# Patient Record
Sex: Male | Born: 1950 | Race: White | Hispanic: No | Marital: Married | Smoking: Former smoker
Health system: Southern US, Community
[De-identification: ages and names within clinical notes are randomized; demographics above are authoritative.]

## PROBLEM LIST (undated history)

## (undated) DIAGNOSIS — E119 Type 2 diabetes mellitus without complications: Secondary | ICD-10-CM

## (undated) DIAGNOSIS — E039 Hypothyroidism, unspecified: Secondary | ICD-10-CM

## (undated) DIAGNOSIS — F419 Anxiety disorder, unspecified: Secondary | ICD-10-CM

## (undated) DIAGNOSIS — M109 Gout, unspecified: Secondary | ICD-10-CM

## (undated) DIAGNOSIS — E7800 Pure hypercholesterolemia, unspecified: Secondary | ICD-10-CM

## (undated) DIAGNOSIS — G629 Polyneuropathy, unspecified: Secondary | ICD-10-CM

## (undated) HISTORY — PX: ADRENALECTOMY: SHX876

---

## 2012-04-28 DIAGNOSIS — I10 Essential (primary) hypertension: Secondary | ICD-10-CM

## 2012-04-28 DIAGNOSIS — N183 Chronic kidney disease, stage 3 unspecified: Secondary | ICD-10-CM

## 2012-04-28 DIAGNOSIS — D62 Acute posthemorrhagic anemia: Secondary | ICD-10-CM

## 2012-04-28 DIAGNOSIS — K279 Peptic ulcer, site unspecified, unspecified as acute or chronic, without hemorrhage or perforation: Secondary | ICD-10-CM

## 2012-04-28 HISTORY — DX: Chronic kidney disease, stage 3 unspecified: N18.30

## 2012-04-28 HISTORY — DX: Peptic ulcer, site unspecified, unspecified as acute or chronic, without hemorrhage or perforation: K27.9

## 2012-04-28 HISTORY — DX: Essential (primary) hypertension: I10

## 2012-04-28 HISTORY — DX: Acute posthemorrhagic anemia: D62

## 2012-09-12 DIAGNOSIS — E8729 Other acidosis: Secondary | ICD-10-CM | POA: Insufficient documentation

## 2012-09-12 DIAGNOSIS — E872 Acidosis: Secondary | ICD-10-CM | POA: Insufficient documentation

## 2012-09-12 DIAGNOSIS — D62 Acute posthemorrhagic anemia: Secondary | ICD-10-CM | POA: Insufficient documentation

## 2012-09-12 DIAGNOSIS — R000 Tachycardia, unspecified: Secondary | ICD-10-CM | POA: Insufficient documentation

## 2012-09-12 DIAGNOSIS — R197 Diarrhea, unspecified: Secondary | ICD-10-CM | POA: Insufficient documentation

## 2012-09-16 DIAGNOSIS — K279 Peptic ulcer, site unspecified, unspecified as acute or chronic, without hemorrhage or perforation: Secondary | ICD-10-CM | POA: Insufficient documentation

## 2012-09-16 DIAGNOSIS — R319 Hematuria, unspecified: Secondary | ICD-10-CM | POA: Insufficient documentation

## 2016-03-12 DIAGNOSIS — E1140 Type 2 diabetes mellitus with diabetic neuropathy, unspecified: Secondary | ICD-10-CM | POA: Diagnosis not present

## 2016-03-12 DIAGNOSIS — E119 Type 2 diabetes mellitus without complications: Secondary | ICD-10-CM | POA: Diagnosis not present

## 2016-06-09 DIAGNOSIS — E119 Type 2 diabetes mellitus without complications: Secondary | ICD-10-CM | POA: Diagnosis not present

## 2016-06-09 DIAGNOSIS — Z0000 Encounter for general adult medical examination without abnormal findings: Secondary | ICD-10-CM | POA: Diagnosis not present

## 2016-07-23 DIAGNOSIS — N183 Chronic kidney disease, stage 3 (moderate): Secondary | ICD-10-CM | POA: Diagnosis not present

## 2016-09-01 DIAGNOSIS — E039 Hypothyroidism, unspecified: Secondary | ICD-10-CM | POA: Diagnosis not present

## 2016-09-01 DIAGNOSIS — E785 Hyperlipidemia, unspecified: Secondary | ICD-10-CM | POA: Diagnosis not present

## 2016-09-01 DIAGNOSIS — I10 Essential (primary) hypertension: Secondary | ICD-10-CM | POA: Diagnosis not present

## 2016-09-01 DIAGNOSIS — D696 Thrombocytopenia, unspecified: Secondary | ICD-10-CM | POA: Diagnosis not present

## 2016-09-01 DIAGNOSIS — E119 Type 2 diabetes mellitus without complications: Secondary | ICD-10-CM | POA: Diagnosis not present

## 2016-09-29 DIAGNOSIS — R32 Unspecified urinary incontinence: Secondary | ICD-10-CM | POA: Diagnosis not present

## 2016-09-29 DIAGNOSIS — J069 Acute upper respiratory infection, unspecified: Secondary | ICD-10-CM | POA: Diagnosis not present

## 2016-10-10 DIAGNOSIS — N3941 Urge incontinence: Secondary | ICD-10-CM | POA: Diagnosis not present

## 2016-10-10 DIAGNOSIS — I10 Essential (primary) hypertension: Secondary | ICD-10-CM | POA: Diagnosis not present

## 2016-10-10 DIAGNOSIS — Z87891 Personal history of nicotine dependence: Secondary | ICD-10-CM | POA: Diagnosis not present

## 2016-10-10 DIAGNOSIS — N183 Chronic kidney disease, stage 3 (moderate): Secondary | ICD-10-CM | POA: Diagnosis not present

## 2016-10-10 DIAGNOSIS — Z7982 Long term (current) use of aspirin: Secondary | ICD-10-CM | POA: Diagnosis not present

## 2016-10-10 DIAGNOSIS — E785 Hyperlipidemia, unspecified: Secondary | ICD-10-CM | POA: Diagnosis not present

## 2016-10-10 DIAGNOSIS — Z6834 Body mass index (BMI) 34.0-34.9, adult: Secondary | ICD-10-CM | POA: Diagnosis not present

## 2016-10-10 DIAGNOSIS — I129 Hypertensive chronic kidney disease with stage 1 through stage 4 chronic kidney disease, or unspecified chronic kidney disease: Secondary | ICD-10-CM | POA: Diagnosis not present

## 2016-10-10 DIAGNOSIS — E669 Obesity, unspecified: Secondary | ICD-10-CM | POA: Diagnosis not present

## 2016-10-10 DIAGNOSIS — E119 Type 2 diabetes mellitus without complications: Secondary | ICD-10-CM | POA: Diagnosis not present

## 2016-10-10 DIAGNOSIS — E039 Hypothyroidism, unspecified: Secondary | ICD-10-CM | POA: Diagnosis not present

## 2016-10-10 DIAGNOSIS — Z79899 Other long term (current) drug therapy: Secondary | ICD-10-CM | POA: Diagnosis not present

## 2016-10-10 DIAGNOSIS — E1142 Type 2 diabetes mellitus with diabetic polyneuropathy: Secondary | ICD-10-CM | POA: Diagnosis not present

## 2016-10-10 DIAGNOSIS — Z794 Long term (current) use of insulin: Secondary | ICD-10-CM | POA: Diagnosis not present

## 2017-03-13 DIAGNOSIS — Z23 Encounter for immunization: Secondary | ICD-10-CM | POA: Diagnosis not present

## 2017-07-07 DIAGNOSIS — Z0000 Encounter for general adult medical examination without abnormal findings: Secondary | ICD-10-CM | POA: Diagnosis not present

## 2017-07-07 DIAGNOSIS — E785 Hyperlipidemia, unspecified: Secondary | ICD-10-CM | POA: Diagnosis not present

## 2017-07-07 DIAGNOSIS — I10 Essential (primary) hypertension: Secondary | ICD-10-CM | POA: Diagnosis not present

## 2017-07-07 DIAGNOSIS — R5383 Other fatigue: Secondary | ICD-10-CM | POA: Diagnosis not present

## 2017-07-07 DIAGNOSIS — Z6834 Body mass index (BMI) 34.0-34.9, adult: Secondary | ICD-10-CM | POA: Diagnosis not present

## 2017-07-07 DIAGNOSIS — H6121 Impacted cerumen, right ear: Secondary | ICD-10-CM | POA: Diagnosis not present

## 2017-07-07 DIAGNOSIS — Z125 Encounter for screening for malignant neoplasm of prostate: Secondary | ICD-10-CM | POA: Diagnosis not present

## 2017-07-07 DIAGNOSIS — E039 Hypothyroidism, unspecified: Secondary | ICD-10-CM | POA: Diagnosis not present

## 2017-07-07 DIAGNOSIS — F321 Major depressive disorder, single episode, moderate: Secondary | ICD-10-CM | POA: Diagnosis not present

## 2017-07-07 DIAGNOSIS — M109 Gout, unspecified: Secondary | ICD-10-CM | POA: Diagnosis not present

## 2017-07-07 DIAGNOSIS — E1142 Type 2 diabetes mellitus with diabetic polyneuropathy: Secondary | ICD-10-CM | POA: Diagnosis not present

## 2017-07-29 DIAGNOSIS — E291 Testicular hypofunction: Secondary | ICD-10-CM | POA: Diagnosis not present

## 2017-08-05 DIAGNOSIS — R05 Cough: Secondary | ICD-10-CM | POA: Diagnosis not present

## 2017-08-13 DIAGNOSIS — Z0000 Encounter for general adult medical examination without abnormal findings: Secondary | ICD-10-CM | POA: Diagnosis not present

## 2017-08-13 DIAGNOSIS — J209 Acute bronchitis, unspecified: Secondary | ICD-10-CM | POA: Diagnosis not present

## 2017-08-28 DIAGNOSIS — R609 Edema, unspecified: Secondary | ICD-10-CM | POA: Diagnosis not present

## 2017-08-28 DIAGNOSIS — M7661 Achilles tendinitis, right leg: Secondary | ICD-10-CM | POA: Diagnosis not present

## 2017-08-28 DIAGNOSIS — M7662 Achilles tendinitis, left leg: Secondary | ICD-10-CM | POA: Diagnosis not present

## 2017-08-28 DIAGNOSIS — E785 Hyperlipidemia, unspecified: Secondary | ICD-10-CM | POA: Diagnosis not present

## 2017-09-17 DIAGNOSIS — M7989 Other specified soft tissue disorders: Secondary | ICD-10-CM | POA: Diagnosis not present

## 2017-09-17 DIAGNOSIS — M7661 Achilles tendinitis, right leg: Secondary | ICD-10-CM | POA: Diagnosis not present

## 2017-09-17 DIAGNOSIS — M7662 Achilles tendinitis, left leg: Secondary | ICD-10-CM | POA: Diagnosis not present

## 2017-09-28 DIAGNOSIS — S86092A Other specified injury of left Achilles tendon, initial encounter: Secondary | ICD-10-CM | POA: Diagnosis not present

## 2017-09-28 DIAGNOSIS — S86091A Other specified injury of right Achilles tendon, initial encounter: Secondary | ICD-10-CM | POA: Diagnosis not present

## 2017-10-01 DIAGNOSIS — S86091A Other specified injury of right Achilles tendon, initial encounter: Secondary | ICD-10-CM | POA: Diagnosis not present

## 2017-10-01 DIAGNOSIS — S86092A Other specified injury of left Achilles tendon, initial encounter: Secondary | ICD-10-CM | POA: Diagnosis not present

## 2017-10-05 DIAGNOSIS — S86091A Other specified injury of right Achilles tendon, initial encounter: Secondary | ICD-10-CM | POA: Diagnosis not present

## 2017-10-05 DIAGNOSIS — S86092A Other specified injury of left Achilles tendon, initial encounter: Secondary | ICD-10-CM | POA: Diagnosis not present

## 2017-10-08 DIAGNOSIS — S86091A Other specified injury of right Achilles tendon, initial encounter: Secondary | ICD-10-CM | POA: Diagnosis not present

## 2017-10-08 DIAGNOSIS — S86092A Other specified injury of left Achilles tendon, initial encounter: Secondary | ICD-10-CM | POA: Diagnosis not present

## 2017-10-12 DIAGNOSIS — S86091A Other specified injury of right Achilles tendon, initial encounter: Secondary | ICD-10-CM | POA: Diagnosis not present

## 2017-10-12 DIAGNOSIS — S86092A Other specified injury of left Achilles tendon, initial encounter: Secondary | ICD-10-CM | POA: Diagnosis not present

## 2017-10-15 DIAGNOSIS — E291 Testicular hypofunction: Secondary | ICD-10-CM | POA: Diagnosis not present

## 2017-10-15 DIAGNOSIS — S86092A Other specified injury of left Achilles tendon, initial encounter: Secondary | ICD-10-CM | POA: Diagnosis not present

## 2017-10-15 DIAGNOSIS — M7661 Achilles tendinitis, right leg: Secondary | ICD-10-CM | POA: Diagnosis not present

## 2017-10-15 DIAGNOSIS — E1165 Type 2 diabetes mellitus with hyperglycemia: Secondary | ICD-10-CM | POA: Diagnosis not present

## 2017-10-15 DIAGNOSIS — S86091A Other specified injury of right Achilles tendon, initial encounter: Secondary | ICD-10-CM | POA: Diagnosis not present

## 2017-10-15 DIAGNOSIS — I10 Essential (primary) hypertension: Secondary | ICD-10-CM | POA: Diagnosis not present

## 2017-10-15 DIAGNOSIS — E039 Hypothyroidism, unspecified: Secondary | ICD-10-CM | POA: Diagnosis not present

## 2017-10-15 DIAGNOSIS — E1142 Type 2 diabetes mellitus with diabetic polyneuropathy: Secondary | ICD-10-CM | POA: Diagnosis not present

## 2017-10-15 DIAGNOSIS — Z7984 Long term (current) use of oral hypoglycemic drugs: Secondary | ICD-10-CM | POA: Diagnosis not present

## 2017-10-15 DIAGNOSIS — E785 Hyperlipidemia, unspecified: Secondary | ICD-10-CM | POA: Diagnosis not present

## 2017-10-19 DIAGNOSIS — S86091A Other specified injury of right Achilles tendon, initial encounter: Secondary | ICD-10-CM | POA: Diagnosis not present

## 2017-10-19 DIAGNOSIS — S86092A Other specified injury of left Achilles tendon, initial encounter: Secondary | ICD-10-CM | POA: Diagnosis not present

## 2017-10-20 DIAGNOSIS — E1142 Type 2 diabetes mellitus with diabetic polyneuropathy: Secondary | ICD-10-CM | POA: Diagnosis not present

## 2017-10-20 DIAGNOSIS — M7661 Achilles tendinitis, right leg: Secondary | ICD-10-CM | POA: Diagnosis not present

## 2017-10-20 DIAGNOSIS — E291 Testicular hypofunction: Secondary | ICD-10-CM | POA: Diagnosis not present

## 2017-10-20 DIAGNOSIS — E349 Endocrine disorder, unspecified: Secondary | ICD-10-CM | POA: Diagnosis not present

## 2017-10-20 DIAGNOSIS — E785 Hyperlipidemia, unspecified: Secondary | ICD-10-CM | POA: Diagnosis not present

## 2017-10-20 DIAGNOSIS — E039 Hypothyroidism, unspecified: Secondary | ICD-10-CM | POA: Diagnosis not present

## 2017-10-20 DIAGNOSIS — I10 Essential (primary) hypertension: Secondary | ICD-10-CM | POA: Diagnosis not present

## 2017-10-20 DIAGNOSIS — Z7984 Long term (current) use of oral hypoglycemic drugs: Secondary | ICD-10-CM | POA: Diagnosis not present

## 2017-10-20 DIAGNOSIS — R609 Edema, unspecified: Secondary | ICD-10-CM | POA: Diagnosis not present

## 2017-10-20 DIAGNOSIS — M7662 Achilles tendinitis, left leg: Secondary | ICD-10-CM | POA: Diagnosis not present

## 2017-10-20 DIAGNOSIS — Z6834 Body mass index (BMI) 34.0-34.9, adult: Secondary | ICD-10-CM | POA: Diagnosis not present

## 2017-10-22 DIAGNOSIS — S86091A Other specified injury of right Achilles tendon, initial encounter: Secondary | ICD-10-CM | POA: Diagnosis not present

## 2017-10-22 DIAGNOSIS — S86092A Other specified injury of left Achilles tendon, initial encounter: Secondary | ICD-10-CM | POA: Diagnosis not present

## 2017-10-27 DIAGNOSIS — S86091A Other specified injury of right Achilles tendon, initial encounter: Secondary | ICD-10-CM | POA: Diagnosis not present

## 2017-10-27 DIAGNOSIS — S86092A Other specified injury of left Achilles tendon, initial encounter: Secondary | ICD-10-CM | POA: Diagnosis not present

## 2017-11-02 DIAGNOSIS — S86091A Other specified injury of right Achilles tendon, initial encounter: Secondary | ICD-10-CM | POA: Diagnosis not present

## 2017-11-02 DIAGNOSIS — S86092A Other specified injury of left Achilles tendon, initial encounter: Secondary | ICD-10-CM | POA: Diagnosis not present

## 2017-11-06 DIAGNOSIS — S86091A Other specified injury of right Achilles tendon, initial encounter: Secondary | ICD-10-CM | POA: Diagnosis not present

## 2017-11-06 DIAGNOSIS — S86092A Other specified injury of left Achilles tendon, initial encounter: Secondary | ICD-10-CM | POA: Diagnosis not present

## 2017-11-10 DIAGNOSIS — S86091A Other specified injury of right Achilles tendon, initial encounter: Secondary | ICD-10-CM | POA: Diagnosis not present

## 2017-11-10 DIAGNOSIS — S86092A Other specified injury of left Achilles tendon, initial encounter: Secondary | ICD-10-CM | POA: Diagnosis not present

## 2017-11-12 DIAGNOSIS — S86091A Other specified injury of right Achilles tendon, initial encounter: Secondary | ICD-10-CM | POA: Diagnosis not present

## 2017-11-12 DIAGNOSIS — S86092A Other specified injury of left Achilles tendon, initial encounter: Secondary | ICD-10-CM | POA: Diagnosis not present

## 2017-11-17 DIAGNOSIS — S86091A Other specified injury of right Achilles tendon, initial encounter: Secondary | ICD-10-CM | POA: Diagnosis not present

## 2017-11-17 DIAGNOSIS — S86092A Other specified injury of left Achilles tendon, initial encounter: Secondary | ICD-10-CM | POA: Diagnosis not present

## 2017-11-19 DIAGNOSIS — S86091A Other specified injury of right Achilles tendon, initial encounter: Secondary | ICD-10-CM | POA: Diagnosis not present

## 2017-11-19 DIAGNOSIS — S86092A Other specified injury of left Achilles tendon, initial encounter: Secondary | ICD-10-CM | POA: Diagnosis not present

## 2017-11-24 DIAGNOSIS — S86091A Other specified injury of right Achilles tendon, initial encounter: Secondary | ICD-10-CM | POA: Diagnosis not present

## 2017-11-24 DIAGNOSIS — S86092A Other specified injury of left Achilles tendon, initial encounter: Secondary | ICD-10-CM | POA: Diagnosis not present

## 2017-11-26 DIAGNOSIS — S86092A Other specified injury of left Achilles tendon, initial encounter: Secondary | ICD-10-CM | POA: Diagnosis not present

## 2017-11-26 DIAGNOSIS — S86091A Other specified injury of right Achilles tendon, initial encounter: Secondary | ICD-10-CM | POA: Diagnosis not present

## 2017-12-01 DIAGNOSIS — S86091A Other specified injury of right Achilles tendon, initial encounter: Secondary | ICD-10-CM | POA: Diagnosis not present

## 2017-12-01 DIAGNOSIS — S86092A Other specified injury of left Achilles tendon, initial encounter: Secondary | ICD-10-CM | POA: Diagnosis not present

## 2017-12-03 DIAGNOSIS — S86091A Other specified injury of right Achilles tendon, initial encounter: Secondary | ICD-10-CM | POA: Diagnosis not present

## 2017-12-03 DIAGNOSIS — S86092A Other specified injury of left Achilles tendon, initial encounter: Secondary | ICD-10-CM | POA: Diagnosis not present

## 2017-12-08 DIAGNOSIS — S86092A Other specified injury of left Achilles tendon, initial encounter: Secondary | ICD-10-CM | POA: Diagnosis not present

## 2017-12-08 DIAGNOSIS — S86091A Other specified injury of right Achilles tendon, initial encounter: Secondary | ICD-10-CM | POA: Diagnosis not present

## 2017-12-10 DIAGNOSIS — S86091A Other specified injury of right Achilles tendon, initial encounter: Secondary | ICD-10-CM | POA: Diagnosis not present

## 2017-12-10 DIAGNOSIS — S86092A Other specified injury of left Achilles tendon, initial encounter: Secondary | ICD-10-CM | POA: Diagnosis not present

## 2018-01-21 DIAGNOSIS — E119 Type 2 diabetes mellitus without complications: Secondary | ICD-10-CM | POA: Diagnosis not present

## 2018-01-21 DIAGNOSIS — H25013 Cortical age-related cataract, bilateral: Secondary | ICD-10-CM | POA: Diagnosis not present

## 2018-01-21 DIAGNOSIS — H25043 Posterior subcapsular polar age-related cataract, bilateral: Secondary | ICD-10-CM | POA: Diagnosis not present

## 2018-01-21 DIAGNOSIS — H35363 Drusen (degenerative) of macula, bilateral: Secondary | ICD-10-CM | POA: Diagnosis not present

## 2018-03-01 DIAGNOSIS — Z23 Encounter for immunization: Secondary | ICD-10-CM | POA: Diagnosis not present

## 2018-03-16 DIAGNOSIS — Z23 Encounter for immunization: Secondary | ICD-10-CM | POA: Diagnosis not present

## 2018-05-03 DIAGNOSIS — K210 Gastro-esophageal reflux disease with esophagitis: Secondary | ICD-10-CM | POA: Diagnosis not present

## 2018-05-03 DIAGNOSIS — I10 Essential (primary) hypertension: Secondary | ICD-10-CM | POA: Diagnosis not present

## 2018-05-03 DIAGNOSIS — E1140 Type 2 diabetes mellitus with diabetic neuropathy, unspecified: Secondary | ICD-10-CM | POA: Diagnosis not present

## 2018-05-03 DIAGNOSIS — E039 Hypothyroidism, unspecified: Secondary | ICD-10-CM | POA: Diagnosis not present

## 2018-05-03 DIAGNOSIS — F321 Major depressive disorder, single episode, moderate: Secondary | ICD-10-CM | POA: Diagnosis not present

## 2018-05-03 DIAGNOSIS — E1165 Type 2 diabetes mellitus with hyperglycemia: Secondary | ICD-10-CM | POA: Diagnosis not present

## 2018-05-03 DIAGNOSIS — E785 Hyperlipidemia, unspecified: Secondary | ICD-10-CM | POA: Diagnosis not present

## 2018-05-03 DIAGNOSIS — N183 Chronic kidney disease, stage 3 (moderate): Secondary | ICD-10-CM | POA: Diagnosis not present

## 2018-05-03 DIAGNOSIS — Z7984 Long term (current) use of oral hypoglycemic drugs: Secondary | ICD-10-CM | POA: Diagnosis not present

## 2018-05-03 DIAGNOSIS — E1142 Type 2 diabetes mellitus with diabetic polyneuropathy: Secondary | ICD-10-CM | POA: Diagnosis not present

## 2018-05-03 DIAGNOSIS — G4700 Insomnia, unspecified: Secondary | ICD-10-CM | POA: Diagnosis not present

## 2018-11-01 DIAGNOSIS — E291 Testicular hypofunction: Secondary | ICD-10-CM | POA: Diagnosis not present

## 2018-11-01 DIAGNOSIS — I10 Essential (primary) hypertension: Secondary | ICD-10-CM | POA: Diagnosis not present

## 2018-11-01 DIAGNOSIS — E1142 Type 2 diabetes mellitus with diabetic polyneuropathy: Secondary | ICD-10-CM | POA: Diagnosis not present

## 2018-11-01 DIAGNOSIS — E039 Hypothyroidism, unspecified: Secondary | ICD-10-CM | POA: Diagnosis not present

## 2018-11-01 DIAGNOSIS — E785 Hyperlipidemia, unspecified: Secondary | ICD-10-CM | POA: Diagnosis not present

## 2018-11-02 DIAGNOSIS — E291 Testicular hypofunction: Secondary | ICD-10-CM | POA: Diagnosis not present

## 2018-11-02 DIAGNOSIS — E1165 Type 2 diabetes mellitus with hyperglycemia: Secondary | ICD-10-CM | POA: Diagnosis not present

## 2018-11-02 DIAGNOSIS — I10 Essential (primary) hypertension: Secondary | ICD-10-CM | POA: Diagnosis not present

## 2018-11-02 DIAGNOSIS — E039 Hypothyroidism, unspecified: Secondary | ICD-10-CM | POA: Diagnosis not present

## 2018-11-02 DIAGNOSIS — E785 Hyperlipidemia, unspecified: Secondary | ICD-10-CM | POA: Diagnosis not present

## 2018-11-02 DIAGNOSIS — E1142 Type 2 diabetes mellitus with diabetic polyneuropathy: Secondary | ICD-10-CM | POA: Diagnosis not present

## 2018-11-11 DIAGNOSIS — E291 Testicular hypofunction: Secondary | ICD-10-CM | POA: Diagnosis not present

## 2018-12-13 DIAGNOSIS — E291 Testicular hypofunction: Secondary | ICD-10-CM | POA: Diagnosis not present

## 2019-01-13 DIAGNOSIS — Z23 Encounter for immunization: Secondary | ICD-10-CM | POA: Diagnosis not present

## 2019-01-13 DIAGNOSIS — E291 Testicular hypofunction: Secondary | ICD-10-CM | POA: Diagnosis not present

## 2019-02-14 DIAGNOSIS — E291 Testicular hypofunction: Secondary | ICD-10-CM | POA: Diagnosis not present

## 2019-02-27 DIAGNOSIS — Z20828 Contact with and (suspected) exposure to other viral communicable diseases: Secondary | ICD-10-CM | POA: Diagnosis not present

## 2019-02-27 DIAGNOSIS — R05 Cough: Secondary | ICD-10-CM | POA: Diagnosis not present

## 2019-02-27 DIAGNOSIS — R0981 Nasal congestion: Secondary | ICD-10-CM | POA: Diagnosis not present

## 2019-03-17 DIAGNOSIS — E291 Testicular hypofunction: Secondary | ICD-10-CM | POA: Diagnosis not present

## 2019-03-31 DIAGNOSIS — E291 Testicular hypofunction: Secondary | ICD-10-CM | POA: Diagnosis not present

## 2019-05-02 DIAGNOSIS — E1140 Type 2 diabetes mellitus with diabetic neuropathy, unspecified: Secondary | ICD-10-CM | POA: Diagnosis not present

## 2019-05-02 DIAGNOSIS — I10 Essential (primary) hypertension: Secondary | ICD-10-CM | POA: Diagnosis not present

## 2019-05-02 DIAGNOSIS — F321 Major depressive disorder, single episode, moderate: Secondary | ICD-10-CM | POA: Diagnosis not present

## 2019-05-02 DIAGNOSIS — E785 Hyperlipidemia, unspecified: Secondary | ICD-10-CM | POA: Diagnosis not present

## 2019-05-02 DIAGNOSIS — Z794 Long term (current) use of insulin: Secondary | ICD-10-CM | POA: Diagnosis not present

## 2019-05-02 DIAGNOSIS — G4700 Insomnia, unspecified: Secondary | ICD-10-CM | POA: Diagnosis not present

## 2019-05-02 DIAGNOSIS — E291 Testicular hypofunction: Secondary | ICD-10-CM | POA: Diagnosis not present

## 2019-05-02 DIAGNOSIS — E1122 Type 2 diabetes mellitus with diabetic chronic kidney disease: Secondary | ICD-10-CM | POA: Diagnosis not present

## 2019-05-20 DIAGNOSIS — E291 Testicular hypofunction: Secondary | ICD-10-CM | POA: Diagnosis not present

## 2019-06-03 DIAGNOSIS — M79604 Pain in right leg: Secondary | ICD-10-CM | POA: Diagnosis not present

## 2019-06-03 DIAGNOSIS — Z1211 Encounter for screening for malignant neoplasm of colon: Secondary | ICD-10-CM | POA: Diagnosis not present

## 2019-06-08 ENCOUNTER — Emergency Department (HOSPITAL_BASED_OUTPATIENT_CLINIC_OR_DEPARTMENT_OTHER): Payer: Medicare Other

## 2019-06-08 ENCOUNTER — Other Ambulatory Visit (HOSPITAL_BASED_OUTPATIENT_CLINIC_OR_DEPARTMENT_OTHER): Payer: Self-pay | Admitting: Family Medicine

## 2019-06-08 ENCOUNTER — Ambulatory Visit (HOSPITAL_BASED_OUTPATIENT_CLINIC_OR_DEPARTMENT_OTHER)
Admission: RE | Admit: 2019-06-08 | Discharge: 2019-06-08 | Disposition: A | Discharge status: Home / Self Care | Payer: Medicare Other | Source: Ambulatory Visit | Attending: Family Medicine | Admitting: Family Medicine

## 2019-06-08 ENCOUNTER — Encounter (HOSPITAL_BASED_OUTPATIENT_CLINIC_OR_DEPARTMENT_OTHER): Payer: Self-pay

## 2019-06-08 ENCOUNTER — Inpatient Hospital Stay (HOSPITAL_BASED_OUTPATIENT_CLINIC_OR_DEPARTMENT_OTHER)
Admission: EM | Admit: 2019-06-08 | Discharge: 2019-06-10 | DRG: 300 | Disposition: A | Discharge status: Home / Self Care | Payer: Medicare Other | Attending: Family Medicine | Admitting: Family Medicine

## 2019-06-08 ENCOUNTER — Other Ambulatory Visit: Payer: Self-pay

## 2019-06-08 DIAGNOSIS — Z885 Allergy status to narcotic agent status: Secondary | ICD-10-CM

## 2019-06-08 DIAGNOSIS — I10 Essential (primary) hypertension: Secondary | ICD-10-CM | POA: Diagnosis present

## 2019-06-08 DIAGNOSIS — Z20822 Contact with and (suspected) exposure to covid-19: Secondary | ICD-10-CM | POA: Diagnosis present

## 2019-06-08 DIAGNOSIS — Z9089 Acquired absence of other organs: Secondary | ICD-10-CM | POA: Diagnosis not present

## 2019-06-08 DIAGNOSIS — M79604 Pain in right leg: Secondary | ICD-10-CM | POA: Diagnosis not present

## 2019-06-08 DIAGNOSIS — E039 Hypothyroidism, unspecified: Secondary | ICD-10-CM | POA: Diagnosis present

## 2019-06-08 DIAGNOSIS — E785 Hyperlipidemia, unspecified: Secondary | ICD-10-CM | POA: Diagnosis present

## 2019-06-08 DIAGNOSIS — E1140 Type 2 diabetes mellitus with diabetic neuropathy, unspecified: Secondary | ICD-10-CM | POA: Diagnosis present

## 2019-06-08 DIAGNOSIS — D582 Other hemoglobinopathies: Secondary | ICD-10-CM | POA: Diagnosis present

## 2019-06-08 DIAGNOSIS — L97519 Non-pressure chronic ulcer of other part of right foot with unspecified severity: Secondary | ICD-10-CM | POA: Diagnosis present

## 2019-06-08 DIAGNOSIS — N1831 Chronic kidney disease, stage 3a: Secondary | ICD-10-CM | POA: Diagnosis present

## 2019-06-08 DIAGNOSIS — N183 Chronic kidney disease, stage 3 unspecified: Secondary | ICD-10-CM | POA: Diagnosis present

## 2019-06-08 DIAGNOSIS — Z832 Family history of diseases of the blood and blood-forming organs and certain disorders involving the immune mechanism: Secondary | ICD-10-CM

## 2019-06-08 DIAGNOSIS — Z7982 Long term (current) use of aspirin: Secondary | ICD-10-CM

## 2019-06-08 DIAGNOSIS — I82411 Acute embolism and thrombosis of right femoral vein: Secondary | ICD-10-CM | POA: Diagnosis not present

## 2019-06-08 DIAGNOSIS — Z6834 Body mass index (BMI) 34.0-34.9, adult: Secondary | ICD-10-CM | POA: Diagnosis not present

## 2019-06-08 DIAGNOSIS — E669 Obesity, unspecified: Secondary | ICD-10-CM | POA: Diagnosis present

## 2019-06-08 DIAGNOSIS — E11621 Type 2 diabetes mellitus with foot ulcer: Secondary | ICD-10-CM | POA: Diagnosis present

## 2019-06-08 DIAGNOSIS — B353 Tinea pedis: Secondary | ICD-10-CM | POA: Diagnosis not present

## 2019-06-08 DIAGNOSIS — Z833 Family history of diabetes mellitus: Secondary | ICD-10-CM

## 2019-06-08 DIAGNOSIS — E1122 Type 2 diabetes mellitus with diabetic chronic kidney disease: Secondary | ICD-10-CM | POA: Diagnosis present

## 2019-06-08 DIAGNOSIS — E11628 Type 2 diabetes mellitus with other skin complications: Secondary | ICD-10-CM | POA: Diagnosis present

## 2019-06-08 DIAGNOSIS — Z03818 Encounter for observation for suspected exposure to other biological agents ruled out: Secondary | ICD-10-CM | POA: Diagnosis not present

## 2019-06-08 DIAGNOSIS — I129 Hypertensive chronic kidney disease with stage 1 through stage 4 chronic kidney disease, or unspecified chronic kidney disease: Secondary | ICD-10-CM | POA: Diagnosis present

## 2019-06-08 DIAGNOSIS — Z87891 Personal history of nicotine dependence: Secondary | ICD-10-CM | POA: Diagnosis not present

## 2019-06-08 DIAGNOSIS — M109 Gout, unspecified: Secondary | ICD-10-CM | POA: Diagnosis present

## 2019-06-08 DIAGNOSIS — E1129 Type 2 diabetes mellitus with other diabetic kidney complication: Secondary | ICD-10-CM | POA: Diagnosis not present

## 2019-06-08 DIAGNOSIS — Z7984 Long term (current) use of oral hypoglycemic drugs: Secondary | ICD-10-CM

## 2019-06-08 DIAGNOSIS — M79671 Pain in right foot: Secondary | ICD-10-CM | POA: Diagnosis not present

## 2019-06-08 DIAGNOSIS — I824Z1 Acute embolism and thrombosis of unspecified deep veins of right distal lower extremity: Secondary | ICD-10-CM | POA: Diagnosis present

## 2019-06-08 DIAGNOSIS — L0390 Cellulitis, unspecified: Secondary | ICD-10-CM | POA: Diagnosis present

## 2019-06-08 DIAGNOSIS — L03115 Cellulitis of right lower limb: Secondary | ICD-10-CM | POA: Diagnosis not present

## 2019-06-08 DIAGNOSIS — D696 Thrombocytopenia, unspecified: Secondary | ICD-10-CM | POA: Diagnosis present

## 2019-06-08 DIAGNOSIS — E876 Hypokalemia: Secondary | ICD-10-CM | POA: Diagnosis not present

## 2019-06-08 DIAGNOSIS — L089 Local infection of the skin and subcutaneous tissue, unspecified: Secondary | ICD-10-CM | POA: Diagnosis not present

## 2019-06-08 DIAGNOSIS — F411 Generalized anxiety disorder: Secondary | ICD-10-CM | POA: Diagnosis present

## 2019-06-08 DIAGNOSIS — I82431 Acute embolism and thrombosis of right popliteal vein: Secondary | ICD-10-CM | POA: Diagnosis not present

## 2019-06-08 DIAGNOSIS — Z79899 Other long term (current) drug therapy: Secondary | ICD-10-CM

## 2019-06-08 DIAGNOSIS — Z79890 Hormone replacement therapy (postmenopausal): Secondary | ICD-10-CM

## 2019-06-08 HISTORY — DX: Type 2 diabetes mellitus without complications: E11.9

## 2019-06-08 HISTORY — DX: Polyneuropathy, unspecified: G62.9

## 2019-06-08 HISTORY — DX: Anxiety disorder, unspecified: F41.9

## 2019-06-08 HISTORY — DX: Gout, unspecified: M10.9

## 2019-06-08 HISTORY — DX: Pure hypercholesterolemia, unspecified: E78.00

## 2019-06-08 LAB — CBC WITH DIFFERENTIAL/PLATELET
Abs Immature Granulocytes: 0.03 10*3/uL (ref 0.00–0.07)
Basophils Absolute: 0 10*3/uL (ref 0.0–0.1)
Basophils Relative: 1 %
Eosinophils Absolute: 0.1 10*3/uL (ref 0.0–0.5)
Eosinophils Relative: 2 %
HCT: 53.7 % — ABNORMAL HIGH (ref 39.0–52.0)
Hemoglobin: 18.2 g/dL — ABNORMAL HIGH (ref 13.0–17.0)
Immature Granulocytes: 0 %
Lymphocytes Relative: 15 %
Lymphs Abs: 1.1 10*3/uL (ref 0.7–4.0)
MCH: 32.7 pg (ref 26.0–34.0)
MCHC: 33.9 g/dL (ref 30.0–36.0)
MCV: 96.6 fL (ref 80.0–100.0)
Monocytes Absolute: 0.6 10*3/uL (ref 0.1–1.0)
Monocytes Relative: 9 %
Neutro Abs: 5.2 10*3/uL (ref 1.7–7.7)
Neutrophils Relative %: 73 %
Platelets: 123 10*3/uL — ABNORMAL LOW (ref 150–400)
RBC: 5.56 MIL/uL (ref 4.22–5.81)
RDW: 13.2 % (ref 11.5–15.5)
WBC: 7.1 10*3/uL (ref 4.0–10.5)
nRBC: 0 % (ref 0.0–0.2)

## 2019-06-08 LAB — COMPREHENSIVE METABOLIC PANEL
ALT: 19 U/L (ref 0–44)
AST: 22 U/L (ref 15–41)
Albumin: 3.7 g/dL (ref 3.5–5.0)
Alkaline Phosphatase: 46 U/L (ref 38–126)
Anion gap: 10 (ref 5–15)
BUN: 20 mg/dL (ref 8–23)
CO2: 27 mmol/L (ref 22–32)
Calcium: 9 mg/dL (ref 8.9–10.3)
Chloride: 100 mmol/L (ref 98–111)
Creatinine, Ser: 1.54 mg/dL — ABNORMAL HIGH (ref 0.61–1.24)
GFR calc Af Amer: 53 mL/min — ABNORMAL LOW (ref >60)
GFR calc non Af Amer: 45 mL/min — ABNORMAL LOW (ref >60)
Glucose, Bld: 125 mg/dL — ABNORMAL HIGH (ref 70–99)
Potassium: 3.8 mmol/L (ref 3.5–5.1)
Sodium: 137 mmol/L (ref 135–145)
Total Bilirubin: 1.7 mg/dL — ABNORMAL HIGH (ref 0.3–1.2)
Total Protein: 7.2 g/dL (ref 6.5–8.1)

## 2019-06-08 LAB — RESPIRATORY PANEL BY RT PCR (FLU A&B, COVID)
Influenza A by PCR: NEGATIVE
Influenza B by PCR: NEGATIVE
SARS Coronavirus 2 by RT PCR: NEGATIVE

## 2019-06-08 LAB — LACTIC ACID, PLASMA: Lactic Acid, Venous: 1.7 mmol/L (ref 0.5–1.9)

## 2019-06-08 MED ORDER — PIPERACILLIN-TAZOBACTAM 3.375 G IVPB 30 MIN
3.3750 g | Freq: Once | INTRAVENOUS | Status: AC
  Administered 2019-06-08: 3.375 g via INTRAVENOUS
  Filled 2019-06-08 (×2): qty 50

## 2019-06-08 MED ORDER — HEPARIN (PORCINE) 25000 UT/250ML-% IV SOLN
1800.0000 [IU]/h | INTRAVENOUS | Status: DC
  Administered 2019-06-08 – 2019-06-09 (×2): 1800 [IU]/h via INTRAVENOUS
  Filled 2019-06-08 (×2): qty 250

## 2019-06-08 MED ORDER — HEPARIN BOLUS VIA INFUSION
7000.0000 [IU] | Freq: Once | INTRAVENOUS | Status: AC
  Administered 2019-06-08: 7000 [IU] via INTRAVENOUS

## 2019-06-08 MED ORDER — VANCOMYCIN HCL 1250 MG/250ML IV SOLN
1250.0000 mg | INTRAVENOUS | Status: DC
  Administered 2019-06-09: 1250 mg via INTRAVENOUS
  Filled 2019-06-08 (×2): qty 250

## 2019-06-08 MED ORDER — VANCOMYCIN HCL IN DEXTROSE 1-5 GM/200ML-% IV SOLN
1000.0000 mg | Freq: Once | INTRAVENOUS | Status: AC
  Administered 2019-06-08: 1000 mg via INTRAVENOUS
  Filled 2019-06-08: qty 200

## 2019-06-08 NOTE — ED Triage Notes (Signed)
Pt brought from xray-DVT right LE-NAD-steady gait

## 2019-06-08 NOTE — ED Provider Notes (Signed)
[facility] EMERGENCY DEPARTMENT Provider Note   CSN: [id number] Arrival date & time: 06/08/19  2011     History Chief Complaint  Patient presents with  . DVT right LE    Jonathon [name] is a 69 y.o. male.  The history is provided by the patient and medical records. No language interpreter was used.  Leg Pain Location:  Foot Injury: no   Foot location:  R foot Pain details:    Quality:  Aching   Radiates to:  Does not radiate   Severity:  Moderate   Onset quality:  Gradual   Timing:  Constant   Progression:  Unchanged Chronicity:  New Tetanus status:  Unknown Prior injury to area:  No Relieved by:  Nothing Worsened by:  Nothing Ineffective treatments:  None tried Associated symptoms: no back pain, no fatigue, no fever and no neck pain   Risk factors: obesity        Past Medical History:  Diagnosis Date  . Anxiety   . Diabetes mellitus without complication ([place])   . Gout   . High cholesterol   . Neuropathy   . Thyroid disease     There are no problems to display for this patient.   Past Surgical History:  Procedure Laterality Date  . ADRENALECTOMY         No family history on file.  Social History   Tobacco Use  . Smoking status: Former Research scientist (life sciences)  . Smokeless tobacco: Never Used  Substance Use Topics  . Alcohol use: Yes    Comment: daily  . Drug use: Never    Home Medications Prior to Admission medications   Not on File    Allergies    Morphine and related  Review of Systems   Review of Systems  Constitutional: Negative for chills, diaphoresis, fatigue and fever.  HENT: Negative for congestion.   Respiratory: Negative for cough, chest tightness, shortness of breath and wheezing.   Cardiovascular: Positive for leg swelling. Negative for chest pain and palpitations.  Gastrointestinal: Negative for abdominal pain, constipation, diarrhea, nausea and vomiting.  Genitourinary: Negative for dysuria and flank pain.    Musculoskeletal: Negative for back pain, neck pain and neck stiffness.  Skin: Positive for rash and wound.  Neurological: Negative for light-headedness and headaches.  Psychiatric/Behavioral: Negative for agitation and confusion.  All other systems reviewed and are negative.   Physical Exam Updated Vital Signs BP (!) 164/108 (BP Location: Left Arm)   Pulse 97   Temp 98.7 F (37.1 C) (Oral)   Resp 18   Ht 6' (1.829 m)   Wt 114.3 kg   SpO2 100%   BMI 34.18 kg/m   Physical Exam Vitals and nursing note reviewed.  Constitutional:      General: He is not in acute distress.    Appearance: He is well-developed. He is not ill-appearing, toxic-appearing or diaphoretic.  HENT:     Head: Normocephalic and atraumatic.     Nose: Nose normal.     Mouth/Throat:     Mouth: Mucous membranes are moist.     Pharynx: No oropharyngeal exudate or posterior oropharyngeal erythema.  Eyes:     Conjunctiva/sclera: Conjunctivae normal.  Cardiovascular:     Rate and Rhythm: Normal rate and regular rhythm.     Heart sounds: No murmur.  Pulmonary:     Effort: Pulmonary effort is normal. No respiratory distress.     Breath sounds: Normal breath sounds. No wheezing, rhonchi or rales.  Chest:  Chest wall: No tenderness.  Abdominal:     General: Abdomen is flat.     Palpations: Abdomen is soft.     Tenderness: There is no abdominal tenderness. There is no right CVA tenderness or left CVA tenderness.  Musculoskeletal:        General: Tenderness present. No signs of injury.     Cervical back: Neck supple.     Right lower leg: Edema present.  Skin:    General: Skin is warm and dry.     Capillary Refill: Capillary refill takes less than 2 seconds.     Findings: Erythema and rash present.  Neurological:     General: No focal deficit present.     Mental Status: He is alert.  Psychiatric:        Mood and Affect: Mood normal.            ED Results / Procedures / Treatments   Labs (all  labs ordered are listed, but only abnormal results are displayed) Labs Reviewed  CBC WITH DIFFERENTIAL/PLATELET - Abnormal; Notable for the following components:      Result Value   Hemoglobin 18.2 (*)    HCT 53.7 (*)    Platelets 123 (*)    All other components within normal limits  COMPREHENSIVE METABOLIC PANEL - Abnormal; Notable for the following components:   Glucose, Bld 125 (*)    Creatinine, Ser 1.54 (*)    Total Bilirubin 1.7 (*)    GFR calc non Af Amer 45 (*)    GFR calc Af Amer 53 (*)    All other components within normal limits  RESPIRATORY PANEL BY RT PCR (FLU A&B, COVID)  CULTURE, BLOOD (ROUTINE X 2)  CULTURE, BLOOD (ROUTINE X 2)  LACTIC ACID, PLASMA  LACTIC ACID, PLASMA  CBC  HEPARIN LEVEL (UNFRACTIONATED)    EKG None    Radiology US Venous Img Lower Unilateral Right (DVT)  Addendum Date: 06/08/2019   ADDENDUM REPORT: 06/08/2019 20:06 ADDENDUM: Multiple attempts were made to contact the referring provider office without success. The patient was instructed to go to the emergency room for further evaluation. Electronically Signed   By: Audie Pinto M.D.   On: 06/08/2019 20:06   Result Date: 06/08/2019 CLINICAL DATA:  Right lower extremity pain for 1 week. EXAM: RIGHT LOWER EXTREMITY VENOUS DOPPLER ULTRASOUND TECHNIQUE: Gray-scale sonography with compression, as well as color and duplex ultrasound, were performed to evaluate the deep venous system(s) from the level of the common femoral vein through the popliteal and proximal calf veins. COMPARISON:  None. FINDINGS: VENOUS There is incomplete compression of the distal femoral vein. There is evidence of nonocclusive thrombus on grayscale images in the distal femoral vein. There is normal compressibility of the common femoral, proximal to mid femoral vein, superficial femoral, and popliteal veins, as well as the visualized calf veins. Visualized portions of profunda femoral vein and great saphenous vein unremarkable.  Doppler waveforms show normal direction of venous flow, normal respiratory phasicity and response to augmentation. Limited views of the contralateral common femoral vein are unremarkable. OTHER None. Limitations: none IMPRESSION: Nonocclusive thrombus in the distal right femoral vein. The remainder of the deep venous system appears is patent without thrombus. Electronically Signed: By: Audie Pinto M.D. On: 06/08/2019 19:52   DG Foot Complete Right  Result Date: 06/08/2019 CLINICAL DATA:  Diabetes, right foot infection EXAM: RIGHT FOOT COMPLETE - 3+ VIEW COMPARISON:  None. FINDINGS: Frontal, oblique, lateral views of the right foot are obtained.  No acute displaced fractures. Mild osteoarthritis of the first metatarsophalangeal joint. Diffuse vascular calcifications are identified. The soft tissues are otherwise unremarkable. No radiographic evidence of osteomyelitis. IMPRESSION: 1. No acute or destructive bony lesions. Electronically Signed   By: Randa Ngo M.D.   On: 06/08/2019 21:53    Procedures Procedures (including critical care time)  CRITICAL CARE Performed by: Gwenyth Allegra Glennice Marcos Total critical care time: 35 minutes Critical care time was exclusive of separately billable procedures and treating other patients. Critical care was necessary to treat or prevent imminent or life-threatening deterioration. Critical care was time spent personally by me on the following activities: development of treatment plan with patient and/or surrogate as well as nursing, discussions with consultants, evaluation of patient's response to treatment, examination of patient, obtaining history from patient or surrogate, ordering and performing treatments and interventions, ordering and review of laboratory studies, ordering and review of radiographic studies, pulse oximetry and re-evaluation of patient's condition.   Medications Ordered in ED Medications  heparin ADULT infusion 100 units/mL (25000  units/269mL sodium chloride 0.45%) (1,800 Units/hr Intravenous New Bag/Given 06/08/19 2241)  vancomycin (VANCOREADY) IVPB 1250 mg/250 mL (has no administration in time range)  piperacillin-tazobactam (ZOSYN) IVPB 3.375 g (0 g Intravenous Stopped 06/08/19 2224)  vancomycin (VANCOCIN) IVPB 1000 mg/200 mL premix (0 mg Intravenous Stopped 06/08/19 2335)  heparin bolus via infusion 7,000 Units (7,000 Units Intravenous Bolus from Bag 06/08/19 2242)    ED Course  I have reviewed the triage vital signs and the nursing notes.  Pertinent labs & imaging results that were available during my care of the patient were reviewed by me and considered in my medical decision making (see chart for details).    MDM Rules/Calculators/A&P                      [name] is a 69 y.o. male with a past medical history significant for obesity, thyroid disease, diabetic neuropathy, gout, and anxiety who presents for further evaluation of right leg pain and some swelling.  According to patient, for the last week he has had some pain in his right lateral calf.  He saw his PCP today who ordered a DVT ultrasound as well as ordered a prescription for antibiotics for developing cellulitis on the top of the foot.  He reports that while awaiting the ultrasound to be completed and for the results, the infection and erythema on the top of his foot has now spread past the ankle and up to his mid shin.  He reports no significant fevers, chills, chest pain, shortness of breath, nausea, vomiting however.  He denies any other systemic signs of infection but he does report that yesterday his foot was not red whatsoever and now the redness is spreading up his shin.  He is concerned about the infection given the diabetes and peripheral neuropathy.  Patient's ultrasound performed as an outpatient does reveal acute DVT.  On exam, patient has some tenderness in the right foot and evidence of diabetic wound between his toes.  There is  erythema warmth spreading up the leg but there is no crepitance.  Patient does have palpable pulses and his sensation is at his diminished baseline.  Lungs were clear and chest was nontender.  Abdomen was nontender.  Patient is afebrile.  Clinically I am concerned not only about the patient's DVT to be started on anticoagulation but I am concerned about the rapidly worsening cellulitis.  Given the redness spreading from the toes  to nearly the knee within several hours, I feel he needs IV antibiotics and admission for further management.  X-ray was obtained to rule out osteomyelitis and was negative for bony involvement.  Labs were performed and did not show sepsis.  He will be given IV antibiotics and will be admitted.  He was started on heparin for the DVT as well.  Patient admitted to hospitalist service for further management.      Final Clinical Impression(s) / ED Diagnoses Final diagnoses:  Cellulitis in diabetic foot (HCC)  DVT, lower extremity, distal, acute, right (HCC)     Clinical Impression: 1. Cellulitis in diabetic foot ([place])   2. DVT, lower extremity, distal, acute, right ([place])     Disposition: Admit  This note was prepared with assistance of Dragon voice recognition software. Occasional wrong-word or sound-a-like substitutions may have occurred due to the inherent limitations of voice recognition software.      Starsky Nanna, Gwenyth Allegra, MD 06/09/19 [phone]

## 2019-06-08 NOTE — Progress Notes (Addendum)
ANTICOAGULATION/ANTIBIOTIC CONSULT NOTE   Pharmacy Consult for Heparin and Vancomycin Indication: DVT/ Cellulitis   Allergies  Allergen Reactions  . Morphine And Related Itching    Patient Measurements: Height: 6' (182.9 cm) Weight: 252 lb (114.3 kg) IBW/kg (Calculated) : 77.6 Heparin Dosing Weight: 102.2kg  Vital Signs: Temp: 98.7 F (37.1 C) (02/10 2021) Temp Source: Oral (02/10 2021) BP: 158/104 (02/10 2154) Pulse Rate: 97 (02/10 2154)  Labs: Recent Labs    06/08/19 2128  HGB 18.2*  HCT 53.7*  PLT 123*  CREATININE 1.54*    Estimated Creatinine Clearance: 59.1 mL/min (A) (by C-G formula based on SCr of 1.54 mg/dL (H)).   Medical History: Past Medical History:  Diagnosis Date  . Anxiety   . Diabetes mellitus without complication ([place])   . Gout   . High cholesterol   . Neuropathy   . Thyroid disease     Medications:  Scheduled:  . heparin  7,000 Units Intravenous Once   Infusions:  . heparin    . piperacillin-tazobactam 3.375 g (06/08/19 2148)  . vancomycin 1,000 mg (06/08/19 2149)  . [START ON 06/09/2019] vancomycin      Assessment: Patient is a 84 yom that presented to the ED for evaluation of a DVT in his LLE. There was also some concern for cellulitis of the same leg. Pharmacy has been asked to dose heparin and vancomycin in this patient.   Goal of Therapy:  Heparin level 0.3-0.7 units/ml Monitor platelets by anticoagulation protocol: Yes AUC 400-550  Plan:  Anticoagulation  - Heparin bolus of 7000 units IV x 1 dose  - Followed by Heparin drip @ 1800 units/hr  - Heparin level in ~ 6 hours  - Monitor patient for s/s of bleeding while on heparin  - Monitor platelets as on admission was 123, however Hgb was 18 (labs may need repeated)   Antibiotic - No loading dose indicated for cellulitis  - Vancomycin 1000mg  IV x 1 dose given in the ED  - Followed by Vancomycin 1250mg  IV q24h  - Est Calc AUC 491 - Patient has a hx of CKD monitor renal  function and urine output   Duanne Limerick PharmD. BCPS  06/08/2019,10:17 PM

## 2019-06-09 ENCOUNTER — Inpatient Hospital Stay (HOSPITAL_COMMUNITY): Payer: Medicare Other

## 2019-06-09 ENCOUNTER — Encounter (HOSPITAL_COMMUNITY): Payer: Self-pay | Admitting: Internal Medicine

## 2019-06-09 DIAGNOSIS — L03115 Cellulitis of right lower limb: Secondary | ICD-10-CM

## 2019-06-09 DIAGNOSIS — E039 Hypothyroidism, unspecified: Secondary | ICD-10-CM | POA: Diagnosis present

## 2019-06-09 DIAGNOSIS — D582 Other hemoglobinopathies: Secondary | ICD-10-CM | POA: Diagnosis present

## 2019-06-09 DIAGNOSIS — N183 Chronic kidney disease, stage 3 unspecified: Secondary | ICD-10-CM | POA: Diagnosis present

## 2019-06-09 DIAGNOSIS — I10 Essential (primary) hypertension: Secondary | ICD-10-CM | POA: Diagnosis present

## 2019-06-09 DIAGNOSIS — D696 Thrombocytopenia, unspecified: Secondary | ICD-10-CM | POA: Diagnosis present

## 2019-06-09 DIAGNOSIS — E1140 Type 2 diabetes mellitus with diabetic neuropathy, unspecified: Secondary | ICD-10-CM | POA: Diagnosis present

## 2019-06-09 DIAGNOSIS — E1129 Type 2 diabetes mellitus with other diabetic kidney complication: Secondary | ICD-10-CM | POA: Diagnosis present

## 2019-06-09 DIAGNOSIS — L0390 Cellulitis, unspecified: Secondary | ICD-10-CM | POA: Diagnosis present

## 2019-06-09 DIAGNOSIS — M109 Gout, unspecified: Secondary | ICD-10-CM | POA: Diagnosis present

## 2019-06-09 DIAGNOSIS — F411 Generalized anxiety disorder: Secondary | ICD-10-CM | POA: Diagnosis present

## 2019-06-09 DIAGNOSIS — E1122 Type 2 diabetes mellitus with diabetic chronic kidney disease: Secondary | ICD-10-CM | POA: Diagnosis present

## 2019-06-09 DIAGNOSIS — E785 Hyperlipidemia, unspecified: Secondary | ICD-10-CM | POA: Diagnosis present

## 2019-06-09 DIAGNOSIS — I824Z1 Acute embolism and thrombosis of unspecified deep veins of right distal lower extremity: Secondary | ICD-10-CM | POA: Diagnosis present

## 2019-06-09 LAB — CBC WITH DIFFERENTIAL/PLATELET
Abs Immature Granulocytes: 0.02 10*3/uL (ref 0.00–0.07)
Basophils Absolute: 0 10*3/uL (ref 0.0–0.1)
Basophils Relative: 0 %
Eosinophils Absolute: 0.1 10*3/uL (ref 0.0–0.5)
Eosinophils Relative: 2 %
HCT: 52 % (ref 39.0–52.0)
Hemoglobin: 18.4 g/dL — ABNORMAL HIGH (ref 13.0–17.0)
Immature Granulocytes: 0 %
Lymphocytes Relative: 17 %
Lymphs Abs: 1 10*3/uL (ref 0.7–4.0)
MCH: 33.5 pg (ref 26.0–34.0)
MCHC: 35.4 g/dL (ref 30.0–36.0)
MCV: 94.7 fL (ref 80.0–100.0)
Monocytes Absolute: 0.5 10*3/uL (ref 0.1–1.0)
Monocytes Relative: 9 %
Neutro Abs: 4.2 10*3/uL (ref 1.7–7.7)
Neutrophils Relative %: 72 %
Platelets: 113 10*3/uL — ABNORMAL LOW (ref 150–400)
RBC: 5.49 MIL/uL (ref 4.22–5.81)
RDW: 13 % (ref 11.5–15.5)
WBC: 5.8 10*3/uL (ref 4.0–10.5)
nRBC: 0 % (ref 0.0–0.2)

## 2019-06-09 LAB — GLUCOSE, CAPILLARY
Glucose-Capillary: 118 mg/dL — ABNORMAL HIGH (ref 70–99)
Glucose-Capillary: 126 mg/dL — ABNORMAL HIGH (ref 70–99)
Glucose-Capillary: 113 mg/dL — ABNORMAL HIGH (ref 70–99)
Glucose-Capillary: 107 mg/dL — ABNORMAL HIGH (ref 70–99)

## 2019-06-09 LAB — COMPREHENSIVE METABOLIC PANEL
ALT: 19 U/L (ref 0–44)
AST: 19 U/L (ref 15–41)
Albumin: 3.3 g/dL — ABNORMAL LOW (ref 3.5–5.0)
Alkaline Phosphatase: 42 U/L (ref 38–126)
Anion gap: 16 — ABNORMAL HIGH (ref 5–15)
BUN: 17 mg/dL (ref 8–23)
CO2: 24 mmol/L (ref 22–32)
Calcium: 9.1 mg/dL (ref 8.9–10.3)
Chloride: 100 mmol/L (ref 98–111)
Creatinine, Ser: 1.45 mg/dL — ABNORMAL HIGH (ref 0.61–1.24)
GFR calc Af Amer: 57 mL/min — ABNORMAL LOW (ref >60)
GFR calc non Af Amer: 49 mL/min — ABNORMAL LOW (ref >60)
Glucose, Bld: 132 mg/dL — ABNORMAL HIGH (ref 70–99)
Potassium: 3 mmol/L — ABNORMAL LOW (ref 3.5–5.1)
Sodium: 140 mmol/L (ref 135–145)
Total Bilirubin: 1.7 mg/dL — ABNORMAL HIGH (ref 0.3–1.2)
Total Protein: 6.6 g/dL (ref 6.5–8.1)

## 2019-06-09 LAB — HIV ANTIBODY (ROUTINE TESTING W REFLEX): HIV Screen 4th Generation wRfx: NONREACTIVE

## 2019-06-09 LAB — HEPARIN LEVEL (UNFRACTIONATED): Heparin Unfractionated: 0.63 IU/mL (ref 0.30–0.70)

## 2019-06-09 MED ORDER — POTASSIUM CHLORIDE CRYS ER 20 MEQ PO TBCR
30.0000 meq | EXTENDED_RELEASE_TABLET | ORAL | Status: AC
  Administered 2019-06-09 (×2): 30 meq via ORAL
  Filled 2019-06-09 (×2): qty 1

## 2019-06-09 MED ORDER — TEMAZEPAM 7.5 MG PO CAPS
15.0000 mg | ORAL_CAPSULE | Freq: Every evening | ORAL | Status: DC | PRN
  Administered 2019-06-09: 15 mg via ORAL
  Filled 2019-06-09: qty 2

## 2019-06-09 MED ORDER — AMLODIPINE BESYLATE 5 MG PO TABS
5.0000 mg | ORAL_TABLET | Freq: Every day | ORAL | Status: DC
  Administered 2019-06-10: 5 mg via ORAL
  Filled 2019-06-09: qty 1

## 2019-06-09 MED ORDER — RIVAROXABAN 15 MG PO TABS
15.0000 mg | ORAL_TABLET | Freq: Two times a day (BID) | ORAL | Status: DC
  Administered 2019-06-09 – 2019-06-10 (×3): 15 mg via ORAL
  Filled 2019-06-09 (×4): qty 1

## 2019-06-09 MED ORDER — POTASSIUM CHLORIDE CRYS ER 20 MEQ PO TBCR
20.0000 meq | EXTENDED_RELEASE_TABLET | Freq: Three times a day (TID) | ORAL | Status: DC
  Administered 2019-06-10: 20 meq via ORAL
  Filled 2019-06-09 (×2): qty 1

## 2019-06-09 MED ORDER — ALPRAZOLAM 0.25 MG PO TABS: 0.2500 mg | ORAL_TABLET | Freq: Three times a day (TID) | ORAL | Status: DC | PRN

## 2019-06-09 MED ORDER — AMLODIPINE BESY-BENAZEPRIL HCL 5-10 MG PO CAPS: 1.0000 | ORAL_CAPSULE | Freq: Every day | ORAL | Status: DC

## 2019-06-09 MED ORDER — GABAPENTIN 300 MG PO CAPS: 300.0000 mg | ORAL_CAPSULE | Freq: Three times a day (TID) | ORAL | Status: DC

## 2019-06-09 MED ORDER — THIAMINE HCL 100 MG/ML IJ SOLN: 100.0000 mg | Freq: Every day | INTRAMUSCULAR | Status: DC

## 2019-06-09 MED ORDER — FOLIC ACID 1 MG PO TABS
1.0000 mg | ORAL_TABLET | Freq: Every day | ORAL | Status: DC
  Administered 2019-06-09 – 2019-06-10 (×2): 1 mg via ORAL
  Filled 2019-06-09 (×2): qty 1

## 2019-06-09 MED ORDER — RIVAROXABAN 20 MG PO TABS: 20.0000 mg | ORAL_TABLET | Freq: Every day | ORAL | Status: DC

## 2019-06-09 MED ORDER — SODIUM CHLORIDE 0.9 % IV SOLN
INTRAVENOUS | Status: DC | PRN
  Administered 2019-06-09: 250 mL via INTRAVENOUS

## 2019-06-09 MED ORDER — DOXAZOSIN MESYLATE 4 MG PO TABS
4.0000 mg | ORAL_TABLET | Freq: Every day | ORAL | Status: DC
  Administered 2019-06-09 – 2019-06-10 (×2): 4 mg via ORAL
  Filled 2019-06-09 (×2): qty 1

## 2019-06-09 MED ORDER — PANTOPRAZOLE SODIUM 40 MG PO TBEC
40.0000 mg | DELAYED_RELEASE_TABLET | Freq: Two times a day (BID) | ORAL | Status: DC
  Administered 2019-06-09 – 2019-06-10 (×3): 40 mg via ORAL
  Filled 2019-06-09 (×3): qty 1

## 2019-06-09 MED ORDER — POTASSIUM CHLORIDE CRYS ER 20 MEQ PO TBCR
30.0000 meq | EXTENDED_RELEASE_TABLET | Freq: Three times a day (TID) | ORAL | Status: DC
  Administered 2019-06-09: 30 meq via ORAL
  Filled 2019-06-09: qty 1

## 2019-06-09 MED ORDER — HYDRALAZINE HCL 20 MG/ML IJ SOLN
10.0000 mg | INTRAMUSCULAR | Status: DC | PRN
  Administered 2019-06-09: 10 mg via INTRAVENOUS
  Filled 2019-06-09: qty 1

## 2019-06-09 MED ORDER — ONDANSETRON HCL 4 MG/2ML IJ SOLN: 4.0000 mg | Freq: Four times a day (QID) | INTRAMUSCULAR | Status: DC | PRN

## 2019-06-09 MED ORDER — ACETAMINOPHEN 650 MG RE SUPP: 650.0000 mg | Freq: Four times a day (QID) | RECTAL | Status: DC | PRN

## 2019-06-09 MED ORDER — BENAZEPRIL HCL 20 MG PO TABS
10.0000 mg | ORAL_TABLET | Freq: Every day | ORAL | Status: DC
  Administered 2019-06-10: 10 mg via ORAL
  Filled 2019-06-09: qty 1

## 2019-06-09 MED ORDER — ACETAMINOPHEN 325 MG PO TABS: 650.0000 mg | ORAL_TABLET | Freq: Four times a day (QID) | ORAL | Status: DC | PRN

## 2019-06-09 MED ORDER — METOPROLOL TARTRATE 50 MG PO TABS
50.0000 mg | ORAL_TABLET | Freq: Two times a day (BID) | ORAL | Status: DC
  Administered 2019-06-09 – 2019-06-10 (×2): 50 mg via ORAL
  Filled 2019-06-09 (×2): qty 1

## 2019-06-09 MED ORDER — LEVOTHYROXINE SODIUM 112 MCG PO TABS
112.0000 ug | ORAL_TABLET | Freq: Every day | ORAL | Status: DC
  Administered 2019-06-10: 112 ug via ORAL
  Filled 2019-06-09: qty 1

## 2019-06-09 MED ORDER — ADULT MULTIVITAMIN W/MINERALS CH
1.0000 | ORAL_TABLET | Freq: Every day | ORAL | Status: DC
  Administered 2019-06-09 – 2019-06-10 (×2): 1 via ORAL
  Filled 2019-06-09 (×2): qty 1

## 2019-06-09 MED ORDER — PIPERACILLIN-TAZOBACTAM 3.375 G IVPB
3.3750 g | Freq: Three times a day (TID) | INTRAVENOUS | Status: DC
  Administered 2019-06-09 – 2019-06-10 (×4): 3.375 g via INTRAVENOUS
  Filled 2019-06-09 (×3): qty 50

## 2019-06-09 MED ORDER — COLCHICINE 0.6 MG PO TABS
0.6000 mg | ORAL_TABLET | Freq: Every day | ORAL | Status: DC
  Administered 2019-06-10: 0.6 mg via ORAL
  Filled 2019-06-09: qty 1

## 2019-06-09 MED ORDER — PREGABALIN 75 MG PO CAPS
75.0000 mg | ORAL_CAPSULE | Freq: Two times a day (BID) | ORAL | Status: DC
  Administered 2019-06-09 – 2019-06-10 (×3): 75 mg via ORAL
  Filled 2019-06-09 (×3): qty 1

## 2019-06-09 MED ORDER — LORAZEPAM 2 MG/ML IJ SOLN: 1.0000 mg | INTRAMUSCULAR | Status: DC | PRN

## 2019-06-09 MED ORDER — LORAZEPAM 1 MG PO TABS: 1.0000 mg | ORAL_TABLET | ORAL | Status: DC | PRN

## 2019-06-09 MED ORDER — INSULIN ASPART 100 UNIT/ML ~~LOC~~ SOLN
0.0000 [IU] | Freq: Three times a day (TID) | SUBCUTANEOUS | Status: DC
  Administered 2019-06-09: 1 [IU] via SUBCUTANEOUS

## 2019-06-09 MED ORDER — ATORVASTATIN CALCIUM 40 MG PO TABS
40.0000 mg | ORAL_TABLET | Freq: Every day | ORAL | Status: DC
  Administered 2019-06-09: 40 mg via ORAL
  Filled 2019-06-09: qty 1

## 2019-06-09 MED ORDER — GEMFIBROZIL 600 MG PO TABS
600.0000 mg | ORAL_TABLET | Freq: Two times a day (BID) | ORAL | Status: DC
  Administered 2019-06-09 – 2019-06-10 (×2): 600 mg via ORAL
  Filled 2019-06-09 (×3): qty 1

## 2019-06-09 MED ORDER — THIAMINE HCL 100 MG PO TABS
100.0000 mg | ORAL_TABLET | Freq: Every day | ORAL | Status: DC
  Administered 2019-06-09 – 2019-06-10 (×2): 100 mg via ORAL
  Filled 2019-06-09 (×2): qty 1

## 2019-06-09 MED ORDER — ONDANSETRON HCL 4 MG PO TABS: 4.0000 mg | ORAL_TABLET | Freq: Four times a day (QID) | ORAL | Status: DC | PRN

## 2019-06-09 MED ORDER — HYDROCHLOROTHIAZIDE 25 MG PO TABS: 25.0000 mg | ORAL_TABLET | Freq: Every day | ORAL | Status: DC

## 2019-06-09 MED ORDER — PAROXETINE HCL 20 MG PO TABS
40.0000 mg | ORAL_TABLET | Freq: Every day | ORAL | Status: DC
  Administered 2019-06-09 – 2019-06-10 (×2): 40 mg via ORAL
  Filled 2019-06-09 (×2): qty 2

## 2019-06-09 NOTE — Progress Notes (Signed)
PROGRESS NOTE    Jonathon [name]  KWI:[id number] DOB: 27-Mar-1951 DOA: 06/08/2019 PCP: Willy Eddy, MD     Brief Narrative:  Patient is a 69 year old male with a past medical history of type 2 diabetes, CKD 3, hypertension, and hypothyroidism who presented to the ED for discharge/ulceration between his toes on the right the day prior to admission.  He has neuropathy so he lacks sensation in his feet.  No recent trauma or change in footwear.  With a complaint of pain in the right lower extremity, Doppler showed a femoral vein DVT.  He was started on a heparin drip.  For possible cellulitis, he was started on vancomycin and Zosyn.   New events last 24 hours / Subjective: Patient reports no pain.  He appears to be tolerating the medicines well. MRI neg for osteo.   Assessment & Plan:   Principal Problem:   Cellulitis of right foot  Continue vancomycin and Zosyn  MRI shows no osteomyelitis  If he does well overnight, will DC home tomorrow  Active Problems:   DVT, lower extremity, distal, acute, right (HCC)  Change HPN to Xarelto, appreciate pharm  Defer workup to hypercoag state to outpatient    Essential hypertension  Metoprolol 50 mg twice daily  Cardura 4 mg daily  Substitute home combo pill to amlodipine-benazepril 5-10 mg daily    CKD (chronic kidney disease) stage 3, GFR 30-59 ml/min  Monitor BMP    DM (diabetes mellitus), type 2 with renal complications (HCC)  Sliding scale insulin  Carb modified diet  Pregabalin 75 mg twice daily  Gabapentin 3 mg 3 times daily    Hyperlipidemia  Lipitor 40 mg daily  Lopid 600 mg twice daily    Thrombocytopenia (HCC)  Monitor CBC    Elevated hemoglobin (HCC)  Monitor CBC    Hypothyroidism  Levothyroxine 112 mcg daily    GAD  Paxil 40 mg daily  Xanax 0.5 mg 3 times daily as needed for anxiety  Restoril 50 mg nightly as needed for sleep    Hypokalemia  Hold home hydrochlorothiazide  Replaced today    Daily  alcohol use  CIWA  Thiamine/MV/folate   DVT prophylaxis: Changed from heparin to Xarelto Code Status: Full Family Communication: Spoke with both patient and his wife Coming From: Home Disposition Plan: Home Barriers to Discharge: Clinical improvement; anticipate discharge tomorrow  Antimicrobials:  Anti-infectives (From admission, onward)   Start     Dose/Rate Route Frequency Ordered Stop   06/09/19 2200  vancomycin (VANCOREADY) IVPB 1250 mg/250 mL     1,250 mg 166.7 mL/hr over 90 Minutes Intravenous Every 24 hours 06/08/19 2217     06/09/19 0600  piperacillin-tazobactam (ZOSYN) IVPB 3.375 g     3.375 g 12.5 mL/hr over 240 Minutes Intravenous Every 8 hours 06/09/19 0549     06/08/19 2130  vancomycin (VANCOCIN) IVPB 1000 mg/200 mL premix     1,000 mg 200 mL/hr over 60 Minutes Intravenous  Once 06/08/19 2126 06/08/19 2335   06/08/19 2115  piperacillin-tazobactam (ZOSYN) IVPB 3.375 g     3.375 g 100 mL/hr over 30 Minutes Intravenous  Once 06/08/19 2114 06/08/19 2224       Objective: Vitals:   06/09/19 0256 06/09/19 0412 06/09/19 0652 06/09/19 0806  BP: (!) 123/97 (!) 153/116 (!) 142/103 (!) 154/100  Pulse: 70 90 87 (!) 101  Resp: 18 14  18   Temp:  98.2 F (36.8 C)  97.8 F (36.6 C)  TempSrc:  Oral  Oral  SpO2: 97% 96%  98%  Weight:      Height:        Intake/Output Summary (Last 24 hours) at 06/09/2019 1040 Last data filed at 06/09/2019 0700 Gross per 24 hour  Intake 370 ml  Output 300 ml  Net 70 ml   Filed Weights   06/08/19 2020  Weight: 114.3 kg    Examination:  General exam: Appears calm and comfortable  Respiratory system: Clear to auscultation. Respiratory effort normal. No respiratory distress. No conversational dyspnea.  Cardiovascular system: S1 & S2 heard, RRR. No pedal edema. Gastrointestinal system: Abdomen is nondistended, soft and nontender. Normal bowel sounds heard. Central nervous system: Alert and oriented. No focal neurological deficits.  Speech clear.  Extremities: Symmetric in appearance  Skin: R foot is bandaged. No foul odor.   Psychiatry: Judgement and insight appear normal. Mood & affect appropriate.   Data Reviewed: I have personally reviewed following labs and imaging studies  CBC: Recent Labs  Lab 06/08/19 2128 06/09/19 0559  WBC 7.1 5.8  NEUTROABS 5.2 4.2  HGB 18.2* 18.4*  HCT 53.7* 52.0  MCV 96.6 94.7  PLT 123* 676*   Basic Metabolic Panel: Recent Labs  Lab 06/08/19 2128 06/09/19 0559  NA 137 140  K 3.8 3.0*  CL 100 100  CO2 27 24  GLUCOSE 125* 132*  BUN 20 17  CREATININE 1.54* 1.45*  CALCIUM 9.0 9.1   GFR: Estimated Creatinine Clearance: 62.8 mL/min (A) (by C-G formula based on SCr of 1.45 mg/dL (H)). Liver Function Tests: Recent Labs  Lab 06/08/19 2128 06/09/19 0559  AST 22 19  ALT 19 19  ALKPHOS 46 42  BILITOT 1.7* 1.7*  PROT 7.2 6.6  ALBUMIN 3.7 3.3*   CBG: Recent Labs  Lab 06/09/19 0620  GLUCAP 118*   Sepsis Labs: Recent Labs  Lab 06/08/19 2128  LATICACIDVEN 1.7    Recent Results (from the past 240 hour(s))  Respiratory Panel by RT PCR (Flu A&B, Covid) - Nasopharyngeal Swab     Status: None   Collection Time: 06/08/19  9:28 PM   Specimen: Nasopharyngeal Swab  Result Value Ref Range Status   SARS Coronavirus 2 by RT PCR NEGATIVE NEGATIVE Final    Comment: (NOTE) SARS-CoV-2 target nucleic acids are NOT DETECTED. The SARS-CoV-2 RNA is generally detectable in upper respiratoy specimens during the acute phase of infection. The lowest concentration of SARS-CoV-2 viral copies this assay can detect is 131 copies/mL. A negative result does not preclude SARS-Cov-2 infection and should not be used as the sole basis for treatment or other patient management decisions. A negative result may occur with  improper specimen collection/handling, submission of specimen other than nasopharyngeal swab, presence of viral mutation(s) within the areas targeted by this assay, and  inadequate number of viral copies (<131 copies/mL). A negative result must be combined with clinical observations, patient history, and epidemiological information. The expected result is Negative. Fact Sheet for Patients:  PinkCheek.be Fact Sheet for Healthcare Providers:  GravelBags.it This test is not yet ap proved or cleared by the [place] FDA and  has been authorized for detection and/or diagnosis of SARS-CoV-2 by FDA under an Emergency Use Authorization (EUA). This EUA will remain  in effect (meaning this test can be used) for the duration of the COVID-19 declaration under Section 564(b)(1) of the Act, 21 U.S.C. section 360bbb-3(b)(1), unless the authorization is terminated or revoked sooner.    Influenza A by PCR NEGATIVE NEGATIVE Final   Influenza B by  PCR NEGATIVE NEGATIVE Final    Comment: (NOTE) The Xpert Xpress SARS-CoV-2/FLU/RSV assay is intended as an aid in  the diagnosis of influenza from Nasopharyngeal swab specimens and  should not be used as a sole basis for treatment. Nasal washings and  aspirates are unacceptable for Xpert Xpress SARS-CoV-2/FLU/RSV  testing. Fact Sheet for Patients: PinkCheek.be Fact Sheet for Healthcare Providers: GravelBags.it This test is not yet approved or cleared by the [place] FDA and  has been authorized for detection and/or diagnosis of SARS-CoV-2 by  FDA under an Emergency Use Authorization (EUA). This EUA will remain  in effect (meaning this test can be used) for the duration of the  Covid-19 declaration under Section 564(b)(1) of the Act, 21  U.S.C. section 360bbb-3(b)(1), unless the authorization is  terminated or revoked. Performed at [facility], [place] [address] [place], [place] [address]       Radiology Studies: MR FOOT RIGHT WO CONTRAST  Result Date: 06/09/2019 CLINICAL DATA:  Pain and  swelling. Diabetic foot ulcers. EXAM: MRI OF THE RIGHT FOREFOOT WITHOUT CONTRAST TECHNIQUE: Multiplanar, multisequence MR imaging of the right foot was performed. No intravenous contrast was administered. COMPARISON:  Radiographs 06/08/2019 FINDINGS: Moderate dorsal subcutaneous soft tissue swelling/edema/fluid suggesting cellulitis. No discrete fluid collection to suggest a soft tissue abscess. Mild to moderate myositis involving the plantar foot musculature without definite findings for pyomyositis. No MR findings suspicious for septic arthritis or osteomyelitis. The major tendons and ligaments appear intact. No findings for septic tenosynovitis. IMPRESSION: 1. Cellulitis and myositis but no discrete soft tissue abscess or pyomyositis. 2. No MR findings for septic arthritis or osteomyelitis. Electronically Signed   By: Marijo Sanes M.D.   On: 06/09/2019 09:40   US Venous Img Lower Unilateral Right (DVT)  Addendum Date: 06/08/2019   ADDENDUM REPORT: 06/08/2019 20:06 ADDENDUM: Multiple attempts were made to contact the referring provider office without success. The patient was instructed to go to the emergency room for further evaluation. Electronically Signed   By: Audie Pinto M.D.   On: 06/08/2019 20:06   Result Date: 06/08/2019 CLINICAL DATA:  Right lower extremity pain for 1 week. EXAM: RIGHT LOWER EXTREMITY VENOUS DOPPLER ULTRASOUND TECHNIQUE: Gray-scale sonography with compression, as well as color and duplex ultrasound, were performed to evaluate the deep venous system(s) from the level of the common femoral vein through the popliteal and proximal calf veins. COMPARISON:  None. FINDINGS: VENOUS There is incomplete compression of the distal femoral vein. There is evidence of nonocclusive thrombus on grayscale images in the distal femoral vein. There is normal compressibility of the common femoral, proximal to mid femoral vein, superficial femoral, and popliteal veins, as well as the visualized  calf veins. Visualized portions of profunda femoral vein and great saphenous vein unremarkable. Doppler waveforms show normal direction of venous flow, normal respiratory phasicity and response to augmentation. Limited views of the contralateral common femoral vein are unremarkable. OTHER None. Limitations: none IMPRESSION: Nonocclusive thrombus in the distal right femoral vein. The remainder of the deep venous system appears is patent without thrombus. Electronically Signed: By: Audie Pinto M.D. On: 06/08/2019 19:52   DG Foot Complete Right  Result Date: 06/08/2019 CLINICAL DATA:  Diabetes, right foot infection EXAM: RIGHT FOOT COMPLETE - 3+ VIEW COMPARISON:  None. FINDINGS: Frontal, oblique, lateral views of the right foot are obtained. No acute displaced fractures. Mild osteoarthritis of the first metatarsophalangeal joint. Diffuse vascular calcifications are identified. The soft tissues are otherwise unremarkable. No radiographic evidence  of osteomyelitis. IMPRESSION: 1. No acute or destructive bony lesions. Electronically Signed   By: Randa Ngo M.D.   On: 06/08/2019 21:53     Scheduled Meds: . folic acid  1 mg Oral Daily  . insulin aspart  0-9 Units Subcutaneous TID WC  . multivitamin with minerals  1 tablet Oral Daily  . potassium chloride  30 mEq Oral TID  . thiamine  100 mg Oral Daily   Or  . thiamine  100 mg Intravenous Daily   Continuous Infusions: . heparin 1,800 Units/hr (06/09/19 0802)  . piperacillin-tazobactam (ZOSYN)  IV 3.375 g (06/09/19 0624)  . vancomycin       LOS: 1 day    Time spent: 35 minutes   Shelda Pal, DO [facility] 06/09/2019, 10:40 AM   Available via Epic secure chat 7am-7pm After these hours, please refer to coverage provider listed on [email or web address]

## 2019-06-09 NOTE — Progress Notes (Signed)
PRN hydralazine given as ordered for htn, to follow.

## 2019-06-09 NOTE — Progress Notes (Signed)
[place] for Heparin  Indication: DVT  Allergies  Allergen Reactions  . Morphine And Related Itching    Patient Measurements: Height: 6' (182.9 cm) Weight: 252 lb (114.3 kg) IBW/kg (Calculated) : 77.6 Heparin Dosing Weight: 102.2kg  Vital Signs: Temp: 98.2 F (36.8 C) (02/11 0412) Temp Source: Oral (02/11 0412) BP: 153/116 (02/11 0412) Pulse Rate: 70 (02/11 0256)  Labs: Recent Labs    06/08/19 2128 06/09/19 0559  HGB 18.2*  --   HCT 53.7*  --   PLT 123*  --   HEPARINUNFRC  --  0.63  CREATININE 1.54*  --     Estimated Creatinine Clearance: 59.1 mL/min (A) (by C-G formula based on SCr of 1.54 mg/dL (H)).   Medical History: Past Medical History:  Diagnosis Date  . Anxiety   . Diabetes mellitus without complication ([place])   . Gout   . High cholesterol   . Neuropathy   . Thyroid disease     Medications:  Scheduled:  . folic acid  1 mg Oral Daily  . insulin aspart  0-9 Units Subcutaneous TID WC  . multivitamin with minerals  1 tablet Oral Daily  . thiamine  100 mg Oral Daily   Or  . thiamine  100 mg Intravenous Daily   Infusions:  . heparin 1,800 Units/hr (06/08/19 2241)  . piperacillin-tazobactam (ZOSYN)  IV 3.375 g (06/09/19 0624)  . vancomycin      Assessment: Patient is a 28 yom that presented to the ED for evaluation of a DVT in his LLE. There was also some concern for cellulitis of the same leg. Pharmacy has been asked to dose heparin and vancomycin in this patient.   Initial heparin level therapeutic   Goal of Therapy:  Heparin level 0.3-0.7 units/ml Monitor platelets by anticoagulation protocol: Yes  Plan:  Cont heparin at 1800 units/hr Confirmatory heparin level at [place], PharmD, [place] Pharmacist Phone: [phone]

## 2019-06-09 NOTE — Progress Notes (Signed)
Pharmacy called for med rec tech

## 2019-06-09 NOTE — H&P (Signed)
History and Physical    Jonathon [name] FHL:[id number] DOB: 1950-09-21 DOA: 06/08/2019  PCP: Willy Eddy, MD  Patient coming from: Home.  Chief Complaint: Right foot erythema and ulcerations.  HPI: Jonathon [name] is a 69 y.o. male with history of diabetes mellitus type 2, chronic kidney disease stage III, hypertension, hypothyroidism noticed that his legs were becoming red and had some discharge with ulceration on in between the toes yesterday morning.  Patient states he noticed these changes only yesterday.  Denies having any recent trauma or any new footwear.  Also had some pain around his lateral aspect of the lower extremities from the thigh to the leg over the last 1 week.  His primary care physician advised him to come to the ER concerning for possible DVT.  ED Course: In the ER on exam patient has multiple ulceration around the space in between all the toes with some discharge.  Foot is erythematous mid area in the distal aspect of the right foot.  Dopplers show femoral vein DVT.  X-rays do not show any bony involvement of the right foot.  Patient was started on vancomycin and heparin for cellulitis and DVT.  Admitted for further management.  On exam patient has good pulses on the right foot.  Labs show creatinine 1.4 hemoglobin 18.2 platelets 123 Covid test was negative.  Review of Systems: As per HPI, rest all negative.   Past Medical History:  Diagnosis Date  . Anxiety   . Diabetes mellitus without complication ([place])   . Gout   . High cholesterol   . Neuropathy   . Thyroid disease     Past Surgical History:  Procedure Laterality Date  . ADRENALECTOMY       reports that he has quit smoking. He has never used smokeless tobacco. He reports current alcohol use. He reports that he does not use drugs.  Allergies  Allergen Reactions  . Morphine And Related Itching    Family History  Problem Relation Age of Onset  . Diabetes Mellitus II Maternal Aunt      Prior to Admission medications   Medication Sig Start Date End Date Taking? Authorizing Provider  aspirin 81 MG EC tablet 81 mg per 1 tablet, ORAL, Daily, 30 tablet, 0 Refill(s), Activate Med (Rx) 03/31/12  Yes [provider]  hydrochlorothiazide (HYDRODIURIL) 25 MG tablet TAKE 1 TABLET BY MOUTH EVERY DAY 08/21/16  Yes [provider]  pantoprazole (PROTONIX) 40 MG tablet Take by mouth. 09/17/12  Yes [provider]  potassium chloride SA (KLOR-CON) 20 MEQ tablet Take by mouth. 09/17/12  Yes [provider]  pregabalin (LYRICA) 75 MG capsule TAKE ONE CAPSULE BY MOUTH TWICE A DAY 09/10/16  Yes [provider]  ALPRAZolam (XANAX) 0.25 MG tablet Take 0.25 mg by mouth 3 (three) times daily as needed. 04/14/19   [provider]  amLODipine-benazepril (LOTREL) 5-10 MG capsule Take by mouth.    [provider]  colchicine 0.6 MG tablet Take by mouth.    [provider]  doxazosin (CARDURA) 4 MG tablet Take by mouth.    [provider]  febuxostat (ULORIC) 40 MG tablet Take 40 mg by mouth daily. 04/15/19   [provider]  gabapentin (NEURONTIN) 300 MG capsule Take by mouth.    [provider]  gemfibrozil (LOPID) 600 MG tablet Take by mouth.    [provider]  indomethacin (INDOCIN) 50 MG capsule Take 50 mg by mouth 2 (two) times daily. 06/06/19  [provider]  JARDIANCE 10 MG TABS tablet Take 10 mg by mouth daily. 05/19/19   [provider]  levothyroxine (SYNTHROID) 112 MCG tablet Take by mouth.    [provider]  magnesium oxide (MAG-OX) 400 MG tablet Take by mouth.    [provider]  metoprolol tartrate (LOPRESSOR) 50 MG tablet Take by mouth.    [provider]  oxybutynin (DITROPAN-XL) 5 MG 24 hr tablet Take by mouth.    [provider]  PARoxetine (PAXIL) 40 MG tablet Take by mouth.    [provider]  temazepam (RESTORIL)  15 MG capsule Take 15 mg by mouth at bedtime as needed. 05/18/19   [provider]  testosterone cypionate (DEPOTESTOTERONE CYPIONATE) 100 MG/ML injection 100 mg every 14 (fourteen) days. 02/22/19   [provider]  zolpidem (AMBIEN) 10 MG tablet Take by mouth.    [provider]    Physical Exam: Constitutional: Moderately built and nourished. Vitals:   06/08/19 2337 06/09/19 0136 06/09/19 0256 06/09/19 0412  BP: (!) 147/101 (!) 151/105 (!) 123/97 (!) 153/116  Pulse: 87 70 70   Resp: 18 18 18 14   Temp:    98.2 F (36.8 C)  TempSrc:    Oral  SpO2: 98% 98% 97% 96%  Weight:      Height:       Eyes: Anicteric no pallor. ENMT: No discharge from the ears eyes nose or mouth. Neck: No mass felt.  No neck rigidity.  No JVD appreciated. Respiratory: No rhonchi or crepitations. Cardiovascular: S1-S2 heard. Abdomen: Soft nontender bowel sounds present. Musculoskeletal: Mildly erythematous right foot in the distal aspect midfoot.  Pulses are full.  Multiple ulcerations in between the toes of the right foot. Skin: Erythema of the right foot distal aspect with multiple ulceration in between the toes of the right foot. Neurologic: Alert awake oriented to time place and person.  Moves all extremities. Psychiatric: Appears normal but normal affect.   Labs on Admission: I have personally reviewed following labs and imaging studies  CBC: Recent Labs  Lab 06/08/19 2128  WBC 7.1  NEUTROABS 5.2  HGB 18.2*  HCT 53.7*  MCV 96.6  PLT 676*   Basic Metabolic Panel: Recent Labs  Lab 06/08/19 2128  NA 137  K 3.8  CL 100  CO2 27  GLUCOSE 125*  BUN 20  CREATININE 1.54*  CALCIUM 9.0   GFR: Estimated Creatinine Clearance: 59.1 mL/min (A) (by C-G formula based on SCr of 1.54 mg/dL (H)). Liver Function Tests: Recent Labs  Lab 06/08/19 2128  AST 22  ALT 19  ALKPHOS 46  BILITOT 1.7*  PROT 7.2  ALBUMIN 3.7   No results for input(s): LIPASE, AMYLASE in the  last 168 hours. No results for input(s): AMMONIA in the last 168 hours. Coagulation Profile: No results for input(s): INR, PROTIME in the last 168 hours. Cardiac Enzymes: No results for input(s): CKTOTAL, CKMB, CKMBINDEX, TROPONINI in the last 168 hours. BNP (last 3 results) No results for input(s): PROBNP in the last 8760 hours. HbA1C: No results for input(s): HGBA1C in the last 72 hours. CBG: No results for input(s): GLUCAP in the last 168 hours. Lipid Profile: No results for input(s): CHOL, HDL, LDLCALC, TRIG, CHOLHDL, LDLDIRECT in the last 72 hours. Thyroid Function Tests: No results for input(s): TSH, T4TOTAL, FREET4, T3FREE, THYROIDAB in the last 72 hours. Anemia Panel: No results for input(s): VITAMINB12, FOLATE, FERRITIN, TIBC, IRON, RETICCTPCT in the last 72 hours. Urine analysis:  No results found for: COLORURINE, APPEARANCEUR, LABSPEC, PHURINE, GLUCOSEU, HGBUR, BILIRUBINUR, KETONESUR, PROTEINUR, UROBILINOGEN, NITRITE, LEUKOCYTESUR Sepsis Labs: @LABRCNTIP (procalcitonin:4,lacticidven:4) ) Recent Results (from the past 240 hour(s))  Respiratory Panel by RT PCR (Flu A&B, Covid) - Nasopharyngeal Swab     Status: None   Collection Time: 06/08/19  9:28 PM   Specimen: Nasopharyngeal Swab  Result Value Ref Range Status   SARS Coronavirus 2 by RT PCR NEGATIVE NEGATIVE Final    Comment: (NOTE) SARS-CoV-2 target nucleic acids are NOT DETECTED. The SARS-CoV-2 RNA is generally detectable in upper respiratoy specimens during the acute phase of infection. The lowest concentration of SARS-CoV-2 viral copies this assay can detect is 131 copies/mL. A negative result does not preclude SARS-Cov-2 infection and should not be used as the sole basis for treatment or other patient management decisions. A negative result may occur with  improper specimen collection/handling, submission of specimen other than nasopharyngeal swab, presence of viral mutation(s) within the areas targeted by this  assay, and inadequate number of viral copies (<131 copies/mL). A negative result must be combined with clinical observations, patient history, and epidemiological information. The expected result is Negative. Fact Sheet for Patients:  PinkCheek.be Fact Sheet for Healthcare Providers:  GravelBags.it This test is not yet ap proved or cleared by the [place] FDA and  has been authorized for detection and/or diagnosis of SARS-CoV-2 by FDA under an Emergency Use Authorization (EUA). This EUA will remain  in effect (meaning this test can be used) for the duration of the COVID-19 declaration under Section 564(b)(1) of the Act, 21 U.S.C. section 360bbb-3(b)(1), unless the authorization is terminated or revoked sooner.    Influenza A by PCR NEGATIVE NEGATIVE Final   Influenza B by PCR NEGATIVE NEGATIVE Final    Comment: (NOTE) The Xpert Xpress SARS-CoV-2/FLU/RSV assay is intended as an aid in  the diagnosis of influenza from Nasopharyngeal swab specimens and  should not be used as a sole basis for treatment. Nasal washings and  aspirates are unacceptable for Xpert Xpress SARS-CoV-2/FLU/RSV  testing. Fact Sheet for Patients: PinkCheek.be Fact Sheet for Healthcare Providers: GravelBags.it This test is not yet approved or cleared by the [place] FDA and  has been authorized for detection and/or diagnosis of SARS-CoV-2 by  FDA under an Emergency Use Authorization (EUA). This EUA will remain  in effect (meaning this test can be used) for the duration of the  Covid-19 declaration under Section 564(b)(1) of the Act, 21  U.S.C. section 360bbb-3(b)(1), unless the authorization is  terminated or revoked. Performed at [facility], [place] [address], [place], [place] [address]      Radiological Exams on Admission: US Venous Img Lower Unilateral Right (DVT)  Addendum  Date: 06/08/2019   ADDENDUM REPORT: 06/08/2019 20:06 ADDENDUM: Multiple attempts were made to contact the referring provider office without success. The patient was instructed to go to the emergency room for further evaluation. Electronically Signed   By: Audie Pinto M.D.   On: 06/08/2019 20:06   Result Date: 06/08/2019 CLINICAL DATA:  Right lower extremity pain for 1 week. EXAM: RIGHT LOWER EXTREMITY VENOUS DOPPLER ULTRASOUND TECHNIQUE: Gray-scale sonography with compression, as well as color and duplex ultrasound, were performed to evaluate the deep venous system(s) from the level of the common femoral vein through the popliteal and proximal calf veins. COMPARISON:  None. FINDINGS: VENOUS There is incomplete compression of the distal femoral vein. There is evidence of nonocclusive thrombus on grayscale images in the distal femoral vein. There  is normal compressibility of the common femoral, proximal to mid femoral vein, superficial femoral, and popliteal veins, as well as the visualized calf veins. Visualized portions of profunda femoral vein and great saphenous vein unremarkable. Doppler waveforms show normal direction of venous flow, normal respiratory phasicity and response to augmentation. Limited views of the contralateral common femoral vein are unremarkable. OTHER None. Limitations: none IMPRESSION: Nonocclusive thrombus in the distal right femoral vein. The remainder of the deep venous system appears is patent without thrombus. Electronically Signed: By: Audie Pinto M.D. On: 06/08/2019 19:52   DG Foot Complete Right  Result Date: 06/08/2019 CLINICAL DATA:  Diabetes, right foot infection EXAM: RIGHT FOOT COMPLETE - 3+ VIEW COMPARISON:  None. FINDINGS: Frontal, oblique, lateral views of the right foot are obtained. No acute displaced fractures. Mild osteoarthritis of the first metatarsophalangeal joint. Diffuse vascular calcifications are identified. The soft tissues are otherwise  unremarkable. No radiographic evidence of osteomyelitis. IMPRESSION: 1. No acute or destructive bony lesions. Electronically Signed   By: Randa Ngo M.D.   On: 06/08/2019 21:53     Assessment/Plan Principal Problem:   Cellulitis of right foot Active Problems:   DVT, lower extremity, distal, acute, right (HCC)   Essential hypertension   CKD (chronic kidney disease) stage 3, GFR 30-59 ml/min   Cellulitis   DM (diabetes mellitus), type 2 with renal complications ([place])    1. Cellulitis of the right foot for which I have placed patient on vancomycin and Zosyn.  We will get MRI of the right foot to make sure there is no deep abscess or osteomyelitis. 2. DVT of the right lower extremity of the femoral vein for which patient is on heparin.  If no surgical plans after MRI results then may change to oral anticoagulants. 3. Diabetes mellitus type 2 we will keep patient on sliding scale coverage since patient's home medications not known at this time. 4. Hypertension we will keep patient on IV antihypertensives as needed for now until he get home medication doses. 5. History of hypothyroidism takes Synthroid dose not known we will need to verify and continue. 6. Chronic kidney disease stage III creatinine appears to be at baseline when compared to labs in [place].  Follow metabolic panel. 7. Thrombocytopenia appears to be chronic.  Follow CBC. 8. Patient states he drinks alcohol every day we will keep patient on CIWA.  Given the acute cellulitis with diabetic foot ulcerations and multiple comorbidities including patient having acute DVT will need close monitoring for any worsening.  Will need inpatient status.   DVT prophylaxis: Heparin. Code Status: Full code. Family Communication: We will need to discuss with patient's wife. Disposition Plan: To be determined. Consults called: None. Admission status: Inpatient.   Rise Patience MD [facility] Pager [phone].  If 7PM-7AM, please contact night-coverage [email or web address] Password [facility]  06/09/2019, 5:58 AM

## 2019-06-09 NOTE — Plan of Care (Signed)

## 2019-06-09 NOTE — Progress Notes (Signed)
BP decrease to 142/103, HR 87 after hydralazine, continue to monitor.

## 2019-06-09 NOTE — Progress Notes (Signed)
Patient arrived via [facility] from [facility].  Right foot wrapped with kerlix.  BP 156/116, HR 100.  Patient denies immediate needs at this time.  Heparin drip at 69ml/HR.  Informed patient to be on bedrest for now.  Awaiting admission orders from [facility].

## 2019-06-09 NOTE — Discharge Instructions (Addendum)
Information on my medicine - XARELTO (rivaroxaban)  WHY WAS XARELTO PRESCRIBED FOR YOU? Xarelto was prescribed to treat blood clots that may have been found in the veins of your legs (deep vein thrombosis) or in your lungs (pulmonary embolism) and to reduce the risk of them occurring again.  What do you need to know about Xarelto? The starting dose is one 15 mg tablet taken TWICE daily with food for the FIRST 21 DAYS then on (enter date)  06/30/2019  the dose is changed to one 20 mg tablet taken ONCE A DAY with your evening meal.  DO NOT stop taking Xarelto without talking to the health care provider who prescribed the medication.  Refill your prescription for 20 mg tablets before you run out.  After discharge, you should have regular check-up appointments with your healthcare provider that is prescribing your Xarelto.  In the future your dose may need to be changed if your kidney function changes by a significant amount.  What do you do if you miss a dose? If you are taking Xarelto TWICE DAILY and you miss a dose, take it as soon as you remember. You may take two 15 mg tablets (total 30 mg) at the same time then resume your regularly scheduled 15 mg twice daily the next day.  If you are taking Xarelto ONCE DAILY and you miss a dose, take it as soon as you remember on the same day then continue your regularly scheduled once daily regimen the next day. Do not take two doses of Xarelto at the same time.   Important Safety Information Xarelto is a blood thinner medicine that can cause bleeding. You should call your healthcare provider right away if you experience any of the following: ? Bleeding from an injury or your nose that does not stop. ? Unusual colored urine (red or dark brown) or unusual colored stools (red or black). ? Unusual bruising for unknown reasons. ? A serious fall or if you hit your head (even if there is no bleeding).  Some medicines may interact with Xarelto and  might increase your risk of bleeding while on Xarelto. To help avoid this, consult your healthcare provider or pharmacist prior to using any new prescription or non-prescription medications, including herbals, vitamins, non-steroidal anti-inflammatory drugs (NSAIDs) and supplements.  This website has more information on Xarelto: [email or web address]-benson.[email or web address]/.  You were cared for by a hospitalist during your hospital stay. If you have any questions about your discharge medications or the care you received while you were in the hospital after you are discharged, you can call the unit and ask to speak with the hospitalist on call if the hospitalist that took care of you is not available. Once you are discharged, your primary care physician will handle any further medical issues. Please note that NO REFILLS for any discharge medications will be authorized once you are discharged, as it is imperative that you return to your primary care physician (or establish a relationship with a primary care physician if you do not have one) for your aftercare needs so that they can reassess your need for medications and monitor your lab values.

## 2019-06-09 NOTE — Progress Notes (Signed)
Patient taken to MRI by transport, okay to be taken off of heparin drip while in MRI as orderd.

## 2019-06-09 NOTE — Progress Notes (Signed)
ANTICOAGULATION CONSULT NOTE - Initial Consult  Pharmacy Consult for Xarelto Indication: DVT  Allergies  Allergen Reactions  . Morphine And Related Itching    Patient Measurements: Height: 6' (182.9 cm) Weight: 252 lb (114.3 kg) IBW/kg (Calculated) : 77.6  Vital Signs: Temp: 97.8 F (36.6 C) (02/11 0806) Temp Source: Oral (02/11 0806) BP: 154/100 (02/11 0806) Pulse Rate: 101 (02/11 0806)  Labs: Recent Labs    06/08/19 2128 06/09/19 0559  HGB 18.2* 18.4*  HCT 53.7* 52.0  PLT 123* 113*  HEPARINUNFRC  --  0.63  CREATININE 1.54* 1.45*    Estimated Creatinine Clearance: 62.8 mL/min (A) (by C-G formula based on SCr of 1.45 mg/dL (H)).   Medical History: Past Medical History:  Diagnosis Date  . Anxiety   . Diabetes mellitus without complication ([place])   . Gout   . High cholesterol   . Neuropathy   . Thyroid disease    Assessment: CC/HPI: Right foot erythema and ulcerations. Dopplers show femoral vein DVT  PMH: diabetes mellitus type 2, chronic kidney disease stage III, hypertension, hypothyroidism, chronic thrombocytopenia  Anticoag: DVT . IV Heparin>>Xarelto. Hgb 18.4. Plts 113 thought to be chronic. Scr 1.45 with CrCl 62  Goal of Therapy:  Therapeutic oral anticoagulation   Plan:  D/c IV heparin Xarelto 15mg  BID x 21d, then 20mg  daily   Tyreonna Czaplicki S. Alford Highland, PharmD, BCPS Clinical Staff Pharmacist [email or web address] Alford Highland, [employer] 06/09/2019,10:51 AM

## 2019-06-09 NOTE — Progress Notes (Signed)
Patient was educated on 2/11 and is not interested in filling their discharge medications at the [facility].

## 2019-06-09 NOTE — Progress Notes (Signed)
Pharmacy Antibiotic Note  Jonathon [name] is a 69 y.o. male admitted on 06/08/2019 with cellulitis.  Pharmacy has been consulted for Zosyn dosing. Already on Vancomycin. WBC WNL. Noted renal dysfunction.   Plan: Zosyn 3.375G IV q8h to be infused over 4 hours Already on Vancomycin  Height: 6' (182.9 cm) Weight: 252 lb (114.3 kg) IBW/kg (Calculated) : 77.6  Temp (24hrs), Avg:98.5 F (36.9 C), Min:98.2 F (36.8 C), Max:98.7 F (37.1 C)  Recent Labs  Lab 06/08/19 2128  WBC 7.1  CREATININE 1.54*  LATICACIDVEN 1.7    Estimated Creatinine Clearance: 59.1 mL/min (A) (by C-G formula based on SCr of 1.54 mg/dL (H)).    Allergies  Allergen Reactions  . Morphine And Related Itching    Narda Bonds, PharmD, BCPS Clinical Pharmacist Phone: [phone]

## 2019-06-10 LAB — CBC
HCT: 51.6 % (ref 39.0–52.0)
Hemoglobin: 17.3 g/dL — ABNORMAL HIGH (ref 13.0–17.0)
MCH: 32.6 pg (ref 26.0–34.0)
MCHC: 33.5 g/dL (ref 30.0–36.0)
MCV: 97.2 fL (ref 80.0–100.0)
Platelets: 116 10*3/uL — ABNORMAL LOW (ref 150–400)
RBC: 5.31 MIL/uL (ref 4.22–5.81)
RDW: 13.2 % (ref 11.5–15.5)
WBC: 6.1 10*3/uL (ref 4.0–10.5)
nRBC: 0 % (ref 0.0–0.2)

## 2019-06-10 LAB — BASIC METABOLIC PANEL
Anion gap: 13 (ref 5–15)
BUN: 16 mg/dL (ref 8–23)
CO2: 23 mmol/L (ref 22–32)
Calcium: 8.7 mg/dL — ABNORMAL LOW (ref 8.9–10.3)
Chloride: 107 mmol/L (ref 98–111)
Creatinine, Ser: 1.38 mg/dL — ABNORMAL HIGH (ref 0.61–1.24)
GFR calc Af Amer: >60 mL/min (ref >60)
GFR calc non Af Amer: 52 mL/min — ABNORMAL LOW (ref >60)
Glucose, Bld: 109 mg/dL — ABNORMAL HIGH (ref 70–99)
Potassium: 3.9 mmol/L (ref 3.5–5.1)
Sodium: 143 mmol/L (ref 135–145)

## 2019-06-10 LAB — GLUCOSE, CAPILLARY: Glucose-Capillary: 100 mg/dL — ABNORMAL HIGH (ref 70–99)

## 2019-06-10 MED ORDER — VANCOMYCIN HCL 1500 MG/300ML IV SOLN
1500.0000 mg | INTRAVENOUS | Status: DC
  Filled 2019-06-10: qty 300

## 2019-06-10 MED ORDER — DOXYCYCLINE HYCLATE 100 MG PO TABS: 100.0000 mg | ORAL_TABLET | Freq: Two times a day (BID) | ORAL | 0 refills | Status: AC

## 2019-06-10 MED ORDER — RIVAROXABAN 15 MG PO TABS: 15.0000 mg | ORAL_TABLET | Freq: Two times a day (BID) | ORAL | 0 refills | Status: DC

## 2019-06-10 MED ORDER — RIVAROXABAN 20 MG PO TABS: 20.0000 mg | ORAL_TABLET | Freq: Every day | ORAL | 2 refills | Status: DC

## 2019-06-10 NOTE — Discharge Summary (Signed)
Physician Discharge Summary  Nikita Humble YNW:[id number] DOB: 1950-10-27 DOA: 06/08/2019  PCP: Willy Eddy, MD  Admit date: 06/08/2019 Discharge date: 06/10/2019  Admitted From: Home Disposition:  Home  Recommendations for Outpatient Follow-up:  1. Follow up with PCP in 1 week 2. Follow up with Wound care team in 1-2 weeks 3. Please obtain BMP/CBC in 1 week 4. Please consider an outpatient hypercoagulable workup given his family history (daughter) through primary care vs hematology.  Should be off of Xarelto.  Home Health: None  Equipment/Devices: None   Discharge Condition: Good CODE STATUS: Full  Diet recommendation: Heart healthy  Brief/Interim Summary: Patient is a 69 year old male with a past medical history of type 2 diabetes, CKD 3, hypertension, and hypothyroidism who presented to the ED for discharge/ulceration between his toes on the right the day prior to admission.  He has neuropathy so he lacks sensation in his feet.  No recent trauma or change in footwear.  With a complaint of pain in the right lower extremity, Doppler showed a femoral vein DVT.  He was started on a heparin drip.  For possible cellulitis, he was started on vancomycin and Zosyn.  MRI confirmed cellulitis while ruling out osteomyelitis.  The wound care team saw the patient and recommended no further changes.  He will see them on an outpatient basis.  His IV antibiotics were transitioned to doxycycline and his heparin was transitioned to Xarelto.  He will be on 15 mg twice daily for 3 total weeks and then transition to 20 mg daily until follow-up with his PCP to determine length of anticoagulation for this first clot.  Subjective on day of discharge: Feels fine and is ready to go home.  Patient's wife did mention her daughter has some condition that predisposes her to blood clots but does not remember the name.  The patient has never received a hypercoagulable work-up.  Discharge Diagnoses:  Principal  Problem:   Cellulitis of right foot Active Problems:   DVT, lower extremity, distal, acute, right (HCC)   Essential hypertension   CKD (chronic kidney disease) stage 3, GFR 30-59 ml/min   DM (diabetes mellitus), type 2 with renal complications (HCC)   Thrombocytopenia (HCC)   Elevated hemoglobin (HCC)   Hypothyroidism   Diabetic neuropathy (HCC)   Gout   Hyperlipidemia   GAD (generalized anxiety disorder)  Discharge Instructions  Discharge Instructions    Call MD for:  difficulty breathing, headache or visual disturbances   Complete by: As directed    Call MD for:  severe uncontrolled pain   Complete by: As directed    Call MD for:  temperature >100.4   Complete by: As directed    Diet - low sodium heart healthy   Complete by: As directed    Increase activity slowly   Complete by: As directed      Allergies as of 06/10/2019      Reactions   Morphine And Related Itching      Medication List    STOP taking these medications   zolpidem 10 MG tablet Commonly known as: AMBIEN     TAKE these medications   ALPRAZolam 0.25 MG tablet Commonly known as: XANAX Take 0.25 mg by mouth 3 (three) times daily as needed.   amLODipine-benazepril 5-10 MG capsule Commonly known as: LOTREL Take by mouth.   aspirin 81 MG EC tablet 81 mg per 1 tablet, ORAL, Daily, 30 tablet, 0 Refill(s), Activate Med (Rx)   atorvastatin 40 MG tablet Commonly known  as: LIPITOR Take 40 mg by mouth daily.   colchicine 0.6 MG tablet Take 0.6 mg by mouth daily.   doxazosin 4 MG tablet Commonly known as: CARDURA Take 4 mg by mouth daily.   doxycycline 100 MG tablet Commonly known as: VIBRA-TABS Take 1 tablet (100 mg total) by mouth 2 (two) times daily for 7 days.   febuxostat 40 MG tablet Commonly known as: ULORIC Take 40 mg by mouth daily.   gabapentin 300 MG capsule Commonly known as: NEURONTIN Take by mouth.   gemfibrozil 600 MG tablet Commonly known as: LOPID Take 600 mg by mouth 2  (two) times daily before a meal.   hydrochlorothiazide 25 MG tablet Commonly known as: HYDRODIURIL TAKE 1 TABLET BY MOUTH EVERY DAY   ibuprofen 200 MG tablet Commonly known as: ADVIL Take 400 mg by mouth every 6 (six) hours as needed for moderate pain.   indomethacin 50 MG capsule Commonly known as: INDOCIN Take 50 mg by mouth 2 (two) times daily.   Jardiance 10 MG Tabs tablet Generic drug: empagliflozin Take 10 mg by mouth daily.   levothyroxine 112 MCG tablet Commonly known as: SYNTHROID Take 112 mcg by mouth daily before breakfast.   Lyrica 75 MG capsule Generic drug: pregabalin Take 75 mg by mouth 2 (two) times daily.   magnesium oxide 400 MG tablet Commonly known as: MAG-OX Take by mouth.   metFORMIN 500 MG 24 hr tablet Commonly known as: GLUCOPHAGE-XR Take 500 mg by mouth 2 (two) times daily with a meal.   metoprolol tartrate 50 MG tablet Commonly known as: LOPRESSOR Take by mouth.   oxybutynin 10 MG 24 hr tablet Commonly known as: DITROPAN-XL Take 10 mg by mouth at bedtime.   pantoprazole 40 MG tablet Commonly known as: PROTONIX Take 40 mg by mouth 2 (two) times daily.   PARoxetine 40 MG tablet Commonly known as: PAXIL Take 40 mg by mouth daily.   potassium chloride SA 20 MEQ tablet Commonly known as: KLOR-CON Take 20 mEq by mouth 3 (three) times daily.   Rivaroxaban 15 MG Tabs tablet Commonly known as: XARELTO Take 1 tablet (15 mg total) by mouth 2 (two) times daily.   rivaroxaban 20 MG Tabs tablet Commonly known as: XARELTO Take 1 tablet (20 mg total) by mouth daily. Start taking on: June 30, 2019   temazepam 15 MG capsule Commonly known as: RESTORIL Take 15 mg by mouth at bedtime as needed.   testosterone cypionate 100 MG/ML injection Commonly known as: DEPOTESTOTERONE CYPIONATE 100 mg every 14 (fourteen) days.       Allergies  Allergen Reactions  . Morphine And Related Itching    Procedures/Studies: MR FOOT RIGHT WO  CONTRAST  Result Date: 06/09/2019 CLINICAL DATA:  Pain and swelling. Diabetic foot ulcers. EXAM: MRI OF THE RIGHT FOREFOOT WITHOUT CONTRAST TECHNIQUE: Multiplanar, multisequence MR imaging of the right foot was performed. No intravenous contrast was administered. COMPARISON:  Radiographs 06/08/2019 FINDINGS: Moderate dorsal subcutaneous soft tissue swelling/edema/fluid suggesting cellulitis. No discrete fluid collection to suggest a soft tissue abscess. Mild to moderate myositis involving the plantar foot musculature without definite findings for pyomyositis. No MR findings suspicious for septic arthritis or osteomyelitis. The major tendons and ligaments appear intact. No findings for septic tenosynovitis. IMPRESSION: 1. Cellulitis and myositis but no discrete soft tissue abscess or pyomyositis. 2. No MR findings for septic arthritis or osteomyelitis. Electronically Signed   By: Marijo Sanes M.D.   On: 06/09/2019 09:40   US Venous Img  Lower Unilateral Right (DVT)  Addendum Date: 06/08/2019   ADDENDUM REPORT: 06/08/2019 20:06 ADDENDUM: Multiple attempts were made to contact the referring provider office without success. The patient was instructed to go to the emergency room for further evaluation. Electronically Signed   By: Audie Pinto M.D.   On: 06/08/2019 20:06   Result Date: 06/08/2019 CLINICAL DATA:  Right lower extremity pain for 1 week. EXAM: RIGHT LOWER EXTREMITY VENOUS DOPPLER ULTRASOUND TECHNIQUE: Gray-scale sonography with compression, as well as color and duplex ultrasound, were performed to evaluate the deep venous system(s) from the level of the common femoral vein through the popliteal and proximal calf veins. COMPARISON:  None. FINDINGS: VENOUS There is incomplete compression of the distal femoral vein. There is evidence of nonocclusive thrombus on grayscale images in the distal femoral vein. There is normal compressibility of the common femoral, proximal to mid femoral vein,  superficial femoral, and popliteal veins, as well as the visualized calf veins. Visualized portions of profunda femoral vein and great saphenous vein unremarkable. Doppler waveforms show normal direction of venous flow, normal respiratory phasicity and response to augmentation. Limited views of the contralateral common femoral vein are unremarkable. OTHER None. Limitations: none IMPRESSION: Nonocclusive thrombus in the distal right femoral vein. The remainder of the deep venous system appears is patent without thrombus. Electronically Signed: By: Audie Pinto M.D. On: 06/08/2019 19:52   DG Foot Complete Right  Result Date: 06/08/2019 CLINICAL DATA:  Diabetes, right foot infection EXAM: RIGHT FOOT COMPLETE - 3+ VIEW COMPARISON:  None. FINDINGS: Frontal, oblique, lateral views of the right foot are obtained. No acute displaced fractures. Mild osteoarthritis of the first metatarsophalangeal joint. Diffuse vascular calcifications are identified. The soft tissues are otherwise unremarkable. No radiographic evidence of osteomyelitis. IMPRESSION: 1. No acute or destructive bony lesions. Electronically Signed   By: Randa Ngo M.D.   On: 06/08/2019 21:53       Discharge Exam: Vitals:   06/09/19 2003 06/10/19 0247  BP: 134/78 (!) 128/99  Pulse: 91 87  Resp: 17 17  Temp: 98.7 F (37.1 C)   SpO2: 95% 94%     General: Pt is alert, awake, not in acute distress Cardiovascular: RRR, S1/S2 +, no edema Respiratory: CTA bilaterally, no wheezing, no rhonchi, no respiratory distress, no conversational dyspnea  Abdominal: Soft, NT, ND, bowel sounds + Skin: Ulcerations between digits of R foot, no foul odor or active drainage; there is dorsal erythema that is improved; there is no mid foot ulceration Psych: Normal mood and affect, stable judgement and insight     The results of significant diagnostics from this hospitalization (including imaging, microbiology, ancillary and laboratory) are listed  below for reference.     Microbiology: Recent Results (from the past 240 hour(s))  Blood culture (routine x 2)     Status: None (Preliminary result)   Collection Time: 06/08/19  9:28 PM   Specimen: BLOOD  Result Value Ref Range Status   Specimen Description   Final    BLOOD RIGHT ANTECUBITAL Performed at [facility], [address] [place], [place] [address]    Special Requests   Final    BOTTLES DRAWN AEROBIC AND ANAEROBIC Blood Culture adequate volume Performed at [facility], [address] [place], [place] [address]    Culture   Final    NO GROWTH 2 DAYS Performed at [facility], [place] [address], [place], [place] [address]    Report Status PENDING  Incomplete  Respiratory Panel by RT PCR (Flu A&B, Covid) - Nasopharyngeal Swab     Status: None   Collection Time: 06/08/19  9:28 PM   Specimen: Nasopharyngeal Swab  Result Value Ref Range Status   SARS Coronavirus 2 by RT PCR NEGATIVE NEGATIVE Final    Comment: (NOTE) SARS-CoV-2 target nucleic acids are NOT DETECTED. The SARS-CoV-2 RNA is generally detectable in upper respiratoy specimens during the acute phase of infection. The lowest concentration of SARS-CoV-2 viral copies this assay can detect is 131 copies/mL. A negative result does not preclude SARS-Cov-2 infection and should not be used as the sole basis for treatment or other patient management decisions. A negative result may occur with  improper specimen collection/handling, submission of specimen other than nasopharyngeal swab, presence of viral mutation(s) within the areas targeted by this assay, and inadequate number of viral copies (<131 copies/mL). A negative result must be combined with clinical observations, patient history, and epidemiological information. The expected result is Negative. Fact Sheet for Patients:  PinkCheek.be Fact Sheet for Healthcare Providers:   GravelBags.it This test is not yet ap proved or cleared by the [place] FDA and  has been authorized for detection and/or diagnosis of SARS-CoV-2 by FDA under an Emergency Use Authorization (EUA). This EUA will remain  in effect (meaning this test can be used) for the duration of the COVID-19 declaration under Section 564(b)(1) of the Act, 21 U.S.C. section 360bbb-3(b)(1), unless the authorization is terminated or revoked sooner.    Influenza A by PCR NEGATIVE NEGATIVE Final   Influenza B by PCR NEGATIVE NEGATIVE Final    Comment: (NOTE) The Xpert Xpress SARS-CoV-2/FLU/RSV assay is intended as an aid in  the diagnosis of influenza from Nasopharyngeal swab specimens and  should not be used as a sole basis for treatment. Nasal washings and  aspirates are unacceptable for Xpert Xpress SARS-CoV-2/FLU/RSV  testing. Fact Sheet for Patients: PinkCheek.be Fact Sheet for Healthcare Providers: GravelBags.it This test is not yet approved or cleared by the [place] FDA and  has been authorized for detection and/or diagnosis of SARS-CoV-2 by  FDA under an Emergency Use Authorization (EUA). This EUA will remain  in effect (meaning this test can be used) for the duration of the  Covid-19 declaration under Section 564(b)(1) of the Act, 21  U.S.C. section 360bbb-3(b)(1), unless the authorization is  terminated or revoked. Performed at [facility], [place] [address] [place], [place] [address]   Blood culture (routine x 2)     Status: None (Preliminary result)   Collection Time: 06/08/19  9:40 PM   Specimen: BLOOD  Result Value Ref Range Status   Specimen Description   Final    BLOOD LEFT ANTECUBITAL Performed at [facility], [address] [place], [place] [address]    Special Requests   Final    BOTTLES DRAWN AEROBIC AND ANAEROBIC Blood Culture adequate volume Performed at [facility], [address] [place], [place] [address]    Culture   Final    NO GROWTH 2 DAYS Performed at [facility], [place] [address], [place], [place] [address]    Report Status PENDING  Incomplete     Labs: BNP (last 3 results) No results for input(s): BNP in the last 8760 hours. Basic Metabolic Panel: Recent Labs  Lab 06/08/19 2128 06/09/19 0559 06/10/19 0740  NA 137 140 143  K 3.8 3.0* 3.9  CL 100 100 107  CO2  27 24 23   GLUCOSE 125* 132* 109*  BUN 20 17 16   CREATININE 1.54* 1.45* 1.38*  CALCIUM 9.0 9.1 8.7*   Liver Function Tests: Recent Labs  Lab 06/08/19 2128 06/09/19 0559  AST 22 19  ALT 19 19  ALKPHOS 46 42  BILITOT 1.7* 1.7*  PROT 7.2 6.6  ALBUMIN 3.7 3.3*   No results for input(s): LIPASE, AMYLASE in the last 168 hours. No results for input(s): AMMONIA in the last 168 hours. CBC: Recent Labs  Lab 06/08/19 2128 06/09/19 0559 06/10/19 0740  WBC 7.1 5.8 6.1  NEUTROABS 5.2 4.2  --   HGB 18.2* 18.4* 17.3*  HCT 53.7* 52.0 51.6  MCV 96.6 94.7 97.2  PLT 123* 113* 116*   Cardiac Enzymes: No results for input(s): CKTOTAL, CKMB, CKMBINDEX, TROPONINI in the last 168 hours. BNP: Invalid input(s): POCBNP CBG: Recent Labs  Lab 06/09/19 0620 06/09/19 1135 06/09/19 1637 06/09/19 2053 06/10/19 0629  GLUCAP 118* 126* 113* 107* 100*   D-Dimer No results for input(s): DDIMER in the last 72 hours. Hgb A1c No results for input(s): HGBA1C in the last 72 hours. Lipid Profile No results for input(s): CHOL, HDL, LDLCALC, TRIG, CHOLHDL, LDLDIRECT in the last 72 hours. Thyroid function studies No results for input(s): TSH, T4TOTAL, T3FREE, THYROIDAB in the last 72 hours.  Invalid input(s): FREET3 Anemia work up No results for input(s): VITAMINB12, FOLATE, FERRITIN, TIBC, IRON, RETICCTPCT in the last 72 hours. Urinalysis No results found for: COLORURINE, APPEARANCEUR, [place], [place], GLUCOSEU, [place], [place], [place], [place],  UROBILINOGEN, NITRITE, LEUKOCYTESUR Sepsis Labs Invalid input(s): PROCALCITONIN,  WBC,  LACTICIDVEN Microbiology Recent Results (from the past 240 hour(s))  Blood culture (routine x 2)     Status: None (Preliminary result)   Collection Time: 06/08/19  9:28 PM   Specimen: BLOOD  Result Value Ref Range Status   Specimen Description   Final    BLOOD RIGHT ANTECUBITAL Performed at [facility], [address] [place], [place] [address]    Special Requests   Final    BOTTLES DRAWN AEROBIC AND ANAEROBIC Blood Culture adequate volume Performed at [facility], [address] [place], [place] [address]    Culture   Final    NO GROWTH 2 DAYS Performed at [facility], [place] [address] [place], [place] [address]    Report Status PENDING  Incomplete  Respiratory Panel by RT PCR (Flu A&B, Covid) - Nasopharyngeal Swab     Status: None   Collection Time: 06/08/19  9:28 PM   Specimen: Nasopharyngeal Swab  Result Value Ref Range Status   SARS Coronavirus 2 by RT PCR NEGATIVE NEGATIVE Final    Comment: (NOTE) SARS-CoV-2 target nucleic acids are NOT DETECTED. The SARS-CoV-2 RNA is generally detectable in upper respiratoy specimens during the acute phase of infection. The lowest concentration of SARS-CoV-2 viral copies this assay can detect is 131 copies/mL. A negative result does not preclude SARS-Cov-2 infection and should not be used as the sole basis for treatment or other patient management decisions. A negative result may occur with  improper specimen collection/handling, submission of specimen other than nasopharyngeal swab, presence of viral mutation(s) within the areas targeted by this assay, and inadequate number of viral copies (<131 copies/mL). A negative result must be combined with clinical observations, patient history, and epidemiological information. The expected result is Negative. Fact Sheet for Patients:   PinkCheek.be Fact Sheet for Healthcare Providers:  GravelBags.it This test is not yet  ap proved or cleared by the [place] and  has been authorized for detection and/or diagnosis of SARS-CoV-2 by FDA under an Emergency Use Authorization (EUA). This EUA will remain  in effect (meaning this test can be used) for the duration of the COVID-19 declaration under Section 564(b)(1) of the Act, 21 U.S.C. section 360bbb-3(b)(1), unless the authorization is terminated or revoked sooner.    Influenza A by PCR NEGATIVE NEGATIVE Final   Influenza B by PCR NEGATIVE NEGATIVE Final    Comment: (NOTE) The Xpert Xpress SARS-CoV-2/FLU/RSV assay is intended as an aid in  the diagnosis of influenza from Nasopharyngeal swab specimens and  should not be used as a sole basis for treatment. Nasal washings and  aspirates are unacceptable for Xpert Xpress SARS-CoV-2/FLU/RSV  testing. Fact Sheet for Patients: PinkCheek.be Fact Sheet for Healthcare Providers: GravelBags.it This test is not yet approved or cleared by the [place] FDA and  has been authorized for detection and/or diagnosis of SARS-CoV-2 by  FDA under an Emergency Use Authorization (EUA). This EUA will remain  in effect (meaning this test can be used) for the duration of the  Covid-19 declaration under Section 564(b)(1) of the Act, 21  U.S.C. section 360bbb-3(b)(1), unless the authorization is  terminated or revoked. Performed at [facility], [place] [address] [place], [place] [address]   Blood culture (routine x 2)     Status: None (Preliminary result)   Collection Time: 06/08/19  9:40 PM   Specimen: BLOOD  Result Value Ref Range Status   Specimen Description   Final    BLOOD LEFT ANTECUBITAL Performed at [facility], [address] [place], [place] [address]    Special Requests   Final     BOTTLES DRAWN AEROBIC AND ANAEROBIC Blood Culture adequate volume Performed at [facility], [address] [place], [place] [address]    Culture   Final    NO GROWTH 2 DAYS Performed at [facility], [place] [address], [place], [place] [address]    Report Status PENDING  Incomplete     Patient was seen and examined on the day of discharge and was found to be in stable condition. Time coordinating discharge: 35 minutes including assessment and coordination of care, as well as examination of the patient.   SIGNED:  Shelda Pal, DO [facility] 06/10/2019, 1:32 PM

## 2019-06-10 NOTE — Progress Notes (Signed)
Pharmacy Antibiotic Note  Jonathon [name] is a 69 y.o. male admitted on 06/08/2019 with cellulitis.  Pharmacy has been consulted for Vanco/Zosyn dosing.   ID: Zosyn + Vanc for cellulitis/ foot wounds. WBC WNL. Afebrile. Scr trending down.  MRI shows no osteomyelitis. Possibly conversion to po abx with discharge today.  2/10: BC x 2>>  Vanco 2/11>> Zosyn 2/11>>  -Vancomycin 1500 mg IV Q 24 hrs. Goal AUC 400-550. Expected AUC: 520.5 SCr used: 1.38, (VD 0.5)  Plan: Increase Vanco 1500mg  IV q24h on 2/12. Zosyn 3.375g IV q8hr. Ok to d/c home on Keflex/Amoxil or Augmentin.    Height: 6' (182.9 cm) Weight: 252 lb (114.3 kg) IBW/kg (Calculated) : 77.6  Temp (24hrs), Avg:98.7 F (37.1 C), Min:98.6 F (37 C), Max:98.7 F (37.1 C)  Recent Labs  Lab 06/08/19 2128 06/09/19 0559 06/10/19 0740  WBC 7.1 5.8 6.1  CREATININE 1.54* 1.45* 1.38*  LATICACIDVEN 1.7  --   --     Estimated Creatinine Clearance: 66 mL/min (A) (by C-G formula based on SCr of 1.38 mg/dL (H)).    Allergies  Allergen Reactions  . Morphine And Related Itching    Kymia Simi S. Alford Highland, PharmD, BCPS Clinical Staff Pharmacist [email or web address]  Wayland Salinas 06/10/2019 9:23 AM

## 2019-06-10 NOTE — Consult Note (Signed)
WOC Nurse Consult Note: Reason for Consult: foot wounds Wound type:right foot:  full thickness and partial thickness skin loss between the 1, 2, 2, 4 toes. Intact discoloration with streaking on the same foot Pressure Injury POA/NA Measurement: 0.3cm x 0.3 areas between the great toe and the first toe; some yellow/dark fibrinous material  0.2, c 0.2cm areas between the 2nd, 3th, and 4th toes; partial thickness openings; 100% clean and pink Intact discolored skin; presents like deep tissue injury? 15cm x 3.5cm x 0cm  Wound bed: see above  Drainage (amount, consistency, odor) minimal, serosanguinous  Periwound: distal pulse weak, foot cooler than left foot, edematous  Dressing procedure/placement/frequency: Silver hydrofiber weave between the affected right toes;  Cover with dry dressing. Wrap with kerlix, change daily.  Discussed POC with patient and bedside nurse.  Re consult if needed, will not follow at this time. Thanks  Alvita Fana [employer], RN,CWOCN, CNS, [place] [phone])

## 2019-06-13 LAB — CULTURE, BLOOD (ROUTINE X 2)
Culture: NO GROWTH
Culture: NO GROWTH
Special Requests: ADEQUATE
Special Requests: ADEQUATE

## 2019-06-14 DIAGNOSIS — N183 Chronic kidney disease, stage 3 unspecified: Secondary | ICD-10-CM | POA: Diagnosis not present

## 2019-06-14 DIAGNOSIS — I10 Essential (primary) hypertension: Secondary | ICD-10-CM | POA: Diagnosis not present

## 2019-06-14 DIAGNOSIS — L03119 Cellulitis of unspecified part of limb: Secondary | ICD-10-CM | POA: Diagnosis not present

## 2019-06-14 DIAGNOSIS — G629 Polyneuropathy, unspecified: Secondary | ICD-10-CM | POA: Diagnosis not present

## 2019-06-14 DIAGNOSIS — E039 Hypothyroidism, unspecified: Secondary | ICD-10-CM | POA: Diagnosis not present

## 2019-06-14 DIAGNOSIS — E785 Hyperlipidemia, unspecified: Secondary | ICD-10-CM | POA: Diagnosis not present

## 2019-06-14 DIAGNOSIS — E1140 Type 2 diabetes mellitus with diabetic neuropathy, unspecified: Secondary | ICD-10-CM | POA: Diagnosis not present

## 2019-06-14 DIAGNOSIS — Z7984 Long term (current) use of oral hypoglycemic drugs: Secondary | ICD-10-CM | POA: Diagnosis not present

## 2019-06-14 DIAGNOSIS — I82409 Acute embolism and thrombosis of unspecified deep veins of unspecified lower extremity: Secondary | ICD-10-CM | POA: Diagnosis not present

## 2019-06-16 ENCOUNTER — Telehealth: Payer: Self-pay | Admitting: Hematology

## 2019-06-16 NOTE — Telephone Encounter (Signed)
Received a new hem referral from Dr. Olen Pel for Jonathon [name]. [name] has been cld and scheduled to see Dr. Irene Limbo on 3/3 at 1pm. Pt aware to arrive 15 mint\utes early.

## 2019-06-17 DIAGNOSIS — E291 Testicular hypofunction: Secondary | ICD-10-CM | POA: Diagnosis not present

## 2019-06-22 ENCOUNTER — Other Ambulatory Visit: Payer: Self-pay

## 2019-06-22 ENCOUNTER — Encounter (HOSPITAL_BASED_OUTPATIENT_CLINIC_OR_DEPARTMENT_OTHER): Payer: Medicare Other | Attending: Physician Assistant | Admitting: Physician Assistant

## 2019-06-22 DIAGNOSIS — I7389 Other specified peripheral vascular diseases: Secondary | ICD-10-CM | POA: Insufficient documentation

## 2019-06-22 DIAGNOSIS — L97519 Non-pressure chronic ulcer of other part of right foot with unspecified severity: Secondary | ICD-10-CM | POA: Diagnosis not present

## 2019-06-22 DIAGNOSIS — E11621 Type 2 diabetes mellitus with foot ulcer: Secondary | ICD-10-CM | POA: Insufficient documentation

## 2019-06-22 DIAGNOSIS — Z86718 Personal history of other venous thrombosis and embolism: Secondary | ICD-10-CM | POA: Insufficient documentation

## 2019-06-22 DIAGNOSIS — Z87891 Personal history of nicotine dependence: Secondary | ICD-10-CM | POA: Insufficient documentation

## 2019-06-22 DIAGNOSIS — Z7901 Long term (current) use of anticoagulants: Secondary | ICD-10-CM | POA: Insufficient documentation

## 2019-06-22 DIAGNOSIS — I96 Gangrene, not elsewhere classified: Secondary | ICD-10-CM | POA: Diagnosis not present

## 2019-06-22 DIAGNOSIS — N186 End stage renal disease: Secondary | ICD-10-CM | POA: Insufficient documentation

## 2019-06-22 DIAGNOSIS — S91104A Unspecified open wound of right lesser toe(s) without damage to nail, initial encounter: Secondary | ICD-10-CM | POA: Diagnosis not present

## 2019-06-22 DIAGNOSIS — I82411 Acute embolism and thrombosis of right femoral vein: Secondary | ICD-10-CM | POA: Insufficient documentation

## 2019-06-22 DIAGNOSIS — E1122 Type 2 diabetes mellitus with diabetic chronic kidney disease: Secondary | ICD-10-CM | POA: Insufficient documentation

## 2019-06-22 DIAGNOSIS — I120 Hypertensive chronic kidney disease with stage 5 chronic kidney disease or end stage renal disease: Secondary | ICD-10-CM | POA: Insufficient documentation

## 2019-06-22 DIAGNOSIS — L97518 Non-pressure chronic ulcer of other part of right foot with other specified severity: Secondary | ICD-10-CM | POA: Insufficient documentation

## 2019-06-23 ENCOUNTER — Ambulatory Visit (HOSPITAL_COMMUNITY): Admission: RE | Admit: 2019-06-23 | Payer: Medicare Other | Source: Ambulatory Visit

## 2019-06-23 ENCOUNTER — Other Ambulatory Visit (HOSPITAL_COMMUNITY): Payer: Self-pay | Admitting: Physician Assistant

## 2019-06-23 ENCOUNTER — Encounter: Payer: Self-pay | Admitting: Vascular Surgery

## 2019-06-23 ENCOUNTER — Ambulatory Visit (INDEPENDENT_AMBULATORY_CARE_PROVIDER_SITE_OTHER)
Admission: RE | Admit: 2019-06-23 | Discharge: 2019-06-23 | Disposition: A | Discharge status: Home / Self Care | Payer: Medicare Other | Source: Ambulatory Visit | Attending: Vascular Surgery | Admitting: Vascular Surgery

## 2019-06-23 ENCOUNTER — Encounter (HOSPITAL_COMMUNITY): Payer: Self-pay

## 2019-06-23 ENCOUNTER — Other Ambulatory Visit: Payer: Self-pay

## 2019-06-23 ENCOUNTER — Ambulatory Visit (INDEPENDENT_AMBULATORY_CARE_PROVIDER_SITE_OTHER): Payer: Medicare Other | Admitting: Vascular Surgery

## 2019-06-23 VITALS — BP 140/79 | HR 77 | Temp 98.0°F | Resp 20 | Ht 72.0 in | Wt 252.0 lb

## 2019-06-23 DIAGNOSIS — I96 Gangrene, not elsewhere classified: Secondary | ICD-10-CM

## 2019-06-23 DIAGNOSIS — R0989 Other specified symptoms and signs involving the circulatory and respiratory systems: Secondary | ICD-10-CM

## 2019-06-23 DIAGNOSIS — I739 Peripheral vascular disease, unspecified: Secondary | ICD-10-CM | POA: Diagnosis not present

## 2019-06-23 NOTE — Progress Notes (Signed)
PAVEL, GADD ([id number]) Visit Report for 06/22/2019 Chief Complaint Document Details Patient Name: Date of Service: Jonathon [name], Jonathon [name] 06/22/2019 2:45 PM Medical Record [id number] Patient Account Number: [id number] Date of Birth/Sex: Treating RN: Apr 02, 1951 (68 y.o. Ernestene Mention Primary Care Provider: [name] Other Clinician: Referring Provider: Treating Provider/Extender:Stone III, Shelby Dubin, KRISTEN Weeks in Treatment: 0 Information Obtained from: Patient Chief Complaint Right great toe ulcer Electronic Signature(s) Signed: 06/22/2019 3:35:44 PM By: Worthy Keeler PA-C Entered By: Worthy Keeler on 06/22/2019 15:35:44 -------------------------------------------------------------------------------- HPI Details Patient Name: Date of Service: Jonathon [name], Jonathon [name] 06/22/2019 2:45 PM Medical Record [id number] Patient Account Number: [id number] Date of Birth/Sex: Treating RN: [birth year] (69 y.o. Ernestene Mention Primary Care Provider: [name] Other Clinician: Referring Provider: Treating Provider/Extender:Stone III, Shelby Dubin, KRISTEN Weeks in Treatment: 0 History of Present Illness HPI Description: 06/22/2019 upon evaluation today patient actually appears to be doing somewhat poorly upon initial inspection here in the clinic. He is here for the wound that he actually has on his right great toe and second toe. On the great toe the wound is laterally on the second toe is medially. Subsequently and fortunately there does not appear to be a whole lot of drainage at this point which is good news. However there is cyanosis, coolness to touch, and eschar noted over the great toe in particular which has me extremely concerned about peripheral vascular disease. We also were not able to obtain ABIs as when the blood pressure cuff was utilized the patient really had no audible pulse noted. Unfortunately I feel like that this is something that is  going to need to be addressed as soon as possible. He was just in the hospital from 06/08/2019 until discharge. 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 With that being said he is on Xarelto at this point 15 mg right now he be on 20 mg starting on Friday. Subsequently the patient also apparently has evidence of peripheral vascular disease based on what I am seeing today. I do believe this is going to be something that is absolutely important for him to be seen by vascular very quickly as to be honest this seems to be progressing quite significantly and rapidly based on what the patient saying. The erythema has improved he has been on doxycycline upon discharge. When he was in the hospital he was given vancomycin and Zosyn. He was discharged with the doxycycline unfortunately an MRI was performed on 06/09/2019 which was negative for any abscess or osteomyelitis. The patient does have a history of diabetes mellitus type 2. He has not had arterial studies otherwise at this point. Electronic Signature(s) Signed: 06/22/2019 3:57:16 PM By: Worthy Keeler PA-C Entered By: Worthy Keeler on 06/22/2019 15:57:15 -------------------------------------------------------------------------------- Physical Exam Details Patient Name: Date of Service: Jonathon [name], Jonathon [name] 06/22/2019 2:45 PM Medical Record [id number] Patient Account Number: [id number] Date of Birth/Sex: Treating RN: [birth year] (69 y.o. Ernestene Mention Primary Care Provider: [name] Other Clinician: Referring Provider: Treating Provider/Extender:Stone III, Shelby Dubin, KRISTEN Weeks in Treatment: 0 Constitutional sitting or standing blood pressure is within target range for patient.. pulse regular and within target range for patient.[name] respirations regular, non-labored and within target range for patient.[name] temperature within target range for patient.. Well-nourished and well-hydrated in no acute  distress. Eyes conjunctiva clear no eyelid edema noted. pupils equal round and reactive to light and accommodation. Ears, Nose, Mouth, and Throat no gross abnormality of ear auricles or external auditory canals. normal hearing noted  during conversation. mucus membranes moist. Respiratory normal breathing without difficulty. Cardiovascular Absent posterior tibial and dorsalis pedis pulses bilateral lower extremities. Gastrointestinal (GI) soft, non-tender, non-distended, +BS. no ventral hernia noted. Musculoskeletal normal gait and posture. no significant deformity or arthritic changes, no loss or range of motion, no clubbing. Psychiatric this patient is able to make decisions and demonstrates good insight into disease process. Alert and Oriented x 3. pleasant and cooperative. Notes Upon inspection today patient's great toe on the right actually shows on the lateral aspect evidence of gangrene at this time. That does have me extremely concerned to be honest. I also am at least pleased that this does not appear to be wet and draining but is for the most part dry I am hoping that we can keep it that way. With that being said he unfortunately though he does not have significant edema of the lower extremities does have cyanosis noted of the first and second toe on the right foot. That has me very concerned as well at this point. His capillary refill was also very poor at this time definitely greater than 3 seconds. His foot was also cool to touch especially in and around this toe. I do believe that he is likely experiencing critical limb ischemia and needs to be evaluated as soon as possible. Electronic Signature(s) Signed: 06/22/2019 3:58:22 PM By: Worthy Keeler PA-C Entered By: Worthy Keeler on 06/22/2019 15:58:22 -------------------------------------------------------------------------------- Physician Orders Details Patient Name: Date of Service: Jonathon [name], Jonathon [name] 06/22/2019 2:45  PM Medical Record [id number] Patient Account Number: [id number] Date of Birth/Sex: Treating RN: 1950/05/01 (69 y.o. Ernestene Mention Primary Care Provider: [name] Other Clinician: Referring Provider: Treating Provider/Extender:Stone III, Shelby Dubin, KRISTEN Weeks in Treatment: 0 Verbal / Phone Orders: No Diagnosis Coding ICD-10 Coding Code Description E11.621 Type 2 diabetes mellitus with foot ulcer L97.518 Non-pressure chronic ulcer of other part of right foot with other specified severity I73.89 Other specified peripheral vascular diseases I82.491 Acute embolism and thrombosis of other specified deep vein of right lower extremity Follow-up Appointments Return Appointment in 1 week. Dressing Change Frequency Wound #1 Right Toe Great Change dressing every day. Wound #2 Right Toe Second Change dressing every day. Wound Cleansing May shower and wash wound with soap and water. 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 Signature(s): Date(s): Prescription 06/22/2019 [name], [place] Provider: Worthy Keeler PA Date of Birth: [birth year] NPI#: [id number] Sex: Jerilynn Mages DEA#: [id number] Phone #: [phone] License #: Patient Address: [address] [place], [place] [address] [place], [place] [address] [phone] Allergies morphine Reaction: itching Provider's Orders Vascular - ICD10: L97.518 - 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 Ernestene Mention Primary Care Provider: [name] Other Clinician: Referring Provider: Treating Provider/Extender:Stone III, Shelby Dubin, KRISTEN Weeks in Treatment: 0 Active  Problems ICD-10 Evaluated Encounter Code Description Active Date Today Diagnosis E11.621 Type 2 diabetes mellitus with foot ulcer 06/22/2019 No Yes L97.518 Non-pressure chronic ulcer of other part of right foot 06/22/2019 No Yes with other specified severity I73.89 Other specified peripheral vascular diseases 06/22/2019 No Yes I82.491 Acute embolism and thrombosis of other specified 06/22/2019 No Yes deep vein of right lower extremity Inactive Problems Resolved Problems Electronic Signature(s) Signed: 06/22/2019 3:35:22 PM By: Worthy Keeler PA-C Previous Signature: 06/22/2019 3:34:15 PM Version By: Worthy Keeler PA-C Entered By: Worthy Keeler on 06/22/2019 15:35:21 -------------------------------------------------------------------------------- Progress Note Details Patient Name: Date of Service: Jonathon [name], Jonathon [name] 06/22/2019 2:45 PM Medical Record [id number] Patient Account Number: [id number] Date of Birth/Sex: Treating RN: 1950-07-20 (68 y.o. Ernestene Mention Primary Care Provider: [name] Other Clinician: Referring Provider: Treating Provider/Extender:Stone III, Shelby Dubin, KRISTEN Weeks in Treatment: 0 Subjective Chief Complaint Information obtained from Patient Right great toe ulcer History of Present Illness (HPI) 06/22/2019 upon evaluation today patient actually appears to be doing somewhat poorly upon initial inspection here in the clinic. He is here for the wound that he actually has on his right great toe and second toe. On the great toe the wound is laterally on the second toe is medially. Subsequently and fortunately there does not appear to be a whole lot of drainage at this point which is good news. However there is cyanosis, coolness to touch, and eschar noted over the great toe in particular which has me extremely concerned about peripheral vascular disease. We also were not able to obtain ABIs as when the blood pressure cuff was utilized the  patient really had no audible pulse noted. Unfortunately I feel like that this is something that is going to need to be addressed as soon as possible. He was just in the hospital from 06/08/2019 until discharge. 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 With that being said he is on Xarelto at this point 15 mg right now he be on 20 mg starting on Friday. Subsequently the patient also apparently has evidence of peripheral vascular disease based on what I am seeing today. I do believe this is going to be something that is absolutely important for him to be seen by vascular very quickly as to be honest this seems to be progressing quite significantly and rapidly based on what the patient saying. The erythema  has improved he has been on doxycycline upon discharge. When he was in the hospital he was given vancomycin and Zosyn. He was discharged with the doxycycline unfortunately an MRI was performed on 06/09/2019 which was negative for any abscess or osteomyelitis. The patient does have a history of diabetes mellitus type 2. He has not had arterial studies otherwise at this point. Patient History Information obtained from Patient. Allergies morphine (Reaction: itching) Family History Cancer - Siblings,Mother, Hypertension - Mother,Father, Lung Disease - Mother, Stroke - Father, No family history of Diabetes, Heart Disease, Hereditary Spherocytosis, Kidney Disease, Seizures, Thyroid Problems, Tuberculosis. Social History Former smoker - quit 37 years ago, Marital Status - Married, Alcohol Use - Daily - 1-2 drinks a night, Drug Use - Current History - marijuana, Caffeine Use - Daily - 1 coffee. Medical History Eyes Denies history of Cataracts, Glaucoma, Optic Neuritis Ear/Nose/Mouth/Throat Denies history of Chronic sinus problems/congestion, Middle ear problems Hematologic/Lymphatic Denies history of Anemia, Hemophilia, Jonathon [name] Immunodeficiency Virus, Lymphedema, Sickle Cell  Disease Respiratory Denies history of Aspiration, Asthma, Chronic Obstructive Pulmonary Disease (COPD), Pneumothorax, Sleep Apnea, Tuberculosis Cardiovascular Patient has history of Deep Vein Thrombosis, Hypertension - per patient no longer on BP medication Denies history of Angina, Arrhythmia, Congestive Heart Failure, Coronary Artery Disease, Hypotension, Myocardial Infarction, Peripheral Arterial Disease, Peripheral Venous Disease, Phlebitis, Vasculitis Gastrointestinal Denies history of Cirrhosis , Colitis, Crohnoos, Hepatitis A, Hepatitis B, Hepatitis C Endocrine Patient has history of Type II Diabetes Genitourinary Patient has history of End Stage Renal Disease - stage III Immunological Denies history of Lupus Erythematosus, Raynaudoos, Scleroderma Integumentary (Skin) Denies history of History of Burn Musculoskeletal Patient has history of Gout Denies history of Rheumatoid Arthritis, Osteoarthritis, Osteomyelitis Neurologic Patient has history of Neuropathy Denies history of Dementia, Quadriplegia, Paraplegia, Seizure Disorder Oncologic Denies history of Received Chemotherapy, Received Radiation Psychiatric Denies history of Anorexia/bulimia, Confinement Anxiety Patient is treated with Controlled Diet, Oral Agents. Blood sugar is not tested. Hospitalization/Surgery History - DVT and cellulitis- [facility] 06/01/2019. - 10 year ago [place] remove a tooth. - renal gland remove-15 years ago. Review of Systems (ROS) Constitutional Symptoms (General Health) Denies complaints or symptoms of Fatigue, Fever, Chills, Marked Weight Change. Eyes Complains or has symptoms of Glasses / Contacts. Denies complaints or symptoms of Dry Eyes, Vision Changes. Ear/Nose/Mouth/Throat Denies complaints or symptoms of Chronic sinus problems or rhinitis. Respiratory Denies complaints or symptoms of Chronic or frequent coughs, Shortness of Breath. Gastrointestinal Denies complaints or  symptoms of Frequent diarrhea, Nausea, Vomiting. Endocrine Denies complaints or symptoms of Heat/cold intolerance. Genitourinary Denies complaints or symptoms of Frequent urination. Integumentary (Skin) Complains or has symptoms of Wounds - right foot. Musculoskeletal Denies complaints or symptoms of Muscle Pain, Muscle Weakness. Neurologic Denies complaints or symptoms of Numbness/parasthesias. Psychiatric Denies complaints or symptoms of Claustrophobia, Suicidal. Objective Constitutional sitting or standing blood pressure is within target range for patient.. pulse regular and within target range for patient.[name] respirations regular, non-labored and within target range for patient.[name] temperature within target range for patient.. Well-nourished and well-hydrated in no acute distress. Vitals Time Taken: 2:42 PM, Height: 72 in, Source: Stated, Weight: 245 lbs, Source: Stated, BMI: 33.2, Temperature: 98 F, Pulse: 79 bpm, Respiratory Rate: 20 breaths/min, Blood Pressure: 121/76 mmHg. Eyes conjunctiva clear no eyelid edema noted. pupils equal round and reactive to light and accommodation. Ears, Nose, Mouth, and Throat no gross abnormality of ear auricles or external auditory canals. normal hearing noted during conversation. mucus membranes moist. Respiratory normal breathing without difficulty. Cardiovascular Absent posterior  tibial and dorsalis pedis pulses bilateral lower extremities. Gastrointestinal (GI) soft, non-tender, non-distended, +BS. no ventral hernia noted. Musculoskeletal normal gait and posture. no significant deformity or arthritic changes, no loss or range of motion, no clubbing. Psychiatric this patient is able to make decisions and demonstrates good insight into disease process. Alert and Oriented x 3. pleasant and cooperative. General Notes: Upon inspection today patient's great toe on the right actually shows on the lateral aspect evidence of gangrene at this time.  That does have me extremely concerned to be honest. I also am at least pleased that this does not appear to be wet and draining but is for the most part dry I am hoping that we can keep it that way. With that being said he unfortunately though he does not have significant edema of the lower extremities does have cyanosis noted of the first and second toe on the right foot. That has me very concerned as well at this point. His capillary refill was also very poor at this time definitely greater than 3 seconds. His foot was also cool to touch especially in and around this toe. I do believe that he is likely experiencing critical limb ischemia and needs to be evaluated as soon as possible. Integumentary (Hair, Skin) Wound #1 status is Open. Original cause of wound was Gradually Appeared. The wound is located on the Right Toe Great. The wound measures 3cm length x 2.7cm width x 0.1cm depth; 6.362cm^2 area and 0.636cm^3 volume. There is no tunneling or undermining noted. There is a none present amount of drainage noted. The wound margin is distinct with the outline attached to the wound base. There is a large (67-100%) amount of necrotic tissue within the wound bed including Eschar. Wound #2 status is Open. Original cause of wound was Gradually Appeared. The wound is located on the Right Toe Second. The wound measures 0.8cm length x 0.7cm width x 0.1cm depth; 0.44cm^2 area and 0.044cm^3 volume. There is Fat Layer (Subcutaneous Tissue) Exposed exposed. There is no tunneling or undermining noted. There is a none present amount of drainage noted. The wound margin is flat and intact. There is no granulation within the wound bed. There is a large (67-100%) amount of necrotic tissue within the wound bed including Eschar. Assessment Active Problems ICD-10 Type 2 diabetes mellitus with foot ulcer Non-pressure chronic ulcer of other part of right foot with other specified severity Other specified peripheral  vascular diseases Acute embolism and thrombosis of other specified deep vein of right lower extremity Plan Follow-up Appointments: Return Appointment in 1 week. Dressing Change Frequency: Wound #1 Right Toe Great: Change dressing every day. Wound #2 Right Toe Second: Change dressing every day. Wound Cleansing: May shower and wash wound with soap and water. 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 I would recommend at this time that we ASAP try to get the patient in for an appointment with vascular for arterial studies and physician evaluation. This I think is of  utmost importance as I do believe the patient is currently in critical limb ischemia and subsequently this can only it worse I do not think it is good to get better. This is a very urgent request. 2. I am also going to suggest at this point that since the patient does not have any evidence of osteomyelitis that we continue with the doxycycline he has that at this point. Were also going to use silver alginate between the toes to try to keep things nice and dry we will use Betadine to try to keep this dry and well protected from infection as well. The patient and his wife are in agreement with this plan. 3. I did explain that if he starts to develop spreading and worsening gangrene and/or cyanosis which I describe what this was and showed his wife what to look for  he needs to go to the ER ASAP and I would recommend going to [facility] as I think he will need to be seen by the vascular surgeon on-call and that was discussed with the patient today as well. We will see patient back for reevaluation in 1 week here in the clinic. If anything worsens or changes patient will contact our office for additional recommendations. Electronic Signature(s) Signed: 06/22/2019 3:59:42 PM By: Worthy Keeler PA-C Entered By: Worthy Keeler on 06/22/2019 15:59:41 -------------------------------------------------------------------------------- HxROS Details Patient Name: Date of Service: Jonathon [name], Jonathon [name] 06/22/2019 2:45 PM Medical Record [id number] Patient Account Number: [id number] Date of Birth/Sex: Treating RN: [birth year] (69 y.o. Hessie Diener Primary Care Provider: [name] Other Clinician: Referring Provider: Treating Provider/Extender:Stone III, Shelby Dubin, KRISTEN Weeks in Treatment: 0 Information Obtained From Patient Constitutional Symptoms (General Health) Complaints and Symptoms: Negative for: Fatigue; Fever; Chills; Marked Weight Change Eyes Complaints and Symptoms: Positive for: Glasses / Contacts Negative for: Dry Eyes; Vision Changes Medical History: Negative for: Cataracts; Glaucoma; Optic Neuritis Ear/Nose/Mouth/Throat Complaints and Symptoms: Negative for: Chronic sinus problems or rhinitis Medical History: Negative for: Chronic sinus problems/congestion; Middle ear problems Respiratory Complaints and Symptoms: Negative for: Chronic or frequent coughs; Shortness of Breath Medical History: Negative for: Aspiration; Asthma; Chronic Obstructive Pulmonary Disease (COPD); Pneumothorax; Sleep Apnea; Tuberculosis Gastrointestinal Complaints and Symptoms: Negative for: Frequent diarrhea; Nausea; Vomiting Medical History: Negative for: Cirrhosis ; Colitis; Crohns; Hepatitis A; Hepatitis B; Hepatitis C Endocrine Complaints and  Symptoms: Negative for: Heat/cold intolerance Medical History: Positive for: Type II Diabetes Time with diabetes: 69 years old Treated with: Oral agents, Diet Blood sugar tested every day: No Genitourinary Complaints and Symptoms: Negative for: Frequent urination Medical History: Positive for: End Stage Renal Disease - stage III Integumentary (Skin) Complaints and Symptoms: Positive for: Wounds - right foot Medical History: Negative for: History of Burn Musculoskeletal Complaints and Symptoms: Negative for: Muscle Pain; Muscle Weakness Medical History: Positive for: Gout Negative for: Rheumatoid Arthritis; Osteoarthritis; Osteomyelitis Neurologic Complaints and Symptoms: Negative for: Numbness/parasthesias Medical History: Positive for: Neuropathy Negative for: Dementia; Quadriplegia; Paraplegia; Seizure Disorder Psychiatric Complaints and Symptoms: Negative for: Claustrophobia; Suicidal Medical History: Negative for: Anorexia/bulimia; Confinement Anxiety Hematologic/Lymphatic Medical History: Negative for: Anemia; Hemophilia; Jonathon [name] Immunodeficiency Virus; Lymphedema; Sickle Cell Disease Cardiovascular Medical History: Positive for: Deep Vein Thrombosis; Hypertension - per patient no longer on BP medication Negative for: Angina; Arrhythmia; Congestive Heart Failure; Coronary Artery Disease; Hypotension; Myocardial Infarction; Peripheral Arterial Disease; Peripheral Venous Disease; Phlebitis; Vasculitis Immunological Medical History: Negative for: Lupus Erythematosus; Raynauds; Scleroderma Oncologic Medical History: Negative for: Received Chemotherapy; Received Radiation Immunizations Pneumococcal Vaccine: Received Pneumococcal Vaccination: Yes Implantable  Devices None Hospitalization / Surgery History Type of Hospitalization/Surgery DVT and cellulitis- [place] 06/01/2019 10 year ago [place] remove a tooth renal gland remove-15 years ago Family and Social  History Cancer: Yes - Siblings,Mother; Diabetes: No; Heart Disease: No; Hereditary Spherocytosis: No; Hypertension: Yes - Mother,Father; Kidney Disease: No; Lung Disease: Yes - Mother; Seizures: No; Stroke: Yes - Father; Thyroid Problems: No; Tuberculosis: No; Former smoker - quit 37 years ago; Marital Status - Married; Alcohol Use: Daily - 1-2 drinks a night; Drug Use: Current History - marijuana; Caffeine Use: Daily - 1 coffee; Financial Concerns: No; Food, Clothing or Shelter Needs: No; Support System Lacking: No; Transportation Concerns: No Electronic Signature(s) Signed: 06/22/2019 5:55:18 PM By: Worthy Keeler PA-C Signed: 06/23/2019 6:07:18 PM By: Deon Pilling Entered By: Deon Pilling on 06/22/2019 15:15:32 -------------------------------------------------------------------------------- SuperBill Details Patient Name: Date of Service: Jonathon [name], Jonathon [name] 06/22/2019 Medical Record [id number] Patient Account Number: [id number] Date of Birth/Sex: Treating RN: 1950/05/31 (69 y.o. 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 I73.89 Other specified peripheral vascular diseases I82.491 Acute embolism and thrombosis of other specified deep vein of right lower extremity Facility Procedures CPT4 Code: 84784128 Description: 99214 - WOUND CARE VISIT-LEV 4 EST PT Modifier: Quantity: 1 Physician Procedures CPT4 Code Description: 2081388 [id number] - WC PHYS LEVEL 4 - NEW PT ICD-10 Diagnosis Description 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

## 2019-06-23 NOTE — Progress Notes (Signed)
Referring Physician: Dr [facility]  [name] MRN: [id number] DOB: [birth year] Sex: male  REASON FOR CONSULT: Nonhealing wound right foot  HPI: Jonathon [name] is a 69 y.o. male, with a 2-week history of a nonhealing wound of his right foot.  He states he does not know how the foot wound began but he looked down in the shower and he had redness coming up his calf and in his foot and was admitted to the hospital with cellulitis.  He was discharged recently and sent to the wound center.  He was then referred here for further evaluation.  He is currently on doxycycline antibiotic coverage.  He does not describe claudication symptoms.  He does not have rest pain.  However, he has bilateral peripheral neuropathy.  Other medical problems include diabetes, elevated cholesterol, neuropathy, thrombocytopenia (platelet count in the 110s), CKD 3  these are all currently stable.He is a former smoker.  He quit over 40 years ago.  He is on Xarelto and aspirin and a statin.  He is on Xarelto for recent right femoral vein DVT.  This was diagnosed June 08, 2019.  He has a remote history of atrial fibrillation.  He does occasionally have some palpitations.  Past Medical History:  Diagnosis Date  . Anxiety   . Diabetes mellitus without complication ([place]   . Gout   . High cholesterol   . Neuropathy   . Thyroid disease    Past Surgical History:  Procedure Laterality Date  . ADRENALECTOMY      Family History  Problem Relation Age of Onset  . Diabetes Mellitus II Maternal Aunt     SOCIAL HISTORY: Social History   Socioeconomic History  . Marital status: Married    Spouse name: Not on file  . Number of children: Not on file  . Years of education: Not on file  . Highest education level: Not on file  Occupational History  . Not on file  Tobacco Use  . Smoking status: Former Research scientist (life sciences)  . Smokeless tobacco: Never Used  Substance and Sexual Activity  . Alcohol  use: Yes    Comment: daily  . Drug use: Never  . Sexual activity: Not on file  Other Topics Concern  . Not on file  Social History Narrative  . Not on file   Social Determinants of Health   Financial Resource Strain:   . Difficulty of Paying Living Expenses: Not on file  Food Insecurity:   . Worried About Charity fundraiser in the Last Year: Not on file  . Ran Out of Food in the Last Year: Not on file  Transportation Needs:   . Lack of Transportation (Medical): Not on file  . Lack of Transportation (Non-Medical): Not on file  Physical Activity:   . Days of Exercise per Week: Not on file  . Minutes of Exercise per Session: Not on file  Stress:   . Feeling of Stress : Not on file  Social Connections:   . Frequency of Communication with Friends and Family: Not on file  . Frequency of Social Gatherings with Friends and Family: Not on file  . Attends Religious Services: Not on file  . Active Member of Clubs or Organizations: Not on file  . Attends Archivist Meetings: Not on file  . Marital Status: Not on file  Intimate Partner Violence:   . Fear of Current or Ex-Partner: Not on file  . Emotionally Abused: Not on  file  . Physically Abused: Not on file  . Sexually Abused: Not on file    Allergies  Allergen Reactions  . Morphine And Related Itching    Current Outpatient Medications  Medication Sig Dispense Refill  . ALPRAZolam (XANAX) 0.25 MG tablet Take 0.25 mg by mouth 3 (three) times daily as needed.    [name] amLODipine-benazepril (LOTREL) 5-10 MG capsule Take by mouth.    [name] aspirin 81 MG EC tablet 81 mg per 1 tablet, ORAL, Daily, 30 tablet, 0 Refill(s), Activate Med (Rx)    . atorvastatin (LIPITOR) 40 MG tablet Take 40 mg by mouth daily.    . colchicine 0.6 MG tablet Take 0.6 mg by mouth daily.     [name] doxazosin (CARDURA) 4 MG tablet Take 4 mg by mouth daily.     [name] doxycycline (VIBRAMYCIN) 100 MG capsule Take 100 mg by mouth 2 (two) times daily.    . febuxostat  (ULORIC) 40 MG tablet Take 40 mg by mouth daily.    [name] gabapentin (NEURONTIN) 300 MG capsule Take by mouth.    [name] gemfibrozil (LOPID) 600 MG tablet Take 600 mg by mouth 2 (two) times daily before a meal.     . hydrochlorothiazide (HYDRODIURIL) 25 MG tablet TAKE 1 TABLET BY MOUTH EVERY DAY    . ibuprofen (ADVIL) 200 MG tablet Take 400 mg by mouth every 6 (six) hours as needed for moderate pain.    . indomethacin (INDOCIN) 50 MG capsule Take 50 mg by mouth 2 (two) times daily.    [name] JARDIANCE 10 MG TABS tablet Take 10 mg by mouth daily.    [name] levothyroxine (SYNTHROID) 112 MCG tablet Take 112 mcg by mouth daily before breakfast.     . magnesium oxide (MAG-OX) 400 MG tablet Take by mouth.    . metFORMIN (GLUCOPHAGE-XR) 500 MG 24 hr tablet Take 500 mg by mouth 2 (two) times daily with a meal.    . metoprolol tartrate (LOPRESSOR) 50 MG tablet Take by mouth.    . oxybutynin (DITROPAN-XL) 10 MG 24 hr tablet Take 10 mg by mouth at bedtime.     . pantoprazole (PROTONIX) 40 MG tablet Take 40 mg by mouth 2 (two) times daily.     [name] PARoxetine (PAXIL) 40 MG tablet Take 40 mg by mouth daily.     . potassium chloride SA (KLOR-CON) 20 MEQ tablet Take 20 mEq by mouth 3 (three) times daily.     . pregabalin (LYRICA) 75 MG capsule Take 75 mg by mouth 2 (two) times daily.     . Rivaroxaban (XARELTO) 15 MG TABS tablet Take 1 tablet (15 mg total) by mouth 2 (two) times daily. 39 tablet 0  . [START ON 06/30/2019] rivaroxaban (XARELTO) 20 MG TABS tablet Take 1 tablet (20 mg total) by mouth daily. 30 tablet 2  . temazepam (RESTORIL) 15 MG capsule Take 15 mg by mouth at bedtime as needed.    . testosterone cypionate (DEPOTESTOTERONE CYPIONATE) 100 MG/ML injection 100 mg every 14 (fourteen) days.    . clotrimazole (LOTRIMIN) 1 % cream APPLY EXTERNALLY TO THE AFFECTED AREA TWICE DAILY     No current facility-administered medications for this visit.    ROS:   General:  No weight loss, Fever, chills  HEENT: No recent  headaches, no nasal bleeding, no visual changes, no sore throat  Neurologic: No dizziness, blackouts, seizures. No recent symptoms of stroke or mini- stroke. No recent episodes of slurred speech, or temporary blindness.  Cardiac: No recent episodes of chest pain/pressure, no shortness of breath at rest.  No shortness of breath with exertion.  + history of atrial fibrillation or irregular heartbeat  Vascular: No history of rest pain in feet.  No history of claudication.  No history of non-healing ulcer, No history of DVT   Pulmonary: No home oxygen, no productive cough, no hemoptysis,  No asthma or wheezing  Musculoskeletal:  [ ]  Arthritis, [ ]  Low back pain,  [ ]  Joint pain  Hematologic:No history of hypercoagulable state.  No history of easy bleeding.  No history of anemia  Gastrointestinal: No hematochezia or melena,  No gastroesophageal reflux, no trouble swallowing  Urinary: [ ]  X  chronic Kidney disease, [ ]  on HD - [ ]  MWF or [ ]  TTHS, [ ]  Burning with urination, [ ]  Frequent urination, [ ]  Difficulty urinating;   Skin: No rashes  Psychological: No history of anxiety,  No history of depression   Physical Examination  Vitals:   06/23/19 1427  BP: 140/79  Pulse: 77  Resp: 20  Temp: 98 F (36.7 C)  SpO2: 96%  Weight: 252 lb (114.3 kg)  Height: 6' (1.829 m)    Body mass index is 34.18 kg/m.  General:  Alert and oriented, no acute distress HEENT: Normal Neck: No JVD Cardiac: Irregularly irregular Abdomen: Soft, non-tender, non-distended, no mass Skin: No rash, early gangrenous changes medial aspect left first toe rubor/dusky appearance right foot     Extremity Pulses:  2+ radial, brachial, femoral, 2+ left dorsalis pedis, absent right popliteal and DP pulse absent posterior tibial pulses bilaterally Musculoskeletal: No deformity or edema  Neurologic: Upper and lower extremity motor 5/5 and symmetric  DATA:  Patient had bilateral ABIs performed today.  Right was  0.3 left 1.29  ASSESSMENT: Right foot ischemia with early gangrenous changes left first toe difficult to know whether or not this is underlying chronic peripheral arterial disease or potentially embolic phenomenon from what I suspect is atrial fibrillation.   PLAN: Patient will stop his Xarelto today.  Intervention for his right foot is going to be complicated by the fact that he has acute DVT.  Plan will be to admit him to the hospital tomorrow for IV heparin over the weekend with a plan to do an aortogram lower extremity runoff possible intervention by Dr. Donzetta Matters on Monday.  He will also need an echocardiogram and a twelve-lead EKG tomorrow as I suspect he is in atrial fibrillation.   [name], MD [facility] Office: [phone] Pager: [phone]

## 2019-06-23 NOTE — Progress Notes (Signed)
RAKIN, LEMELLE ([id number]) Visit Report for 06/22/2019 Abuse/Suicide Risk Screen Details Patient Name: Date of Service: Jonathon [name], Jonathon [name] 06/22/2019 2:45 PM Medical Record [id number] Patient Account Number: [id number] Date of Birth/Sex: Treating RN: [birth year] (69 y.o. Hessie Diener Primary Care Makenna Macaluso: [name] Other Clinician: Referring Treylan Mcclintock: Treating Marlyce Mcdougald/Extender:Stone III, Shelby Dubin, KRISTEN Weeks in Treatment: 0 Abuse/Suicide Risk Screen Items Answer ABUSE RISK SCREEN: Has anyone close to you tried to hurt or harm you recentlyo No Do you feel uncomfortable with anyone in your familyo No Has anyone forced you do things that you didnt want to doo No Electronic Signature(s) Signed: 06/23/2019 6:07:18 PM By: Deon Pilling Entered By: Deon Pilling on 06/22/2019 14:44:59 -------------------------------------------------------------------------------- Activities of Daily Living Details Patient Name: Date of Service: Jonathon [name], Jonathon [name] 06/22/2019 2:45 PM Medical Record [id number] Patient Account Number: [id number] Date of Birth/Sex: Treating RN: May 02, 1950 (69 y.o. Hessie Diener Primary Care Lonzo Saulter: [name] Other Clinician: Referring Kirtan Sada: Treating Rika Daughdrill/Extender:Stone III, Shelby Dubin, KRISTEN Weeks in Treatment: 0 Activities of Daily Living Items Answer Activities of Daily Living (Please select one for each item) Drive Automobile Not Able Take Medications Completely Able Use Telephone Completely Able Care for Appearance Completely Able Use Toilet Completely Able Bath / Shower Completely Able Dress Self Completely Able Feed Self Completely Able Walk Need Assistance Get In / Out Bed Completely Able Housework Completely Able Prepare Meals Completely [place] for Self Completely Able Electronic Signature(s) Signed: 06/23/2019 6:07:18 PM By: Deon Pilling Entered By: Deon Pilling on  06/22/2019 14:46:33 -------------------------------------------------------------------------------- Education Screening Details Patient Name: Date of Service: Jonathon [name], Jonathon [name] 06/22/2019 2:45 PM Medical Record [id number] Patient Account Number: [id number] Date of Birth/Sex: Treating RN: 12-Feb-1951 (68 y.o. Hessie Diener Primary Care Lujuana Kapler: [name] Other Clinician: Referring Bilan Tedesco: Treating Aliviah Spain/Extender:Stone III, Shelby Dubin, KRISTEN Weeks in Treatment: 0 Primary Learner Assessed: Patient Learning Preferences/Education Level/Primary Language Learning Preference: Explanation, Demonstration, Printed Material Highest Education Level: College or Above Preferred Language: English Cognitive Barrier Language Barrier: No Translator Needed: No Memory Deficit: No Emotional Barrier: No Cultural/Religious Beliefs Affecting Medical Care: No Physical Barrier Impaired Vision: Yes Glasses Impaired Hearing: Yes Hearing Aid Decreased Hand dexterity: No Knowledge/Comprehension Knowledge Level: High Comprehension Level: High Ability to understand written High instructions: Ability to understand verbal High instructions: Motivation Anxiety Level: Calm Cooperation: Cooperative Education Importance: Acknowledges Need Interest in Health Problems: Asks Questions Perception: Coherent Willingness to Engage in Self- High Management Activities: Readiness to Engage in Self- High Management Activities: Electronic Signature(s) Signed: 06/23/2019 6:07:18 PM By: Deon Pilling Entered By: Deon Pilling on 06/22/2019 14:46:02 -------------------------------------------------------------------------------- Fall Risk Assessment Details Patient Name: Date of Service: Jonathon [name], Jonathon [name] 06/22/2019 2:45 PM Medical Record [id number] Patient Account Number: [id number] Date of Birth/Sex: Treating RN: 03-21-1951 (69 y.o. Hessie Diener Primary Care Hargis Vandyne:  [name] Other Clinician: Referring Albertia Carvin: Treating Brier Firebaugh/Extender:Stone III, Shelby Dubin, KRISTEN Weeks in Treatment: 0 Fall Risk Assessment Items Have you had 2 or more falls in the last 12 monthso 0 No Have you had any fall that resulted in injury in the last 12 monthso 0 No FALLS RISK SCREEN History of falling - immediate or within 3 months 0 No Secondary diagnosis (Do you have 2 or more medical diagnoseso) 0 No Ambulatory aid None/bed rest/wheelchair/nurse 0 No Crutches/cane/walker 15 Yes Furniture 0 No Intravenous therapy Access/Saline/Heparin Lock 0 No Weak (short steps with or without shuffle, stooped but able to lift head 0 No while walking, may seek support from furniture) Impaired (short steps with shuffle,  may have difficulty arising from chair, 0 No head down, impaired balance) Mental Status Oriented to own ability 0 Yes Overestimates or forgets limitations 0 No Risk Level: Low Risk Score: 15 Electronic Signature(s) Signed: 06/23/2019 6:07:18 PM By: Deon Pilling Entered By: Deon Pilling on 06/22/2019 14:46:50 -------------------------------------------------------------------------------- Foot Assessment Details Patient Name: Date of Service: Jonathon [name], Jonathon [name] 06/22/2019 2:45 PM Medical Record [id number] Patient Account Number: [id number] Date of Birth/Sex: Treating RN: [birth year] (69 y.o. Hessie Diener Primary Care Janae Bonser: [name] Other Clinician: Referring Layson Bertsch: Treating Nasim Garofano/Extender:Stone III, Shelby Dubin, KRISTEN Weeks in Treatment: 0 Foot Assessment Items Site Locations + = Sensation present, - = Sensation absent, C = Callus, U = Ulcer R = Redness, W = Warmth, M = Maceration, PU = Pre-ulcerative lesion F = Fissure, S = Swelling, D = Dryness Assessment Right: Left: Other Deformity: No No Prior Foot Ulcer: No No Prior Amputation: No No Charcot Joint: No No Ambulatory Status: Ambulatory With Help Assistance  Device: Cane Gait: Steady Electronic Signature(s) Signed: 06/23/2019 6:07:18 PM By: Deon Pilling Entered By: Deon Pilling on 06/22/2019 14:59:52 -------------------------------------------------------------------------------- Nutrition Risk Screening Details Patient Name: Date of Service: Jonathon [name], Jonathon [name] 06/22/2019 2:45 PM Medical Record [id number] Patient Account Number: [id number] Date of Birth/Sex: Treating RN: 08/17/1950 (69 y.o. Hessie Diener Primary Care Prabhnoor Ellenberger: [name] Other Clinician: Referring Amalia Edgecombe: Treating Shyenne Maggard/Extender:Stone III, Shelby Dubin, KRISTEN Weeks in Treatment: 0 Height (in): 72 Weight (lbs): 245 Body Mass Index (BMI): 33.2 Nutrition Risk Screening Items Score Screening NUTRITION RISK SCREEN: I have an illness or condition that made me change the kind and/or 2 Yes amount of food I eat I eat fewer than two meals per day 0 No I eat few fruits and vegetables, or milk products 0 No I have three or more drinks of beer, liquor or wine almost every day 0 No I have tooth or mouth problems that make it hard for me to eat 0 No I don't always have enough money to buy the food I need 0 No I eat alone most of the time 0 No I take three or more different prescribed or over-the-counter drugs a day 1 Yes 0 No Without wanting to, I have lost or gained 10 pounds in the last six months I am not always physically able to shop, cook and/or feed myself 2 Yes Nutrition Protocols Good Risk Protocol Provide education on elevated blood sugars and Moderate Risk Protocol 0 impact on wound healing, as applicable High Risk Proctocol Risk Level: Moderate Risk Score: 5 Electronic Signature(s) Signed: 06/23/2019 6:07:18 PM By: Deon Pilling Entered By: Deon Pilling on 06/22/2019 14:47:00

## 2019-06-23 NOTE — H&P (View-Only) (Signed)
Referring Physician: Dr [facility]  [name] MRN: [id number] DOB: [birth year] Sex: male  REASON FOR CONSULT: Nonhealing wound right foot  HPI: Jonathon [name] is a 69 y.o. male, with a 2-week history of a nonhealing wound of his right foot.  He states he does not know how the foot wound began but he looked down in the shower and he had redness coming up his calf and in his foot and was admitted to the hospital with cellulitis.  He was discharged recently and sent to the wound center.  He was then referred here for further evaluation.  He is currently on doxycycline antibiotic coverage.  He does not describe claudication symptoms.  He does not have rest pain.  However, he has bilateral peripheral neuropathy.  Other medical problems include diabetes, elevated cholesterol, neuropathy, thrombocytopenia (platelet count in the 110s), CKD 3  these are all currently stable.He is a former smoker.  He quit over 40 years ago.  He is on Xarelto and aspirin and a statin.  He is on Xarelto for recent right femoral vein DVT.  This was diagnosed June 08, 2019.  He has a remote history of atrial fibrillation.  He does occasionally have some palpitations.  Past Medical History:  Diagnosis Date  . Anxiety   . Diabetes mellitus without complication ([place])   . Gout   . High cholesterol   . Neuropathy   . Thyroid disease    Past Surgical History:  Procedure Laterality Date  . ADRENALECTOMY      Family History  Problem Relation Age of Onset  . Diabetes Mellitus II Maternal Aunt     SOCIAL HISTORY: Social History   Socioeconomic History  . Marital status: Married    Spouse name: Not on file  . Number of children: Not on file  . Years of education: Not on file  . Highest education level: Not on file  Occupational History  . Not on file  Tobacco Use  . Smoking status: Former Research scientist (life sciences)  . Smokeless tobacco: Never Used  Substance and Sexual Activity  . Alcohol  use: Yes    Comment: daily  . Drug use: Never  . Sexual activity: Not on file  Other Topics Concern  . Not on file  Social History Narrative  . Not on file   Social Determinants of Health   Financial Resource Strain:   . Difficulty of Paying Living Expenses: Not on file  Food Insecurity:   . Worried About Charity fundraiser in the Last Year: Not on file  . Ran Out of Food in the Last Year: Not on file  Transportation Needs:   . Lack of Transportation (Medical): Not on file  . Lack of Transportation (Non-Medical): Not on file  Physical Activity:   . Days of Exercise per Week: Not on file  . Minutes of Exercise per Session: Not on file  Stress:   . Feeling of Stress : Not on file  Social Connections:   . Frequency of Communication with Friends and Family: Not on file  . Frequency of Social Gatherings with Friends and Family: Not on file  . Attends Religious Services: Not on file  . Active Member of Clubs or Organizations: Not on file  . Attends Archivist Meetings: Not on file  . Marital Status: Not on file  Intimate Partner Violence:   . Fear of Current or Ex-Partner: Not on file  . Emotionally Abused: Not on  file  . Physically Abused: Not on file  . Sexually Abused: Not on file    Allergies  Allergen Reactions  . Morphine And Related Itching    Current Outpatient Medications  Medication Sig Dispense Refill  . ALPRAZolam (XANAX) 0.25 MG tablet Take 0.25 mg by mouth 3 (three) times daily as needed.    [name] amLODipine-benazepril (LOTREL) 5-10 MG capsule Take by mouth.    [name] aspirin 81 MG EC tablet 81 mg per 1 tablet, ORAL, Daily, 30 tablet, 0 Refill(s), Activate Med (Rx)    . atorvastatin (LIPITOR) 40 MG tablet Take 40 mg by mouth daily.    . colchicine 0.6 MG tablet Take 0.6 mg by mouth daily.     [name] doxazosin (CARDURA) 4 MG tablet Take 4 mg by mouth daily.     [name] doxycycline (VIBRAMYCIN) 100 MG capsule Take 100 mg by mouth 2 (two) times daily.    . febuxostat  (ULORIC) 40 MG tablet Take 40 mg by mouth daily.    [name] gabapentin (NEURONTIN) 300 MG capsule Take by mouth.    [name] gemfibrozil (LOPID) 600 MG tablet Take 600 mg by mouth 2 (two) times daily before a meal.     . hydrochlorothiazide (HYDRODIURIL) 25 MG tablet TAKE 1 TABLET BY MOUTH EVERY DAY    . ibuprofen (ADVIL) 200 MG tablet Take 400 mg by mouth every 6 (six) hours as needed for moderate pain.    . indomethacin (INDOCIN) 50 MG capsule Take 50 mg by mouth 2 (two) times daily.    [name] JARDIANCE 10 MG TABS tablet Take 10 mg by mouth daily.    [name] levothyroxine (SYNTHROID) 112 MCG tablet Take 112 mcg by mouth daily before breakfast.     . magnesium oxide (MAG-OX) 400 MG tablet Take by mouth.    . metFORMIN (GLUCOPHAGE-XR) 500 MG 24 hr tablet Take 500 mg by mouth 2 (two) times daily with a meal.    . metoprolol tartrate (LOPRESSOR) 50 MG tablet Take by mouth.    . oxybutynin (DITROPAN-XL) 10 MG 24 hr tablet Take 10 mg by mouth at bedtime.     . pantoprazole (PROTONIX) 40 MG tablet Take 40 mg by mouth 2 (two) times daily.     [name] PARoxetine (PAXIL) 40 MG tablet Take 40 mg by mouth daily.     . potassium chloride SA (KLOR-CON) 20 MEQ tablet Take 20 mEq by mouth 3 (three) times daily.     . pregabalin (LYRICA) 75 MG capsule Take 75 mg by mouth 2 (two) times daily.     . Rivaroxaban (XARELTO) 15 MG TABS tablet Take 1 tablet (15 mg total) by mouth 2 (two) times daily. 39 tablet 0  . [START ON 06/30/2019] rivaroxaban (XARELTO) 20 MG TABS tablet Take 1 tablet (20 mg total) by mouth daily. 30 tablet 2  . temazepam (RESTORIL) 15 MG capsule Take 15 mg by mouth at bedtime as needed.    . testosterone cypionate (DEPOTESTOTERONE CYPIONATE) 100 MG/ML injection 100 mg every 14 (fourteen) days.    . clotrimazole (LOTRIMIN) 1 % cream APPLY EXTERNALLY TO THE AFFECTED AREA TWICE DAILY     No current facility-administered medications for this visit.    ROS:   General:  No weight loss, Fever, chills  HEENT: No recent  headaches, no nasal bleeding, no visual changes, no sore throat  Neurologic: No dizziness, blackouts, seizures. No recent symptoms of stroke or mini- stroke. No recent episodes of slurred speech, or temporary blindness.  Cardiac: No recent episodes of chest pain/pressure, no shortness of breath at rest.  No shortness of breath with exertion.  + history of atrial fibrillation or irregular heartbeat  Vascular: No history of rest pain in feet.  No history of claudication.  No history of non-healing ulcer, No history of DVT   Pulmonary: No home oxygen, no productive cough, no hemoptysis,  No asthma or wheezing  Musculoskeletal:  [ ]  Arthritis, [ ]  Low back pain,  [ ]  Joint pain  Hematologic:No history of hypercoagulable state.  No history of easy bleeding.  No history of anemia  Gastrointestinal: No hematochezia or melena,  No gastroesophageal reflux, no trouble swallowing  Urinary: [ ]  X  chronic Kidney disease, [ ]  on HD - [ ]  MWF or [ ]  TTHS, [ ]  Burning with urination, [ ]  Frequent urination, [ ]  Difficulty urinating;   Skin: No rashes  Psychological: No history of anxiety,  No history of depression   Physical Examination  Vitals:   06/23/19 1427  BP: 140/79  Pulse: 77  Resp: 20  Temp: 98 F (36.7 C)  SpO2: 96%  Weight: 252 lb (114.3 kg)  Height: 6' (1.829 m)    Body mass index is 34.18 kg/m.  General:  Alert and oriented, no acute distress HEENT: Normal Neck: No JVD Cardiac: Irregularly irregular Abdomen: Soft, non-tender, non-distended, no mass Skin: No rash, early gangrenous changes medial aspect left first toe rubor/dusky appearance right foot     Extremity Pulses:  2+ radial, brachial, femoral, 2+ left dorsalis pedis, absent right popliteal and DP pulse absent posterior tibial pulses bilaterally Musculoskeletal: No deformity or edema  Neurologic: Upper and lower extremity motor 5/5 and symmetric  DATA:  Patient had bilateral ABIs performed today.  Right was  0.3 left 1.29  ASSESSMENT: Right foot ischemia with early gangrenous changes left first toe difficult to know whether or not this is underlying chronic peripheral arterial disease or potentially embolic phenomenon from what I suspect is atrial fibrillation.   PLAN: Patient will stop his Xarelto today.  Intervention for his right foot is going to be complicated by the fact that he has acute DVT.  Plan will be to admit him to the hospital tomorrow for IV heparin over the weekend with a plan to do an aortogram lower extremity runoff possible intervention by Dr. Donzetta Matters on Monday.  He will also need an echocardiogram and a twelve-lead EKG tomorrow as I suspect he is in atrial fibrillation.   [name], MD [facility] Office: [phone] Pager: [phone]

## 2019-06-24 ENCOUNTER — Inpatient Hospital Stay (HOSPITAL_COMMUNITY)
Admission: AD | Admit: 2019-06-24 | Discharge: 2019-07-01 | DRG: 253 | Disposition: A | Discharge status: Home / Self Care | Payer: Medicare Other | Attending: Vascular Surgery | Admitting: Vascular Surgery

## 2019-06-24 DIAGNOSIS — E669 Obesity, unspecified: Secondary | ICD-10-CM | POA: Diagnosis present

## 2019-06-24 DIAGNOSIS — I4891 Unspecified atrial fibrillation: Secondary | ICD-10-CM | POA: Diagnosis not present

## 2019-06-24 DIAGNOSIS — N183 Chronic kidney disease, stage 3 unspecified: Secondary | ICD-10-CM | POA: Diagnosis present

## 2019-06-24 DIAGNOSIS — Z7901 Long term (current) use of anticoagulants: Secondary | ICD-10-CM

## 2019-06-24 DIAGNOSIS — F419 Anxiety disorder, unspecified: Secondary | ICD-10-CM | POA: Diagnosis present

## 2019-06-24 DIAGNOSIS — Z833 Family history of diabetes mellitus: Secondary | ICD-10-CM

## 2019-06-24 DIAGNOSIS — M109 Gout, unspecified: Secondary | ICD-10-CM | POA: Diagnosis present

## 2019-06-24 DIAGNOSIS — I739 Peripheral vascular disease, unspecified: Secondary | ICD-10-CM | POA: Diagnosis present

## 2019-06-24 DIAGNOSIS — Z20822 Contact with and (suspected) exposure to covid-19: Secondary | ICD-10-CM | POA: Diagnosis not present

## 2019-06-24 DIAGNOSIS — Z7984 Long term (current) use of oral hypoglycemic drugs: Secondary | ICD-10-CM | POA: Diagnosis not present

## 2019-06-24 DIAGNOSIS — E1122 Type 2 diabetes mellitus with diabetic chronic kidney disease: Secondary | ICD-10-CM | POA: Diagnosis not present

## 2019-06-24 DIAGNOSIS — D696 Thrombocytopenia, unspecified: Secondary | ICD-10-CM | POA: Diagnosis not present

## 2019-06-24 DIAGNOSIS — E039 Hypothyroidism, unspecified: Secondary | ICD-10-CM | POA: Diagnosis not present

## 2019-06-24 DIAGNOSIS — Z86718 Personal history of other venous thrombosis and embolism: Secondary | ICD-10-CM

## 2019-06-24 DIAGNOSIS — Z79899 Other long term (current) drug therapy: Secondary | ICD-10-CM

## 2019-06-24 DIAGNOSIS — Z6832 Body mass index (BMI) 32.0-32.9, adult: Secondary | ICD-10-CM | POA: Diagnosis not present

## 2019-06-24 DIAGNOSIS — E1152 Type 2 diabetes mellitus with diabetic peripheral angiopathy with gangrene: Secondary | ICD-10-CM | POA: Diagnosis not present

## 2019-06-24 DIAGNOSIS — E785 Hyperlipidemia, unspecified: Secondary | ICD-10-CM | POA: Diagnosis present

## 2019-06-24 DIAGNOSIS — Z792 Long term (current) use of antibiotics: Secondary | ICD-10-CM

## 2019-06-24 DIAGNOSIS — Z7982 Long term (current) use of aspirin: Secondary | ICD-10-CM | POA: Diagnosis not present

## 2019-06-24 DIAGNOSIS — M62262 Nontraumatic ischemic infarction of muscle, left lower leg: Secondary | ICD-10-CM | POA: Diagnosis not present

## 2019-06-24 DIAGNOSIS — E1142 Type 2 diabetes mellitus with diabetic polyneuropathy: Secondary | ICD-10-CM | POA: Diagnosis present

## 2019-06-24 DIAGNOSIS — Z9889 Other specified postprocedural states: Secondary | ICD-10-CM | POA: Diagnosis not present

## 2019-06-24 DIAGNOSIS — Z87891 Personal history of nicotine dependence: Secondary | ICD-10-CM | POA: Diagnosis not present

## 2019-06-24 DIAGNOSIS — I743 Embolism and thrombosis of arteries of the lower extremities: Secondary | ICD-10-CM | POA: Diagnosis not present

## 2019-06-24 DIAGNOSIS — I428 Other cardiomyopathies: Secondary | ICD-10-CM | POA: Diagnosis present

## 2019-06-24 DIAGNOSIS — I4819 Other persistent atrial fibrillation: Secondary | ICD-10-CM | POA: Diagnosis not present

## 2019-06-24 DIAGNOSIS — I82401 Acute embolism and thrombosis of unspecified deep veins of right lower extremity: Secondary | ICD-10-CM | POA: Diagnosis not present

## 2019-06-24 DIAGNOSIS — I998 Other disorder of circulatory system: Secondary | ICD-10-CM | POA: Diagnosis not present

## 2019-06-24 DIAGNOSIS — I70261 Atherosclerosis of native arteries of extremities with gangrene, right leg: Secondary | ICD-10-CM | POA: Diagnosis not present

## 2019-06-24 DIAGNOSIS — I129 Hypertensive chronic kidney disease with stage 1 through stage 4 chronic kidney disease, or unspecified chronic kidney disease: Secondary | ICD-10-CM | POA: Diagnosis present

## 2019-06-24 DIAGNOSIS — Z79890 Hormone replacement therapy (postmenopausal): Secondary | ICD-10-CM

## 2019-06-24 HISTORY — DX: Hypothyroidism, unspecified: E03.9

## 2019-06-24 LAB — COMPREHENSIVE METABOLIC PANEL
ALT: 16 U/L (ref 0–44)
AST: 18 U/L (ref 15–41)
Albumin: 3.1 g/dL — ABNORMAL LOW (ref 3.5–5.0)
Alkaline Phosphatase: 51 U/L (ref 38–126)
Anion gap: 15 (ref 5–15)
BUN: 19 mg/dL (ref 8–23)
CO2: 22 mmol/L (ref 22–32)
Calcium: 9 mg/dL (ref 8.9–10.3)
Chloride: 102 mmol/L (ref 98–111)
Creatinine, Ser: 1.47 mg/dL — ABNORMAL HIGH (ref 0.61–1.24)
GFR calc Af Amer: 56 mL/min — ABNORMAL LOW (ref >60)
GFR calc non Af Amer: 48 mL/min — ABNORMAL LOW (ref >60)
Glucose, Bld: 128 mg/dL — ABNORMAL HIGH (ref 70–99)
Potassium: 2.9 mmol/L — ABNORMAL LOW (ref 3.5–5.1)
Sodium: 139 mmol/L (ref 135–145)
Total Bilirubin: 0.9 mg/dL (ref 0.3–1.2)
Total Protein: 6.6 g/dL (ref 6.5–8.1)

## 2019-06-24 LAB — HEMOGLOBIN A1C
Hgb A1c MFr Bld: 6.4 % — ABNORMAL HIGH (ref 4.8–5.6)
Mean Plasma Glucose: 136.98 mg/dL

## 2019-06-24 LAB — CBC
HCT: 51.3 % (ref 39.0–52.0)
Hemoglobin: 17.7 g/dL — ABNORMAL HIGH (ref 13.0–17.0)
MCH: 32.4 pg (ref 26.0–34.0)
MCHC: 34.5 g/dL (ref 30.0–36.0)
MCV: 94 fL (ref 80.0–100.0)
Platelets: 165 10*3/uL (ref 150–400)
RBC: 5.46 MIL/uL (ref 4.22–5.81)
RDW: 12.6 % (ref 11.5–15.5)
WBC: 6.8 10*3/uL (ref 4.0–10.5)
nRBC: 0 % (ref 0.0–0.2)

## 2019-06-24 LAB — HEPARIN LEVEL (UNFRACTIONATED): Heparin Unfractionated: 1.01 IU/mL — ABNORMAL HIGH (ref 0.30–0.70)

## 2019-06-24 LAB — GLUCOSE, CAPILLARY: Glucose-Capillary: 125 mg/dL — ABNORMAL HIGH (ref 70–99)

## 2019-06-24 MED ORDER — INSULIN ASPART 100 UNIT/ML ~~LOC~~ SOLN
4.0000 [IU] | Freq: Three times a day (TID) | SUBCUTANEOUS | Status: DC
  Administered 2019-06-28: 17:00:00 4 [IU] via SUBCUTANEOUS

## 2019-06-24 MED ORDER — IBUPROFEN 400 MG PO TABS
800.0000 mg | ORAL_TABLET | Freq: Three times a day (TID) | ORAL | Status: DC | PRN
  Administered 2019-06-24 – 2019-06-26 (×2): 800 mg via ORAL
  Filled 2019-06-24 (×2): qty 2

## 2019-06-24 MED ORDER — HYDRALAZINE HCL 20 MG/ML IJ SOLN: 5.0000 mg | INTRAMUSCULAR | Status: DC | PRN

## 2019-06-24 MED ORDER — ALPRAZOLAM 0.25 MG PO TABS
0.2500 mg | ORAL_TABLET | Freq: Every day | ORAL | Status: DC
  Administered 2019-06-24 – 2019-06-30 (×7): 0.25 mg via ORAL
  Filled 2019-06-24 (×7): qty 1

## 2019-06-24 MED ORDER — PANTOPRAZOLE SODIUM 40 MG PO TBEC
40.0000 mg | DELAYED_RELEASE_TABLET | Freq: Two times a day (BID) | ORAL | Status: DC
  Administered 2019-06-24 – 2019-07-01 (×14): 40 mg via ORAL
  Filled 2019-06-24 (×14): qty 1

## 2019-06-24 MED ORDER — TEMAZEPAM 15 MG PO CAPS
15.0000 mg | ORAL_CAPSULE | Freq: Every day | ORAL | Status: DC
  Administered 2019-06-25 – 2019-06-30 (×6): 15 mg via ORAL
  Filled 2019-06-24 (×6): qty 1

## 2019-06-24 MED ORDER — TESTOSTERONE CYPIONATE 100 MG/ML IM SOLN: 200.0000 mg | INTRAMUSCULAR | Status: DC

## 2019-06-24 MED ORDER — METOPROLOL TARTRATE 5 MG/5ML IV SOLN
2.0000 mg | INTRAVENOUS | Status: DC | PRN
  Administered 2019-06-25: 5 mg via INTRAVENOUS
  Filled 2019-06-24: qty 5

## 2019-06-24 MED ORDER — ATORVASTATIN CALCIUM 40 MG PO TABS
40.0000 mg | ORAL_TABLET | Freq: Every day | ORAL | Status: DC
  Administered 2019-06-24 – 2019-06-30 (×7): 40 mg via ORAL
  Filled 2019-06-24 (×7): qty 1

## 2019-06-24 MED ORDER — LEVOTHYROXINE SODIUM 112 MCG PO TABS
112.0000 ug | ORAL_TABLET | Freq: Every day | ORAL | Status: DC
  Administered 2019-06-24 – 2019-06-30 (×7): 112 ug via ORAL
  Filled 2019-06-24 (×7): qty 1

## 2019-06-24 MED ORDER — HYDROMORPHONE HCL 1 MG/ML IJ SOLN: 0.5000 mg | INTRAMUSCULAR | Status: DC | PRN

## 2019-06-24 MED ORDER — PREGABALIN 75 MG PO CAPS
150.0000 mg | ORAL_CAPSULE | Freq: Every day | ORAL | Status: DC
  Administered 2019-06-24 – 2019-06-30 (×7): 150 mg via ORAL
  Filled 2019-06-24 (×7): qty 2

## 2019-06-24 MED ORDER — POTASSIUM CHLORIDE CRYS ER 20 MEQ PO TBCR
20.0000 meq | EXTENDED_RELEASE_TABLET | Freq: Once | ORAL | Status: AC
  Administered 2019-06-25: 40 meq via ORAL
  Filled 2019-06-24: qty 2

## 2019-06-24 MED ORDER — DOXAZOSIN MESYLATE 2 MG PO TABS
4.0000 mg | ORAL_TABLET | Freq: Every day | ORAL | Status: DC
  Administered 2019-06-24 – 2019-06-29 (×6): 4 mg via ORAL
  Filled 2019-06-24 (×7): qty 2

## 2019-06-24 MED ORDER — PAROXETINE HCL 20 MG PO TABS
40.0000 mg | ORAL_TABLET | Freq: Every day | ORAL | Status: DC
  Administered 2019-06-24 – 2019-06-30 (×7): 40 mg via ORAL
  Filled 2019-06-24 (×7): qty 2

## 2019-06-24 MED ORDER — FEBUXOSTAT 40 MG PO TABS
40.0000 mg | ORAL_TABLET | Freq: Every day | ORAL | Status: DC
  Administered 2019-06-24 – 2019-06-30 (×7): 40 mg via ORAL
  Filled 2019-06-24 (×7): qty 1

## 2019-06-24 MED ORDER — LABETALOL HCL 5 MG/ML IV SOLN: 10.0000 mg | INTRAVENOUS | Status: DC | PRN

## 2019-06-24 MED ORDER — GUAIFENESIN-DM 100-10 MG/5ML PO SYRP: 15.0000 mL | ORAL_SOLUTION | ORAL | Status: DC | PRN

## 2019-06-24 MED ORDER — GEMFIBROZIL 600 MG PO TABS
600.0000 mg | ORAL_TABLET | Freq: Two times a day (BID) | ORAL | Status: DC
  Administered 2019-06-25 – 2019-07-01 (×9): 600 mg via ORAL
  Filled 2019-06-24 (×13): qty 1

## 2019-06-24 MED ORDER — SODIUM CHLORIDE 0.9 % IV SOLN: INTRAVENOUS | Status: DC

## 2019-06-24 MED ORDER — PANTOPRAZOLE SODIUM 40 MG PO TBEC: 40.0000 mg | DELAYED_RELEASE_TABLET | Freq: Every day | ORAL | Status: DC

## 2019-06-24 MED ORDER — PHENOL 1.4 % MT LIQD: 1.0000 | OROMUCOSAL | Status: DC | PRN

## 2019-06-24 MED ORDER — INSULIN ASPART 100 UNIT/ML ~~LOC~~ SOLN: 0.0000 [IU] | Freq: Every day | SUBCUTANEOUS | Status: DC

## 2019-06-24 MED ORDER — OXYCODONE HCL 5 MG PO TABS
5.0000 mg | ORAL_TABLET | ORAL | Status: DC | PRN
  Administered 2019-06-29 – 2019-06-30 (×2): 10 mg via ORAL
  Filled 2019-06-24 (×2): qty 2

## 2019-06-24 MED ORDER — INSULIN ASPART 100 UNIT/ML ~~LOC~~ SOLN
0.0000 [IU] | Freq: Three times a day (TID) | SUBCUTANEOUS | Status: DC
  Administered 2019-06-25: 2 [IU] via SUBCUTANEOUS
  Administered 2019-06-28: 3 [IU] via SUBCUTANEOUS
  Administered 2019-06-28: 2 [IU] via SUBCUTANEOUS

## 2019-06-24 MED ORDER — DOXYCYCLINE HYCLATE 100 MG PO TABS
100.0000 mg | ORAL_TABLET | Freq: Two times a day (BID) | ORAL | Status: DC
  Administered 2019-06-25 – 2019-07-01 (×13): 100 mg via ORAL
  Filled 2019-06-24 (×13): qty 1

## 2019-06-24 MED ORDER — COLCHICINE 0.6 MG PO TABS
0.6000 mg | ORAL_TABLET | Freq: Every day | ORAL | Status: DC
  Administered 2019-06-24 – 2019-06-30 (×7): 0.6 mg via ORAL
  Filled 2019-06-24 (×7): qty 1

## 2019-06-24 MED ORDER — ONDANSETRON HCL 4 MG/2ML IJ SOLN: 4.0000 mg | Freq: Four times a day (QID) | INTRAMUSCULAR | Status: DC | PRN

## 2019-06-24 MED ORDER — OXYBUTYNIN CHLORIDE ER 10 MG PO TB24
10.0000 mg | ORAL_TABLET | Freq: Every day | ORAL | Status: DC
  Administered 2019-06-24 – 2019-06-30 (×7): 10 mg via ORAL
  Filled 2019-06-24 (×7): qty 1

## 2019-06-24 MED ORDER — HEPARIN (PORCINE) 25000 UT/250ML-% IV SOLN
1600.0000 [IU]/h | INTRAVENOUS | Status: DC
  Administered 2019-06-24: 1700 [IU]/h via INTRAVENOUS
  Administered 2019-06-25: 1600 [IU]/h via INTRAVENOUS
  Filled 2019-06-24 (×2): qty 250

## 2019-06-24 MED ORDER — ALUM & MAG HYDROXIDE-SIMETH 200-200-20 MG/5ML PO SUSP: 15.0000 mL | ORAL | Status: DC | PRN

## 2019-06-24 NOTE — H&P (Signed)
HPI: Jonathon [name] is a 69 y.o. male, with a 2-week history of a nonhealing wound of his right foot. He states he does not know how the foot wound began but he looked down in the shower and he had redness coming up his calf and in his foot and was admitted to the hospital with cellulitis. He was discharged recently and sent to the wound center. He was then referred here for further evaluation. He is currently on doxycycline antibiotic coverage. He does not describe claudication symptoms. He does not have rest pain. However, he has bilateral peripheral neuropathy. Other medical problems include diabetes, elevated cholesterol, neuropathy, thrombocytopenia (platelet count in the 110s), CKD 3 these are all currently stable.He is a former smoker. He quit over 40 years ago. He is on Xarelto and aspirin and a statin. He is on Xarelto for recent right femoral vein DVT. This was diagnosed June 08, 2019. He has a remote history of atrial fibrillation. He does occasionally have some palpitations.       Past Medical History:  Diagnosis Date  . Anxiety   . Diabetes mellitus without complication ([place])   . Gout   . High cholesterol   . Neuropathy   . Thyroid disease         Past Surgical History:  Procedure Laterality Date  . ADRENALECTOMY          Family History  Problem Relation Age of Onset  . Diabetes Mellitus II Maternal Aunt    SOCIAL HISTORY:  Social History        Socioeconomic History  . Marital status: Married    Spouse name: Not on file  . Number of children: Not on file  . Years of education: Not on file  . Highest education level: Not on file  Occupational History  . Not on file  Tobacco Use  . Smoking status: Former Research scientist (life sciences)  . Smokeless tobacco: Never Used  Substance and Sexual Activity  . Alcohol use: Yes    Comment: daily  . Drug use: Never  . Sexual activity: Not on file  Other Topics Concern  . Not on file  Social History Narrative  . Not on file    Social Determinants of Health      Financial Resource Strain:   . Difficulty of Paying Living Expenses: Not on file  Food Insecurity:   . Worried About Charity fundraiser in the Last Year: Not on file  . Ran Out of Food in the Last Year: Not on file  Transportation Needs:   . Lack of Transportation (Medical): Not on file  . Lack of Transportation (Non-Medical): Not on file  Physical Activity:   . Days of Exercise per Week: Not on file  . Minutes of Exercise per Session: Not on file  Stress:   . Feeling of Stress : Not on file  Social Connections:   . Frequency of Communication with Friends and Family: Not on file  . Frequency of Social Gatherings with Friends and Family: Not on file  . Attends Religious Services: Not on file  . Active Member of Clubs or Organizations: Not on file  . Attends Archivist Meetings: Not on file  . Marital Status: Not on file  Intimate Partner Violence:   . Fear of Current or Ex-Partner: Not on file  . Emotionally Abused: Not on file  . Physically Abused: Not on file  . Sexually Abused: Not on file  Allergies  Allergen Reactions  . Morphine And Related Itching         Current Outpatient Medications  Medication Sig Dispense Refill  . ALPRAZolam (XANAX) 0.25 MG tablet Take 0.25 mg by mouth 3 (three) times daily as needed.    [name] amLODipine-benazepril (LOTREL) 5-10 MG capsule Take by mouth.    [name] aspirin 81 MG EC tablet 81 mg per 1 tablet, ORAL, Daily, 30 tablet, 0 Refill(s), Activate Med (Rx)    . atorvastatin (LIPITOR) 40 MG tablet Take 40 mg by mouth daily.    . colchicine 0.6 MG tablet Take 0.6 mg by mouth daily.     [name] doxazosin (CARDURA) 4 MG tablet Take 4 mg by mouth daily.     [name] doxycycline (VIBRAMYCIN) 100 MG capsule Take 100 mg by mouth 2 (two) times daily.    . febuxostat (ULORIC) 40 MG tablet Take 40 mg by mouth daily.    [name] gabapentin (NEURONTIN) 300 MG capsule Take by mouth.    [name] gemfibrozil (LOPID) 600 MG tablet Take  600 mg by mouth 2 (two) times daily before a meal.     . hydrochlorothiazide (HYDRODIURIL) 25 MG tablet TAKE 1 TABLET BY MOUTH EVERY DAY    . ibuprofen (ADVIL) 200 MG tablet Take 400 mg by mouth every 6 (six) hours as needed for moderate pain.    . indomethacin (INDOCIN) 50 MG capsule Take 50 mg by mouth 2 (two) times daily.    [name] JARDIANCE 10 MG TABS tablet Take 10 mg by mouth daily.    [name] levothyroxine (SYNTHROID) 112 MCG tablet Take 112 mcg by mouth daily before breakfast.     . magnesium oxide (MAG-OX) 400 MG tablet Take by mouth.    . metFORMIN (GLUCOPHAGE-XR) 500 MG 24 hr tablet Take 500 mg by mouth 2 (two) times daily with a meal.    . metoprolol tartrate (LOPRESSOR) 50 MG tablet Take by mouth.    . oxybutynin (DITROPAN-XL) 10 MG 24 hr tablet Take 10 mg by mouth at bedtime.     . pantoprazole (PROTONIX) 40 MG tablet Take 40 mg by mouth 2 (two) times daily.     [name] PARoxetine (PAXIL) 40 MG tablet Take 40 mg by mouth daily.     . potassium chloride SA (KLOR-CON) 20 MEQ tablet Take 20 mEq by mouth 3 (three) times daily.     . pregabalin (LYRICA) 75 MG capsule Take 75 mg by mouth 2 (two) times daily.     . Rivaroxaban (XARELTO) 15 MG TABS tablet Take 1 tablet (15 mg total) by mouth 2 (two) times daily. 39 tablet 0  . [START ON 06/30/2019] rivaroxaban (XARELTO) 20 MG TABS tablet Take 1 tablet (20 mg total) by mouth daily. 30 tablet 2  . temazepam (RESTORIL) 15 MG capsule Take 15 mg by mouth at bedtime as needed.    . testosterone cypionate (DEPOTESTOTERONE CYPIONATE) 100 MG/ML injection 100 mg every 14 (fourteen) days.    . clotrimazole (LOTRIMIN) 1 % cream APPLY EXTERNALLY TO THE AFFECTED AREA TWICE DAILY     No current facility-administered medications for this visit.   ROS:  General: No weight loss, Fever, chills  HEENT: No recent headaches, no nasal bleeding, no visual changes, no sore throat  Neurologic: No dizziness, blackouts, seizures. No recent symptoms of stroke or mini- stroke. No  recent episodes of slurred speech, or temporary blindness.  Cardiac: No recent episodes of chest pain/pressure, no shortness of breath at rest. No shortness of  breath with exertion. + history of atrial fibrillation or irregular heartbeat  Vascular: No history of rest pain in feet. No history of claudication. No history of non-healing ulcer, No history of DVT  Pulmonary: No home oxygen, no productive cough, no hemoptysis, No asthma or wheezing  Musculoskeletal: [ ]  Arthritis, [ ]  Low back pain, [ ]  Joint pain  Hematologic:No history of hypercoagulable state. No history of easy bleeding. No history of anemia  Gastrointestinal: No hematochezia or melena, No gastroesophageal reflux, no trouble swallowing  Urinary: [ ]  X chronic Kidney disease, [ ]  on HD - [ ]  MWF or [ ]  TTHS, [ ]  Burning with urination, [ ]  Frequent urination, [ ]  Difficulty urinating;  Skin: No rashes  Psychological: No history of anxiety, No history of depression   Physical Examination  Vitals:   06/24/19 2137  BP: (!) 139/98  Pulse: 77  Temp: 98.3 F (36.8 C)  SpO2: 97%    Body mass index is 34.18 kg/m.   General: Alert and oriented, no acute distress  HEENT: Normal  Neck: No JVD  Cardiac: Irregularly irregular  Abdomen: Soft, non-tender, non-distended, no mass  Skin: No rash, early gangrenous changes medial aspect left first toe rubor/dusky appearance right foot   Extremity Pulses: 2+ radial, brachial, femoral, 2+ left dorsalis pedis, absent right popliteal and DP pulse absent posterior tibial pulses bilaterally  Musculoskeletal: No deformity or edema  Neurologic: Upper and lower extremity motor 5/5 and symmetric  DATA:  Patient had bilateral ABIs performed today. Right was 0.3 left 1.29    ASSESSMENT: Right foot ischemia with early gangrenous changes left first toe difficult to know whether or not this is underlying chronic peripheral arterial disease or potentially embolic phenomenon from what I suspect is  atrial fibrillation.  PLAN: Patient will stop his Xarelto today. Intervention for his right foot is going to be complicated by the fact that he has acute DVT.  Plan will be to admit him to the hospital tomorrow for IV heparin over the weekend with a plan to do an aortogram lower extremity runoff possible intervention by Dr. Donzetta Matters on Monday.  He will also need an echocardiogram and a twelve-lead EKG tomorrow as I suspect he is in atrial fibrillation.   Anthony Roland C. Donzetta Matters, MD [facility] Office: [phone] Pager: [phone]

## 2019-06-24 NOTE — Progress Notes (Signed)
[place] for heparin Indication: VTE treatment  Heparin Dosing Weight: 100.7 kg  Labs: No results for input(s): HGB, HCT, PLT, APTT, LABPROT, INR, HEPARINUNFRC, HEPRLOWMOCWT, CREATININE, CKTOTAL, CKMB, TROPONINI in the last 72 hours.  Assessment: 72 yom with history of thrombocytopenia and recent R femoral DVT on Xarelto PTA, remote hx of afib presenting with R foot ischemia with early gangrenous changes to L first toe. Xarelto held, and Pharmacy consulted to dose heparin in anticipation of aortogram lower extremity runoff with possible intervention by Dr. Donzetta Matters on Monday. Last dose of Xarelto 2/25 PM per discussion with patient. Ok to start heparin now, as sufficient time has passed since last Xarelto dose.  Goal of Therapy:  Heparin level 0.3-0.7 units/ml aPTT 66-102 seconds Monitor platelets by anticoagulation protocol: Yes   Plan:  Xarelto on hold. Check baseline aPTT/heparin level No bolus with recent Xarelto. Start heparin at 1700 units/hr 6h aPTT Monitor daily heparin level/aPTT/CBC, s/sx bleeding Vascular procedure planned Monday   Elicia Lamp, PharmD, BCPS Clinical Pharmacist 06/24/2019 10:00 PM

## 2019-06-25 ENCOUNTER — Inpatient Hospital Stay (HOSPITAL_COMMUNITY): Payer: Medicare Other

## 2019-06-25 DIAGNOSIS — I4891 Unspecified atrial fibrillation: Secondary | ICD-10-CM

## 2019-06-25 DIAGNOSIS — I739 Peripheral vascular disease, unspecified: Secondary | ICD-10-CM

## 2019-06-25 LAB — SARS CORONAVIRUS 2 (TAT 6-24 HRS): SARS Coronavirus 2: NEGATIVE

## 2019-06-25 LAB — BASIC METABOLIC PANEL
Anion gap: 12 (ref 5–15)
BUN: 17 mg/dL (ref 8–23)
CO2: 23 mmol/L (ref 22–32)
Calcium: 8.6 mg/dL — ABNORMAL LOW (ref 8.9–10.3)
Chloride: 105 mmol/L (ref 98–111)
Creatinine, Ser: 1.26 mg/dL — ABNORMAL HIGH (ref 0.61–1.24)
GFR calc Af Amer: >60 mL/min (ref >60)
GFR calc non Af Amer: 58 mL/min — ABNORMAL LOW (ref >60)
Glucose, Bld: 111 mg/dL — ABNORMAL HIGH (ref 70–99)
Potassium: 3.3 mmol/L — ABNORMAL LOW (ref 3.5–5.1)
Sodium: 140 mmol/L (ref 135–145)

## 2019-06-25 LAB — APTT
aPTT: 36 seconds (ref 24–36)
aPTT: 122 seconds — ABNORMAL HIGH (ref 24–36)
aPTT: 146 seconds — ABNORMAL HIGH (ref 24–36)

## 2019-06-25 LAB — GLUCOSE, CAPILLARY
Glucose-Capillary: 108 mg/dL — ABNORMAL HIGH (ref 70–99)
Glucose-Capillary: 134 mg/dL — ABNORMAL HIGH (ref 70–99)
Glucose-Capillary: 92 mg/dL (ref 70–99)
Glucose-Capillary: 122 mg/dL — ABNORMAL HIGH (ref 70–99)

## 2019-06-25 LAB — CBC
HCT: 48.5 % (ref 39.0–52.0)
Hemoglobin: 16.5 g/dL (ref 13.0–17.0)
MCH: 32.6 pg (ref 26.0–34.0)
MCHC: 34 g/dL (ref 30.0–36.0)
MCV: 95.8 fL (ref 80.0–100.0)
Platelets: 149 10*3/uL — ABNORMAL LOW (ref 150–400)
RBC: 5.06 MIL/uL (ref 4.22–5.81)
RDW: 12.8 % (ref 11.5–15.5)
WBC: 5.5 10*3/uL (ref 4.0–10.5)
nRBC: 0 % (ref 0.0–0.2)

## 2019-06-25 LAB — ECHOCARDIOGRAM COMPLETE
Height: 72 in
Weight: 3856 oz

## 2019-06-25 LAB — HEPARIN LEVEL (UNFRACTIONATED): Heparin Unfractionated: 1.06 IU/mL — ABNORMAL HIGH (ref 0.30–0.70)

## 2019-06-25 MED ORDER — POTASSIUM CHLORIDE CRYS ER 20 MEQ PO TBCR
40.0000 meq | EXTENDED_RELEASE_TABLET | Freq: Once | ORAL | Status: AC
  Administered 2019-06-25: 40 meq via ORAL
  Filled 2019-06-25: qty 2

## 2019-06-25 MED ORDER — METOPROLOL TARTRATE 5 MG/5ML IV SOLN
5.0000 mg | Freq: Four times a day (QID) | INTRAVENOUS | Status: DC
  Administered 2019-06-25 – 2019-06-28 (×11): 5 mg via INTRAVENOUS
  Filled 2019-06-25 (×11): qty 5

## 2019-06-25 MED ORDER — HEPARIN (PORCINE) 25000 UT/250ML-% IV SOLN
1400.0000 [IU]/h | INTRAVENOUS | Status: DC
  Administered 2019-06-25: 1400 [IU]/h via INTRAVENOUS
  Administered 2019-06-26 – 2019-06-27 (×2): 1350 [IU]/h via INTRAVENOUS
  Filled 2019-06-25 (×2): qty 250

## 2019-06-25 MED ORDER — ZOLPIDEM TARTRATE 5 MG PO TABS: 5.0000 mg | ORAL_TABLET | Freq: Every evening | ORAL | Status: DC | PRN

## 2019-06-25 NOTE — Progress Notes (Signed)
ANTICOAGULATION CONSULT NOTE  Pharmacy Consult for heparin Indication: VTE treatment  Heparin Dosing Weight: 100.7 kg  Labs: Recent Labs    06/24/19 2221 06/25/19 0711 06/25/19 0844 06/25/19 1456  HGB 17.7* 16.5  --   --   HCT 51.3 48.5  --   --   PLT 165 149*  --   --   APTT 36 122*  --  146*  HEPARINUNFRC 1.01* 1.06*  --   --   CREATININE 1.47*  --  1.26*  --     Assessment: 24 yom with history of thrombocytopenia and recent R femoral DVT on Xarelto PTA, remote hx of afib presenting with R foot ischemia with early gangrenous changes to L first toe. Xarelto held, and Pharmacy consulted to dose heparin in anticipation of aortogram lower extremity runoff with possible intervention by Dr. Donzetta Matters on Monday. Last dose of Xarelto 2/25 PM per discussion with patient. Ok to start heparin now, as sufficient time has passed since last Xarelto dose.  APTT this afternoon came back supratherapeutic at 146, on 1600 units/hr. Hgb stable this morning at 16.5, plt 149 (down slightly). Heparin is infusing in L peripheral and level drawn from opposite. No s/sx of bleeding.   Goal of Therapy:  Heparin level 0.3-0.7 units/ml aPTT 66-102 seconds Monitor platelets by anticoagulation protocol: Yes   Plan:  Hold heparin infusion for 1 hour and reduce heparin infusion to 1400 units/hr 6h aPTT Monitor daily heparin level/aPTT/CBC, s/sx bleeding Vascular procedure planned Monday  Antonietta Jewel, PharmD, BCCCP Clinical Pharmacist  Phone: [phone]  Please check AMION for all [place] phone numbers After 10:00 PM, call [place] [phone] 06/25/2019 4:40 PM

## 2019-06-25 NOTE — Progress Notes (Signed)
  Progress Note    06/25/2019 1:01 PM * No surgery date entered *  Subjective:  No overnight events  Vitals:   06/25/19 0855 06/25/19 1122  BP: 115/83 135/82  Pulse: 97 90  Resp: 17 19  Temp: 98.4 F (36.9 C) 97.6 F (36.4 C)  SpO2: 94% 95%    Physical Exam: Awake alert and oriented Nonlabored respirations Abdomen is soft Bilateral common femoral pulses are palpable No palpable right lower extremity pulse below the knee Left posterior tibial dorsalis pedis pulses are palpable  CBC    Component Value Date/Time   WBC 5.5 06/25/2019 0711   RBC 5.06 06/25/2019 0711   HGB 16.5 06/25/2019 0711   HCT 48.5 06/25/2019 0711   PLT 149 (L) 06/25/2019 0711   MCV 95.8 06/25/2019 0711   MCH 32.6 06/25/2019 0711   MCHC 34.0 06/25/2019 0711   RDW 12.8 06/25/2019 0711   LYMPHSABS 1.0 06/09/2019 0559   MONOABS 0.5 06/09/2019 0559   EOSABS 0.1 06/09/2019 0559   BASOSABS 0.0 06/09/2019 0559    BMET    Component Value Date/Time   NA 140 06/25/2019 0844   K 3.3 (L) 06/25/2019 0844   CL 105 06/25/2019 0844   CO2 23 06/25/2019 0844   GLUCOSE 111 (H) 06/25/2019 0844   BUN 17 06/25/2019 0844   CREATININE 1.26 (H) 06/25/2019 0844   CALCIUM 8.6 (L) 06/25/2019 0844   GFRNONAA 58 (L) 06/25/2019 0844   GFRAA >60 06/25/2019 0844    INR No results found for: INR   Intake/Output Summary (Last 24 hours) at 06/25/2019 1301 Last data filed at 06/25/2019 1123 Gross per 24 hour  Intake 900.01 ml  Output 730 ml  Net 170.01 ml     Assessment/plan:  69 y.o. male is here with progressive gangrenous changes right first and second toes.  Plan will be for aortogram bilateral lower extremity runoff possible intervention on the right.  Patient has been on Xarelto now on heparin drip.  Will check echo    Ellicia Alix C. Donzetta Matters, MD [facility] Office: [phone] Pager: [phone]  06/25/2019 1:01 PM

## 2019-06-25 NOTE — Progress Notes (Signed)
Patient resting in bed he has had some short runs of H.R. going up to 130-140 At. Fib. and coming back down to 100-120's. PRN Lopressor is for H.R. staying >130.

## 2019-06-25 NOTE — Progress Notes (Signed)
Patient received via w.c. from home. Alert and orin. Skin warm and dry. No complaints voiced. Orin. To room and R.N. into do assess. Dr Donzetta Matters ws notified of adm. See orders. Patient has Hx. Of At. Fib. And is in At. Fib rate control 90's-to low 100's. At rest. R.N aware and cont. To monitor patient and rhythm. Potasium low 2.9 . Gave 40 meg Potassium p.o. per prn orders .

## 2019-06-25 NOTE — Progress Notes (Signed)
  Echocardiogram 2D Echocardiogram has been performed.  Johny Chess 06/25/2019, 11:54 AM

## 2019-06-25 NOTE — Progress Notes (Signed)
ANTICOAGULATION CONSULT NOTE  Pharmacy Consult for heparin Indication: VTE treatment  Heparin Dosing Weight: 100.7 kg  Labs: Recent Labs    06/24/19 2221 06/25/19 0711  HGB 17.7* 16.5  HCT 51.3 48.5  PLT 165 149*  APTT 36 122*  HEPARINUNFRC 1.01* 1.06*  CREATININE 1.47*  --     Assessment: 65 yom with history of thrombocytopenia and recent R femoral DVT on Xarelto PTA, remote hx of afib presenting with R foot ischemia with early gangrenous changes to L first toe. Xarelto held, and Pharmacy consulted to dose heparin in anticipation of aortogram lower extremity runoff with possible intervention by Dr. Donzetta Matters on Monday. Last dose of Xarelto 2/25 PM per discussion with patient. Ok to start heparin now, as sufficient time has passed since last Xarelto dose.  Pt's aPTT is supratherapeutic at 122. Heparin level appears to remain falsely elevated at 1.06 from recent xarelto exposure. Will dose based off of aPTTs until both heparin level and aPTTs are correlating. No signs/symptoms of bleeding or issues with infusion. CBC is stable and wnls. Plts are on low end at 149.  Goal of Therapy:  Heparin level 0.3-0.7 units/ml aPTT 66-102 seconds Monitor platelets by anticoagulation protocol: Yes   Plan:  Reduce heparin infusion to 1600 units/hr 6h aPTT Monitor daily heparin level/aPTT/CBC, s/sx bleeding Vascular procedure planned Monday   Sherren Kerns, PharmD PGY1 Acute Care Pharmacy Resident 06/25/2019 8:28 AM

## 2019-06-26 LAB — APTT
aPTT: 101 seconds — ABNORMAL HIGH (ref 24–36)
aPTT: 80 seconds — ABNORMAL HIGH (ref 24–36)
aPTT: 83 seconds — ABNORMAL HIGH (ref 24–36)

## 2019-06-26 LAB — CBC
HCT: 46.9 % (ref 39.0–52.0)
Hemoglobin: 15.7 g/dL (ref 13.0–17.0)
MCH: 32.3 pg (ref 26.0–34.0)
MCHC: 33.5 g/dL (ref 30.0–36.0)
MCV: 96.5 fL (ref 80.0–100.0)
Platelets: 136 10*3/uL — ABNORMAL LOW (ref 150–400)
RBC: 4.86 MIL/uL (ref 4.22–5.81)
RDW: 12.7 % (ref 11.5–15.5)
WBC: 6.1 10*3/uL (ref 4.0–10.5)
nRBC: 0 % (ref 0.0–0.2)

## 2019-06-26 LAB — BASIC METABOLIC PANEL
Anion gap: 12 (ref 5–15)
BUN: 17 mg/dL (ref 8–23)
CO2: 19 mmol/L — ABNORMAL LOW (ref 22–32)
Calcium: 7.9 mg/dL — ABNORMAL LOW (ref 8.9–10.3)
Chloride: 108 mmol/L (ref 98–111)
Creatinine, Ser: 1.22 mg/dL (ref 0.61–1.24)
GFR calc Af Amer: >60 mL/min (ref >60)
GFR calc non Af Amer: >60 mL/min (ref >60)
Glucose, Bld: 100 mg/dL — ABNORMAL HIGH (ref 70–99)
Potassium: 3.5 mmol/L (ref 3.5–5.1)
Sodium: 139 mmol/L (ref 135–145)

## 2019-06-26 LAB — GLUCOSE, CAPILLARY
Glucose-Capillary: 110 mg/dL — ABNORMAL HIGH (ref 70–99)
Glucose-Capillary: 96 mg/dL (ref 70–99)
Glucose-Capillary: 88 mg/dL (ref 70–99)
Glucose-Capillary: 128 mg/dL — ABNORMAL HIGH (ref 70–99)

## 2019-06-26 MED ORDER — POTASSIUM CHLORIDE CRYS ER 20 MEQ PO TBCR
40.0000 meq | EXTENDED_RELEASE_TABLET | Freq: Once | ORAL | Status: AC
  Administered 2019-06-26: 40 meq via ORAL
  Filled 2019-06-26: qty 2

## 2019-06-26 MED ORDER — ASPIRIN EC 81 MG PO TBEC
81.0000 mg | DELAYED_RELEASE_TABLET | Freq: Every day | ORAL | Status: DC
  Administered 2019-06-26 – 2019-07-01 (×6): 81 mg via ORAL
  Filled 2019-06-26 (×6): qty 1

## 2019-06-26 NOTE — Progress Notes (Signed)
ANTICOAGULATION CONSULT NOTE  Pharmacy Consult for heparin Indication: VTE treatment  Heparin Dosing Weight: 100.7 kg  Labs: Recent Labs    06/24/19 2221 06/24/19 2221 06/25/19 0711 06/25/19 0844 06/25/19 1456 06/26/19 0013 06/26/19 0859 06/26/19 1622  HGB 17.7*   < > 16.5  --   --  15.7  --   --   HCT 51.3  --  48.5  --   --  46.9  --   --   PLT 165  --  149*  --   --  136*  --   --   APTT 36   < > 122*  --    < > 83* 101* 80*  HEPARINUNFRC 1.01*  --  1.06*  --   --   --   --   --   CREATININE 1.47*  --   --  1.26*  --   --  1.22  --    < > = values in this interval not displayed.    Assessment: 18 yom with history of thrombocytopenia and recent R femoral DVT on Xarelto PTA, remote hx of afib presenting with R foot ischemia with early gangrenous changes to L first toe. Xarelto held, and Pharmacy consulted to dose heparin in anticipation of aortogram lower extremity runoff with possible intervention by Dr. Donzetta Matters on Monday. Last dose of Xarelto 2/25 PM per discussion with patient. Ok to start heparin now, as sufficient time has passed since last Xarelto dose.  APTT came back therapeutic at 80, on 1350 units/hr. Hgb down a bit but stable this morning at 15.7, plt 136 (down slightly). No infusion issues or s/sx of bleeding per RN.   Goal of Therapy:  Heparin level 0.3-0.7 units/ml aPTT 66-102 seconds Monitor platelets by anticoagulation protocol: Yes   Plan:  Continue heparin infusion at 1350 units/hr Monitor daily heparin level/aPTT/CBC, s/sx bleeding Vascular procedure planned Monday  Antonietta Jewel, PharmD, BCCCP Clinical Pharmacist  Phone: [phone]  Please check AMION for all [place] phone numbers After 10:00 PM, call [place] [phone] 06/26/2019 5:22 PM

## 2019-06-26 NOTE — Progress Notes (Addendum)
  Progress Note    06/26/2019 10:04 AM * No surgery date entered *  Subjective: No overnight issues  Vitals:   06/26/19 0440 06/26/19 0922  BP: 125/82 118/78  Pulse: 100 93  Resp: 20 10  Temp: 98.5 F (36.9 C) 98 F (36.7 C)  SpO2: 97% 93%    Physical Exam: Awake alert oriented Nonlabored respirations Abdomen is soft Bilateral femoral pulses are palpable Dressing on right great and second toe is clean dry intact  CBC    Component Value Date/Time   WBC 6.1 06/26/2019 0013   RBC 4.86 06/26/2019 0013   HGB 15.7 06/26/2019 0013   HCT 46.9 06/26/2019 0013   PLT 136 (L) 06/26/2019 0013   MCV 96.5 06/26/2019 0013   MCH 32.3 06/26/2019 0013   MCHC 33.5 06/26/2019 0013   RDW 12.7 06/26/2019 0013   LYMPHSABS 1.0 06/09/2019 0559   MONOABS 0.5 06/09/2019 0559   EOSABS 0.1 06/09/2019 0559   BASOSABS 0.0 06/09/2019 0559    BMET    Component Value Date/Time   NA 140 06/25/2019 0844   K 3.3 (L) 06/25/2019 0844   CL 105 06/25/2019 0844   CO2 23 06/25/2019 0844   GLUCOSE 111 (H) 06/25/2019 0844   BUN 17 06/25/2019 0844   CREATININE 1.26 (H) 06/25/2019 0844   CALCIUM 8.6 (L) 06/25/2019 0844   GFRNONAA 58 (L) 06/25/2019 0844   GFRAA >60 06/25/2019 0844    INR No results found for: INR   Intake/Output Summary (Last 24 hours) at 06/26/2019 1004 Last data filed at 06/26/2019 0403 Gross per 24 hour  Intake --  Output 830 ml  Net -830 ml     Assessment:  69 y.o. male is here with subacute right lower extremity critical limb ischemia.  Echo performed yesterday without any source of embolus.  Plan: PV lab tomorrow for aortogram with possible invention on the right.  Have discussed risk benefits alternatives with patient he demonstrates good understanding.  N.p.o. past midnight  He is in atrial fibrillation currently on heparin drip.  On aspirin and statin.   Reese Stockman C. Donzetta Matters, MD [facility] Office: [phone] Pager:  [phone]  06/26/2019 10:04 AM

## 2019-06-26 NOTE — Progress Notes (Signed)
ANTICOAGULATION CONSULT NOTE  Pharmacy Consult for Heparin Indication: VTE treatment  Heparin Dosing Weight: 100.7 kg  Labs: Recent Labs    06/24/19 2221 06/24/19 2221 06/25/19 0711 06/25/19 0844 06/25/19 1456 06/26/19 0013  HGB 17.7*   < > 16.5  --   --  15.7  HCT 51.3  --  48.5  --   --  46.9  PLT 165  --  149*  --   --  136*  APTT 36   < > 122*  --  146* 83*  HEPARINUNFRC 1.01*  --  1.06*  --   --   --   CREATININE 1.47*  --   --  1.26*  --   --    < > = values in this interval not displayed.    Assessment: 5 yom with history of thrombocytopenia and recent R femoral DVT on Xarelto PTA, remote hx of afib presenting with R foot ischemia with early gangrenous changes to L first toe. Xarelto held, and Pharmacy consulted to dose heparin in anticipation of aortogram lower extremity runoff with possible intervention by Dr. Donzetta Matters on Monday. Last dose of Xarelto 2/25 PM per discussion with patient. Ok to start heparin now, as sufficient time has passed since last Xarelto dose.  APTT this afternoon came back supratherapeutic at 146, on 1600 units/hr. Hgb stable this morning at 16.5, plt 149 (down slightly). Heparin is infusing in L peripheral and level drawn from opposite. No s/sx of bleeding.   2/28 AM update:  APTT therapeutic x 1 after rate decrease  Goal of Therapy:  Heparin level 0.3-0.7 units/ml aPTT 66-102 seconds Monitor platelets by anticoagulation protocol: Yes   Plan:  Cont heparin at 1400 units/hr Confirmatory aPTT at 0900 Monitor daily heparin level/aPTT/CBC, s/sx bleeding Vascular procedure planned Monday  Narda Bonds, PharmD, BCPS Clinical Pharmacist Phone: [phone]

## 2019-06-26 NOTE — Progress Notes (Signed)
ANTICOAGULATION CONSULT NOTE  Pharmacy Consult for heparin Indication: VTE treatment  Heparin Dosing Weight: 100.7 kg  Labs: Recent Labs    06/24/19 2221 06/24/19 2221 06/25/19 0711 06/25/19 0711 06/25/19 0844 06/25/19 1456 06/26/19 0013 06/26/19 0859  HGB 17.7*   < > 16.5  --   --   --  15.7  --   HCT 51.3  --  48.5  --   --   --  46.9  --   PLT 165  --  149*  --   --   --  136*  --   APTT 36   < > 122*   < >  --  146* 83* 101*  HEPARINUNFRC 1.01*  --  1.06*  --   --   --   --   --   CREATININE 1.47*  --   --   --  1.26*  --   --   --    < > = values in this interval not displayed.    Assessment: 34 yom with history of thrombocytopenia and recent R femoral DVT on Xarelto PTA, remote hx of afib presenting with R foot ischemia with early gangrenous changes to L first toe. Xarelto held, and Pharmacy consulted to dose heparin in anticipation of aortogram lower extremity runoff with possible intervention by Dr. Donzetta Matters on Monday. Last dose of Xarelto 2/25 PM per discussion with patient. Ok to start heparin now, as sufficient time has passed since last Xarelto dose.  APTT this afternoon came back on higher end of therapeutic range at 101 with heparin infusion at 1,400units/hr. Hgb down a bit but stable this morning at 15.7, plt 136 (down slightly). No issues with infusion or bleeding per RN. Level drawn appropriately.  Goal of Therapy:  Heparin level 0.3-0.7 units/ml aPTT 66-102 seconds Monitor platelets by anticoagulation protocol: Yes   Plan:  Reduce heparin infusion to 1350 units/hr due to labile aPTTs 6h aPTT Monitor daily heparin level/aPTT/CBC, s/sx bleeding Vascular procedure planned Monday  Sherren Kerns, PharmD PGY1 [facility] Resident Please check AMION for all [place] phone numbers After 10:00 PM, call [place] [phone] 06/26/2019 10:08 AM

## 2019-06-27 ENCOUNTER — Inpatient Hospital Stay (HOSPITAL_COMMUNITY): Payer: Medicare Other | Admitting: Certified Registered Nurse Anesthetist

## 2019-06-27 ENCOUNTER — Encounter (HOSPITAL_COMMUNITY)
Admission: AD | Disposition: A | Discharge status: Home / Self Care | Payer: Self-pay | Source: Ambulatory Visit | Attending: Vascular Surgery

## 2019-06-27 ENCOUNTER — Encounter (HOSPITAL_COMMUNITY): Payer: Self-pay | Admitting: Vascular Surgery

## 2019-06-27 DIAGNOSIS — I998 Other disorder of circulatory system: Secondary | ICD-10-CM

## 2019-06-27 HISTORY — PX: ABDOMINAL AORTOGRAM W/LOWER EXTREMITY: CATH118223

## 2019-06-27 HISTORY — PX: EMBOLECTOMY: SHX44

## 2019-06-27 LAB — CBC
HCT: 47.5 % (ref 39.0–52.0)
Hemoglobin: 15.3 g/dL (ref 13.0–17.0)
MCH: 32.4 pg (ref 26.0–34.0)
MCHC: 32.2 g/dL (ref 30.0–36.0)
MCV: 100.6 fL — ABNORMAL HIGH (ref 80.0–100.0)
Platelets: 122 10*3/uL — ABNORMAL LOW (ref 150–400)
RBC: 4.72 MIL/uL (ref 4.22–5.81)
RDW: 12.9 % (ref 11.5–15.5)
WBC: 5.7 10*3/uL (ref 4.0–10.5)
nRBC: 0 % (ref 0.0–0.2)

## 2019-06-27 LAB — GLUCOSE, CAPILLARY
Glucose-Capillary: 97 mg/dL (ref 70–99)
Glucose-Capillary: 93 mg/dL (ref 70–99)
Glucose-Capillary: 99 mg/dL (ref 70–99)
Glucose-Capillary: 112 mg/dL — ABNORMAL HIGH (ref 70–99)

## 2019-06-27 LAB — POCT ACTIVATED CLOTTING TIME: Activated Clotting Time: 147 seconds

## 2019-06-27 LAB — HEPARIN LEVEL (UNFRACTIONATED): Heparin Unfractionated: 0.24 IU/mL — ABNORMAL LOW (ref 0.30–0.70)

## 2019-06-27 LAB — POCT I-STAT, CHEM 8
BUN: 16 mg/dL (ref 8–23)
Calcium, Ion: 0.93 mmol/L — ABNORMAL LOW (ref 1.15–1.40)
Chloride: 108 mmol/L (ref 98–111)
Creatinine, Ser: 1 mg/dL (ref 0.61–1.24)
Glucose, Bld: 87 mg/dL (ref 70–99)
HCT: 50 % (ref 39.0–52.0)
Hemoglobin: 17 g/dL (ref 13.0–17.0)
Potassium: 3.7 mmol/L (ref 3.5–5.1)
Sodium: 138 mmol/L (ref 135–145)
TCO2: 22 mmol/L (ref 22–32)

## 2019-06-27 LAB — APTT: aPTT: 78 seconds — ABNORMAL HIGH (ref 24–36)

## 2019-06-27 SURGERY — EMBOLECTOMY
Anesthesia: General | Laterality: Right

## 2019-06-27 SURGERY — ABDOMINAL AORTOGRAM W/LOWER EXTREMITY
Anesthesia: LOCAL | Laterality: Bilateral

## 2019-06-27 MED ORDER — ROCURONIUM BROMIDE 50 MG/5ML IV SOSY
PREFILLED_SYRINGE | INTRAVENOUS | Status: DC | PRN
  Administered 2019-06-27: 20 mg via INTRAVENOUS
  Administered 2019-06-27: 60 mg via INTRAVENOUS

## 2019-06-27 MED ORDER — DEXAMETHASONE SODIUM PHOSPHATE 10 MG/ML IJ SOLN
INTRAMUSCULAR | Status: AC
  Filled 2019-06-27: qty 1

## 2019-06-27 MED ORDER — ALBUMIN HUMAN 5 % IV SOLN: INTRAVENOUS | Status: DC | PRN

## 2019-06-27 MED ORDER — SODIUM CHLORIDE 0.9 % IV SOLN
INTRAVENOUS | Status: DC | PRN
  Administered 2019-06-27: 500 mL

## 2019-06-27 MED ORDER — SODIUM CHLORIDE 0.9 % IV SOLN: INTRAVENOUS | Status: AC

## 2019-06-27 MED ORDER — LABETALOL HCL 5 MG/ML IV SOLN: 10.0000 mg | INTRAVENOUS | Status: DC | PRN

## 2019-06-27 MED ORDER — HEPARIN SODIUM (PORCINE) 1000 UNIT/ML IJ SOLN
INTRAMUSCULAR | Status: DC | PRN
  Administered 2019-06-27: 10000 [IU] via INTRAVENOUS

## 2019-06-27 MED ORDER — FENTANYL CITRATE (PF) 100 MCG/2ML IJ SOLN
INTRAMUSCULAR | Status: DC | PRN
  Administered 2019-06-27: 100 ug via INTRAVENOUS
  Administered 2019-06-27: 50 ug via INTRAVENOUS

## 2019-06-27 MED ORDER — ONDANSETRON HCL 4 MG/2ML IJ SOLN: 4.0000 mg | Freq: Four times a day (QID) | INTRAMUSCULAR | Status: DC | PRN

## 2019-06-27 MED ORDER — ACETAMINOPHEN 325 MG PO TABS: 650.0000 mg | ORAL_TABLET | ORAL | Status: DC | PRN

## 2019-06-27 MED ORDER — MORPHINE SULFATE (PF) 2 MG/ML IV SOLN: 2.0000 mg | INTRAVENOUS | Status: DC | PRN

## 2019-06-27 MED ORDER — HEPARIN (PORCINE) IN NACL 1000-0.9 UT/500ML-% IV SOLN
INTRAVENOUS | Status: DC | PRN
  Administered 2019-06-27 (×2): 500 mL

## 2019-06-27 MED ORDER — MIDAZOLAM HCL 2 MG/2ML IJ SOLN
INTRAMUSCULAR | Status: AC
  Filled 2019-06-27: qty 2

## 2019-06-27 MED ORDER — SODIUM CHLORIDE 0.9 % IV SOLN: 250.0000 mL | INTRAVENOUS | Status: DC | PRN

## 2019-06-27 MED ORDER — CEFAZOLIN SODIUM 1 G IJ SOLR
INTRAMUSCULAR | Status: AC
  Filled 2019-06-27: qty 20

## 2019-06-27 MED ORDER — IODIXANOL 320 MG/ML IV SOLN
INTRAVENOUS | Status: DC | PRN
  Administered 2019-06-27: 110 mL via INTRA_ARTERIAL

## 2019-06-27 MED ORDER — PROPOFOL 10 MG/ML IV BOLUS
INTRAVENOUS | Status: AC
  Filled 2019-06-27: qty 20

## 2019-06-27 MED ORDER — ONDANSETRON HCL 4 MG/2ML IJ SOLN
INTRAMUSCULAR | Status: AC
  Filled 2019-06-27: qty 2

## 2019-06-27 MED ORDER — PHENYLEPHRINE 40 MCG/ML (10ML) SYRINGE FOR IV PUSH (FOR BLOOD PRESSURE SUPPORT)
PREFILLED_SYRINGE | INTRAVENOUS | Status: DC | PRN
  Administered 2019-06-27: 80 ug via INTRAVENOUS
  Administered 2019-06-27 (×2): 120 ug via INTRAVENOUS
  Administered 2019-06-27: 80 ug via INTRAVENOUS

## 2019-06-27 MED ORDER — HYDRALAZINE HCL 20 MG/ML IJ SOLN
INTRAMUSCULAR | Status: DC | PRN
  Administered 2019-06-27: 10 mg via INTRAVENOUS

## 2019-06-27 MED ORDER — LIDOCAINE 2% (20 MG/ML) 5 ML SYRINGE
INTRAMUSCULAR | Status: DC | PRN
  Administered 2019-06-27: 100 mg via INTRAVENOUS

## 2019-06-27 MED ORDER — LIDOCAINE HCL (PF) 1 % IJ SOLN
INTRAMUSCULAR | Status: AC
  Filled 2019-06-27: qty 30

## 2019-06-27 MED ORDER — PHENYLEPHRINE 40 MCG/ML (10ML) SYRINGE FOR IV PUSH (FOR BLOOD PRESSURE SUPPORT)
PREFILLED_SYRINGE | INTRAVENOUS | Status: AC
  Filled 2019-06-27: qty 10

## 2019-06-27 MED ORDER — LIDOCAINE HCL (PF) 1 % IJ SOLN
INTRAMUSCULAR | Status: DC | PRN
  Administered 2019-06-27: 15 mL via INTRADERMAL

## 2019-06-27 MED ORDER — SODIUM CHLORIDE 0.9% FLUSH: 3.0000 mL | INTRAVENOUS | Status: DC | PRN

## 2019-06-27 MED ORDER — HYDRALAZINE HCL 20 MG/ML IJ SOLN
INTRAMUSCULAR | Status: AC
  Filled 2019-06-27: qty 1

## 2019-06-27 MED ORDER — PROMETHAZINE HCL 25 MG/ML IJ SOLN: 6.2500 mg | INTRAMUSCULAR | Status: DC | PRN

## 2019-06-27 MED ORDER — FENTANYL CITRATE (PF) 100 MCG/2ML IJ SOLN: 25.0000 ug | INTRAMUSCULAR | Status: DC | PRN

## 2019-06-27 MED ORDER — HYDRALAZINE HCL 20 MG/ML IJ SOLN: 5.0000 mg | INTRAMUSCULAR | Status: DC | PRN

## 2019-06-27 MED ORDER — EPHEDRINE SULFATE-NACL 50-0.9 MG/10ML-% IV SOSY
PREFILLED_SYRINGE | INTRAVENOUS | Status: DC | PRN
  Administered 2019-06-27: 10 mg via INTRAVENOUS
  Administered 2019-06-27: 15 mg via INTRAVENOUS

## 2019-06-27 MED ORDER — ROCURONIUM BROMIDE 10 MG/ML (PF) SYRINGE
PREFILLED_SYRINGE | INTRAVENOUS | Status: AC
  Filled 2019-06-27: qty 10

## 2019-06-27 MED ORDER — HEPARIN (PORCINE) 25000 UT/250ML-% IV SOLN
1500.0000 [IU]/h | INTRAVENOUS | Status: DC
  Administered 2019-06-27: 1400 [IU]/h via INTRAVENOUS
  Administered 2019-06-28 – 2019-06-29 (×2): 1500 [IU]/h via INTRAVENOUS
  Filled 2019-06-27 (×3): qty 250

## 2019-06-27 MED ORDER — PHENYLEPHRINE HCL-NACL 10-0.9 MG/250ML-% IV SOLN
INTRAVENOUS | Status: DC | PRN
  Administered 2019-06-27: 50 ug/min via INTRAVENOUS

## 2019-06-27 MED ORDER — MIDAZOLAM HCL 5 MG/5ML IJ SOLN
INTRAMUSCULAR | Status: DC | PRN
  Administered 2019-06-27: 2 mg via INTRAVENOUS

## 2019-06-27 MED ORDER — PROPOFOL 10 MG/ML IV BOLUS
INTRAVENOUS | Status: DC | PRN
  Administered 2019-06-27: 90 mg via INTRAVENOUS
  Administered 2019-06-27: 100 mg via INTRAVENOUS

## 2019-06-27 MED ORDER — SODIUM CHLORIDE 0.9% FLUSH
3.0000 mL | Freq: Two times a day (BID) | INTRAVENOUS | Status: DC
  Administered 2019-06-27 – 2019-06-30 (×5): 3 mL via INTRAVENOUS

## 2019-06-27 MED ORDER — EPHEDRINE 5 MG/ML INJ
INTRAVENOUS | Status: AC
  Filled 2019-06-27: qty 10

## 2019-06-27 MED ORDER — CEFAZOLIN SODIUM-DEXTROSE 2-3 GM-%(50ML) IV SOLR
INTRAVENOUS | Status: DC | PRN
  Administered 2019-06-27: 2 g via INTRAVENOUS

## 2019-06-27 MED ORDER — LIDOCAINE 2% (20 MG/ML) 5 ML SYRINGE
INTRAMUSCULAR | Status: AC
  Filled 2019-06-27: qty 5

## 2019-06-27 MED ORDER — LACTATED RINGERS IV SOLN: INTRAVENOUS | Status: DC

## 2019-06-27 MED ORDER — DEXAMETHASONE SODIUM PHOSPHATE 10 MG/ML IJ SOLN
INTRAMUSCULAR | Status: DC | PRN
  Administered 2019-06-27: 5 mg via INTRAVENOUS

## 2019-06-27 MED ORDER — ONDANSETRON HCL 4 MG/2ML IJ SOLN
INTRAMUSCULAR | Status: DC | PRN
  Administered 2019-06-27: 4 mg via INTRAVENOUS

## 2019-06-27 MED ORDER — 0.9 % SODIUM CHLORIDE (POUR BTL) OPTIME
TOPICAL | Status: DC | PRN
  Administered 2019-06-27 (×2): 1000 mL

## 2019-06-27 MED ORDER — HEPARIN (PORCINE) IN NACL 1000-0.9 UT/500ML-% IV SOLN
INTRAVENOUS | Status: AC
  Filled 2019-06-27: qty 1000

## 2019-06-27 MED ORDER — SODIUM CHLORIDE 0.9 % IV SOLN
INTRAVENOUS | Status: AC
  Filled 2019-06-27: qty 1.2

## 2019-06-27 MED ORDER — FENTANYL CITRATE (PF) 250 MCG/5ML IJ SOLN
INTRAMUSCULAR | Status: AC
  Filled 2019-06-27: qty 5

## 2019-06-27 MED ORDER — SODIUM CHLORIDE 0.9 % IV SOLN: INTRAVENOUS | Status: DC

## 2019-06-27 SURGICAL SUPPLY — 63 items
BANDAGE ESMARK 6X9 LF (GAUZE/BANDAGES/DRESSINGS) IMPLANT
BNDG ESMARK 6X9 LF (GAUZE/BANDAGES/DRESSINGS)
CANISTER SUCT 3000ML PPV (MISCELLANEOUS) ×3 IMPLANT
CANNULA VESSEL 3MM 2 BLNT TIP (CANNULA) ×6 IMPLANT
CATH EMB 3FR 80CM (CATHETERS) IMPLANT
CATH EMB 4FR 80CM (CATHETERS) IMPLANT
CATH EMB 5FR 80CM (CATHETERS) IMPLANT
CLIP FOGARTY SPRING 6M (CLIP) ×6 IMPLANT
CLIP VESOCCLUDE MED 24/CT (CLIP) ×3 IMPLANT
CLIP VESOCCLUDE SM WIDE 24/CT (CLIP) ×3 IMPLANT
CONT SPEC 4OZ CLIKSEAL STRL BL (MISCELLANEOUS) ×3 IMPLANT
COVER WAND RF STERILE (DRAPES) ×3 IMPLANT
CUFF TOURN SGL QUICK 24 (TOURNIQUET CUFF)
CUFF TOURN SGL QUICK 34 (TOURNIQUET CUFF)
CUFF TOURN SGL QUICK 42 (TOURNIQUET CUFF) IMPLANT
CUFF TRNQT CYL 24X4X16.5-23 (TOURNIQUET CUFF) IMPLANT
CUFF TRNQT CYL 34X4.125X (TOURNIQUET CUFF) IMPLANT
DECANTER SPIKE VIAL GLASS SM (MISCELLANEOUS) IMPLANT
DERMABOND ADVANCED (GAUZE/BANDAGES/DRESSINGS) ×4
DERMABOND ADVANCED .7 DNX12 (GAUZE/BANDAGES/DRESSINGS) ×2 IMPLANT
DRAIN HEMOVAC 1/8 X 5 (WOUND CARE) ×3 IMPLANT
DRAIN SNY 10X20 3/4 PERF (WOUND CARE) IMPLANT
DRAPE HALF SHEET 70X43 (DRAPES) ×3 IMPLANT
DRAPE X-RAY CASS 24X20 (DRAPES) IMPLANT
ELECT REM PT RETURN 9FT ADLT (ELECTROSURGICAL) ×3
ELECTRODE REM PT RTRN 9FT ADLT (ELECTROSURGICAL) ×1 IMPLANT
EVACUATOR SILICONE 100CC (DRAIN) ×3 IMPLANT
GAUZE SPONGE 4X4 12PLY STRL (GAUZE/BANDAGES/DRESSINGS) ×3 IMPLANT
GLOVE BIO SURGEON STRL SZ7.5 (GLOVE) ×9 IMPLANT
GLOVE BIOGEL PI IND STRL 6.5 (GLOVE) ×2 IMPLANT
GLOVE BIOGEL PI IND STRL 7.5 (GLOVE) ×2 IMPLANT
GLOVE BIOGEL PI INDICATOR 6.5 (GLOVE) ×4
GLOVE BIOGEL PI INDICATOR 7.5 (GLOVE) ×4
GLOVE ECLIPSE 7.0 STRL STRAW (GLOVE) ×3 IMPLANT
GOWN STRL REUS W/ TWL LRG LVL3 (GOWN DISPOSABLE) ×3 IMPLANT
GOWN STRL REUS W/TWL LRG LVL3 (GOWN DISPOSABLE) ×6
KIT BASIN OR (CUSTOM PROCEDURE TRAY) ×3 IMPLANT
KIT TURNOVER KIT B (KITS) ×3 IMPLANT
LOOP VESSEL MINI RED (MISCELLANEOUS) ×3 IMPLANT
NS IRRIG 1000ML POUR BTL (IV SOLUTION) ×6 IMPLANT
PACK PERIPHERAL VASCULAR (CUSTOM PROCEDURE TRAY) ×3 IMPLANT
PAD ARMBOARD 7.5X6 YLW CONV (MISCELLANEOUS) ×6 IMPLANT
SET COLLECT BLD 21X3/4 12 (NEEDLE) IMPLANT
SPONGE SURGIFOAM ABS GEL 100 (HEMOSTASIS) IMPLANT
STAPLER VISISTAT 35W (STAPLE) IMPLANT
STOPCOCK 4 WAY LG BORE MALE ST (IV SETS) IMPLANT
SUT ETHILON 3 0 FSL (SUTURE) ×3 IMPLANT
SUT PROLENE 5 0 C 1 24 (SUTURE) ×6 IMPLANT
SUT PROLENE 6 0 CC (SUTURE) ×3 IMPLANT
SUT PROLENE 7 0 BV 1 (SUTURE) ×3 IMPLANT
SUT PROLENE 7 0 BV1 MDA (SUTURE) ×3 IMPLANT
SUT SILK 3 0 (SUTURE) ×2
SUT SILK 3-0 18XBRD TIE 12 (SUTURE) ×1 IMPLANT
SUT VIC AB 2-0 CTX 36 (SUTURE) ×3 IMPLANT
SUT VIC AB 3-0 SH 27 (SUTURE) ×2
SUT VIC AB 3-0 SH 27X BRD (SUTURE) ×1 IMPLANT
SUT VIC AB 4-0 PS2 18 (SUTURE) ×3 IMPLANT
SYR 3ML LL SCALE MARK (SYRINGE) ×3 IMPLANT
TOWEL GREEN STERILE (TOWEL DISPOSABLE) ×3 IMPLANT
TRAY FOLEY MTR SLVR 16FR STAT (SET/KITS/TRAYS/PACK) ×3 IMPLANT
TUBING EXTENTION W/L.L. (IV SETS) IMPLANT
UNDERPAD 30X30 (UNDERPADS AND DIAPERS) ×3 IMPLANT
WATER STERILE IRR 1000ML POUR (IV SOLUTION) ×3 IMPLANT

## 2019-06-27 SURGICAL SUPPLY — 12 items
CATH ANGIO 5F PIGTAIL 65CM (CATHETERS) ×1 IMPLANT
CATH CROSS OVER TEMPO 5F (CATHETERS) ×1 IMPLANT
CATH STRAIGHT 5FR 65CM (CATHETERS) ×1 IMPLANT
KIT PV (KITS) ×2 IMPLANT
SHEATH PINNACLE 5F 10CM (SHEATH) ×1 IMPLANT
SHEATH PROBE COVER 6X72 (BAG) ×1 IMPLANT
STOPCOCK MORSE 400PSI 3WAY (MISCELLANEOUS) ×1 IMPLANT
SYR MEDRAD MARK 7 150ML (SYRINGE) ×2 IMPLANT
TRANSDUCER W/STOPCOCK (MISCELLANEOUS) ×2 IMPLANT
TRAY PV CATH (CUSTOM PROCEDURE TRAY) ×2 IMPLANT
TUBING CIL FLEX 10 FLL-RA (TUBING) ×1 IMPLANT
WIRE BENTSON .035X145CM (WIRE) ×1 IMPLANT

## 2019-06-27 NOTE — Interval H&P Note (Signed)
History and Physical Interval Note:  06/27/2019 12:57 PM  Jonathon [name]  has presented today for surgery, with the diagnosis of PAD.  The various methods of treatment have been discussed with the patient and family. After consideration of risks, benefits and other options for treatment, the patient has consented to  Procedure(s): ABDOMINAL AORTOGRAM W/LOWER EXTREMITY (Bilateral) as a surgical intervention.  The patient's history has been reviewed, patient examined, no change in status, stable for surgery.  I have reviewed the patient's chart and labs.  Questions were answered to the patient's satisfaction.     [name]

## 2019-06-27 NOTE — Progress Notes (Addendum)
MOBILITY TEAM - Progress Note   06/27/19 1200  Mobility  Activity Ambulated in hall;Ambulated to bathroom  Level of Assistance Standby assist, set-up cues, supervision of patient - no hands on (Assist for lines)  Assistive Device Front wheel walker  Distance Ambulated (ft) 440 ft  Mobility Response Tolerated well  Bed Position  (seated in recliner)   Pre-activity: HR 90, BP 122/97, SpO2 100% on RA During activity: HR 137 (RN aware) Post-activity: HR 92, BP 133/88, SpO2 100% on RA   Mabeline Caras, PT, DPT Mobility Team Pager [phone]

## 2019-06-27 NOTE — Anesthesia Preprocedure Evaluation (Signed)
Anesthesia Evaluation  Patient identified by MRN, date of birth, ID band Patient awake    Reviewed: Allergy & Precautions, NPO status , Patient's Chart, lab work & pertinent test results  Airway Mallampati: II  TM Distance: >3 FB Neck ROM: Full    Dental  (+) Teeth Intact, Dental Advisory Given   Pulmonary sleep apnea , former smoker,    Pulmonary exam normal breath sounds clear to auscultation       Cardiovascular hypertension, + Peripheral Vascular Disease (embolus, right leg)  Normal cardiovascular exam Rhythm:Regular Rate:Normal     Neuro/Psych PSYCHIATRIC DISORDERS Anxiety negative neurological ROS     GI/Hepatic negative GI ROS, Neg liver ROS,   Endo/Other  diabetes, Type 2, Oral Hypoglycemic AgentsHypothyroidism Obesity   Renal/GU Renal InsufficiencyRenal disease     Musculoskeletal negative musculoskeletal ROS (+)   Abdominal   Peds  Hematology  (+) Blood dyscrasia (Xarelto), ,   Anesthesia Other Findings Day of surgery medications reviewed with the patient.  Reproductive/Obstetrics                             Anesthesia Physical Anesthesia Plan  ASA: III and emergent  Anesthesia Plan: General   Post-op Pain Management:    Induction: Intravenous  PONV Risk Score and Plan: 3 and Dexamethasone and Ondansetron  Airway Management Planned: Oral ETT  Additional Equipment:   Intra-op Plan:   Post-operative Plan: Extubation in OR  Informed Consent: I have reviewed the patients History and Physical, chart, labs and discussed the procedure including the risks, benefits and alternatives for the proposed anesthesia with the patient or authorized representative who has indicated his/her understanding and acceptance.     Dental advisory given  Plan Discussed with: CRNA  Anesthesia Plan Comments:         Anesthesia Quick Evaluation

## 2019-06-27 NOTE — Op Note (Signed)
Procedure: Right popliteal superficial femoral and tibial artery embolectomy  Preoperative diagnosis: Ischemia right foot  Postoperative diagnosis: Same  Anesthesia: General  Assistant: Gerri Lins, PA-C  Operative findings: #1 large amount of embolic material sent to pathology as a specimen  2.  Biphasic posterior tibial and dorsalis pedis Doppler signal at conclusion of case  Operative details: After obtaining form consent, the patient was taken the operating.  The patient was placed in supine position operating table.  After induction of general anesthesia and endotracheal intubation patient's right lower extremities prepped and draped in usual sterile fashion.  Next a longitudinal incision was made on the medial aspect of the right leg just below the knee.  Incision was carried down through subtenons tissues down the level of the saphenous vein.  Side branches on saphenous vein were ligated and divided tween silk ties as this was reflected towards the anterior aspect of the leg.  The incision was then deepened into the popliteal space.  Below-knee popliteal artery was dissected free circumferentially.  It was soft on palpation but thickened.  Dissection was carried down to the origins of the anterior tibial and peroneal trunk.  These were all dissected free circumferentially and Vesseloops placed around these.  Several soleal muscle fibers and vein branches were taken down with cautery.  Patient was given 10,000 units of intravenous heparin.  After 2 minutes circulation time, a transverse arteriotomy was made in the popliteal artery just above the takeoff of the tibial vessels.  #4 Fogarty catheter was used to thrombectomize the proximal aspect of the artery.  Multiple passes were made retrieving a large amount of embolic material and there was brisk arterial inflow.  2 clean passes were obtained.  This was then controlled with a vessel loop.  I then subsequently passed the 3 Fogarty catheter  down the anterior tibial artery multiple times until all embolic material was removed and there was brisk backbleeding from this as well.  Finally the 3 Fogarty catheter and a 4 Fogarty catheter was passed down the tibioperoneal trunk multiple passes until no further embolic material was retrieved.  The arteriotomy was then repaired using interrupted 7-0 Prolene sutures.  At completion there was good hemostasis and flow was restored to the tibial vessels.  There was good popliteal pulse.  There was biphasic posterior tibial and dorsalis pedis Doppler signal.  Hemostasis was obtained with direct pressure.  10 flat Jackson-Pratt drain was placed into the popliteal space and brought out through separate stab incision and sutured to the skin with nylon stitch.  Subcutaneous tissues were reapproximated using running 3-0 Vicryl suture.  Skin was closed with 4-0 Vicryl subcuticular stitch.  Dermabond was applied.  The patient tired procedure well and there were no complications.  The instrument sponge needle counts correct in the case.  The patient was taken the recovery in stable condition.  [name], MD [facility] Office: [phone]

## 2019-06-27 NOTE — Transfer of Care (Signed)
Immediate Anesthesia Transfer of Care Note  Patient: Jonathon [name]  Procedure(s) Performed: RIGHT LEG EMBOLECTOMY (Right )  Patient Location: PACU  Anesthesia Type:General  Level of Consciousness: drowsy and patient cooperative  Airway & Oxygen Therapy: Patient Spontanous Breathing and Patient connected to nasal cannula oxygen  Post-op Assessment: Report given to RN, Post -op Vital signs reviewed and stable and Patient moving all extremities  Post vital signs: Reviewed and stable  Last Vitals:  Vitals Value Taken Time  BP    Temp    Pulse 133 06/27/19 1743  Resp 19 06/27/19 1743  SpO2 94 % 06/27/19 1743  Vitals shown include unvalidated device data.  Last Pain:  Vitals:   06/27/19 1407  TempSrc:   PainSc: 0-No pain         Complications: No apparent anesthesia complications

## 2019-06-27 NOTE — Progress Notes (Signed)
Pt back to room 4E25. In bed. Alert and oriented. Left groin level 0.

## 2019-06-27 NOTE — Progress Notes (Signed)
ANTICOAGULATION CONSULT NOTE  Pharmacy Consult for heparin Indication: VTE treatment  Heparin Dosing Weight: 100.7 kg  Labs: Recent Labs    06/24/19 2221 06/24/19 2221 06/25/19 0711 06/25/19 0844 06/25/19 1456 06/26/19 0013 06/26/19 0013 06/26/19 0859 06/26/19 1622 06/27/19 0204 06/27/19 1429  HGB 17.7*   < > 16.5  --   --  15.7   < >  --   --  15.3 17.0  HCT 51.3   < > 48.5  --   --  46.9  --   --   --  47.5 50.0  PLT 165   < > 149*  --   --  136*  --   --   --  122*  --   APTT 36   < > 122*  --    < > 83*   < > 101* 80* 78*  --   HEPARINUNFRC 1.01*  --  1.06*  --   --   --   --   --   --  0.24*  --   CREATININE 1.47*   < >  --  1.26*  --   --   --  1.22  --   --  1.00   < > = values in this interval not displayed.    Assessment: 89 yom with history of thrombocytopenia and recent R femoral DVT on Xarelto PTA, remote hx of afib presenting with R foot ischemia with early gangrenous changes to L first toe. Xarelto held for aortogram lower extremity on 3/1 ( Last dose of Xarelto 2/25 PM per discussion with patient)  He is now post embolectomy and heparin to restart at 8pm. Last infusion rate was 1400 units/hr      Goal of Therapy:  Heparin level 0.3-0.7 units/ml aPTT 66-102 seconds Monitor platelets by anticoagulation protocol: Yes   Plan:  -restart heparin at 1400 units/hr at 8pm -Heparin level and CBC daily  Hildred Laser, PharmD Clinical Pharmacist **Pharmacist phone directory can now be found on [email or web address] ([email or web address]).  Listed under [place].

## 2019-06-27 NOTE — Progress Notes (Signed)
ANTICOAGULATION CONSULT NOTE  Pharmacy Consult for heparin Indication: VTE treatment  Heparin Dosing Weight: 100.7 kg  Labs: Recent Labs     0000 06/24/19 2221 06/24/19 2221 06/25/19 0711 06/25/19 0844 06/25/19 1456 06/26/19 0013 06/26/19 0013 06/26/19 0859 06/26/19 1622 06/27/19 0204  HGB   < > 17.7*   < > 16.5  --   --  15.7  --   --   --  15.3  HCT  --  51.3   < > 48.5  --   --  46.9  --   --   --  47.5  PLT  --  165   < > 149*  --   --  136*  --   --   --  122*  APTT  --  36   < > 122*  --    < > 83*   < > 101* 80* 78*  HEPARINUNFRC  --  1.01*  --  1.06*  --   --   --   --   --   --  0.24*  CREATININE  --  1.47*  --   --  1.26*  --   --   --  1.22  --   --    < > = values in this interval not displayed.    Assessment: 41 yom with history of thrombocytopenia and recent R femoral DVT on Xarelto PTA, remote hx of afib presenting with R foot ischemia with early gangrenous changes to L first toe. Xarelto held, and Pharmacy consulted to dose heparin in anticipation of aortogram lower extremity runoff with possible intervention by Dr. Donzetta Matters on Monday. Last dose of Xarelto 2/25 PM per discussion with patient. Ok to start heparin now, as sufficient time has passed since last Xarelto dose.  APTT therapeutic at 78 seconds, heparin level now correlated and slightly subtherapeutic at 0.24.  Plts low but stable, H/H wnl.    Goal of Therapy:  Heparin level 0.3-0.7 units/ml aPTT 66-102 seconds Monitor platelets by anticoagulation protocol: Yes   Plan:  Increase heparin gtt to 1400 units/hr F/u vascular procedure  Daily heparin level, CBC, s/s bleeding  Bertis Ruddy, PharmD Clinical Pharmacist Please check AMION for all [place] numbers 06/27/2019 7:58 AM

## 2019-06-27 NOTE — Anesthesia Procedure Notes (Signed)
Procedure Name: Intubation Date/Time: 06/27/2019 2:54 PM Performed by: Moshe Salisbury, CRNA Pre-anesthesia Checklist: Patient identified, Emergency Drugs available, Suction available and Patient being monitored Patient Re-evaluated:Patient Re-evaluated prior to induction Oxygen Delivery Method: Circle System Utilized Preoxygenation: Pre-oxygenation with 100% oxygen Induction Type: IV induction Ventilation: Mask ventilation without difficulty Laryngoscope Size: Mac and 4 Grade View: Grade II Tube type: Oral Tube size: 7.5 mm Number of attempts: 1 Airway Equipment and Method: Stylet Placement Confirmation: ETT inserted through vocal cords under direct vision,  positive ETCO2 and breath sounds checked- equal and bilateral Secured at: 23 cm Tube secured with: Tape Dental Injury: Teeth and Oropharynx as per pre-operative assessment

## 2019-06-27 NOTE — Op Note (Signed)
Procedure: Aortogram with bilateral lower extremity runoff  Preoperative diagnosis: Ischemia right foot  Postoperative diagnosis: Same  Anesthesia: Local  Operative findings: Occlusion right superficial femoral popliteal posterior tibial peroneal arteries with what appears to be embolic material one-vessel AT runoff  Operative details: After team informed consent, patient taken the [place] lab.  The patient placed in supine position angio table.  Both groins were prepped and draped usual sterile fashion.  Local anesthesia was infiltrated of the left common femoral artery.  Ultrasound was used to identify left common femoral artery and femoral bifurcation.  An introducer needle was used to cannulate the left common femoral artery and 035 Bentson wire advanced up the abdominal aorta under fluoroscopic guidance.  A 5 French sheath placed over the guidewire in the left common femoral artery.  This was thoroughly flushed with heparinized saline.  5 French pigtail catheter was advanced over the guidewire and abdominal aortogram was obtained AP projection.  Left and right renal arteries are patent.  Infrarenal abdominal aorta is patent.  Left and right common external and internal iliac arteries are all widely patent.  Next pigtail catheter was pulled down just above the aortic bifurcation pelvic angiogram was obtained to confirm the above findings.  At this point the pigtail catheter was removed over guidewire and exchanged for a 5 [place] crossover catheter.  This was used to selectively catheterize the right common iliac and then the guidewire advanced down into the distal right external iliac artery.  Crossover catheter was swapped out for a 5 [place] straight catheter.  Right lower extremity arteriogram was then obtained.  The right common femoral profunda femoris are all patent.  The right superficial femoral artery occludes about 5 cm after its origin with a meniscus suggestive of embolic material.  The  entire SFA is occluded.  There is a small island of contrast opacification in the popliteal artery.  The remainder of the popliteal artery is occluded and is very irregular in its surface in the below-knee segment.  The contrast does partially opacified the peroneal and posterior tibial arteries but they appear to have embolic material within them as well.  The anterior tibial artery is patent throughout its course.  At this point 5 French straight catheter was removed and left lower extremity arteriogram was obtained through the sheath.  The left common femoral profundofemoral and superficial femoral arteries are all patent.  Left popliteal artery is patent.  Left anterior tibial artery is the dominant runoff vessel to the left foot the peroneal and posterior tibial arteries are patent but are smaller in caliber.  At this point the procedure was concluded.  The patient tolerated procedure well and there were complications.  Patient was taken to the operating room for right leg embolectomy today.  [name], MD [facility] [place] Office: [phone]

## 2019-06-28 ENCOUNTER — Inpatient Hospital Stay (HOSPITAL_COMMUNITY): Payer: Medicare Other

## 2019-06-28 DIAGNOSIS — I4819 Other persistent atrial fibrillation: Secondary | ICD-10-CM

## 2019-06-28 DIAGNOSIS — Z9889 Other specified postprocedural states: Secondary | ICD-10-CM

## 2019-06-28 DIAGNOSIS — I739 Peripheral vascular disease, unspecified: Secondary | ICD-10-CM

## 2019-06-28 DIAGNOSIS — M62262 Nontraumatic ischemic infarction of muscle, left lower leg: Secondary | ICD-10-CM

## 2019-06-28 LAB — CBC
HCT: 43.4 % (ref 39.0–52.0)
Hemoglobin: 14.2 g/dL (ref 13.0–17.0)
MCH: 32 pg (ref 26.0–34.0)
MCHC: 32.7 g/dL (ref 30.0–36.0)
MCV: 97.7 fL (ref 80.0–100.0)
Platelets: 114 10*3/uL — ABNORMAL LOW (ref 150–400)
RBC: 4.44 MIL/uL (ref 4.22–5.81)
RDW: 12.8 % (ref 11.5–15.5)
WBC: 7.5 10*3/uL (ref 4.0–10.5)
nRBC: 0 % (ref 0.0–0.2)

## 2019-06-28 LAB — GLUCOSE, CAPILLARY
Glucose-Capillary: 154 mg/dL — ABNORMAL HIGH (ref 70–99)
Glucose-Capillary: 148 mg/dL — ABNORMAL HIGH (ref 70–99)
Glucose-Capillary: 116 mg/dL — ABNORMAL HIGH (ref 70–99)
Glucose-Capillary: 159 mg/dL — ABNORMAL HIGH (ref 70–99)

## 2019-06-28 LAB — HEPARIN LEVEL (UNFRACTIONATED)
Heparin Unfractionated: 0.29 IU/mL — ABNORMAL LOW (ref 0.30–0.70)
Heparin Unfractionated: 0.53 IU/mL (ref 0.30–0.70)

## 2019-06-28 MED ORDER — LOSARTAN POTASSIUM 25 MG PO TABS
12.5000 mg | ORAL_TABLET | Freq: Every day | ORAL | Status: DC
  Administered 2019-06-28 – 2019-07-01 (×4): 12.5 mg via ORAL
  Filled 2019-06-28 (×4): qty 1

## 2019-06-28 MED ORDER — METOPROLOL SUCCINATE ER 50 MG PO TB24
50.0000 mg | ORAL_TABLET | Freq: Every day | ORAL | Status: DC
  Administered 2019-06-28 – 2019-06-29 (×2): 50 mg via ORAL
  Filled 2019-06-28 (×2): qty 1

## 2019-06-28 NOTE — Progress Notes (Addendum)
[facility] [place]  Subjective  - No new complaints.   Objective (!) 98/56 92 98 F (36.7 C) (Oral) 16 95%  Intake/Output Summary (Last 24 hours) at 06/28/2019 0749 Last data filed at 06/28/2019 0657 Gross per 24 hour  Intake 1502.9 ml  Output 1885 ml  Net -382.1 ml    Right GT and second toe with ischemic skin changes, 2 x 2 placed between toes Min-mod right LE edema, foot warm to touch, doppler signals brisk.PT/DP/Peroneal. Right BK pop incision healing well, JP 235 cc OP Heart A fib  Lungs non labored breathing  Assessment/Planning: POD #Right BKP thrombectomy Patent blood flow with brisk doppler signals Heparin full dose for A fib, maintain drain   I will call Cardiology consult later today  Jonathon [name] 06/28/2019 7:49 AM --  2+ right DP pulse today Incision clean ABI improved from 0.4 to > 1 Continue heparin JP until output less than 30 cc for 24 hr Resume NOAC for AFib and DVT after JP is out Cardiology to see today for additional rate control and long term afib management  [name], MD [facility] [place]: [phone]   Laboratory Lab Results: Recent Labs    06/27/19 0204 06/27/19 0204 06/27/19 1429 06/28/19 0214  WBC 5.7  --   --  7.5  HGB 15.3   < > 17.0 14.2  HCT 47.5   < > 50.0 43.4  PLT 122*  --   --  114*   < > = values in this interval not displayed.   BMET Recent Labs    06/25/19 0844 06/25/19 0844 06/26/19 0859 06/27/19 1429  NA 140   < > 139 138  K 3.3*   < > 3.5 3.7  CL 105   < > 108 108  CO2 23  --  19*  --   GLUCOSE 111*   < > 100* 87  BUN 17   < > 17 16  CREATININE 1.26*   < > 1.22 1.00  CALCIUM 8.6*  --  7.9*  --    < > = values in this interval not displayed.    COAG No results found for: INR, PROTIME No results found for: PTT

## 2019-06-28 NOTE — Progress Notes (Signed)
ANTICOAGULATION CONSULT NOTE  Pharmacy Consult for heparin Indication: VTE treatment  Heparin Dosing Weight: 100.7 kg  Labs: Recent Labs     0000 06/25/19 0711 06/25/19 0844 06/25/19 1456 06/26/19 0013 06/26/19 0013 06/26/19 0859 06/26/19 1622 06/27/19 0204 06/27/19 0204 06/27/19 1429 06/28/19 0214  HGB   < > 16.5  --   --  15.7   < >  --   --  15.3   < > 17.0 14.2  HCT   < > 48.5  --   --  46.9   < >  --   --  47.5  --  50.0 43.4  PLT   < > 149*  --   --  136*  --   --   --  122*  --   --  114*  APTT  --  122*  --    < > 83*   < > 101* 80* 78*  --   --   --   HEPARINUNFRC  --  1.06*  --   --   --   --   --   --  0.24*  --   --  0.29*  CREATININE  --   --  1.26*  --   --   --  1.22  --   --   --  1.00  --    < > = values in this interval not displayed.    Assessment: 55 yom with history of thrombocytopenia and recent R femoral DVT on Xarelto PTA, remote hx of afib presenting with R foot ischemia with early gangrenous changes to L first toe. Xarelto held, and Pharmacy consulted to dose heparin in anticipation of aortogram lower extremity runoff with possible intervention by Dr. Donzetta Matters on Monday. Last dose of Xarelto 2/25 PM per discussion with patient. Ok to start heparin now, as sufficient time has passed since last Xarelto dose.  3/2 AM update:  Heparin level just below goal No issues per RN  Goal of Therapy:  Heparin level 0.3-0.7 units/mL Monitor platelets by anticoagulation protocol: Yes   Plan:  Inc heparin to 1500 units/hr Re-check heparin level at [place], PharmD, [place] Pharmacist Phone: [phone]

## 2019-06-28 NOTE — Progress Notes (Signed)
Foley catheter removed this morning by nightshift. Patient due to void. Pt has been sleeping most of the morning and has not had anything to eat/drink. This RN encouraged pt to drink some water. Pt agreed and stated he did not want another catheter. Bladder scanned pt as it has been 6 hours since catheter was removed. Bladder scan volume 97 mL. Continued to encourage pt to drink water and notify this RN when he has voided. Will continue to monitor.  Arletta Bale, RN

## 2019-06-28 NOTE — Progress Notes (Signed)
CCMD called this RN in regards to pt in SVT at 0749. Pt asymptomatic. This RN was notified again at 1102 for pt in SVT. Rate not sustained. Current HR 98. Pt denies symptoms. Will continue to monitor.   Arletta Bale, RN

## 2019-06-28 NOTE — Progress Notes (Signed)
ANTICOAGULATION CONSULT NOTE  Pharmacy Consult for heparin Indication: VTE treatment  Heparin Dosing Weight: 100.7 kg  Labs: Recent Labs    06/26/19 0013 06/26/19 0013 06/26/19 0859 06/26/19 1622 06/27/19 0204 06/27/19 0204 06/27/19 1429 06/28/19 0214 06/28/19 1200  HGB 15.7   < >  --   --  15.3   < > 17.0 14.2  --   HCT 46.9   < >  --   --  47.5  --  50.0 43.4  --   PLT 136*  --   --   --  122*  --   --  114*  --   APTT 83*   < > 101* 80* 78*  --   --   --   --   HEPARINUNFRC  --   --   --   --  0.24*  --   --  0.29* 0.53  CREATININE  --   --  1.22  --   --   --  1.00  --   --    < > = values in this interval not displayed.    Assessment: 71 yom with history of thrombocytopenia and recent R femoral DVT on Xarelto PTA, remote hx of afib presenting with R foot ischemia with early gangrenous changes to L first toe. Xarelto held, and Pharmacy consulted to dose heparin in anticipation of aortogram lower extremity runoff with possible intervention by Dr. Donzetta Matters on Monday. Last dose of Xarelto 2/25 PM per discussion with patient. Ok to start heparin now, as sufficient time has passed since last Xarelto dose.  Heparin level therapeutic at 0.53 on 1500 units/hr  Goal of Therapy:  Heparin level 0.3-0.7 units/mL Monitor platelets by anticoagulation protocol: Yes   Plan:  Continue heparin gtt at 1500 units/hr F/u heparin level with AM labs  Bertis Ruddy, PharmD Clinical Pharmacist Please check AMION for all [place] numbers 06/28/2019 12:50 PM

## 2019-06-28 NOTE — Progress Notes (Signed)
ABI exam completed.  Preliminary results can be found under CV proc under chart review.  06/28/2019 10:13 AM  Urban Naval, K., RDMS, RVT

## 2019-06-28 NOTE — Progress Notes (Signed)
MOBILITY TEAM - Progress Note   06/28/19 1642  Mobility  Activity Ambulated to bathroom;Ambulated in hall  Level of Assistance Modified independent, requires aide device or extra time  Assistive Device Front wheel walker  Distance Ambulated (ft) 400 ft  Mobility Response Tolerated fair (tachycardia up to 167)  Bed Position  (seated in recliner)   Pre-activity: HR 95 During activity: HR 132-167 Post-activity: HR 90-126  Deferred further ambulation secondary to pt's fluctuations in HR; RN notified. Pt asymptomatic.  Mabeline Caras, PT, DPT Mobility Team Pager [phone]

## 2019-06-28 NOTE — Progress Notes (Signed)
MOBILITY TEAM - Progress Note   06/28/19 1034  Mobility  Activity Ambulated in hall  Level of Assistance Modified independent, requires aide device or extra time (assist for IV pole)  Assistive Device Front wheel walker  Distance Ambulated (ft) 450 ft  Mobility Response Tolerated well  Bed Position  (seated in recliner)   Pre-activity: HR 101, BP 106/71, SpO2 97% RA During activity: HR 124-160  Post-activity: HR 118, BP 113/86  Pt tolerated hallway ambulation well with RW to offload painful R foot. HR up to 160, pt asymptomatic; RN notified.  Mabeline Caras, PT, DPT Mobility Team Pager [phone]

## 2019-06-28 NOTE — Progress Notes (Addendum)
BLAIR, LUNDEEN ([id number]) Visit Report for 06/22/2019 Allergy List Details Patient Name: Date of Service: Jonathon [name] 06/22/2019 2:45 PM Medical Record [id number] Patient Account Number: [id number] Date of Birth/Sex: Treating RN: 04-05-1951 (69 y.o. Male) Deon Pilling Primary Care Fredderick Swanger: Jonathon [name] Other Clinician: Referring Arin Vanosdol: Treating Sumit Branham/Extender:Stone III, Shelby Dubin, KRISTEN Weeks in Treatment: 0 Allergies Active Allergies morphine Reaction: itching Allergy Notes Electronic Signature(s) Signed: 06/23/2019 6:07:18 PM By: Deon Pilling Entered By: Deon Pilling on 06/22/2019 14:43:26 -------------------------------------------------------------------------------- Arrival Information Details Patient Name: Date of Service: Jonathon [name] 06/22/2019 2:45 PM Medical Record [id number] Patient Account Number: [id number] Date of Birth/Sex: Treating RN: 06-Jul-1950 (69 y.o. Male) Deon Pilling Primary Care Sandeep Delagarza: Jonathon [name] Other Clinician: Referring Anberlyn Feimster: Treating Thamar Holik/Extender:Stone III, Shelby Dubin, KRISTEN Weeks in Treatment: 0 Visit Information Patient Arrived: Cane Arrival Time: 14:39 Accompanied By: wife Transfer Assistance: None Patient Identification Verified: Yes Secondary Verification Process Yes Completed: Patient Requires Transmission- No Based Precautions: Patient Has Alerts: Yes Patient Alerts: Patient on Blood Thinner Electronic Signature(s) Signed: 06/23/2019 6:07:18 PM By: Deon Pilling Entered By: Deon Pilling on 06/22/2019 14:42:40 -------------------------------------------------------------------------------- Clinic Level of Care Assessment Details Patient Name: Date of Service: Jonathon [name] 06/22/2019 2:45 PM Medical Record [id number] Patient Account Number: [id number] Date of Birth/Sex: Treating RN: 09-09-1950 (69 y.o. Male) Baruch Gouty Primary Care Bernetta Sutley:  Jonathon [name] Other Clinician: Referring Jesus Poplin: Treating Verena Shawgo/Extender:Stone III, Shelby Dubin, KRISTEN Weeks in Treatment: 0 Clinic Level of Care Assessment Items TOOL 2 Quantity Score []  - Use when only an EandM is performed on the INITIAL visit 0 ASSESSMENTS - Nursing Assessment / Reassessment X - General Physical Exam (combine w/ comprehensive assessment (listed just below) 1 20 when performed on new pt. evals) X - Comprehensive Assessment (HX, ROS, Risk Assessments, Wounds Hx, etc.) 1 25 ASSESSMENTS - Wound and Skin Assessment / Reassessment []  - Simple Wound Assessment / Reassessment - one wound 0 X - Complex Wound Assessment / Reassessment - multiple wounds 2 5 []  - Dermatologic / Skin Assessment (not related to wound area) 0 ASSESSMENTS - Ostomy and/or Continence Assessment and Care []  - Incontinence Assessment and Management 0 []  - Ostomy Care Assessment and Management (repouching, etc.) 0 PROCESS - Coordination of Care X - Simple Patient / Family Education for ongoing care 1 15 []  - Complex (extensive) Patient / Family Education for ongoing care 0 X - Staff obtains Programmer, systems, Records, Test Results / Process Orders 1 10 []  - Staff telephones HHA, Nursing Homes / Clarify orders / etc 0 []  - Routine Transfer to another Facility (non-emergent condition) 0 []  - Routine Hospital Admission (non-emergent condition) 0 X - New Admissions / Biomedical engineer / Ordering NPWT, Apligraf, etc. 1 15 []  - Emergency Hospital Admission (emergent condition) 0 X - Simple Discharge Coordination 1 10 []  - Complex (extensive) Discharge Coordination 0 PROCESS - Special Needs []  - Pediatric / Minor Patient Management 0 []  - Isolation Patient Management 0 []  - Hearing / Language / Visual special needs 0 []  - Assessment of Community assistance (transportation, D/C planning, etc.) 0 []  - Additional assistance / Altered mentation 0 []  - Support Surface(s) Assessment (bed, cushion,  seat, etc.) 0 INTERVENTIONS - Wound Cleansing / Measurement X - Wound Imaging (photographs - any number of wounds) 1 5 []  - Wound Tracing (instead of photographs) 0 X - Simple Wound Measurement - one wound 1 5 []  - Complex Wound Measurement - multiple wounds 0 X - Simple Wound Cleansing - one wound 1 5 []  - Complex Wound Cleansing -  multiple wounds 0 INTERVENTIONS - Wound Dressings X - Small Wound Dressing one or multiple wounds 2 10 []  - Medium Wound Dressing one or multiple wounds 0 []  - Large Wound Dressing one or multiple wounds 0 []  - Application of Medications - injection 0 INTERVENTIONS - Miscellaneous []  - External ear exam 0 []  - Specimen Collection (cultures, biopsies, blood, body fluids, etc.) 0 []  - Specimen(s) / Culture(s) sent or taken to Lab for analysis 0 []  - Patient Transfer (multiple staff / Harrel Lemon Lift / Similar devices) 0 []  - Simple Staple / Suture removal (25 or less) 0 []  - Complex Staple / Suture removal (26 or more) 0 []  - Hypo / Hyperglycemic Management (close monitor of Blood Glucose) 0 X - Ankle / Brachial Index (ABI) - do not check if billed separately 1 15 Has the patient been seen at the hospital within the last three years: Yes Total Score: 155 Level Of Care: New/Established - Level 4 Electronic Signature(s) Signed: 06/22/2019 6:08:21 PM By: Baruch Gouty RN, BSN Entered By: Baruch Gouty on 06/22/2019 15:42:08 -------------------------------------------------------------------------------- Encounter Discharge Information Details Patient Name: Date of Service: Jonathon [name] 06/22/2019 2:45 PM Medical Record [id number] Patient Account Number: [id number] Date of Birth/Sex: Treating RN: Aug 19, 1950 (69 y.o. Male) Carlene Coria Primary Care Vernell Back: Jonathon [name] Other Clinician: Referring Dshaun Reppucci: Treating Zaelyn Noack/Extender:Stone III, Shelby Dubin, KRISTEN Weeks in Treatment: 0 Encounter Discharge Information Items Discharge  Condition: Stable Ambulatory Status: [place] Discharge Destination: Home Transportation: Private Auto Accompanied By: wife Schedule Follow-up Appointment: Yes Clinical Summary of Care: Patient Declined Electronic Signature(s) Signed: 06/28/2019 9:14:02 AM By: Carlene Coria RN Entered By: Carlene Coria on 06/22/2019 16:17:48 -------------------------------------------------------------------------------- Lower Extremity Assessment Details Patient Name: Date of Service: SHAYLON, ADEN 06/22/2019 2:45 PM Medical Record [id number] Patient Account Number: [id number] Date of Birth/Sex: Treating RN: April 09, 1951 (68 y.o. Male) Deon Pilling Primary Care Qasim Diveley: Jonathon [name] Other Clinician: Referring Arrian Manson: Treating Aileena Iglesia/Extender:Stone III, Shelby Dubin, KRISTEN Weeks in Treatment: 0 Edema Assessment Assessed: [Left: No] [Right: Yes] Edema: [Left: Ye] [Right: s] Calf Left: Right: Point of Measurement: 37 cm From Medial Instep cm 35.5 cm Ankle Left: Right: Point of Measurement: 11 cm From Medial Instep cm 24 cm Vascular Assessment Pulses: Dorsalis Pedis Palpable: [Right:Yes] Doppler Audible: [Right:Yes] Posterior Tibial Palpable: [Right:Yes Yes] Notes monophasic pulses noted with doppler. unable to obtain ABIs related to pulses being audible after cuff inflated. Electronic Signature(s) Signed: 06/23/2019 6:07:18 PM By: Deon Pilling Entered By: Deon Pilling on 06/22/2019 15:07:50 -------------------------------------------------------------------------------- Multi-Disciplinary Care Plan Details Patient Name: Date of Service: JLEN, WINTLE 06/22/2019 2:45 PM Medical Record [id number] Patient Account Number: [id number] Date of Birth/Sex: Treating RN: 06-Feb-1951 (68 y.o. Male) Baruch Gouty Primary Care Sadaf Przybysz: Jonathon [name] Other Clinician: Referring Valentine Barney: Treating Suzy Kugel/Extender:Stone III, Shelby Dubin, KRISTEN Weeks in Treatment:  0 Active Inactive Electronic Signature(s) Signed: 08/05/2019 6:12:46 PM By: Baruch Gouty RN, BSN Previous Signature: 06/22/2019 6:08:21 PM Version By: Baruch Gouty RN, BSN Entered By: Baruch Gouty on 07/01/2019 15:25:10 -------------------------------------------------------------------------------- Pain Assessment Details Patient Name: Date of Service: OSCAR, HANK 06/22/2019 2:45 PM Medical Record [id number] Patient Account Number: [id number] Date of Birth/Sex: 05/12/1950 (69 y.o. Male) Treating RN: Deon Pilling Primary Care Cleda Imel: Jonathon [name] Other Clinician: Referring Rainey Kahrs: Treating Laval Cafaro/Extender:Stone III, Shelby Dubin, KRISTEN Weeks in Treatment: 0 Active Problems Location of Pain Severity and Description of Pain Patient Has Paino Yes Site Locations Pain Location: Pain in Ulcers Rate the pain. Current Pain Level: 4 Worst Pain Level: 10 Least Pain Level: 0 Tolerable Pain Level: 8 Character  of Pain Describe the Pain: Heavy, Sharp Pain Management and Medication Current Pain Management: Medication: Yes Cold Application: No Rest: Yes Massage: No Activity: No T.E.N.S.: No Heat Application: No Leg drop or elevation: No Is the Current Pain Management Adequate: Adequate How does your wound impact your activities of daily livingo Sleep: Yes Bathing: No Appetite: No Relationship With Others: No Bladder Continence: No Emotions: No Bowel Continence: No Work: No Toileting: No Drive: No Dressing: No Hobbies: No Electronic Signature(s) Signed: 06/23/2019 6:07:18 PM By: Deon Pilling Entered By: Deon Pilling on 06/22/2019 14:47:22 -------------------------------------------------------------------------------- Patient/Caregiver Education Details Patient Name: [name] 2/24/2021andnbsp2:45 Date of Service: PM Medical Record [id number] Number: Patient Account Number: [id number] Treating RN: 1950-09-30 (68 y.o. Baruch Gouty Date of Birth/Gender: Male) Other Clinician: Primary Care Treating WRIGHT, Eliseo Squires Physician: Physician/Extender: Referring Physician: Devoria Albe in Treatment: 0 Education Assessment Education Provided To: Patient Education Topics Provided Tissue Oxygenation: Handouts: Peripheral Arterial Disease and Related Ulcers Methods: Explain/Verbal, Printed Responses: Reinforcements needed, State content correctly Welcome To [facility]: Handouts: Welcome To [facility] Methods: Explain/Verbal, Printed Responses: Reinforcements needed, State content correctly Wound/Skin Impairment: Handouts: Caring for Your Ulcer, Skin Care Do's and Dont's Methods: Explain/Verbal, Printed Responses: Reinforcements needed, State content correctly Electronic Signature(s) Signed: 06/22/2019 6:08:21 PM By: Baruch Gouty RN, BSN Entered By: Baruch Gouty on 06/22/2019 15:41:17 -------------------------------------------------------------------------------- Wound Assessment Details Patient Name: Date of Service: SHERRON, MUMMERT 06/22/2019 2:45 PM Medical Record [id number] Patient Account Number: [id number] Date of Birth/Sex: Treating RN: 04-12-1951 (68 y.o. 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 Etiology: Date Acquired: 05/30/2019 Wound Open Weeks Of Treatment: 0 Status: Clustered Wound: No Comorbid Deep Vein Thrombosis, Hypertension, Type History: II Diabetes, End Stage Renal Disease, Gout, Neuropathy Photos Wound Measurements Length: (cm) 3 % Reduction Width: (cm) 2.7 % Reduction Depth: (cm) 0.1  Epitheliali Area: (cm) 6.362 Tunneling: Volume: (cm) 0.636 Underminin Wound Description Classification: Unable to visualize wound bed Wound Margin: Distinct, outline attached Exudate Amount: Medium Exudate Type: Serosanguineous Exudate Color: red, brown Wound Bed Necrotic Amount: Large (67-100%) Necrotic Quality: Eschar Foul Odor After Cleansing: No Exposed Structure Fascia Exposed: No Fat Layer (Subcutaneous Tissue) Exposed: No Tendon Exposed: No Muscle Exposed: No Joint Exposed: No Bone Exposed: No in Area: 0% in Volume: 0% zation: None No g: No Electronic Signature(s) Signed: 06/24/2019 5:01:55 PM By: Baruch Gouty RN, BSN Signed: 06/24/2019 5:12:28 PM By: Mikeal Hawthorne EMT/HBOT Previous Signature: 06/22/2019 6:08:21 PM Version By: Baruch Gouty RN, BSN Entered By: Mikeal Hawthorne on 06/24/2019 16:12:05 -------------------------------------------------------------------------------- Wound Assessment Details Patient Name: Date of Service: MASSIMILIANO, ROHLEDER 06/22/2019 2:45 PM Medical Record [id number] Patient Account Number: [id number] Date of Birth/Sex: Treating RN: 09-May-1950 (68 y.o. 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 Etiology: Date Acquired: 06/22/2019 Wound Not Healed Weeks Of Treatment: 0 Status: Clustered Wound: No Comorbid Deep Vein Thrombosis, Hypertension, Type History: II Diabetes, End Stage Renal Disease, Gout, Neuropathy Photos Wound Measurements Length: (cm) 0.8 % Red Width: (cm) 0.7 % Red Depth: (cm) 0.1  Epith Area: (cm) 0.44 Tunn Volume: (cm) 0.044 Unde Wound Description Classification:  Grade 4 Wound Margin: Flat and Intact Exudate Amount: None Present Wound Bed Granulation Amount: None Present (0%) Necrotic Amount: Large (67-100%) Necrotic Quality: Eschar Foul Odor After Cleansing: No Slough/Fibrino No Exposed Structure Fascia Exposed: No Fat Layer (Subcutaneous Tissue) Exposed: Yes Tendon Exposed: No Muscle Exposed: No Joint Exposed: No Bone Exposed: No uction in Area: 0% uction in Volume: 0% elialization: None eling: No rmining: No Treatment Notes Wound #2 (Right Toe Second) 1. Cleanse With Wound Cleanser 3. Primary Dressing Applied Calcium Alginate Ag Other primary dressing (specifiy in notes) 4. Secondary Dressing Roll Gauze Notes primary - paint with betadine , Electronic Signature(s) Signed: 07/04/2019 4:54:41 PM By: Mikeal Hawthorne EMT/HBOT Signed: 08/05/2019 6:12:46 PM By: Baruch Gouty RN, BSN Previous Signature: 06/22/2019 6:08:21 PM Version By: Baruch Gouty RN, BSN Entered By: Mikeal Hawthorne on 07/04/2019 14:54:15 -------------------------------------------------------------------------------- Vitals Details Patient Name: Date of Service: MIRON, MARXEN 06/22/2019 2:45 PM Medical Record [id number] Patient Account Number: [id number] Date of Birth/Sex: Treating RN: 01-25-1951 (68 y.o. Male) Deon Pilling Primary Care Victorya Hillman: Jonathon [name] Other Clinician: Referring Mindy Behnken: Treating Arneta Mahmood/Extender:Stone III, Shelby Dubin, KRISTEN Weeks in Treatment: 0 Vital Signs Time Taken: 14:42 Temperature (F): 98 Height (in): 72 Pulse (bpm): 79 Source: Stated Respiratory Rate (breaths/min): 20 Weight (lbs): 245 Blood Pressure (mmHg): 121/76 Source: Stated Reference Range: 80 - 120 mg / dl Body Mass Index (BMI): 33.2 Electronic Signature(s) Signed: 06/23/2019 6:07:18 PM By: Deon Pilling Entered By: Deon Pilling on 06/22/2019 14:43:13

## 2019-06-28 NOTE — Consult Note (Signed)
Cardiology Consultation:   Patient ID: Jonathon [name]; [id number]; [birth year]   Admit date: 06/24/2019 Date of Consult: 06/28/2019  Primary Care Provider: Aurea Graff, PA-C Primary Cardiologist: Evalina Field, MD New Primary Electrophysiologist:  None   Patient Profile:   Jonathon [name] is a 69 y.o. male with a hx of DM, HTN, HLD, thrombocytopenia, anemia, CKD III, hypothyroid, DVT on Xarelto, Afib, obesity, gout, anxiety, peripheral neuropathy, nl EF 2016, who is being seen today for the evaluation of atrial fib at the request of Dr Oneida Alar.  History of Present Illness:   [name] was diagnosed with RLE cellulitis and R calf DVT on 02/10. He was started on Xarelto, and admitted for IV ABX. He was discharged on 02/12 after ABX IV>>PO. D/c on treatment Xarelto at 15 mg bid for 3 weeks.   He was seen by wound care and concern for wounds not healing>>?dx PAD>>referred to Dr Oneida Alar who saw him 02/25 and recommended admission for IV heparin while Xarelto washed out>>LE angio on 03/01. Echo and ECG ordered, high suspicion for Afib>>it was.   He was admitted 02/26, he was in atrial fib w/ HR 90s-100s, K+ 2.9 and supplemented. His rate was generally controlled, he was on heparin. He was taken to the cath lab 03/01 for LE angio>>OR for RLE embolectomy. He tolerated the surgery well.   03/02, he was seen by [facility] and cards consult called to help w/ rate control and long term Afib management.   [name] has no awareness of the atrial fibrillation.  He does not routinely check his heart rate and blood pressure at home.  He has no idea how long he has been in the atrial fibrillation.  He remembers being told that he had atrial fibrillation about 10 years ago.  He was diagnosed by his PCP, and does not remember anticoagulation being mentioned.  He has no history of presyncope or syncope.  He used to have sleep apnea, but lost a great deal of weight.  His snoring is greatly  decreased, and his wife never hears him quit breathing.  His blood pressure used to be much higher, but after he lost weight, he was able to come off his blood pressure medications.  He has never had chest pain.  Until the cellulitis, he had no problems with lower extremity edema.  No orthopnea or PND.  Because of the cellulitis and Covid, he has not been as active as usual.  However, he is able to walk about 100 feet to the mailbox and back without any difficulty.  He denies dyspnea on exertion.   Past Medical History:  Diagnosis Date  . Anemia due to blood loss, acute 2014   2nd GIB  . Anxiety   . CKD (chronic kidney disease), stage III 2014  . Diabetes mellitus without complication ([place])   . Gout   . High cholesterol   . Hypertension 2014  . Hypothyroid   . Neuropathy   . PUD (peptic ulcer disease) 2014    Past Surgical History:  Procedure Laterality Date  . ABDOMINAL AORTOGRAM W/LOWER EXTREMITY Bilateral 06/27/2019   Procedure: ABDOMINAL AORTOGRAM W/LOWER EXTREMITY;  Surgeon: Elam Dutch, MD;  Location: [place];  Service: Vascular;  Laterality: Bilateral;  . ADRENALECTOMY    . EMBOLECTOMY Right 06/27/2019   Procedure: RIGHT LEG EMBOLECTOMY;  Surgeon: Elam Dutch, MD;  Location: [facility] OR;  Service: Vascular;  Laterality: Right;     Prior to Admission medications  Medication Sig Start Date End Date Taking? Authorizing Provider  ALPRAZolam (XANAX) 0.25 MG tablet Take 0.25 mg by mouth at bedtime.  04/14/19  Yes [provider]  atorvastatin (LIPITOR) 40 MG tablet Take 40 mg by mouth at bedtime.    Yes [provider]  colchicine 0.6 MG tablet Take 0.6 mg by mouth at bedtime.    Yes [provider]  doxazosin (CARDURA) 4 MG tablet Take 4 mg by mouth at bedtime.    Yes [provider]  doxycycline (VIBRAMYCIN) 100 MG capsule Take 100 mg by mouth 2 (two) times daily. 06/08/19  Yes [provider]  febuxostat (ULORIC)  40 MG tablet Take 40 mg by mouth at bedtime.  04/15/19  Yes [provider]  gemfibrozil (LOPID) 600 MG tablet Take 600 mg by mouth 2 (two) times daily before a meal.    Yes [provider]  ibuprofen (ADVIL) 200 MG tablet Take 800 mg by mouth every 8 (eight) hours as needed for moderate pain.    Yes [provider]  JARDIANCE 10 MG TABS tablet Take 10 mg by mouth at bedtime.  05/19/19  Yes [provider]  levothyroxine (SYNTHROID) 112 MCG tablet Take 112 mcg by mouth at bedtime.    Yes [provider]  metFORMIN (GLUCOPHAGE-XR) 500 MG 24 hr tablet Take 1,000 mg by mouth at bedtime.    Yes [provider]  oxybutynin (DITROPAN-XL) 10 MG 24 hr tablet Take 10 mg by mouth at bedtime.    Yes [provider]  pantoprazole (PROTONIX) 40 MG tablet Take 40 mg by mouth 2 (two) times daily.  09/17/12  Yes [provider]  PARoxetine (PAXIL) 40 MG tablet Take 40 mg by mouth at bedtime.    Yes [provider]  potassium chloride SA (KLOR-CON) 20 MEQ tablet Take 60 mEq by mouth at bedtime.  09/17/12  Yes [provider]  pregabalin (LYRICA) 75 MG capsule Take 150 mg by mouth at bedtime.  09/10/16  Yes [provider]  Rivaroxaban (XARELTO) 15 MG TABS tablet Take 1 tablet (15 mg total) by mouth 2 (two) times daily. 06/10/19  Yes Shelda Pal, DO  temazepam (RESTORIL) 15 MG capsule Take 15 mg by mouth at bedtime.  05/18/19  Yes [provider]  testosterone cypionate (DEPOTESTOTERONE CYPIONATE) 100 MG/ML injection Inject 200 mg into the muscle every 14 (fourteen) days.  02/22/19  Yes [provider]  rivaroxaban (XARELTO) 20 MG TABS tablet Take 1 tablet (20 mg total) by mouth daily. 06/30/19   Shelda Pal, DO    Inpatient Medications: Scheduled Meds: . ALPRAZolam  0.25 mg Oral QHS  . aspirin EC  81 mg Oral Daily  . atorvastatin  40 mg Oral QHS  . colchicine  0.6 mg Oral QHS  .  doxazosin  4 mg Oral QHS  . doxycycline  100 mg Oral BID  . febuxostat  40 mg Oral QHS  . gemfibrozil  600 mg Oral BID AC  . insulin aspart  0-15 Units Subcutaneous TID WC  . insulin aspart  0-5 Units Subcutaneous QHS  . insulin aspart  4 Units Subcutaneous TID WC  . levothyroxine  112 mcg Oral QHS  . metoprolol tartrate  5 mg Intravenous Q6H  . oxybutynin  10 mg Oral QHS  . pantoprazole  40 mg Oral BID  . PARoxetine  40 mg Oral QHS  . pregabalin  150 mg Oral QHS  . sodium chloride flush  3 mL Intravenous Q12H  . temazepam  15 mg Oral QHS  . [START ON 07/01/2019] testosterone cypionate  200 mg Intramuscular Q14 Days   Continuous Infusions: . sodium chloride    . heparin 1,500 Units/hr (06/28/19 1044)  . lactated ringers     PRN Meds: sodium chloride, acetaminophen, alum & mag hydroxide-simeth, guaiFENesin-dextromethorphan, hydrALAZINE, HYDROmorphone (DILAUDID) injection, ibuprofen, labetalol, metoprolol tartrate, ondansetron (ZOFRAN) IV, oxyCODONE, phenol, sodium chloride flush, zolpidem  Allergies:    Allergies  Allergen Reactions  . Morphine And Related Itching    Social History:   Social History   Socioeconomic History  . Marital status: Married    Spouse name: Not on file  . Number of children: Not on file  . Years of education: Not on file  . Highest education level: Not on file  Occupational History  . Not on file  Tobacco Use  . Smoking status: Former Research scientist (life sciences)  . Smokeless tobacco: Never Used  Substance and Sexual Activity  . Alcohol use: Yes    Comment: daily  . Drug use: Never  . Sexual activity: Not on file  Other Topics Concern  . Not on file  Social History Narrative  . Not on file   Social Determinants of Health   Financial Resource Strain:   . Difficulty of Paying Living Expenses: Not on file  Food Insecurity:   . Worried About Charity fundraiser in the Last Year: Not on file  . Ran Out of Food in the Last Year: Not on file  Transportation  Needs:   . Lack of Transportation (Medical): Not on file  . Lack of Transportation (Non-Medical): Not on file  Physical Activity:   . Days of Exercise per Week: Not on file  . Minutes of Exercise per Session: Not on file  Stress:   . Feeling of Stress : Not on file  Social Connections:   . Frequency of Communication with Friends and Family: Not on file  . Frequency of Social Gatherings with Friends and Family: Not on file  . Attends Religious Services: Not on file  . Active Member of Clubs or Organizations: Not on file  . Attends Archivist Meetings: Not on file  . Marital Status: Not on file  Intimate Partner Violence:   . Fear of Current or Ex-Partner: Not on file  . Emotionally Abused: Not on file  . Physically Abused: Not on file  . Sexually Abused: Not on file    Family History:   Family History  Problem Relation Age of Onset  . Diabetes Mellitus II Maternal Aunt    Family Status:  Family Status  Relation Name Status  . Mat Aunt  Deceased  . Mother  Deceased  . Father  Deceased    ROS:  Please see the history of present illness.  All other ROS reviewed and negative.     Physical Exam/Data:   Vitals:   06/28/19 0410 06/28/19 0500 06/28/19 0835 06/28/19 1139  BP: (!) 98/56  98/76 107/76  Pulse: 92  87   Resp: 17 16 20 14   Temp: 98 F (36.7 C)  98.2 F (36.8 C) 97.6 F (36.4 C)  TempSrc: Oral  Axillary Oral  SpO2: 97% 95% 98% 98%  Weight:      Height:        Intake/Output Summary (Last 24 hours) at 06/28/2019 1454 Last data filed at 06/28/2019 0657 Gross per 24 hour  Intake 1502.9 ml  Output 1885 ml  Net -  382.1 ml    Last 3 Weights 06/24/2019 06/23/2019 06/08/2019  Weight (lbs) 241 lb 252 lb 252 lb  Weight (kg) 109.317 kg 114.306 kg 114.306 kg     Body mass index is 32.69 kg/m.   General:  Well nourished, well developed, male in no acute distress HEENT: normal Lymph: no adenopathy Neck: JVD -not elevated Endocrine:  No  thryomegaly Vascular: No carotid bruits; upper extremity pulses 2+  Cardiac:  normal S1, S2; irregular rate and rhythm; no murmur Lungs:  clear bilaterally, no wheezing, rhonchi or rales  Abd: soft, nontender, no hepatomegaly  Ext: Trace right edema, none on the left Musculoskeletal:  No deformities, BUE and BLE strength normal and equal Skin: warm and dry; incision on the right lower extremity is healing well Neuro:  CNs 2-12 intact, no focal abnormalities noted Psych:  Normal affect   EKG:  The EKG was personally reviewed and demonstrates: 06/27/2019 ECG is atrial fibrillation, heart rate 95, no acute ischemic changes Telemetry:  Telemetry was personally reviewed and demonstrates: Atrial fibrillation, heart rate elevated at times (especially just after surgery)   CV studies:   ECHO: 06/25/2019 1. Left ventricular ejection fraction, by estimation, is 35 to 40%. The  left ventricle has moderately decreased function. The left ventricle  demonstrates global hypokinesis. Unable to assess LV diastolic filling due  ot underlying atrial fibrillation.  2. Right ventricular systolic function is normal. The right ventricular  size is normal. There is normal pulmonary artery systolic pressure.  3. Left atrial size was mildly dilated.  4. The mitral valve is normal in structure and function. Trivial mitral  valve regurgitation. No evidence of mitral stenosis.  5. The aortic valve is tricuspid. Aortic valve regurgitation is not  visualized. Mild aortic valve sclerosis is present, with no evidence of  aortic valve stenosis.  6. The inferior vena cava is dilated in size with >50% respiratory  variability, suggesting right atrial pressure of 8 mmHg.   ABI's: 06/28/2019 Right: Resting right ankle-brachial index is within normal range. No  evidence of significant right lower extremity arterial disease. Absent  waveform in right great toe.   Left: Resting left ankle-brachial index is within  normal range. No  evidence of significant left lower extremity arterial disease. The left  toe-brachial index is normal.   LE ANGIO: 06/27/2019 Operative findings: Occlusion right superficial femoral popliteal posterior tibial peroneal arteries with what appears to be embolic material one-vessel AT runoff  Operative details: After team informed consent, patient taken the [place] lab.  The patient placed in supine position angio table.  Both groins were prepped and draped usual sterile fashion.  Local anesthesia was infiltrated of the left common femoral artery.  Ultrasound was used to identify left common femoral artery and femoral bifurcation.  An introducer needle was used to cannulate the left common femoral artery and 035 Bentson wire advanced up the abdominal aorta under fluoroscopic guidance.  A 5 French sheath placed over the guidewire in the left common femoral artery.  This was thoroughly flushed with heparinized saline.  5 French pigtail catheter was advanced over the guidewire and abdominal aortogram was obtained AP projection.  Left and right renal arteries are patent.  Infrarenal abdominal aorta is patent.  Left and right common external and internal iliac arteries are all widely patent.  Next pigtail catheter was pulled down just above the aortic bifurcation pelvic angiogram was obtained to confirm the above findings.  At this point the pigtail catheter was removed over  guidewire and exchanged for a 5 [place] crossover catheter.  This was used to selectively catheterize the right common iliac and then the guidewire advanced down into the distal right external iliac artery.  Crossover catheter was swapped out for a 5 [place] straight catheter.  Right lower extremity arteriogram was then obtained.  The right common femoral profunda femoris are all patent.  The right superficial femoral artery occludes about 5 cm after its origin with a meniscus suggestive of embolic material.  The entire SFA is  occluded.  There is a small island of contrast opacification in the popliteal artery.  The remainder of the popliteal artery is occluded and is very irregular in its surface in the below-knee segment.  The contrast does partially opacified the peroneal and posterior tibial arteries but they appear to have embolic material within them as well.  The anterior tibial artery is patent throughout its course.  At this point 5 French straight catheter was removed and left lower extremity arteriogram was obtained through the sheath.  The left common femoral profundofemoral and superficial femoral arteries are all patent.  Left popliteal artery is patent.  Left anterior tibial artery is the dominant runoff vessel to the left foot the peroneal and posterior tibial arteries are patent but are smaller in caliber.  At this point the procedure was concluded.  The patient tolerated procedure well and there were complications.  Patient was taken to the operating room for right leg embolectomy today.  R pop, SFA and tibial embolectomy : 06/28/2019 Preoperative diagnosis: Ischemia right foot Operative findings: #1 large amount of embolic material sent to pathology as a specimen  2.  Biphasic posterior tibial and dorsalis pedis Doppler signal at conclusion of case  Operative details: After obtaining form consent, the patient was taken the operating.  The patient was placed in supine position operating table.  After induction of general anesthesia and endotracheal intubation patient's right lower extremities prepped and draped in usual sterile fashion.  Next a longitudinal incision was made on the medial aspect of the right leg just below the knee.  Incision was carried down through subtenons tissues down the level of the saphenous vein.  Side branches on saphenous vein were ligated and divided tween silk ties as this was reflected towards the anterior aspect of the leg.  The incision was then deepened into the popliteal  space.  Below-knee popliteal artery was dissected free circumferentially.  It was soft on palpation but thickened.  Dissection was carried down to the origins of the anterior tibial and peroneal trunk.  These were all dissected free circumferentially and Vesseloops placed around these.  Several soleal muscle fibers and vein branches were taken down with cautery.  Patient was given 10,000 units of intravenous heparin.  After 2 minutes circulation time, a transverse arteriotomy was made in the popliteal artery just above the takeoff of the tibial vessels.  #4 Fogarty catheter was used to thrombectomize the proximal aspect of the artery.  Multiple passes were made retrieving a large amount of embolic material and there was brisk arterial inflow.  2 clean passes were obtained.  This was then controlled with a vessel loop.  I then subsequently passed the 3 Fogarty catheter down the anterior tibial artery multiple times until all embolic material was removed and there was brisk backbleeding from this as well.  Finally the 3 Fogarty catheter and a 4 Fogarty catheter was passed down the tibioperoneal trunk multiple passes until no further embolic material was retrieved.  The arteriotomy was then repaired using interrupted 7-0 Prolene sutures.  At completion there was good hemostasis and flow was restored to the tibial vessels.  There was good popliteal pulse.  There was biphasic posterior tibial and dorsalis pedis Doppler signal.  Hemostasis was obtained with direct pressure.  10 flat Jackson-Pratt drain was placed into the popliteal space and brought out through separate stab incision and sutured to the skin with nylon stitch.  Subcutaneous tissues were reapproximated using running 3-0 Vicryl suture.  Skin was closed with 4-0 Vicryl subcuticular stitch.  Dermabond was applied.  The patient tired procedure well and there were no complications.  The instrument sponge needle counts correct in the case.  The patient was taken  the recovery in stable condition.   Laboratory Data:   Chemistry Recent Labs  Lab 06/24/19 2221 06/24/19 2221 06/25/19 0844 06/26/19 0859 06/27/19 1429  NA 139   < > 140 139 138  K 2.9*   < > 3.3* 3.5 3.7  CL 102   < > 105 108 108  CO2 22  --  23 19*  --   GLUCOSE 128*   < > 111* 100* 87  BUN 19   < > 17 17 16   CREATININE 1.47*   < > 1.26* 1.22 1.00  CALCIUM 9.0  --  8.6* 7.9*  --   GFRNONAA 48*  --  58* >60  --   GFRAA 56*  --  >60 >60  --   ANIONGAP 15  --  12 12  --    < > = values in this interval not displayed.    Lab Results  Component Value Date   ALT 16 06/24/2019   AST 18 06/24/2019   ALKPHOS 51 06/24/2019   BILITOT 0.9 06/24/2019   Hematology Recent Labs  Lab 06/26/19 0013 06/26/19 0013 06/27/19 0204 06/27/19 1429 06/28/19 0214  WBC 6.1  --  5.7  --  7.5  RBC 4.86  --  4.72  --  4.44  HGB 15.7   < > 15.3 17.0 14.2  HCT 46.9   < > 47.5 50.0 43.4  MCV 96.5  --  100.6*  --  97.7  MCH 32.3  --  32.4  --  32.0  MCHC 33.5  --  32.2  --  32.7  RDW 12.7  --  12.9  --  12.8  PLT 136*  --  122*  --  114*   < > = values in this interval not displayed.   Cardiac Enzymes High Sensitivity Troponin:  No results for input(s): TROPONINIHS in the last 720 hours.    BNPNo results for input(s): BNP, PROBNP in the last 168 hours.  DDimer No results for input(s): DDIMER in the last 168 hours. TSH: No results found for: TSH Lipids:No results found for: CHOL, HDL, LDLCALC, LDLDIRECT, TRIG, CHOLHDL HgbA1c: Lab Results  Component Value Date   HGBA1C 6.4 (H) 06/24/2019   Magnesium: No results found for: MG   Radiology/Studies:  PERIPHERAL VASCULAR CATHETERIZATION  Result Date: 06/27/2019 Procedure: Aortogram with bilateral lower extremity runoff Preoperative diagnosis: Ischemia right foot Postoperative diagnosis: Same Anesthesia: Local Operative findings: Occlusion right superficial femoral popliteal posterior tibial peroneal arteries with what appears to be  embolic material one-vessel AT runoff Operative details: After team informed consent, patient taken the [place] lab.  The patient placed in supine position angio table.  Both groins were prepped and draped usual sterile fashion.  Local anesthesia was infiltrated of the left  common femoral artery.  Ultrasound was used to identify left common femoral artery and femoral bifurcation.  An introducer needle was used to cannulate the left common femoral artery and 035 Bentson wire advanced up the abdominal aorta under fluoroscopic guidance.  A 5 French sheath placed over the guidewire in the left common femoral artery.  This was thoroughly flushed with heparinized saline.  5 French pigtail catheter was advanced over the guidewire and abdominal aortogram was obtained AP projection.  Left and right renal arteries are patent.  Infrarenal abdominal aorta is patent.  Left and right common external and internal iliac arteries are all widely patent. Next pigtail catheter was pulled down just above the aortic bifurcation pelvic angiogram was obtained to confirm the above findings. At this point the pigtail catheter was removed over guidewire and exchanged for a 5 [place] crossover catheter.  This was used to selectively catheterize the right common iliac and then the guidewire advanced down into the distal right external iliac artery.  Crossover catheter was swapped out for a 5 [place] straight catheter.  Right lower extremity arteriogram was then obtained.  The right common femoral profunda femoris are all patent.  The right superficial femoral artery occludes about 5 cm after its origin with a meniscus suggestive of embolic material.  The entire SFA is occluded.  There is a small island of contrast opacification in the popliteal artery.  The remainder of the popliteal artery is occluded and is very irregular in its surface in the below-knee segment.  The contrast does partially opacified the peroneal and posterior tibial arteries but  they appear to have embolic material within them as well.  The anterior tibial artery is patent throughout its course. At this point 5 French straight catheter was removed and left lower extremity arteriogram was obtained through the sheath.  The left common femoral profundofemoral and superficial femoral arteries are all patent.  Left popliteal artery is patent.  Left anterior tibial artery is the dominant runoff vessel to the left foot the peroneal and posterior tibial arteries are patent but are smaller in caliber.  At this point the procedure was concluded.  The patient tolerated procedure well and there were complications.  Patient was taken to the operating room for right leg embolectomy today. [name], MD [facility] Office: [phone]  VAS [place] ABI WITH/WO TBI  Result Date: 06/28/2019 LOWER EXTREMITY DOPPLER STUDY Indications: Post op right leg embolectomy 06-27-19 with drain.  Limitations: Today's exam was limited due to A. Fib. Comparison Study: No prior exam. Performing Technologist: Baldwin Crown RDMS  Examination Guidelines: A complete evaluation includes at minimum, Doppler waveform signals and systolic blood pressure reading at the level of bilateral brachial, anterior tibial, and posterior tibial arteries, when vessel segments are accessible. Bilateral testing is considered an integral part of a complete examination. Photoelectric Plethysmograph (PPG) waveforms and toe systolic pressure readings are included as required and additional duplex testing as needed. Limited examinations for reoccurring indications may be performed as noted.  ABI Findings: +---------+------------------+-----+---------+---------+ Right    Rt Pressure (mmHg)IndexWaveform Comment   +---------+------------------+-----+---------+---------+ Brachial 111                    triphasic          +---------+------------------+-----+---------+---------+ PTA      134                1.21 triphasic          +---------+------------------+-----+---------+---------+ DP  144               1.30 triphasic          +---------+------------------+-----+---------+---------+ Great Toe                       Absent   black toe +---------+------------------+-----+---------+---------+ +---------+------------------+-----+---------+-------+ Left     Lt Pressure (mmHg)IndexWaveform Comment +---------+------------------+-----+---------+-------+ Brachial 111                    triphasic        +---------+------------------+-----+---------+-------+ PTA      143               1.29 triphasic        +---------+------------------+-----+---------+-------+ DP       140               1.26 triphasic        +---------+------------------+-----+---------+-------+ Theodoro Parma               1.02 Normal           +---------+------------------+-----+---------+-------+ +-------+-----------+-----------+------------+------------+ ABI/TBIToday's ABIToday's TBIPrevious ABIPrevious TBI +-------+-----------+-----------+------------+------------+ Right  1.3                                            +-------+-----------+-----------+------------+------------+ Left   1.29       1.02                                +-------+-----------+-----------+------------+------------+ Patient is currently in Atrial Fib.  Summary: Right: Resting right ankle-brachial index is within normal range. No evidence of significant right lower extremity arterial disease. Absent waveform in right great toe. Left: Resting left ankle-brachial index is within normal range. No evidence of significant left lower extremity arterial disease. The left toe-brachial index is normal.  *See table(s) above for measurements and observations.    Preliminary    ECHOCARDIOGRAM COMPLETE  Result Date: 06/25/2019    ECHOCARDIOGRAM REPORT   Patient Name:   PAYDEN DOCTER Date of Exam: 06/25/2019 Medical Rec #:   [id number]         Height:       72.0 in Accession #:    [id number]        Weight:       241.0 lb Date of Birth:  10-19-1950        BSA:          2.306 m Patient Age:    44 years          BP:           115/83 mmHg Patient Gender: M                 HR:           109 bpm. Exam Location:  Inpatient Procedure: 2D Echo Indications:    ischemia of lower extremity  History:        Patient has no prior history of Echocardiogram examinations.                 Chronic kidney disease, Arrythmias:Atrial Fibrillation; Risk                 Factors:Diabetes and Dyslipidemia.  Sonographer:    Johny Chess Referring Phys: [id number] [place]  1.  Left ventricular ejection fraction, by estimation, is 35 to 40%. The left ventricle has moderately decreased function. The left ventricle demonstrates global hypokinesis. Unable to assess LV diastolic filling due ot underlying atrial fibrillation.  2. Right ventricular systolic function is normal. The right ventricular size is normal. There is normal pulmonary artery systolic pressure.  3. Left atrial size was mildly dilated.  4. The mitral valve is normal in structure and function. Trivial mitral valve regurgitation. No evidence of mitral stenosis.  5. The aortic valve is tricuspid. Aortic valve regurgitation is not visualized. Mild aortic valve sclerosis is present, with no evidence of aortic valve stenosis.  6. The inferior vena cava is dilated in size with >50% respiratory variability, suggesting right atrial pressure of 8 mmHg. FINDINGS  Left Ventricle: Left ventricular ejection fraction, by estimation, is 35 to 40%. The left ventricle has moderately decreased function. The left ventricle demonstrates global hypokinesis. The left ventricular internal cavity size was normal in size. There is no left ventricular hypertrophy. Unable to assess LV diastolic filling due ot underlying atrial fibrillation. Right Ventricle: The right ventricular size is normal. No  increase in right ventricular wall thickness. Right ventricular systolic function is normal. There is normal pulmonary artery systolic pressure. The tricuspid regurgitant velocity is 2.02 m/s, and  with an assumed right atrial pressure of 8 mmHg, the estimated right ventricular systolic pressure is 97.0 mmHg. Left Atrium: Left atrial size was mildly dilated. Right Atrium: Right atrial size was normal in size. Pericardium: There is no evidence of pericardial effusion. Mitral Valve: The mitral valve is normal in structure and function. Normal mobility of the mitral valve leaflets. Moderate mitral annular calcification. Trivial mitral valve regurgitation. No evidence of mitral valve stenosis. Tricuspid Valve: The tricuspid valve is normal in structure. Tricuspid valve regurgitation is trivial. No evidence of tricuspid stenosis. Aortic Valve: The aortic valve is tricuspid. Aortic valve regurgitation is not visualized. Mild aortic valve sclerosis is present, with no evidence of aortic valve stenosis. Pulmonic Valve: The pulmonic valve was normal in structure. Pulmonic valve regurgitation is trivial. No evidence of pulmonic stenosis. Aorta: The aortic root is normal in size and structure. Venous: The inferior vena cava is dilated in size with greater than 50% respiratory variability, suggesting right atrial pressure of 8 mmHg. IAS/Shunts: No atrial level shunt detected by color flow Doppler.  LEFT VENTRICLE PLAX 2D LVIDd:         5.20 cm LVIDs:         4.30 cm LV PW:         1.30 cm LV IVS:        1.10 cm LVOT diam:     2.30 cm LVOT Area:     4.15 cm  RIGHT VENTRICLE RV S prime:     9.14 cm/s LEFT ATRIUM             Index       RIGHT ATRIUM           Index LA diam:        4.80 cm 2.08 cm/m  RA Area:     16.10 cm LA Vol (A2C):   70.0 ml 30.35 ml/m RA Volume:   39.40 ml  17.08 ml/m LA Vol (A4C):   83.3 ml 36.12 ml/m LA Biplane Vol: 77.5 ml 33.60 ml/m   AORTA Ao Root diam: 3.30 cm Ao Asc diam:  3.70 cm TRICUSPID VALVE  TR Peak grad:   16.3 mmHg TR Vmax:  202.00 cm/s  SHUNTS Systemic Diam: 2.30 cm Fransico Him MD Electronically signed by Fransico Him MD Signature Date/Time: 06/25/2019/6:00:26 PM    Final     Assessment and Plan:   1. Persistent Atrial fib - rate control currently is Lopressor 5 mg IV q 6 hr - L atrium mildly dilated, R atrium nl size - continue rate control, change to po rx  2. Cardiomyopathy: - EF 35-40% w/ global HK - continue BB, change to po - BP on the low side, can try losartan 12.5 mg qd and see how tolerated - BP too low for Entresto right now - no hx volume issues, make sure he gets instruction on a low Na+DM diet - at some point, may need ischemic eval, but with no WMA, will allow recovery from surgery with med rx for now.  3. RLE emboli - surgery required to remove R-SFA, R pop and R tibial emboli - ABI  0.43>>1.3 post-op - per [facility], need to get info from them on when we can start po anticoag  4. Chronic anticoagulation: - Pt started on Xarelto 15 mg bid on 02/10 - his wife accidentally gave him 15 mg bid and 20 mg bid for about a week (no bleeding problems), unclear if Xarelto was taken w/ meals or not. - however, do not believe he had the arterial emboli at the time he was put on Xarelto, that means he developed it afterwards>>failed Xarelto - if he failed Xarelto, cannot use another Xa, need to go to Pradaxa    Active Problems:   PAD (peripheral artery disease) ([place])     For questions or updates, please contact [place] Please consult [email or web address] for contact info under Cardiology/STEMI.   Jonetta Speak, PA-C  06/28/2019 2:54 PM

## 2019-06-28 NOTE — Anesthesia Postprocedure Evaluation (Signed)
Anesthesia Post Note  Patient: Jonathon [name]  Procedure(s) Performed: RIGHT LEG EMBOLECTOMY (Right )     Patient location during evaluation: PACU Anesthesia Type: General Level of consciousness: awake and alert Pain management: pain level controlled Vital Signs Assessment: post-procedure vital signs reviewed and stable Respiratory status: spontaneous breathing, nonlabored ventilation and respiratory function stable Cardiovascular status: blood pressure returned to baseline and stable Postop Assessment: no apparent nausea or vomiting Anesthetic complications: no    Last Vitals:  Vitals:   06/28/19 0835 06/28/19 1139  BP: 98/76 107/76  Pulse: 87   Resp: 20 14  Temp: 36.8 C 36.4 C  SpO2: 98% 98%    Last Pain:  Vitals:   06/28/19 1139  TempSrc: Oral  PainSc:                  Catalina Gravel

## 2019-06-29 ENCOUNTER — Encounter: Payer: Self-pay | Admitting: Hematology

## 2019-06-29 ENCOUNTER — Inpatient Hospital Stay: Payer: Medicare Other | Admitting: Hematology

## 2019-06-29 ENCOUNTER — Telehealth: Payer: Self-pay | Admitting: Hematology

## 2019-06-29 LAB — CBC
HCT: 41.2 % (ref 39.0–52.0)
Hemoglobin: 13.8 g/dL (ref 13.0–17.0)
MCH: 32 pg (ref 26.0–34.0)
MCHC: 33.5 g/dL (ref 30.0–36.0)
MCV: 95.6 fL (ref 80.0–100.0)
Platelets: 127 10*3/uL — ABNORMAL LOW (ref 150–400)
RBC: 4.31 MIL/uL (ref 4.22–5.81)
RDW: 12.6 % (ref 11.5–15.5)
WBC: 6.6 10*3/uL (ref 4.0–10.5)
nRBC: 0 % (ref 0.0–0.2)

## 2019-06-29 LAB — HEPARIN LEVEL (UNFRACTIONATED): Heparin Unfractionated: 0.51 IU/mL (ref 0.30–0.70)

## 2019-06-29 LAB — GLUCOSE, CAPILLARY
Glucose-Capillary: 103 mg/dL — ABNORMAL HIGH (ref 70–99)
Glucose-Capillary: 94 mg/dL (ref 70–99)
Glucose-Capillary: 91 mg/dL (ref 70–99)
Glucose-Capillary: 127 mg/dL — ABNORMAL HIGH (ref 70–99)

## 2019-06-29 LAB — SURGICAL PATHOLOGY

## 2019-06-29 MED ORDER — RIVAROXABAN 20 MG PO TABS
20.0000 mg | ORAL_TABLET | Freq: Every day | ORAL | Status: DC
  Administered 2019-06-29 – 2019-06-30 (×2): 20 mg via ORAL
  Filled 2019-06-29 (×2): qty 1

## 2019-06-29 MED ORDER — DABIGATRAN ETEXILATE MESYLATE 150 MG PO CAPS
150.0000 mg | ORAL_CAPSULE | Freq: Two times a day (BID) | ORAL | Status: DC
  Filled 2019-06-29: qty 1

## 2019-06-29 NOTE — Progress Notes (Signed)
Cardiology Progress Note  Patient ID: Jonathon [name] MRN: [id number] DOB: 07-02-1950 Date of Encounter: 06/29/2019  Primary Cardiologist: Evalina Field, MD  Subjective  Doing well this morning.  No complaints.  Reports he has no shortness of breath or palpitations.  Denies chest pain.  ROS:  All other ROS reviewed and negative. Pertinent positives noted in the HPI.     Inpatient Medications  Scheduled Meds: . ALPRAZolam  0.25 mg Oral QHS  . aspirin EC  81 mg Oral Daily  . atorvastatin  40 mg Oral QHS  . colchicine  0.6 mg Oral QHS  . doxazosin  4 mg Oral QHS  . doxycycline  100 mg Oral BID  . febuxostat  40 mg Oral QHS  . gemfibrozil  600 mg Oral BID AC  . insulin aspart  0-15 Units Subcutaneous TID WC  . insulin aspart  0-5 Units Subcutaneous QHS  . insulin aspart  4 Units Subcutaneous TID WC  . levothyroxine  112 mcg Oral QHS  . losartan  12.5 mg Oral Daily  . metoprolol succinate  50 mg Oral Daily  . oxybutynin  10 mg Oral QHS  . pantoprazole  40 mg Oral BID  . PARoxetine  40 mg Oral QHS  . pregabalin  150 mg Oral QHS  . sodium chloride flush  3 mL Intravenous Q12H  . temazepam  15 mg Oral QHS  . [START ON 07/01/2019] testosterone cypionate  200 mg Intramuscular Q14 Days   Continuous Infusions: . sodium chloride    . lactated ringers     PRN Meds: sodium chloride, acetaminophen, alum & mag hydroxide-simeth, guaiFENesin-dextromethorphan, hydrALAZINE, HYDROmorphone (DILAUDID) injection, labetalol, metoprolol tartrate, ondansetron (ZOFRAN) IV, oxyCODONE, phenol, sodium chloride flush, zolpidem   Vital Signs   Vitals:   06/28/19 2300 06/29/19 0000 06/29/19 0507 06/29/19 0841  BP:  106/62 102/71 93/63  Pulse:    88  Resp: 17 18 17 16   Temp:  98.4 F (36.9 C) 98.5 F (36.9 C) 97.7 F (36.5 C)  TempSrc:  Oral Oral Oral  SpO2:  98% 98% 97%  Weight:      Height:        Intake/Output Summary (Last 24 hours) at 06/29/2019 0939 Last data filed at 06/29/2019  0659 Gross per 24 hour  Intake 299.03 ml  Output 940 ml  Net -640.97 ml   Last 3 Weights 06/24/2019 06/23/2019 06/08/2019  Weight (lbs) 241 lb 252 lb 252 lb  Weight (kg) 109.317 kg 114.306 kg 114.306 kg      Telemetry  Overnight telemetry shows atrial fibrillation with heart rate in the 90s, which I personally reviewed.   Physical Exam   Vitals:   06/28/19 2300 06/29/19 0000 06/29/19 0507 06/29/19 0841  BP:  106/62 102/71 93/63  Pulse:    88  Resp: 17 18 17 16   Temp:  98.4 F (36.9 C) 98.5 F (36.9 C) 97.7 F (36.5 C)  TempSrc:  Oral Oral Oral  SpO2:  98% 98% 97%  Weight:      Height:         Intake/Output Summary (Last 24 hours) at 06/29/2019 0939 Last data filed at 06/29/2019 0659 Gross per 24 hour  Intake 299.03 ml  Output 940 ml  Net -640.97 ml    Last 3 Weights 06/24/2019 06/23/2019 06/08/2019  Weight (lbs) 241 lb 252 lb 252 lb  Weight (kg) 109.317 kg 114.306 kg 114.306 kg    Body mass index is 32.69 kg/m.  General: Well nourished,  well developed, in no acute distress Head: Atraumatic, normal size  Eyes: PEERLA, EOMI  Neck: Supple, no JVD Endocrine: No thryomegaly Cardiac: Irregular rhythm, no murmurs rubs or gallops Lungs: Clear to auscultation bilaterally, no wheezing, rhonchi or rales  Abd: Soft, nontender, no hepatomegaly  Ext: Right lower extremity is red and discoloration is noted in the right great toe nonpalpable pulses Musculoskeletal: No deformities, BUE and BLE strength normal and equal Skin: Warm and dry, no rashes   Neuro: Alert and oriented to person, place, time, and situation, CNII-XII grossly intact, no focal deficits  Psych: Normal mood and affect   Labs  High Sensitivity Troponin:  No results for input(s): TROPONINIHS in the last 720 hours.   Cardiac EnzymesNo results for input(s): TROPONINI in the last 168 hours. No results for input(s): TROPIPOC in the last 168 hours.  Chemistry Recent Labs  Lab 06/24/19 2221 06/24/19 2221 06/25/19 0844  06/26/19 0859 06/27/19 1429  NA 139   < > 140 139 138  K 2.9*   < > 3.3* 3.5 3.7  CL 102   < > 105 108 108  CO2 22  --  23 19*  --   GLUCOSE 128*   < > 111* 100* 87  BUN 19   < > 17 17 16   CREATININE 1.47*   < > 1.26* 1.22 1.00  CALCIUM 9.0  --  8.6* 7.9*  --   PROT 6.6  --   --   --   --   ALBUMIN 3.1*  --   --   --   --   AST 18  --   --   --   --   ALT 16  --   --   --   --   ALKPHOS 51  --   --   --   --   BILITOT 0.9  --   --   --   --   GFRNONAA 48*  --  58* >60  --   GFRAA 56*  --  >60 >60  --   ANIONGAP 15  --  12 12  --    < > = values in this interval not displayed.    Hematology Recent Labs  Lab 06/27/19 0204 06/27/19 0204 06/27/19 1429 06/28/19 0214 06/29/19 0300  WBC 5.7  --   --  7.5 6.6  RBC 4.72  --   --  4.44 4.31  HGB 15.3   < > 17.0 14.2 13.8  HCT 47.5   < > 50.0 43.4 41.2  MCV 100.6*  --   --  97.7 95.6  MCH 32.4  --   --  32.0 32.0  MCHC 32.2  --   --  32.7 33.5  RDW 12.9  --   --  12.8 12.6  PLT 122*  --   --  114* 127*   < > = values in this interval not displayed.   BNPNo results for input(s): BNP, PROBNP in the last 168 hours.  DDimer No results for input(s): DDIMER in the last 168 hours.   Radiology  PERIPHERAL VASCULAR CATHETERIZATION  Result Date: 06/27/2019 Procedure: Aortogram with bilateral lower extremity runoff Preoperative diagnosis: Ischemia right foot Postoperative diagnosis: Same Anesthesia: Local Operative findings: Occlusion right superficial femoral popliteal posterior tibial peroneal arteries with what appears to be embolic material one-vessel AT runoff Operative details: After team informed consent, patient taken the [facility] lab.  The patient placed in supine position angio table.  Both  groins were prepped and draped usual sterile fashion.  Local anesthesia was infiltrated of the left common femoral artery.  Ultrasound was used to identify left common femoral artery and femoral bifurcation.  An introducer needle was used to cannulate  the left common femoral artery and 035 Bentson wire advanced up the abdominal aorta under fluoroscopic guidance.  A 5 French sheath placed over the guidewire in the left common femoral artery.  This was thoroughly flushed with heparinized saline.  5 French pigtail catheter was advanced over the guidewire and abdominal aortogram was obtained AP projection.  Left and right renal arteries are patent.  Infrarenal abdominal aorta is patent.  Left and right common external and internal iliac arteries are all widely patent. Next pigtail catheter was pulled down just above the aortic bifurcation pelvic angiogram was obtained to confirm the above findings. At this point the pigtail catheter was removed over guidewire and exchanged for a 5 [place] crossover catheter.  This was used to selectively catheterize the right common iliac and then the guidewire advanced down into the distal right external iliac artery.  Crossover catheter was swapped out for a 5 [place] straight catheter.  Right lower extremity arteriogram was then obtained.  The right common femoral profunda femoris are all patent.  The right superficial femoral artery occludes about 5 cm after its origin with a meniscus suggestive of embolic material.  The entire SFA is occluded.  There is a small island of contrast opacification in the popliteal artery.  The remainder of the popliteal artery is occluded and is very irregular in its surface in the below-knee segment.  The contrast does partially opacified the peroneal and posterior tibial arteries but they appear to have embolic material within them as well.  The anterior tibial artery is patent throughout its course. At this point 5 French straight catheter was removed and left lower extremity arteriogram was obtained through the sheath.  The left common femoral profundofemoral and superficial femoral arteries are all patent.  Left popliteal artery is patent.  Left anterior tibial artery is the dominant runoff  vessel to the left foot the peroneal and posterior tibial arteries are patent but are smaller in caliber.  At this point the procedure was concluded.  The patient tolerated procedure well and there were complications.  Patient was taken to the operating room for right leg embolectomy today. [name], MD [facility] Office: [phone]  VAS [place] ABI WITH/WO TBI  Result Date: 06/28/2019 LOWER EXTREMITY DOPPLER STUDY Indications: Post op right leg embolectomy 06-27-19 with drain.  Limitations: Today's exam was limited due to A. Fib. Comparison Study: No prior exam. Performing Technologist: Baldwin Crown RDMS  Examination Guidelines: A complete evaluation includes at minimum, Doppler waveform signals and systolic blood pressure reading at the level of bilateral brachial, anterior tibial, and posterior tibial arteries, when vessel segments are accessible. Bilateral testing is considered an integral part of a complete examination. Photoelectric Plethysmograph (PPG) waveforms and toe systolic pressure readings are included as required and additional duplex testing as needed. Limited examinations for reoccurring indications may be performed as noted.  ABI Findings: +---------+------------------+-----+---------+---------+ Right    Rt Pressure (mmHg)IndexWaveform Comment   +---------+------------------+-----+---------+---------+ Brachial 111                    triphasic          +---------+------------------+-----+---------+---------+ PTA      134  1.21 triphasic          +---------+------------------+-----+---------+---------+ DP       144               1.30 triphasic          +---------+------------------+-----+---------+---------+ Great Toe                       Absent   black toe +---------+------------------+-----+---------+---------+ +---------+------------------+-----+---------+-------+ Left     Lt Pressure (mmHg)IndexWaveform  Comment +---------+------------------+-----+---------+-------+ Brachial 111                    triphasic        +---------+------------------+-----+---------+-------+ PTA      143               1.29 triphasic        +---------+------------------+-----+---------+-------+ DP       140               1.26 triphasic        +---------+------------------+-----+---------+-------+ Theodoro Parma               1.02 Normal           +---------+------------------+-----+---------+-------+ +-------+-----------+-----------+------------+------------+ ABI/TBIToday's ABIToday's TBIPrevious ABIPrevious TBI +-------+-----------+-----------+------------+------------+ Right  1.3                                            +-------+-----------+-----------+------------+------------+ Left   1.29       1.02                                +-------+-----------+-----------+------------+------------+ Patient is currently in Atrial Fib.  Summary: Right: Resting right ankle-brachial index is within normal range. No evidence of significant right lower extremity arterial disease. Absent waveform in right great toe. Left: Resting left ankle-brachial index is within normal range. No evidence of significant left lower extremity arterial disease. The left toe-brachial index is normal.  *See table(s) above for measurements and observations.  Electronically signed by Deitra Mayo MD on 06/28/2019 at 4:44:56 PM.   Final     Cardiac Studies  ECHO: 06/25/2019 1. Left ventricular ejection fraction, by estimation, is 35 to 40%. The  left ventricle has moderately decreased function. The left ventricle  demonstrates global hypokinesis. Unable to assess LV diastolic filling due  ot underlying atrial fibrillation.  2. Right ventricular systolic function is normal. The right ventricular  size is normal. There is normal pulmonary artery systolic pressure.  3. Left atrial size was mildly dilated.  4. The  mitral valve is normal in structure and function. Trivial mitral  valve regurgitation. No evidence of mitral stenosis.  5. The aortic valve is tricuspid. Aortic valve regurgitation is not  visualized. Mild aortic valve sclerosis is present, with no evidence of  aortic valve stenosis.  6. The inferior vena cava is dilated in size with >50% respiratory  variability, suggesting right atrial pressure of 8 mmHg.   ABI's: 06/28/2019 Right: Resting right ankle-brachial index is within normal range. No  evidence of significant right lower extremity arterial disease. Absent  waveform in right great toe.   Left: Resting left ankle-brachial index is within normal range. No  evidence of significant left lower extremity arterial disease. The left  toe-brachial index is normal.    Patient Profile  Mr.  Risdon is a 69 year old male with medical history of diabetes, hypertension, hyperlipidemia, thrombocytopenia, anemia, CKD, atrial fibrillation (unknown duration) who was admitted on 06/24/2019 for lower extremity arterial thromboembolic event.  He is status post thrombectomy with vascular surgery.  Assessment & Plan   1.  Right lower extremity arterial thromboembolic event in the setting of atrial fibrillation -After discussion with vascular surgery, they feel that he likely had this arterial thromboembolic event in early February when he was diagnosed with a lower extremity DVT.  Therefore, we will not consider this Xarelto failure.  We will continue with Xarelto for now.  He will definitely need to be anticoagulated for his atrial fibrillation moving forward.  This will be indefinite.  I will discuss with him the possibility of TEE as an outpatient but at this point given that he will be on indefinite anticoagulation this may be of little utility.  2.  Persistent atrial fibrillation -We will continue metoprolol succinate 50 mg daily for rate control.  Rate seem to be okay on my review of  telemetry. -Xarelto indefinitely as above -I assume he is had atrial fibrillation for years.  Given that he may undergo amputation of the right lower extremity, we will continue with rate control for now.  We would not want to attempt cardioversion that that would require no interruption of anticoagulation.  I also think he will be unlikely to maintain sinus rhythm.  Given that he has a lack of symptoms and a longstanding history of atrial fibrillation, would like we will discontinue rate control.  We can discuss this further in the outpatient setting.  3.  Subclinical LV dysfunction -Echo shows EF around 35-40%.  But on my review it appears to be around 45-50%. -He will continue metoprolol succinate as above -We have added low-dose losartan which he will go home with. -No need for Lasix at this time.  Please notify us when he will be discharged.  We would like to arrange follow-up in our clinic upon discharge.  For questions or updates, please contact [place] Please consult [email or web address] for contact info under        Signed, [place] T. Audie Box, [place]  06/29/2019 9:39 AM

## 2019-06-29 NOTE — Discharge Instructions (Addendum)
Information on my medicine - XARELTO (Rivaroxaban)  Why was Xarelto prescribed for you? Xarelto was prescribed for you to reduce the risk of a blood clot forming that can cause a stroke if you have a medical condition called atrial fibrillation (a type of irregular heartbeat).  Also prescribed to treat blood clots that may have been found in the veins of your legs (deep vein thrombosis) and to reduce the risk of them occurring again.  What do you need to know about xarelto ? Take your Xarelto ONCE DAILY at the same time every day with your evening meal. If you have difficulty swallowing the tablet whole, you may crush it and mix in applesauce just prior to taking your dose.  Take Xarelto exactly as prescribed by your doctor and DO NOT stop taking Xarelto without talking to the doctor who prescribed the medication.  Stopping without other stroke prevention medication to take the place of Xarelto may increase your risk of developing a clot that causes a stroke.  Refill your prescription before you run out.  After discharge, you should have regular check-up appointments with your healthcare provider that is prescribing your Xarelto.  In the future your dose may need to be changed if your kidney function or weight changes by a significant amount.  What do you do if you miss a dose? If you are taking Xarelto ONCE DAILY and you miss a dose, take it as soon as you remember on the same day then continue your regularly scheduled once daily regimen the next day. Do not take two doses of Xarelto at the same time or on the same day.   Important Safety Information A possible side effect of Xarelto is bleeding. You should call your healthcare provider right away if you experience any of the following: ? Bleeding from an injury or your nose that does not stop. ? Unusual colored urine (red or dark brown) or unusual colored stools (red or black). ? Unusual bruising for unknown reasons. ? A serious  fall or if you hit your head (even if there is no bleeding).  Some medicines may interact with Xarelto and might increase your risk of bleeding while on Xarelto. To help avoid this, consult your healthcare provider or pharmacist prior to using any new prescription or non-prescription medications, including herbals, vitamins, non-steroidal anti-inflammatory drugs (NSAIDs) and supplements.  This website has more information on Xarelto: [email or web address]

## 2019-06-29 NOTE — Telephone Encounter (Signed)
Pt has been rescheduled to see Dr. Irene Limbo on 3/31 at 1pm. Pt is currently in the hospital. Letter mailed.

## 2019-06-29 NOTE — Progress Notes (Addendum)
[facility]  Subjective  - He is have pain and pressure in the right GT.   Objective 102/71 87 98.5 F (36.9 C) (Oral) 17 98%  Intake/Output Summary (Last 24 hours) at 06/29/2019 0726 Last data filed at 06/29/2019 0659 Gross per 24 hour  Intake 299.03 ml  Output 940 ml  Net -640.97 ml    Right GT medial ischemic changes, 2 x 2 between toes.    Doppler signals PT/DP/Peroneal right LE, JP OP 80 cc Right BK popliteal incision healing well Lungs non labored breathing  Assessment/Planning: POD # 2 Right BKP thrombectomy  Cardiology recommendations  they think we should consider switching him to Pradaxa if we presume he has failed Xarelto therapy and  continue with rate control for now.   It may be prudent to pursue a transesophageal echocardiogram to exclude any PFO given his recent DVT and now arterial embolic event.  This may of little utility as he will be on lifelong anticoagulation but we can consider this as an outpatient.  Pradaxa started today 150 mg BID.  Will maintain the JP drain OP 80 cc total last 24 hours will d/c when OP < 30 cc in 24 hours.    Jonathon [name] 06/29/2019 7:26 AM --   History reexamined and discussed with pt.  He was not really a xarelto failure.  He had pain in his foot and redness.  He went to the ER and was found to have a DVT and placed on xarelto at that time.  Most likely he had already had an afib embolic event at that time which was the source of his foot pain and a simultaneous DVT most likely from being sedentary from his foot pain.  He was not on xarelto prior to either of these events so I would not consider this to be a failure of xarelto.  Discussed with Cardiology and will keep on xarelto.   As far as his current clinical situation is concerned, his incision is healing.  He has a 2+ DP pulse in the right foot.  He has some incision pain but tells me the toe is not really hurting  Plan:  D/c  JP when  output less than 30 cc over 24 hours Ambulate D/c home when ambulatory and drain out on whichever oral anticoagulant cardiology chooses. Needs rate control prior to d/c defer to Cardiology Will need close follow up with cardiology and he seems to have been lost to follow up after his intitial diagnosis several years ago.  [name], MD [facility] [place] Office: [phone]    Laboratory Lab Results: Recent Labs    06/28/19 0214 06/29/19 0300  WBC 7.5 6.6  HGB 14.2 13.8  HCT 43.4 41.2  PLT 114* 127*   BMET Recent Labs    06/26/19 0859 06/27/19 1429  NA 139 138  K 3.5 3.7  CL 108 108  CO2 19*  --   GLUCOSE 100* 87  BUN 17 16  CREATININE 1.22 1.00  CALCIUM 7.9*  --     COAG No results found for: INR, PROTIME No results found for: PTT

## 2019-06-29 NOTE — Progress Notes (Signed)
Mobility Team Note    06/29/19 1350  Mobility  Activity Ambulated in hall  Level of Assistance Modified independent, requires aide device or extra time  Assistive Device Front wheel walker  Minutes Ambulated 10 minutes  Distance Ambulated (ft) 400 ft  Mobility Response Tolerated well   RHR 100 HR with amb 130 SpO2 99% on RA with amb  Suanne Marker PT Mobility Team Pager [phone] Office [phone]

## 2019-06-29 NOTE — Progress Notes (Signed)
ANTICOAGULATION CONSULT NOTE - Follow Up Consult  Pharmacy Consult for Xarelto Indication: atrial fibrillation and RLE DVT 04/06/20  Allergies  Allergen Reactions  . Morphine And Related Itching    Patient Measurements: Height: 6' (182.9 cm) Weight: 241 lb (109.3 kg) IBW/kg (Calculated) : 77.6  Vital Signs: Temp: 97.8 F (36.6 C) (03/03 1158) Temp Source: Oral (03/03 1158) BP: 103/90 (03/03 1158) Pulse Rate: 117 (03/03 1158)  Labs: Recent Labs    06/26/19 1622 06/27/19 0204 06/27/19 0204 06/27/19 1429 06/28/19 0214 06/28/19 1200 06/29/19 0300  HGB  --  15.3   < > 17.0 14.2  --  13.8  HCT  --  47.5   < > 50.0 43.4  --  41.2  PLT  --  122*  --   --  114*  --  127*  APTT 80* 78*  --   --   --   --   --   HEPARINUNFRC  --  0.24*   < >  --  0.29* 0.53 0.51  CREATININE  --   --   --  1.00  --   --   --    < > = values in this interval not displayed.    Estimated Creatinine Clearance: 89 mL/min (by C-G formula based on SCr of 1 mg/dL).  Assessment:  17 yom with history of thrombocytopenia and recent R femoral DVT (06/08/19) on Xarelto PTA, remote hx of afib presenting with R foot ischemia with early gangrenous changes to L first toe. Xarelto held, and Pharmacy consulted to dose heparin in anticipation of aortogram lower extremity runoff with possible intervention on Monday 06/27/19. Now s/p thrombectomy on 3/1.  Last PTA dose of Xarelto 2/25 PM.    IV heparin initially changed to Pradaxa this am for possible Xarelto failure, but Rx was asked to change resume to Xarelto before any Pradaxa was given. Not thought to be Xarelto failure.     Xarelto 15 mg BID begun 2/11 and continued until just PTA 2/25.    IV heparin given 2/26>>3/3 am. Was to change to 20 mg daily on 3/4, after 21 days of 15 mg BID.  Discussed briefly with Dr. Oneida Alar this am. To begin maintenance Xarelto dose/ afib dosing today.  Goal of Therapy:  appropriate Xarelto dose for indication Monitor platelets by  anticoagulation protocol: Yes   Plan:   Xarelto 20 mg daily with lunch today, then daily with supper.  Intermittent CBC.  Monitor for s/sx bleeding.  Arty Baumgartner, [place] Phone: [phone] 06/29/2019,3:36 PM

## 2019-06-30 LAB — GLUCOSE, CAPILLARY
Glucose-Capillary: 99 mg/dL (ref 70–99)
Glucose-Capillary: 92 mg/dL (ref 70–99)
Glucose-Capillary: 107 mg/dL — ABNORMAL HIGH (ref 70–99)
Glucose-Capillary: 104 mg/dL — ABNORMAL HIGH (ref 70–99)

## 2019-06-30 MED ORDER — METOPROLOL SUCCINATE ER 100 MG PO TB24
100.0000 mg | ORAL_TABLET | Freq: Every day | ORAL | Status: DC
  Administered 2019-06-30 – 2019-07-01 (×2): 100 mg via ORAL
  Filled 2019-06-30 (×2): qty 1

## 2019-06-30 NOTE — Progress Notes (Signed)
Progress Note    06/30/2019 7:42 AM 3 Days Post-Op  Subjective:  RLE incisional pain, o/w without complaints   Vitals:   06/30/19 0001 06/30/19 0400  BP: 103/73   Pulse: 100   Resp: 16 18  Temp: 98.1 F (36.7 C)   SpO2: 95% 94%    Physical Exam: Cardiac:  irreg rhythm, rate controlled Lungs:  Non labored Incisions:  RLE incision will approx.; no signs of infection; 70cc serosang drain output Extremities:  2+ palpable DP pulse, moderate foot edema. Right great toe: ischemic changes/eschar/erythema. Tender to palpation   CBC    Component Value Date/Time   WBC 6.6 06/29/2019 0300   RBC 4.31 06/29/2019 0300   HGB 13.8 06/29/2019 0300   HCT 41.2 06/29/2019 0300   PLT 127 (L) 06/29/2019 0300   MCV 95.6 06/29/2019 0300   MCH 32.0 06/29/2019 0300   MCHC 33.5 06/29/2019 0300   RDW 12.6 06/29/2019 0300   LYMPHSABS 1.0 06/09/2019 0559   MONOABS 0.5 06/09/2019 0559   EOSABS 0.1 06/09/2019 0559   BASOSABS 0.0 06/09/2019 0559    BMET    Component Value Date/Time   NA 138 06/27/2019 1429   K 3.7 06/27/2019 1429   CL 108 06/27/2019 1429   CO2 19 (L) 06/26/2019 0859   GLUCOSE 87 06/27/2019 1429   BUN 16 06/27/2019 1429   CREATININE 1.00 06/27/2019 1429   CALCIUM 7.9 (L) 06/26/2019 0859   GFRNONAA >60 06/26/2019 0859   GFRAA >60 06/26/2019 0859     Intake/Output Summary (Last 24 hours) at 06/30/2019 0742 Last data filed at 06/30/2019 0932 Gross per 24 hour  Intake --  Output 570 ml  Net -570 ml    HOSPITAL MEDICATIONS Scheduled Meds: . ALPRAZolam  0.25 mg Oral QHS  . aspirin EC  81 mg Oral Daily  . atorvastatin  40 mg Oral QHS  . colchicine  0.6 mg Oral QHS  . doxazosin  4 mg Oral QHS  . doxycycline  100 mg Oral BID  . febuxostat  40 mg Oral QHS  . gemfibrozil  600 mg Oral BID AC  . insulin aspart  0-15 Units Subcutaneous TID WC  . insulin aspart  0-5 Units Subcutaneous QHS  . insulin aspart  4 Units Subcutaneous TID WC  . levothyroxine  112 mcg Oral QHS   . losartan  12.5 mg Oral Daily  . metoprolol succinate  100 mg Oral Daily  . oxybutynin  10 mg Oral QHS  . pantoprazole  40 mg Oral BID  . PARoxetine  40 mg Oral QHS  . pregabalin  150 mg Oral QHS  . rivaroxaban  20 mg Oral Q supper  . sodium chloride flush  3 mL Intravenous Q12H  . temazepam  15 mg Oral QHS  . [START ON 07/01/2019] testosterone cypionate  200 mg Intramuscular Q14 Days   Continuous Infusions: . sodium chloride    . lactated ringers     PRN Meds:.sodium chloride, acetaminophen, alum & mag hydroxide-simeth, guaiFENesin-dextromethorphan, hydrALAZINE, HYDROmorphone (DILAUDID) injection, labetalol, metoprolol tartrate, ondansetron (ZOFRAN) IV, oxyCODONE, phenol, sodium chloride flush, zolpidem  Assessment:  69 y.o. male is s/p: Right popliteal superficial femoral and tibial artery embolectomy.    Plan:He says drain was last emptied yesterday evening and there is currently scant output. Continue drain. Home on Xarelto, beta blocker as per cards for hx of a.fib 3 Days Post-Op   - -DVT prophylaxis:  On Xarelto  Risa Grill, PA-C [facility] [phone] 06/30/2019  7:42 AM

## 2019-06-30 NOTE — Progress Notes (Signed)
Cardiology Progress Note  Patient ID: Jonathon [name] MRN: [id number] DOB: Jun 06, 1950 Date of Encounter: 06/30/2019  Primary Cardiologist: Evalina Field, MD  Subjective  No complaints this AM. Increased metoprolol this AM.   ROS:  All other ROS reviewed and negative. Pertinent positives noted in the HPI.     Inpatient Medications  Scheduled Meds: . ALPRAZolam  0.25 mg Oral QHS  . aspirin EC  81 mg Oral Daily  . atorvastatin  40 mg Oral QHS  . colchicine  0.6 mg Oral QHS  . doxazosin  4 mg Oral QHS  . doxycycline  100 mg Oral BID  . febuxostat  40 mg Oral QHS  . gemfibrozil  600 mg Oral BID AC  . insulin aspart  0-15 Units Subcutaneous TID WC  . insulin aspart  0-5 Units Subcutaneous QHS  . insulin aspart  4 Units Subcutaneous TID WC  . levothyroxine  112 mcg Oral QHS  . losartan  12.5 mg Oral Daily  . metoprolol succinate  100 mg Oral Daily  . oxybutynin  10 mg Oral QHS  . pantoprazole  40 mg Oral BID  . PARoxetine  40 mg Oral QHS  . pregabalin  150 mg Oral QHS  . rivaroxaban  20 mg Oral Q supper  . sodium chloride flush  3 mL Intravenous Q12H  . temazepam  15 mg Oral QHS  . [START ON 07/01/2019] testosterone cypionate  200 mg Intramuscular Q14 Days   Continuous Infusions: . sodium chloride    . lactated ringers     PRN Meds: sodium chloride, acetaminophen, alum & mag hydroxide-simeth, guaiFENesin-dextromethorphan, hydrALAZINE, HYDROmorphone (DILAUDID) injection, labetalol, metoprolol tartrate, ondansetron (ZOFRAN) IV, oxyCODONE, phenol, sodium chloride flush, zolpidem   Vital Signs   Vitals:   06/29/19 2008 06/30/19 0001 06/30/19 0400 06/30/19 0829  BP: 105/74 103/73  111/88  Pulse: 98 100  74  Resp: 19 16 18 15   Temp: 98 F (36.7 C) 98.1 F (36.7 C)  98.1 F (36.7 C)  TempSrc: Oral Oral  Oral  SpO2: 94% 95% 94% 98%  Weight:      Height:        Intake/Output Summary (Last 24 hours) at 06/30/2019 1014 Last data filed at 06/30/2019 0639 Gross per 24 hour    Intake --  Output 570 ml  Net -570 ml   Last 3 Weights 06/24/2019 06/23/2019 06/08/2019  Weight (lbs) 241 lb 252 lb 252 lb  Weight (kg) 109.317 kg 114.306 kg 114.306 kg      Telemetry  Overnight telemetry shows atrial fibrillation with heart rate in the 90s, which I personally reviewed.   Physical Exam   Vitals:   06/29/19 2008 06/30/19 0001 06/30/19 0400 06/30/19 0829  BP: 105/74 103/73  111/88  Pulse: 98 100  74  Resp: 19 16 18 15   Temp: 98 F (36.7 C) 98.1 F (36.7 C)  98.1 F (36.7 C)  TempSrc: Oral Oral  Oral  SpO2: 94% 95% 94% 98%  Weight:      Height:         Intake/Output Summary (Last 24 hours) at 06/30/2019 1014 Last data filed at 06/30/2019 0639 Gross per 24 hour  Intake --  Output 570 ml  Net -570 ml    Last 3 Weights 06/24/2019 06/23/2019 06/08/2019  Weight (lbs) 241 lb 252 lb 252 lb  Weight (kg) 109.317 kg 114.306 kg 114.306 kg    Body mass index is 32.69 kg/m.  General: Well nourished, well developed, in no  acute distress Head: Atraumatic, normal size  Eyes: PEERLA, EOMI  Neck: Supple, no JVD Endocrine: No thryomegaly Cardiac: Irregular rhythm, no murmurs rubs or gallops Lungs: Clear to auscultation bilaterally, no wheezing, rhonchi or rales  Abd: Soft, nontender, no hepatomegaly  Ext: Right lower extremity is red and discoloration is noted in the right great toe nonpalpable pulses Musculoskeletal: No deformities, BUE and BLE strength normal and equal Skin: Warm and dry, no rashes   Neuro: Alert and oriented to person, place, time, and situation, CNII-XII grossly intact, no focal deficits  Psych: Normal mood and affect   Labs  High Sensitivity Troponin:  No results for input(s): TROPONINIHS in the last 720 hours.   Cardiac EnzymesNo results for input(s): TROPONINI in the last 168 hours. No results for input(s): TROPIPOC in the last 168 hours.  Chemistry Recent Labs  Lab 06/24/19 2221 06/24/19 2221 06/25/19 0844 06/26/19 0859 06/27/19 1429  NA  139   < > 140 139 138  K 2.9*   < > 3.3* 3.5 3.7  CL 102   < > 105 108 108  CO2 22  --  23 19*  --   GLUCOSE 128*   < > 111* 100* 87  BUN 19   < > 17 17 16   CREATININE 1.47*   < > 1.26* 1.22 1.00  CALCIUM 9.0  --  8.6* 7.9*  --   PROT 6.6  --   --   --   --   ALBUMIN 3.1*  --   --   --   --   AST 18  --   --   --   --   ALT 16  --   --   --   --   ALKPHOS 51  --   --   --   --   BILITOT 0.9  --   --   --   --   GFRNONAA 48*  --  58* >60  --   GFRAA 56*  --  >60 >60  --   ANIONGAP 15  --  12 12  --    < > = values in this interval not displayed.    Hematology Recent Labs  Lab 06/27/19 0204 06/27/19 0204 06/27/19 1429 06/28/19 0214 06/29/19 0300  WBC 5.7  --   --  7.5 6.6  RBC 4.72  --   --  4.44 4.31  HGB 15.3   < > 17.0 14.2 13.8  HCT 47.5   < > 50.0 43.4 41.2  MCV 100.6*  --   --  97.7 95.6  MCH 32.4  --   --  32.0 32.0  MCHC 32.2  --   --  32.7 33.5  RDW 12.9  --   --  12.8 12.6  PLT 122*  --   --  114* 127*   < > = values in this interval not displayed.   BNPNo results for input(s): BNP, PROBNP in the last 168 hours.  DDimer No results for input(s): DDIMER in the last 168 hours.   Radiology  No results found.  Cardiac Studies  ECHO: 06/25/2019 1. Left ventricular ejection fraction, by estimation, is 35 to 40%. The  left ventricle has moderately decreased function. The left ventricle  demonstrates global hypokinesis. Unable to assess LV diastolic filling due  ot underlying atrial fibrillation.  2. Right ventricular systolic function is normal. The right ventricular  size is normal. There is normal pulmonary artery systolic pressure.  3.  Left atrial size was mildly dilated.  4. The mitral valve is normal in structure and function. Trivial mitral  valve regurgitation. No evidence of mitral stenosis.  5. The aortic valve is tricuspid. Aortic valve regurgitation is not  visualized. Mild aortic valve sclerosis is present, with no evidence of  aortic valve  stenosis.  6. The inferior vena cava is dilated in size with >50% respiratory  variability, suggesting right atrial pressure of 8 mmHg.   ABI's: 06/28/2019 Right: Resting right ankle-brachial index is within normal range. No  evidence of significant right lower extremity arterial disease. Absent  waveform in right great toe.   Left: Resting left ankle-brachial index is within normal range. No  evidence of significant left lower extremity arterial disease. The left  toe-brachial index is normal.    Patient Profile  Mr. Ulibarri is a 69 year old male with medical history of diabetes, hypertension, hyperlipidemia, thrombocytopenia, anemia, CKD, atrial fibrillation (unknown duration) who was admitted on 06/24/2019 for lower extremity arterial thromboembolic event.  He is status post thrombectomy with vascular surgery.  Assessment & Plan   1.  Right lower extremity arterial thromboembolic event in the setting of atrial fibrillation -Per vascular surgery this was likely there at the time of the diagnosis of his DVT.  This is not represent failed Xarelto therapy.  We will continue Xarelto for anticoagulation.  He will remain on this indefinitely for his A. fib  2.  Persistent atrial fibrillation -I have increased his metoprolol succinate 100 mg daily -Xarelto indefinitely as above -I suspect he had atrial fibrillation for years.  His left atrium is mildly dilated.  He has no symptoms from this.  We will discuss rate control versus rhythm control strategies after he is healed up from surgery and has no other planned procedures.  Vascular surgery is going to watch him for a while to determine if he will need amputation of the toe.  Any attempt at rhythm control would require uninterrupted anticoagulation.  Overall, this is not the time for that.  We will let him heal up from surgery and then make decisions on that moving forward.  I will arrange for him to see me in clinic in 2 to 4 weeks.  3.   Subclinical LV dysfunction -Echo shows EF around 35-40%.  But on my review it appears to be around 45-50%. -He will continue metoprolol succinate as above -We have added low-dose losartan which he will go home with. -No need for Lasix at this time.   [facility] will sign off.   Medication Recommendations: Metoprolol succinate 100 mg daily, losartan 12.5 mg daily, Xarelto (treatment dose for active thrombus and then he will resume appropriate dose for atrial fibrillation) Other recommendations (labs, testing, etc): None Follow up as an outpatient: I will arrange follow-up in 2 to 4 weeks to see me in clinic  Signed, [place] T. Audie Box, [place]  06/30/2019 10:17 AM

## 2019-06-30 NOTE — Progress Notes (Signed)
MOBILITY TEAM - Progress Note   06/30/19 1311  Mobility  Activity Ambulated to bathroom;Ambulated in hall  Level of Assistance Modified independent, requires aide device or extra time  Assistive Device Front wheel walker  Distance Ambulated (ft) 440 ft  Mobility Response Tolerated well  Bed Position  (seated in recliner)   Pre-activity: HR 87, BP 112/85, SpO2 97% on RA During activity: HR 105-131  Post-activity: HR 95, BP 121/92, SpO2 98% on RA   Mabeline Caras, PT, DPT Mobility Team Pager [phone]

## 2019-06-30 NOTE — Progress Notes (Signed)
[facility] [place]  Subjective  - less pain   Objective 103/73 100 98.1 F (36.7 C) (Oral) 18 94%  Intake/Output Summary (Last 24 hours) at 06/30/2019 0753 Last data filed at 06/30/2019 5825 Gross per 24 hour  Intake --  Output 570 ml  Net -570 ml   Dry gangrene right first toe otherwise foot viable, incision intact no drainage Mild edema, foot pink warm Drain 70 cc bloody  Assessment/Planning: S/p embolectomy right leg Viable foot watchful waiting on toe Afib rate control anticoagulation per cardiology Will d/c drain in AM and most likely d/c home tomorrow  Jonathon [name] 06/30/2019 7:53 AM --  Laboratory Lab Results: Recent Labs    06/28/19 0214 06/29/19 0300  WBC 7.5 6.6  HGB 14.2 13.8  HCT 43.4 41.2  PLT 114* 127*   BMET Recent Labs    06/27/19 1429  NA 138  K 3.7  CL 108  GLUCOSE 87  BUN 16  CREATININE 1.00    COAG No results found for: INR, PROTIME No results found for: PTT

## 2019-06-30 NOTE — Care Management Important Message (Signed)
Important Message  Patient Details  Name: Royalty Domagala MRN: [id number] Date of Birth: 1951/03/02   Medicare Important Message Given:  Yes     Shelda Altes 06/30/2019, 10:11 AM

## 2019-07-01 ENCOUNTER — Other Ambulatory Visit: Payer: Self-pay | Admitting: Physician Assistant

## 2019-07-01 ENCOUNTER — Encounter (HOSPITAL_BASED_OUTPATIENT_CLINIC_OR_DEPARTMENT_OTHER): Payer: Medicare Other | Admitting: Internal Medicine

## 2019-07-01 LAB — GLUCOSE, CAPILLARY: Glucose-Capillary: 84 mg/dL (ref 70–99)

## 2019-07-01 MED ORDER — ASPIRIN 81 MG PO TBEC: 81.0000 mg | DELAYED_RELEASE_TABLET | Freq: Every day | ORAL | Status: DC

## 2019-07-01 MED ORDER — RIVAROXABAN 20 MG PO TABS: 20.0000 mg | ORAL_TABLET | Freq: Every day | ORAL | 6 refills | Status: DC

## 2019-07-01 MED ORDER — METOPROLOL SUCCINATE ER 100 MG PO TB24: 100.0000 mg | ORAL_TABLET | Freq: Every day | ORAL | 0 refills | Status: DC

## 2019-07-01 MED ORDER — LOSARTAN POTASSIUM 25 MG PO TABS: 12.5000 mg | ORAL_TABLET | Freq: Every day | ORAL | 0 refills | Status: DC

## 2019-07-01 MED ORDER — OXYCODONE HCL 5 MG PO TABS: 5.0000 mg | ORAL_TABLET | Freq: Four times a day (QID) | ORAL | 0 refills | Status: DC | PRN

## 2019-07-01 NOTE — Progress Notes (Signed)
Pt JP drain removed. Pt tolerated well. 10cc drainage in drain. Minimal bleeding after removal. Site dressed. Jerald Kief, RN

## 2019-07-01 NOTE — Progress Notes (Addendum)
  Progress Note    07/01/2019 7:40 AM 4 Days Post-Op  Subjective:  No complaints.  Wants to go home   Vitals:   07/01/19 0000 07/01/19 0427  BP:  103/83  Pulse:  71  Resp:  (!) 23  Temp:  98.2 F (36.8 C)  SpO2: 95% 100%   Physical Exam: Lungs:  Non labored Incisions:  R pop incision c/d/i Extremities:  Palpable R DP Abdomen:  soft Neurologic: A&O  CBC    Component Value Date/Time   WBC 6.6 06/29/2019 0300   RBC 4.31 06/29/2019 0300   HGB 13.8 06/29/2019 0300   HCT 41.2 06/29/2019 0300   PLT 127 (L) 06/29/2019 0300   MCV 95.6 06/29/2019 0300   MCH 32.0 06/29/2019 0300   MCHC 33.5 06/29/2019 0300   RDW 12.6 06/29/2019 0300   LYMPHSABS 1.0 06/09/2019 0559   MONOABS 0.5 06/09/2019 0559   EOSABS 0.1 06/09/2019 0559   BASOSABS 0.0 06/09/2019 0559    BMET    Component Value Date/Time   NA 138 06/27/2019 1429   K 3.7 06/27/2019 1429   CL 108 06/27/2019 1429   CO2 19 (L) 06/26/2019 0859   GLUCOSE 87 06/27/2019 1429   BUN 16 06/27/2019 1429   CREATININE 1.00 06/27/2019 1429   CALCIUM 7.9 (L) 06/26/2019 0859   GFRNONAA >60 06/26/2019 0859   GFRAA >60 06/26/2019 0859    INR No results found for: INR   Intake/Output Summary (Last 24 hours) at 07/01/2019 0740 Last data filed at 07/01/2019 0618 Gross per 24 hour  Intake 600 ml  Output 1751 ml  Net -1151 ml     Assessment/Plan:  69 y.o. male is s/p RLE thrombectomy 4 Days Post-Op   R foot well perfused D/c JP drain D/c home today Follow up with Dr. Oneida Alar in a couple weeks to recheck gangrenous toe Follow up with Cardiology as an outpatient; recommendations will be reflected in discharge med rec   Dagoberto Ligas, PA-C [facility] [phone] 07/01/2019 7:40 AM  Drain out Dry gangrene right first toe Mild edema 2+ DP pulse Follow up in a few weeks D/c home on [place], MD [facility] [place]: [phone]

## 2019-07-01 NOTE — Discharge Summary (Signed)
Discharge Summary  Patient ID: Jonathon [name] [id number] 69 y.o. 19-Feb-1951  Admit date: 06/24/2019  Discharge date and time: 07/01/2019 10:46 AM   Admitting Physician: Elam Dutch, MD   Discharge Physician: Same  Admission Diagnoses: PAD (peripheral artery disease) [facility]) [I73.9]  Discharge Diagnoses: Same  Admission Condition: fair  Discharged Condition: fair  Indication for Admission: Ischemic right lower extremity  Hospital Course: Mr. [name] is a 69 year old male who was admitted to the hospital 06/24/2019 for IV heparin and Xarelto washout in preparation for aortogram with lower extremity runoff.  This was performed by Dr. Oneida Alar on 3/1.  Angiography demonstrated what appeared to be embolic material thus he was brought to the operating room on the same day and underwent right popliteal, superficial femoral, and tibial artery embolectomy by Dr. Oneida Alar.  He tolerated procedure well.  Cardiology was consulted for additional rate control for atrial fibrillation.  He was transitioned from IV heparin back to Xarelto postoperatively.  Throughout the the rest of his hospital stay he maintained a palpable DP pulse.  Other recommendations by cardiology included addition of losartan and dose adjustment of beta-blockade.  He will follow-up with cardiology as an outpatient for management of atrial fibrillation.  He will follow-up with Dr. Oneida Alar in a couple weeks for reassessment of gangrenous right great toe.  Patient is currently on interested in toe amputation and this is a reasonable plan given that it is only dry gangrene.  He will be prescribed 1 to 2 days of narcotic pain medication for continued postoperative pain control.  He will be discharged home this morning in stable condition.  Consults: cardiology  Treatments: surgery: Aortogram 06/27/19 by Dr. Oneida Alar Right popliteal, superficial femoral, and tibial artery embolectomy 06/27/2019 by Dr. Oneida Alar  Discharge Exam: See  progress note 07/01/2019 Vitals:   07/01/19 0427 07/01/19 0820  BP: 103/83 127/79  Pulse: 71 85  Resp: (!) 23 20  Temp: 98.2 F (36.8 C) 98.4 F (36.9 C)  SpO2: 100% 96%     Disposition: Discharge disposition: 01-Home or Self Care       Patient Instructions:  Allergies as of 07/01/2019      Reactions   Morphine And Related Itching      Medication List    TAKE these medications   ALPRAZolam 0.25 MG tablet Commonly known as: XANAX Take 0.25 mg by mouth at bedtime.   aspirin 81 MG EC tablet Take 1 tablet (81 mg total) by mouth daily.   atorvastatin 40 MG tablet Commonly known as: LIPITOR Take 40 mg by mouth at bedtime.   colchicine 0.6 MG tablet Take 0.6 mg by mouth at bedtime.   doxazosin 4 MG tablet Commonly known as: CARDURA Take 4 mg by mouth at bedtime.   doxycycline 100 MG capsule Commonly known as: VIBRAMYCIN Take 100 mg by mouth 2 (two) times daily.   febuxostat 40 MG tablet Commonly known as: ULORIC Take 40 mg by mouth at bedtime.   gemfibrozil 600 MG tablet Commonly known as: LOPID Take 600 mg by mouth 2 (two) times daily before a meal.   ibuprofen 200 MG tablet Commonly known as: ADVIL Take 800 mg by mouth every 8 (eight) hours as needed for moderate pain.   Jardiance 10 MG Tabs tablet Generic drug: empagliflozin Take 10 mg by mouth at bedtime.   levothyroxine 112 MCG tablet Commonly known as: SYNTHROID Take 112 mcg by mouth at bedtime.   losartan 25 MG tablet Commonly known as: COZAAR Take 0.5  tablets (12.5 mg total) by mouth daily.   Lyrica 75 MG capsule Generic drug: pregabalin Take 150 mg by mouth at bedtime.   metFORMIN 500 MG 24 hr tablet Commonly known as: GLUCOPHAGE-XR Take 1,000 mg by mouth at bedtime.   metoprolol succinate 100 MG 24 hr tablet Commonly known as: TOPROL-XL Take 1 tablet (100 mg total) by mouth daily. Take with or immediately following a meal.   oxybutynin 10 MG 24 hr tablet Commonly known as:  DITROPAN-XL Take 10 mg by mouth at bedtime.   oxyCODONE 5 MG immediate release tablet Commonly known as: Oxy IR/ROXICODONE Take 1 tablet (5 mg total) by mouth every 6 (six) hours as needed for moderate pain.   pantoprazole 40 MG tablet Commonly known as: PROTONIX Take 40 mg by mouth 2 (two) times daily.   PARoxetine 40 MG tablet Commonly known as: PAXIL Take 40 mg by mouth at bedtime.   potassium chloride SA 20 MEQ tablet Commonly known as: KLOR-CON Take 60 mEq by mouth at bedtime.   rivaroxaban 20 MG Tabs tablet Commonly known as: XARELTO Take 1 tablet (20 mg total) by mouth daily with supper. What changed:   medication strength  how much to take  when to take this  Another medication with the same name was removed. Continue taking this medication, and follow the directions you see here.   temazepam 15 MG capsule Commonly known as: RESTORIL Take 15 mg by mouth at bedtime.   testosterone cypionate 100 MG/ML injection Commonly known as: DEPOTESTOTERONE CYPIONATE Inject 200 mg into the muscle every 14 (fourteen) days.      Activity: activity as tolerated Diet: regular diet Wound Care: keep wound clean and dry  Follow-up with Dr. Oneida Alar in 2 weeks.  Signed: Dagoberto Ligas, PA-C 07/01/2019 4:13 PM [facility] Office: [phone]

## 2019-07-01 NOTE — Progress Notes (Addendum)
MOBILITY TEAM - Progress Note   07/01/19 0846  Mobility  Activity Transferred:  Bed to chair  Level of Assistance Modified independent, requires aide device or extra time  Assistive Device  (handheld assist)  Distance Ambulated (ft) 4 ft  Mobility Response Tolerated well  Bed Position  (seated in recliner)   Pt preparing for d/c home this morning; declined hallway ambulation, agreeable to walk to recliner to sit up while waiting for d/c.   Mabeline Caras, PT, DPT Mobility Team Pager [phone]

## 2019-07-01 NOTE — Plan of Care (Signed)
  Problem: Education: Goal: Knowledge of General Education information will improve Description Including pain rating scale, medication(s)/side effects and non-pharmacologic comfort measures Outcome: Progressing   Problem: Health Behavior/Discharge Planning: Goal: Ability to manage health-related needs will improve Outcome: Progressing   

## 2019-07-01 NOTE — Progress Notes (Signed)
Pt provided discharge instructions and education. PT IV removed and intact. CCMD notified/tele removed Pt denies complaints. Vitals stable. Pt has all belongings. Volunteers called to tx pt to valet to meet ride.  Jerald Kief, RN

## 2019-07-07 ENCOUNTER — Other Ambulatory Visit: Payer: Self-pay | Admitting: *Deleted

## 2019-07-07 NOTE — Patient Outreach (Signed)
[facility]) Care Management  07/07/2019  Jonathon [name] 1950/08/16 [id number]   EMMI-GENERAL DISCHARGE-RESOLVED RED ON EMMI ALERT Day # 1 & 4 Date: 3/7 & 3/10 Red Alert Reason: Questions/ Lot of interest in things  Outreach # 1 RN spoke with pt concerning the above emmi. Pt states no questions and lost of interest issues have resolved with no additional needs. Pt verified he has a pending appointment with the surgeon. Offered any other assistance at this time (declined).    Plan: Will close with no additional needs.  Raina Mina, RN Care Management Coordinator [place] Office [phone]

## 2019-07-13 NOTE — Progress Notes (Signed)
Cardiology Office Note:   Date:  07/15/2019  NAME:  Jonathon [name]    MRN: [id number] DOB:  26-Aug-1950   PCP:  Aurea Graff, PA-C  Cardiologist:  Evalina Field, MD   Referring MD: Aurea Graff, PA-C   Chief Complaint  Patient presents with  . Atrial Fibrillation   History of Present Illness:   Jonathon [name] is a 69 y.o. male with a hx of atrial fibrillation, subclinical LV dysfunction, PAD, DVT, diabetes who presents for follow-up. He was admitted 06/24/2019-07/01/2019 for arterial thromboembolic event to the RLE. He was diagnosed with DVT to the RLE in February and then re-represented with arterial event. Vascular surgery felt like the arterial clot was there at the time of DVT and he did not fail xarelto. Echo showed subclinical LV dysfunction and no heart failure symptoms.   He presents today and is severely hypotensive with blood pressure 76/52.  On my recheck 70/50.  He is tachycardic with a thready pulse.  He reports he just feels fatigued.  He denies any cough, chest pain, shortness of breath, fever.  He reports no pain with urination or dysuria.  He has had recent right lower extremity surgery due to the thromboembolic event.  He reports no open wounds or worsening rash.  He reports he feels that the skin is healing up there.  He does appear noticeably ill on my examination.  I recommended he go to the emergency room for evaluation of his hypotension.   Problem List 1. Persistent atrial fibrillation  2. Subclinical LV dysfunction ~45% 3. PAD -RLE arterial thromboembolic event 4. DVT 5. Diabetes  -A1c 6.4  Past Medical History: Past Medical History:  Diagnosis Date  . Anemia due to blood loss, acute 2014   2nd GIB  . Anxiety   . CKD (chronic kidney disease), stage III 2014  . Diabetes mellitus without complication ([place])   . Gout   . High cholesterol   . Hypertension 2014  . Hypothyroid   . Neuropathy   . PUD (peptic ulcer disease) 2014    Past  Surgical History: Past Surgical History:  Procedure Laterality Date  . ABDOMINAL AORTOGRAM W/LOWER EXTREMITY Bilateral 06/27/2019   Procedure: ABDOMINAL AORTOGRAM W/LOWER EXTREMITY;  Surgeon: Elam Dutch, MD;  Location: [place] [facility];  Service: Vascular;  Laterality: Bilateral;  . ADRENALECTOMY    . EMBOLECTOMY Right 06/27/2019   Procedure: RIGHT LEG EMBOLECTOMY;  Surgeon: Elam Dutch, MD;  Location: [facility] OR;  Service: Vascular;  Laterality: Right;    Current Medications: Current Meds  Medication Sig  . ALPRAZolam (XANAX) 0.25 MG tablet Take 0.25 mg by mouth at bedtime.   [name] aspirin EC 81 MG EC tablet Take 1 tablet (81 mg total) by mouth daily.  [name] atorvastatin (LIPITOR) 40 MG tablet Take 40 mg by mouth at bedtime.   . colchicine 0.6 MG tablet Take 0.6 mg by mouth at bedtime.   [name] doxazosin (CARDURA) 4 MG tablet Take 4 mg by mouth at bedtime.   [name] doxycycline (VIBRAMYCIN) 100 MG capsule Take 100 mg by mouth 2 (two) times daily.  . febuxostat (ULORIC) 40 MG tablet Take 40 mg by mouth at bedtime.   [name] gemfibrozil (LOPID) 600 MG tablet Take 600 mg by mouth 2 (two) times daily before a meal.   . ibuprofen (ADVIL) 200 MG tablet Take 800 mg by mouth every 8 (eight) hours as needed for moderate pain.   [name] JARDIANCE 10 MG TABS tablet Take  10 mg by mouth at bedtime.   [name] levothyroxine (SYNTHROID) 112 MCG tablet Take 112 mcg by mouth at bedtime.   [name] losartan (COZAAR) 25 MG tablet TAKE 1/2 TABLET(12.5 MG) BY MOUTH DAILY  . metFORMIN (GLUCOPHAGE-XR) 500 MG 24 hr tablet Take 1,000 mg by mouth at bedtime.   . metoprolol succinate (TOPROL-XL) 100 MG 24 hr tablet TAKE 1 TABLET(100 MG) BY MOUTH DAILY WITH OR IMMEDIATELY FOLLOWING A MEAL  . oxybutynin (DITROPAN-XL) 10 MG 24 hr tablet Take 10 mg by mouth at bedtime.   [name] oxyCODONE (OXY IR/ROXICODONE) 5 MG immediate release tablet Take 1 tablet (5 mg total) by mouth every 6 (six) hours as needed for moderate pain.  . pantoprazole (PROTONIX) 40 MG tablet  Take 40 mg by mouth 2 (two) times daily.   [name] PARoxetine (PAXIL) 40 MG tablet Take 40 mg by mouth at bedtime.   . potassium chloride SA (KLOR-CON) 20 MEQ tablet Take 60 mEq by mouth at bedtime.   . pregabalin (LYRICA) 75 MG capsule Take 150 mg by mouth at bedtime.   . rivaroxaban (XARELTO) 20 MG TABS tablet Take 1 tablet (20 mg total) by mouth daily with supper.  . temazepam (RESTORIL) 15 MG capsule Take 15 mg by mouth at bedtime.   [name] testosterone cypionate (DEPOTESTOTERONE CYPIONATE) 100 MG/ML injection Inject 200 mg into the muscle every 14 (fourteen) days.      Allergies:    Morphine and related   Social History: Social History   Socioeconomic History  . Marital status: Married    Spouse name: Not on file  . Number of children: Not on file  . Years of education: Not on file  . Highest education level: Not on file  Occupational History  . Not on file  Tobacco Use  . Smoking status: Former Research scientist (life sciences)  . Smokeless tobacco: Never Used  Substance and Sexual Activity  . Alcohol use: Yes    Comment: daily  . Drug use: Never  . Sexual activity: Not on file  Other Topics Concern  . Not on file  Social History Narrative  . Not on file   Social Determinants of Health   Financial Resource Strain:   . Difficulty of Paying Living Expenses:   Food Insecurity:   . Worried About Charity fundraiser in the Last Year:   . Arboriculturist in the Last Year:   Transportation Needs:   . Film/video editor (Medical):   [name] Lack of Transportation (Non-Medical):   Physical Activity:   . Days of Exercise per Week:   . Minutes of Exercise per Session:   Stress:   . Feeling of Stress :   Social Connections:   . Frequency of Communication with Friends and Family:   . Frequency of Social Gatherings with Friends and Family:   . Attends Religious Services:   . Active Member of Clubs or Organizations:   . Attends Archivist Meetings:   [name] Marital Status:      Family History: The  patient's family history includes Diabetes Mellitus II in his maternal aunt.  ROS:   All other ROS reviewed and negative. Pertinent positives noted in the HPI.     EKGs/Labs/Other Studies Reviewed:   The following studies were personally reviewed by me today:  EKG:  EKG is ordered today.  The ekg ordered today demonstrates atrial fibrillation, heart rate 130, no acute ST-T changes, old septal infarct, and was personally reviewed by me.  Recent Labs: 06/24/2019: ALT 16 06/27/2019: BUN 16; Creatinine, Ser 1.00; Potassium 3.7; Sodium 138 06/29/2019: Hemoglobin 13.8; Platelets 127   Recent Lipid Panel No results found for: CHOL, TRIG, HDL, CHOLHDL, VLDL, LDLCALC, LDLDIRECT  Physical Exam:   VS:  BP (!) 76/52   Pulse (!) 130   Temp (!) 96.9 F (36.1 C)   Ht 6' (1.829 m)   Wt 167 lb 3.2 oz (75.8 kg)   SpO2 99%   BMI 22.68 kg/m    Wt Readings from Last 3 Encounters:  07/15/19 167 lb 3.2 oz (75.8 kg)  06/24/19 241 lb (109.3 kg)  06/23/19 252 lb (114.3 kg)    General: Ill-appearing Heart: Atraumatic, normal size  Eyes: PEERLA, EOMI  Neck: Supple, no JVD Endocrine: No thryomegaly Cardiac: Irregular rhythm, tachycardia noted, thready pulses Lungs: Clear to auscultation bilaterally, no wheezing, rhonchi or rales  Abd: Soft, nontender, no hepatomegaly  Ext: No edema, right lower extremity appears warm on examination, no open wounds noted, left lower extremity with absent pulses Musculoskeletal: No deformities, BUE and BLE strength normal and equal Skin: Right lower extremity Neuro: Alert awake, oriented to person and place, moving all extremities Psych: Normal mood and affect   ASSESSMENT:   Damaris Geers is a 69 y.o. male who presents for the following: 1. Persistent atrial fibrillation ([place])   2. Hypotension, unspecified hypotension type   3. Chronic systolic heart failure ([place])   4. PAD (peripheral artery disease) ([place])   5. Deep vein thrombosis (DVT) of proximal vein of  right lower extremity, unspecified chronicity ([place])     PLAN:   1. Atrial fibrillation with RVR 2. Hypotension, unspecified hypotension type -He presents for follow-up from recent hospitalization of right lower extremity embolectomy from thrombus.  His blood pressure is 70/50 on multiple rechecks.  He is fatigued and ill-appearing.  He is in A. fib with RVR.  I suspect he is septic.  I have informed him that we will have to call 911 to have him sent to the emergency room for further evaluation.  He reports no fevers cough, chills, worsening rash.  I think it is best evaluated in the emergency room first.  He does have a history of atrial fibrillation but I suspect this is a secondary process given his hypotension.  3. Chronic systolic heart failure (HCC) -EF around 45%.  Is not volume overload on exam.  Does not appear to be in any evidence of low output state.  His extremities are warm, I highly suspect this is sepsis. -Instructed to hold all blood pressure medications moving forward.  Will have him evaluated the emergency room.  4. PAD (peripheral artery disease) ([place]) -Status post right lower extremity thromboembolic event.  He will remain on Xarelto.  5. Deep vein thrombosis (DVT) of proximal vein of right lower extremity, unspecified chronicity ([place]) -Continue on Xarelto  He was sent emergently to the emergency room for hypotension.  We will plan to see him back in 2 weeks to reevaluate everything.  Disposition: Return in about 2 weeks (around 07/29/2019).  Medication Adjustments/Labs and Tests Ordered: Current medicines are reviewed at length with the patient today.  Concerns regarding medicines are outlined above.  Orders Placed This Encounter  Procedures  . EKG 12-Lead   No orders of the defined types were placed in this encounter.   Patient Instructions  Medication Instructions:  The current medical regimen is effective;  continue present plan and medications.  *If you need  a refill on  your cardiac medications before your next appointment, please call your pharmacy*  Follow-Up: At [facility], you and your health needs are our priority.  As part of our continuing mission to provide you with exceptional heart care, we have created designated Provider Care Teams.  These Care Teams include your primary Cardiologist (physician) and Advanced Practice Providers (APPs -  Physician Assistants and Nurse Practitioners) who all work together to provide you with the care you need, when you need it.  We recommend signing up for the patient portal called "MyChart".  Sign up information is provided on this After Visit Summary.  MyChart is used to connect with patients for Virtual Visits (Telemedicine).  Patients are able to view lab/test results, encounter notes, upcoming appointments, etc.  Non-urgent messages can be sent to your provider as well.   To learn more about what you can do with MyChart, go to [email or web address].    Your next appointment:   2 week(s)  The format for your next appointment:   In Person  Provider:   Eleonore Chiquito, MD   Other Instructions Report to hospital for hypotensive.       Time Spent with Patient: I have spent a total of 35 minutes with patient reviewing hospital notes, telemetry, EKGs, labs and examining the patient as well as establishing an assessment and plan that was discussed with the patient.  > 50% of time was spent in direct patient care.  Signed, Addison Naegeli. Audie Box, [facility]  [address], [place], [place] [address] [phone]  07/15/2019 10:35 AM

## 2019-07-15 ENCOUNTER — Encounter (HOSPITAL_COMMUNITY): Payer: Self-pay

## 2019-07-15 ENCOUNTER — Inpatient Hospital Stay (HOSPITAL_COMMUNITY)
Admission: EM | Admit: 2019-07-15 | Discharge: 2019-07-19 | DRG: 854 | Disposition: A | Discharge status: Home Health | Payer: Medicare Other | Source: Ambulatory Visit | Attending: Family Medicine | Admitting: Family Medicine

## 2019-07-15 ENCOUNTER — Emergency Department (HOSPITAL_COMMUNITY): Payer: Medicare Other

## 2019-07-15 ENCOUNTER — Encounter: Payer: Self-pay | Admitting: Cardiovascular Disease

## 2019-07-15 ENCOUNTER — Ambulatory Visit (INDEPENDENT_AMBULATORY_CARE_PROVIDER_SITE_OTHER): Payer: Medicare Other | Admitting: Cardiovascular Disease

## 2019-07-15 ENCOUNTER — Other Ambulatory Visit: Payer: Self-pay

## 2019-07-15 VITALS — BP 76/52 | HR 130 | Temp 96.9°F | Ht 72.0 in | Wt 167.2 lb

## 2019-07-15 DIAGNOSIS — Z7982 Long term (current) use of aspirin: Secondary | ICD-10-CM

## 2019-07-15 DIAGNOSIS — Z6831 Body mass index (BMI) 31.0-31.9, adult: Secondary | ICD-10-CM

## 2019-07-15 DIAGNOSIS — Z885 Allergy status to narcotic agent status: Secondary | ICD-10-CM

## 2019-07-15 DIAGNOSIS — I824Y1 Acute embolism and thrombosis of unspecified deep veins of right proximal lower extremity: Secondary | ICD-10-CM | POA: Diagnosis not present

## 2019-07-15 DIAGNOSIS — I5022 Chronic systolic (congestive) heart failure: Secondary | ICD-10-CM

## 2019-07-15 DIAGNOSIS — E785 Hyperlipidemia, unspecified: Secondary | ICD-10-CM | POA: Diagnosis present

## 2019-07-15 DIAGNOSIS — E1122 Type 2 diabetes mellitus with diabetic chronic kidney disease: Secondary | ICD-10-CM | POA: Diagnosis not present

## 2019-07-15 DIAGNOSIS — I96 Gangrene, not elsewhere classified: Secondary | ICD-10-CM

## 2019-07-15 DIAGNOSIS — R6510 Systemic inflammatory response syndrome (SIRS) of non-infectious origin without acute organ dysfunction: Secondary | ICD-10-CM | POA: Diagnosis not present

## 2019-07-15 DIAGNOSIS — Z7984 Long term (current) use of oral hypoglycemic drugs: Secondary | ICD-10-CM

## 2019-07-15 DIAGNOSIS — E039 Hypothyroidism, unspecified: Secondary | ICD-10-CM | POA: Diagnosis present

## 2019-07-15 DIAGNOSIS — M109 Gout, unspecified: Secondary | ICD-10-CM | POA: Diagnosis present

## 2019-07-15 DIAGNOSIS — Z9089 Acquired absence of other organs: Secondary | ICD-10-CM

## 2019-07-15 DIAGNOSIS — F411 Generalized anxiety disorder: Secondary | ICD-10-CM | POA: Diagnosis not present

## 2019-07-15 DIAGNOSIS — E1152 Type 2 diabetes mellitus with diabetic peripheral angiopathy with gangrene: Secondary | ICD-10-CM | POA: Diagnosis not present

## 2019-07-15 DIAGNOSIS — I4891 Unspecified atrial fibrillation: Secondary | ICD-10-CM | POA: Diagnosis present

## 2019-07-15 DIAGNOSIS — E1129 Type 2 diabetes mellitus with other diabetic kidney complication: Secondary | ICD-10-CM | POA: Diagnosis present

## 2019-07-15 DIAGNOSIS — I739 Peripheral vascular disease, unspecified: Secondary | ICD-10-CM | POA: Diagnosis not present

## 2019-07-15 DIAGNOSIS — N179 Acute kidney failure, unspecified: Secondary | ICD-10-CM | POA: Diagnosis present

## 2019-07-15 DIAGNOSIS — A419 Sepsis, unspecified organism: Secondary | ICD-10-CM | POA: Diagnosis present

## 2019-07-15 DIAGNOSIS — I10 Essential (primary) hypertension: Secondary | ICD-10-CM | POA: Diagnosis present

## 2019-07-15 DIAGNOSIS — I4819 Other persistent atrial fibrillation: Secondary | ICD-10-CM

## 2019-07-15 DIAGNOSIS — Z20822 Contact with and (suspected) exposure to covid-19: Secondary | ICD-10-CM | POA: Diagnosis not present

## 2019-07-15 DIAGNOSIS — E669 Obesity, unspecified: Secondary | ICD-10-CM | POA: Diagnosis present

## 2019-07-15 DIAGNOSIS — K219 Gastro-esophageal reflux disease without esophagitis: Secondary | ICD-10-CM | POA: Diagnosis present

## 2019-07-15 DIAGNOSIS — E876 Hypokalemia: Secondary | ICD-10-CM | POA: Diagnosis not present

## 2019-07-15 DIAGNOSIS — Z7901 Long term (current) use of anticoagulants: Secondary | ICD-10-CM

## 2019-07-15 DIAGNOSIS — F1210 Cannabis abuse, uncomplicated: Secondary | ICD-10-CM | POA: Diagnosis present

## 2019-07-15 DIAGNOSIS — I824Z1 Acute embolism and thrombosis of unspecified deep veins of right distal lower extremity: Secondary | ICD-10-CM | POA: Diagnosis present

## 2019-07-15 DIAGNOSIS — I482 Chronic atrial fibrillation, unspecified: Secondary | ICD-10-CM | POA: Diagnosis not present

## 2019-07-15 DIAGNOSIS — M19071 Primary osteoarthritis, right ankle and foot: Secondary | ICD-10-CM | POA: Diagnosis present

## 2019-07-15 DIAGNOSIS — Z87891 Personal history of nicotine dependence: Secondary | ICD-10-CM

## 2019-07-15 DIAGNOSIS — Z86718 Personal history of other venous thrombosis and embolism: Secondary | ICD-10-CM | POA: Diagnosis not present

## 2019-07-15 DIAGNOSIS — G629 Polyneuropathy, unspecified: Secondary | ICD-10-CM

## 2019-07-15 DIAGNOSIS — I517 Cardiomegaly: Secondary | ICD-10-CM | POA: Diagnosis not present

## 2019-07-15 DIAGNOSIS — N400 Enlarged prostate without lower urinary tract symptoms: Secondary | ICD-10-CM | POA: Diagnosis present

## 2019-07-15 DIAGNOSIS — I444 Left anterior fascicular block: Secondary | ICD-10-CM | POA: Diagnosis present

## 2019-07-15 DIAGNOSIS — D696 Thrombocytopenia, unspecified: Secondary | ICD-10-CM | POA: Diagnosis not present

## 2019-07-15 DIAGNOSIS — I959 Hypotension, unspecified: Secondary | ICD-10-CM | POA: Diagnosis not present

## 2019-07-15 DIAGNOSIS — Z79899 Other long term (current) drug therapy: Secondary | ICD-10-CM

## 2019-07-15 DIAGNOSIS — E1140 Type 2 diabetes mellitus with diabetic neuropathy, unspecified: Secondary | ICD-10-CM | POA: Diagnosis not present

## 2019-07-15 DIAGNOSIS — Z8711 Personal history of peptic ulcer disease: Secondary | ICD-10-CM

## 2019-07-15 DIAGNOSIS — R000 Tachycardia, unspecified: Secondary | ICD-10-CM | POA: Diagnosis not present

## 2019-07-15 DIAGNOSIS — S91101A Unspecified open wound of right great toe without damage to nail, initial encounter: Secondary | ICD-10-CM | POA: Diagnosis not present

## 2019-07-15 DIAGNOSIS — Z833 Family history of diabetes mellitus: Secondary | ICD-10-CM

## 2019-07-15 DIAGNOSIS — I130 Hypertensive heart and chronic kidney disease with heart failure and stage 1 through stage 4 chronic kidney disease, or unspecified chronic kidney disease: Secondary | ICD-10-CM | POA: Diagnosis not present

## 2019-07-15 DIAGNOSIS — N183 Chronic kidney disease, stage 3 unspecified: Secondary | ICD-10-CM | POA: Diagnosis present

## 2019-07-15 DIAGNOSIS — L0390 Cellulitis, unspecified: Secondary | ICD-10-CM | POA: Diagnosis not present

## 2019-07-15 DIAGNOSIS — R531 Weakness: Secondary | ICD-10-CM | POA: Diagnosis not present

## 2019-07-15 DIAGNOSIS — N189 Chronic kidney disease, unspecified: Secondary | ICD-10-CM | POA: Diagnosis present

## 2019-07-15 DIAGNOSIS — I509 Heart failure, unspecified: Secondary | ICD-10-CM

## 2019-07-15 DIAGNOSIS — Z79890 Hormone replacement therapy (postmenopausal): Secondary | ICD-10-CM

## 2019-07-15 LAB — CBC WITH DIFFERENTIAL/PLATELET
Abs Immature Granulocytes: 0.01 10*3/uL (ref 0.00–0.07)
Basophils Absolute: 0 10*3/uL (ref 0.0–0.1)
Basophils Relative: 1 %
Eosinophils Absolute: 0.1 10*3/uL (ref 0.0–0.5)
Eosinophils Relative: 2 %
HCT: 47.5 % (ref 39.0–52.0)
Hemoglobin: 15.3 g/dL (ref 13.0–17.0)
Immature Granulocytes: 0 %
Lymphocytes Relative: 11 %
Lymphs Abs: 0.6 10*3/uL — ABNORMAL LOW (ref 0.7–4.0)
MCH: 31.7 pg (ref 26.0–34.0)
MCHC: 32.2 g/dL (ref 30.0–36.0)
MCV: 98.3 fL (ref 80.0–100.0)
Monocytes Absolute: 0.5 10*3/uL (ref 0.1–1.0)
Monocytes Relative: 9 %
Neutro Abs: 4 10*3/uL (ref 1.7–7.7)
Neutrophils Relative %: 77 %
Platelets: 147 10*3/uL — ABNORMAL LOW (ref 150–400)
RBC: 4.83 MIL/uL (ref 4.22–5.81)
RDW: 13.2 % (ref 11.5–15.5)
WBC: 5.2 10*3/uL (ref 4.0–10.5)
nRBC: 0 % (ref 0.0–0.2)

## 2019-07-15 LAB — COMPREHENSIVE METABOLIC PANEL
ALT: 34 U/L (ref 0–44)
AST: 54 U/L — ABNORMAL HIGH (ref 15–41)
Albumin: 2.9 g/dL — ABNORMAL LOW (ref 3.5–5.0)
Alkaline Phosphatase: 61 U/L (ref 38–126)
Anion gap: 12 (ref 5–15)
BUN: 24 mg/dL — ABNORMAL HIGH (ref 8–23)
CO2: 22 mmol/L (ref 22–32)
Calcium: 9.1 mg/dL (ref 8.9–10.3)
Chloride: 103 mmol/L (ref 98–111)
Creatinine, Ser: 2.24 mg/dL — ABNORMAL HIGH (ref 0.61–1.24)
GFR calc Af Amer: 33 mL/min — ABNORMAL LOW (ref >60)
GFR calc non Af Amer: 29 mL/min — ABNORMAL LOW (ref >60)
Glucose, Bld: 119 mg/dL — ABNORMAL HIGH (ref 70–99)
Potassium: 5 mmol/L (ref 3.5–5.1)
Sodium: 137 mmol/L (ref 135–145)
Total Bilirubin: 1 mg/dL (ref 0.3–1.2)
Total Protein: 6.5 g/dL (ref 6.5–8.1)

## 2019-07-15 LAB — MAGNESIUM: Magnesium: 1.1 mg/dL — ABNORMAL LOW (ref 1.7–2.4)

## 2019-07-15 LAB — LACTIC ACID, PLASMA
Lactic Acid, Venous: 1.9 mmol/L (ref 0.5–1.9)
Lactic Acid, Venous: 1.6 mmol/L (ref 0.5–1.9)

## 2019-07-15 LAB — TSH: TSH: 0.969 u[IU]/mL (ref 0.350–4.500)

## 2019-07-15 LAB — GLUCOSE, CAPILLARY
Glucose-Capillary: 96 mg/dL (ref 70–99)
Glucose-Capillary: 96 mg/dL (ref 70–99)

## 2019-07-15 LAB — PHOSPHORUS: Phosphorus: 3 mg/dL (ref 2.5–4.6)

## 2019-07-15 LAB — SARS CORONAVIRUS 2 (TAT 6-24 HRS): SARS Coronavirus 2: NEGATIVE

## 2019-07-15 MED ORDER — SODIUM CHLORIDE 0.9 % IV SOLN: INTRAVENOUS | Status: DC

## 2019-07-15 MED ORDER — PAROXETINE HCL 20 MG PO TABS
40.0000 mg | ORAL_TABLET | Freq: Every day | ORAL | Status: DC
  Administered 2019-07-15 – 2019-07-18 (×5): 40 mg via ORAL
  Filled 2019-07-15 (×5): qty 2

## 2019-07-15 MED ORDER — OXYBUTYNIN CHLORIDE ER 10 MG PO TB24
10.0000 mg | ORAL_TABLET | Freq: Every day | ORAL | Status: DC
  Administered 2019-07-15 – 2019-07-18 (×4): 10 mg via ORAL
  Filled 2019-07-15 (×5): qty 1

## 2019-07-15 MED ORDER — LEVOTHYROXINE SODIUM 112 MCG PO TABS
112.0000 ug | ORAL_TABLET | Freq: Every day | ORAL | Status: DC
  Administered 2019-07-15 – 2019-07-18 (×4): 112 ug via ORAL
  Filled 2019-07-15 (×4): qty 1

## 2019-07-15 MED ORDER — INSULIN ASPART 100 UNIT/ML ~~LOC~~ SOLN: 0.0000 [IU] | Freq: Every day | SUBCUTANEOUS | Status: DC

## 2019-07-15 MED ORDER — SODIUM CHLORIDE 0.9 % IV SOLN
2.0000 g | Freq: Once | INTRAVENOUS | Status: AC
  Administered 2019-07-15: 2 g via INTRAVENOUS
  Filled 2019-07-15: qty 20

## 2019-07-15 MED ORDER — SODIUM CHLORIDE 0.9 % IV BOLUS (SEPSIS)
1000.0000 mL | Freq: Once | INTRAVENOUS | Status: AC
  Administered 2019-07-15: 1000 mL via INTRAVENOUS

## 2019-07-15 MED ORDER — PANTOPRAZOLE SODIUM 40 MG PO TBEC
40.0000 mg | DELAYED_RELEASE_TABLET | Freq: Two times a day (BID) | ORAL | Status: DC
  Administered 2019-07-15 – 2019-07-19 (×8): 40 mg via ORAL
  Filled 2019-07-15 (×9): qty 1

## 2019-07-15 MED ORDER — VANCOMYCIN HCL 1750 MG/350ML IV SOLN: 1750.0000 mg | INTRAVENOUS | Status: DC

## 2019-07-15 MED ORDER — VANCOMYCIN HCL IN DEXTROSE 1-5 GM/200ML-% IV SOLN: 1000.0000 mg | Freq: Once | INTRAVENOUS | Status: DC

## 2019-07-15 MED ORDER — OXYCODONE HCL 5 MG PO TABS
5.0000 mg | ORAL_TABLET | Freq: Four times a day (QID) | ORAL | Status: DC | PRN
  Administered 2019-07-18: 5 mg via ORAL
  Filled 2019-07-15: qty 1

## 2019-07-15 MED ORDER — TEMAZEPAM 15 MG PO CAPS
15.0000 mg | ORAL_CAPSULE | Freq: Every day | ORAL | Status: DC
  Administered 2019-07-15 – 2019-07-18 (×4): 15 mg via ORAL
  Filled 2019-07-15 (×3): qty 1

## 2019-07-15 MED ORDER — ACETAMINOPHEN 650 MG RE SUPP: 650.0000 mg | Freq: Four times a day (QID) | RECTAL | Status: DC | PRN

## 2019-07-15 MED ORDER — ALPRAZOLAM 0.25 MG PO TABS
0.2500 mg | ORAL_TABLET | Freq: Every day | ORAL | Status: DC
  Administered 2019-07-15 – 2019-07-18 (×4): 0.25 mg via ORAL
  Filled 2019-07-15 (×4): qty 1

## 2019-07-15 MED ORDER — ONDANSETRON HCL 4 MG PO TABS: 4.0000 mg | ORAL_TABLET | Freq: Four times a day (QID) | ORAL | Status: DC | PRN

## 2019-07-15 MED ORDER — ASPIRIN EC 81 MG PO TBEC
81.0000 mg | DELAYED_RELEASE_TABLET | Freq: Every day | ORAL | Status: DC
  Administered 2019-07-15 – 2019-07-19 (×4): 81 mg via ORAL
  Filled 2019-07-15 (×4): qty 1

## 2019-07-15 MED ORDER — ACETAMINOPHEN 325 MG PO TABS
650.0000 mg | ORAL_TABLET | Freq: Four times a day (QID) | ORAL | Status: DC | PRN
  Administered 2019-07-18: 650 mg via ORAL
  Filled 2019-07-15: qty 2

## 2019-07-15 MED ORDER — GEMFIBROZIL 600 MG PO TABS
600.0000 mg | ORAL_TABLET | Freq: Two times a day (BID) | ORAL | Status: DC
  Administered 2019-07-16: 600 mg via ORAL
  Filled 2019-07-15 (×7): qty 1

## 2019-07-15 MED ORDER — ONDANSETRON HCL 4 MG/2ML IJ SOLN: 4.0000 mg | Freq: Four times a day (QID) | INTRAMUSCULAR | Status: DC | PRN

## 2019-07-15 MED ORDER — RIVAROXABAN 20 MG PO TABS
20.0000 mg | ORAL_TABLET | Freq: Every day | ORAL | Status: DC
  Administered 2019-07-15 – 2019-07-16 (×2): 20 mg via ORAL
  Filled 2019-07-15 (×2): qty 1

## 2019-07-15 MED ORDER — VANCOMYCIN HCL 1750 MG/350ML IV SOLN
1750.0000 mg | Freq: Once | INTRAVENOUS | Status: AC
  Administered 2019-07-15: 1750 mg via INTRAVENOUS
  Filled 2019-07-15: qty 350

## 2019-07-15 MED ORDER — ATORVASTATIN CALCIUM 40 MG PO TABS
40.0000 mg | ORAL_TABLET | Freq: Every day | ORAL | Status: DC
  Administered 2019-07-15 – 2019-07-18 (×4): 40 mg via ORAL
  Filled 2019-07-15 (×4): qty 1

## 2019-07-15 MED ORDER — CEFTRIAXONE SODIUM 1 G IJ SOLR
1.0000 g | INTRAMUSCULAR | Status: DC
Start: 2019-07-16 — End: 2019-07-19
  Administered 2019-07-16 – 2019-07-19 (×4): 1 g via INTRAVENOUS
  Filled 2019-07-15 (×4): qty 10

## 2019-07-15 MED ORDER — PREGABALIN 75 MG PO CAPS
150.0000 mg | ORAL_CAPSULE | Freq: Every day | ORAL | Status: DC
  Administered 2019-07-15 – 2019-07-18 (×4): 150 mg via ORAL
  Filled 2019-07-15 (×4): qty 2

## 2019-07-15 MED ORDER — INSULIN ASPART 100 UNIT/ML ~~LOC~~ SOLN
0.0000 [IU] | Freq: Three times a day (TID) | SUBCUTANEOUS | Status: DC
  Administered 2019-07-19: 2 [IU] via SUBCUTANEOUS

## 2019-07-15 NOTE — Consult Note (Signed)
Pt known to me.  Recent embolectomy.  He has palpable pulses in his feet.  He has dry gangrene right first toe but healing Medial side is a little moist   Will place dry gauze between toes  Will recheck Sunday if still wet may need toe amp on Monday If dries out no intervention for now  [name], MD [facility] Office: [phone]

## 2019-07-15 NOTE — Progress Notes (Signed)
Pharmacy Antibiotic Note  Jonathon [name] is a 69 y.o. male admitted on 07/15/2019 with sepsis and cellulitis.  Pharmacy has been consulted for vancomycin dosing. Ceftriaxone also ordered x 1 dose per EDP. Afebrile, WBC wnl, LA 1.9. SCr 2.4 on admit (baseline ~1.0-1.5 recently).  Plan: Ceftriaxone 2g IV x 1 per EDP - f/u if needs to continue Vancomycin 1750mg  IV x1; then Vancomycin 1750 mg IV Q 48 hrs. Goal AUC 400-550. Expected AUC: 497 SCr used: 2.24 Monitor clinical progress, c/s, renal function F/u de-escalation plan/LOT, vancomycin levels as indicated   Height: 6' (182.9 cm) Weight: 167 lb 8.8 oz (76 kg) IBW/kg (Calculated) : 77.6  Temp (24hrs), Avg:97.8 F (36.6 C), Min:96.9 F (36.1 C), Max:98.6 F (37 C)  No results for input(s): WBC, CREATININE, LATICACIDVEN, VANCOTROUGH, VANCOPEAK, VANCORANDOM, GENTTROUGH, GENTPEAK, GENTRANDOM, TOBRATROUGH, TOBRAPEAK, TOBRARND, AMIKACINPEAK, AMIKACINTROU, AMIKACIN in the last 168 hours.  Estimated Creatinine Clearance: 74.9 mL/min (by C-G formula based on SCr of 1 mg/dL).    Allergies  Allergen Reactions  . Morphine And Related Itching    Antimicrobials this admission: 3/19 vancomycin >>  3/19 ceftriaxone x 1   Dose adjustments this admission:   Microbiology results:   [name], PharmD, BCPS Please check AMION for all [facility] contact numbers Clinical Pharmacist 07/15/2019 12:18 PM

## 2019-07-15 NOTE — ED Triage Notes (Signed)
Pt BIB [facility] for eval of hypotension this AM while at cardiologist appt. Pt reports he had regularly scheduled appt this AM, was found to be hypotensive to the 70s in the office. Pt also has known R foot/great toe infection w/ hx of R DVT thrombectomy approx 1 month ago. Pt denies feeling ill, states he does feel weak. Hx of neuropathy to blt LE, denies pain.

## 2019-07-15 NOTE — ED Provider Notes (Addendum)
[facility] Provider Note   CSN: [id number] Arrival date & time: 07/15/19  1147     History Chief Complaint  Patient presents with  . Foot Infection    Jonathon [name] is a 69 y.o. male.  HPI   Presents with concern of weakness, wound on his foot.  History is obtained by the patient himself and EMS providers. Patient notes that he has diabetes, nephropathy, had peripheral embolectomy earlier this month. He notes that over the last days he has had persistent discoloration from his right toe, and weakness in general.  No focal weakness, no known fever, no syncope, no pain superior to the leg. EMS reports that on their arrival the patient had hypotension, 70 systolic.  This improved with 2 five 0 mL of fluid resuscitation in route, but systolic blood pressure was less than 100 just prior to ED arrival. The patient acknowledges weakness, states that he feels generally poorly, denies of the focal pain, as above.  It seems though he has been taking all medication as directed including Xarelto.  Past Medical History:  Diagnosis Date  . Anemia due to blood loss, acute 2014   2nd GIB  . Anxiety   . CKD (chronic kidney disease), stage III 2014  . Diabetes mellitus without complication ([place])   . Gout   . High cholesterol   . Hypertension 2014  . Hypothyroid   . Neuropathy   . PUD (peptic ulcer disease) 2014    Patient Active Problem List   Diagnosis Date Noted  . PAD (peripheral artery disease) ([place]) 06/24/2019  . DVT, lower extremity, distal, acute, right ([place]) 06/09/2019  . Essential hypertension 06/09/2019  . CKD (chronic kidney disease) stage 3, GFR 30-59 ml/min 06/09/2019  . DM (diabetes mellitus), type 2 with renal complications ([place]) 74/03/8785  . Thrombocytopenia ([place]) 06/09/2019  . Elevated hemoglobin ([place]) 06/09/2019  . Hypothyroidism 06/09/2019  . Diabetic neuropathy ([place]) 06/09/2019  . Gout 06/09/2019  . Hyperlipidemia  06/09/2019  . GAD (generalized anxiety disorder) 06/09/2019  . Cellulitis of right foot 06/08/2019    Past Surgical History:  Procedure Laterality Date  . ABDOMINAL AORTOGRAM W/LOWER EXTREMITY Bilateral 06/27/2019   Procedure: ABDOMINAL AORTOGRAM W/LOWER EXTREMITY;  Surgeon: Elam Dutch, MD;  Location: [facility];  Service: Vascular;  Laterality: Bilateral;  . ADRENALECTOMY    . EMBOLECTOMY Right 06/27/2019   Procedure: RIGHT LEG EMBOLECTOMY;  Surgeon: Elam Dutch, MD;  Location: [facility] OR;  Service: Vascular;  Laterality: Right;       Family History  Problem Relation Age of Onset  . Diabetes Mellitus II Maternal Aunt     Social History   Tobacco Use  . Smoking status: Former Research scientist (life sciences)  . Smokeless tobacco: Never Used  Substance Use Topics  . Alcohol use: Yes    Comment: daily  . Drug use: Never    Home Medications Prior to Admission medications   Medication Sig Start Date End Date Taking? Authorizing Provider  ALPRAZolam (XANAX) 0.25 MG tablet Take 0.25 mg by mouth at bedtime.  04/14/19   [provider]  aspirin EC 81 MG EC tablet Take 1 tablet (81 mg total) by mouth daily. 07/01/19   Dagoberto Ligas, PA-C  atorvastatin (LIPITOR) 40 MG tablet Take 40 mg by mouth at bedtime.     [provider]  colchicine 0.6 MG tablet Take 0.6 mg by mouth at bedtime.     [provider]  doxazosin (CARDURA) 4 MG  tablet Take 4 mg by mouth at bedtime.     [provider]  doxycycline (VIBRAMYCIN) 100 MG capsule Take 100 mg by mouth 2 (two) times daily. 06/08/19   [provider]  febuxostat (ULORIC) 40 MG tablet Take 40 mg by mouth at bedtime.  04/15/19   [provider]  gemfibrozil (LOPID) 600 MG tablet Take 600 mg by mouth 2 (two) times daily before a meal.     [provider]  ibuprofen (ADVIL) 200 MG tablet Take 800 mg by mouth every 8 (eight) hours as needed for moderate pain.     [provider]    JARDIANCE 10 MG TABS tablet Take 10 mg by mouth at bedtime.  05/19/19   [provider]  levothyroxine (SYNTHROID) 112 MCG tablet Take 112 mcg by mouth at bedtime.     [provider]  losartan (COZAAR) 25 MG tablet TAKE 1/2 TABLET(12.5 MG) BY MOUTH DAILY 07/04/19   Waynetta Sandy, MD  metFORMIN (GLUCOPHAGE-XR) 500 MG 24 hr tablet Take 1,000 mg by mouth at bedtime.     [provider]  metoprolol succinate (TOPROL-XL) 100 MG 24 hr tablet TAKE 1 TABLET(100 MG) BY MOUTH DAILY WITH OR IMMEDIATELY FOLLOWING A MEAL 07/04/19   Waynetta Sandy, MD  oxybutynin (DITROPAN-XL) 10 MG 24 hr tablet Take 10 mg by mouth at bedtime.     [provider]  oxyCODONE (OXY IR/ROXICODONE) 5 MG immediate release tablet Take 1 tablet (5 mg total) by mouth every 6 (six) hours as needed for moderate pain. 07/01/19   Dagoberto Ligas, PA-C  pantoprazole (PROTONIX) 40 MG tablet Take 40 mg by mouth 2 (two) times daily.  09/17/12   [provider]  PARoxetine (PAXIL) 40 MG tablet Take 40 mg by mouth at bedtime.     [provider]  potassium chloride SA (KLOR-CON) 20 MEQ tablet Take 60 mEq by mouth at bedtime.  09/17/12   [provider]  pregabalin (LYRICA) 75 MG capsule Take 150 mg by mouth at bedtime.  09/10/16   [provider]  rivaroxaban (XARELTO) 20 MG TABS tablet Take 1 tablet (20 mg total) by mouth daily with supper. 07/01/19   Setzer, Edman Circle, PA-C  temazepam (RESTORIL) 15 MG capsule Take 15 mg by mouth at bedtime.  05/18/19   [provider]  testosterone cypionate (DEPOTESTOTERONE CYPIONATE) 100 MG/ML injection Inject 200 mg into the muscle every 14 (fourteen) days.  02/22/19   [provider]    Allergies    Morphine and related  Review of Systems   Review of Systems  Constitutional:       Per HPI, otherwise negative  HENT:       Per HPI, otherwise negative  Respiratory:       Per HPI, otherwise negative   Cardiovascular:       Per HPI, otherwise negative  Gastrointestinal: Negative for vomiting.  Endocrine:       Negative aside from HPI  Genitourinary:       Neg aside from HPI   Musculoskeletal:       Per HPI, otherwise negative  Skin: Positive for color change and wound.  Neurological: Positive for weakness. Negative for syncope.    Physical Exam Updated Vital Signs BP 101/71 (BP Location: Right Arm)   Pulse 96   Temp 98.6 F (37 C)   Resp 18   Ht 6' (1.829 m)   Wt 76 kg   SpO2 96%  BMI 22.72 kg/m   Physical Exam Vitals and nursing note reviewed.  Constitutional:      General: He is not in acute distress.    Appearance: He is well-developed.  HENT:     Head: Normocephalic and atraumatic.  Eyes:     Conjunctiva/sclera: Conjunctivae normal.  Cardiovascular:     Rate and Rhythm: Normal rate. Rhythm irregular.  Pulmonary:     Effort: Pulmonary effort is normal. No respiratory distress.     Breath sounds: No stridor.  Abdominal:     General: There is no distension.  Musculoskeletal:       Legs:  Skin:    General: Skin is warm and dry.  Neurological:     Mental Status: He is alert and oriented to person, place, and time.     ED Results / Procedures / Treatments   Labs (all labs ordered are listed, but only abnormal results are displayed) Labs Reviewed  COMPREHENSIVE METABOLIC PANEL - Abnormal; Notable for the following components:      Result Value   Glucose, Bld 119 (*)    BUN 24 (*)    Creatinine, Ser 2.24 (*)    Albumin 2.9 (*)    AST 54 (*)    GFR calc non Af Amer 29 (*)    GFR calc Af Amer 33 (*)    All other components within normal limits  CBC WITH DIFFERENTIAL/PLATELET - Abnormal; Notable for the following components:   Platelets 147 (*)    Lymphs Abs 0.6 (*)    All other components within normal limits  CULTURE, BLOOD (ROUTINE X 2)  CULTURE, BLOOD (ROUTINE X 2)  URINE CULTURE  SARS CORONAVIRUS 2 (TAT 6-24 HRS)  LACTIC ACID, PLASMA   LACTIC ACID, PLASMA  URINALYSIS, ROUTINE W REFLEX MICROSCOPIC    EKG EKG Interpretation  Date/Time:  Friday July 15 2019 11:48:33 EDT Ventricular Rate:  103 PR Interval:    QRS Duration: 105 QT Interval:  343 QTC Calculation: 449 R Axis:   -68 Text Interpretation: Atrial fibrillation Left anterior fascicular block Probable anteroseptal infarct, old Abnormal ECG Confirmed by Carmin Muskrat [phone]) on 07/15/2019 2:07:58 PM   Radiology DG Chest Port 1 View  Result Date: 07/15/2019 CLINICAL DATA:  History of open wound. EXAM: PORTABLE CHEST 1 VIEW COMPARISON:  Chest x-ray 08/05/2017. FINDINGS: Mediastinum hilar structures normal. Lungs are clear. No pleural effusion or pneumothorax. Heart size stable. No acute bony abnormality identified. IMPRESSION: 1.  Cardiomegaly.  No pulmonary venous congestion. 2.  No acute pulmonary disease. Electronically Signed   By: Marcello Moores  Register   On: 07/15/2019 12:32   DG Toe Great Right  Result Date: 07/15/2019 CLINICAL DATA:  Open wound EXAM: RIGHT FIRST TOE: 3 V COMPARISON:  Right foot June 08, 2019 FINDINGS: Frontal, oblique, and lateral views were obtained. Soft tissue air is noted along the lateral aspect of the first digit. There is no erosive change or bony destruction appreciable. No fracture or dislocation. There is slight narrowing of the first MTP joint. Other joint spaces appear normal. IMPRESSION: Soft tissue air along the lateral aspect of the first digit. Mild osteoarthritic change first MTP joint. No bony destruction. No fracture or dislocation. Electronically Signed   By: Lowella Grip III M.D.   On: 07/15/2019 12:34    Procedures Procedures (including critical care time)  CRITICAL CARE Performed by: Carmin Muskrat Total critical care time: 35 minutes Critical care time was exclusive of separately billable procedures and treating other patients. Critical  care was necessary to treat or prevent imminent or life-threatening  deterioration. Critical care was time spent personally by me on the following activities: development of treatment plan with patient and/or surrogate as well as nursing, discussions with consultants, evaluation of patient's response to treatment, examination of patient, obtaining history from patient or surrogate, ordering and performing treatments and interventions, ordering and review of laboratory studies, ordering and review of radiographic studies, pulse oximetry and re-evaluation of patient's condition.   Medications Ordered in ED Medications  sodium chloride 0.9 % bolus 1,000 mL (has no administration in time range)  vancomycin (VANCOCIN) IVPB 1000 mg/200 mL premix (has no administration in time range)  cefTRIAXone (ROCEPHIN) 2 g in sodium chloride 0.9 % 100 mL IVPB (has no administration in time range)    ED Course  I have reviewed the triage vital signs and the nursing notes.  Pertinent labs & imaging results that were available during my care of the patient were reviewed by me and considered in my medical decision making (see chart for details).  Immediately after arrival with concern for sepsis versus SIRS the patient had fluid resuscitation, antibiotics, vancomycin and Rocephin provided, labs sent, x-rays ordered.  Update:, Labs notable for acute kidney injury with near tripling of his creatinine.  X-rays reviewed, no obvious pneumonia, nor osteomyelitis.  Blood pressure has begun to improve, and initial lactic acid level is not remarkable.  This elderly male with known peripheral vascular disease presents with cutaneous changes concerning for cellulitis/dry gangrene and findings concerning for SIRS.  After initial resuscitation in the emergency department blood pressure improved.  With these findings, patient did require admission for further monitoring, management.  Final Clinical Impression(s) / ED Diagnoses Final diagnoses:  SIRS (systemic inflammatory response syndrome) ([place])   Gangrene of toe ([place])  AKI (acute kidney injury) ([place])      Carmin Muskrat, MD 07/15/19 1438    Carmin Muskrat, MD 07/15/19 1439    Carmin Muskrat, MD 07/15/19 1538

## 2019-07-15 NOTE — Patient Instructions (Signed)
Medication Instructions:  The current medical regimen is effective;  continue present plan and medications.  *If you need a refill on your cardiac medications before your next appointment, please call your pharmacy*  Follow-Up: At [facility], you and your health needs are our priority.  As part of our continuing mission to provide you with exceptional heart care, we have created designated Provider Care Teams.  These Care Teams include your primary Cardiologist (physician) and Advanced Practice Providers (APPs -  Physician Assistants and Nurse Practitioners) who all work together to provide you with the care you need, when you need it.  We recommend signing up for the patient portal called "MyChart".  Sign up information is provided on this After Visit Summary.  MyChart is used to connect with patients for Virtual Visits (Telemedicine).  Patients are able to view lab/test results, encounter notes, upcoming appointments, etc.  Non-urgent messages can be sent to your provider as well.   To learn more about what you can do with MyChart, go to [email or web address].    Your next appointment:   2 week(s)  The format for your next appointment:   In Person  Provider:   Eleonore Chiquito, MD   Other Instructions Report to hospital for hypotensive.

## 2019-07-15 NOTE — H&P (Signed)
History and Physical    Jonathon [name] QQV:[id number] DOB: 1950-05-18 DOA: 07/15/2019  PCP: Aurea Graff, PA-C  Patient coming from: Cardiology office  I have personally briefly reviewed patient's old medical records in [place]  Chief Complaint: Low blood pressure  HPI: Jonathon [name] is a 69 y.o. male with medical history significant of hypertension, hyperlipidemia, hypothyroidism, diabetes mellitus, diabetic neuropathy, generalized anxiety disorder, chronic kidney disease stage III, gout, A. fib, PAD/DVT on Xarelto presents to emergency department due to low blood pressure.  Patient tells me that he went to see his cardiologist this morning for follow-up where his blood pressure was noted to be on the lower side (76/52) he was tachycardic with thready pulse.  EKG was obtained which showed A. fib with RVR.  He reports generalized weakness and was advised by the cardiologist to go to the emergency department for further evaluation and management.  Patient tells me that he has generalized weakness, fatigue and lethargic since 2 to 3 weeks.  Denies association with headache, blurry vision, dizziness, chest pain, shortness of breath, palpitation, leg swelling, orthopnea, PND, fever, chills, nausea, vomiting, diarrhea, dysuria, hematuria, melena, decreased appetite.  He was admitted from 06/24/2019 to 07/01/2019 for atrial thromboembolic event to right lower extremity.  Vascular surgery performed right lower extremity surgery due to thromboembolic event.  Patient tells me that his right leg is doing fine, no pain, no worsening redness, swelling in the right toe.  He lives with his wife at home.  Denies smoking however drinks 1-2 vodka per day and uses marijuana which helps him with sleep.  His last marijuana intake was last night.  He uses cane sometimes for ambulation at home.  ED Course: Upon arrival to ED: Patient's blood pressure: 95/68, heart rate in 100s, afebrile with no  leukocytosis, CMP shows worsening kidney function.  Lactic acid: WNL.  UA, UC, BC, COVID-19 pending.  Chest x-ray shows cardiomegaly with no pulmonary edema.  X-ray of right foot was obtained which showed soft tissue air along the lateral aspect of the first digit.  Mild osteoarthritic changes first MTP joint.  No bony destruction.  No fracture or dislocation.  He received IV fluid bolus, IV Vanco and Rocephin in ED.  [facility] hospitalist consulted for admission.  Review of Systems: As per HPI otherwise negative.    Past Medical History:  Diagnosis Date  . Anemia due to blood loss, acute 2014   2nd GIB  . Anxiety   . CKD (chronic kidney disease), stage III 2014  . Diabetes mellitus without complication ([place])   . Gout   . High cholesterol   . Hypertension 2014  . Hypothyroid   . Neuropathy   . PUD (peptic ulcer disease) 2014    Past Surgical History:  Procedure Laterality Date  . ABDOMINAL AORTOGRAM W/LOWER EXTREMITY Bilateral 06/27/2019   Procedure: ABDOMINAL AORTOGRAM W/LOWER EXTREMITY;  Surgeon: Elam Dutch, MD;  Location: [facility];  Service: Vascular;  Laterality: Bilateral;  . ADRENALECTOMY    . EMBOLECTOMY Right 06/27/2019   Procedure: RIGHT LEG EMBOLECTOMY;  Surgeon: Elam Dutch, MD;  Location: [facility] OR;  Service: Vascular;  Laterality: Right;     reports that he has quit smoking. He has never used smokeless tobacco. He reports current alcohol use. He reports that he does not use drugs.  Allergies  Allergen Reactions  . Morphine And Related Itching    Family History  Problem Relation Age of Onset  . Diabetes Mellitus II  Maternal Aunt     Prior to Admission medications   Medication Sig Start Date End Date Taking? Authorizing Provider  ALPRAZolam (XANAX) 0.25 MG tablet Take 0.25 mg by mouth at bedtime.  04/14/19  Yes [provider]  aspirin EC 81 MG EC tablet Take 1 tablet (81 mg total) by mouth daily. 07/01/19  Yes Dagoberto Ligas, PA-C    atorvastatin (LIPITOR) 40 MG tablet Take 40 mg by mouth at bedtime.    Yes [provider]  colchicine 0.6 MG tablet Take 0.6 mg by mouth at bedtime.    Yes [provider]  doxazosin (CARDURA) 4 MG tablet Take 4 mg by mouth at bedtime.    Yes [provider]  febuxostat (ULORIC) 40 MG tablet Take 40 mg by mouth at bedtime.  04/15/19  Yes [provider]  gemfibrozil (LOPID) 600 MG tablet Take 600 mg by mouth 2 (two) times daily before a meal.    Yes [provider]  ibuprofen (ADVIL) 200 MG tablet Take 800 mg by mouth every 8 (eight) hours as needed for moderate pain.    Yes [provider]  JARDIANCE 10 MG TABS tablet Take 10 mg by mouth at bedtime.  05/19/19  Yes [provider]  levothyroxine (SYNTHROID) 112 MCG tablet Take 112 mcg by mouth at bedtime.    Yes [provider]  losartan (COZAAR) 25 MG tablet TAKE 1/2 TABLET(12.5 MG) BY MOUTH DAILY Patient taking differently: Take 12.5 mg by mouth daily.  07/04/19  Yes Waynetta Sandy, MD  metFORMIN (GLUCOPHAGE-XR) 500 MG 24 hr tablet Take 1,000 mg by mouth at bedtime.    Yes [provider]  metoprolol succinate (TOPROL-XL) 100 MG 24 hr tablet TAKE 1 TABLET(100 MG) BY MOUTH DAILY WITH OR IMMEDIATELY FOLLOWING A MEAL Patient taking differently: Take 100 mg by mouth daily.  07/04/19  Yes Waynetta Sandy, MD  oxybutynin (DITROPAN-XL) 10 MG 24 hr tablet Take 10 mg by mouth at bedtime.    Yes [provider]  oxyCODONE (OXY IR/ROXICODONE) 5 MG immediate release tablet Take 1 tablet (5 mg total) by mouth every 6 (six) hours as needed for moderate pain. 07/01/19  Yes Dagoberto Ligas, PA-C  pantoprazole (PROTONIX) 40 MG tablet Take 40 mg by mouth 2 (two) times daily.  09/17/12  Yes [provider]  PARoxetine (PAXIL) 40 MG tablet Take 40 mg by mouth at bedtime.    Yes [provider]  potassium chloride SA (KLOR-CON) 20 MEQ tablet  Take 60 mEq by mouth at bedtime.  09/17/12  Yes [provider]  pregabalin (LYRICA) 75 MG capsule Take 150 mg by mouth at bedtime.  09/10/16  Yes [provider]  rivaroxaban (XARELTO) 20 MG TABS tablet Take 1 tablet (20 mg total) by mouth daily with supper. 07/01/19  Yes Setzer, [address], PA-C  temazepam (RESTORIL) 15 MG capsule Take 15 mg by mouth at bedtime.  05/18/19  Yes [provider]  testosterone cypionate (DEPOTESTOTERONE CYPIONATE) 100 MG/ML injection Inject 200 mg into the muscle every 14 (fourteen) days.  02/22/19  Yes [provider]    Physical Exam: Vitals:   07/15/19 1215 07/15/19 1230 07/15/19 1330 07/15/19 1415  BP: 95/68 103/68 106/78 106/87  Pulse: 85 92 (!) 104 84  Resp: 14 (!) 21 (!) 21 17  Temp:      SpO2: 97% 97% 98% 98%  Weight:      Height:  Constitutional: NAD, calm, comfortable, communicating well, obese. Eyes: PERRL, lids and conjunctivae normal ENMT: Mucous membranes are moist. Posterior pharynx clear of any exudate or lesions.Normal dentition.  Neck: normal, supple, no masses, no thyromegaly Respiratory: clear to auscultation bilaterally, no wheezing, no crackles. Normal respiratory effort. No accessory muscle use.  Cardiovascular: Regular rate and rhythm, no murmurs / rubs / gallops. No extremity edema. 2+ pedal pulses. No carotid bruits.  Abdomen: no tenderness, no masses palpated. No hepatosplenomegaly. Bowel sounds positive.  Musculoskeletal:      Neurologic: CN 2-12 grossly intact. Sensation intact, DTR normal. Strength 5/5 in all 4.  Psychiatric: Normal judgment and insight. Alert and oriented x 3. Normal mood.    Labs on Admission: I have personally reviewed following labs and imaging studies  CBC: Recent Labs  Lab 07/15/19 1202  WBC 5.2  NEUTROABS 4.0  HGB 15.3  HCT 47.5  MCV 98.3  PLT 465*   Basic Metabolic Panel: Recent Labs  Lab 07/15/19 1202  NA 137  K 5.0  CL 103  CO2 22  GLUCOSE  119*  BUN 24*  CREATININE 2.24*  CALCIUM 9.1   GFR: Estimated Creatinine Clearance: 33.5 mL/min (A) (by C-G formula based on SCr of 2.24 mg/dL (H)). Liver Function Tests: Recent Labs  Lab 07/15/19 1202  AST 54*  ALT 34  ALKPHOS 61  BILITOT 1.0  PROT 6.5  ALBUMIN 2.9*   No results for input(s): LIPASE, AMYLASE in the last 168 hours. No results for input(s): AMMONIA in the last 168 hours. Coagulation Profile: No results for input(s): INR, PROTIME in the last 168 hours. Cardiac Enzymes: No results for input(s): CKTOTAL, CKMB, CKMBINDEX, TROPONINI in the last 168 hours. BNP (last 3 results) No results for input(s): PROBNP in the last 8760 hours. HbA1C: No results for input(s): HGBA1C in the last 72 hours. CBG: No results for input(s): GLUCAP in the last 168 hours. Lipid Profile: No results for input(s): CHOL, HDL, LDLCALC, TRIG, CHOLHDL, LDLDIRECT in the last 72 hours. Thyroid Function Tests: No results for input(s): TSH, T4TOTAL, FREET4, T3FREE, THYROIDAB in the last 72 hours. Anemia Panel: No results for input(s): VITAMINB12, FOLATE, FERRITIN, TIBC, IRON, RETICCTPCT in the last 72 hours. Urine analysis: No results found for: COLORURINE, APPEARANCEUR, LABSPEC, PHURINE, GLUCOSEU, HGBUR, BILIRUBINUR, KETONESUR, PROTEINUR, UROBILINOGEN, NITRITE, LEUKOCYTESUR  Radiological Exams on Admission: DG Chest Port 1 View  Result Date: 07/15/2019 CLINICAL DATA:  History of open wound. EXAM: PORTABLE CHEST 1 VIEW COMPARISON:  Chest x-ray 08/05/2017. FINDINGS: Mediastinum hilar structures normal. Lungs are clear. No pleural effusion or pneumothorax. Heart size stable. No acute bony abnormality identified. IMPRESSION: 1.  Cardiomegaly.  No pulmonary venous congestion. 2.  No acute pulmonary disease. Electronically Signed   By: Marcello Moores  Register   On: 07/15/2019 12:32   DG Toe Great Right  Result Date: 07/15/2019 CLINICAL DATA:  Open wound EXAM: RIGHT FIRST TOE: 3 V COMPARISON:  Right foot  June 08, 2019 FINDINGS: Frontal, oblique, and lateral views were obtained. Soft tissue air is noted along the lateral aspect of the first digit. There is no erosive change or bony destruction appreciable. No fracture or dislocation. There is slight narrowing of the first MTP joint. Other joint spaces appear normal. IMPRESSION: Soft tissue air along the lateral aspect of the first digit. Mild osteoarthritic change first MTP joint. No bony destruction. No fracture or dislocation. Electronically Signed   By: Lowella Grip III M.D.   On: 07/15/2019 12:34    EKG: Atrial fibrillation,  Left anterior fascicular block, Probable anteroseptal infarct, old Assessment/Plan Principal Problem:   SIRS (systemic inflammatory response syndrome) (HCC) Active Problems:   DVT, lower extremity, distal, acute, right (HCC)   Essential hypertension   DM (diabetes mellitus), type 2 with renal complications (HCC)   Thrombocytopenia (HCC)   Hypothyroidism   Gout   Hyperlipidemia   GAD (generalized anxiety disorder)   PAD (peripheral artery disease) (HCC)   Acute on chronic kidney failure (HCC)   Congestive heart disease (HCC)   Atrial fibrillation, chronic ([place])   Neuropathy   Marijuana abuse   Sepsis of unknown etiology: -Had recent RLE embolectomy.  Reviewed x-ray of right foot -Patient presented with hypotension and tachycardia with worsening kidney function.  He is afebrile with no leukocytosis.  Lactic acid: WNL. -Chest x-ray is negative for pneumonia. -Patient received IV fluid bolus, IV Vanco and Rocephin in ED.  UA, UC, BC, COVID-19: Pending -Admit patient in stepdown unit for close monitoring. -Continue IV fluids.  Hold antihypertensive medication for now.  Continue to monitor vitals closely. -Consulted vascular surgery-await recommendation  AKI on CKD stage III: -BUN: 24, creatinine: 2.24, GFR: 29 (baseline BUN: 17, creatinine: 1.26, GFR: 58) -Continue IV fluids, hold Metformin, Jardiance,  losartan. -Repeat BMP tomorrow a.m.  A. fib with RVR: -Continue Xarelto, hold metoprolol for now -On telemetry.  Check electrolytes.  PAD/right lower leg acute DVT: -Status post Right popliteal superficial femoral and tibial artery embolectomy on 06/27/19 by vascular surgery-Dr. Oneida Alar. -Consulted vascular surgery -Continue aspirin, statin, Xarelto  Systolic congestive heart failure: Stable -Patient is euvolemic.  Chest x-ray shows no pulmonary edema -Reviewed recent echo-ejection fraction: 45 to 50%. -Strict INO's and daily weight.  Check electrolytes.  Hypertension: Currently blood pressure is on lower side -Hold metoprolol, losartan.  Monitor blood pressure closely.  Diabetes mellitus: Check A1c -Hold Metformin and Jardiance due to worsening kidney function.  Started patient on sliding scale insulin and monitor blood sugar closely.  Diabetic neuropathy: Continue Lyrica  Hyperlipidemia: Continue statin and gemfibrozil  Gout: Hold colchicine and Uloric for now.  Thrombocytopenia: Chronic -Continue to monitor.  Generalized anxiety disorder: Continue Xanax, Paxil and temazepam  BPH: Hold doxazosin due to worsening kidney function  Hypothyroidism: Check TSH -Continue levothyroxine  GERD: Stable -Continue PPI  DVT prophylaxis: Xarelto, SCD/teds Code Status: Full code-confirmed with the patient Family Communication: None present at bedside.  Plan of care discussed with patient in length and he verbalized understanding and agreed with it. Disposition Plan: To be determined  consults called: Vascular surgery-Dr. Oneida Alar  admission status: Inpatient   Mckinley Jewel MD [facility] Pager [phone]  If 7PM-7AM, please contact night-coverage [email or web address] Password [facility]  07/15/2019, 3:09 PM

## 2019-07-15 NOTE — Plan of Care (Signed)

## 2019-07-16 DIAGNOSIS — F411 Generalized anxiety disorder: Secondary | ICD-10-CM

## 2019-07-16 DIAGNOSIS — I482 Chronic atrial fibrillation, unspecified: Secondary | ICD-10-CM

## 2019-07-16 DIAGNOSIS — N179 Acute kidney failure, unspecified: Secondary | ICD-10-CM

## 2019-07-16 DIAGNOSIS — I10 Essential (primary) hypertension: Secondary | ICD-10-CM

## 2019-07-16 LAB — BASIC METABOLIC PANEL
Anion gap: 14 (ref 5–15)
BUN: 23 mg/dL (ref 8–23)
CO2: 21 mmol/L — ABNORMAL LOW (ref 22–32)
Calcium: 8.8 mg/dL — ABNORMAL LOW (ref 8.9–10.3)
Chloride: 103 mmol/L (ref 98–111)
Creatinine, Ser: 1.93 mg/dL — ABNORMAL HIGH (ref 0.61–1.24)
GFR calc Af Amer: 40 mL/min — ABNORMAL LOW (ref >60)
GFR calc non Af Amer: 35 mL/min — ABNORMAL LOW (ref >60)
Glucose, Bld: 89 mg/dL (ref 70–99)
Potassium: 3.7 mmol/L (ref 3.5–5.1)
Sodium: 138 mmol/L (ref 135–145)

## 2019-07-16 LAB — GLUCOSE, CAPILLARY
Glucose-Capillary: 88 mg/dL (ref 70–99)
Glucose-Capillary: 87 mg/dL (ref 70–99)
Glucose-Capillary: 92 mg/dL (ref 70–99)
Glucose-Capillary: 112 mg/dL — ABNORMAL HIGH (ref 70–99)

## 2019-07-16 LAB — CBC
HCT: 47 % (ref 39.0–52.0)
Hemoglobin: 15.2 g/dL (ref 13.0–17.0)
MCH: 31.7 pg (ref 26.0–34.0)
MCHC: 32.3 g/dL (ref 30.0–36.0)
MCV: 98.1 fL (ref 80.0–100.0)
Platelets: 134 10*3/uL — ABNORMAL LOW (ref 150–400)
RBC: 4.79 MIL/uL (ref 4.22–5.81)
RDW: 13.2 % (ref 11.5–15.5)
WBC: 5.7 10*3/uL (ref 4.0–10.5)
nRBC: 0 % (ref 0.0–0.2)

## 2019-07-16 MED ORDER — METOPROLOL SUCCINATE ER 50 MG PO TB24: 50.0000 mg | ORAL_TABLET | Freq: Every day | ORAL | Status: DC

## 2019-07-16 MED ORDER — METOPROLOL SUCCINATE ER 50 MG PO TB24
50.0000 mg | ORAL_TABLET | Freq: Every day | ORAL | Status: DC
  Administered 2019-07-17 – 2019-07-19 (×2): 50 mg via ORAL
  Filled 2019-07-16 (×3): qty 1

## 2019-07-16 MED ORDER — VANCOMYCIN HCL IN DEXTROSE 1-5 GM/200ML-% IV SOLN
1000.0000 mg | INTRAVENOUS | Status: DC
  Administered 2019-07-16 – 2019-07-17 (×2): 1000 mg via INTRAVENOUS
  Filled 2019-07-16 (×3): qty 200

## 2019-07-16 NOTE — Progress Notes (Addendum)
[facility]  PROGRESS NOTE  Jonathon [name] MLY:[id number] DOB: [birth year] DOA: 07/15/2019 PCP: Aurea Graff, PA-C   Brief HPI:   69 year old male with history of hypertension, hyperlipidemia, hypothyroidism, diabetes mellitus type 2, diabetic neuropathy, general anxiety disorder, CKD stage III, gout, atrial fibrillation, PAD/DVT on Xarelto came to the ED for low blood pressure.  Patient went to his cardiologist office and where he was found to have low blood pressure and was tachycardic.  He was sent to ED for further evaluation and management.  EKG showed A. fib with RVR.  Patient has been having generalized fatigue and lethargy for past 2 to 3 weeks. He was admitted from 06/24/2019 to 07/01/2019 for atrial thromboembolic event to right lower extremity.  At that time patient underwent right popliteal, superficial femoral and tibial artery embolectomy by Dr. Oneida Alar.  At that time patient had gangrenous right toe, and plan was to follow-up as outpatient.  In the ED x-ray of the foot showed soft tissue air along the lateral aspect of first digit.  Patient started on IV vancomycin and Rocephin.  Vascular surgery was consulted    Subjective   Patient seen and examined, denies any pain.  Blood pressure has improved.   Assessment/Plan:     1. Sepsis-likely from infected right big toe, gangrene.  X-ray of the foot shows soft tissue air along the lateral aspect of first digit.  Patient started on vancomycin, ceftriaxone.  Vascular surgery has seen the patient.  Plan to recheck on Sunday if still has wet gangrene then to amputation on Monday. 2. Acute kidney injury on CKD stage III-patient baseline creatinine is 1.26.  He presented with creatinine of 2.24.  Started on IV normal saline.  Today creatinine has improved to 1.93.  Follow BMP in a.m. 3. Atrial fibrillation-heart rate is controlled, continue Xarelto for anticoagulation.  Metoprolol was held yesterday for hypotension.  Restart  metoprolol succinate at at lower-dose of 50 mg p.o. daily. 4. History of hypertension-hold Cozaar.  Restart metoprolol at lower dose as above. 5. Diabetes mellitus type 2-hold oral hypoglycemic agents, continue sliding scale insulin with NovoLog. 6. Diabetic neuropathy-continue Lyrica 7. Hyperlipidemia-continue statin and gemfibrozil. 8. Peripheral arterial disease/right lower extremity DVT-s/p right popliteal, superficial femoral and tibial artery embolectomy on 06/27/2019.  Continue aspirin, statin, Xarelto. 9. Chronic systolic CHF-recent EF 45 to 50%.  Euvolemic.  Chest x-ray shows no pulmonary edema.     SpO2: 100 %   COVID-19 Labs  No results for input(s): DDIMER, FERRITIN, LDH, CRP in the last 72 hours.  Lab Results  Component Value Date   SARSCOV2NAA NEGATIVE 07/15/2019   [place] NEGATIVE 06/24/2019   [place] NEGATIVE 06/08/2019     CBG: Recent Labs  Lab 07/15/19 1607 07/15/19 2148 07/16/19 0741 07/16/19 1127  GLUCAP 96 96 88 87    CBC: Recent Labs  Lab 07/15/19 1202 07/16/19 0345  WBC 5.2 5.7  NEUTROABS 4.0  --   HGB 15.3 15.2  HCT 47.5 47.0  MCV 98.3 98.1  PLT 147* 134*    Basic Metabolic Panel: Recent Labs  Lab 07/15/19 1202 07/15/19 1607 07/16/19 0345  NA 137  --  138  K 5.0  --  3.7  CL 103  --  103  CO2 22  --  21*  GLUCOSE 119*  --  89  BUN 24*  --  23  CREATININE 2.24*  --  1.93*  CALCIUM 9.1  --  8.8*  MG  --  1.1*  --  PHOS  --  3.0  --      Liver Function Tests: Recent Labs  Lab 07/15/19 1202  AST 54*  ALT 34  ALKPHOS 61  BILITOT 1.0  PROT 6.5  ALBUMIN 2.9*        DVT prophylaxis: Xarelto  Code Status: Full code  Family Communication: No family at bedside  Disposition Plan: Patient admitted with sepsis due to infected gangrenous right big toe.  Barrier to discharge-patient on IV antibiotics, vascular surgery following.  Might need amputation of right big toe.         Scheduled medications:  .  ALPRAZolam  0.25 mg Oral QHS  . aspirin EC  81 mg Oral Daily  . atorvastatin  40 mg Oral QHS  . gemfibrozil  600 mg Oral BID AC  . insulin aspart  0-15 Units Subcutaneous TID WC  . insulin aspart  0-5 Units Subcutaneous QHS  . levothyroxine  112 mcg Oral QHS  . oxybutynin  10 mg Oral QHS  . pantoprazole  40 mg Oral BID  . PARoxetine  40 mg Oral QHS  . pregabalin  150 mg Oral QHS  . rivaroxaban  20 mg Oral Q supper  . temazepam  15 mg Oral QHS    Consultants:  Vascular surgery  Procedures:    Antibiotics:   Anti-infectives (From admission, onward)   Start     Dose/Rate Route Frequency Ordered Stop   07/17/19 1330  vancomycin (VANCOREADY) IVPB 1750 mg/350 mL  Status:  Discontinued     1,750 mg 175 mL/hr over 120 Minutes Intravenous Every 48 hours 07/15/19 1426 07/16/19 1030   07/16/19 1400  vancomycin (VANCOCIN) IVPB 1000 mg/200 mL premix     1,000 mg 200 mL/hr over 60 Minutes Intravenous Every 24 hours 07/16/19 1030     07/16/19 1200  cefTRIAXone (ROCEPHIN) 1 g in sodium chloride 0.9 % 100 mL IVPB     1 g 200 mL/hr over 30 Minutes Intravenous Every 24 hours 07/15/19 1851     07/15/19 1230  vancomycin (VANCOREADY) IVPB 1750 mg/350 mL     1,750 mg 175 mL/hr over 120 Minutes Intravenous  Once 07/15/19 1219 07/15/19 1531   07/15/19 1215  vancomycin (VANCOCIN) IVPB 1000 mg/200 mL premix  Status:  Discontinued     1,000 mg 200 mL/hr over 60 Minutes Intravenous  Once 07/15/19 1203 07/15/19 1219   07/15/19 1215  cefTRIAXone (ROCEPHIN) 2 g in sodium chloride 0.9 % 100 mL IVPB     2 g 200 mL/hr over 30 Minutes Intravenous  Once 07/15/19 1203 07/15/19 1301       Objective   Vitals:   07/16/19 0638 07/16/19 0650 07/16/19 0743 07/16/19 1129  BP: 114/84  (!) 126/100 (!) 120/104  Pulse: (!) 108  89 (!) 102  Resp: 19  (!) 21 19  Temp: 97.6 F (36.4 C)  97.8 F (36.6 C) 97.8 F (36.6 C)  TempSrc: Oral  Oral Oral  SpO2: 100%  100% 100%  Weight:  103.3 kg    Height:         Intake/Output Summary (Last 24 hours) at 07/16/2019 1507 Last data filed at 07/16/2019 0654 Gross per 24 hour  Intake 392.35 ml  Output 900 ml  Net -507.65 ml    03/18 1901 - 03/20 0700 In: 1491.4 [I.V.:44.9] Out: 900 [Urine:900]  Filed Weights   07/15/19 1158 07/15/19 1616 07/16/19 0650  Weight: 76 kg 104.1 kg 103.3 kg    Physical Examination:  General-appears in no acute distress Heart-S1-S2, regular, no murmur auscultated Lungs-clear to auscultation bilaterally, no wheezing or crackles auscultated Abdomen-soft, nontender, no organomegaly Extremities-no edema in the lower extremities Neuro-alert, oriented x3, no focal deficit noted Skin-      Data Reviewed:   Recent Results (from the past 240 hour(s))  Blood Culture (routine x 2)     Status: None (Preliminary result)   Collection Time: 07/15/19 12:28 PM   Specimen: BLOOD RIGHT HAND  Result Value Ref Range Status   Specimen Description BLOOD RIGHT HAND  Final   Special Requests   Final    BOTTLES DRAWN AEROBIC AND ANAEROBIC Blood Culture results may not be optimal due to an inadequate volume of blood received in culture bottles   Culture   Final    NO GROWTH < 24 HOURS Performed at [facility], [address], [place], [place] [address]    Report Status PENDING  Incomplete  SARS CORONAVIRUS 2 (TAT 6-24 HRS) Nasopharyngeal Nasopharyngeal Swab     Status: None   Collection Time: 07/15/19  2:33 PM   Specimen: Nasopharyngeal Swab  Result Value Ref Range Status   SARS Coronavirus 2 NEGATIVE NEGATIVE Final    Comment: (NOTE) SARS-CoV-2 target nucleic acids are NOT DETECTED. The SARS-CoV-2 RNA is generally detectable in upper and lower respiratory specimens during the acute phase of infection. Negative results do not preclude SARS-CoV-2 infection, do not rule out co-infections with other pathogens, and should not be used as the sole basis for treatment or other patient management decisions. Negative  results must be combined with clinical observations, patient history, and epidemiological information. The expected result is Negative. Fact Sheet for Patients: SugarRoll.be Fact Sheet for Healthcare Providers: https://www.woods-mathews.com/ This test is not yet approved or cleared by the [place] FDA and  has been authorized for detection and/or diagnosis of SARS-CoV-2 by FDA under an Emergency Use Authorization (EUA). This EUA will remain  in effect (meaning this test can be used) for the duration of the COVID-19 declaration under Section 56 4(b)(1) of the Act, 21 U.S.C. section 360bbb-3(b)(1), unless the authorization is terminated or revoked sooner. Performed at [facility], [place] [address] [place],  [address]     No results for input(s): LIPASE, AMYLASE in the last 168 hours. No results for input(s): AMMONIA in the last 168 hours.  Cardiac Enzymes: No results for input(s): CKTOTAL, CKMB, CKMBINDEX, TROPONINI in the last 168 hours. BNP (last 3 results) No results for input(s): BNP in the last 8760 hours.  ProBNP (last 3 results) No results for input(s): PROBNP in the last 8760 hours.  Studies:  DG Chest Port 1 View  Result Date: 07/15/2019 CLINICAL DATA:  History of open wound. EXAM: PORTABLE CHEST 1 VIEW COMPARISON:  Chest x-ray 08/05/2017. FINDINGS: Mediastinum hilar structures normal. Lungs are clear. No pleural effusion or pneumothorax. Heart size stable. No acute bony abnormality identified. IMPRESSION: 1.  Cardiomegaly.  No pulmonary venous congestion. 2.  No acute pulmonary disease. Electronically Signed   By: Marcello Moores  Register   On: 07/15/2019 12:32   DG Toe Great Right  Result Date: 07/15/2019 CLINICAL DATA:  Open wound EXAM: RIGHT FIRST TOE: 3 V COMPARISON:  Right foot June 08, 2019 FINDINGS: Frontal, oblique, and lateral views were obtained. Soft tissue air is noted along the lateral aspect of the first  digit. There is no erosive change or bony destruction appreciable. No fracture or dislocation. There is slight narrowing of the first MTP  joint. Other joint spaces appear normal. IMPRESSION: Soft tissue air along the lateral aspect of the first digit. Mild osteoarthritic change first MTP joint. No bony destruction. No fracture or dislocation. Electronically Signed   By: Lowella Grip III M.D.   On: 07/15/2019 12:34     Admission status: The appropriate admission status for this patient is INPATIENT. Inpatient status is judged to be reasonable and necessary in order to provide the required intensity of service to ensure the patient's safety. The patient's presenting symptoms, physical exam findings, and initial radiographic and laboratory data in the context of their chronic comorbidities is felt to place them at high risk for further clinical deterioration. Furthermore, it is not anticipated that the patient will be medically stable for discharge from the hospital within 2 midnights of admission. The following factors support the admission status of inpatient.    The patient's presenting symptoms include hypotension The worrisome physical exam findings include hypertension. The initial radiographic and laboratory data are worrisome because of gangrene of the right  big toe The chronic co-morbidities include     * I certify that at the point of admission it is my clinical judgment that the patient will require inpatient hospital care spanning beyond 2 midnights from the point of admission due to high intensity of service, high risk for further deterioration and high frequency of surveillance required.Oswald Hillock   [facility] If 7PM-7AM, please contact night-coverage at [email or web address], Office  [phone]   07/16/2019, 3:07 PM  LOS: 1 day

## 2019-07-16 NOTE — Progress Notes (Signed)
Pharmacy Antibiotic Note  Jonathon [name] is a 69 y.o. male admitted on 07/15/2019 with sepsis and cellulitis.  Pharmacy has been consulted for vancomycin dosing.   WBC 5.7, LA 1.9>1.6, Scr improved from 2.24 to 1.93 today (CrCl ~44 mL/min), afebrile.   Plan: Ceftriaxone 1g IV every 24 hours Change to vancomycin 1000 mg IV Q 24hrs. Goal AUC 400-550. Expected AUC: 520 SCr used: 1.93 Monitor clinical progress, c/s, renal function F/u de-escalation plan/LOT, vancomycin levels as indicated   Height: 6' (182.9 cm) Weight: 227 lb 11.2 oz (103.3 kg) IBW/kg (Calculated) : 77.6  Temp (24hrs), Avg:98 F (36.7 C), Min:97.6 F (36.4 C), Max:98.6 F (37 C)  Recent Labs  Lab 07/15/19 1202 07/15/19 1607 07/16/19 0345  WBC 5.2  --  5.7  CREATININE 2.24*  --  1.93*  LATICACIDVEN 1.9 1.6  --     Estimated Creatinine Clearance: 44.9 mL/min (A) (by C-G formula based on SCr of 1.93 mg/dL (H)).    Allergies  Allergen Reactions  . Morphine And Related Itching    Antimicrobials this admission: 3/19 vancomycin >>  3/19 ceftriaxone x 1   Dose adjustments this admission: 3/20 Scr improved to 1.93, adjusted to vanc 1g IV q24   Microbiology results: Bcx 3/19: NGTD   Antonietta Jewel, PharmD, BCCCP Clinical Pharmacist  Phone: [phone]  Please check AMION for all [place] phone numbers After 10:00 PM, call [place] [phone] 07/16/2019 10:28 AM

## 2019-07-17 DIAGNOSIS — E1129 Type 2 diabetes mellitus with other diabetic kidney complication: Secondary | ICD-10-CM

## 2019-07-17 DIAGNOSIS — I96 Gangrene, not elsewhere classified: Secondary | ICD-10-CM

## 2019-07-17 LAB — CBC
HCT: 41.5 % (ref 39.0–52.0)
Hemoglobin: 13.8 g/dL (ref 13.0–17.0)
MCH: 31.1 pg (ref 26.0–34.0)
MCHC: 33.3 g/dL (ref 30.0–36.0)
MCV: 93.5 fL (ref 80.0–100.0)
Platelets: UNDETERMINED 10*3/uL (ref 150–400)
RBC: 4.44 MIL/uL (ref 4.22–5.81)
RDW: 13.1 % (ref 11.5–15.5)
WBC: 6.5 10*3/uL (ref 4.0–10.5)
nRBC: 0 % (ref 0.0–0.2)

## 2019-07-17 LAB — BASIC METABOLIC PANEL WITH GFR
Anion gap: 13 (ref 5–15)
BUN: 16 mg/dL (ref 8–23)
CO2: 20 mmol/L — ABNORMAL LOW (ref 22–32)
Calcium: 8.3 mg/dL — ABNORMAL LOW (ref 8.9–10.3)
Chloride: 104 mmol/L (ref 98–111)
Creatinine, Ser: 1.51 mg/dL — ABNORMAL HIGH (ref 0.61–1.24)
GFR calc Af Amer: 54 mL/min — ABNORMAL LOW
GFR calc non Af Amer: 46 mL/min — ABNORMAL LOW
Glucose, Bld: 85 mg/dL (ref 70–99)
Potassium: 3.3 mmol/L — ABNORMAL LOW (ref 3.5–5.1)
Sodium: 137 mmol/L (ref 135–145)

## 2019-07-17 LAB — GLUCOSE, CAPILLARY
Glucose-Capillary: 83 mg/dL (ref 70–99)
Glucose-Capillary: 78 mg/dL (ref 70–99)
Glucose-Capillary: 115 mg/dL — ABNORMAL HIGH (ref 70–99)
Glucose-Capillary: 117 mg/dL — ABNORMAL HIGH (ref 70–99)

## 2019-07-17 LAB — MAGNESIUM: Magnesium: 1.8 mg/dL (ref 1.7–2.4)

## 2019-07-17 MED ORDER — MAGNESIUM SULFATE 4 GM/100ML IV SOLN
4.0000 g | Freq: Once | INTRAVENOUS | Status: AC
  Administered 2019-07-17: 4 g via INTRAVENOUS
  Filled 2019-07-17: qty 100

## 2019-07-17 MED ORDER — RIVAROXABAN 20 MG PO TABS: 20.0000 mg | ORAL_TABLET | Freq: Every day | ORAL | Status: DC

## 2019-07-17 MED ORDER — POTASSIUM CHLORIDE CRYS ER 20 MEQ PO TBCR
30.0000 meq | EXTENDED_RELEASE_TABLET | Freq: Once | ORAL | Status: AC
  Administered 2019-07-17: 30 meq via ORAL
  Filled 2019-07-17: qty 1

## 2019-07-17 NOTE — Progress Notes (Addendum)
Pt seen by Dr. Oneida Alar today and right great toe with worsening gangrene.  For OR tomorrow for right 1st toe amputation.  Pre-op orders placed.  NPO after MN, consent and labs.   Xarelto on hold and ordered to restart Tuesday afternoon at 1700.     Leontine Locket, [facility] 07/17/2019 10:35 AM  Pt right first toe more wet appearance and smell Will proceed with first toe amp tomorrow Will hold Xarelto today and tomorrow   [name], MD [facility] Office: [phone]

## 2019-07-17 NOTE — Progress Notes (Signed)
[facility]  PROGRESS NOTE  Jonathon [name] JSH:[id number] DOB: June 27, 1950 DOA: 07/15/2019 PCP: Aurea Graff, PA-C   Brief HPI:   69 year old male with history of hypertension, hyperlipidemia, hypothyroidism, diabetes mellitus type 2, diabetic neuropathy, general anxiety disorder, CKD stage III, gout, atrial fibrillation, PAD/DVT on Xarelto came to the ED for low blood pressure.  Patient went to his cardiologist office and where he was found to have low blood pressure and was tachycardic.  He was sent to ED for further evaluation and management.  EKG showed A. fib with RVR.  Patient has been having generalized fatigue and lethargy for past 2 to 3 weeks. He was admitted from 06/24/2019 to 07/01/2019 for atrial thromboembolic event to right lower extremity.  At that time patient underwent right popliteal, superficial femoral and tibial artery embolectomy by Dr. Oneida Alar.  At that time patient had gangrenous right toe, and plan was to follow-up as outpatient.  In the ED x-ray of the foot showed soft tissue air along the lateral aspect of first digit.  Patient started on IV vancomycin and Rocephin.  Vascular surgery was consulted    Subjective   Patient seen and examined, denies any pain.   Assessment/Plan:   1. Sepsis-likely from infected right big toe, gangrene.  X-ray of the foot shows soft tissue air along the lateral aspect of first digit. Patient started on vancomycin, ceftriaxone. Vascular surgery has seen the patient.  Vascular surgery is planning to take to the OR tomorrow for right first toe amputation 2. Acute kidney injury on CKD stage III-patient baseline creatinine is 1.26.  He presented with creatinine of 2.24.  Started on IV normal saline.  Today creatinine has improved to 1.93.  Follow BMP in a.m. 3. Atrial fibrillation-heart rate is controlled, continue Xarelto for anticoagulation.  Metoprolol was held yesterday for hypotension.  Restart metoprolol succinate at at  lower-dose of 50 mg p.o. daily. 4. History of hypertension-hold Cozaar.  Restart metoprolol at lower dose as above. 5. Diabetes mellitus type 2-hold oral hypoglycemic agents, continue sliding scale insulin with NovoLog. 6. Diabetic neuropathy-continue Lyrica 7. Hyperlipidemia-continue statin and gemfibrozil. 8. Peripheral arterial disease/right lower extremity DVT-s/p right popliteal, superficial femoral and tibial artery embolectomy on 06/27/2019. Continue aspirin, statin, Xarelto. 9. Chronic systolic CHF-recent EF 45 to 50%.  Euvolemic.  Chest x-ray shows no pulmonary edema.   SpO2: 98 %   COVID-19 Labs  No results for input(s): DDIMER, FERRITIN, LDH, CRP in the last 72 hours.  Lab Results  Component Value Date   SARSCOV2NAA NEGATIVE 07/15/2019   [place] NEGATIVE 06/24/2019   [place] NEGATIVE 06/08/2019     CBG: Recent Labs  Lab 07/16/19 1127 07/16/19 1657 07/16/19 2107 07/17/19 0744 07/17/19 1149  GLUCAP 87 92 112* 83 78    CBC: Recent Labs  Lab 07/15/19 1202 07/16/19 0345 07/17/19 0329  WBC 5.2 5.7 6.5  NEUTROABS 4.0  --   --   HGB 15.3 15.2 13.8  HCT 47.5 47.0 41.5  MCV 98.3 98.1 93.5  PLT 147* 134* PLATELET CLUMPS NOTED ON SMEAR, UNABLE TO ESTIMATE    Basic Metabolic Panel: Recent Labs  Lab 07/15/19 1202 07/15/19 1607 07/16/19 0345 07/17/19 0329  NA 137  --  138 137  K 5.0  --  3.7 3.3*  CL 103  --  103 104  CO2 22  --  21* 20*  GLUCOSE 119*  --  89 85  BUN 24*  --  23 16  CREATININE 2.24*  --  1.93*  1.51*  CALCIUM 9.1  --  8.8* 8.3*  MG  --  1.1*  --   --   PHOS  --  3.0  --   --      Liver Function Tests: Recent Labs  Lab 07/15/19 1202  AST 54*  ALT 34  ALKPHOS 61  BILITOT 1.0  PROT 6.5  ALBUMIN 2.9*     DVT prophylaxis: Xarelto  Code Status: Full code  Family Communication: No family at bedside  Disposition Plan: Patient admitted with sepsis due to infected gangrenous right big toe.  Barrier to discharge-patient on  IV antibiotics, vascular surgery following.  Might need amputation of right big toe.     Scheduled medications:  . ALPRAZolam  0.25 mg Oral QHS  . aspirin EC  81 mg Oral Daily  . atorvastatin  40 mg Oral QHS  . gemfibrozil  600 mg Oral BID AC  . insulin aspart  0-15 Units Subcutaneous TID WC  . insulin aspart  0-5 Units Subcutaneous QHS  . levothyroxine  112 mcg Oral QHS  . metoprolol succinate  50 mg Oral Daily  . oxybutynin  10 mg Oral QHS  . pantoprazole  40 mg Oral BID  . PARoxetine  40 mg Oral QHS  . pregabalin  150 mg Oral QHS  . [START ON 07/19/2019] rivaroxaban  20 mg Oral Q supper  . temazepam  15 mg Oral QHS    Consultants:  Vascular surgery   Antibiotics:   Anti-infectives (From admission, onward)   Start     Dose/Rate Route Frequency Ordered Stop   07/17/19 1330  vancomycin (VANCOREADY) IVPB 1750 mg/350 mL  Status:  Discontinued     1,750 mg 175 mL/hr over 120 Minutes Intravenous Every 48 hours 07/15/19 1426 07/16/19 1030   07/16/19 1400  vancomycin (VANCOCIN) IVPB 1000 mg/200 mL premix     1,000 mg 200 mL/hr over 60 Minutes Intravenous Every 24 hours 07/16/19 1030     07/16/19 1200  cefTRIAXone (ROCEPHIN) 1 g in sodium chloride 0.9 % 100 mL IVPB     1 g 200 mL/hr over 30 Minutes Intravenous Every 24 hours 07/15/19 1851     07/15/19 1230  vancomycin (VANCOREADY) IVPB 1750 mg/350 mL     1,750 mg 175 mL/hr over 120 Minutes Intravenous  Once 07/15/19 1219 07/15/19 1531   07/15/19 1215  vancomycin (VANCOCIN) IVPB 1000 mg/200 mL premix  Status:  Discontinued     1,000 mg 200 mL/hr over 60 Minutes Intravenous  Once 07/15/19 1203 07/15/19 1219   07/15/19 1215  cefTRIAXone (ROCEPHIN) 2 g in sodium chloride 0.9 % 100 mL IVPB     2 g 200 mL/hr over 30 Minutes Intravenous  Once 07/15/19 1203 07/15/19 1301       Objective   Vitals:   07/17/19 0639 07/17/19 0748 07/17/19 0945 07/17/19 1150  BP: (!) 122/91  (!) 118/95   Pulse: 94 (!) 124 (!) 139 (!) 113  Resp:  20 (!) 21  19  Temp: 98.4 F (36.9 C) 98.3 F (36.8 C)  98.5 F (36.9 C)  TempSrc: Oral Oral  Oral  SpO2: 97% 98%    Weight:      Height:        Intake/Output Summary (Last 24 hours) at 07/17/2019 1417 Last data filed at 07/17/2019 0548 Gross per 24 hour  Intake 4096.39 ml  Output 950 ml  Net 3146.39 ml    03/19 1901 - 03/21 0700 In: 4096.4 [P.O.:360; I.V.:3468]  Out: 1850 [Urine:1850]  Filed Weights   07/15/19 1158 07/15/19 1616 07/16/19 0650  Weight: 76 kg 104.1 kg 103.3 kg    Physical Examination:   General-appears in no acute distress Heart-S1-S2, regular, no murmur auscultated Lungs-clear to auscultation bilaterally, no wheezing or crackles auscultated Abdomen-soft, nontender, no organomegaly Extremities-no edema in the lower extremities Neuro-alert, oriented x3, no focal deficit noted  Skin-      Data Reviewed:   Recent Results (from the past 240 hour(s))  Blood Culture (routine x 2)     Status: None (Preliminary result)   Collection Time: 07/15/19 12:28 PM   Specimen: BLOOD RIGHT HAND  Result Value Ref Range Status   Specimen Description BLOOD RIGHT HAND  Final   Special Requests   Final    BOTTLES DRAWN AEROBIC AND ANAEROBIC Blood Culture results may not be optimal due to an inadequate volume of blood received in culture bottles   Culture   Final    NO GROWTH 2 DAYS Performed at [facility], [place] [address] [place], [place] [address]    Report Status PENDING  Incomplete  SARS CORONAVIRUS 2 (TAT 6-24 HRS) Nasopharyngeal Nasopharyngeal Swab     Status: None   Collection Time: 07/15/19  2:33 PM   Specimen: Nasopharyngeal Swab  Result Value Ref Range Status   SARS Coronavirus 2 NEGATIVE NEGATIVE Final    Comment: (NOTE) SARS-CoV-2 target nucleic acids are NOT DETECTED. The SARS-CoV-2 RNA is generally detectable in upper and lower respiratory specimens during the acute phase of infection. Negative results do not preclude SARS-CoV-2 infection,  do not rule out co-infections with other pathogens, and should not be used as the sole basis for treatment or other patient management decisions. Negative results must be combined with clinical observations, patient history, and epidemiological information. The expected result is Negative. Fact Sheet for Patients: SugarRoll.be Fact Sheet for Healthcare Providers: https://www.woods-mathews.com/ This test is not yet approved or cleared by the [place] FDA and  has been authorized for detection and/or diagnosis of SARS-CoV-2 by FDA under an Emergency Use Authorization (EUA). This EUA will remain  in effect (meaning this test can be used) for the duration of the COVID-19 declaration under Section 56 4(b)(1) of the Act, 21 U.S.C. section 360bbb-3(b)(1), unless the authorization is terminated or revoked sooner. Performed at [facility], [place] [address] [place], [place] [address]       Admission status: The appropriate admission status for this patient is INPATIENT. Inpatient status is judged to be reasonable and necessary in order to provide the required intensity of service to ensure the patient's safety. The patient's presenting symptoms, physical exam findings, and initial radiographic and laboratory data in the context of their chronic comorbidities is felt to place them at high risk for further clinical deterioration. Furthermore, it is not anticipated that the patient will be medically stable for discharge from the hospital within 2 midnights of admission. The following factors support the admission status of inpatient.    The patient's presenting symptoms include hypotension The worrisome physical exam findings include hypertension. The initial radiographic and laboratory data are worrisome because of gangrene of the right  big toe The chronic co-morbidities include     * I certify that at the point of admission it is my clinical  judgment that the patient will require inpatient hospital care spanning beyond 2 midnights from the point of admission due to high intensity of service, high risk for further deterioration and high frequency  of surveillance required.Oswald Hillock   [facility] If 7PM-7AM, please contact night-coverage at [email or web address], Office  [phone]   07/17/2019, 2:17 PM  LOS: 2 days

## 2019-07-18 ENCOUNTER — Encounter (HOSPITAL_COMMUNITY): Payer: Self-pay | Admitting: Internal Medicine

## 2019-07-18 ENCOUNTER — Encounter (HOSPITAL_COMMUNITY)
Admission: EM | Disposition: A | Discharge status: Home Health | Payer: Self-pay | Source: Ambulatory Visit | Attending: Family Medicine

## 2019-07-18 ENCOUNTER — Inpatient Hospital Stay (HOSPITAL_COMMUNITY): Payer: Medicare Other

## 2019-07-18 DIAGNOSIS — I96 Gangrene, not elsewhere classified: Secondary | ICD-10-CM

## 2019-07-18 HISTORY — PX: AMPUTATION: SHX166

## 2019-07-18 LAB — URINALYSIS, ROUTINE W REFLEX MICROSCOPIC
Bilirubin Urine: NEGATIVE
Glucose, UA: >=500 mg/dL — AB
Ketones, ur: 5 mg/dL — AB
Nitrite: NEGATIVE
Protein, ur: 100 mg/dL — AB
Specific Gravity, Urine: 1.015 (ref 1.005–1.030)
WBC, UA: >50 WBC/hpf — ABNORMAL HIGH (ref 0–5)
pH: 5 (ref 5.0–8.0)

## 2019-07-18 LAB — GLUCOSE, CAPILLARY
Glucose-Capillary: 95 mg/dL (ref 70–99)
Glucose-Capillary: 86 mg/dL (ref 70–99)
Glucose-Capillary: 91 mg/dL (ref 70–99)
Glucose-Capillary: 86 mg/dL (ref 70–99)
Glucose-Capillary: 166 mg/dL — ABNORMAL HIGH (ref 70–99)

## 2019-07-18 LAB — PROTIME-INR
INR: 1.9 — ABNORMAL HIGH (ref 0.8–1.2)
Prothrombin Time: 21.4 seconds — ABNORMAL HIGH (ref 11.4–15.2)

## 2019-07-18 LAB — BASIC METABOLIC PANEL
Anion gap: 12 (ref 5–15)
BUN: 12 mg/dL (ref 8–23)
CO2: 22 mmol/L (ref 22–32)
Calcium: 7.9 mg/dL — ABNORMAL LOW (ref 8.9–10.3)
Chloride: 105 mmol/L (ref 98–111)
Creatinine, Ser: 1.22 mg/dL (ref 0.61–1.24)
GFR calc Af Amer: >60 mL/min (ref >60)
GFR calc non Af Amer: >60 mL/min (ref >60)
Glucose, Bld: 91 mg/dL (ref 70–99)
Potassium: 3 mmol/L — ABNORMAL LOW (ref 3.5–5.1)
Sodium: 139 mmol/L (ref 135–145)

## 2019-07-18 LAB — RAPID URINE DRUG SCREEN, HOSP PERFORMED
Amphetamines: NOT DETECTED
Barbiturates: NOT DETECTED
Benzodiazepines: POSITIVE — AB
Cocaine: NOT DETECTED
Opiates: NOT DETECTED
Tetrahydrocannabinol: POSITIVE — AB

## 2019-07-18 LAB — CBC
HCT: 40.7 % (ref 39.0–52.0)
Hemoglobin: 13.8 g/dL (ref 13.0–17.0)
MCH: 31.7 pg (ref 26.0–34.0)
MCHC: 33.9 g/dL (ref 30.0–36.0)
MCV: 93.3 fL (ref 80.0–100.0)
Platelets: 115 10*3/uL — ABNORMAL LOW (ref 150–400)
RBC: 4.36 MIL/uL (ref 4.22–5.81)
RDW: 13 % (ref 11.5–15.5)
WBC: 4.9 10*3/uL (ref 4.0–10.5)
nRBC: 0 % (ref 0.0–0.2)

## 2019-07-18 SURGERY — AMPUTATION DIGIT
Anesthesia: General | Site: Toe | Laterality: Right

## 2019-07-18 MED ORDER — LIDOCAINE 2% (20 MG/ML) 5 ML SYRINGE
INTRAMUSCULAR | Status: DC | PRN
  Administered 2019-07-18: 50 mg via INTRAVENOUS
  Administered 2019-07-18: 25 mg via INTRAVENOUS

## 2019-07-18 MED ORDER — FENTANYL CITRATE (PF) 250 MCG/5ML IJ SOLN
INTRAMUSCULAR | Status: AC
  Filled 2019-07-18: qty 5

## 2019-07-18 MED ORDER — ONDANSETRON HCL 4 MG/2ML IJ SOLN: 4.0000 mg | Freq: Once | INTRAMUSCULAR | Status: DC | PRN

## 2019-07-18 MED ORDER — METOPROLOL SUCCINATE ER 25 MG PO TB24
ORAL_TABLET | ORAL | Status: AC
  Administered 2019-07-18: 50 mg via ORAL
  Filled 2019-07-18: qty 4

## 2019-07-18 MED ORDER — LACTATED RINGERS IV SOLN: INTRAVENOUS | Status: DC | PRN

## 2019-07-18 MED ORDER — METOPROLOL SUCCINATE ER 25 MG PO TB24: 50.0000 mg | ORAL_TABLET | Freq: Once | ORAL | Status: DC

## 2019-07-18 MED ORDER — METOPROLOL SUCCINATE ER 25 MG PO TB24: 100.0000 mg | ORAL_TABLET | Freq: Once | ORAL | Status: DC

## 2019-07-18 MED ORDER — FENOFIBRATE 54 MG PO TABS
54.0000 mg | ORAL_TABLET | Freq: Every day | ORAL | Status: DC
  Administered 2019-07-19: 54 mg via ORAL
  Filled 2019-07-18: qty 1

## 2019-07-18 MED ORDER — PROPOFOL 10 MG/ML IV BOLUS
INTRAVENOUS | Status: DC | PRN
  Administered 2019-07-18: 50 mg via INTRAVENOUS
  Administered 2019-07-18: 150 mg via INTRAVENOUS

## 2019-07-18 MED ORDER — ACETAMINOPHEN 10 MG/ML IV SOLN: 1000.0000 mg | Freq: Once | INTRAVENOUS | Status: DC | PRN

## 2019-07-18 MED ORDER — FENTANYL CITRATE (PF) 100 MCG/2ML IJ SOLN: 25.0000 ug | INTRAMUSCULAR | Status: DC | PRN

## 2019-07-18 MED ORDER — MIDAZOLAM HCL 5 MG/5ML IJ SOLN
INTRAMUSCULAR | Status: DC | PRN
  Administered 2019-07-18 (×2): 1 mg via INTRAVENOUS

## 2019-07-18 MED ORDER — MIDAZOLAM HCL 2 MG/2ML IJ SOLN
INTRAMUSCULAR | Status: AC
  Filled 2019-07-18: qty 2

## 2019-07-18 MED ORDER — PHENYLEPHRINE 40 MCG/ML (10ML) SYRINGE FOR IV PUSH (FOR BLOOD PRESSURE SUPPORT)
PREFILLED_SYRINGE | INTRAVENOUS | Status: DC | PRN
  Administered 2019-07-18 (×4): 80 ug via INTRAVENOUS

## 2019-07-18 MED ORDER — ROCURONIUM BROMIDE 10 MG/ML (PF) SYRINGE
PREFILLED_SYRINGE | INTRAVENOUS | Status: AC
  Filled 2019-07-18: qty 10

## 2019-07-18 MED ORDER — VANCOMYCIN HCL 1500 MG/300ML IV SOLN
1500.0000 mg | INTRAVENOUS | Status: DC
  Administered 2019-07-18: 1500 mg via INTRAVENOUS
  Filled 2019-07-18 (×2): qty 300

## 2019-07-18 MED ORDER — PHENYLEPHRINE 40 MCG/ML (10ML) SYRINGE FOR IV PUSH (FOR BLOOD PRESSURE SUPPORT)
PREFILLED_SYRINGE | INTRAVENOUS | Status: AC
  Filled 2019-07-18: qty 20

## 2019-07-18 MED ORDER — 0.9 % SODIUM CHLORIDE (POUR BTL) OPTIME
TOPICAL | Status: DC | PRN
  Administered 2019-07-18: 10:00:00 1000 mL

## 2019-07-18 MED ORDER — DEXAMETHASONE SODIUM PHOSPHATE 10 MG/ML IJ SOLN
INTRAMUSCULAR | Status: DC | PRN
  Administered 2019-07-18: 4 mg via INTRAVENOUS

## 2019-07-18 MED ORDER — ONDANSETRON HCL 4 MG/2ML IJ SOLN
INTRAMUSCULAR | Status: DC | PRN
  Administered 2019-07-18: 4 mg via INTRAVENOUS

## 2019-07-18 MED ORDER — PROPOFOL 10 MG/ML IV BOLUS
INTRAVENOUS | Status: AC
  Filled 2019-07-18: qty 20

## 2019-07-18 MED ORDER — FENTANYL CITRATE (PF) 250 MCG/5ML IJ SOLN
INTRAMUSCULAR | Status: DC | PRN
  Administered 2019-07-18: 50 ug via INTRAVENOUS

## 2019-07-18 MED ORDER — POTASSIUM CHLORIDE 10 MEQ/100ML IV SOLN
10.0000 meq | INTRAVENOUS | Status: AC
  Administered 2019-07-18 (×3): 10 meq via INTRAVENOUS
  Filled 2019-07-18 (×3): qty 100

## 2019-07-18 SURGICAL SUPPLY — 32 items
BNDG ELASTIC 4X5.8 VLCR STR LF (GAUZE/BANDAGES/DRESSINGS) ×2 IMPLANT
BNDG GAUZE ELAST 4 BULKY (GAUZE/BANDAGES/DRESSINGS) ×2 IMPLANT
CANISTER SUCT 3000ML PPV (MISCELLANEOUS) ×2 IMPLANT
COVER SURGICAL LIGHT HANDLE (MISCELLANEOUS) ×2 IMPLANT
COVER WAND RF STERILE (DRAPES) ×2 IMPLANT
DRAPE EXTREMITY T 121X128X90 (DISPOSABLE) ×2 IMPLANT
DRAPE HALF SHEET 40X57 (DRAPES) ×2 IMPLANT
DRSG EMULSION OIL 3X3 NADH (GAUZE/BANDAGES/DRESSINGS) ×2 IMPLANT
ELECT REM PT RETURN 9FT ADLT (ELECTROSURGICAL) ×2
ELECTRODE REM PT RTRN 9FT ADLT (ELECTROSURGICAL) ×1 IMPLANT
GAUZE SPONGE 4X4 12PLY STRL (GAUZE/BANDAGES/DRESSINGS) ×2 IMPLANT
GLOVE BIO SURGEON STRL SZ7.5 (GLOVE) ×4 IMPLANT
GLOVE BIOGEL PI IND STRL 8 (GLOVE) ×2 IMPLANT
GLOVE BIOGEL PI IND STRL 8.5 (GLOVE) ×1 IMPLANT
GLOVE BIOGEL PI INDICATOR 8 (GLOVE) ×2
GLOVE BIOGEL PI INDICATOR 8.5 (GLOVE) ×1
GOWN STRL REUS W/ TWL LRG LVL3 (GOWN DISPOSABLE) ×2 IMPLANT
GOWN STRL REUS W/ TWL XL LVL3 (GOWN DISPOSABLE) ×2 IMPLANT
GOWN STRL REUS W/TWL LRG LVL3 (GOWN DISPOSABLE) ×2
GOWN STRL REUS W/TWL XL LVL3 (GOWN DISPOSABLE) ×2
KIT BASIN OR (CUSTOM PROCEDURE TRAY) ×2 IMPLANT
KIT TURNOVER KIT B (KITS) ×2 IMPLANT
NS IRRIG 1000ML POUR BTL (IV SOLUTION) ×2 IMPLANT
PACK GENERAL/GYN (CUSTOM PROCEDURE TRAY) ×2 IMPLANT
PAD ARMBOARD 7.5X6 YLW CONV (MISCELLANEOUS) ×4 IMPLANT
SUT ETHILON 2 0 FS 18 (SUTURE) ×6 IMPLANT
SUT ETHILON 3 0 PS 1 (SUTURE) ×2 IMPLANT
SUT VIC AB 3-0 SH 27 (SUTURE)
SUT VIC AB 3-0 SH 27X BRD (SUTURE) IMPLANT
TOWEL GREEN STERILE (TOWEL DISPOSABLE) ×4 IMPLANT
UNDERPAD 30X30 (UNDERPADS AND DIAPERS) ×2 IMPLANT
WATER STERILE IRR 1000ML POUR (IV SOLUTION) ×2 IMPLANT

## 2019-07-18 NOTE — Progress Notes (Signed)
Per Dr. Roanna Banning,  Give 50mg  Metoprolol XL, prior to surgery.  BP 144/91 HR 97-110

## 2019-07-18 NOTE — Progress Notes (Signed)
[facility]  Subjective  - no complaints.   Objective (!) 124/96 92 98.3 F (36.8 C) (Oral) 20 98%  Intake/Output Summary (Last 24 hours) at 07/18/2019 0857 Last data filed at 07/18/2019 0750 Gross per 24 hour  Intake 1187.98 ml  Output 1225 ml  Net -37.02 ml    Right great toe gangrene.  Laboratory Lab Results: Recent Labs    07/16/19 0345 07/17/19 0329  WBC 5.7 6.5  HGB 15.2 13.8  HCT 47.0 41.5  PLT 134* PLATELET CLUMPS NOTED ON SMEAR, UNABLE TO ESTIMATE   BMET Recent Labs    07/17/19 0329 07/18/19 0604  NA 137 139  K 3.3* 3.0*  CL 104 105  CO2 20* 22  GLUCOSE 85 91  BUN 16 12  CREATININE 1.51* 1.22  CALCIUM 8.3* 7.9*    COAG Lab Results  Component Value Date   INR 1.9 (H) 07/18/2019   No results found for: PTT  Assessment/Planning:  Plan right great toe amputation after risks and benefits discussed.    [name] 07/18/2019 8:57 AM --

## 2019-07-18 NOTE — Op Note (Signed)
Date: July 18, 2019  Preoperative diagnosis: Gangrene of right great toe  Postoperative diagnosis: Same  Procedure: Right great toe amputation  Surgeon: Dr. [name], MD  Assistant: OR staff  Indications: Patient is a 69 year old male who was initially evaluated by Dr. Oneida Alar with ischemia of the right foot.  He ultimately underwent right lower extremity thrombectomy including the SFA popliteal and tibial arteries.  Ultimately has had progression of gangrene to the right great toe and presents for toe amputation after risk benefits are discussed.  Findings: Right great toe amputation with preservation of the metatarsal head.  Tissue appeared healthy with good blood flow.  Anesthesia: LMA  Details: Patient was taken to the operating room after informed consent was obtained.  Placed on operative table in supine position.  After anesthesia was induced his right leg was then prepped and draped in usual sterile fashion.  Ultimately used a marking pen and marked out a fishmouth incision at the base of the right great toe in order to resect all the necrotic tissue and get back to healthy tissue margins.  Ultimately 15 blade scalpel was used to make the incision and then the phalanges was transected with a bone cutter.  I then took additional phalanges bone back with rongeur.  Ultimately the tissue had brisk bleeding.  The wound was copiously irrigated with saline until effluent was clear.  I used Bovie to get hemostasis.  The tissue was then reapproximated loosely with 2-0 nylons in interrupted fashion.  Dry sterile dressings were applied.  Taken to the PACU in stable condition.  Complication: None  Condition: Stable  [name], MD [facility] Office: [place]

## 2019-07-18 NOTE — Progress Notes (Signed)
Patient sent down to Short Stay with silver necklace (fish charm).   Floor called, spoke with unit secretary.  Asked them to come get necklace.

## 2019-07-18 NOTE — Consult Note (Signed)
WOC consulted for grangrenous toe. VVS following. To OR today for amputation. .  Re consult if needed, will not follow at this time. Thanks  Miana Politte [employer], RN,CWOCN, CNS, [place] [phone])

## 2019-07-18 NOTE — Transfer of Care (Signed)
Immediate Anesthesia Transfer of Care Note  Patient: Jonathon [name]  Procedure(s) Performed: AMPUTATION RIGHT GREAT TOE (Right Toe)  Patient Location: PACU  Anesthesia Type:General  Level of Consciousness: drowsy  Airway & Oxygen Therapy: Patient Spontanous Breathing and Patient connected to nasal cannula oxygen  Post-op Assessment: Report given to RN and Post -op Vital signs reviewed and stable  Post vital signs: Reviewed and stable  Last Vitals:  Vitals Value Taken Time  BP 113/74 07/18/19 1029  Temp    Pulse 109 07/18/19 1030  Resp 14 07/18/19 1030  SpO2 91 % 07/18/19 1030  Vitals shown include unvalidated device data.  Last Pain:  Vitals:   07/18/19 0849  TempSrc:   PainSc: 0-No pain         Complications: No apparent anesthesia complications

## 2019-07-18 NOTE — Progress Notes (Signed)
Pharmacy Antibiotic Note  Jonathon [name] is a 69 y.o. male admitted on 07/15/2019 with sepsis and cellulitis.  Pharmacy has been consulted for vancomycin dosing (day 4 of antibiotics). He is noted s/p toe amputation.  -WBC= 4.9, afeb, cultures- ngtd -SCr= 1.22 (trend down)   Plan: -Ceftriaxone 1g IV every 24 hours -Change to vancomycin 1500 mg IV Q 24hrs. Goal AUC 400-550; Expected AUC: 515; SCr used: 1.2 -Monitor clinical progress, c/s, renal function -Will follow plans for length of therapy   Height: 6' (182.9 cm) Weight: 226 lb 4.8 oz (102.6 kg) IBW/kg (Calculated) : 77.6  Temp (24hrs), Avg:98.2 F (36.8 C), Min:97.9 F (36.6 C), Max:98.4 F (36.9 C)  Recent Labs  Lab 07/15/19 1202 07/15/19 1607 07/16/19 0345 07/17/19 0329 07/18/19 0604  WBC 5.2  --  5.7 6.5 4.9  CREATININE 2.24*  --  1.93* 1.51* 1.22  LATICACIDVEN 1.9 1.6  --   --   --     Estimated Creatinine Clearance: 70.8 mL/min (by C-G formula based on SCr of 1.22 mg/dL).    Allergies  Allergen Reactions  . Morphine And Related Itching    Antimicrobials this admission: 3/19 vancomycin >>  3/19 ceftriaxone x 1   Dose adjustments this admission: 3/20 Scr improved to 1.93, adjusted to vanc 1g IV q24   Microbiology results: Bcx 3/19: NGTD   Antonietta Jewel, PharmD, BCCCP Clinical Pharmacist  Phone: [phone]  Please check AMION for all [place] phone numbers After 10:00 PM, call [place] [phone] 07/18/2019 3:11 PM

## 2019-07-18 NOTE — TOC Initial Note (Signed)
Transition of Care [facility]) - Initial/Assessment Note    Patient Details  Name: Jonathon [name] MRN: [id number] Date of Birth: [birth year]  Transition of Care [facility]) CM/SW Contact:    Sharin Mons, RN Phone Number: [phone] 07/18/2019, 3:01 PM  Clinical Narrative:    Readmitted with SIRS / gangrenuous R great toe, hx of HTN, hyperlipidemia, hypothyroidism, DM, diabetic neuropathy, generalized anxiety disorder, chronic kidney disease stage III, gout, A. fib, PAD/DVT on Xarelto. Previous admit, s/p  RLE embolectomy. From home with wife. PTA independent with ADL'S, no DME usage.        - S/P AMPUTATION RIGHT GREAT TOE (Right Toe), 07/18/2019 NCM requested  PT evaluation from MD.... TOC  Team to  f/u.  Pt with high readmission risk score. Pt would probably benefit from [facility] services. Wife and pt agreeable to home health services ( RN, PT). Medicare [place] star rating provided to wife... TOC team to f/u with selection for [facility] services pending MD orders.    Expected Discharge Plan: Home/Self Care(vs  home health services) Barriers to Discharge: Continued Medical Work up   Patient Goals and CMS Choice Patient states their goals for this hospitalization and ongoing recovery are:: to be able to walk and get rid of infection   Choice offered to / list presented to : Patient  Expected Discharge Plan and Services Expected Discharge Plan: Home/Self Care(vs  home health services)   Discharge Planning Services: CM Consult   Living arrangements for the past 2 months: Single Family Home                                      Prior Living Arrangements/Services Living arrangements for the past 2 months: Single Family Home Lives with:: Spouse Patient language and need for interpreter reviewed:: Yes Do you feel safe going back to the place where you live?: Yes      Need for Family Participation in Patient Care: Yes (Comment) Care giver support system in place?: Yes (comment)    Criminal Activity/Legal Involvement Pertinent to Current Situation/Hospitalization: No - Comment as needed  Activities of Daily Living      Permission Sought/Granted Permission sought to share information with : Case Manager Permission granted to share information with : Yes, Verbal Permission Granted  Share Information with NAME: Jonathon [name] ([id number])[phone]           Emotional Assessment Appearance:: Appears stated age Attitude/Demeanor/Rapport: Engaged Affect (typically observed): Accepting Orientation: : Oriented to Self, Oriented to Place, Oriented to  Time, Oriented to Situation Alcohol / Substance Use: Not Applicable Psych Involvement: No (comment)  Admission diagnosis:  Gangrene of toe (HCC) [I96] SIRS (systemic inflammatory response syndrome) (HCC) [R65.10] AKI (acute kidney injury) ([place]) [N17.9] Patient Active Problem List   Diagnosis Date Noted  . Acute on chronic kidney failure ([place]) 07/15/2019  . Congestive heart disease ([place]) 07/15/2019  . SIRS (systemic inflammatory response syndrome) ([place]) 07/15/2019  . Atrial fibrillation, chronic ([place]) 07/15/2019  . Neuropathy   . Marijuana abuse   . PAD (peripheral artery disease) ([place]) 06/24/2019  . DVT, lower extremity, distal, acute, right ([place]) 06/09/2019  . Essential hypertension 06/09/2019  . CKD (chronic kidney disease) stage 3, GFR 30-59 ml/min 06/09/2019  . DM (diabetes mellitus), type 2 with renal complications ([place]) 17/40/8144  . Thrombocytopenia ([place]) 06/09/2019  . Elevated hemoglobin ([place]) 06/09/2019  . Hypothyroidism 06/09/2019  . Diabetic neuropathy ([place]) 06/09/2019  .  Gout 06/09/2019  . Hyperlipidemia 06/09/2019  . GAD (generalized anxiety disorder) 06/09/2019  . Cellulitis of right foot 06/08/2019   PCP:  Aurea Graff, PA-C Pharmacy:   [id number] - [place], [place] - [address] AT [place] [address] [place]  [address] Phone: [phone] Fax: [phone]     Social Determinants of Health (SDOH) Interventions    Readmission Risk Interventions Readmission Risk Prevention Plan 07/18/2019  Transportation Screening Complete  PCP or Specialist Appt within 3-5 Days Complete  Palliative Care Screening Not Applicable

## 2019-07-18 NOTE — Anesthesia Preprocedure Evaluation (Addendum)
Anesthesia Evaluation  Patient identified by MRN, date of birth, ID band Patient awake    Reviewed: Allergy & Precautions, NPO status , Patient's Chart, lab work & pertinent test results  Airway Mallampati: III  TM Distance: >3 FB Neck ROM: Full    Dental no notable dental hx.    Pulmonary former smoker,    Pulmonary exam normal breath sounds clear to auscultation       Cardiovascular hypertension, Pt. on medications and Pt. on home beta blockers + Peripheral Vascular Disease, +CHF and + DVT  + dysrhythmias Atrial Fibrillation  Rhythm:Irregular Rate:Normal  ECG: a-fib, rate 103  ECHO: 1. Left ventricular ejection fraction, by estimation, is 35 to 40%. The  left ventricle has moderately decreased function. The left ventricle  demonstrates global hypokinesis. Unable to assess LV diastolic filling due  ot underlying atrial fibrillation.  2. Right ventricular systolic function is normal. The right ventricular  size is normal. There is normal pulmonary artery systolic pressure.  3. Left atrial size was mildly dilated.  4. The mitral valve is normal in structure and function. Trivial mitral  valve regurgitation. No evidence of mitral stenosis.  5. The aortic valve is tricuspid. Aortic valve regurgitation is not  visualized. Mild aortic valve sclerosis is present, with no evidence of  aortic valve stenosis.  6. The inferior vena cava is dilated in size with >50% respiratory  variability, suggesting right atrial pressure of 8 mmHg.    Neuro/Psych PSYCHIATRIC DISORDERS Anxiety negative neurological ROS     GI/Hepatic Neg liver ROS, PUD,   Endo/Other  diabetes, Oral Hypoglycemic AgentsHypothyroidism   Renal/GU Renal disease     Musculoskeletal Gout   Abdominal (+) + obese,   Peds  Hematology HLD  Thrombocytopenia    Anesthesia Other Findings gangrene right great toe  Reproductive/Obstetrics                             Anesthesia Physical Anesthesia Plan  ASA: III  Anesthesia Plan: General   Post-op Pain Management:    Induction: Intravenous  PONV Risk Score and Plan: 2 and Ondansetron, Dexamethasone, Midazolam and Treatment may vary due to age or medical condition  Airway Management Planned: LMA  Additional Equipment:   Intra-op Plan:   Post-operative Plan: Extubation in OR  Informed Consent: I have reviewed the patients History and Physical, chart, labs and discussed the procedure including the risks, benefits and alternatives for the proposed anesthesia with the patient or authorized representative who has indicated his/her understanding and acceptance.     Dental advisory given  Plan Discussed with: CRNA  Anesthesia Plan Comments:        Anesthesia Quick Evaluation

## 2019-07-18 NOTE — Progress Notes (Signed)
[facility]  PROGRESS NOTE  Jonathon [name] KVQ:[id number] DOB: March 25, 1951 DOA: 07/15/2019 PCP: Aurea Graff, PA-C   Brief HPI:   69 year old male with history of hypertension, hyperlipidemia, hypothyroidism, diabetes mellitus type 2, diabetic neuropathy, general anxiety disorder, CKD stage III, gout, atrial fibrillation, PAD/DVT on Xarelto came to the ED for low blood pressure.  Patient went to his cardiologist office and where he was found to have low blood pressure and was tachycardic.  He was sent to ED for further evaluation and management.  EKG showed A. fib with RVR.  Patient has been having generalized fatigue and lethargy for past 2 to 3 weeks. He was admitted from 06/24/2019 to 07/01/2019 for atrial thromboembolic event to right lower extremity.  At that time patient underwent right popliteal, superficial femoral and tibial artery embolectomy by Dr. Oneida Alar.  At that time patient had gangrenous right toe, and plan was to follow-up as outpatient.  In the ED x-ray of the foot showed soft tissue air along the lateral aspect of first digit.  Patient started on IV vancomycin and Rocephin.  Vascular surgery was consulted    Subjective   Patient seen and examined, s/p amputation of first right great toe.  Denies any complaints.   Assessment/Plan:   1. Sepsis-resolved likely from infected right big toe, gangrene.  X-ray of the foot shows soft tissue air along the lateral aspect of first digit. Patient started on vancomycin, ceftriaxone. Vascular surgery has seen the patient.  Patient underwent amputation of right big toe 2. Gangrene right great toe-s/p amputation as above.  Stable.  Vascular surgery following. 3. Acute kidney injury on CKD stage III-patient baseline creatinine is 1.26.  He presented with creatinine of 2.24.  Started on IV normal saline.  Today creatinine has improved to 1.93.  Follow BMP in a.m. 4. Atrial fibrillation-heart rate is controlled, continue Xarelto for  anticoagulation.  Metoprolol was held yesterday for hypotension.  Restarted metoprolol succinate at at lower-dose of 50 mg p.o. daily. 5. History of hypertension-hold Cozaar.  Restarted metoprolol at lower dose as above. 6. Diabetes mellitus type 2-hold oral hypoglycemic agents, continue sliding scale insulin with NovoLog. 7. Diabetic neuropathy-continue Lyrica 8. Hyperlipidemia-continue statin and gemfibrozil. 9. Hypokalemia-potassium is 3.0.  Replace potassium and follow BMP in a.m. 10. Peripheral arterial disease/right lower extremity DVT-s/p right popliteal, superficial femoral and tibial artery embolectomy on 06/27/2019. Continue aspirin, statin, Xarelto. 11. Chronic systolic CHF-recent EF 45 to 50%.  Euvolemic.  Chest x-ray shows no pulmonary edema.   SpO2: 97 % O2 Flow Rate (L/min): 2 L/min   COVID-19 Labs  No results for input(s): DDIMER, FERRITIN, LDH, CRP in the last 72 hours.  Lab Results  Component Value Date   SARSCOV2NAA NEGATIVE 07/15/2019   [place] NEGATIVE 06/24/2019   [place] NEGATIVE 06/08/2019     CBG: Recent Labs  Lab 07/17/19 2054 07/18/19 0748 07/18/19 0901 07/18/19 1029 07/18/19 1227  GLUCAP 117* 95 86 91 86    CBC: Recent Labs  Lab 07/15/19 1202 07/16/19 0345 07/17/19 0329 07/18/19 0604  WBC 5.2 5.7 6.5 4.9  NEUTROABS 4.0  --   --   --   HGB 15.3 15.2 13.8 13.8  HCT 47.5 47.0 41.5 40.7  MCV 98.3 98.1 93.5 93.3  PLT 147* 134* PLATELET CLUMPS NOTED ON SMEAR, UNABLE TO ESTIMATE 115*    Basic Metabolic Panel: Recent Labs  Lab 07/15/19 1202 07/15/19 1607 07/16/19 0345 07/17/19 0329 07/17/19 1951 07/18/19 0604  NA 137  --  138 137  --  139  K 5.0  --  3.7 3.3*  --  3.0*  CL 103  --  103 104  --  105  CO2 22  --  21* 20*  --  22  GLUCOSE 119*  --  89 85  --  91  BUN 24*  --  23 16  --  12  CREATININE 2.24*  --  1.93* 1.51*  --  1.22  CALCIUM 9.1  --  8.8* 8.3*  --  7.9*  MG  --  1.1*  --   --  1.8  --   PHOS  --  3.0  --   --    --   --      Liver Function Tests: Recent Labs  Lab 07/15/19 1202  AST 54*  ALT 34  ALKPHOS 61  BILITOT 1.0  PROT 6.5  ALBUMIN 2.9*     DVT prophylaxis: Xarelto  Code Status: Full code  Family Communication: No family at bedside  Disposition Plan: Patient admitted with sepsis due to infected gangrenous right big toe.  Barrier to discharge-patient on IV antibiotics, vascular surgery following.  Might need amputation of right big toe.     Scheduled medications:  . ALPRAZolam  0.25 mg Oral QHS  . aspirin EC  81 mg Oral Daily  . atorvastatin  40 mg Oral QHS  . gemfibrozil  600 mg Oral BID AC  . insulin aspart  0-15 Units Subcutaneous TID WC  . insulin aspart  0-5 Units Subcutaneous QHS  . levothyroxine  112 mcg Oral QHS  . metoprolol succinate  50 mg Oral Daily  . oxybutynin  10 mg Oral QHS  . pantoprazole  40 mg Oral BID  . PARoxetine  40 mg Oral QHS  . pregabalin  150 mg Oral QHS  . [START ON 07/19/2019] rivaroxaban  20 mg Oral Q supper  . temazepam  15 mg Oral QHS    Consultants:  Vascular surgery   Antibiotics:   Anti-infectives (From admission, onward)   Start     Dose/Rate Route Frequency Ordered Stop   07/18/19 1600  vancomycin (VANCOREADY) IVPB 1500 mg/300 mL     1,500 mg 150 mL/hr over 120 Minutes Intravenous Every 24 hours 07/18/19 1515     07/17/19 1330  vancomycin (VANCOREADY) IVPB 1750 mg/350 mL  Status:  Discontinued     1,750 mg 175 mL/hr over 120 Minutes Intravenous Every 48 hours 07/15/19 1426 07/16/19 1030   07/16/19 1400  vancomycin (VANCOCIN) IVPB 1000 mg/200 mL premix  Status:  Discontinued     1,000 mg 200 mL/hr over 60 Minutes Intravenous Every 24 hours 07/16/19 1030 07/18/19 1515   07/16/19 1200  cefTRIAXone (ROCEPHIN) 1 g in sodium chloride 0.9 % 100 mL IVPB     1 g 200 mL/hr over 30 Minutes Intravenous Every 24 hours 07/15/19 1851     07/15/19 1230  vancomycin (VANCOREADY) IVPB 1750 mg/350 mL     1,750 mg 175 mL/hr over 120  Minutes Intravenous  Once 07/15/19 1219 07/15/19 1531   07/15/19 1215  vancomycin (VANCOCIN) IVPB 1000 mg/200 mL premix  Status:  Discontinued     1,000 mg 200 mL/hr over 60 Minutes Intravenous  Once 07/15/19 1203 07/15/19 1219   07/15/19 1215  cefTRIAXone (ROCEPHIN) 2 g in sodium chloride 0.9 % 100 mL IVPB     2 g 200 mL/hr over 30 Minutes Intravenous  Once 07/15/19 1203 07/15/19 1301       Objective  Vitals:   07/18/19 1058 07/18/19 1059 07/18/19 1100 07/18/19 1115  BP:  114/83  119/74  Pulse: (!) 104 (!) 105 (!) 101   Resp: 15 16 13    Temp:   98.4 F (36.9 C)   TempSrc:      SpO2: 96% 96% 97%   Weight:      Height:        Intake/Output Summary (Last 24 hours) at 07/18/2019 1519 Last data filed at 07/18/2019 1100 Gross per 24 hour  Intake 2562.98 ml  Output 1275 ml  Net 1287.98 ml    03/20 1901 - 03/22 0700 In: 5284.4 [P.O.:360; I.V.:4355.9] Out: 2050 [Urine:2050]  Filed Weights   07/15/19 1616 07/16/19 0650 07/18/19 0731  Weight: 104.1 kg 103.3 kg 102.6 kg    Physical Examination:   General-appears in no acute distress Heart-S1-S2, regular, no murmur auscultated Lungs-clear to auscultation bilaterally, no wheezing or crackles auscultated Abdomen-soft, nontender, no organomegaly Extremities-s/p right great toe amputation.  Dressing in place. Neuro-alert, oriented x3, no focal deficit noted    Data Reviewed:   Recent Results (from the past 240 hour(s))  Blood Culture (routine x 2)     Status: None (Preliminary result)   Collection Time: 07/15/19 12:02 PM   Specimen: BLOOD LEFT FOREARM  Result Value Ref Range Status   Specimen Description BLOOD LEFT FOREARM  Final   Special Requests   Final    BOTTLES DRAWN AEROBIC AND ANAEROBIC Blood Culture results may not be optimal due to an inadequate volume of blood received in culture bottles   Culture   Final    NO GROWTH 3 DAYS Performed at [facility], [place] [address] [place], [place] [address]     Report Status PENDING  Incomplete  Blood Culture (routine x 2)     Status: None (Preliminary result)   Collection Time: 07/15/19 12:28 PM   Specimen: BLOOD RIGHT HAND  Result Value Ref Range Status   Specimen Description BLOOD RIGHT HAND  Final   Special Requests   Final    BOTTLES DRAWN AEROBIC AND ANAEROBIC Blood Culture results may not be optimal due to an inadequate volume of blood received in culture bottles   Culture   Final    NO GROWTH 3 DAYS Performed at [facility], [place] [address], [place], [place] [address]    Report Status PENDING  Incomplete  SARS CORONAVIRUS 2 (TAT 6-24 HRS) Nasopharyngeal Nasopharyngeal Swab     Status: None   Collection Time: 07/15/19  2:33 PM   Specimen: Nasopharyngeal Swab  Result Value Ref Range Status   SARS Coronavirus 2 NEGATIVE NEGATIVE Final    Comment: (NOTE) SARS-CoV-2 target nucleic acids are NOT DETECTED. The SARS-CoV-2 RNA is generally detectable in upper and lower respiratory specimens during the acute phase of infection. Negative results do not preclude SARS-CoV-2 infection, do not rule out co-infections with other pathogens, and should not be used as the sole basis for treatment or other patient management decisions. Negative results must be combined with clinical observations, patient history, and epidemiological information. The expected result is Negative. Fact Sheet for Patients: SugarRoll.be Fact Sheet for Healthcare Providers: https://www.woods-mathews.com/ This test is not yet approved or cleared by the [place] FDA and  has been authorized for detection and/or diagnosis of SARS-CoV-2 by FDA under an Emergency Use Authorization (EUA). This EUA will remain  in effect (meaning this test can be used) for the duration of the COVID-19 declaration under Section 56 4(b)(1)  of the Act, 21 U.S.C. section 360bbb-3(b)(1), unless the authorization is terminated or revoked  sooner. Performed at [facility], [place] [address], [place], [place] [address]       Admission status: The appropriate admission status for this patient is INPATIENT. Inpatient status is judged to be reasonable and necessary in order to provide the required intensity of service to ensure the patient's safety. The patient's presenting symptoms, physical exam findings, and initial radiographic and laboratory data in the context of their chronic comorbidities is felt to place them at high risk for further clinical deterioration. Furthermore, it is not anticipated that the patient will be medically stable for discharge from the hospital within 2 midnights of admission. The following factors support the admission status of inpatient.    The patient's presenting symptoms include hypotension The worrisome physical exam findings include hypertension. The initial radiographic and laboratory data are worrisome because of gangrene of the right  big toe The chronic co-morbidities include     * I certify that at the point of admission it is my clinical judgment that the patient will require inpatient hospital care spanning beyond 2 midnights from the point of admission due to high intensity of service, high risk for further deterioration and high frequency of surveillance required.Oswald Hillock   [facility] If 7PM-7AM, please contact night-coverage at [email or web address], Office  [phone]   07/18/2019, 3:19 PM  LOS: 3 days

## 2019-07-18 NOTE — Progress Notes (Signed)
Orthopedic Tech Progress Note Patient Details:  Jonathon [name] 05-13-1950 [id number]  Ortho Devices Type of Ortho Device: Darco shoe Ortho Device/Splint Location: RLE Ortho Device/Splint Interventions: Ordered, Application   Post Interventions Patient Tolerated: Well Instructions Provided: Care of device, Adjustment of device   [name] 07/18/2019, 11:42 AM

## 2019-07-18 NOTE — Anesthesia Procedure Notes (Signed)
Procedure Name: LMA Insertion Date/Time: 07/18/2019 9:42 AM Performed by: Colin Benton, CRNA Pre-anesthesia Checklist: Patient identified, Emergency Drugs available, Suction available and Patient being monitored Patient Re-evaluated:Patient Re-evaluated prior to induction Oxygen Delivery Method: Circle system utilized Preoxygenation: Pre-oxygenation with 100% oxygen Induction Type: IV induction Ventilation: Mask ventilation without difficulty LMA: LMA inserted LMA Size: 5.0 Number of attempts: 1 Placement Confirmation: positive ETCO2 and breath sounds checked- equal and bilateral Tube secured with: Tape Dental Injury: Teeth and Oropharynx as per pre-operative assessment

## 2019-07-18 NOTE — Anesthesia Postprocedure Evaluation (Signed)
Anesthesia Post Note  Patient: Jonathon [name]  Procedure(s) Performed: AMPUTATION RIGHT GREAT TOE (Right Toe)     Patient location during evaluation: PACU Anesthesia Type: General Level of consciousness: awake and alert Pain management: pain level controlled Vital Signs Assessment: post-procedure vital signs reviewed and stable Respiratory status: spontaneous breathing, nonlabored ventilation, respiratory function stable and patient connected to nasal cannula oxygen Cardiovascular status: blood pressure returned to baseline and stable Postop Assessment: no apparent nausea or vomiting Anesthetic complications: no    Last Vitals:  Vitals:   07/18/19 1100 07/18/19 1115  BP:  119/74  Pulse: (!) 101   Resp: 13   Temp: 36.9 C   SpO2: 97%     Last Pain:  Vitals:   07/18/19 1100  TempSrc:   PainSc: 0-No pain                 Leshonda Galambos P Payten Hobin

## 2019-07-19 DIAGNOSIS — I739 Peripheral vascular disease, unspecified: Secondary | ICD-10-CM

## 2019-07-19 LAB — BASIC METABOLIC PANEL
Anion gap: 9 (ref 5–15)
BUN: 16 mg/dL (ref 8–23)
CO2: 21 mmol/L — ABNORMAL LOW (ref 22–32)
Calcium: 7.6 mg/dL — ABNORMAL LOW (ref 8.9–10.3)
Chloride: 107 mmol/L (ref 98–111)
Creatinine, Ser: 1.29 mg/dL — ABNORMAL HIGH (ref 0.61–1.24)
GFR calc Af Amer: >60 mL/min (ref >60)
GFR calc non Af Amer: 56 mL/min — ABNORMAL LOW (ref >60)
Glucose, Bld: 125 mg/dL — ABNORMAL HIGH (ref 70–99)
Potassium: 4.2 mmol/L (ref 3.5–5.1)
Sodium: 137 mmol/L (ref 135–145)

## 2019-07-19 LAB — GLUCOSE, CAPILLARY
Glucose-Capillary: 107 mg/dL — ABNORMAL HIGH (ref 70–99)
Glucose-Capillary: 148 mg/dL — ABNORMAL HIGH (ref 70–99)

## 2019-07-19 MED ORDER — CEPHALEXIN 500 MG PO CAPS: 500.0000 mg | ORAL_CAPSULE | Freq: Two times a day (BID) | ORAL | 0 refills | Status: AC

## 2019-07-19 MED ORDER — FENOFIBRATE 54 MG PO TABS: 54.0000 mg | ORAL_TABLET | Freq: Every day | ORAL | 2 refills | Status: DC

## 2019-07-19 NOTE — Progress Notes (Addendum)
[facility]  Subjective  - Comfortable and not much pain.   Objective 119/72 62 (!) 97.5 F (36.4 C) (Oral) 16 96%  Intake/Output Summary (Last 24 hours) at 07/19/2019 0748 Last data filed at 07/19/2019 0542 Gross per 24 hour  Intake 3135.46 ml  Output 825 ml  Net 2310.46 ml    Right foot dressing clean and dry Lungs non labored breathing  Assessment/Planning: POD # 1 right GT  Plan to change dressing tomorrow I have ordered a black darco shoe for heel weight bearing   Roxy Horseman 07/19/2019 7:48 AM --  Pain controlled Ok for d/c from my standpoint We will arrange for follow up in t 3-4 weeks  [name], MD [facility]: [phone]   Laboratory Lab Results: Recent Labs    07/17/19 0329 07/18/19 0604  WBC 6.5 4.9  HGB 13.8 13.8  HCT 41.5 40.7  PLT PLATELET CLUMPS NOTED ON SMEAR, UNABLE TO ESTIMATE 115*   BMET Recent Labs    07/18/19 0604 07/19/19 0324  NA 139 137  K 3.0* 4.2  CL 105 107  CO2 22 21*  GLUCOSE 91 125*  BUN 12 16  CREATININE 1.22 1.29*  CALCIUM 7.9* 7.6*    COAG Lab Results  Component Value Date   INR 1.9 (H) 07/18/2019   No results found for: PTT

## 2019-07-19 NOTE — Evaluation (Signed)
Physical Therapy Evaluation Patient Details Name: Jonathon [name] MRN: [id number] DOB: 1950-07-29 Today's Date: 07/19/2019   History of Present Illness  69 yo admitted with low BP and tachycardia with AFib and Rt great toe gangrene s/p amputation. PMHx: HTN, HLD, neuropathy, DM, hypothyroidism, anxiety, CKD, gout, AFib  Clinical Impression  Pt very pleasant, joking throughout session. Pt required min assist to don darco shoe with education for benefit of shoe on LLE to assist with height difference. Pt and wife educated for use of RW and darco shoe with all standing activity. Pt with decreased stride, safety with stairs and function who will benefit from acute therapy to maximize independence.     Follow Up Recommendations No PT follow up    Equipment Recommendations  Rolling walker with 5" wheels    Recommendations for Other Services       Precautions / Restrictions Precautions Required Braces or Orthoses: Other Brace Other Brace: darco rt foot Restrictions Weight Bearing Restrictions: Yes RLE Weight Bearing: Weight bearing as tolerated Other Position/Activity Restrictions: on heel      Mobility  Bed Mobility Overal bed mobility: Needs Assistance Bed Mobility: Supine to Sit     Supine to sit: Supervision;HOB elevated     General bed mobility comments: use of rail, cues for sequence and increased time  Transfers Overall transfer level: Needs assistance   Transfers: Sit to/from Stand Sit to Stand: Min guard         General transfer comment: cues for hand placement and safety  Ambulation/Gait Ambulation/Gait assistance: Min guard Gait Distance (Feet): 300 Feet Assistive device: Rolling walker (2 wheeled) Gait Pattern/deviations: Step-through pattern;Decreased stride length   Gait velocity interpretation: >2.62 ft/sec, indicative of community ambulatory General Gait Details: cues for proximity to RW, sequence and weight maintained on right  heel  Stairs Stairs: Yes Stairs assistance: Min guard Stair Management: Step to pattern;Forwards;One rail Left Number of Stairs: 2 General stair comments: pt able to ascend stairs with cues for sequence, use of rail and HHA for increased stability  Wheelchair Mobility    Modified Rankin (Stroke Patients Only)       Balance Overall balance assessment: Mild deficits observed, not formally tested                                           Pertinent Vitals/Pain Pain Assessment: No/denies pain    Home Living Family/patient expects to be discharged to:: Private residence Living Arrangements: Spouse/significant other Available Help at Discharge: Family;Available 24 hours/day Type of Home: House Home Access: Stairs to enter   [employer] of Steps: 2 Home Layout: One level Home Equipment: Cane - single point      Prior Function Level of Independence: Independent               Hand Dominance        Extremity/Trunk Assessment   Upper Extremity Assessment Upper Extremity Assessment: Overall WFL for tasks assessed    Lower Extremity Assessment Lower Extremity Assessment: Overall WFL for tasks assessed    Cervical / Trunk Assessment Cervical / Trunk Assessment: Normal  Communication   Communication: No difficulties  Cognition Arousal/Alertness: Awake/alert Behavior During Therapy: WFL for tasks assessed/performed Overall Cognitive Status: Within Functional Limits for tasks assessed  General Comments      Exercises     Assessment/Plan    PT Assessment Patient needs continued PT services  PT Problem List Decreased activity tolerance;Decreased balance;Decreased knowledge of use of DME;Decreased skin integrity;Decreased mobility       PT Treatment Interventions Gait training;Stair training;Functional mobility training;Therapeutic activities;Patient/family education;DME  instruction;Therapeutic exercise    PT Goals (Current goals can be found in the Care Plan section)  Acute Rehab PT Goals Patient Stated Goal: return home to cook PT Goal Formulation: With patient/family Time For Goal Achievement: 07/26/19 Potential to Achieve Goals: Good    Frequency Min 3X/week   Barriers to discharge        Co-evaluation               AM-PAC PT "6 Clicks" Mobility  Outcome Measure Help needed turning from your back to your side while in a flat bed without using bedrails?: A Little Help needed moving from lying on your back to sitting on the side of a flat bed without using bedrails?: A Little Help needed moving to and from a bed to a chair (including a wheelchair)?: A Little Help needed standing up from a chair using your arms (e.g., wheelchair or bedside chair)?: A Little Help needed to walk in hospital room?: A Little Help needed climbing 3-5 steps with a railing? : A Little 6 Click Score: 18    End of Session Equipment Utilized During Treatment: Gait belt Activity Tolerance: Patient tolerated treatment well Patient left: in chair;with call bell/phone within reach;with family/visitor present Nurse Communication: Mobility status;Weight bearing status PT Visit Diagnosis: Other abnormalities of gait and mobility (R26.89);Difficulty in walking, not elsewhere classified (R26.2)    Time: 5631-4970 PT Time Calculation (min) (ACUTE ONLY): 25 min   Charges:   PT Evaluation $PT Eval Moderate Complexity: 1 Mod PT Treatments $Gait Training: 8-22 mins        Valente Fosberg P, PT Acute Rehabilitation Services Pager: [phone] Office: [phone]   Sandy Salaam Anyela Napierkowski 07/19/2019, 12:28 PM

## 2019-07-19 NOTE — Discharge Summary (Addendum)
Physician Discharge Summary  Jonathon [name] XBL:[id number] DOB: 11-Aug-1950 DOA: 07/15/2019  PCP: Aurea Graff, PA-C  Admit date: 07/15/2019 Discharge date: 07/19/2019  Time spent: 50* minutes  Recommendations for Outpatient Follow-up:  1. Follow-up PCP in 1 week 2. Check UA in 1 week, if still shows RBCs, consider urology evaluation as outpatient. 3. Stop taking oral potassium.  Check BMP in 1 week as outpatient. 4. Follow-up vascular surgery in 3 to 4 weeks.  Discharge Diagnoses:  Principal Problem:   SIRS (systemic inflammatory response syndrome) (HCC) Active Problems:   DVT, lower extremity, distal, acute, right (HCC)   Essential hypertension   DM (diabetes mellitus), type 2 with renal complications (HCC)   Thrombocytopenia (HCC)   Hypothyroidism   Gout   Hyperlipidemia   GAD (generalized anxiety disorder)   PAD (peripheral artery disease) (HCC)   Acute on chronic kidney failure (HCC)   Congestive heart disease (HCC)   Atrial fibrillation, chronic ([place])   Neuropathy   Marijuana abuse   Discharge Condition: Stable  Diet recommendation: Heart healthy diet  Filed Weights   07/16/19 0650 07/18/19 0731 07/19/19 0556  Weight: 103.3 kg 102.6 kg 104.8 kg    History of present illness:  69 year old male with history of hypertension, hyperlipidemia, hypothyroidism, diabetes mellitus type 2, diabetic neuropathy, general anxiety disorder, CKD stage III, gout, atrial fibrillation, PAD/DVT on Xarelto came to the ED for low blood pressure.  Patient went to his cardiologist office and where he was found to have low blood pressure and was tachycardic.  He was sent to ED for further evaluation and management.  EKG showed A. fib with RVR.  Patient has been having generalized fatigue and lethargy for past 2 to 3 weeks. He was admitted from 06/24/2019 to 07/01/2019 for atrial thromboembolic event to right lower extremity.  At that time patient underwent right popliteal, superficial  femoral and tibial artery embolectomy by Dr. Oneida Alar.  At that time patient had gangrenous right toe, and plan was to follow-up as outpatient.  In the ED x-ray of the foot showed soft tissue air along the lateral aspect of first digit.  Patient started on IV vancomycin and Rocephin.  Vascular surgery was consulted  Hospital Course:  1. Sepsis-resolved likely from infected right big toe, gangrene.  X-ray of the foot shows soft tissue air along the lateral aspect of first digit. Patient started on vancomycin, ceftriaxone. Vascular surgery has seen the patient.  Patient underwent amputation of right big toe.  2. Hematuria-patient had a normal UA with 23-50 RBCs per high-power field, greater than 50 WBCs.  Denies dysuria.  He has been on ceftriaxone for 4 days.  We will discharge him on Keflex for 3 more days.  I have called and discussed with urology, Dr. Louis Meckel will make sure that patient has an outpatient appointment.  Also check UA in 1 week to check for hematuria.  3. Gangrene right great toe-s/p amputation as above.  Stable.  Vascular surgery has cleared the patient for discharge.  4. Acute kidney injury on CKD stage III-patient baseline creatinine is 1.26.  He presented with creatinine of 2.24.  Started on IV normal saline.  Today creatinine has improved to 1.29.  5. Atrial fibrillation-heart rate is controlled, continue Xarelto for anticoagulation.  Metoprolol was held yesterday for hypotension.  Restarted metoprolol succinate   6. History of hypertension-continue Cozaar, metoprolol.  7. Diabetes mellitus type 2-continue home medications.  8. Hyperlipidemia-continue statin fenofibrate.  Gemfibrozil was stopped in the hospital.  9. Hypokalemia-potassium is replete at 4.2.  Patient takes 60 mg of KCl at home.  He is not on diuretics.  Will hold potassium supplementation at this time  10. Peripheral arterial disease/right lower extremity DVT-s/p right popliteal, superficial femoral  and tibial artery embolectomy on 06/27/2019. Continue aspirin, statin, Xarelto.  10. Chronic systolic CHF-recent EF 45 to 50%.  Euvolemic.  Chest x-ray shows no pulmonary edema.   Procedures:  S/p amputation of right big toe  Consultations:  Vascular surgery  Discharge Exam: Vitals:   07/19/19 1221 07/19/19 1226  BP:  119/65  Pulse: 100 73  Resp:    Temp:  (!) 97.5 F (36.4 C)  SpO2: 98% 99%    General: Appears in no acute distress Cardiovascular: S1-S2, regular Respiratory: clear bilaterally  Discharge Instructions   Discharge Instructions    Diet - low sodium heart healthy   Complete by: As directed    Discharge instructions   Complete by: As directed    Follow-up PCP in 1 week.  Check urinalysis.  If  You continue to have blood in urine, hematuria.  You  will need urology evaluation as outpatient.   Increase activity slowly   Complete by: As directed      Allergies as of 07/19/2019      Reactions   Morphine And Related Itching      Medication List    STOP taking these medications   gemfibrozil 600 MG tablet Commonly known as: LOPID   ibuprofen 200 MG tablet Commonly known as: ADVIL   potassium chloride SA 20 MEQ tablet Commonly known as: KLOR-CON     TAKE these medications   ALPRAZolam 0.25 MG tablet Commonly known as: XANAX Take 0.25 mg by mouth at bedtime.   aspirin 81 MG EC tablet Take 1 tablet (81 mg total) by mouth daily.   atorvastatin 40 MG tablet Commonly known as: LIPITOR Take 40 mg by mouth at bedtime.   cephALEXin 500 MG capsule Commonly known as: KEFLEX Take 1 capsule (500 mg total) by mouth 2 (two) times daily for 3 days.   colchicine 0.6 MG tablet Take 0.6 mg by mouth at bedtime.   doxazosin 4 MG tablet Commonly known as: CARDURA Take 4 mg by mouth at bedtime.   febuxostat 40 MG tablet Commonly known as: ULORIC Take 40 mg by mouth at bedtime.   fenofibrate 54 MG tablet Take 1 tablet (54 mg total) by mouth  daily. Start taking on: July 20, 2019   Jardiance 10 MG Tabs tablet Generic drug: empagliflozin Take 10 mg by mouth at bedtime.   levothyroxine 112 MCG tablet Commonly known as: SYNTHROID Take 112 mcg by mouth at bedtime.   losartan 25 MG tablet Commonly known as: COZAAR TAKE 1/2 TABLET(12.5 MG) BY MOUTH DAILY What changed: See the new instructions.   Lyrica 75 MG capsule Generic drug: pregabalin Take 150 mg by mouth at bedtime.   metFORMIN 500 MG 24 hr tablet Commonly known as: GLUCOPHAGE-XR Take 1,000 mg by mouth at bedtime.   metoprolol succinate 100 MG 24 hr tablet Commonly known as: TOPROL-XL TAKE 1 TABLET(100 MG) BY MOUTH DAILY WITH OR IMMEDIATELY FOLLOWING A MEAL What changed: See the new instructions.   oxybutynin 10 MG 24 hr tablet Commonly known as: DITROPAN-XL Take 10 mg by mouth at bedtime.   oxyCODONE 5 MG immediate release tablet Commonly known as: Oxy IR/ROXICODONE Take 1 tablet (5 mg total) by mouth every 6 (six) hours as needed for moderate pain.  pantoprazole 40 MG tablet Commonly known as: PROTONIX Take 40 mg by mouth 2 (two) times daily.   PARoxetine 40 MG tablet Commonly known as: PAXIL Take 40 mg by mouth at bedtime.   rivaroxaban 20 MG Tabs tablet Commonly known as: XARELTO Take 1 tablet (20 mg total) by mouth daily with supper.   temazepam 15 MG capsule Commonly known as: RESTORIL Take 15 mg by mouth at bedtime.   testosterone cypionate 100 MG/ML injection Commonly known as: DEPOTESTOTERONE CYPIONATE Inject 200 mg into the muscle every 14 (fourteen) days.            Durable Medical Equipment  (From admission, onward)         Start     Ordered   07/19/19 1415  For home use only DME Walker rolling  Once    Question Answer Comment  Walker: With 5 Inch Wheels   Patient needs a walker to treat with the following condition PAD (peripheral artery disease) ([place])      07/19/19 1415         Allergies  Allergen Reactions   . Morphine And Related Itching   Follow-up Information    Aurea Graff, PA-C Follow up in 1 week(s).   Specialty: Physician Assistant Why: Follow-up PCP in 1 week.  Check urinalysis.  If  You continue to have blood in urine, hematuria.  You  will need urology evaluation as outpatient. Contact information: [address] [place] [address] [place], Cassie Freer, MD .   Specialties: Internal Medicine, Cardiology, Radiology Contact information: [place] [address] [phone]            The results of significant diagnostics from this hospitalization (including imaging, microbiology, ancillary and laboratory) are listed below for reference.    Significant Diagnostic Studies: PERIPHERAL VASCULAR CATHETERIZATION  Result Date: 06/27/2019 Procedure: Aortogram with bilateral lower extremity runoff Preoperative diagnosis: Ischemia right foot Postoperative diagnosis: Same Anesthesia: Local Operative findings: Occlusion right superficial femoral popliteal posterior tibial peroneal arteries with what appears to be embolic material one-vessel AT runoff Operative details: After team informed consent, patient taken the [place] lab.  The patient placed in supine position angio table.  Both groins were prepped and draped usual sterile fashion.  Local anesthesia was infiltrated of the left common femoral artery.  Ultrasound was used to identify left common femoral artery and femoral bifurcation.  An introducer needle was used to cannulate the left common femoral artery and 035 Bentson wire advanced up the abdominal aorta under fluoroscopic guidance.  A 5 French sheath placed over the guidewire in the left common femoral artery.  This was thoroughly flushed with heparinized saline.  5 French pigtail catheter was advanced over the guidewire and abdominal aortogram was obtained AP projection.  Left and right renal arteries are patent.  Infrarenal abdominal aorta  is patent.  Left and right common external and internal iliac arteries are all widely patent. Next pigtail catheter was pulled down just above the aortic bifurcation pelvic angiogram was obtained to confirm the above findings. At this point the pigtail catheter was removed over guidewire and exchanged for a 5 [place] crossover catheter.  This was used to selectively catheterize the right common iliac and then the guidewire advanced down into the distal right external iliac artery.  Crossover catheter was swapped out for a 5 [place] straight catheter.  Right lower extremity arteriogram was then obtained.  The right  common femoral profunda femoris are all patent.  The right superficial femoral artery occludes about 5 cm after its origin with a meniscus suggestive of embolic material.  The entire SFA is occluded.  There is a small island of contrast opacification in the popliteal artery.  The remainder of the popliteal artery is occluded and is very irregular in its surface in the below-knee segment.  The contrast does partially opacified the peroneal and posterior tibial arteries but they appear to have embolic material within them as well.  The anterior tibial artery is patent throughout its course. At this point 5 French straight catheter was removed and left lower extremity arteriogram was obtained through the sheath.  The left common femoral profundofemoral and superficial femoral arteries are all patent.  Left popliteal artery is patent.  Left anterior tibial artery is the dominant runoff vessel to the left foot the peroneal and posterior tibial arteries are patent but are smaller in caliber.  At this point the procedure was concluded.  The patient tolerated procedure well and there were complications.  Patient was taken to the operating room for right leg embolectomy today. [name], MD [facility] Office: [phone]  DG Chest Port 1 View  Result Date:  07/15/2019 CLINICAL DATA:  History of open wound. EXAM: PORTABLE CHEST 1 VIEW COMPARISON:  Chest x-ray 08/05/2017. FINDINGS: Mediastinum hilar structures normal. Lungs are clear. No pleural effusion or pneumothorax. Heart size stable. No acute bony abnormality identified. IMPRESSION: 1.  Cardiomegaly.  No pulmonary venous congestion. 2.  No acute pulmonary disease. Electronically Signed   By: Marcello Moores  Register   On: 07/15/2019 12:32   DG Toe Great Right  Result Date: 07/15/2019 CLINICAL DATA:  Open wound EXAM: RIGHT FIRST TOE: 3 V COMPARISON:  Right foot June 08, 2019 FINDINGS: Frontal, oblique, and lateral views were obtained. Soft tissue air is noted along the lateral aspect of the first digit. There is no erosive change or bony destruction appreciable. No fracture or dislocation. There is slight narrowing of the first MTP joint. Other joint spaces appear normal. IMPRESSION: Soft tissue air along the lateral aspect of the first digit. Mild osteoarthritic change first MTP joint. No bony destruction. No fracture or dislocation. Electronically Signed   By: Lowella Grip III M.D.   On: 07/15/2019 12:34   VAS [place] ABI WITH/WO TBI  Result Date: 06/28/2019 LOWER EXTREMITY DOPPLER STUDY Indications: Post op right leg embolectomy 06-27-19 with drain.  Limitations: Today's exam was limited due to A. Fib. Comparison Study: No prior exam. Performing Technologist: Baldwin Crown RDMS  Examination Guidelines: A complete evaluation includes at minimum, Doppler waveform signals and systolic blood pressure reading at the level of bilateral brachial, anterior tibial, and posterior tibial arteries, when vessel segments are accessible. Bilateral testing is considered an integral part of a complete examination. Photoelectric Plethysmograph (PPG) waveforms and toe systolic pressure readings are included as required and additional duplex testing as needed. Limited examinations for reoccurring indications may be performed as  noted.  ABI Findings: +---------+------------------+-----+---------+---------+ Right    Rt Pressure (mmHg)IndexWaveform Comment   +---------+------------------+-----+---------+---------+ Brachial 111                    triphasic          +---------+------------------+-----+---------+---------+ PTA      134               1.21 triphasic          +---------+------------------+-----+---------+---------+ DP  144               1.30 triphasic          +---------+------------------+-----+---------+---------+ Great Toe                       Absent   black toe +---------+------------------+-----+---------+---------+ +---------+------------------+-----+---------+-------+ Left     Lt Pressure (mmHg)IndexWaveform Comment +---------+------------------+-----+---------+-------+ Brachial 111                    triphasic        +---------+------------------+-----+---------+-------+ PTA      143               1.29 triphasic        +---------+------------------+-----+---------+-------+ DP       140               1.26 triphasic        +---------+------------------+-----+---------+-------+ Theodoro Parma               1.02 Normal           +---------+------------------+-----+---------+-------+ +-------+-----------+-----------+------------+------------+ ABI/TBIToday's ABIToday's TBIPrevious ABIPrevious TBI +-------+-----------+-----------+------------+------------+ Right  1.3                                            +-------+-----------+-----------+------------+------------+ Left   1.29       1.02                                +-------+-----------+-----------+------------+------------+ Patient is currently in Atrial Fib.  Summary: Right: Resting right ankle-brachial index is within normal range. No evidence of significant right lower extremity arterial disease. Absent waveform in right great toe. Left: Resting left ankle-brachial index is within normal  range. No evidence of significant left lower extremity arterial disease. The left toe-brachial index is normal.  *See table(s) above for measurements and observations.  Electronically signed by Deitra Mayo MD on 06/28/2019 at 4:44:56 PM.   Final    VAS [place] ABI WITH/WO TBI  Result Date: 06/23/2019 LOWER EXTREMITY DOPPLER STUDY Indications: Gangrene and absent pedal pulses High Risk Factors: Hypertension, Diabetes. Other Factors: CKD 3.  Performing Technologist: Ronal Fear RVS, RCS  Examination Guidelines: A complete evaluation includes at minimum, Doppler waveform signals and systolic blood pressure reading at the level of bilateral brachial, anterior tibial, and posterior tibial arteries, when vessel segments are accessible. Bilateral testing is considered an integral part of a complete examination. Photoelectric Plethysmograph (PPG) waveforms and toe systolic pressure readings are included as required and additional duplex testing as needed. Limited examinations for reoccurring indications may be performed as noted.  ABI Findings: +--------+------------------+-----+-------------------+--------+ Right   Rt Pressure (mmHg)IndexWaveform           Comment  +--------+------------------+-----+-------------------+--------+ [id number]                                                +--------+------------------+-----+-------------------+--------+ PTA     30                0.21 dampened monophasic         +--------+------------------+-----+-------------------+--------+ DP      63  0.43 dampened monophasic         +--------+------------------+-----+-------------------+--------+ +---------+------------------+-----+---------+-------+ Left     Lt Pressure (mmHg)IndexWaveform Comment +---------+------------------+-----+---------+-------+ Brachial 142                                     +---------+------------------+-----+---------+-------+ PTA      187                1.29 triphasic        +---------+------------------+-----+---------+-------+ DP       187               1.29 triphasic        +---------+------------------+-----+---------+-------+ Great Toe121               0.83                  +---------+------------------+-----+---------+-------+ +-------+-----------+-----------------+------------+------------+ ABI/TBIToday's ABIToday's TBI      Previous ABIPrevious TBI +-------+-----------+-----------------+------------+------------+ Right  0.43       low amplitude PPG                         +-------+-----------+-----------------+------------+------------+ Left   1.29       0.83                                      +-------+-----------+-----------------+------------+------------+  Summary: Right: Resting right ankle-brachial index indicates severe right lower extremity arterial disease. Left: Resting left ankle-brachial index is within normal range. No evidence of significant left lower extremity arterial disease. The left toe-brachial index is normal.  *See table(s) above for measurements and observations.  Electronically signed by [name] MD on 06/23/2019 at 3:07:47 PM.    Final    ECHOCARDIOGRAM COMPLETE  Result Date: 06/25/2019    ECHOCARDIOGRAM REPORT   Patient Name:   KRAYTON WORTLEY Date of Exam: 06/25/2019 Medical Rec #:  [id number]         Height:       72.0 in Accession #:    [id number]        Weight:       241.0 lb Date of Birth:  20-Feb-1951        BSA:          2.306 m Patient Age:    69 years          BP:           115/83 mmHg Patient Gender: M                 HR:           109 bpm. Exam Location:  Inpatient Procedure: 2D Echo Indications:    ischemia of lower extremity  History:        Patient has no prior history of Echocardiogram examinations.                 Chronic kidney disease, Arrythmias:Atrial Fibrillation; Risk                 Factors:Diabetes and Dyslipidemia.  Sonographer:    Johny Chess Referring  Phys: [id number] [place]  1. Left ventricular ejection fraction, by estimation, is 35 to 40%. The left ventricle has moderately decreased function. The left ventricle demonstrates global hypokinesis. Unable to assess LV diastolic filling due ot underlying atrial  fibrillation.  2. Right ventricular systolic function is normal. The right ventricular size is normal. There is normal pulmonary artery systolic pressure.  3. Left atrial size was mildly dilated.  4. The mitral valve is normal in structure and function. Trivial mitral valve regurgitation. No evidence of mitral stenosis.  5. The aortic valve is tricuspid. Aortic valve regurgitation is not visualized. Mild aortic valve sclerosis is present, with no evidence of aortic valve stenosis.  6. The inferior vena cava is dilated in size with >50% respiratory variability, suggesting right atrial pressure of 8 mmHg. FINDINGS  Left Ventricle: Left ventricular ejection fraction, by estimation, is 35 to 40%. The left ventricle has moderately decreased function. The left ventricle demonstrates global hypokinesis. The left ventricular internal cavity size was normal in size. There is no left ventricular hypertrophy. Unable to assess LV diastolic filling due ot underlying atrial fibrillation. Right Ventricle: The right ventricular size is normal. No increase in right ventricular wall thickness. Right ventricular systolic function is normal. There is normal pulmonary artery systolic pressure. The tricuspid regurgitant velocity is 2.02 m/s, and  with an assumed right atrial pressure of 8 mmHg, the estimated right ventricular systolic pressure is 06.3 mmHg. Left Atrium: Left atrial size was mildly dilated. Right Atrium: Right atrial size was normal in size. Pericardium: There is no evidence of pericardial effusion. Mitral Valve: The mitral valve is normal in structure and function. Normal mobility of the mitral valve leaflets. Moderate mitral annular  calcification. Trivial mitral valve regurgitation. No evidence of mitral valve stenosis. Tricuspid Valve: The tricuspid valve is normal in structure. Tricuspid valve regurgitation is trivial. No evidence of tricuspid stenosis. Aortic Valve: The aortic valve is tricuspid. Aortic valve regurgitation is not visualized. Mild aortic valve sclerosis is present, with no evidence of aortic valve stenosis. Pulmonic Valve: The pulmonic valve was normal in structure. Pulmonic valve regurgitation is trivial. No evidence of pulmonic stenosis. Aorta: The aortic root is normal in size and structure. Venous: The inferior vena cava is dilated in size with greater than 50% respiratory variability, suggesting right atrial pressure of 8 mmHg. IAS/Shunts: No atrial level shunt detected by color flow Doppler.  LEFT VENTRICLE PLAX 2D LVIDd:         5.20 cm LVIDs:         4.30 cm LV PW:         1.30 cm LV IVS:        1.10 cm LVOT diam:     2.30 cm LVOT Area:     4.15 cm  RIGHT VENTRICLE RV S prime:     9.14 cm/s LEFT ATRIUM             Index       RIGHT ATRIUM           Index LA diam:        4.80 cm 2.08 cm/m  RA Area:     16.10 cm LA Vol (A2C):   70.0 ml 30.35 ml/m RA Volume:   39.40 ml  17.08 ml/m LA Vol (A4C):   83.3 ml 36.12 ml/m LA Biplane Vol: 77.5 ml 33.60 ml/m   AORTA Ao Root diam: 3.30 cm Ao Asc diam:  3.70 cm TRICUSPID VALVE TR Peak grad:   16.3 mmHg TR Vmax:        202.00 cm/s  SHUNTS Systemic Diam: 2.30 cm Fransico Him MD Electronically signed by Fransico Him MD Signature Date/Time: 06/25/2019/6:00:26 PM    Final     Microbiology:  Recent Results (from the past 240 hour(s))  Blood Culture (routine x 2)     Status: None (Preliminary result)   Collection Time: 07/15/19 12:02 PM   Specimen: BLOOD LEFT FOREARM  Result Value Ref Range Status   Specimen Description BLOOD LEFT FOREARM  Final   Special Requests   Final    BOTTLES DRAWN AEROBIC AND ANAEROBIC Blood Culture results may not be optimal due to an inadequate  volume of blood received in culture bottles Performed at [facility] [address], [place], [place] [address]    Culture NO GROWTH 4 DAYS  Final   Report Status PENDING  Incomplete  Blood Culture (routine x 2)     Status: None (Preliminary result)   Collection Time: 07/15/19 12:28 PM   Specimen: BLOOD RIGHT HAND  Result Value Ref Range Status   Specimen Description BLOOD RIGHT HAND  Final   Special Requests   Final    BOTTLES DRAWN AEROBIC AND ANAEROBIC Blood Culture results may not be optimal due to an inadequate volume of blood received in culture bottles Performed at [facility], [place] [address] [place], [place] [address]    Culture NO GROWTH 4 DAYS  Final   Report Status PENDING  Incomplete  SARS CORONAVIRUS 2 (TAT 6-24 HRS) Nasopharyngeal Nasopharyngeal Swab     Status: None   Collection Time: 07/15/19  2:33 PM   Specimen: Nasopharyngeal Swab  Result Value Ref Range Status   SARS Coronavirus 2 NEGATIVE NEGATIVE Final    Comment: (NOTE) SARS-CoV-2 target nucleic acids are NOT DETECTED. The SARS-CoV-2 RNA is generally detectable in upper and lower respiratory specimens during the acute phase of infection. Negative results do not preclude SARS-CoV-2 infection, do not rule out co-infections with other pathogens, and should not be used as the sole basis for treatment or other patient management decisions. Negative results must be combined with clinical observations, patient history, and epidemiological information. The expected result is Negative. Fact Sheet for Patients: SugarRoll.be Fact Sheet for Healthcare Providers: https://www.woods-mathews.com/ This test is not yet approved or cleared by the [place] FDA and  has been authorized for detection and/or diagnosis of SARS-CoV-2 by FDA under an Emergency Use Authorization (EUA). This EUA will remain  in effect (meaning this test can be used) for the duration of  the COVID-19 declaration under Section 56 4(b)(1) of the Act, 21 U.S.C. section 360bbb-3(b)(1), unless the authorization is terminated or revoked sooner. Performed at [facility], [place] [address] [place],  [address]      Labs: Basic Metabolic Panel: Recent Labs  Lab 07/15/19 1202 07/15/19 1607 07/16/19 0345 07/17/19 0329 07/17/19 1951 07/18/19 0604 07/19/19 0324  NA 137  --  138 137  --  139 137  K 5.0  --  3.7 3.3*  --  3.0* 4.2  CL 103  --  103 104  --  105 107  CO2 22  --  21* 20*  --  22 21*  GLUCOSE 119*  --  89 85  --  91 125*  BUN 24*  --  23 16  --  12 16  CREATININE 2.24*  --  1.93* 1.51*  --  1.22 1.29*  CALCIUM 9.1  --  8.8* 8.3*  --  7.9* 7.6*  MG  --  1.1*  --   --  1.8  --   --   PHOS  --  3.0  --   --   --   --   --  Liver Function Tests: Recent Labs  Lab 07/15/19 1202  AST 54*  ALT 34  ALKPHOS 61  BILITOT 1.0  PROT 6.5  ALBUMIN 2.9*   No results for input(s): LIPASE, AMYLASE in the last 168 hours. No results for input(s): AMMONIA in the last 168 hours. CBC: Recent Labs  Lab 07/15/19 1202 07/16/19 0345 07/17/19 0329 07/18/19 0604  WBC 5.2 5.7 6.5 4.9  NEUTROABS 4.0  --   --   --   HGB 15.3 15.2 13.8 13.8  HCT 47.5 47.0 41.5 40.7  MCV 98.3 98.1 93.5 93.3  PLT 147* 134* PLATELET CLUMPS NOTED ON SMEAR, UNABLE TO ESTIMATE 115*    CBG: Recent Labs  Lab 07/18/19 1029 07/18/19 1227 07/18/19 2150 07/19/19 0759 07/19/19 1156  GLUCAP 91 86 166* 107* 148*       Signed:  Oswald Hillock MD.  [facility] 07/19/2019, 2:17 PM

## 2019-07-19 NOTE — Consult Note (Signed)
   [facility] CM Inpatient Consult   07/19/2019  Jonathon [name] Sep 03, 1950 [id number]  Patient screened for high risk score for unplanned readmission score of 23% and for less than 30 days readmission hospitalization.    Patient was contacted by hospital phone, HIPAA verified, to check if potential [place] Management services are needed.  Patient is listed in the Medicare [place] [employer] [ACO] with [facility].  Primary Care Provider [PCP] islisted [name], PA-C with [facility] and this office does the transition of care follow up, confirmed of PCP is [facility].   Explained [facility] Care Management services involvement with post hospital follow up care with Medicare benefits. Patient made aware of post hospital calls may connect him with [facility]. Patient declines any needs for assigned nurse follow up at this time for chronic disease management.  For questions contact:   Natividad Brood, RN BSN [facility] Liaison  [phone] business mobile phone Toll free office [phone]  Fax number: [phone] [email or web address]

## 2019-07-19 NOTE — TOC Transition Note (Signed)
Transition of Care [facility]) - CM/SW Discharge Note   Patient Details  Name: Jonathon [name] MRN: [id number] Date of Birth: 02-11-1951  Transition of Care [facility]) CM/SW Contact:  Bethena Roys, RN Phone Number: 07/19/2019, 3:33 PM   Clinical Narrative:  Case Manager spoke with patient regarding home health needs. No Physical Therapy needs at this time. Wife wanted a home health nurse for medication management and wound care. Medicare.gov list provided to patient and first choice was [facility]- unable to accept the patient due to staffing in [place]. Second Choice [facility] and they can accept the patient for home health services- start of care to begin within 24-48 hours post transition home. Durable Medical Equipment (DME) Rolling Walker to be delivered to the room from [place] prior to transition home. Wife to provide transportation home via private vehicle. No further needs from Case Manager at this time.     Final next level of care: [place] Barriers to Discharge: Barriers Resolved   Patient Goals and CMS Choice Patient states their goals for this hospitalization and ongoing recovery are:: to be able to walk and get rid of infection CMS Medicare.gov Compare Post Acute Care list provided to:: Patient Choice offered to / list presented to : Patient  Discharge Plan and Services In-house Referral: NA Discharge Planning Services: CM Consult Post Acute Care Choice: Durable Medical Equipment, Home Health          DME Arranged: Walker rolling DME Agency: [facility] Date DME Agency Contacted: 07/19/19 Time DME Agency Contacted: 1500 Representative spoke with at DME Agency: [place]: RN, Disease Management(Medication management and wound care. No HH PT needs.) HH Agency: [place] Date [facility] Agency Contacted: 07/19/19 Time [place]: [phone] Representative spoke with at [place]: [facility]  Readmission Risk  Interventions Readmission Risk Prevention Plan 07/18/2019  Transportation Screening Complete  PCP or Specialist Appt within 3-5 Days Complete  Palliative Care Screening Not Applicable

## 2019-07-20 LAB — CULTURE, BLOOD (ROUTINE X 2)
Culture: NO GROWTH
Culture: NO GROWTH

## 2019-07-21 ENCOUNTER — Encounter: Payer: Medicare Other | Admitting: Vascular Surgery

## 2019-07-21 DIAGNOSIS — N179 Acute kidney failure, unspecified: Secondary | ICD-10-CM | POA: Diagnosis not present

## 2019-07-21 DIAGNOSIS — K219 Gastro-esophageal reflux disease without esophagitis: Secondary | ICD-10-CM | POA: Diagnosis not present

## 2019-07-21 DIAGNOSIS — D696 Thrombocytopenia, unspecified: Secondary | ICD-10-CM | POA: Diagnosis not present

## 2019-07-21 DIAGNOSIS — F411 Generalized anxiety disorder: Secondary | ICD-10-CM | POA: Diagnosis not present

## 2019-07-21 DIAGNOSIS — D631 Anemia in chronic kidney disease: Secondary | ICD-10-CM | POA: Diagnosis not present

## 2019-07-21 DIAGNOSIS — N400 Enlarged prostate without lower urinary tract symptoms: Secondary | ICD-10-CM | POA: Diagnosis not present

## 2019-07-21 DIAGNOSIS — E1152 Type 2 diabetes mellitus with diabetic peripheral angiopathy with gangrene: Secondary | ICD-10-CM | POA: Diagnosis not present

## 2019-07-21 DIAGNOSIS — I088 Other rheumatic multiple valve diseases: Secondary | ICD-10-CM | POA: Diagnosis not present

## 2019-07-21 DIAGNOSIS — D62 Acute posthemorrhagic anemia: Secondary | ICD-10-CM | POA: Diagnosis not present

## 2019-07-21 DIAGNOSIS — E039 Hypothyroidism, unspecified: Secondary | ICD-10-CM | POA: Diagnosis not present

## 2019-07-21 DIAGNOSIS — Z48812 Encounter for surgical aftercare following surgery on the circulatory system: Secondary | ICD-10-CM | POA: Diagnosis not present

## 2019-07-21 DIAGNOSIS — I0981 Rheumatic heart failure: Secondary | ICD-10-CM | POA: Diagnosis not present

## 2019-07-21 DIAGNOSIS — E7800 Pure hypercholesterolemia, unspecified: Secondary | ICD-10-CM | POA: Diagnosis not present

## 2019-07-21 DIAGNOSIS — I70261 Atherosclerosis of native arteries of extremities with gangrene, right leg: Secondary | ICD-10-CM | POA: Diagnosis not present

## 2019-07-21 DIAGNOSIS — E785 Hyperlipidemia, unspecified: Secondary | ICD-10-CM | POA: Diagnosis not present

## 2019-07-21 DIAGNOSIS — N183 Chronic kidney disease, stage 3 unspecified: Secondary | ICD-10-CM | POA: Diagnosis not present

## 2019-07-21 DIAGNOSIS — I5022 Chronic systolic (congestive) heart failure: Secondary | ICD-10-CM | POA: Diagnosis not present

## 2019-07-21 DIAGNOSIS — Z4781 Encounter for orthopedic aftercare following surgical amputation: Secondary | ICD-10-CM | POA: Diagnosis not present

## 2019-07-21 DIAGNOSIS — M1030 Gout due to renal impairment, unspecified site: Secondary | ICD-10-CM | POA: Diagnosis not present

## 2019-07-21 DIAGNOSIS — E1140 Type 2 diabetes mellitus with diabetic neuropathy, unspecified: Secondary | ICD-10-CM | POA: Diagnosis not present

## 2019-07-21 DIAGNOSIS — E876 Hypokalemia: Secondary | ICD-10-CM | POA: Diagnosis not present

## 2019-07-21 DIAGNOSIS — I482 Chronic atrial fibrillation, unspecified: Secondary | ICD-10-CM | POA: Diagnosis not present

## 2019-07-21 DIAGNOSIS — E1122 Type 2 diabetes mellitus with diabetic chronic kidney disease: Secondary | ICD-10-CM | POA: Diagnosis not present

## 2019-07-21 DIAGNOSIS — Z4801 Encounter for change or removal of surgical wound dressing: Secondary | ICD-10-CM | POA: Diagnosis not present

## 2019-07-21 DIAGNOSIS — I130 Hypertensive heart and chronic kidney disease with heart failure and stage 1 through stage 4 chronic kidney disease, or unspecified chronic kidney disease: Secondary | ICD-10-CM | POA: Diagnosis not present

## 2019-07-26 DIAGNOSIS — Z89419 Acquired absence of unspecified great toe: Secondary | ICD-10-CM | POA: Diagnosis not present

## 2019-07-26 DIAGNOSIS — N183 Chronic kidney disease, stage 3 unspecified: Secondary | ICD-10-CM | POA: Diagnosis not present

## 2019-07-26 DIAGNOSIS — E875 Hyperkalemia: Secondary | ICD-10-CM | POA: Diagnosis not present

## 2019-07-26 DIAGNOSIS — I739 Peripheral vascular disease, unspecified: Secondary | ICD-10-CM | POA: Diagnosis not present

## 2019-07-26 DIAGNOSIS — Z7984 Long term (current) use of oral hypoglycemic drugs: Secondary | ICD-10-CM | POA: Diagnosis not present

## 2019-07-26 DIAGNOSIS — R319 Hematuria, unspecified: Secondary | ICD-10-CM | POA: Diagnosis not present

## 2019-07-26 DIAGNOSIS — I10 Essential (primary) hypertension: Secondary | ICD-10-CM | POA: Diagnosis not present

## 2019-07-26 DIAGNOSIS — E1122 Type 2 diabetes mellitus with diabetic chronic kidney disease: Secondary | ICD-10-CM | POA: Diagnosis not present

## 2019-07-26 DIAGNOSIS — N390 Urinary tract infection, site not specified: Secondary | ICD-10-CM | POA: Diagnosis not present

## 2019-07-26 NOTE — Progress Notes (Signed)
HEMATOLOGY/ONCOLOGY CONSULTATION NOTE  Date of Service: 07/26/2019  Patient Care Team: Jonathon [name] as PCP - General (Physician Assistant) [name], Jonathon Freer, [name] as PCP - Cardiology (Internal Medicine)  REFERRING PHYSICIAN: Aurea Graff, PA-C  CHIEF COMPLAINTS/PURPOSE OF CONSULTATION:  DVT   HISTORY OF PRESENTING ILLNESS:   Jonathon [name] is a wonderful 69 y.o. male who has been referred to [place] by Jonathon Melter [name]  for evaluation and management of DVT. Pt is accompanied today by Jonathon [name]. The pt reports that he is doing well overall.   The pt reports he is good. Pt has had both doses of the COVID19 vaccine. He was having pain in the right leg. 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 At the beginning of February he saw that the right big toe was bright red and could see the redness was moving up the leg. Pt had an US of the leg and they found a clot. He has been placed on Xarelto. Pt has never had clots related to surgeries. He is still on a testosterone shots for about 6 months.   He is up to date with his age appropriate cancer screenings. About three years ago the pt developed a shortening or tightening of achilles tendon and started going to PT, but they have gotten better. Pt also had an adrenalectomy years ago. Previously he had been physically active. Pt had gout one time a while ago but is still taking gout medication.   Most recent lab results (07/18/29) of CMP is as follows: all values are WNL except for CO2 at 21, Glucose at 125, Creatinine at 1.29, Calcium at 7.6, GFR calc non Af Amer at 56 07/18/19 of CBC is as follows: all values are WNL except for Platelets at 115K,  07/19/19 of Glucose-Capillary at 148  On review of systems, pt denies irregular bowl movement, bleeding concerns, unexpected weight loss and any other symptoms.   On PMHx the pt reports atrial fibrillation, Right great toe amputation, adrenalectomy    On Social Hx the pt  reports smoking (quit in 1979)   On Family Hx the pt reports daughter has a hyper coagulative disorder (PAI-1 4g-5g Polymorphism), father had stroke    MEDICAL HISTORY:  Past Medical History:  Diagnosis Date  . Anemia due to blood loss, acute 2014   2nd GIB  . Anxiety   . CKD (chronic kidney disease), stage III 2014  . Diabetes mellitus without complication ([place])   . Gout   . High cholesterol   . Hypertension 2014  . Hypothyroid   . Neuropathy   . PUD (peptic ulcer disease) 2014     SURGICAL HISTORY: Past Surgical History:  Procedure Laterality Date  . ABDOMINAL AORTOGRAM W/LOWER EXTREMITY Bilateral 06/27/2019   Procedure: ABDOMINAL AORTOGRAM W/LOWER EXTREMITY;  Surgeon: Jonathon Dutch, [name];  Location: [facility];  Service: Vascular;  Laterality: Bilateral;  . ADRENALECTOMY    . AMPUTATION Right 07/18/2019   Procedure: AMPUTATION RIGHT GREAT TOE;  Surgeon: Jonathon Heck, [name];  Location: [facility];  Service: Vascular;  Laterality: Right;  . EMBOLECTOMY Right 06/27/2019   Procedure: RIGHT LEG EMBOLECTOMY;  Surgeon: Jonathon Dutch, [name];  Location: [facility];  Service: Vascular;  Laterality: Right;     SOCIAL HISTORY: Social History   Socioeconomic History  . Marital status: Married    Spouse name: Not on file  . Number of children: Not on file  . Years of education: Not  on file  . Highest education level: Not on file  Occupational History  . Not on file  Tobacco Use  . Smoking status: Former Research scientist (life sciences)  . Smokeless tobacco: Never Used  Substance and Sexual Activity  . Alcohol use: Yes    Comment: daily  . Drug use: Never  . Sexual activity: Not on file  Other Topics Concern  . Not on file  Social History Narrative  . Not on file   Social Determinants of Health   Financial Resource Strain:   . Difficulty of Paying Living Expenses:   Food Insecurity:   . Worried About Charity fundraiser in the Last Year:   . Arboriculturist in the Last Year:     Transportation Needs:   . Film/video editor (Medical):   [name] Lack of Transportation (Non-Medical):   Physical Activity:   . Days of Exercise per Week:   . Minutes of Exercise per Session:   Stress:   . Feeling of Stress :   Social Connections:   . Frequency of Communication with Friends and Family:   . Frequency of Social Gatherings with Friends and Family:   . Attends Religious Services:   . Active Member of Clubs or Organizations:   . Attends Archivist Meetings:   [name] Marital Status:   Intimate Partner Violence:   . Fear of Current or Ex-Partner:   . Emotionally Abused:   [name] Physically Abused:   . Sexually Abused:      FAMILY HISTORY: Family History  Problem Relation Age of Onset  . Diabetes Mellitus II Maternal Aunt      ALLERGIES:   is allergic to morphine and related.   MEDICATIONS:  Current Outpatient Medications  Medication Sig Dispense Refill  . ALPRAZolam (XANAX) 0.25 MG tablet Take 0.25 mg by mouth at bedtime.     [name] aspirin EC 81 MG EC tablet Take 1 tablet (81 mg total) by mouth daily.    [name] atorvastatin (LIPITOR) 40 MG tablet Take 40 mg by mouth at bedtime.     . colchicine 0.6 MG tablet Take 0.6 mg by mouth at bedtime.     [name] doxazosin (CARDURA) 4 MG tablet Take 4 mg by mouth at bedtime.     . febuxostat (ULORIC) 40 MG tablet Take 40 mg by mouth at bedtime.     . fenofibrate 54 MG tablet Take 1 tablet (54 mg total) by mouth daily. 30 tablet 2  . JARDIANCE 10 MG TABS tablet Take 10 mg by mouth at bedtime.     [name] levothyroxine (SYNTHROID) 112 MCG tablet Take 112 mcg by mouth at bedtime.     [name] losartan (COZAAR) 25 MG tablet TAKE 1/2 TABLET(12.5 MG) BY MOUTH DAILY (Patient taking differently: Take 12.5 mg by mouth daily. ) 45 tablet 3  . metFORMIN (GLUCOPHAGE-XR) 500 MG 24 hr tablet Take 1,000 mg by mouth at bedtime.     . metoprolol succinate (TOPROL-XL) 100 MG 24 hr tablet TAKE 1 TABLET(100 MG) BY MOUTH DAILY WITH OR IMMEDIATELY FOLLOWING A MEAL  (Patient taking differently: Take 100 mg by mouth daily. ) 90 tablet 3  . oxybutynin (DITROPAN-XL) 10 MG 24 hr tablet Take 10 mg by mouth at bedtime.     [name] oxyCODONE (OXY IR/ROXICODONE) 5 MG immediate release tablet Take 1 tablet (5 mg total) by mouth every 6 (six) hours as needed for moderate pain. 20 tablet 0  . pantoprazole (PROTONIX) 40 MG tablet Take 40  mg by mouth 2 (two) times daily.     [name] PARoxetine (PAXIL) 40 MG tablet Take 40 mg by mouth at bedtime.     . pregabalin (LYRICA) 75 MG capsule Take 150 mg by mouth at bedtime.     . rivaroxaban (XARELTO) 20 MG TABS tablet Take 1 tablet (20 mg total) by mouth daily with supper. 30 tablet 6  . temazepam (RESTORIL) 15 MG capsule Take 15 mg by mouth at bedtime.     [name] testosterone cypionate (DEPOTESTOTERONE CYPIONATE) 100 MG/ML injection Inject 200 mg into the muscle every 14 (fourteen) days.      No current facility-administered medications for this visit.     REVIEW OF SYSTEMS:   A 10+ POINT REVIEW OF SYSTEMS WAS OBTAINED including neurology, dermatology, psychiatry, cardiac, respiratory, lymph, extremities, GI, GU, Musculoskeletal, constitutional, breasts, reproductive, HEENT.  All pertinent positives are noted in the HPI.  All others are negative.    PHYSICAL EXAMINATION: ECOG PERFORMANCE STATUS: 1 - Symptomatic but completely ambulatory  Vitals:   07/27/19 1308  BP: 112/75  Pulse: 72  Resp: 18  Temp: 98.9 F (37.2 C)  SpO2: 99%   Filed Weights   07/27/19 1308  Weight: 237 lb 6.4 oz (107.7 kg)   Body mass index is 32.2 kg/m.  GENERAL:alert, in no acute distress and comfortable SKIN: no acute rashes, no significant lesions EYES: conjunctiva are pink and non-injected, sclera anicteric OROPHARYNX: MMM, no exudates, no oropharyngeal erythema or ulceration NECK: supple, no JVD LYMPH:  no palpable lymphadenopathy in the cervical, axillary or inguinal regions LUNGS: clear to auscultation b/l with normal respiratory  effort HEART: regular rate & rhythm ABDOMEN:  normoactive bowel sounds , non tender, not distended. Extremity: no pedal edema PSYCH: alert & oriented x 3 with fluent speech NEURO: no focal motor/sensory deficits   LABORATORY DATA:  I have reviewed the data as listed  CBC Latest Ref Rng & Units 07/18/2019 07/17/2019 07/16/2019  WBC 4.0 - 10.5 K/uL 4.9 6.5 5.7  Hemoglobin 13.0 - 17.0 g/dL 13.8 13.8 15.2  Hematocrit 39.0 - 52.0 % 40.7 41.5 47.0  Platelets 150 - 400 K/uL 115(L) PLATELET CLUMPS NOTED ON SMEAR, UNABLE TO ESTIMATE 134(L)    CMP Latest Ref Rng & Units 07/19/2019 07/18/2019 07/17/2019  Glucose 70 - 99 mg/dL 125(H) 91 85  BUN 8 - 23 mg/dL 16 12 16   Creatinine 0.61 - 1.24 mg/dL 1.29(H) 1.22 1.51(H)  Sodium 135 - 145 mmol/L 137 139 137  Potassium 3.5 - 5.1 mmol/L 4.2 3.0(L) 3.3(L)  Chloride 98 - 111 mmol/L 107 105 104  CO2 22 - 32 mmol/L 21(L) 22 20(L)  Calcium 8.9 - 10.3 mg/dL 7.6(L) 7.9(L) 8.3(L)  Total Protein 6.5 - 8.1 g/dL - - -  Total Bilirubin 0.3 - 1.2 mg/dL - - -  Alkaline Phos 38 - 126 U/L - - -  AST 15 - 41 U/L - - -  ALT 0 - 44 U/L - - -    RADIOGRAPHIC STUDIES: I have personally reviewed the radiological images as listed and agreed with the findings in the report. PERIPHERAL VASCULAR CATHETERIZATION  Result Date: 06/27/2019 Procedure: Aortogram with bilateral lower extremity runoff Preoperative diagnosis: Ischemia right foot Postoperative diagnosis: Same Anesthesia: Local Operative findings: Occlusion right superficial femoral popliteal posterior tibial peroneal arteries with what appears to be embolic material one-vessel AT runoff Operative details: After team informed consent, patient taken the [place] lab.  The patient placed in supine position angio table.  Both groins  were prepped and draped usual sterile fashion.  Local anesthesia was infiltrated of the left common femoral artery.  Ultrasound was used to identify left common femoral artery and femoral bifurcation.   An introducer needle was used to cannulate the left common femoral artery and 035 Bentson wire advanced up the abdominal aorta under fluoroscopic guidance.  A 5 French sheath placed over the guidewire in the left common femoral artery.  This was thoroughly flushed with heparinized saline.  5 French pigtail catheter was advanced over the guidewire and abdominal aortogram was obtained AP projection.  Left and right renal arteries are patent.  Infrarenal abdominal aorta is patent.  Left and right common external and internal iliac arteries are all widely patent. Next pigtail catheter was pulled down just above the aortic bifurcation pelvic angiogram was obtained to confirm the above findings. At this point the pigtail catheter was removed over guidewire and exchanged for a 5 [place] crossover catheter.  This was used to selectively catheterize the right common iliac and then the guidewire advanced down into the distal right external iliac artery.  Crossover catheter was swapped out for a 5 [place] straight catheter.  Right lower extremity arteriogram was then obtained.  The right common femoral profunda femoris are all patent.  The right superficial femoral artery occludes about 5 cm after its origin with a meniscus suggestive of embolic material.  The entire SFA is occluded.  There is a small island of contrast opacification in the popliteal artery.  The remainder of the popliteal artery is occluded and is very irregular in its surface in the below-knee segment.  The contrast does partially opacified the peroneal and posterior tibial arteries but they appear to have embolic material within them as well.  The anterior tibial artery is patent throughout its course. At this point 5 French straight catheter was removed and left lower extremity arteriogram was obtained through the sheath.  The left common femoral profundofemoral and superficial femoral arteries are all patent.  Left popliteal artery is patent.  Left anterior  tibial artery is the dominant runoff vessel to the left foot the peroneal and posterior tibial arteries are patent but are smaller in caliber.  At this point the procedure was concluded.  The patient tolerated procedure well and there were complications.  Patient was taken to the operating room for right leg embolectomy today. [name], [name] Office: [phone]  DG Chest Port 1 View  Result Date: 07/15/2019 CLINICAL DATA:  History of open wound. EXAM: PORTABLE CHEST 1 VIEW COMPARISON:  Chest x-ray 08/05/2017. FINDINGS: Mediastinum hilar structures normal. Lungs are clear. No pleural effusion or pneumothorax. Heart size stable. No acute bony abnormality identified. IMPRESSION: 1.  Cardiomegaly.  No pulmonary venous congestion. 2.  No acute pulmonary disease. Electronically Signed   By: Marcello Moores  Register   On: 07/15/2019 12:32   DG Toe Great Right  Result Date: 07/15/2019 CLINICAL DATA:  Open wound EXAM: RIGHT FIRST TOE: 3 V COMPARISON:  Right foot June 08, 2019 FINDINGS: Frontal, oblique, and lateral views were obtained. Soft tissue air is noted along the lateral aspect of the first digit. There is no erosive change or bony destruction appreciable. No fracture or dislocation. There is slight narrowing of the first MTP joint. Other joint spaces appear normal. IMPRESSION: Soft tissue air along the lateral aspect of the first digit. Mild osteoarthritic change first MTP joint. No bony destruction. No fracture or dislocation. Electronically Signed   By: Gwyndolyn Saxon  Jasmine December III M.D.   On: 07/15/2019 12:34   VAS [place] ABI WITH/WO TBI  Result Date: 06/28/2019 LOWER EXTREMITY DOPPLER STUDY Indications: Post op right leg embolectomy 06-27-19 with drain.  Limitations: Today's exam was limited due to A. Fib. Comparison Study: No prior exam. Performing Technologist: Baldwin Crown RDMS  Examination Guidelines: A complete evaluation includes at minimum, Doppler waveform  signals and systolic blood pressure reading at the level of bilateral brachial, anterior tibial, and posterior tibial arteries, when vessel segments are accessible. Bilateral testing is considered an integral part of a complete examination. Photoelectric Plethysmograph (PPG) waveforms and toe systolic pressure readings are included as required and additional duplex testing as needed. Limited examinations for reoccurring indications may be performed as noted.  ABI Findings: +---------+------------------+-----+---------+---------+ Right    Rt Pressure (mmHg)IndexWaveform Comment   +---------+------------------+-----+---------+---------+ Brachial 111                    triphasic          +---------+------------------+-----+---------+---------+ PTA      134               1.21 triphasic          +---------+------------------+-----+---------+---------+ DP       144               1.30 triphasic          +---------+------------------+-----+---------+---------+ Great Toe                       Absent   black toe +---------+------------------+-----+---------+---------+ +---------+------------------+-----+---------+-------+ Left     Lt Pressure (mmHg)IndexWaveform Comment +---------+------------------+-----+---------+-------+ Brachial 111                    triphasic        +---------+------------------+-----+---------+-------+ PTA      143               1.29 triphasic        +---------+------------------+-----+---------+-------+ DP       140               1.26 triphasic        +---------+------------------+-----+---------+-------+ Theodoro Parma               1.02 Normal           +---------+------------------+-----+---------+-------+ +-------+-----------+-----------+------------+------------+ ABI/TBIToday's ABIToday's TBIPrevious ABIPrevious TBI +-------+-----------+-----------+------------+------------+ Right  1.3                                             +-------+-----------+-----------+------------+------------+ Left   1.29       1.02                                +-------+-----------+-----------+------------+------------+ Patient is currently in Atrial Fib.  Summary: Right: Resting right ankle-brachial index is within normal range. No evidence of significant right lower extremity arterial disease. Absent waveform in right great toe. Left: Resting left ankle-brachial index is within normal range. No evidence of significant left lower extremity arterial disease. The left toe-brachial index is normal.  *See table(s) above for measurements and observations.  Electronically signed by Deitra Mayo [name] on 06/28/2019 at 4:44:56 PM.   Final     [place] ext venous :06/08/2019 IMPRESSION: Nonocclusive thrombus in the distal right femoral vein. The remainder  of the deep venous system appears is patent without thrombus.  Electronically Signed: By: Audie Pinto M.D. On: 06/08/2019 19:52   ASSESSMENT & PLAN:    Reid Regas is a 69 y.o. male with:  1. 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 Rt superficial femoral artery occlusion -- likely embolic clot from atrial fibrillation. S/p embolectomy.  PLAN: -Discussed patient's most recent labs from 07/19/19, of CMP is as follows: all values are WNL except for CO2 at 21, Glucose at 125, Creatinine at 1.29, Calcium at 7.6, GFR calc non Af Amer at 56 07/18/19 of CBC is as follows: all values are WNL except for Platelets at 115K,  07/19/19 of Glucose-Capillary at 148 -Advised/educated on blood clot risk factors -Advised testosterone shot is possible blood clot risk factor for his DVT -Advised on clots in the artery-possibly coming from heart- atrial fibrillation and CHF is a risk factor -Advised on CHADS score as a risk profile for CVA risk from afib -Advised on clots  in the vein and factors   -Advised unlikely an inherited clotting disorder -Pt wants to get tested for inherited disorder  -Recommends getting age appropriate cancer screenings with PCP -Recommends staying physically active- once wound is healed  -F/u with PCP for gout medication use, and discussing risk vs benefit of continued testosterone replacement. -patient will need ongoing anticoagulation with Xarelto for his atrial fibrillation irrespective of whether the DVT requires long term anticoagulation. -Will get labs for hypercoag w/u as noted below -F/u with phone visit    [place] today Phone visit with Dr Irene Limbo in 10-12 days  . Orders Placed This Encounter  Procedures  . Factor 5 leiden    Standing Status:   Future    Number of Occurrences:   1    Standing Expiration Date:   07/26/2020  . Prothrombin gene mutation    Standing Status:   Future    Number of Occurrences:   1    Standing Expiration Date:   08/30/2020  . Lupus anticoagulant panel    Standing Status:   Future    Number of Occurrences:   1    Standing Expiration Date:   08/30/2020  . Cardiolipin antibodies, IgG, IgM, IgA    Standing Status:   Future    Number of Occurrences:   1    Standing Expiration Date:   07/26/2020  . Beta-2-glycoprotein i abs, IgG/M/A    Standing Status:   Future    Number of Occurrences:   1    Standing Expiration Date:   07/26/2020    The total time spent in the appt was 60 minutes and more than 50% was on counseling and direct patient cares.  All of the patient's questions were answered with apparent satisfaction. The patient knows to call the clinic with any problems, questions or concerns.    Sullivan Lone MD MS AAHIVMS [facility] Hematology/Oncology Physician [facility]  (Office):       [phone] (Work cell):  [phone] (Fax):           [phone]  07/26/2019 3:20 PM  I, Dawayne Cirri am acting as a Education administrator for Dr. Sullivan Lone.   .I have reviewed the above  documentation for accuracy and completeness, and I agree with the above. Brunetta Genera [name]

## 2019-07-27 ENCOUNTER — Other Ambulatory Visit: Payer: Self-pay

## 2019-07-27 ENCOUNTER — Inpatient Hospital Stay: Payer: Medicare Other | Attending: Hematology | Admitting: Hematology

## 2019-07-27 ENCOUNTER — Inpatient Hospital Stay: Payer: Medicare Other

## 2019-07-27 VITALS — BP 112/75 | HR 72 | Temp 98.9°F | Resp 18 | Ht 72.0 in | Wt 237.4 lb

## 2019-07-27 DIAGNOSIS — E1140 Type 2 diabetes mellitus with diabetic neuropathy, unspecified: Secondary | ICD-10-CM | POA: Insufficient documentation

## 2019-07-27 DIAGNOSIS — Z823 Family history of stroke: Secondary | ICD-10-CM | POA: Diagnosis not present

## 2019-07-27 DIAGNOSIS — I749 Embolism and thrombosis of unspecified artery: Secondary | ICD-10-CM

## 2019-07-27 DIAGNOSIS — Z79899 Other long term (current) drug therapy: Secondary | ICD-10-CM | POA: Diagnosis not present

## 2019-07-27 DIAGNOSIS — Z87891 Personal history of nicotine dependence: Secondary | ICD-10-CM | POA: Insufficient documentation

## 2019-07-27 DIAGNOSIS — E896 Postprocedural adrenocortical (-medullary) hypofunction: Secondary | ICD-10-CM | POA: Diagnosis not present

## 2019-07-27 DIAGNOSIS — I82411 Acute embolism and thrombosis of right femoral vein: Secondary | ICD-10-CM | POA: Diagnosis not present

## 2019-07-27 DIAGNOSIS — Z4801 Encounter for change or removal of surgical wound dressing: Secondary | ICD-10-CM | POA: Diagnosis not present

## 2019-07-27 DIAGNOSIS — Z832 Family history of diseases of the blood and blood-forming organs and certain disorders involving the immune mechanism: Secondary | ICD-10-CM | POA: Diagnosis not present

## 2019-07-27 DIAGNOSIS — Z79890 Hormone replacement therapy (postmenopausal): Secondary | ICD-10-CM | POA: Diagnosis not present

## 2019-07-27 DIAGNOSIS — I4891 Unspecified atrial fibrillation: Secondary | ICD-10-CM | POA: Insufficient documentation

## 2019-07-27 DIAGNOSIS — Z7901 Long term (current) use of anticoagulants: Secondary | ICD-10-CM | POA: Insufficient documentation

## 2019-07-27 DIAGNOSIS — E1152 Type 2 diabetes mellitus with diabetic peripheral angiopathy with gangrene: Secondary | ICD-10-CM | POA: Diagnosis not present

## 2019-07-27 DIAGNOSIS — Z4781 Encounter for orthopedic aftercare following surgical amputation: Secondary | ICD-10-CM | POA: Diagnosis not present

## 2019-07-27 DIAGNOSIS — N183 Chronic kidney disease, stage 3 unspecified: Secondary | ICD-10-CM | POA: Insufficient documentation

## 2019-07-27 DIAGNOSIS — I70261 Atherosclerosis of native arteries of extremities with gangrene, right leg: Secondary | ICD-10-CM | POA: Diagnosis not present

## 2019-07-27 DIAGNOSIS — I130 Hypertensive heart and chronic kidney disease with heart failure and stage 1 through stage 4 chronic kidney disease, or unspecified chronic kidney disease: Secondary | ICD-10-CM | POA: Insufficient documentation

## 2019-07-27 DIAGNOSIS — Z48812 Encounter for surgical aftercare following surgery on the circulatory system: Secondary | ICD-10-CM | POA: Diagnosis not present

## 2019-07-27 DIAGNOSIS — M109 Gout, unspecified: Secondary | ICD-10-CM | POA: Diagnosis not present

## 2019-07-27 DIAGNOSIS — Z7984 Long term (current) use of oral hypoglycemic drugs: Secondary | ICD-10-CM | POA: Insufficient documentation

## 2019-07-27 NOTE — Patient Instructions (Signed)
Thank you for choosing [facility] to provide your oncology and hematology care.   Should you have questions after your visit to the [facility] ([facility]), please contact this office at [phone] between 8:30 AM and 4:30 PM.  Voice mails left after 4:00 PM may not be returned until the following business day.  Calls received after 4:30 PM will be answered by an off-site Nurse Triage Line.    Prescription Refills:  Please have your pharmacy contact us directly for most prescription requests.  Contact the office directly for refills of narcotics (pain medications). Allow 48-72 hours for refills.  Appointments: Please contact the [facility] scheduling department [phone] for questions regarding [facility] appointment scheduling.  Contact the schedulers with any scheduling changes so that your appointment can be rescheduled in a timely manner.   Central Scheduling for [facility] [phone] - Call to schedule procedures such as PET scans, CT scans, MRI, Ultrasound, etc.  To afford each patient quality time with our providers, please arrive 30 minutes before your scheduled appointment time.  If you arrive late for your appointment, you may be asked to reschedule.  We strive to give you quality time with our providers, and arriving late affects you and other patients whose appointments are after yours. If you are a no show for multiple scheduled visits, you may be dismissed from the clinic at the providers discretion.     Resources: [facility] Social Workers [phone] for additional information on assistance programs or assistance connecting with community support programs   [place] DSS  [phone]: Information regarding food stamps, Medicaid, and utility assistance SCAT [phone]   [place] [employer]'s shared-ride transportation service for eligible riders who have a disability that prevents them from riding the fixed route bus.   Medicare Rights Center  800-333-4114 Helps people with Medicare understand their rights and benefits, navigate the Medicare system, and secure the quality healthcare they deserve [employer] 800-227-2345 Assists patients locate various types of support and financial assistance Cancer Care: 1-800-813-HOPE (4673) Provides financial assistance, online support groups, medication/co-pay assistance.   Transportation Assistance for appointments at [facility]: Transportation Coordinator [phone]  Again, thank you for choosing [facility] for your care.       

## 2019-07-29 ENCOUNTER — Encounter: Payer: Self-pay | Admitting: Hematology

## 2019-07-29 ENCOUNTER — Telehealth: Payer: Self-pay | Admitting: *Deleted

## 2019-07-29 DIAGNOSIS — Z4781 Encounter for orthopedic aftercare following surgical amputation: Secondary | ICD-10-CM | POA: Diagnosis not present

## 2019-07-29 DIAGNOSIS — I130 Hypertensive heart and chronic kidney disease with heart failure and stage 1 through stage 4 chronic kidney disease, or unspecified chronic kidney disease: Secondary | ICD-10-CM | POA: Diagnosis not present

## 2019-07-29 DIAGNOSIS — I70261 Atherosclerosis of native arteries of extremities with gangrene, right leg: Secondary | ICD-10-CM | POA: Diagnosis not present

## 2019-07-29 DIAGNOSIS — E1152 Type 2 diabetes mellitus with diabetic peripheral angiopathy with gangrene: Secondary | ICD-10-CM | POA: Diagnosis not present

## 2019-07-29 DIAGNOSIS — Z4801 Encounter for change or removal of surgical wound dressing: Secondary | ICD-10-CM | POA: Diagnosis not present

## 2019-07-29 DIAGNOSIS — Z48812 Encounter for surgical aftercare following surgery on the circulatory system: Secondary | ICD-10-CM | POA: Diagnosis not present

## 2019-07-29 LAB — CARDIOLIPIN ANTIBODIES, IGG, IGM, IGA
Anticardiolipin IgA: <9 APL U/mL (ref 0–11)
Anticardiolipin IgG: <9 GPL U/mL (ref 0–14)
Anticardiolipin IgM: <9 MPL U/mL (ref 0–12)

## 2019-07-29 LAB — LUPUS ANTICOAGULANT PANEL
DRVVT: 136.2 s — ABNORMAL HIGH (ref 0.0–47.0)
PTT Lupus Anticoagulant: 51 s (ref 0.0–51.9)

## 2019-07-29 LAB — DRVVT CONFIRM: dRVVT Confirm: 1.9 ratio — ABNORMAL HIGH (ref 0.8–1.2)

## 2019-07-29 LAB — BETA-2-GLYCOPROTEIN I ABS, IGG/M/A
Beta-2 Glyco I IgG: <9 GPI IgG units (ref 0–20)
Beta-2-Glycoprotein I IgA: <9 GPI IgA units (ref 0–25)
Beta-2-Glycoprotein I IgM: <9 GPI IgM units (ref 0–32)

## 2019-07-29 LAB — DRVVT MIX: dRVVT Mix: 72.9 s — ABNORMAL HIGH (ref 0.0–40.4)

## 2019-07-29 NOTE — Telephone Encounter (Signed)
Patient sent MyChart message asking " what is the impact of this" Contacted patient - spoke with patient and wife to clarify question. Patient wanted to know about the lab tests that have resulted so far. Advised them that Dr.Kale will review all lab tests and correlate information with plan to discuss during phone appointment on 4/13. They verbalized understanding.

## 2019-08-01 LAB — PROTHROMBIN GENE MUTATION

## 2019-08-01 LAB — FACTOR 5 LEIDEN

## 2019-08-02 DIAGNOSIS — R8290 Unspecified abnormal findings in urine: Secondary | ICD-10-CM | POA: Diagnosis not present

## 2019-08-02 DIAGNOSIS — E291 Testicular hypofunction: Secondary | ICD-10-CM | POA: Diagnosis not present

## 2019-08-03 DIAGNOSIS — Z4781 Encounter for orthopedic aftercare following surgical amputation: Secondary | ICD-10-CM | POA: Diagnosis not present

## 2019-08-03 DIAGNOSIS — E1152 Type 2 diabetes mellitus with diabetic peripheral angiopathy with gangrene: Secondary | ICD-10-CM | POA: Diagnosis not present

## 2019-08-03 DIAGNOSIS — I70261 Atherosclerosis of native arteries of extremities with gangrene, right leg: Secondary | ICD-10-CM | POA: Diagnosis not present

## 2019-08-03 DIAGNOSIS — Z4801 Encounter for change or removal of surgical wound dressing: Secondary | ICD-10-CM | POA: Diagnosis not present

## 2019-08-03 DIAGNOSIS — I130 Hypertensive heart and chronic kidney disease with heart failure and stage 1 through stage 4 chronic kidney disease, or unspecified chronic kidney disease: Secondary | ICD-10-CM | POA: Diagnosis not present

## 2019-08-03 DIAGNOSIS — Z48812 Encounter for surgical aftercare following surgery on the circulatory system: Secondary | ICD-10-CM | POA: Diagnosis not present

## 2019-08-04 ENCOUNTER — Telehealth: Payer: Self-pay | Admitting: Hematology

## 2019-08-04 NOTE — Telephone Encounter (Signed)
Scheduled per 03/31 los, called patient and spoke with patient's wife regarding patient's upcoming appointments.

## 2019-08-08 DIAGNOSIS — E291 Testicular hypofunction: Secondary | ICD-10-CM | POA: Diagnosis not present

## 2019-08-09 ENCOUNTER — Inpatient Hospital Stay: Payer: Medicare Other | Attending: Hematology | Admitting: Hematology

## 2019-08-09 ENCOUNTER — Other Ambulatory Visit: Payer: Self-pay | Admitting: Hematology

## 2019-08-09 DIAGNOSIS — I82411 Acute embolism and thrombosis of right femoral vein: Secondary | ICD-10-CM

## 2019-08-09 DIAGNOSIS — I749 Embolism and thrombosis of unspecified artery: Secondary | ICD-10-CM | POA: Diagnosis not present

## 2019-08-09 NOTE — Progress Notes (Signed)
HEMATOLOGY/ONCOLOGY CONSULTATION NOTE  Date of Service: 08/09/2019  Patient Care Team: [name], MD as PCP - General (Family Medicine) O'Neal, Cassie Freer, MD as PCP - Cardiology (Internal Medicine)  REFERRING PHYSICIAN: [name], MD  CHIEF COMPLAINTS/PURPOSE OF CONSULTATION:  DVT   HISTORY OF PRESENTING ILLNESS:   Jonathon [name] is a wonderful 69 y.o. male who has been referred to [place] by [name] MD  for evaluation and management of DVT. Pt is accompanied today by Mrs. Jonathon [name]. The pt reports that he is doing well overall.   The pt reports he is good. Pt has had both doses of the COVID19 vaccine. He was having pain in the right leg. 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 At the beginning of February he saw that the right big toe was bright red and could see the redness was moving up the leg. Pt had an US of the leg and they found a clot. He has been placed on Xarelto. Pt has never had clots related to surgeries. He is still on a testosterone shots for about 6 months.   He is up to date with his age appropriate cancer screenings. About three years ago the pt developed a shortening or tightening of achilles tendon and started going to PT, but they have gotten better. Pt also had an adrenalectomy years ago. Previously he had been physically active. Pt had gout one time a while ago but is still taking gout medication.   Most recent lab results (07/18/29) of CMP is as follows: all values are WNL except for CO2 at 21, Glucose at 125, Creatinine at 1.29, Calcium at 7.6, GFR calc non Af Amer at 56 07/18/19 of CBC is as follows: all values are WNL except for Platelets at 115K,  07/19/19 of Glucose-Capillary at 148  On review of systems, pt denies irregular bowl movement, bleeding concerns, unexpected weight loss and any other symptoms.   On PMHx the pt reports atrial fibrillation, Right great toe amputation, adrenalectomy    On Social Hx the pt reports smoking  (quit in 1979)   On Family Hx the pt reports daughter has a hyper coagulative disorder (PAI-1 4g-5g Polymorphism), father had stroke   INTERVAL HISTORY:   I connected with  [name] on 08/09/19 by telephone and verified that I am speaking with the correct person using two identifiers.   I discussed the limitations of evaluation and management by telemedicine. The patient expressed understanding and agreed to proceed.  Other persons participating in the visit and their role in the encounter:       -Yevette Edwards, Medical Scribe      -[name], Pt's wife  Patient's location: Home Provider's location: [name] [place] is a wonderful 69 y.o. male who is here for evaluation and management of DVT. The patient's last visit with [place] was on 07/27/2019. The pt reports that he is doing well overall.  The pt reports that he has been well and has had no new symptoms.   Lab results (07/27/19) is as follows: dRVVT Mix at 72.9 dRVVT Confirm at 1.9 Prothrombin gene mutation is "Negative" Factor 5 leiden mutation study is "Negative"  Lupus anticoagulant panel -concerning  For presence of lupus anticoagulant Cardiolipin antibodies, IgG, IgM, IgA is as follows: Anticardiolipin IgG at <9, Anticardiolipin IgM at <9, Anticardiolipin IgA at <9 Beta-2-glycoprotein i abs, IgG/M/A is as follows: Beta-2 Glyco I IgG at <9, Beta-2-Glycoprotein I IgM at <9, Beta-2-Glycoprotein I  IgA at <9   On review of systems, pt denies any other symptoms.   MEDICAL HISTORY:  Past Medical History:  Diagnosis Date  . Anemia due to blood loss, acute 2014   2nd GIB  . Anxiety   . CKD (chronic kidney disease), stage III 2014  . Diabetes mellitus without complication ([place])   . Gout   . High cholesterol   . Hypertension 2014  . Hypothyroid   . Neuropathy   . PUD (peptic ulcer disease) 2014     SURGICAL HISTORY: Past Surgical History:  Procedure Laterality Date  . ABDOMINAL  AORTOGRAM W/LOWER EXTREMITY Bilateral 06/27/2019   Procedure: ABDOMINAL AORTOGRAM W/LOWER EXTREMITY;  Surgeon: Elam Dutch, MD;  Location: [facility];  Service: Vascular;  Laterality: Bilateral;  . ADRENALECTOMY    . AMPUTATION Right 07/18/2019   Procedure: AMPUTATION RIGHT GREAT TOE;  Surgeon: [name], MD;  Location: [facility];  Service: Vascular;  Laterality: Right;  . EMBOLECTOMY Right 06/27/2019   Procedure: RIGHT LEG EMBOLECTOMY;  Surgeon: Elam Dutch, MD;  Location[facility];  Service: Vascular;  Laterality: Right;     SOCIAL HISTORY: Social History   Socioeconomic History  . Marital status: Married    Spouse name: Not on file  . Number of children: Not on file  . Years of education: Not on file  . Highest education level: Not on file  Occupational History  . Not on file  Tobacco Use  . Smoking status: Former Research scientist (life sciences)  . Smokeless tobacco: Never Used  Substance and Sexual Activity  . Alcohol use: Yes    Comment: daily  . Drug use: Never  . Sexual activity: Not on file  Other Topics Concern  . Not on file  Social History Narrative  . Not on file   Social Determinants of Health   Financial Resource Strain:   . Difficulty of Paying Living Expenses:   Food Insecurity:   . Worried About Charity fundraiser in the Last Year:   . Arboriculturist in the Last Year:   Transportation Needs:   . Film/video editor (Medical):   [name] Lack of Transportation (Non-Medical):   Physical Activity:   . Days of Exercise per Week:   . Minutes of Exercise per Session:   Stress:   . Feeling of Stress :   Social Connections:   . Frequency of Communication with Friends and Family:   . Frequency of Social Gatherings with Friends and Family:   . Attends Religious Services:   . Active Member of Clubs or Organizations:   . Attends Archivist Meetings:   [name] Marital Status:   Intimate Partner Violence:   . Fear of Current or Ex-Partner:   . Emotionally  Abused:   [name] Physically Abused:   . Sexually Abused:      FAMILY HISTORY: Family History  Problem Relation Age of Onset  . Diabetes Mellitus II Maternal Aunt      ALLERGIES:   is allergic to morphine and related.   MEDICATIONS:  Current Outpatient Medications  Medication Sig Dispense Refill  . ALPRAZolam (XANAX) 0.25 MG tablet Take 0.25 mg by mouth at bedtime.     [name] aspirin EC 81 MG EC tablet Take 1 tablet (81 mg total) by mouth daily.    [name] atorvastatin (LIPITOR) 40 MG tablet Take 40 mg by mouth at bedtime.     . colchicine 0.6 MG tablet Take 0.6 mg by mouth  at bedtime.     [name] doxazosin (CARDURA) 4 MG tablet Take 4 mg by mouth at bedtime.     . febuxostat (ULORIC) 40 MG tablet Take 40 mg by mouth at bedtime.     . fenofibrate 54 MG tablet Take 1 tablet (54 mg total) by mouth daily. 30 tablet 2  . JARDIANCE 10 MG TABS tablet Take 10 mg by mouth at bedtime.     [name] levothyroxine (SYNTHROID) 112 MCG tablet Take 112 mcg by mouth at bedtime.     [name] losartan (COZAAR) 25 MG tablet TAKE 1/2 TABLET(12.5 MG) BY MOUTH DAILY (Patient taking differently: Take 12.5 mg by mouth daily. ) 45 tablet 3  . metFORMIN (GLUCOPHAGE-XR) 500 MG 24 hr tablet Take 1,000 mg by mouth at bedtime.     . metoprolol succinate (TOPROL-XL) 100 MG 24 hr tablet TAKE 1 TABLET(100 MG) BY MOUTH DAILY WITH OR IMMEDIATELY FOLLOWING A MEAL (Patient taking differently: Take 100 mg by mouth daily. ) 90 tablet 3  . oxybutynin (DITROPAN-XL) 10 MG 24 hr tablet Take 10 mg by mouth at bedtime.     [name] oxyCODONE (OXY IR/ROXICODONE) 5 MG immediate release tablet Take 1 tablet (5 mg total) by mouth every 6 (six) hours as needed for moderate pain. (Patient not taking: Reported on 07/27/2019) 20 tablet 0  . pantoprazole (PROTONIX) 40 MG tablet Take 40 mg by mouth 2 (two) times daily.     [name] PARoxetine (PAXIL) 40 MG tablet Take 40 mg by mouth at bedtime.     . pregabalin (LYRICA) 75 MG capsule Take 150 mg by mouth at bedtime.     . rivaroxaban  (XARELTO) 20 MG TABS tablet Take 1 tablet (20 mg total) by mouth daily with supper. 30 tablet 6  . temazepam (RESTORIL) 15 MG capsule Take 15 mg by mouth at bedtime.     [name] testosterone cypionate (DEPOTESTOTERONE CYPIONATE) 100 MG/ML injection Inject 200 mg into the muscle every 14 (fourteen) days.      No current facility-administered medications for this visit.     REVIEW OF SYSTEMS:   A 10+ POINT REVIEW OF SYSTEMS WAS OBTAINED including neurology, dermatology, psychiatry, cardiac, respiratory, lymph, extremities, GI, GU, Musculoskeletal, constitutional, breasts, reproductive, HEENT.  All pertinent positives are noted in the HPI.  All others are negative.   PHYSICAL EXAMINATION: ECOG PERFORMANCE STATUS: 1 - Symptomatic but completely ambulatory  Telehealth visit  LABORATORY DATA:  I have reviewed the data as listed  CBC Latest Ref Rng & Units 07/18/2019 07/17/2019 07/16/2019  WBC 4.0 - 10.5 K/uL 4.9 6.5 5.7  Hemoglobin 13.0 - 17.0 g/dL 13.8 13.8 15.2  Hematocrit 39.0 - 52.0 % 40.7 41.5 47.0  Platelets 150 - 400 K/uL 115(L) PLATELET CLUMPS NOTED ON SMEAR, UNABLE TO ESTIMATE 134(L)    CMP Latest Ref Rng & Units 07/19/2019 07/18/2019 07/17/2019  Glucose 70 - 99 mg/dL 125(H) 91 85  BUN 8 - 23 mg/dL 16 12 16   Creatinine 0.61 - 1.24 mg/dL 1.29(H) 1.22 1.51(H)  Sodium 135 - 145 mmol/L 137 139 137  Potassium 3.5 - 5.1 mmol/L 4.2 3.0(L) 3.3(L)  Chloride 98 - 111 mmol/L 107 105 104  CO2 22 - 32 mmol/L 21(L) 22 20(L)  Calcium 8.9 - 10.3 mg/dL 7.6(L) 7.9(L) 8.3(L)  Total Protein 6.5 - 8.1 g/dL - - -  Total Bilirubin 0.3 - 1.2 mg/dL - - -  Alkaline Phos 38 - 126 U/L - - -  AST 15 - 41 U/L - - -  ALT 0 - 44 U/L - - -    RADIOGRAPHIC STUDIES: I have personally reviewed the radiological images as listed and agreed with the findings in the report. DG Chest Port 1 View  Result Date: 07/15/2019 CLINICAL DATA:  History of open wound. EXAM: PORTABLE CHEST 1 VIEW COMPARISON:  Chest x-ray  08/05/2017. FINDINGS: Mediastinum hilar structures normal. Lungs are clear. No pleural effusion or pneumothorax. Heart size stable. No acute bony abnormality identified. IMPRESSION: 1.  Cardiomegaly.  No pulmonary venous congestion. 2.  No acute pulmonary disease. Electronically Signed   By: Marcello Moores  Register   On: 07/15/2019 12:32   DG Toe Great Right  Result Date: 07/15/2019 CLINICAL DATA:  Open wound EXAM: RIGHT FIRST TOE: 3 V COMPARISON:  Right foot June 08, 2019 FINDINGS: Frontal, oblique, and lateral views were obtained. Soft tissue air is noted along the lateral aspect of the first digit. There is no erosive change or bony destruction appreciable. No fracture or dislocation. There is slight narrowing of the first MTP joint. Other joint spaces appear normal. IMPRESSION: Soft tissue air along the lateral aspect of the first digit. Mild osteoarthritic change first MTP joint. No bony destruction. No fracture or dislocation. Electronically Signed   By: Lowella Grip III M.D.   On: 07/15/2019 12:34    [place] ext venous :06/08/2019 IMPRESSION: Nonocclusive thrombus in the distal right femoral vein. The remainder of the deep venous system appears is patent without thrombus.  Electronically Signed: By: Audie Pinto M.D. On: 06/08/2019 19:52   ASSESSMENT & PLAN:    Myreon Wimer is a 69 y.o. male with:  1. 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 Rt superficial femoral artery occlusion -- likely embolic clot from atrial fibrillation. S/p embolectomy.  PLAN: -Discussed pt labwork, 07/27/19; Prothrombin gene and Factor 5 leiden mutations are "Negative", Cardiolipin antibodies, IgG, IgM, IgA are all WNL, Beta-2-glycoprotein i abs, IgG/M/A are all WNL, dRVVT Mix at 72.9, dRVVT Confirm at 1.9, PTT Lupus Anticoagulant at 51.0, DRVVT at 136.2.  -Lupus  anticoagulant is elevated, but pt currently on Xarelto. Will need to be retested off of Xarelto.  -Advised pt that there were different causes for his two blood clots. The clot in his right femoral artery was likely embolic from a fib and the clot in his right femoral vein was likely due to his testosterone replacement.  -Recommend pt discontinue hormone therapy. Recommend pt discuss Testosterone with PCP. -Patient will need ongoing anticoagulation with Xarelto for his atrial fibrillation irrespective of whether the DVT requires long term anticoagulation. -Recommend pt repeat Lupus Anticoagulant  with PCP in 3-4 months (off of Xarelto for 48h) to confirm finding -Recommend pt stay up to date with age appropriate cancer screenings with PCP -Continue 20 mg Xarelto daily -Will see back as needed      FOLLOW UP RTC with Dr. Irene Limbo as needed Follow-up with PCP   The total time spent in the appt was 20 minutes and more than 50% was on counseling and direct patient cares.  All of the patient's questions were answered with apparent satisfaction. The patient knows to call the clinic with any problems, questions or concerns.    Sullivan Lone MD [place] AAHIVMS [facility] Hematology/Oncology Physician [facility]  (Office):       [phone] (Work cell):  [phone] (  Fax):           [phone]  08/09/2019 11:10 AM  I, Yevette Edwards, am acting as a scribe for Dr. Sullivan Lone.   .I have reviewed the above documentation for accuracy and completeness, and I agree with the above. Brunetta Genera MD

## 2019-08-10 ENCOUNTER — Telehealth (HOSPITAL_COMMUNITY): Payer: Self-pay

## 2019-08-10 NOTE — Telephone Encounter (Signed)

## 2019-08-11 ENCOUNTER — Encounter: Payer: Medicare Other | Admitting: Vascular Surgery

## 2019-08-11 ENCOUNTER — Other Ambulatory Visit: Payer: Self-pay

## 2019-08-11 ENCOUNTER — Ambulatory Visit (INDEPENDENT_AMBULATORY_CARE_PROVIDER_SITE_OTHER): Payer: Self-pay | Admitting: Vascular Surgery

## 2019-08-11 ENCOUNTER — Encounter: Payer: Self-pay | Admitting: Vascular Surgery

## 2019-08-11 VITALS — BP 120/79 | HR 73 | Temp 97.3°F | Resp 20 | Ht 72.0 in | Wt 237.0 lb

## 2019-08-11 DIAGNOSIS — I743 Embolism and thrombosis of arteries of the lower extremities: Secondary | ICD-10-CM

## 2019-08-11 NOTE — Progress Notes (Signed)
Patient is a 69 year old male who returns for follow-up today.  He recently had a right leg embolectomy for presumably A. fib embolus.  This was on June 27, 2019.  He subsequently required right first toe amputation.  He returns today for further follow-up.  He has no pain in the toe amputation site.  He has no drainage.  He has no numbness or tingling in the left foot.  He is on Xarelto.  Physical exam:  Vitals:   08/11/19 1542  BP: 120/79  Pulse: 73  Resp: 20  Temp: (!) 97.3 F (36.3 C)  SpO2: 100%  Weight: 237 lb (107.5 kg)  Height: 6' (1.829 m)    2+ dorsalis pedis pulses bilaterally  Well-healed right first toe amputation site sutures removed today  Assessment: Doing well status post right leg embolectomy and right first toe amputation.  Plan: The patient was given a prescription today for shoe insert for the right foot.  He will follow-up with [place] on as-needed basis.  [name], MD [facility] Office: [phone]

## 2019-08-12 DIAGNOSIS — Z4801 Encounter for change or removal of surgical wound dressing: Secondary | ICD-10-CM | POA: Diagnosis not present

## 2019-08-12 DIAGNOSIS — I130 Hypertensive heart and chronic kidney disease with heart failure and stage 1 through stage 4 chronic kidney disease, or unspecified chronic kidney disease: Secondary | ICD-10-CM | POA: Diagnosis not present

## 2019-08-12 DIAGNOSIS — Z48812 Encounter for surgical aftercare following surgery on the circulatory system: Secondary | ICD-10-CM | POA: Diagnosis not present

## 2019-08-12 DIAGNOSIS — Z4781 Encounter for orthopedic aftercare following surgical amputation: Secondary | ICD-10-CM | POA: Diagnosis not present

## 2019-08-12 DIAGNOSIS — I70261 Atherosclerosis of native arteries of extremities with gangrene, right leg: Secondary | ICD-10-CM | POA: Diagnosis not present

## 2019-08-12 DIAGNOSIS — E1152 Type 2 diabetes mellitus with diabetic peripheral angiopathy with gangrene: Secondary | ICD-10-CM | POA: Diagnosis not present

## 2019-08-16 DIAGNOSIS — N400 Enlarged prostate without lower urinary tract symptoms: Secondary | ICD-10-CM | POA: Diagnosis not present

## 2019-08-16 DIAGNOSIS — R3121 Asymptomatic microscopic hematuria: Secondary | ICD-10-CM | POA: Diagnosis not present

## 2019-08-17 DIAGNOSIS — Z4781 Encounter for orthopedic aftercare following surgical amputation: Secondary | ICD-10-CM | POA: Diagnosis not present

## 2019-08-17 DIAGNOSIS — Z4801 Encounter for change or removal of surgical wound dressing: Secondary | ICD-10-CM | POA: Diagnosis not present

## 2019-08-17 DIAGNOSIS — Z48812 Encounter for surgical aftercare following surgery on the circulatory system: Secondary | ICD-10-CM | POA: Diagnosis not present

## 2019-08-17 DIAGNOSIS — I130 Hypertensive heart and chronic kidney disease with heart failure and stage 1 through stage 4 chronic kidney disease, or unspecified chronic kidney disease: Secondary | ICD-10-CM | POA: Diagnosis not present

## 2019-08-17 DIAGNOSIS — E1152 Type 2 diabetes mellitus with diabetic peripheral angiopathy with gangrene: Secondary | ICD-10-CM | POA: Diagnosis not present

## 2019-08-17 DIAGNOSIS — I70261 Atherosclerosis of native arteries of extremities with gangrene, right leg: Secondary | ICD-10-CM | POA: Diagnosis not present

## 2019-08-19 DIAGNOSIS — R3121 Asymptomatic microscopic hematuria: Secondary | ICD-10-CM | POA: Diagnosis not present

## 2019-08-19 DIAGNOSIS — N281 Cyst of kidney, acquired: Secondary | ICD-10-CM | POA: Diagnosis not present

## 2019-08-19 DIAGNOSIS — K8020 Calculus of gallbladder without cholecystitis without obstruction: Secondary | ICD-10-CM | POA: Diagnosis not present

## 2019-08-22 DIAGNOSIS — E291 Testicular hypofunction: Secondary | ICD-10-CM | POA: Diagnosis not present

## 2019-08-30 DIAGNOSIS — N281 Cyst of kidney, acquired: Secondary | ICD-10-CM | POA: Diagnosis not present

## 2019-08-30 DIAGNOSIS — R3121 Asymptomatic microscopic hematuria: Secondary | ICD-10-CM | POA: Diagnosis not present

## 2019-08-30 DIAGNOSIS — N400 Enlarged prostate without lower urinary tract symptoms: Secondary | ICD-10-CM | POA: Diagnosis not present

## 2019-09-05 ENCOUNTER — Other Ambulatory Visit: Payer: Self-pay | Admitting: Internal Medicine

## 2019-09-05 ENCOUNTER — Encounter: Payer: Self-pay | Admitting: Internal Medicine

## 2019-09-05 ENCOUNTER — Ambulatory Visit (INDEPENDENT_AMBULATORY_CARE_PROVIDER_SITE_OTHER): Payer: Medicare Other | Admitting: Internal Medicine

## 2019-09-05 VITALS — BP 108/71 | HR 76 | Temp 98.2°F | Ht 72.0 in | Wt 230.1 lb

## 2019-09-05 DIAGNOSIS — E291 Testicular hypofunction: Secondary | ICD-10-CM | POA: Diagnosis not present

## 2019-09-05 DIAGNOSIS — E1129 Type 2 diabetes mellitus with other diabetic kidney complication: Secondary | ICD-10-CM | POA: Diagnosis not present

## 2019-09-05 DIAGNOSIS — Z7984 Long term (current) use of oral hypoglycemic drugs: Secondary | ICD-10-CM

## 2019-09-05 DIAGNOSIS — I482 Chronic atrial fibrillation, unspecified: Secondary | ICD-10-CM

## 2019-09-05 DIAGNOSIS — I10 Essential (primary) hypertension: Secondary | ICD-10-CM

## 2019-09-05 DIAGNOSIS — R152 Fecal urgency: Secondary | ICD-10-CM

## 2019-09-05 DIAGNOSIS — R159 Full incontinence of feces: Secondary | ICD-10-CM | POA: Diagnosis not present

## 2019-09-05 DIAGNOSIS — R32 Unspecified urinary incontinence: Secondary | ICD-10-CM

## 2019-09-05 LAB — GLUCOSE, CAPILLARY: Glucose-Capillary: 216 mg/dL — ABNORMAL HIGH (ref 70–99)

## 2019-09-05 LAB — POCT GLYCOSYLATED HEMOGLOBIN (HGB A1C): Hemoglobin A1C: 5.1 % (ref 4.0–5.6)

## 2019-09-05 MED ORDER — OXYBUTYNIN CHLORIDE ER 10 MG PO TB24: 10.0000 mg | ORAL_TABLET | Freq: Every day | ORAL | 0 refills | Status: DC

## 2019-09-05 MED ORDER — METOPROLOL SUCCINATE ER 50 MG PO TB24: 50.0000 mg | ORAL_TABLET | Freq: Every day | ORAL | 0 refills | Status: DC

## 2019-09-05 NOTE — Assessment & Plan Note (Signed)
Patient A1c during this visit was 5.1.  Patient is currently being prescribed Jardiance 10 mg nightly, Metformin 1000 mg daily.   We will discontinue Metformin as patient's A1c is very well controlled and he has been having fecal incontinence.  We will continue Jardiance 10 mg daily.

## 2019-09-05 NOTE — Assessment & Plan Note (Signed)
Patient has been having fecal incontinence for the past 3 to 4 months.  He states that he knows that he needs to go to the restroom and he cannot get to the restroom fast enough.  He states that he ends up soiling in his pants approximately once per week.  He is needed to wear depends due to this.  He does not have any weakness in his lower extremity, anal analgesia, saddle analgesia, changes in his gait.  Patient states that he is had 1 fall due to dizziness in March 2021.  Patient states that his stool has been soft but he also has formed stools.  No severe diarrhea.  No back pain.   Assessment and plan Patient does not have any psychogenic symptoms to contribute to fecal incontinence.  Will discontinue patient's Metformin as this can contribute to fecal incontinence.  Patient has diabetes which can cause autonomic neuropathy leading to fecal incontinence.  Will refer to GI for further evaluation.  We will also refer to pelvic physical therapy to assist with strengthening the muscles in the anal region.

## 2019-09-05 NOTE — Patient Instructions (Addendum)
It was a pleasure to see you today Jonathon [name]. Please make the following changes:  -stop metformin  -decrease metoprolol from 100mg  daily to 50mg  daily  -you have been referred to Gastroenterology and pelvic physical therapy  If you have any questions or concerns, please call our clinic at [phone] between 9am-5pm and after hours call [phone] and ask for the internal medicine resident on call. If you feel you are having a medical emergency please call 911.   Thank you, we look forward to help you remain healthy!  Lars Mage, MD Internal Medicine PGY3    Fecal Incontinence Fecal incontinence, also called accidental bowel leakage, is not being able to control your bowels. This condition happens because the nerves or muscles around the anus do not work the way they should. This affects their ability to hold stool (feces). What are the causes? This condition may be caused by:  Damage to the muscles at the end of the rectum (sphincter).  Damage to the nerves that control bowel movements.  Diarrhea.  Chronic constipation.  Pelvic floor dysfunction. This means the muscles in the pelvis do not work well.  Loss of bowel storage capacity. This occurs when the rectum can no longer stretch in size in order to store feces.  Inflammatory bowel disease (IBD), such as Crohn's disease.  Irritable bowel syndrome (IBS). What increases the risk? You are more likely to develop this condition if you:  Were born with bowels or a pelvis that did not form correctly.  Have had rectal surgery.  Have had radiation treatment for certain cancers.  Have been pregnant, had a vaginal delivery, or had surgery that damaged the pelvic floor muscles.  Had a complicated childbirth, spinal cord injury, or other trauma that caused nerve damage.  Have a condition that can affect nerve function, such as diabetes, Parkinson's disease, or multiple sclerosis.  Have a condition where the rectum  drops down into the anus or vagina (prolapse).  Are 27 years of age or older. What are the signs or symptoms? The main symptom of this condition is not being able to control your bowels. You also might not be able to get to the bathroom before a bowel movement. How is this diagnosed? This condition is diagnosed with a medical history and physical exam. You may also have other tests, including:  Blood tests.  Urine tests.  A rectal exam.  Ultrasound.  MRI.  Colonoscopy. This is an exam that looks at your large intestine (colon).  Anal manometry. This is a test that measures the strength of the anal sphincter.  Anal electromyogram (EMG). This is a test that uses small electrodes to check for nerve damage. How is this treated? Treatment for this condition depends on the cause and severity. Treatment may also focus on addressing any underlying causes of this condition. Treatment may include:  Medicines. This may include medicines to: ? Prevent diarrhea. ? Help with constipation (bulk-forming laxatives). ? Treat any underlying conditions.  Biofeedback therapy. This can help to retrain muscles that are affected.  Fiber supplements. These can help manage your bowel movements.  Nerve stimulation.  Injectable gel to promote tissue growth and better muscle control.  Surgery. You may need: ? Sphincter repair surgery. ? Diversion surgery. This procedure lets feces pass out of your body through a hole in your abdomen. Follow these instructions at home: Eating and drinking   Follow instructions from your health care provider about any eating or drinking restrictions. ? Work with  a dietitian to come up with a healthy diet that will help you avoid the foods that can make your condition worse. ? Keep a diet diary to find out which foods or drinks could be making your condition worse.  Drink enough fluid to keep your urine pale yellow. Lifestyle  Do not use any products that  contain nicotine or tobacco, such as cigarettes and e-cigarettes. If you need help quitting, ask your health care provider. This may help your condition.  If you are overweight, talk with your health care provider about how to safely lose weight. This may help your condition.  Increase your physical activity as told by your health care provider. This may help your condition. Always talk with your health care provider before starting a new exercise program.  Carry a change of clothes and supplies to clean up quickly if you have an episode of fetal incontinence.  Consider joining a fecal incontinence support group. You can find a support group online or in your local community. General instructions   Take over-the-counter and prescription medicines only as told by your health care provider. This includes any supplements.  Apply a moisture barrier, such as petroleum jelly, to your rectum. This protects the skin from irritation caused by ongoing leaking or diarrhea.  Tell your health care provider if you are upset or depressed about your condition.  Keep all follow-up visits as told by your health care provider. This is important. Where to find more information  International Foundation for Functional Gastrointestinal Disorders: iffgd.[place] of Gastroenterology: patients.gi.org Contact a health care provider if:  You have a fever.  You have redness, swelling, or pain around your rectum.  Your pain is getting worse or you lose feeling in your rectal area.  You have blood in your stool.  You feel sad or hopeless.  You avoid social or work situations. Get help right away if:  You stop having bowel movements.  You cannot eat or drink without vomiting.  You have rectal bleeding that does not stop.  You have severe pain that is getting worse.  You have symptoms of dehydration, including: ? Sleepiness or fatigue. ? Producing little or no urine, tears, or  sweat. ? Dizziness. ? Dry mouth. ? Unusual irritability. ? Headache. ? Inability to think clearly. Summary  Fecal incontinence, also called accidental bowel leakage, is not being able to control your bowels. This condition happens because the nerves or muscles around the anus do not work the way they should.  Treatment varies depending on the cause and severity of your condition. Treatment may also focus on addressing any underlying causes of this condition.  Follow instructions from your health care provider about any eating or drinking restrictions, lifestyle changes, and skin care.  Take over-the-counter and prescription medicines only as told by your health care provider. This includes any supplements.  Tell your health care provider if your symptoms worsen or if you are upset or depressed about your condition. This information is not intended to replace advice given to you by your health care provider. Make sure you discuss any questions you have with your health care provider. Document Revised: 08/27/2017 Document Reviewed: 08/27/2017 Elsevier Patient Education  [employer].

## 2019-09-05 NOTE — Progress Notes (Signed)
   CC: Diabetes Mellitus follow up  HPI:  [name] is a 69 y.o. with hypertension, hypothyroid, CKD 3, diabetes mellitus who presents to establish care and for diabetes follow-up. Please see problem based charting for evaluation, assessment, and plan.    Past Medical History:  Diagnosis Date  . Anemia due to blood loss, acute 2014   2nd GIB  . Anxiety   . CKD (chronic kidney disease), stage III 2014  . Diabetes mellitus without complication ([place])   . Gout   . High cholesterol   . Hypertension 2014  . Hypothyroid   . Neuropathy   . PUD (peptic ulcer disease) 2014   Review of Systems:   Per HPI.  Physical Exam:  Vitals:   09/05/19 1419  BP: 108/71  Pulse: 76  Temp: 98.2 F (36.8 C)  TempSrc: Oral  SpO2: 98%  Weight: 230 lb 1.6 oz (104.4 kg)  Height: 6' (1.829 m)   Physical Exam  Constitutional: He appears well-developed and well-nourished. No distress.  HENT:  Head: Normocephalic and atraumatic.  Eyes: Conjunctivae are normal.  Cardiovascular: Normal rate, regular rhythm and normal heart sounds.  Respiratory: Effort normal and breath sounds normal. No respiratory distress. He has no wheezes.  GI: Soft. Bowel sounds are normal. He exhibits no distension. There is no abdominal tenderness.  Genitourinary:    Genitourinary Comments: No decreased sensation around the anus or any saddle area.  Anal wink is present.  Feces noted around external surface of buttocks.   Neurological: He is alert.  Skin: He is not diaphoretic.  Psychiatric: He has a normal mood and affect. His behavior is normal. Judgment and thought content normal.    Assessment & Plan:   See Encounters Tab for problem based charting.  Patient discussed with Dr. Dareen Piano

## 2019-09-05 NOTE — Assessment & Plan Note (Signed)
Patient's blood pressure during this visit was 108/71.  He has been having soft blood pressures during the past few visits as well.  He states that he has been having occasional dizziness and a recent fall secondary to this.  Assessment and plan Will decrease patient's metoprolol from 100 mg daily to 50 mg daily.  Follow-up in 4 weeks.

## 2019-09-05 NOTE — Assessment & Plan Note (Signed)
Patient states that he has been having urinary urgency for the past 2 to 3 months.  He was recently taken off of oxybutynin during his March 2021 appointment.  Apparently during that visit the patient stated to his doctor that he did not have any urinary incontinence and therefore decision was made to stop the medication.  Assessment and plan We will restart the patient's oxybutynin 10 mg daily.

## 2019-09-06 NOTE — Progress Notes (Signed)
Internal Medicine Clinic Attending  Case discussed with Dr. Chundi at the time of the visit.  We reviewed the resident's history and exam and pertinent patient test results.  I agree with the assessment, diagnosis, and plan of care documented in the resident's note. 

## 2019-09-08 ENCOUNTER — Encounter: Payer: Self-pay | Admitting: Gastroenterology

## 2019-09-19 DIAGNOSIS — E291 Testicular hypofunction: Secondary | ICD-10-CM | POA: Diagnosis not present

## 2019-10-06 ENCOUNTER — Other Ambulatory Visit: Payer: Self-pay

## 2019-10-06 ENCOUNTER — Ambulatory Visit (INDEPENDENT_AMBULATORY_CARE_PROVIDER_SITE_OTHER): Payer: Medicare Other | Admitting: Internal Medicine

## 2019-10-06 DIAGNOSIS — I10 Essential (primary) hypertension: Secondary | ICD-10-CM

## 2019-10-06 DIAGNOSIS — R159 Full incontinence of feces: Secondary | ICD-10-CM

## 2019-10-06 DIAGNOSIS — E1129 Type 2 diabetes mellitus with other diabetic kidney complication: Secondary | ICD-10-CM

## 2019-10-06 NOTE — Progress Notes (Signed)
Established Patient Office Visit  Subjective:  Patient ID: Jonathon [name], male    DOB: [birth year]  Age: 69 y.o. MRN: [id number]  CC: Establish care  HPI [name] presents for male with PMHx as documented below, presented to establish care. Today we address fecal incontinency and diabetes. Please refer to problem based charting for further details and assessment and plan of current problem and chronic medical conditions.   Past Medical History:  Diagnosis Date  . Anemia due to blood loss, acute 2014   2nd GIB  . Anxiety   . CKD (chronic kidney disease), stage III 2014  . Diabetes mellitus without complication ([place])   . Gout   . High cholesterol   . Hypertension 2014  . Hypothyroid   . Neuropathy   . PUD (peptic ulcer disease) 2014    Past Surgical History:  Procedure Laterality Date  . ABDOMINAL AORTOGRAM W/LOWER EXTREMITY Bilateral 06/27/2019   Procedure: ABDOMINAL AORTOGRAM W/LOWER EXTREMITY;  Surgeon: Elam Dutch, MD;  Location: [facility];  Service: Vascular;  Laterality: Bilateral;  . ADRENALECTOMY    . AMPUTATION Right 07/18/2019   Procedure: AMPUTATION RIGHT GREAT TOE;  Surgeon: [name], MD;  Location: ;  Service: Vascular;  Laterality: Right;  . EMBOLECTOMY Right 06/27/2019   Procedure: RIGHT LEG EMBOLECTOMY;  Surgeon: Elam Dutch, MD;  Location: [facility] OR;  Service: Vascular;  Laterality: Right;    Family History  Problem Relation Age of Onset  . Diabetes Mellitus II Maternal Aunt   . Lung cancer Mother   . Pancreatic cancer Sister     Social History   Socioeconomic History  . Marital status: Married    Spouse name: Not on file  . Number of children: Not on file  . Years of education: Not on file  . Highest education level: Not on file  Occupational History  . Occupation: banking    Comment: retired  Tobacco Use  . Smoking status: Former Smoker    Packs/day: 2.00    Years: 3.00    Pack years: 6.00     Types: Cigarettes  . Smokeless tobacco: Former Systems developer    Quit date: 05/08/1967  Vaping Use  . Vaping Use: Never used  Substance and Sexual Activity  . Alcohol use: Yes    Alcohol/week: 2.0 standard drinks    Types: 2 Shots of liquor per week    Comment: daily  . Drug use: Yes    Types: Marijuana  . Sexual activity: Not on file  Other Topics Concern  . Not on file  Social History Narrative  . Not on file   Social Determinants of Health   Financial Resource Strain:   . Difficulty of Paying Living Expenses:   Food Insecurity:   . Worried About Charity fundraiser in the Last Year:   . Arboriculturist in the Last Year:   Transportation Needs:   . Film/video editor (Medical):   [name] Lack of Transportation (Non-Medical):   Physical Activity:   . Days of Exercise per Week:   . Minutes of Exercise per Session:   Stress:   . Feeling of Stress :   Social Connections:   . Frequency of Communication with Friends and Family:   . Frequency of Social Gatherings with Friends and Family:   . Attends Religious Services:   . Active Member of Clubs or Organizations:   . Attends Archivist Meetings:   [name] Marital Status:  Intimate Partner Violence:   . Fear of Current or Ex-Partner:   . Emotionally Abused:   [name] Physically Abused:   . Sexually Abused:     Outpatient Medications Prior to Visit  Medication Sig Dispense Refill  . ALPRAZolam (XANAX) 0.25 MG tablet Take 0.25 mg by mouth at bedtime.     [name] aspirin EC 81 MG EC tablet Take 1 tablet (81 mg total) by mouth daily.    [name] atorvastatin (LIPITOR) 40 MG tablet Take 40 mg by mouth at bedtime.     . colchicine 0.6 MG tablet Take 0.6 mg by mouth at bedtime.     [name] doxazosin (CARDURA) 4 MG tablet Take 4 mg by mouth at bedtime.     . febuxostat (ULORIC) 40 MG tablet Take 40 mg by mouth at bedtime.     . fenofibrate 54 MG tablet Take 1 tablet (54 mg total) by mouth daily. 30 tablet 2  . JARDIANCE 10 MG TABS tablet Take 10 mg by mouth  at bedtime.     [name] levothyroxine (SYNTHROID) 112 MCG tablet Take 112 mcg by mouth at bedtime.     [name] losartan (COZAAR) 25 MG tablet TAKE 1/2 TABLET(12.5 MG) BY MOUTH DAILY (Patient taking differently: Take 12.5 mg by mouth daily. ) 45 tablet 3  . metoprolol succinate (TOPROL-XL) 50 MG 24 hr tablet TAKE 1 TABLET(50 MG) BY MOUTH DAILY WITH OR IMMEDIATELY FOLLOWING A MEAL 90 tablet 1  . oxybutynin (DITROPAN-XL) 10 MG 24 hr tablet TAKE 1 TABLET(10 MG) BY MOUTH AT BEDTIME 90 tablet 1  . oxyCODONE (OXY IR/ROXICODONE) 5 MG immediate release tablet Take 1 tablet (5 mg total) by mouth every 6 (six) hours as needed for moderate pain. 20 tablet 0  . pantoprazole (PROTONIX) 40 MG tablet Take 40 mg by mouth 2 (two) times daily.     [name] PARoxetine (PAXIL) 40 MG tablet Take 40 mg by mouth at bedtime.     . pregabalin (LYRICA) 75 MG capsule Take 150 mg by mouth at bedtime.     . rivaroxaban (XARELTO) 20 MG TABS tablet Take 1 tablet (20 mg total) by mouth daily with supper. 30 tablet 6  . temazepam (RESTORIL) 15 MG capsule Take 15 mg by mouth at bedtime.     [name] testosterone cypionate (DEPOTESTOTERONE CYPIONATE) 100 MG/ML injection Inject 200 mg into the muscle every 14 (fourteen) days.      No facility-administered medications prior to visit.    Allergies  Allergen Reactions  . Morphine And Related Itching    ROS Review of Systems    Objective:    Physical Exam  BP (!) 142/81 (BP Location: Right Arm, Patient Position: Sitting, Cuff Size: Small)   Pulse (!) 52   Temp 97.6 F (36.4 C) (Oral)   Ht 6' (1.829 m)   Wt 230 lb 3.2 oz (104.4 kg)   SpO2 98%   BMI 31.22 kg/m  Wt Readings from Last 3 Encounters:  10/06/19 230 lb 3.2 oz (104.4 kg)  09/05/19 230 lb 1.6 oz (104.4 kg)  08/11/19 237 lb (107.5 kg)     Health Maintenance Due  Topic Date Due  . Hepatitis C Screening  Never done  . FOOT EXAM  Never done  . OPHTHALMOLOGY EXAM  Never done  . COVID-19 Vaccine (1) Never done  . COLONOSCOPY   Never done  . PNA vac Low Risk Adult (1 of 2 - PCV13) Never done    There are no preventive care reminders  to display for this patient.  Lab Results  Component Value Date   TSH 0.969 07/15/2019   Lab Results  Component Value Date   WBC 4.9 07/18/2019   HGB 13.8 07/18/2019   HCT 40.7 07/18/2019   MCV 93.3 07/18/2019   PLT 115 (L) 07/18/2019   Lab Results  Component Value Date   NA 137 07/19/2019   K 4.2 07/19/2019   CO2 21 (L) 07/19/2019   GLUCOSE 125 (H) 07/19/2019   BUN 16 07/19/2019   CREATININE 1.29 (H) 07/19/2019   BILITOT 1.0 07/15/2019   ALKPHOS 61 07/15/2019   AST 54 (H) 07/15/2019   ALT 34 07/15/2019   PROT 6.5 07/15/2019   ALBUMIN 2.9 (L) 07/15/2019   CALCIUM 7.6 (L) 07/19/2019   ANIONGAP 9 07/19/2019   No results found for: CHOL No results found for: HDL No results found for: LDLCALC No results found for: TRIG No results found for: CHOLHDL Lab Results  Component Value Date   HGBA1C 5.1 09/05/2019      Assessment & Plan:   Problem List Items Addressed This Visit    None      No orders of the defined types were placed in this encounter.   Follow-up: No follow-ups on file.    Dewayne Hatch, MD

## 2019-10-06 NOTE — Patient Instructions (Addendum)
Thank you for allowing [place] to provide your care today. Today we discussed your diabetes, your hypertension, blood clots and tremor.  Today we did not make any changes to your medications.  Please take them as before  Please follow-up in clinic in 3 months for blood work. Someone from GI office will call you for an appointment. Please follow-up with physical therapy.   Should you have any questions or concerns please call the internal medicine clinic at [phone].    As always, if having severe symptoms, please seek medical attention at emergency room.

## 2019-10-07 ENCOUNTER — Encounter: Payer: Self-pay | Admitting: Internal Medicine

## 2019-10-07 NOTE — Assessment & Plan Note (Addendum)
Metoprolol dose decreased last visit due to soft/low normal BP and dizziness. No more dizziness. BP today is 142/81 with pulse rate of 52.  -Continue Metoprolol 50 mg QD.  -If he will need more BP control next visits, would prefer to add another antihypertensive meds rather than increasing Metoprolol dose given low heart rate.

## 2019-10-07 NOTE — Assessment & Plan Note (Signed)
On Jardiance 10 mg daily. Metformin stopped last visit due to having A1c of 5.1.  -Continue Jardiance -Check hemoglobin A1c in 2 months.

## 2019-10-07 NOTE — Assessment & Plan Note (Signed)
This has been a chronic problem. Thought to be attributed to diabetes autonomic neuropathy vs Metformin. He think that his symptom been the same since stopping Metformin.  He states that it is not a constant problem and only happens sometime. When occurs, he have small amount of feces before make it to bathroom. Per evaluatipon on prior visit:  Anal wink was present and de did not have decreased sensation at the peri-anal area.  Has not got the GI appointment and PT yet that ordered last visit. We made sure that the referral is in. Informed patient that the plan is to DM control, start pelvic strengthening PT and have him being seen by GI. He is also due for colonoscopy. Informed him that will be contacted by the offices for the appointments.

## 2019-10-10 NOTE — Progress Notes (Signed)
Internal Medicine Clinic Attending  Case discussed with Dr. Masoudi  at the time of the visit.  We reviewed the resident's history and exam and pertinent patient test results.  I agree with the assessment, diagnosis, and plan of care documented in the resident's note.  

## 2019-10-17 ENCOUNTER — Other Ambulatory Visit: Payer: Self-pay | Admitting: Internal Medicine

## 2019-10-18 ENCOUNTER — Ambulatory Visit: Payer: Medicare Other | Admitting: Gastroenterology

## 2019-10-18 NOTE — Addendum Note (Signed)
Addended by: Hulan Fray on: 10/18/2019 08:26 PM   Modules accepted: Orders

## 2019-10-28 ENCOUNTER — Other Ambulatory Visit: Payer: Self-pay

## 2019-10-28 MED ORDER — TEMAZEPAM 15 MG PO CAPS: 15.0000 mg | ORAL_CAPSULE | Freq: Every day | ORAL | 5 refills | Status: DC

## 2019-10-28 NOTE — Telephone Encounter (Signed)
temazepam (RESTORIL) 15 MG capsule, REFILL REQUEST @  [id number] - [place], [place] - [address] AT [place] Phone:  [phone]  Fax:  [phone]

## 2019-11-04 ENCOUNTER — Other Ambulatory Visit: Payer: Self-pay | Admitting: Internal Medicine

## 2019-11-30 ENCOUNTER — Telehealth: Payer: Self-pay

## 2019-11-30 ENCOUNTER — Encounter: Payer: Self-pay | Admitting: *Deleted

## 2019-11-30 NOTE — Telephone Encounter (Signed)
Open error 

## 2019-12-15 ENCOUNTER — Encounter: Payer: Medicare Other | Admitting: Internal Medicine

## 2019-12-15 NOTE — Progress Notes (Deleted)
   CC: ***  HPI:  Mr.Sathvik Kloss is a 69 y.o.   PMHx: Afib, CHF, HTN, PAD, DM2, CKD, gout, urinary and fecal incontinance.  PMHx: ASA, Alprazolem, lipitor, colchicine, febuxostate 40 mg QD QD, fenofibrate, Jardiance, Levothyroxin, Losartan, Metoprolol, Oxycodone, Paroxetine, Xarelto 20 ,Temazepam,   Past Medical History:  Diagnosis Date  . Anemia due to blood loss, acute 2014   2nd GIB  . Anxiety   . CKD (chronic kidney disease), stage III 2014  . Diabetes mellitus without complication ([place])   . Gout   . High cholesterol   . Hypertension 2014  . Hypothyroid   . Neuropathy   . PUD (peptic ulcer disease) 2014   Review of Systems:  ***  Physical Exam:  There were no vitals filed for this visit. ***  Assessment & Plan:   See Encounters Tab for problem based charting.  Patient discussed with Dr. {NAMES:[id number]::"Butcher","Guilloud","Hoffman","Mullen","Narendra","Raines","Vincent"}

## 2019-12-16 ENCOUNTER — Other Ambulatory Visit: Payer: Self-pay

## 2019-12-16 ENCOUNTER — Encounter: Payer: Self-pay | Admitting: Internal Medicine

## 2019-12-16 ENCOUNTER — Ambulatory Visit (INDEPENDENT_AMBULATORY_CARE_PROVIDER_SITE_OTHER): Payer: Medicare Other | Admitting: Internal Medicine

## 2019-12-16 VITALS — BP 123/83 | HR 99 | Temp 98.5°F | Ht 72.0 in | Wt 230.0 lb

## 2019-12-16 DIAGNOSIS — Z86718 Personal history of other venous thrombosis and embolism: Secondary | ICD-10-CM

## 2019-12-16 DIAGNOSIS — I4891 Unspecified atrial fibrillation: Secondary | ICD-10-CM

## 2019-12-16 DIAGNOSIS — M109 Gout, unspecified: Secondary | ICD-10-CM

## 2019-12-16 DIAGNOSIS — R251 Tremor, unspecified: Secondary | ICD-10-CM | POA: Diagnosis not present

## 2019-12-16 DIAGNOSIS — I10 Essential (primary) hypertension: Secondary | ICD-10-CM | POA: Diagnosis not present

## 2019-12-16 DIAGNOSIS — I509 Heart failure, unspecified: Secondary | ICD-10-CM

## 2019-12-16 DIAGNOSIS — I70209 Unspecified atherosclerosis of native arteries of extremities, unspecified extremity: Secondary | ICD-10-CM | POA: Insufficient documentation

## 2019-12-16 DIAGNOSIS — N529 Male erectile dysfunction, unspecified: Secondary | ICD-10-CM | POA: Diagnosis not present

## 2019-12-16 DIAGNOSIS — I739 Peripheral vascular disease, unspecified: Secondary | ICD-10-CM | POA: Diagnosis not present

## 2019-12-16 DIAGNOSIS — Z7901 Long term (current) use of anticoagulants: Secondary | ICD-10-CM

## 2019-12-16 DIAGNOSIS — I824Z1 Acute embolism and thrombosis of unspecified deep veins of right distal lower extremity: Secondary | ICD-10-CM

## 2019-12-16 MED ORDER — SILDENAFIL CITRATE 25 MG PO TABS: 25.0000 mg | ORAL_TABLET | ORAL | 1 refills | Status: DC | PRN

## 2019-12-16 NOTE — Progress Notes (Signed)
   CC: Tremor, Asking for Viagra  HPI:  [name] is a 69 y.o. male with PMHx as documented below, presented with tremor and erectile dysfucntion. Both problem has been chronic but he would like to discuss them today. Please refer to problem based charting for further details and assessment and plan of current problem and chronic medical conditions.  PMHx: DVT, Afib, CHF, HTN, PAD, DM2, CKD, gout, Hx of urinary and fecal incontinence, Rt superficial artery clot  PMHx: ASA, Alprazolem, lipitor, colchicine, febuxostate 40 mg  QD, fenofibrate, Jardiance, Levothyroxin, Losartan, Metoprolol, Oxycodone, Paroxetine, Xarelto 20 ,Temazepam.  Past Medical History:  Diagnosis Date  . Anemia [name] to blood loss, acute 2014   2nd GIB  . Anxiety   . CKD (chronic kidney disease), stage III 2014  . Diabetes mellitus without complication ([place])   . Gout   . High cholesterol   . Hypertension 2014  . Hypothyroid   . Neuropathy   . PUD (peptic ulcer disease) 2014   Review of Systems:   Constitutional: Negative for chills and fever.  Respiratory: Negative for shortness of breath.   Cardiovascular: Negative for chest pain and leg swelling.  Neurological: Positive for tremor, negative for balance issue GU: Positive for erectile dysfuntion  Physical Exam:   Vitals:   12/16/19 1350  BP: 123/83  Pulse: 99  Temp: 98.5 F (36.9 C)  TempSrc: Oral  SpO2: 99%  Weight: 230 lb (104.3 kg)  Height: 6' (1.829 m)   Constitutional: Well-developed and well-nourished. No acute distress.  Cardiovascular: irregular rhythm, nl S1S2, no murmur,  no LEE Respiratory: Effort normal and breath sounds normal. No respiratory distress. No wheezes.  GI: No distension.  Neurological: Is alert and oriented x 3, mild action tremor. Gait is intact. No sensory or motor deficit of rt upper extremities MSK: Amputated rt toe Skin: Scar of prior rt leg surgery is present Psychiatric:  Normal mood and affect. Behavior  is normal. Judgment and thought content normal.   Assessment & Plan:   See Encounters Tab for problem based charting.  Patient discussed with Dr. Heber [place]

## 2019-12-16 NOTE — Patient Instructions (Addendum)
I prescribe Viagra for you.  Please take it only as needed. If you developed dizziness or lightheadedness, stop taking that and come to the clinic for evaluation.  Please make an appointment for next week to discuss your mediations and for probable blood work. Bring all of your medications with you next time.   For your shaking hands, my physical exam did not show any concerning finding. If any new symptoms, please let us know.   Sildenafil tablets (Erectile Dysfunction) What is this medicine? SILDENAFIL (sil DEN a fil) is used to treat erection problems in men. This medicine may be used for other purposes; ask your health care provider or pharmacist if you have questions. COMMON BRAND NAME(S): Viagra What should I tell my health care provider before I take this medicine? They need to know if you have any of these conditions:  bleeding disorders  eye or vision problems, including a rare inherited eye disease called retinitis pigmentosa  anatomical deformation of the penis, Peyronie's disease, or history of priapism (painful and prolonged erection)  heart disease, angina, a history of heart attack, irregular heart beats, or other heart problems  high or low blood pressure  history of blood diseases, like sickle cell anemia or leukemia  history of stomach bleeding  kidney disease  liver disease  stroke  an unusual or allergic reaction to sildenafil, other medicines, foods, dyes, or preservatives  pregnant or trying to get pregnant  breast-feeding How should I use this medicine? Take this medicine by mouth with a glass of water. Follow the directions on the prescription label. The dose is usually taken 1 hour before sexual activity. You should not take the dose more than once per day. Do not take your medicine more often than directed. Talk to your pediatrician regarding the use of this medicine in children. This medicine is not used in children for this  condition. Overdosage: If you think you have taken too much of this medicine contact a poison control center or emergency room at once. NOTE: This medicine is only for you. Do not share this medicine with others. What if I miss a dose? This does not apply. Do not take double or extra doses. What may interact with this medicine? Do not take this medicine with any of the following medications:  cisapride  nitrates like amyl nitrite, isosorbide dinitrate, isosorbide mononitrate, nitroglycerin  riociguat This medicine may also interact with the following medications:  antiviral medicines for HIV or AIDS  bosentan  certain medicines for benign prostatic hyperplasia (BPH)  certain medicines for blood pressure  certain medicines for fungal infections like ketoconazole and itraconazole  cimetidine  erythromycin  rifampin This list may not describe all possible interactions. Give your health care provider a list of all the medicines, herbs, non-prescription drugs, or dietary supplements you use. Also tell them if you smoke, drink alcohol, or use illegal drugs. Some items may interact with your medicine. What should I watch for while using this medicine? If you notice any changes in your vision while taking this drug, call your doctor or health care professional as soon as possible. Stop using this medicine and call your health care provider right away if you have a loss of sight in one or both eyes. Contact your doctor or health care professional right away if you have an erection that lasts longer than 4 hours or if it becomes painful. This may be a sign of a serious problem and must be treated right away to  prevent permanent damage. If you experience symptoms of nausea, dizziness, chest pain or arm pain upon initiation of sexual activity after taking this medicine, you should refrain from further activity and call your doctor or health care professional as soon as possible. Do not drink  alcohol to excess (examples, 5 glasses of wine or 5 shots of whiskey) when taking this medicine. When taken in excess, alcohol can increase your chances of getting a headache or getting dizzy, increasing your heart rate or lowering your blood pressure. Using this medicine does not protect you or your partner against HIV infection (the virus that causes AIDS) or other sexually transmitted diseases. What side effects may I notice from receiving this medicine? Side effects that you should report to your doctor or health care professional as soon as possible:  allergic reactions like skin rash, itching or hives, swelling of the face, lips, or tongue  breathing problems  changes in hearing  changes in vision  chest pain  fast, irregular heartbeat  prolonged or painful erection  seizures Side effects that usually do not require medical attention (report to your doctor or health care professional if they continue or are bothersome):  back pain  dizziness  flushing  headache  indigestion  muscle aches  nausea  stuffy or runny nose This list may not describe all possible side effects. Call your doctor for medical advice about side effects. You may report side effects to FDA at 1-800-FDA-1088. Where should I keep my medicine? Keep out of reach of children. Store at room temperature between 15 and 30 degrees C (59 and 86 degrees F). Throw away any unused medicine after the expiration date. NOTE: This sheet is a summary. It may not cover all possible information. If you have questions about this medicine, talk to your doctor, pharmacist, or health care provider.  2020 Elsevier/Gold Standard (2015-03-28 12:00:25)

## 2019-12-16 NOTE — Assessment & Plan Note (Signed)
Patient denies any recent gout flare and mentions that he thinks he does not have gout anymore.  Does not know if he is on any treatment for gout.  However, his medications list, he is on febuxostate 40 mg.  I will ask him to bring all of his medications with him next visit. (Preferably come with his wife next time, so I can review the plan with her)

## 2019-12-17 ENCOUNTER — Other Ambulatory Visit: Payer: Self-pay | Admitting: Internal Medicine

## 2019-12-18 ENCOUNTER — Encounter: Payer: Self-pay | Admitting: Internal Medicine

## 2019-12-18 DIAGNOSIS — R251 Tremor, unspecified: Secondary | ICD-10-CM | POA: Insufficient documentation

## 2019-12-18 NOTE — Assessment & Plan Note (Signed)
BP today was well controled at 123/83 HR 88. Previously, metoprolol dose decreased due to soft low blood pressure and dizziness.  -Continued metoprolol 50 mg daily.

## 2019-12-18 NOTE — Assessment & Plan Note (Signed)
Patient reports erectile dysfunction and asks for Viagra.  He endorses having this problem for several years. Morning erection is impaired as well. He has a good sexual desire but can not achieve a full erection and it does not last long enough.   Can be vasculogenic. Informed patient about hypotensive effect of Viagra and my concern about that effect, given he had Hx of orthostatic hypotension before. He still would like to try that. He is not on Nitrate.  We reviewed all precautions and symptoms that with those, he should stop this medicine.  We may consider adjusting some of his medications (such as switching Doxazosine to Topomax for less hypotensive effect) next visit.  -Viagra 25 mg PRN -Instructed to be cautious about hypotension with Viagra and stop it developed dizziness, lightheadedness,.... He verbalizes understanding.

## 2019-12-18 NOTE — Assessment & Plan Note (Signed)
This is the first visit discussing this problem. Patient reports few months Hx of tremor of both hands. It occurs sometimes and when he try to grab stuff or carry a cup of coffee. No new gait issue or other new neurologic complaint.  Neurologic exam is unremarkable. No significant gait abnormality or evidence of parkinson disease such as muscle rigidity on exam.   Mild intermittent tremor: Likely essential tremor. Will monitor for now. Will consider Tx if worsens.

## 2019-12-18 NOTE — Assessment & Plan Note (Addendum)
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 S/p embolectomy. Patient has been on anticoagulation with Xarelto. (Patient will need ongoing anticoagulation with Xarelto for his atrial fibrillation irrespective of whether the DVT requires long term anticoagulation). Prior work up by hematologist, showed elevated Lupus anticoagulation but pt was on Xarelto that time. Recommended to repeat that test again in 3-4 months. He should be off of Xarelto for 2 days before obtaining the test.   I asked him to come to clinic with his wife next time and bring all the medications, to explaine the instruction. He will follow up in clinic in 1-2 weeks. -Continue Xarelto -Next visit, will hold Xarelto for 48 h and then recheck lupus anticoagulant

## 2019-12-19 ENCOUNTER — Telehealth: Payer: Self-pay | Admitting: *Deleted

## 2019-12-19 DIAGNOSIS — N529 Male erectile dysfunction, unspecified: Secondary | ICD-10-CM

## 2019-12-19 NOTE — Telephone Encounter (Signed)
Call to [place] information was given for PA for Sildenafil.  Information was given to representative.  Awaiting determination.  Sander Nephew, RN 12/19/2019 3:15 PM.

## 2019-12-20 NOTE — Telephone Encounter (Signed)
Viagra PA denied.  Per pharmacy rx can be changed to sildenafil (revatio) 20mg  tabs which is may not be covered, but more affordable out of pocket.  Pt's wife also requesting refill on pt's xanax.  Will send request for new sildenafil rx and xanax refill to the appropriate team..kg

## 2019-12-20 NOTE — Progress Notes (Signed)
Internal Medicine Clinic Attending  Case discussed with Dr. Masoudi  At the time of the visit.  We reviewed the resident's history and exam and pertinent patient test results.  I agree with the assessment, diagnosis, and plan of care documented in the resident's note.  

## 2019-12-21 NOTE — Telephone Encounter (Signed)
Attempted to call patient x2. Left voicemail with callback number.

## 2019-12-21 NOTE — Telephone Encounter (Signed)
Pt's wife rtn.  Please call back.

## 2019-12-22 MED ORDER — SILDENAFIL CITRATE 25 MG PO TABS: 25.0000 mg | ORAL_TABLET | Freq: Every day | ORAL | 1 refills | Status: DC | PRN

## 2019-12-22 NOTE — Progress Notes (Signed)
Spoke with patient and wife over the phone.   For the sildenafil prescription, I discussed a GoodRx coupon which should make the price feasible. They agree to try this. Provided instructions to find the coupon online, and also printed off 2 copies and left them at the front desk for them to pick up at their upcoming office visit on 8/30.   Also discussed the requested Xanax refill. We have not evaluated him yet for this issue. This was last prescribed by his prior PCP. Discussed that we need to evaluate this issue at our upcoming office visit first. They express understanding.

## 2019-12-26 ENCOUNTER — Other Ambulatory Visit: Payer: Self-pay

## 2019-12-26 ENCOUNTER — Encounter: Payer: Self-pay | Admitting: Internal Medicine

## 2019-12-26 ENCOUNTER — Ambulatory Visit (INDEPENDENT_AMBULATORY_CARE_PROVIDER_SITE_OTHER): Payer: Medicare Other | Admitting: Internal Medicine

## 2019-12-26 VITALS — BP 105/68 | HR 72 | Temp 98.3°F | Ht 72.0 in | Wt 226.5 lb

## 2019-12-26 DIAGNOSIS — N529 Male erectile dysfunction, unspecified: Secondary | ICD-10-CM | POA: Diagnosis not present

## 2019-12-26 DIAGNOSIS — I824Z1 Acute embolism and thrombosis of unspecified deep veins of right distal lower extremity: Secondary | ICD-10-CM

## 2019-12-26 DIAGNOSIS — Z9889 Other specified postprocedural states: Secondary | ICD-10-CM | POA: Diagnosis not present

## 2019-12-26 DIAGNOSIS — M109 Gout, unspecified: Secondary | ICD-10-CM | POA: Diagnosis not present

## 2019-12-26 DIAGNOSIS — Z86718 Personal history of other venous thrombosis and embolism: Secondary | ICD-10-CM | POA: Diagnosis not present

## 2019-12-26 DIAGNOSIS — R32 Unspecified urinary incontinence: Secondary | ICD-10-CM

## 2019-12-26 DIAGNOSIS — I70209 Unspecified atherosclerosis of native arteries of extremities, unspecified extremity: Secondary | ICD-10-CM

## 2019-12-26 DIAGNOSIS — F411 Generalized anxiety disorder: Secondary | ICD-10-CM | POA: Diagnosis not present

## 2019-12-26 MED ORDER — ALPRAZOLAM 0.25 MG PO TABS: 0.2500 mg | ORAL_TABLET | ORAL | 0 refills | Status: DC

## 2019-12-26 MED ORDER — TAMSULOSIN HCL 0.4 MG PO CAPS: 0.4000 mg | ORAL_CAPSULE | Freq: Every day | ORAL | 3 refills | Status: DC

## 2019-12-26 NOTE — Patient Instructions (Addendum)
Thank you for allowing [place] to provide your care today. Please pay attention to following plans that we discussed today:  1- Do not take Xarelto for the 48 hours (Tuesday and Wednesday night) 2- Come to our lab on Thursday morning for blood work 3- [place] on thursday night (the night of 12/29/2019) and continue taking that every day as before 4- We gave you Good RX coupon for Viagra. 5- To prevent low blood pressure while on Viagra, we do an adjustment to your medications: Stop Doxazocin and take Tomsulosine instead. Check your blood pressure twice a day and give [place] a call if it was lower than 100/50 or if you had any dizziness or lightheadedness while taking Viagra. 6- Stop Colchicine 7- Take Alprazolam three times a week only. 8- Take rest of your medications as before 9- Come to the lab on Thursday 12/29/2019.  10 -Come back to clinic in 3 months or sooner if needed  As always, if having severe symptoms, please seek medical attention at emergency room. Should you have any questions or concerns please call the internal medicine clinic at [phone].    Thank you!

## 2019-12-26 NOTE — Assessment & Plan Note (Signed)
Patient about his medication today.  He is on Febuxostat 40 mg daily and colchicine 0.6 mg daily. No uric acid in chart.  Patient and his wife mentions that he has not had any gout attack for several years.  -Stopping colchicine -Checking uric acid. If significantly low, will stop Feboxostat. If around the goal, will discuss options with patient (either hold uric lowering agent and monitor uric acid, vs continuing that)

## 2019-12-26 NOTE — Assessment & Plan Note (Addendum)
Patient asking for refill on alprazolam.  He was prescribed as 0.25 mg 3 times daily as needed.  He mentions that he takes it every night.  He took the last dose 3 nights ago before he ran out of it.  Patient and his wife are not sure about the reason the patient is on alprazolam.  He mentions that it is started by his prior PCP many years ago.  Patient has a good sleep. Denies anxiety.  He mentions that he did not have any issue in the past 2 nights while he was off of alprazolam.   He also takes temazepam 15 mg every night.  We may be able to gradually taper down the alprazolam. To prevent withdrawal, will taper down to 3 times a week.   -Decrease alprazolam to 0.25 mg 3 times a week and if tolerates it may try to discontinue gradually  They mentioned that

## 2019-12-26 NOTE — Progress Notes (Signed)
   CC: F/u of gout, f/u of DVT and arterial clot work up  HPI:  [name] is a 69 y.o. male with PMHx as documented below, presented for follow up of gout, medications review, f/u of DVT and arterial clot work up. Please refer to problem based charting for further details and assessment and plan of current problem and chronic medical conditions.  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  Past Medical History:  Diagnosis Date  . Anemia due to blood loss, acute 2014   2nd GIB  . Anxiety   . CKD (chronic kidney disease), stage III 2014  . Diabetes mellitus without complication ([place])   . Gout   . High cholesterol   . Hypertension 2014  . Hypothyroid   . Neuropathy   . PUD (peptic ulcer disease) 2014   Review of Systems:   Constitutional: Negative for chills and fever.  Respiratory: Negative for shortness of breath.   Cardiovascular: Negative for chest pain and leg swelling.  Neurologic: Negative for dizziness or lighthededness  Physical Exam:   Vitals:   12/26/19 1458  BP: 105/68  Pulse: 72  Temp: 98.3 F (36.8 C)  TempSrc: Oral  SpO2: 98%  Weight: 226 lb 8 oz (102.7 kg)  Height: 6' (1.829 m)   Constitutional: Well-developed and well-nourished. No acute distress. Cardiovascular: irregular rhythm, nl S1S2, no murmur,  no LEE Respiratory: Effort normal and breath sounds normal. No respiratory distress. No wheezes.  GI: No distension.  Neurological: Is alert and oriented x 3 Skin: Surgical scar at rt leg Psychiatric: Normal mood and affect. Behavior is normal. Judgment and thought content normal.   Assessment & Plan:   See Encounters Tab for problem based charting.  Patient discussed with  Dr. Heber [place]

## 2019-12-26 NOTE — Assessment & Plan Note (Addendum)
Viagra prescribed last visit.  Patient has not received that yet due to cost and asking for good Rx. His blood pressure today is 105/68.   -Good Rx coupon provided -To prevent hypotension while on Viagra, will switch doxazosin to tamsulosin (with less systemic and hypotensive effect) -Precautions and information regarding probable side effect provided to patient and wife. They will monitor his blood pressure at home while on Viagra.

## 2019-12-26 NOTE — Assessment & Plan Note (Signed)
Per last hematologist recommendation, will hold Xarelto for 48 h and recheck Lupus anticoagulation test. 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  Prior work-up showed abnormal lupus anticoagulation test.  However he was on Xarelto when the blood test obtained.  Therefore, recommended to repeat blood work in 3 months after holding Xarelto for 48 hours.)  Patient's wife is here with him today and I explained the plan and provided the instruction. Patient and his wife verbalize understanding.  -Hold Xarelto for 48 hours -Lab only appointment in 3 days for recheck lupus anticoagulation test. -Resume Xarelto after obtaining blood work

## 2019-12-28 NOTE — Progress Notes (Signed)
Internal Medicine Clinic Attending  Case discussed with Dr. Masoudi  At the time of the visit.  We reviewed the resident's history and exam and pertinent patient test results.  I agree with the assessment, diagnosis, and plan of care documented in the resident's note.  

## 2019-12-29 ENCOUNTER — Other Ambulatory Visit: Payer: Self-pay

## 2019-12-29 ENCOUNTER — Other Ambulatory Visit (INDEPENDENT_AMBULATORY_CARE_PROVIDER_SITE_OTHER): Payer: Medicare Other

## 2019-12-29 DIAGNOSIS — M109 Gout, unspecified: Secondary | ICD-10-CM | POA: Diagnosis not present

## 2019-12-29 DIAGNOSIS — I824Z1 Acute embolism and thrombosis of unspecified deep veins of right distal lower extremity: Secondary | ICD-10-CM | POA: Diagnosis not present

## 2019-12-30 ENCOUNTER — Telehealth: Payer: Self-pay | Admitting: *Deleted

## 2019-12-30 LAB — URIC ACID: Uric Acid: 1.9 mg/dL — ABNORMAL LOW (ref 3.8–8.4)

## 2019-12-30 NOTE — Telephone Encounter (Signed)
Wife would like to know if Tamsulosin is for blood pressure or enlarged prostate.

## 2019-12-31 LAB — LUPUS ANTICOAGULANT PANEL
Dilute Viper Venom Time: 53.1 s — ABNORMAL HIGH (ref 0.0–47.0)
PTT Lupus Anticoagulant: 38.7 s (ref 0.0–51.9)

## 2019-12-31 LAB — DRVVT CONFIRM: dRVVT Confirm: 1.2 ratio (ref 0.8–1.2)

## 2019-12-31 LAB — DRVVT MIX: dRVVT Mix: 43.1 s — ABNORMAL HIGH (ref 0.0–40.4)

## 2020-01-03 ENCOUNTER — Other Ambulatory Visit: Payer: Self-pay | Admitting: Internal Medicine

## 2020-01-03 NOTE — Progress Notes (Signed)
I called patient and talked to his wife about lab result and the plan:  1-Discussed lupus anticoagulant result which was nl. I sent a message to Dr. Irene Limbo to see if pt needs more f/u. 2-His uric acid is low at 1.9. He did not have gout attack for a while. Will discontinue Febuxostat. 3-Per pr wife, he has not been on Alprazolam for 2 weeks and he is doing OK w/o that. She asks if she has to fill the priscription. (Our plan was taper down and DC Alprazolam if tolerates). I agree to DC Alprazolam. 4-We switched Doxazosin to Flomax last visit when started him on Viagra to decrease risk of hypotension. Apparently pt did not have Hx of BPH and was on Doxazosin only for HTN. Discussed this with wife. Decided to DC flomax and follow up in clinic as scheduled for BP check.   Dewayne Hatch, MD [id number] 01/03/2020, 5:03 PM Pager: [phone]

## 2020-01-12 ENCOUNTER — Ambulatory Visit (INDEPENDENT_AMBULATORY_CARE_PROVIDER_SITE_OTHER): Payer: Medicare Other | Admitting: Internal Medicine

## 2020-01-12 ENCOUNTER — Encounter: Payer: Self-pay | Admitting: Internal Medicine

## 2020-01-12 ENCOUNTER — Other Ambulatory Visit: Payer: Self-pay

## 2020-01-12 VITALS — BP 134/77 | HR 73 | Wt 223.9 lb

## 2020-01-12 DIAGNOSIS — L03115 Cellulitis of right lower limb: Secondary | ICD-10-CM | POA: Diagnosis not present

## 2020-01-12 DIAGNOSIS — Z23 Encounter for immunization: Secondary | ICD-10-CM

## 2020-01-12 MED ORDER — CEPHALEXIN 500 MG PO CAPS: 500.0000 mg | ORAL_CAPSULE | Freq: Four times a day (QID) | ORAL | 0 refills | Status: AC

## 2020-01-12 NOTE — Patient Instructions (Signed)
You most likely have something called cellulitis which is what is causing the wound on your toe. I have sent a presecription to your pharmacy for an antibiotic called keflex. It will be for a total of 10 days. I would like you to come back in one week so we can recheck your toe.  If your toe begins to drain any abnormal colored fluid, gets worse or you develop fever or chills, please return sooner. I have also placed referrals to podiatry and wound care.

## 2020-01-13 ENCOUNTER — Encounter: Payer: Self-pay | Admitting: Internal Medicine

## 2020-01-13 NOTE — Progress Notes (Signed)
Office Visit   Patient ID: Jonathon [name], male    DOB: [birth year], 69 y.o.   MRN: [id number]  Subjective:  CC: right foot wound  HPI 69 y.o. presents today for evaluation of 2w history of unhealing right foot wound. Located on the distal aspect of the right 3rd toe. No known truamatic event. Patient denies fevers, chills or other systemic symptoms. Denies any purulent drainage and notes that it just bleeds a little when he is in the shower. It is not painful. 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  He does have T2DM that is well controlled and denies any significant abnormal blood sugars.      ACTIVE MEDICATIONS   Current Outpatient Medications on File Prior to Visit  Medication Sig Dispense Refill  . aspirin EC 81 MG EC tablet Take 1 tablet (81 mg total) by mouth daily.    [name] atorvastatin (LIPITOR) 40 MG tablet Take 40 mg by mouth at bedtime.     . fenofibrate 54 MG tablet TAKE 1 TABLET(54 MG) BY MOUTH DAILY 90 tablet 1  . JARDIANCE 10 MG TABS tablet TAKE 1 TABLET BY MOUTH EVERY DAY 90 tablet 1  . levothyroxine (SYNTHROID) 112 MCG tablet Take 112 mcg by mouth at bedtime.     [name] losartan (COZAAR) 25 MG tablet TAKE 1/2 TABLET(12.5 MG) BY MOUTH DAILY (Patient taking differently: Take 12.5 mg by mouth daily. ) 45 tablet 3  . metoprolol succinate (TOPROL-XL) 50 MG 24 hr tablet TAKE 1 TABLET(50 MG) BY MOUTH DAILY WITH OR IMMEDIATELY FOLLOWING A MEAL 90 tablet 1  . oxybutynin (DITROPAN-XL) 10 MG 24 hr tablet TAKE 1 TABLET(10 MG) BY MOUTH AT BEDTIME 90 tablet 1  . oxyCODONE (OXY IR/ROXICODONE) 5 MG immediate release tablet Take 1 tablet (5 mg total) by mouth every 6 (six) hours as needed for moderate pain. 20 tablet 0  . pantoprazole (PROTONIX) 40 MG tablet TAKE 1 TABLET BY MOUTH TWICE DAILY 180 tablet 1  . PARoxetine (PAXIL) 40 MG tablet Take 40 mg by mouth at bedtime.     . pregabalin (LYRICA) 75 MG  capsule Take 150 mg by mouth at bedtime.     . rivaroxaban (XARELTO) 20 MG TABS tablet Take 1 tablet (20 mg total) by mouth daily with supper. 30 tablet 6  . sildenafil (VIAGRA) 25 MG tablet Take 1 tablet (25 mg total) by mouth daily as needed for erectile dysfunction. 20 tablet 1  . temazepam (RESTORIL) 15 MG capsule Take 1 capsule (15 mg total) by mouth at bedtime. 30 capsule 5  . testosterone cypionate (DEPOTESTOTERONE CYPIONATE) 100 MG/ML injection Inject 200 mg into the muscle every 14 (fourteen) days.      No current facility-administered medications on file prior to visit.    ROS  Review of Systems  Constitutional: Negative for chills and fever.  Musculoskeletal: Negative for arthralgias and joint swelling.  Skin: Positive for wound. Negative for rash.  Neurological: Negative.     Objective:   BP 134/77 (BP Location: Left Arm, Patient Position: Sitting, Cuff Size: Normal)   Pulse 73   Wt 223 lb 14.4 oz (101.6 kg)   SpO2 99%   BMI 30.37 kg/m  Wt Readings from Last 3 Encounters:  01/12/20 223 lb 14.4 oz (101.6 kg)  12/26/19 226 lb 8 oz (102.7 kg)  12/16/19 230 lb (104.3 kg)   BP Readings from Last 3 Encounters:  01/12/20 134/77  12/26/19 105/68  12/16/19 123/83   Physical Exam Constitutional:      General: He is not in acute distress.    Appearance: Normal appearance. He is not toxic-appearing.  Cardiovascular:     Rate and Rhythm: Normal rate and regular rhythm.     Pulses:          Dorsalis pedis pulses are 3+ on the right side and 3+ on the left side.       Posterior tibial pulses are 3+ on the right side and 3+ on the left side.  Pulmonary:     Effort: Pulmonary effort is normal.     Breath sounds: Normal breath sounds.  Musculoskeletal:        General: No swelling.     Right lower leg: No edema.     Left lower leg: No edema.  Skin:    Capillary Refill: Capillary refill takes less than 2 seconds.     Comments: Approximate 0.5cm circumfrencial ulcer on the  distal aspect of the right 3rd toe. Minimal serosanguinous drainage. Mild swelling and erythema of right third toe. Erythema does not extend into the foot, no streaking.   Neurological:     Mental Status: He is alert.     Comments: No sensation to pinprick of the right foot     Health Maintenance:   Health Maintenance  Topic Date Due  . Hepatitis C Screening  Never done  . OPHTHALMOLOGY EXAM  Never done  . COVID-19 Vaccine (1) Never done  . COLONOSCOPY  Never done  . PNA vac Low Risk Adult (1 of 2 - PCV13) Never done  . HEMOGLOBIN A1C  03/07/2020  . FOOT EXAM  12/15/2020  . TETANUS/TDAP  08/02/2023  . INFLUENZA VACCINE  Completed     Assessment & Plan:   Problem List Items Addressed This Visit      Musculoskeletal and Integument   Cellulitis of right foot - Primary    Patient with well controlled T2DM presenting with 2w history of nonhealing ulceration of the distal aspect of the right 3rd toe.  He has been afebrile. It is mildly erythematous and edematous on exam but does not track into the foot. There is minimal drainage that is serosanguinous. There is no palpable fluid collection. He is high risk for complication given his Z6XW however this is well controlled. He has great pedal pulses and is on anticoagulation for the arterial thrombosis he had last year.  Plan:  --keflex 500mg  4x daily for 10d --f/u in 5-7d for re-evaluation --return precautions discussed      Relevant Orders   Ambulatory referral to Podiatry   AMB referral to wound care center    Other Visit Diagnoses    Need for immunization against influenza       Relevant Orders   Flu Vaccine QUAD 36+ mos IM (Completed)        Pt discussed with Dr. Marcille Buffy, MD Internal Medicine Resident PGY-2 [facility] Pager: [phone] 01/13/2020 2:14 PM

## 2020-01-13 NOTE — Assessment & Plan Note (Addendum)
Patient with well controlled T2DM presenting with 2w history of nonhealing ulceration of the distal aspect of the right 3rd toe.  He has been afebrile. It is mildly erythematous and edematous on exam but does not track into the foot. There is minimal drainage that is serosanguinous. There is no palpable fluid collection. He is high risk for complication given his Y3FX however this is well controlled. He has great pedal pulses and is on anticoagulation for the arterial thrombosis he had last year.  Plan:  --keflex 500mg  4x daily for 10d --f/u in 5-7d for re-evaluation --return precautions discussed

## 2020-01-17 NOTE — Progress Notes (Signed)
Internal Medicine Clinic Attending  Case discussed with Dr. Christian  At the time of the visit.  We reviewed the resident's history and exam and pertinent patient test results.  I agree with the assessment, diagnosis, and plan of care documented in the resident's note.  

## 2020-01-19 ENCOUNTER — Ambulatory Visit (INDEPENDENT_AMBULATORY_CARE_PROVIDER_SITE_OTHER): Payer: Medicare Other | Admitting: Internal Medicine

## 2020-01-19 ENCOUNTER — Other Ambulatory Visit: Payer: Self-pay

## 2020-01-19 ENCOUNTER — Encounter: Payer: Self-pay | Admitting: Internal Medicine

## 2020-01-19 DIAGNOSIS — L03115 Cellulitis of right lower limb: Secondary | ICD-10-CM

## 2020-01-20 ENCOUNTER — Ambulatory Visit (INDEPENDENT_AMBULATORY_CARE_PROVIDER_SITE_OTHER): Payer: Medicare Other | Admitting: Podiatry

## 2020-01-20 ENCOUNTER — Other Ambulatory Visit: Payer: Self-pay

## 2020-01-20 DIAGNOSIS — L97511 Non-pressure chronic ulcer of other part of right foot limited to breakdown of skin: Secondary | ICD-10-CM

## 2020-01-20 DIAGNOSIS — M79675 Pain in left toe(s): Secondary | ICD-10-CM | POA: Diagnosis not present

## 2020-01-20 DIAGNOSIS — M79674 Pain in right toe(s): Secondary | ICD-10-CM | POA: Diagnosis not present

## 2020-01-20 DIAGNOSIS — E1142 Type 2 diabetes mellitus with diabetic polyneuropathy: Secondary | ICD-10-CM | POA: Diagnosis not present

## 2020-01-20 DIAGNOSIS — B351 Tinea unguium: Secondary | ICD-10-CM

## 2020-01-20 MED ORDER — DOXYCYCLINE HYCLATE 100 MG PO TABS: 100.0000 mg | ORAL_TABLET | Freq: Two times a day (BID) | ORAL | 0 refills | Status: DC

## 2020-01-20 MED ORDER — HYDROCORTISONE 1 % EX OINT: 1.0000 "application " | TOPICAL_OINTMENT | Freq: Two times a day (BID) | CUTANEOUS | 0 refills | Status: DC

## 2020-01-20 NOTE — Assessment & Plan Note (Signed)
This is a follow up visit from his initial appointment with me on 9/16 for re-evaluation of a right 3rd toe ulcer. There are no significant changes of the wound today--does not appear any better or worse. Interestingly, he did have patchy areas of coolness of his lower extremities and I was not able to palpate his pedal pulses. I was initially concerned about an ischemic foot, especially in the setting of arterial thrombosis earlier this year, however pulses were able to be easily heard with the use of a doppler.  Plan --appt scheduled with podiatry in the morning--appreciate their assistance --I will have him complete the course of keflex  --f/u next week after completion of antibiotic --return precautions discussed

## 2020-01-20 NOTE — Progress Notes (Signed)
Office Visit   Patient ID: Jonathon [name], male    DOB: 1950/09/18, 69 y.o.   MRN: [id number]  Subjective:  CC: foot wound follow up  HPI 69 y.o. presents today for the above.  I saw him in the clinic on 9/16 for a right 3rd toe ulcer that was present for 2w. He did not exhibit any systemic symptoms at that time so I gave him a 10d course of keflex and instructed him to follow up in a week. I had referred him to wound care but unfortunately they were unable to get him in until October. Today, he notes that he has been taking his keflex. He continues to deny any systemic symptoms and denies any drainage from that toe. He notes that he has an appointment with podiatry tomorrow for evaluation of his toe nails.      ACTIVE MEDICATIONS   Current Outpatient Medications on File Prior to Visit  Medication Sig Dispense Refill  . aspirin EC 81 MG EC tablet Take 1 tablet (81 mg total) by mouth daily.    [name] atorvastatin (LIPITOR) 40 MG tablet Take 40 mg by mouth at bedtime.     . cephALEXin (KEFLEX) 500 MG capsule Take 1 capsule (500 mg total) by mouth 4 (four) times daily for 10 days. 40 capsule 0  . fenofibrate 54 MG tablet TAKE 1 TABLET(54 MG) BY MOUTH DAILY 90 tablet 1  . JARDIANCE 10 MG TABS tablet TAKE 1 TABLET BY MOUTH EVERY DAY 90 tablet 1  . levothyroxine (SYNTHROID) 112 MCG tablet Take 112 mcg by mouth at bedtime.     [name] losartan (COZAAR) 25 MG tablet TAKE 1/2 TABLET(12.5 MG) BY MOUTH DAILY (Patient taking differently: Take 12.5 mg by mouth daily. ) 45 tablet 3  . metoprolol succinate (TOPROL-XL) 50 MG 24 hr tablet TAKE 1 TABLET(50 MG) BY MOUTH DAILY WITH OR IMMEDIATELY FOLLOWING A MEAL 90 tablet 1  . oxybutynin (DITROPAN-XL) 10 MG 24 hr tablet TAKE 1 TABLET(10 MG) BY MOUTH AT BEDTIME 90 tablet 1  . oxyCODONE (OXY IR/ROXICODONE) 5 MG immediate release tablet Take 1 tablet (5 mg total) by mouth every 6 (six) hours as needed for moderate pain. 20 tablet 0  . pantoprazole (PROTONIX) 40 MG  tablet TAKE 1 TABLET BY MOUTH TWICE DAILY 180 tablet 1  . PARoxetine (PAXIL) 40 MG tablet Take 40 mg by mouth at bedtime.     . pregabalin (LYRICA) 75 MG capsule Take 150 mg by mouth at bedtime.     . rivaroxaban (XARELTO) 20 MG TABS tablet Take 1 tablet (20 mg total) by mouth daily with supper. 30 tablet 6  . sildenafil (VIAGRA) 25 MG tablet Take 1 tablet (25 mg total) by mouth daily as needed for erectile dysfunction. 20 tablet 1  . temazepam (RESTORIL) 15 MG capsule Take 1 capsule (15 mg total) by mouth at bedtime. 30 capsule 5  . testosterone cypionate (DEPOTESTOTERONE CYPIONATE) 100 MG/ML injection Inject 200 mg into the muscle every 14 (fourteen) days.      No current facility-administered medications on file prior to visit.    ROS  Review of Systems  Constitutional: Negative for chills and fever.  Cardiovascular: Negative for leg swelling.  Musculoskeletal: Negative.   Skin: Positive for rash and wound. Negative for color change.    Objective:   BP 110/88 (BP Location: Left Arm, Cuff Size: Normal)   Pulse 93   Temp 97.8 F (36.6 C) (Oral)   Ht 6' (1.829 m)  Wt 227 lb 9.6 oz (103.2 kg)   SpO2 99% Comment: Room air  BMI 30.87 kg/m  Wt Readings from Last 3 Encounters:  01/19/20 227 lb 9.6 oz (103.2 kg)  01/12/20 223 lb 14.4 oz (101.6 kg)  12/26/19 226 lb 8 oz (102.7 kg)   BP Readings from Last 3 Encounters:  01/19/20 110/88  01/12/20 134/77  12/26/19 105/68   Physical Exam Constitutional:      Appearance: Normal appearance. He is not toxic-appearing.  Cardiovascular:     Pulses: Decreased pulses.          Dorsalis pedis pulses are detected w/ Doppler on the right side and detected w/ Doppler on the left side.       Posterior tibial pulses are detected w/ Doppler on the right side and detected w/ Doppler on the left side.     Comments: Lower extremities cool to touch  Musculoskeletal:     Right lower leg: No edema.     Left lower leg: No edema.  Feet:     Right  foot:     Skin integrity: Ulcer present. No erythema or warmth.     Toenail Condition: Right toenails are abnormally thick.     Left foot:     Toenail Condition: Left toenails are abnormally thick.     Comments: Erythematous rash present on the plantar surfaces of both feet as well as the interdigit space between the first and second toe on the left. Rash associated with small scattered petiache. Skin:    Findings: Rash present.     Health Maintenance:   Health Maintenance  Topic Date Due  . Hepatitis C Screening  Never done  . OPHTHALMOLOGY EXAM  Never done  . COVID-19 Vaccine (1) Never done  . COLONOSCOPY  Never done  . PNA vac Low Risk Adult (1 of 2 - PCV13) Never done  . HEMOGLOBIN A1C  03/07/2020  . FOOT EXAM  12/15/2020  . TETANUS/TDAP  08/02/2023  . INFLUENZA VACCINE  Completed     Assessment & Plan:   Problem List Items Addressed This Visit      Musculoskeletal and Integument   Cellulitis of right foot    This is a follow up visit from his initial appointment with me on 9/16 for re-evaluation of a right 3rd toe ulcer. There are no significant changes of the wound today--does not appear any better or worse. Interestingly, he did have patchy areas of coolness of his lower extremities and I was not able to palpate his pedal pulses. I was initially concerned about an ischemic foot, especially in the setting of arterial thrombosis earlier this year, however pulses were able to be easily heard with the use of a doppler.  Plan --appt scheduled with podiatry in the morning--appreciate their assistance --I will have him complete the course of keflex  --f/u next week after completion of antibiotic --return precautions discussed           Pt discussed and seen with Dr. Dareen Piano.  Mitzi Hansen, MD Internal Medicine Resident PGY-2 [facility] Pager: [phone] 01/20/2020 2:44 PM

## 2020-01-22 ENCOUNTER — Other Ambulatory Visit: Payer: Self-pay | Admitting: Internal Medicine

## 2020-01-23 NOTE — Progress Notes (Signed)
Internal Medicine Clinic Attending  I saw and evaluated the patient.  I personally confirmed the key portions of the history and exam documented by Dr. Christian   and I reviewed pertinent patient test results.  The assessment, diagnosis, and plan were formulated together and I agree with the documentation in the resident's note.  

## 2020-01-24 ENCOUNTER — Encounter: Payer: Self-pay | Admitting: Podiatry

## 2020-01-24 NOTE — Progress Notes (Signed)
Subjective:  Patient ID: Jonathon [name], male    DOB: [birth year],  MRN: [id number]  Chief Complaint  Patient presents with  . Foot Ulcer  . Nail Problem    69 y.o. male presents with the above complaint.  Patient presents with a complaint of right third digit distal tip ulceration.  Patient states that this has been going for quite some time there is some redness around it.  Patient does not have any pain but just concerned with it.  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  He also has complaint of thickened elongated dystrophic toenails x9.  He would like to have them debrided down.  He denies any other acute complaints.  He has not seen anyone else prior to see me for this ulceration.  Patient is a diabetic with last A1c of 5.1   Review of Systems: Negative except as noted in the HPI. Denies N/V/F/Ch.  Past Medical History:  Diagnosis Date  . Anemia due to blood loss, acute 2014   2nd GIB  . Anxiety   . CKD (chronic kidney disease), stage III 2014  . Diabetes mellitus without complication ([place])   . Gout   . High cholesterol   . Hypertension 2014  . Hypothyroid   . Neuropathy   . PUD (peptic ulcer disease) 2014    Current Outpatient Medications:  .  fenofibrate 54 MG tablet, TAKE 1 TABLET(54 MG) BY MOUTH DAILY, Disp: 90 tablet, Rfl: 1 .  aspirin EC 81 MG EC tablet, Take 1 tablet (81 mg total) by mouth daily., Disp:  , Rfl:  .  atorvastatin (LIPITOR) 40 MG tablet, Take 40 mg by mouth at bedtime. , Disp: , Rfl:  .  doxycycline (VIBRA-TABS) 100 MG tablet, Take 1 tablet (100 mg total) by mouth 2 (two) times daily., Disp: 20 tablet, Rfl: 0 .  hydrocortisone 1 % ointment, Apply 1 application topically 2 (two) times daily., Disp: 30 g, Rfl: 0 .  JARDIANCE 10 MG TABS tablet, TAKE 1 TABLET BY MOUTH EVERY DAY, Disp: 90 tablet, Rfl: 1 .  levothyroxine (SYNTHROID) 112 MCG tablet, Take 112 mcg by mouth at bedtime. , Disp: , Rfl:  .  losartan (COZAAR) 25  MG tablet, TAKE 1/2 TABLET(12.5 MG) BY MOUTH DAILY (Patient taking differently: Take 12.5 mg by mouth daily. ), Disp: 45 tablet, Rfl: 3 .  metoprolol succinate (TOPROL-XL) 50 MG 24 hr tablet, TAKE 1 TABLET(50 MG) BY MOUTH DAILY WITH OR IMMEDIATELY FOLLOWING A MEAL, Disp: 90 tablet, Rfl: 1 .  oxybutynin (DITROPAN-XL) 10 MG 24 hr tablet, TAKE 1 TABLET(10 MG) BY MOUTH AT BEDTIME, Disp: 90 tablet, Rfl: 1 .  oxyCODONE (OXY IR/ROXICODONE) 5 MG immediate release tablet, Take 1 tablet (5 mg total) by mouth every 6 (six) hours as needed for moderate pain., Disp: 20 tablet, Rfl: 0 .  pantoprazole (PROTONIX) 40 MG tablet, TAKE 1 TABLET BY MOUTH TWICE DAILY, Disp: 180 tablet, Rfl: 1 .  PARoxetine (PAXIL) 40 MG tablet, Take 40 mg by mouth at bedtime. , Disp: , Rfl:  .  pregabalin (LYRICA) 75 MG capsule, Take 150 mg by mouth at bedtime. , Disp: , Rfl:  .  rivaroxaban (XARELTO) 20 MG TABS tablet, Take 1 tablet (20 mg total) by mouth daily with supper., Disp: 30 tablet, Rfl: 6 .  sildenafil (VIAGRA) 25 MG tablet, Take 1 tablet (25 mg total) by mouth daily as needed for erectile dysfunction., Disp: 20 tablet, Rfl: 1 .  temazepam (RESTORIL) 15 MG capsule, Take 1 capsule (15 mg total) by mouth at bedtime., Disp: 30 capsule, Rfl: 5 .  testosterone cypionate (DEPOTESTOTERONE CYPIONATE) 100 MG/ML injection, Inject 200 mg into the muscle every 14 (fourteen) days. , Disp: , Rfl:   Social History   Tobacco Use  Smoking Status Former Smoker  . Packs/day: 2.00  . Years: 3.00  . Pack years: 6.00  . Types: Cigarettes  Smokeless Tobacco Former Systems developer  . Quit date: 05/08/1967    Allergies  Allergen Reactions  . Morphine And Related Itching   Objective:  There were no vitals filed for this visit. There is no height or weight on file to calculate BMI. Constitutional Well developed. Well nourished.  Vascular Dorsalis pedis pulses palpable bilaterally. Posterior tibial pulses palpable bilaterally. Capillary refill  normal to all digits.  No cyanosis or clubbing noted. Pedal hair growth normal.  Neurologic Normal speech. Oriented to person, place, and time. Epicritic sensation to light touch grossly present bilaterally.  Dermatologic  right third digit distal tip partial thickness ulceration with granular base.  Mild hyperkeratotic periwound noted.  No purulent drainage noted.  Mild erythema noted.  No malodor or other clinical signs of infection noted.  Nail Exam: Pt has thick disfigured discolored nails with subungual debris noted bilateral entire nail hallux through fifth toenails except right great toe which is amputated.  Pain on palpation to the nails.   Orthopedic: Normal joint ROM without pain or crepitus bilaterally. No visible deformities. No bony tenderness.   Radiographs: None Assessment:   1. Skin ulcer of third toe, right, limited to breakdown of skin ([place])   2. Pain due to onychomycosis of toenails of both feet   3. Diabetic polyneuropathy associated with type 2 diabetes mellitus ([place])    Plan:  Patient was evaluated and treated and all questions answered.  Right third digit distal tip ulceration partial thickness/limited to the breakdown of the skin -I explained to the patient the etiology of the wound and various treatment options were discussed.  The driving force behind it is a contracture of the digit in a hammered position leading to excessive pressure to the right third digit.  I discussed briefly with the possible future flexor tenotomy once the ulceration has resolved. -Doxycycline was dispensed for skin and soft tissue prophylaxis -Surgical shoe was dispensed  Onychomycosis with pain  -Nails palliatively debrided as below. -Educated on self-care  Procedure: Nail Debridement Rationale: pain  Type of Debridement: manual, sharp debridement. Instrumentation: Nail nipper, rotary burr. Number of Nails: 9  Procedures and Treatment: Consent by patient was obtained for  treatment procedures. The patient understood the discussion of treatment and procedures well. All questions were answered thoroughly reviewed. Debridement of mycotic and hypertrophic toenails, 1 through 5 bilateral and clearing of subungual debris. No ulceration, no infection noted.  Return Visit-Office Procedure: Patient instructed to return to the office for a follow up visit 3 months for continued evaluation and treatment.  Boneta Lucks, DPM    No follow-ups on file.   No follow-ups on file.

## 2020-01-26 ENCOUNTER — Ambulatory Visit (INDEPENDENT_AMBULATORY_CARE_PROVIDER_SITE_OTHER): Payer: Medicare Other | Admitting: Internal Medicine

## 2020-01-26 ENCOUNTER — Encounter: Payer: Self-pay | Admitting: Internal Medicine

## 2020-01-26 DIAGNOSIS — L97511 Non-pressure chronic ulcer of other part of right foot limited to breakdown of skin: Secondary | ICD-10-CM | POA: Diagnosis not present

## 2020-01-26 DIAGNOSIS — I749 Embolism and thrombosis of unspecified artery: Secondary | ICD-10-CM

## 2020-01-26 MED ORDER — CLOTRIMAZOLE 1 % EX OINT: 1.0000 g | TOPICAL_OINTMENT | Freq: Two times a day (BID) | CUTANEOUS | 0 refills | Status: DC

## 2020-01-26 NOTE — Patient Instructions (Addendum)
I have sent an antifungal cream to your pharmacy. Apply this twice a day. Discontinue the hydrocortisone cream. Continue the doxycyline to complete the course. Please continue to follow up with the podiatrist.

## 2020-01-27 NOTE — Assessment & Plan Note (Signed)
This is a follow up visit. Pt recently seen at podiatry on 9/24 at which time he was transitioned to doxycycline and started on steroid cream for the foot rash. There is no significant change in the ulcer appearance at today's visit. It does not appear infected and pt denies fevers, chills, or wound drainage. Podiatry discussed potential need for repair of hammer toes after ulcer heals.  The rash on the soles of his feet has taken on the appearance of a possible fungal infection. Does not itch Plan --continue doxycycline. As noted by podiatry also, does not appear infected but doxycline is being used for infection prophylaxis --out of concern for tinea pedis, will have him stop the hydrocortisone cream and start clotrimazole cream. --discussed that he should continue to follow with podiatry for wound. He does not need to follow up with [place] for this unless something comes up --return precautions discussed at today's visit.

## 2020-01-27 NOTE — Progress Notes (Signed)
Internal Medicine Clinic Attending  I saw and evaluated the patient.  I personally confirmed the key portions of the history and exam documented by Dr. Christian   and I reviewed pertinent patient test results.  The assessment, diagnosis, and plan were formulated together and I agree with the documentation in the resident's note.  

## 2020-01-27 NOTE — Progress Notes (Signed)
Office Visit   Patient ID: Jonathon [name], male    DOB: Jan 26, 1951, 69 y.o.   MRN: [id number]  Subjective:  CC: right foot wound follow up  HPI 69 y.o. presents today for the above.  9/16: IOV. Started on 10d course of keflex and referred to podiatry. 9/23: follow up OV. No significant changes in wound. New erythematous rash noted on feet bilaterally. Primarily on soles of his feet but also had a dark red rash on the dorsal aspect of his left foot between digits one and two. 9/24: OV with podiatry-transitioned to doxycycline for wound prophylaxis and given hydrocortisone cream for foot rash 9/30: IMC OV. Pt continues to deny fever, chills or drainage from the wound. He has been taking the doxycyline daily. Still no pain in the foot.       ACTIVE MEDICATIONS   Current Outpatient Medications on File Prior to Visit  Medication Sig Dispense Refill  . fenofibrate 54 MG tablet TAKE 1 TABLET(54 MG) BY MOUTH DAILY 90 tablet 1  . aspirin EC 81 MG EC tablet Take 1 tablet (81 mg total) by mouth daily.    [name] atorvastatin (LIPITOR) 40 MG tablet Take 40 mg by mouth at bedtime.     [name] doxycycline (VIBRA-TABS) 100 MG tablet Take 1 tablet (100 mg total) by mouth 2 (two) times daily. 20 tablet 0  . hydrocortisone 1 % ointment Apply 1 application topically 2 (two) times daily. 30 g 0  . JARDIANCE 10 MG TABS tablet TAKE 1 TABLET BY MOUTH EVERY DAY 90 tablet 1  . levothyroxine (SYNTHROID) 112 MCG tablet Take 112 mcg by mouth at bedtime.     [name] losartan (COZAAR) 25 MG tablet TAKE 1/2 TABLET(12.5 MG) BY MOUTH DAILY (Patient taking differently: Take 12.5 mg by mouth daily. ) 45 tablet 3  . metoprolol succinate (TOPROL-XL) 50 MG 24 hr tablet TAKE 1 TABLET(50 MG) BY MOUTH DAILY WITH OR IMMEDIATELY FOLLOWING A MEAL 90 tablet 1  . oxybutynin (DITROPAN-XL) 10 MG 24 hr tablet TAKE 1 TABLET(10 MG) BY MOUTH AT BEDTIME 90 tablet 1  . oxyCODONE (OXY IR/ROXICODONE) 5 MG immediate release tablet Take 1 tablet (5 mg  total) by mouth every 6 (six) hours as needed for moderate pain. 20 tablet 0  . pantoprazole (PROTONIX) 40 MG tablet TAKE 1 TABLET BY MOUTH TWICE DAILY 180 tablet 1  . PARoxetine (PAXIL) 40 MG tablet Take 40 mg by mouth at bedtime.     . pregabalin (LYRICA) 75 MG capsule Take 150 mg by mouth at bedtime.     . rivaroxaban (XARELTO) 20 MG TABS tablet Take 1 tablet (20 mg total) by mouth daily with supper. 30 tablet 6  . sildenafil (VIAGRA) 25 MG tablet Take 1 tablet (25 mg total) by mouth daily as needed for erectile dysfunction. 20 tablet 1  . temazepam (RESTORIL) 15 MG capsule Take 1 capsule (15 mg total) by mouth at bedtime. 30 capsule 5  . testosterone cypionate (DEPOTESTOTERONE CYPIONATE) 100 MG/ML injection Inject 200 mg into the muscle every 14 (fourteen) days.      No current facility-administered medications on file prior to visit.    ROS  Review of Systems  Constitutional: Negative for chills and fever.  Musculoskeletal: Negative for arthralgias and joint swelling.  Skin: Positive for rash and wound.    Objective:   BP 127/87 (BP Location: Left Arm, Patient Position: Sitting, Cuff Size: Normal)   Pulse 73   Temp 98.2 F (36.8 C) (Oral)  Ht 6' (1.829 m)   SpO2 100%   BMI 30.87 kg/m  Wt Readings from Last 3 Encounters:  01/19/20 227 lb 9.6 oz (103.2 kg)  01/12/20 223 lb 14.4 oz (101.6 kg)  12/26/19 226 lb 8 oz (102.7 kg)   BP Readings from Last 3 Encounters:  01/26/20 127/87  01/19/20 110/88  01/12/20 134/77   Physical Exam Constitutional:      Appearance: Normal appearance.  Cardiovascular:     Comments: Pedal pulses present on exam today.lower extremities cool to touch. Capillary refill time 2-3 seconds. Musculoskeletal:     Comments: hammer toe deformity involving the right 2nd and 3rd toes.  Skin:    Comments: Persistent ulcer present on the distal aspect of the right third digit involving the cutaneous and subcutaneous layers. No surrounding erythema. Edema  of the right 3rd and fourth toes are present. No drainage from the wound.  Additonally noted erythematous rash with skin peeling on the soles of the bilateral feet. Unlike the rash on the soles of the feet, there is also a dark red/purple pigmented macule on the dorsal aspect between the first and 2nd toes.     Health Maintenance:   Health Maintenance  Topic Date Due  . Hepatitis C Screening  Never done  . OPHTHALMOLOGY EXAM  Never done  . COVID-19 Vaccine (1) Never done  . COLONOSCOPY  Never done  . PNA vac Low Risk Adult (1 of 2 - PCV13) Never done  . HEMOGLOBIN A1C  03/07/2020  . FOOT EXAM  01/19/2021  . TETANUS/TDAP  08/02/2023  . INFLUENZA VACCINE  Completed     Assessment & Plan:   Problem List Items Addressed This Visit      Other   Skin ulcer of third toe of right foot ([place])    This is a follow up visit. Pt recently seen at podiatry on 9/24 at which time he was transitioned to doxycycline and started on steroid cream for the foot rash. There is no significant change in the ulcer appearance at today's visit. It does not appear infected and pt denies fevers, chills, or wound drainage. Podiatry discussed potential need for repair of hammer toes after ulcer heals.  The rash on the soles of his feet has taken on the appearance of a possible fungal infection. Does not itch Plan --continue doxycycline. As noted by podiatry also, does not appear infected but doxycline is being used for infection prophylaxis --out of concern for tinea pedis, will have him stop the hydrocortisone cream and start clotrimazole cream. --discussed that he should continue to follow with podiatry for wound. He does not need to follow up with [place] for this unless something comes up --return precautions discussed at today's visit.           Pt seen and discussed with Dr. Dareen Piano.  Mitzi Hansen, MD Internal Medicine Resident PGY-2 [facility] Pager:  [phone] 01/27/2020 10:18 AM

## 2020-02-08 ENCOUNTER — Ambulatory Visit (INDEPENDENT_AMBULATORY_CARE_PROVIDER_SITE_OTHER): Payer: Medicare Other | Admitting: Podiatry

## 2020-02-08 ENCOUNTER — Other Ambulatory Visit: Payer: Self-pay

## 2020-02-08 DIAGNOSIS — L97511 Non-pressure chronic ulcer of other part of right foot limited to breakdown of skin: Secondary | ICD-10-CM | POA: Diagnosis not present

## 2020-02-08 DIAGNOSIS — M2041 Other hammer toe(s) (acquired), right foot: Secondary | ICD-10-CM

## 2020-02-08 DIAGNOSIS — E1142 Type 2 diabetes mellitus with diabetic polyneuropathy: Secondary | ICD-10-CM

## 2020-02-08 DIAGNOSIS — M2042 Other hammer toe(s) (acquired), left foot: Secondary | ICD-10-CM | POA: Diagnosis not present

## 2020-02-09 ENCOUNTER — Encounter: Payer: Self-pay | Admitting: Podiatry

## 2020-02-09 NOTE — Progress Notes (Signed)
Subjective:  Patient ID: Jonathon [name], male    DOB: 22-Sep-1950,  MRN: [id number]  Chief Complaint  Patient presents with  . Wound Check    PT denies any pain and stated that he has no concerns     69 y.o. male presents with the above complaint.  Patient presents with a follow-up of right third distal tip ulceration secondary to hammertoe contracture.  Patient states the doxycycline helped give her the pain.  He also denies any other acute complaints.  Overall he seems to be doing better.  He states that he is also interested in diabetic shoes.  He has not gotten them has been over a year since he last got them.   Review of Systems: Negative except as noted in the HPI. Denies N/V/F/Ch.  Past Medical History:  Diagnosis Date  . Anemia due to blood loss, acute 2014   2nd GIB  . Anxiety   . CKD (chronic kidney disease), stage III 2014  . Diabetes mellitus without complication ([place])   . Gout   . High cholesterol   . Hypertension 2014  . Hypothyroid   . Neuropathy   . PUD (peptic ulcer disease) 2014    Current Outpatient Medications:  .  fenofibrate 54 MG tablet, TAKE 1 TABLET(54 MG) BY MOUTH DAILY, Disp: 90 tablet, Rfl: 1 .  aspirin EC 81 MG EC tablet, Take 1 tablet (81 mg total) by mouth daily., Disp:  , Rfl:  .  atorvastatin (LIPITOR) 40 MG tablet, Take 40 mg by mouth at bedtime. , Disp: , Rfl:  .  clotrimazole (LOTRIMIN) 1 % cream, SMARTSIG:1 Gram(s) Topical Morning-Night, Disp: , Rfl:  .  Clotrimazole 1 % OINT, Apply 1 g topically in the morning and at bedtime., Disp: 56.7 g, Rfl: 0 .  doxycycline (VIBRA-TABS) 100 MG tablet, Take 1 tablet (100 mg total) by mouth 2 (two) times daily., Disp: 20 tablet, Rfl: 0 .  hydrocortisone 1 % ointment, Apply 1 application topically 2 (two) times daily., Disp: 30 g, Rfl: 0 .  JARDIANCE 10 MG TABS tablet, TAKE 1 TABLET BY MOUTH EVERY DAY, Disp: 90 tablet, Rfl: 1 .  levothyroxine (SYNTHROID) 112 MCG tablet, Take 112 mcg by mouth at  bedtime. , Disp: , Rfl:  .  losartan (COZAAR) 25 MG tablet, TAKE 1/2 TABLET(12.5 MG) BY MOUTH DAILY (Patient taking differently: Take 12.5 mg by mouth daily. ), Disp: 45 tablet, Rfl: 3 .  metoprolol succinate (TOPROL-XL) 50 MG 24 hr tablet, TAKE 1 TABLET(50 MG) BY MOUTH DAILY WITH OR IMMEDIATELY FOLLOWING A MEAL, Disp: 90 tablet, Rfl: 1 .  oxybutynin (DITROPAN-XL) 10 MG 24 hr tablet, TAKE 1 TABLET(10 MG) BY MOUTH AT BEDTIME, Disp: 90 tablet, Rfl: 1 .  oxyCODONE (OXY IR/ROXICODONE) 5 MG immediate release tablet, Take 1 tablet (5 mg total) by mouth every 6 (six) hours as needed for moderate pain., Disp: 20 tablet, Rfl: 0 .  pantoprazole (PROTONIX) 40 MG tablet, TAKE 1 TABLET BY MOUTH TWICE DAILY, Disp: 180 tablet, Rfl: 1 .  PARoxetine (PAXIL) 40 MG tablet, Take 40 mg by mouth at bedtime. , Disp: , Rfl:  .  pregabalin (LYRICA) 75 MG capsule, Take 150 mg by mouth at bedtime. , Disp: , Rfl:  .  rivaroxaban (XARELTO) 20 MG TABS tablet, Take 1 tablet (20 mg total) by mouth daily with supper., Disp: 30 tablet, Rfl: 6 .  sildenafil (VIAGRA) 25 MG tablet, Take 1 tablet (25 mg total) by mouth daily as needed for erectile  dysfunction., Disp: 20 tablet, Rfl: 1 .  temazepam (RESTORIL) 15 MG capsule, Take 1 capsule (15 mg total) by mouth at bedtime., Disp: 30 capsule, Rfl: 5 .  testosterone cypionate (DEPOTESTOTERONE CYPIONATE) 100 MG/ML injection, Inject 200 mg into the muscle every 14 (fourteen) days. , Disp: , Rfl:   Social History   Tobacco Use  Smoking Status Former Smoker  . Packs/day: 2.00  . Years: 3.00  . Pack years: 6.00  . Types: Cigarettes  Smokeless Tobacco Former Systems developer  . Quit date: 05/08/1967    Allergies  Allergen Reactions  . Morphine And Related Itching   Objective:  There were no vitals filed for this visit. There is no height or weight on file to calculate BMI. Constitutional Well developed. Well nourished.  Vascular Dorsalis pedis pulses palpable bilaterally. Posterior tibial  pulses palpable bilaterally. Capillary refill normal to all digits.  No cyanosis or clubbing noted. Pedal hair growth normal.  Neurologic Normal speech. Oriented to person, place, and time. Epicritic sensation to light touch grossly present bilaterally.  Dermatologic  right third digit distal tip partial thickness ulceration with granular base.  Mild hyperkeratotic periwound noted.  No purulent drainage noted.  Erythema resolved no malodor or other clinical signs of infection noted.  Right hallux amputation noted.  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  Pain on palpation to the nails.   Orthopedic: Normal joint ROM without pain or crepitus bilaterally. No visible deformities. No bony tenderness.   Radiographs: None Assessment:   1. Skin ulcer of third toe, right, limited to breakdown of skin ([place])   2. Diabetic polyneuropathy associated with type 2 diabetes mellitus ([place])   3. Hammertoe, bilateral    Plan:  Patient was evaluated and treated and all questions answered.  Right third digit distal tip ulceration partial thickness/limited to the breakdown of the skin -I explained to the patient the etiology of the wound and various treatment options were discussed.  The driving force behind it is a contracture of the digit in a hammered position leading to excessive pressure to the right third digit.  I discussed briefly with the possible future flexor tenotomy once the ulceration has resolved. -Complete the doxycycline course.  Redness resolved -Continue utilizing surgical shoe -Debridement as described below  Hammertoe contractures 2 through 5 bilaterally -I explained patient the etiology of hammertoe contractures various treatment options were discussed.  Given the patient is diabetic with  breakdown of skin/ulceration I believe patient will benefit from diabetic shoes to allow for offloading/even distribution of pressure.  Patient states understanding like to obtain them for -He will be scheduled see rec for diabetic shoes   Procedure: Excisional Debridement of Wound Tool: Sharp chisel blade/tissue nipper Rationale: Removal of non-viable soft tissue from the wound to promote healing.  Anesthesia: none Pre-Debridement Wound Measurements: 0.2 cm x 0.1 cm x 0.1 cm  Post-Debridement Wound Measurements: 0.3 cm x 0.2 cm x 0.1 cm  Type of Debridement: Sharp Excisional Tissue Removed: Non-viable soft tissue Blood loss: Minimal (<50cc) Depth of Debridement: subcutaneous tissue. Technique: Sharp excisional debridement to bleeding, viable wound base.  Wound Progress: The wound appears to be about the same since last visit however the redness has improved Site healing conversation 7 Dressing: Dry, sterile, compression dressing. Disposition: Patient tolerated procedure well. Patient to return in 1 week for  follow-up.  No follow-ups on file.     Boneta Lucks, DPM    No follow-ups on file.   No follow-ups on file.

## 2020-02-21 ENCOUNTER — Other Ambulatory Visit: Payer: Self-pay | Admitting: Internal Medicine

## 2020-02-21 MED ORDER — PREGABALIN 75 MG PO CAPS
150.0000 mg | ORAL_CAPSULE | Freq: Every day | ORAL | 0 refills | Status: DC
Start: 2020-02-21 — End: 2020-08-01

## 2020-02-21 NOTE — Telephone Encounter (Signed)
Refill Request- Pt states  He's contacted the pharmacy several times since last week with no response.  Pt states he is now out of the following medication.  pregabalin (LYRICA) 75 MG capsule   [id number] - [place], [place] - [address] AT [address]

## 2020-02-29 ENCOUNTER — Ambulatory Visit (INDEPENDENT_AMBULATORY_CARE_PROVIDER_SITE_OTHER): Payer: Medicare Other | Admitting: Podiatry

## 2020-02-29 ENCOUNTER — Other Ambulatory Visit: Payer: Self-pay

## 2020-02-29 DIAGNOSIS — E1142 Type 2 diabetes mellitus with diabetic polyneuropathy: Secondary | ICD-10-CM

## 2020-02-29 DIAGNOSIS — L97511 Non-pressure chronic ulcer of other part of right foot limited to breakdown of skin: Secondary | ICD-10-CM

## 2020-03-01 ENCOUNTER — Encounter: Payer: Self-pay | Admitting: Podiatry

## 2020-03-01 ENCOUNTER — Other Ambulatory Visit: Payer: Medicare Other | Admitting: Orthotics

## 2020-03-01 NOTE — Progress Notes (Signed)
Subjective:  Patient ID: Jonathon [name], male    DOB: [birth year],  MRN: [id number]  Chief Complaint  Patient presents with  . Wound Check    Right foot 3rd toe. PT has no concerns and stated that he has some pain but no major pain     69 y.o. male presents with the above complaint.  Patient presents with a follow-up of right third distal tip ulceration.  Patient states that the ulcer looks about the same.  His pain is some but not a major pain.  He denies any other acute complaints.   Review of Systems: Negative except as noted in the HPI. Denies N/V/F/Ch.  Past Medical History:  Diagnosis Date  . Anemia due to blood loss, acute 2014   2nd GIB  . Anxiety   . CKD (chronic kidney disease), stage III ([place]) 2014  . Diabetes mellitus without complication ([place])   . Gout   . High cholesterol   . Hypertension 2014  . Hypothyroid   . Neuropathy   . PUD (peptic ulcer disease) 2014    Current Outpatient Medications:  .  aspirin EC 81 MG EC tablet, Take 1 tablet (81 mg total) by mouth daily., Disp:  , Rfl:  .  atorvastatin (LIPITOR) 40 MG tablet, Take 40 mg by mouth at bedtime. , Disp: , Rfl:  .  clotrimazole (LOTRIMIN) 1 % cream, SMARTSIG:1 Gram(s) Topical Morning-Night, Disp: , Rfl:  .  Clotrimazole 1 % OINT, Apply 1 g topically in the morning and at bedtime., Disp: 56.7 g, Rfl: 0 .  doxycycline (VIBRA-TABS) 100 MG tablet, Take 1 tablet (100 mg total) by mouth 2 (two) times daily., Disp: 20 tablet, Rfl: 0 .  fenofibrate 54 MG tablet, TAKE 1 TABLET(54 MG) BY MOUTH DAILY, Disp: 90 tablet, Rfl: 1 .  hydrocortisone 1 % ointment, Apply 1 application topically 2 (two) times daily., Disp: 30 g, Rfl: 0 .  JARDIANCE 10 MG TABS tablet, TAKE 1 TABLET BY MOUTH EVERY DAY, Disp: 90 tablet, Rfl: 1 .  levothyroxine (SYNTHROID) 112 MCG tablet, Take 112 mcg by mouth at bedtime. , Disp: , Rfl:  .  losartan (COZAAR) 25 MG tablet, TAKE 1/2 TABLET(12.5 MG) BY MOUTH DAILY (Patient taking differently: Take  12.5 mg by mouth daily. ), Disp: 45 tablet, Rfl: 3 .  metoprolol succinate (TOPROL-XL) 50 MG 24 hr tablet, TAKE 1 TABLET(50 MG) BY MOUTH DAILY WITH OR IMMEDIATELY FOLLOWING A MEAL, Disp: 90 tablet, Rfl: 1 .  oxybutynin (DITROPAN-XL) 10 MG 24 hr tablet, TAKE 1 TABLET(10 MG) BY MOUTH AT BEDTIME, Disp: 90 tablet, Rfl: 1 .  oxyCODONE (OXY IR/ROXICODONE) 5 MG immediate release tablet, Take 1 tablet (5 mg total) by mouth every 6 (six) hours as needed for moderate pain., Disp: 20 tablet, Rfl: 0 .  pantoprazole (PROTONIX) 40 MG tablet, TAKE 1 TABLET BY MOUTH TWICE DAILY, Disp: 180 tablet, Rfl: 1 .  PARoxetine (PAXIL) 40 MG tablet, Take 40 mg by mouth at bedtime. , Disp: , Rfl:  .  pregabalin (LYRICA) 75 MG capsule, Take 2 capsules (150 mg total) by mouth at bedtime., Disp: 180 capsule, Rfl: 0 .  rivaroxaban (XARELTO) 20 MG TABS tablet, Take 1 tablet (20 mg total) by mouth daily with supper., Disp: 30 tablet, Rfl: 6 .  sildenafil (VIAGRA) 25 MG tablet, Take 1 tablet (25 mg total) by mouth daily as needed for erectile dysfunction., Disp: 20 tablet, Rfl: 1 .  temazepam (RESTORIL) 15 MG capsule, Take 1 capsule (15 mg  total) by mouth at bedtime., Disp: 30 capsule, Rfl: 5 .  testosterone cypionate (DEPOTESTOTERONE CYPIONATE) 100 MG/ML injection, Inject 200 mg into the muscle every 14 (fourteen) days. , Disp: , Rfl:   Social History   Tobacco Use  Smoking Status Former Smoker  . Packs/day: 2.00  . Years: 3.00  . Pack years: 6.00  . Types: Cigarettes  Smokeless Tobacco Former Systems developer  . Quit date: 05/08/1967    Allergies  Allergen Reactions  . Morphine And Related Itching   Objective:  There were no vitals filed for this visit. There is no height or weight on file to calculate BMI. Constitutional Well developed. Well nourished.  Vascular Dorsalis pedis pulses palpable bilaterally. Posterior tibial pulses palpable bilaterally. Capillary refill normal to all digits.  No cyanosis or clubbing  noted. Pedal hair growth normal.  Neurologic Normal speech. Oriented to person, place, and time. Epicritic sensation to light touch grossly present bilaterally.  Dermatologic  right third digit distal tip partial thickness ulceration with granular base.  Mild hyperkeratotic periwound noted.  No purulent drainage noted.  Erythema resolved no malodor or other clinical signs of infection noted.  Right hallux amputation noted.  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  Pain on palpation to the nails.   Orthopedic: Normal joint ROM without pain or crepitus bilaterally. No visible deformities. No bony tenderness.   Radiographs: None Assessment:   1. Skin ulcer of third toe, right, limited to breakdown of skin ([place])   2. Diabetic polyneuropathy associated with type 2 diabetes mellitus ([place])    Plan:  Patient was evaluated and treated and all questions answered.  Right third digit distal tip ulceration partial thickness/limited to the breakdown of the skin -I explained to the patient the etiology of the wound and various treatment options were discussed.  The driving force behind it is a contracture of the digit in a hammered position leading to excessive pressure to the right third digit.  I discussed briefly with the possible future flexor tenotomy once the ulceration has resolved. -Complete the doxycycline course.  Redness resolved -Patient was not utilizing surgical shoe.  I encouraged him to place himself back in the surgical shoe -Debridement as described below  Hammertoe contractures 2 through 5 bilaterally -I explained patient the etiology of hammertoe contractures various treatment options were discussed.  Given the patient is diabetic with breakdown of skin/ulceration I believe patient will  benefit from diabetic shoes to allow for offloading/even distribution of pressure.  Patient states understanding like to obtain them for -He will be scheduled see rec for diabetic shoes   Procedure: Excisional Debridement of Wound~ Tool: Sharp chisel blade/tissue nipper Rationale: Removal of non-viable soft tissue from the wound to promote healing.  Anesthesia: none Pre-Debridement Wound Measurements: 0.2 cm x 0.1 cm x 0.1 cm  Post-Debridement Wound Measurements: 0.3 cm x 0.2 cm x 0.1 cm  Type of Debridement: Sharp Excisional Tissue Removed: Non-viable soft tissue Blood loss: Minimal (<50cc) Depth of Debridement: subcutaneous tissue. Technique: Sharp excisional debridement to bleeding, viable wound base.  Wound Progress: The wound may have restagnation phase. Dressing: Dry, sterile, compression dressing. Disposition: Patient tolerated procedure well. Patient to return in 1 week for follow-up.  No follow-ups on file.     Boneta Lucks, DPM    No follow-ups on file.   No  follow-ups on file.

## 2020-03-14 ENCOUNTER — Encounter: Payer: Self-pay | Admitting: Podiatry

## 2020-03-14 ENCOUNTER — Other Ambulatory Visit: Payer: Self-pay

## 2020-03-14 ENCOUNTER — Ambulatory Visit (INDEPENDENT_AMBULATORY_CARE_PROVIDER_SITE_OTHER): Payer: Medicare Other | Admitting: Podiatry

## 2020-03-14 DIAGNOSIS — L97511 Non-pressure chronic ulcer of other part of right foot limited to breakdown of skin: Secondary | ICD-10-CM

## 2020-03-14 DIAGNOSIS — E1142 Type 2 diabetes mellitus with diabetic polyneuropathy: Secondary | ICD-10-CM

## 2020-03-14 NOTE — Progress Notes (Signed)
Subjective:  Patient ID: Jonathon [name], male    DOB: 01/09/1951,  MRN: [id number]  Chief Complaint  Patient presents with  . Wound Check    3rd toe right foot,     69 y.o. male presents with the above complaint.  Patient presents with a follow-up of right third distal tip ulceration.  Patient states that the ulcer looks about the same.  His pain is some but not a major pain.  He denies any other acute complaints.  There have been no entry point medical Band-Aid.  There is some maceration present at the wound.  The wound does appear to be improving a little bit.   Review of Systems: Negative except as noted in the HPI. Denies N/V/F/Ch.  Past Medical History:  Diagnosis Date  . Anemia due to blood loss, acute 2014   2nd GIB  . Anxiety   . CKD (chronic kidney disease), stage III ([place]) 2014  . Diabetes mellitus without complication ([place])   . Gout   . High cholesterol   . Hypertension 2014  . Hypothyroid   . Neuropathy   . PUD (peptic ulcer disease) 2014    Current Outpatient Medications:  .  aspirin EC 81 MG EC tablet, Take 1 tablet (81 mg total) by mouth daily., Disp:  , Rfl:  .  atorvastatin (LIPITOR) 40 MG tablet, Take 40 mg by mouth at bedtime. , Disp: , Rfl:  .  clotrimazole (LOTRIMIN) 1 % cream, SMARTSIG:1 Gram(s) Topical Morning-Night, Disp: , Rfl:  .  Clotrimazole 1 % OINT, Apply 1 g topically in the morning and at bedtime., Disp: 56.7 g, Rfl: 0 .  doxycycline (VIBRA-TABS) 100 MG tablet, Take 1 tablet (100 mg total) by mouth 2 (two) times daily., Disp: 20 tablet, Rfl: 0 .  fenofibrate 54 MG tablet, TAKE 1 TABLET(54 MG) BY MOUTH DAILY, Disp: 90 tablet, Rfl: 1 .  hydrocortisone 1 % ointment, Apply 1 application topically 2 (two) times daily., Disp: 30 g, Rfl: 0 .  JARDIANCE 10 MG TABS tablet, TAKE 1 TABLET BY MOUTH EVERY DAY, Disp: 90 tablet, Rfl: 1 .  levothyroxine (SYNTHROID) 112 MCG tablet, Take 112 mcg by mouth at bedtime. , Disp: , Rfl:  .  losartan (COZAAR) 25 MG  tablet, TAKE 1/2 TABLET(12.5 MG) BY MOUTH DAILY (Patient taking differently: Take 12.5 mg by mouth daily. ), Disp: 45 tablet, Rfl: 3 .  metoprolol succinate (TOPROL-XL) 50 MG 24 hr tablet, TAKE 1 TABLET(50 MG) BY MOUTH DAILY WITH OR IMMEDIATELY FOLLOWING A MEAL, Disp: 90 tablet, Rfl: 1 .  oxybutynin (DITROPAN-XL) 10 MG 24 hr tablet, TAKE 1 TABLET(10 MG) BY MOUTH AT BEDTIME, Disp: 90 tablet, Rfl: 1 .  oxyCODONE (OXY IR/ROXICODONE) 5 MG immediate release tablet, Take 1 tablet (5 mg total) by mouth every 6 (six) hours as needed for moderate pain., Disp: 20 tablet, Rfl: 0 .  pantoprazole (PROTONIX) 40 MG tablet, TAKE 1 TABLET BY MOUTH TWICE DAILY, Disp: 180 tablet, Rfl: 1 .  PARoxetine (PAXIL) 40 MG tablet, Take 40 mg by mouth at bedtime. , Disp: , Rfl:  .  pregabalin (LYRICA) 75 MG capsule, Take 2 capsules (150 mg total) by mouth at bedtime., Disp: 180 capsule, Rfl: 0 .  rivaroxaban (XARELTO) 20 MG TABS tablet, Take 1 tablet (20 mg total) by mouth daily with supper., Disp: 30 tablet, Rfl: 6 .  sildenafil (VIAGRA) 25 MG tablet, Take 1 tablet (25 mg total) by mouth daily as needed for erectile dysfunction., Disp: 20 tablet,  Rfl: 1 .  temazepam (RESTORIL) 15 MG capsule, Take 1 capsule (15 mg total) by mouth at bedtime., Disp: 30 capsule, Rfl: 5 .  testosterone cypionate (DEPOTESTOTERONE CYPIONATE) 100 MG/ML injection, Inject 200 mg into the muscle every 14 (fourteen) days. , Disp: , Rfl:   Social History   Tobacco Use  Smoking Status Former Smoker  . Packs/day: 2.00  . Years: 3.00  . Pack years: 6.00  . Types: Cigarettes  Smokeless Tobacco Former Systems developer  . Quit date: 05/08/1967    Allergies  Allergen Reactions  . Morphine And Related Itching   Objective:  There were no vitals filed for this visit. There is no height or weight on file to calculate BMI. Constitutional Well developed. Well nourished.  Vascular Dorsalis pedis pulses palpable bilaterally. Posterior tibial pulses palpable  bilaterally. Capillary refill normal to all digits.  No cyanosis or clubbing noted. Pedal hair growth normal.  Neurologic Normal speech. Oriented to person, place, and time. Epicritic sensation to light touch grossly present bilaterally.  Dermatologic  right third digit distal tip partial thickness ulceration with granular base.  Mild hyperkeratotic periwound noted.  No purulent drainage noted.  Erythema resolved no malodor or other clinical signs of infection noted.  Right hallux amputation noted.  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  Pain on palpation to the nails.   Orthopedic: Normal joint ROM without pain or crepitus bilaterally. No visible deformities. No bony tenderness.   Radiographs: None Assessment:   No diagnosis found. Plan:  Patient was evaluated and treated and all questions answered.  Right third digit distal tip ulceration partial thickness/limited to the breakdown of the skin -I explained to the patient the etiology of the wound and various treatment options were discussed.  The driving force behind it is a contracture of the digit in a hammered position leading to excessive pressure to the right third digit.  I discussed briefly with the possible future flexor tenotomy once the ulceration has resolved. -Complete the doxycycline course.  Redness resolved -Patient was not utilizing surgical shoe.  I encouraged him to place himself back in the surgical shoe -Debridement as described below -Apply Betadine wet-to-dry dressing as there is maceration present  Hammertoe contractures 2 through 5 bilaterally -I explained patient the etiology of hammertoe contractures various treatment options were discussed.  Given the patient is diabetic with breakdown of skin/ulceration I believe  patient will benefit from diabetic shoes to allow for offloading/even distribution of pressure.  Patient states understanding like to obtain them for -He will be scheduled see rec for diabetic shoes   Procedure: Excisional Debridement of Wound~stagnant Tool: Sharp chisel blade/tissue nipper Rationale: Removal of non-viable soft tissue from the wound to promote healing.  Anesthesia: none Pre-Debridement Wound Measurements: 0.2 cm x 0.1 cm x 0.1 cm  Post-Debridement Wound Measurements: 0.3 cm x 0.2 cm x 0.1 cm  Type of Debridement: Sharp Excisional Tissue Removed: Non-viable soft tissue Blood loss: Minimal (<50cc) Depth of Debridement: subcutaneous tissue. Technique: Sharp excisional debridement to bleeding, viable wound base.  Wound Progress: The wound may have restagnation phase. Dressing: Dry, sterile, compression dressing. Disposition: Patient tolerated procedure well. Patient to return in 1 week for follow-up.  No follow-ups on file.     Boneta Lucks, DPM    No follow-ups on file.   No follow-ups on  file.

## 2020-03-15 ENCOUNTER — Other Ambulatory Visit: Payer: Self-pay | Admitting: Family Medicine

## 2020-03-27 ENCOUNTER — Ambulatory Visit (INDEPENDENT_AMBULATORY_CARE_PROVIDER_SITE_OTHER): Payer: Medicare Other | Admitting: Internal Medicine

## 2020-03-27 ENCOUNTER — Other Ambulatory Visit: Payer: Self-pay

## 2020-03-27 ENCOUNTER — Encounter: Payer: Self-pay | Admitting: Internal Medicine

## 2020-03-27 VITALS — BP 124/80 | HR 90 | Temp 98.0°F | Ht 72.0 in | Wt 230.9 lb

## 2020-03-27 DIAGNOSIS — Z23 Encounter for immunization: Secondary | ICD-10-CM | POA: Diagnosis not present

## 2020-03-27 DIAGNOSIS — I110 Hypertensive heart disease with heart failure: Secondary | ICD-10-CM | POA: Diagnosis not present

## 2020-03-27 DIAGNOSIS — I482 Chronic atrial fibrillation, unspecified: Secondary | ICD-10-CM

## 2020-03-27 DIAGNOSIS — I749 Embolism and thrombosis of unspecified artery: Secondary | ICD-10-CM | POA: Diagnosis not present

## 2020-03-27 DIAGNOSIS — E039 Hypothyroidism, unspecified: Secondary | ICD-10-CM | POA: Diagnosis not present

## 2020-03-27 DIAGNOSIS — I5022 Chronic systolic (congestive) heart failure: Secondary | ICD-10-CM

## 2020-03-27 DIAGNOSIS — M109 Gout, unspecified: Secondary | ICD-10-CM

## 2020-03-27 DIAGNOSIS — Z0000 Encounter for general adult medical examination without abnormal findings: Secondary | ICD-10-CM | POA: Insufficient documentation

## 2020-03-27 DIAGNOSIS — I10 Essential (primary) hypertension: Secondary | ICD-10-CM

## 2020-03-27 NOTE — Assessment & Plan Note (Signed)
Pt prefers to postpone blood work to next visit. Ok to check TSH next visit. Continue current regimen.

## 2020-03-27 NOTE — Progress Notes (Signed)
   CC: Follow-up of chornic medical conditions: Afib, HF, HTN  HPI:  [name] is a 69 y.o. male with PMHx as documented below, presented for follow-up of chronic medical conditions. Please refer to problem based charting for further details and assessment and plan of current problem and chronic medical conditions.  PMHx: DVT, Afib, CHF, HTN, PAD, DM2, CKD, gout, Hx of urinary and fecal incontinence, Rt superficial artery clot  PMHx: ASA, lipitor, fenofibrate, Jardiance, Levothyroxin, Losartan, Metoprolol, Paroxetine, Xarelto 20 ,Temazepam.  Past Medical History:  Diagnosis Date  . Anemia due to blood loss, acute 2014   2nd GIB  . Anxiety   . CKD (chronic kidney disease), stage III ([place]) 2014  . Diabetes mellitus without complication ([place])   . Gout   . High cholesterol   . Hypertension 2014  . Hypothyroid   . Neuropathy   . PUD (peptic ulcer disease) 2014   Review of Systems:   Constitutional: Negative for chills and fever.  Respiratory: Negative for shortness of breath.   Cardiovascular: Negative for chest pain and leg swelling.  GI: constipation (chronic)  Physical Exam:   Vitals:   03/27/20 1413  BP: 124/80  Pulse: 90  Temp: 98 F (36.7 C)  TempSrc: Oral  SpO2: 99%  Weight: 230 lb 14.4 oz (104.7 kg)  Height: 6' (1.829 m)   Constitutional: Well-developed and well-nourished. No acute distress.  Cardiovascular: irregular rhythm, nl S1S2, no murmur,  no LEE Respiratory: Effort normal and breath sounds normal. No respiratory distress. No wheezes.  GI: No distension, no tenderness.  Psychiatric:  Normal mood and affect. Behavior is normal. Judgment and thought content normal.   Assessment & Plan:   See Encounters Tab for problem based charting.  Patient discussed with Dr. Heber [place]   03/27/2020, 7:16 PM

## 2020-03-27 NOTE — Assessment & Plan Note (Signed)
BP today 124/80. Well controled. (Had prior soft BP). No hypotension despite taking Viagra per patient and his wife.  -Continue Losartan 12.5 mg QD and Metop succinate 50 mg QD -BMP next visit

## 2020-03-27 NOTE — Assessment & Plan Note (Signed)
-  Received COVID-19 vaccine: Moderna 06/02/19 and 3/11/ 21 -PNA vaccine today -Declining screening colonoscopy. Ordering FIT test

## 2020-03-27 NOTE — Patient Instructions (Addendum)
Thank you for allowing [place] to provide your care today. Your blood pressure is great today. Please continue taking your medications as before. Please see your cardiologist for check up and also for follow up of atrial fibrillation.  We will give you a pneumococcal vaccine today. I have ordered stool test for screening of colon cancer.  Today we made no changes to your medications.    Please follow-up in clinic in 3 months.  Should you have any questions or concerns please call the internal medicine clinic at [phone].

## 2020-03-27 NOTE — Assessment & Plan Note (Signed)
Patient is low dose BB, ARB. Volume status is normal today. His last EF was 35-40%. His BP will likeley be a barrier to add further medications and he is euvolumic on exam but recommended him to f/u with his cardiologist. He is also on Jardiance and I will continue that for CV benefit despite nl HbA1c.

## 2020-03-27 NOTE — Assessment & Plan Note (Signed)
Patient has irregular rhythm today. If being in Afib frequently, may take benefit of cardioversion.  -She was last seen by cardiology on 06/2019 -Sendidng non urgent referral for more recommendations -Continue Xarelto and BB

## 2020-03-27 NOTE — Assessment & Plan Note (Addendum)
Uric acid was low last visit. Colchicine and febuxostat stop. No gout flare. Will continue holding meds. May repeat uric acid level in few months.

## 2020-03-29 DIAGNOSIS — Z23 Encounter for immunization: Secondary | ICD-10-CM | POA: Diagnosis not present

## 2020-03-30 NOTE — Progress Notes (Signed)
Internal Medicine Clinic Attending  Case discussed with Dr. Masoudi  At the time of the visit.  We reviewed the resident's history and exam and pertinent patient test results.  I agree with the assessment, diagnosis, and plan of care documented in the resident's note.  

## 2020-04-03 NOTE — Progress Notes (Signed)
Cardiology Office Note:   Date:  04/06/2020  NAME:  Jonathon [name]    MRN: [id number] DOB:  1950/10/15   PCP:  Dewayne Hatch, MD  Cardiologist:  Evalina Field, MD   Referring MD: Aldine Contes, MD   Chief Complaint  Patient presents with  . Follow-up  . Atrial Fibrillation    History of Present Illness:   Jonathon [name] is a 69 y.o. male with a hx of persistent Afib, systolic HF, DM, PAD who presents for follow-up.  He reports he is doing well since her last visit.  When I saw him last he presented to the office with hypotension was found to have an infected toe in his lower extremity.  He underwent amputation.  He seems to be doing remarkably well.  His diabetes is under excellent control with an A1c of 5.1.  Most recent LDL cholesterol 86.  Not quite at goal.  He is due for repeat labs at some point by his primary care physician.  Kidney function is stable as well.  He is having no issues with his atrial fibrillation.  Heart rate is in the 90s today.  His wife reports she thinks he takes metoprolol.  She will need to let us know his medications.  She will call us back today.  He is also on losartan 25 mg half tablet daily.  He reports no chest pain, no shortness of breath or palpitations.  No lower extremity edema reported.  He was found to have systolic dysfunction when he was in the hospital in February with sepsis.  I have recommended we repeat his echocardiogram to determine if further titration of medications as needed.  It is reassuring he requires no diuretic therapy.  He has no symptoms of decompensated heart failure.  Appears to be doing well.  Problem List 1. Persistent atrial fibrillation  -CHADSVASC=4 (age, DM, CHF, PAD) 2. Systolic HF 52-77% 3. PAD -RLE arterial thromboembolic event 4. DVT 5. Diabetes  -A1c 5.1 6.  Hyperlipidemia -Total cholesterol 145, HDL 41, LDL 86, triglycerides 96  Past Medical History: Past Medical History:  Diagnosis Date   . Anemia due to blood loss, acute 2014   2nd GIB  . Anxiety   . CKD (chronic kidney disease), stage III ([place]) 2014  . Diabetes mellitus without complication ([place])   . Gout   . High cholesterol   . Hypertension 2014  . Hypothyroid   . Neuropathy   . PUD (peptic ulcer disease) 2014    Past Surgical History: Past Surgical History:  Procedure Laterality Date  . ABDOMINAL AORTOGRAM W/LOWER EXTREMITY Bilateral 06/27/2019   Procedure: ABDOMINAL AORTOGRAM W/LOWER EXTREMITY;  [name]: Elam Dutch, MD;  Location: [place] [facility];  Service: Vascular;  Laterality: Bilateral;  . ADRENALECTOMY    . AMPUTATION Right 07/18/2019   Procedure: AMPUTATION RIGHT GREAT TOE;  [name]: [name], MD;  Location: [place];  Service: Vascular;  Laterality: Right;  . EMBOLECTOMY Right 06/27/2019   Procedure: RIGHT LEG EMBOLECTOMY;  [name]: Elam Dutch, MD;  [facility]: [facility];  Service: Vascular;  Laterality: Right;    Current Medications: Current Meds  Medication Sig  . aspirin EC 81 MG EC tablet Take 1 tablet (81 mg total) by mouth daily.  [name] atorvastatin (LIPITOR) 40 MG tablet Take 40 mg by mouth at bedtime.   . clotrimazole (LOTRIMIN) 1 % cream SMARTSIG:1 Gram(s) Topical Morning-Night  . Clotrimazole 1 % OINT Apply 1 g topically in the morning and  at bedtime.  . fenofibrate 54 MG tablet TAKE 1 TABLET(54 MG) BY MOUTH DAILY  . hydrocortisone 1 % ointment Apply 1 application topically 2 (two) times daily.  [name] JARDIANCE 10 MG TABS tablet TAKE 1 TABLET BY MOUTH EVERY DAY  . levothyroxine (SYNTHROID) 112 MCG tablet Take 112 mcg by mouth at bedtime.   [name] losartan (COZAAR) 25 MG tablet TAKE 1/2 TABLET(12.5 MG) BY MOUTH DAILY (Patient taking differently: Take 12.5 mg by mouth daily.)  . metoprolol succinate (TOPROL-XL) 50 MG 24 hr tablet TAKE 1 TABLET(50 MG) BY MOUTH DAILY WITH OR IMMEDIATELY FOLLOWING A MEAL  . pantoprazole (PROTONIX) 40 MG tablet TAKE 1 TABLET BY MOUTH TWICE DAILY  .  PARoxetine (PAXIL) 40 MG tablet Take 40 mg by mouth at bedtime.   . pregabalin (LYRICA) 75 MG capsule Take 2 capsules (150 mg total) by mouth at bedtime.  . sildenafil (VIAGRA) 25 MG tablet Take 1 tablet (25 mg total) by mouth daily as needed for erectile dysfunction.  . temazepam (RESTORIL) 15 MG capsule Take 1 capsule (15 mg total) by mouth at bedtime.  [name] testosterone cypionate (DEPOTESTOTERONE CYPIONATE) 100 MG/ML injection Inject 200 mg into the muscle every 14 (fourteen) days.   Alveda Reasons 20 MG TABS tablet TAKE 1 TABLET(20 MG) BY MOUTH DAILY     Allergies:    Morphine and related   Social History: Social History   Socioeconomic History  . Marital status: Married    Spouse name: Not on file  . Number of children: Not on file  . Years of education: Not on file  . Highest education level: Not on file  Occupational History  . Occupation: banking    Comment: retired  Tobacco Use  . Smoking status: Former Smoker    Packs/day: 2.00    Years: 3.00    Pack years: 6.00    Types: Cigarettes  . Smokeless tobacco: Former Systems developer    Quit date: 05/08/1967  Vaping Use  . Vaping Use: Never used  Substance and Sexual Activity  . Alcohol use: Yes    Alcohol/week: 2.0 standard drinks    Types: 2 Shots of liquor per week    Comment: daily  . Drug use: Yes    Types: Marijuana  . Sexual activity: Not on file  Other Topics Concern  . Not on file  Social History Narrative  . Not on file   Social Determinants of Health   Financial Resource Strain: Not on file  Food Insecurity: Not on file  Transportation Needs: Not on file  Physical Activity: Not on file  Stress: Not on file  Social Connections: Not on file     Family History: The patient's family history includes Diabetes Mellitus II in his maternal aunt; Lung cancer in his mother; Pancreatic cancer in his sister.  ROS:   All other ROS reviewed and negative. Pertinent positives noted in the HPI.     EKGs/Labs/Other Studies  Reviewed:   The following studies were personally reviewed by me today:  EKG:  EKG is ordered today.  The ekg ordered today demonstrates atrial fibrillation, heart rate 98, no acute ischemic changes, no evidence of prior infarction, and was personally reviewed by me.   TTE 06/25/2019 1. Left ventricular ejection fraction, by estimation, is 35 to 40%. The  left ventricle has moderately decreased function. The left ventricle  demonstrates global hypokinesis. Unable to assess LV diastolic filling due  ot underlying atrial fibrillation.  2. Right ventricular systolic function is normal.  The right ventricular  size is normal. There is normal pulmonary artery systolic pressure.  3. Left atrial size was mildly dilated.  4. The mitral valve is normal in structure and function. Trivial mitral  valve regurgitation. No evidence of mitral stenosis.  5. The aortic valve is tricuspid. Aortic valve regurgitation is not  visualized. Mild aortic valve sclerosis is present, with no evidence of  aortic valve stenosis.  6. The inferior vena cava is dilated in size with >50% respiratory  variability, suggesting right atrial pressure of 8 mmHg.   Recent Labs: 07/15/2019: ALT 34; TSH 0.969 07/17/2019: Magnesium 1.8 07/18/2019: Hemoglobin 13.8; Platelets 115 07/19/2019: BUN 16; Creatinine, Ser 1.29; Potassium 4.2; Sodium 137   Recent Lipid Panel No results found for: CHOL, TRIG, HDL, CHOLHDL, VLDL, LDLCALC, LDLDIRECT  Physical Exam:   VS:  BP 100/80 (BP Location: Right Arm, Patient Position: Sitting, Cuff Size: Large)   Pulse 98   Ht 6' (1.829 m)   Wt 231 lb (104.8 kg)   BMI 31.33 kg/m    Wt Readings from Last 3 Encounters:  04/06/20 231 lb (104.8 kg)  03/27/20 230 lb 14.4 oz (104.7 kg)  01/19/20 227 lb 9.6 oz (103.2 kg)    General: Well nourished, well developed, in no acute distress Head: Atraumatic, normal size  Eyes: PEERLA, EOMI  Neck: Supple, no JVD Endocrine: No thryomegaly Cardiac:  Normal S1, S2; irregular rhythm, no murmurs rubs or gallops Lungs: Clear to auscultation bilaterally, no wheezing, rhonchi or rales  Abd: Soft, nontender, no hepatomegaly  Ext: No edema, pulses 2+ Musculoskeletal: No deformities, BUE and BLE strength normal and equal Skin: Warm and dry, no rashes   Neuro: Alert and oriented to person, place, time, and situation, CNII-XII grossly intact, no focal deficits  Psych: Normal mood and affect   ASSESSMENT:   Jonathon [name] is a 69 y.o. male who presents for the following: 1. Persistent atrial fibrillation ([place])   2. Chronic systolic heart failure ([place])   3. PAD (peripheral artery disease) ([place])   4. Mixed hyperlipidemia     PLAN:   1. Persistent atrial fibrillation (HCC) -Longstanding history of atrial fibrillation.  He reports has been in A. fib for 15 or more years.  He is unlikely to convert back to normal sinus rhythm unless we use aggressive rhythm control medications.  He has no symptoms from this.  I think for now we will continue with rate control anticoagulation.  -CHADSVASC=4 (age, DM, CHF, PAD). Continue xarelto.   2. Chronic systolic heart failure ([place]) -EF reportedly 35 to 40% when he was in the hospital in February.  No symptoms of heart failure.  Euvolemic on exam.  He is on metoprolol succinate 50 mg a day and losartan.  I would like to repeat an echocardiogram.  This was obtained while he was quite ill.  We need to determine if his function has recovered.  If not we will continue with further titration of medications.  He should continue Jardiance 10 mg a day as well.  3. PAD (peripheral artery disease) ([place]) 4. Mixed hyperlipidemia -Followed by vascular surgery.  Most recent LDL cholesterol 86.  He is on Lipitor 40 mg a day as well as fenofibrate.  He is due for repeat labs in the future by his PCP.  We will see him back in 6 months and go over his lab work.  Goal LDL cholesterol is less than 70 given his diabetes and  PAD.  Disposition: Return in about  6 months (around 10/05/2020).  Medication Adjustments/Labs and Tests Ordered: Current medicines are reviewed at length with the patient today.  Concerns regarding medicines are outlined above.  Orders Placed This Encounter  Procedures  . EKG 12-Lead  . ECHOCARDIOGRAM COMPLETE   No orders of the defined types were placed in this encounter.   Patient Instructions  Medication Instructions:  No changes *If you need a refill on your cardiac medications before your next appointment, please call your pharmacy*   Lab Work: None ordered If you have labs (blood work) drawn today and your tests are completely normal, you will receive your results only by: [name] MyChart Message (if you have MyChart) OR . A paper copy in the mail If you have any lab test that is abnormal or we need to change your treatment, we will call you to review the results.   Testing/Procedures: Your physician has requested that you have an echocardiogram. Echocardiography is a painless test that uses sound waves to create images of your heart. It provides your doctor with information about the size and shape of your heart and how well your heart's chambers and valves are working. This procedure takes approximately one hour. There are no restrictions for this procedure. This test is performed at [address].     Follow-Up: At [facility], you and your health needs are our priority.  As part of our continuing mission to provide you with exceptional heart care, we have created designated Provider Care Teams.  These Care Teams include your primary Cardiologist (physician) and Advanced Practice Providers (APPs -  Physician Assistants and Nurse Practitioners) who all work together to provide you with the care you need, when you need it.  We recommend signing up for the patient portal called "MyChart".  Sign up information is provided on this After Visit Summary.  MyChart is used to  connect with patients for Virtual Visits (Telemedicine).  Patients are able to view lab/test results, encounter notes, upcoming appointments, etc.  Non-urgent messages can be sent to your provider as well.   To learn more about what you can do with MyChart, go to [email or web address].    Your next appointment:   6 month(s)  The format for your next appointment:   In Person  Provider:   Eleonore Chiquito, MD  Your physician wants you to follow-up in: 6 months. You will receive a reminder letter in the mail two months in advance. If you don't receive a letter, please call our office to schedule the follow-up appointment.  Other Instructions Please call our office back to confirm your medication list.     Time Spent with Patient: I have spent a total of 25 minutes with patient reviewing hospital notes, telemetry, EKGs, labs and examining the patient as well as establishing an assessment and plan that was discussed with the patient.  > 50% of time was spent in direct patient care.  Signed, Addison Naegeli. Audie Box, [place]  [address], [place], [place] [address] [phone]  04/06/2020 5:06 PM

## 2020-04-04 ENCOUNTER — Other Ambulatory Visit: Payer: Self-pay

## 2020-04-04 ENCOUNTER — Ambulatory Visit (INDEPENDENT_AMBULATORY_CARE_PROVIDER_SITE_OTHER): Payer: Medicare Other | Admitting: Podiatry

## 2020-04-04 DIAGNOSIS — E1142 Type 2 diabetes mellitus with diabetic polyneuropathy: Secondary | ICD-10-CM

## 2020-04-04 DIAGNOSIS — L97511 Non-pressure chronic ulcer of other part of right foot limited to breakdown of skin: Secondary | ICD-10-CM | POA: Diagnosis not present

## 2020-04-05 ENCOUNTER — Encounter: Payer: Self-pay | Admitting: Podiatry

## 2020-04-05 NOTE — Progress Notes (Signed)
Subjective:  Patient ID: Jonathon [name], male    DOB: October 03, 1950,  MRN: [id number]  Chief Complaint  Patient presents with  . Wound Check    right foot 3rd toe. PT stated that he is doing okay and he has no concerns and denies any pain    69 y.o. male presents with the above complaint.  Patient presents with a follow-up of right third distal tip ulceration.  Patient states that the ulcer looks about the same.  He is maceration has improved.  He they have been applying triple antibiotic and a Band-Aid.  No acute complaints.  T.   Review of Systems: Negative except as noted in the HPI. Denies N/V/F/Ch.  Past Medical History:  Diagnosis Date  . Anemia due to blood loss, acute 2014   2nd GIB  . Anxiety   . CKD (chronic kidney disease), stage III ([place]) 2014  . Diabetes mellitus without complication ([place])   . Gout   . High cholesterol   . Hypertension 2014  . Hypothyroid   . Neuropathy   . PUD (peptic ulcer disease) 2014    Current Outpatient Medications:  .  aspirin EC 81 MG EC tablet, Take 1 tablet (81 mg total) by mouth daily., Disp:  , Rfl:  .  atorvastatin (LIPITOR) 40 MG tablet, Take 40 mg by mouth at bedtime. , Disp: , Rfl:  .  clotrimazole (LOTRIMIN) 1 % cream, SMARTSIG:1 Gram(s) Topical Morning-Night, Disp: , Rfl:  .  Clotrimazole 1 % OINT, Apply 1 g topically in the morning and at bedtime., Disp: 56.7 g, Rfl: 0 .  fenofibrate 54 MG tablet, TAKE 1 TABLET(54 MG) BY MOUTH DAILY, Disp: 90 tablet, Rfl: 1 .  hydrocortisone 1 % ointment, Apply 1 application topically 2 (two) times daily., Disp: 30 g, Rfl: 0 .  JARDIANCE 10 MG TABS tablet, TAKE 1 TABLET BY MOUTH EVERY DAY, Disp: 90 tablet, Rfl: 1 .  levothyroxine (SYNTHROID) 112 MCG tablet, Take 112 mcg by mouth at bedtime. , Disp: , Rfl:  .  losartan (COZAAR) 25 MG tablet, TAKE 1/2 TABLET(12.5 MG) BY MOUTH DAILY (Patient taking differently: Take 12.5 mg by mouth daily. ), Disp: 45 tablet, Rfl: 3 .  metoprolol succinate  (TOPROL-XL) 50 MG 24 hr tablet, TAKE 1 TABLET(50 MG) BY MOUTH DAILY WITH OR IMMEDIATELY FOLLOWING A MEAL, Disp: 90 tablet, Rfl: 1 .  pantoprazole (PROTONIX) 40 MG tablet, TAKE 1 TABLET BY MOUTH TWICE DAILY, Disp: 180 tablet, Rfl: 1 .  PARoxetine (PAXIL) 40 MG tablet, Take 40 mg by mouth at bedtime. , Disp: , Rfl:  .  pregabalin (LYRICA) 75 MG capsule, Take 2 capsules (150 mg total) by mouth at bedtime., Disp: 180 capsule, Rfl: 0 .  sildenafil (VIAGRA) 25 MG tablet, Take 1 tablet (25 mg total) by mouth daily as needed for erectile dysfunction., Disp: 20 tablet, Rfl: 1 .  temazepam (RESTORIL) 15 MG capsule, Take 1 capsule (15 mg total) by mouth at bedtime., Disp: 30 capsule, Rfl: 5 .  testosterone cypionate (DEPOTESTOTERONE CYPIONATE) 100 MG/ML injection, Inject 200 mg into the muscle every 14 (fourteen) days. , Disp: , Rfl:  .  XARELTO 20 MG TABS tablet, TAKE 1 TABLET(20 MG) BY MOUTH DAILY, Disp: 30 tablet, Rfl: 6  Social History   Tobacco Use  Smoking Status Former Smoker  . Packs/day: 2.00  . Years: 3.00  . Pack years: 6.00  . Types: Cigarettes  Smokeless Tobacco Former Systems developer  . Quit date: 05/08/1967    Allergies  Allergen Reactions  . Morphine And Related Itching   Objective:  There were no vitals filed for this visit. There is no height or weight on file to calculate BMI. Constitutional Well developed. Well nourished.  Vascular Dorsalis pedis pulses palpable bilaterally. Posterior tibial pulses palpable bilaterally. Capillary refill normal to all digits.  No cyanosis or clubbing noted. Pedal hair growth normal.  Neurologic Normal speech. Oriented to person, place, and time. Epicritic sensation to light touch grossly present bilaterally.  Dermatologic  right third digit distal tip partial thickness ulceration with granular base.  Mild hyperkeratotic periwound noted.  No purulent drainage noted.  Erythema resolved no malodor or other clinical signs of infection noted.  Right  hallux amputation noted.  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  Pain on palpation to the nails.   Orthopedic: Normal joint ROM without pain or crepitus bilaterally. No visible deformities. No bony tenderness.   Radiographs: None Assessment:   1. Skin ulcer of third toe, right, limited to breakdown of skin ([place])   2. Diabetic polyneuropathy associated with type 2 diabetes mellitus ([place])    Plan:  Patient was evaluated and treated and all questions answered.  Right third digit distal tip ulceration partial thickness/limited to the breakdown of the skin -I explained to the patient the etiology of the wound and various treatment options were discussed.  The driving force behind it is a contracture of the digit in a hammered position leading to excessive pressure to the right third digit.  I discussed briefly with the possible future flexor tenotomy once the ulceration has resolved. -Complete the doxycycline course.  Redness resolved -Patient was not utilizing surgical shoe.  I encouraged him to place himself back in the surgical shoe -Debridement as described below -Apply Betadine wet-to-dry dressing as there is maceration present -He will think about doing flexor tenotomy during next clinical visit.  Hammertoe contractures 2 through 5 bilaterally -I explained patient the etiology of hammertoe contractures various treatment options were discussed.  Given the patient is diabetic with breakdown of skin/ulceration I believe patient will benefit from diabetic shoes to allow for offloading/even distribution of pressure.  Patient states understanding like to obtain them for -He will be scheduled see rec for diabetic shoes   Procedure: Excisional Debridement of Wound~stagnant Tool: Sharp chisel  blade/tissue nipper Rationale: Removal of non-viable soft tissue from the wound to promote healing.  Anesthesia: none Pre-Debridement Wound Measurements: 0.2 cm x 0.1 cm x 0.1 cm  Post-Debridement Wound Measurements: 0.3 cm x 0.2 cm x 0.1 cm  Type of Debridement: Sharp Excisional Tissue Removed: Non-viable soft tissue Blood loss: Minimal (<50cc) Depth of Debridement: subcutaneous tissue. Technique: Sharp excisional debridement to bleeding, viable wound base.  Wound Progress: The wound may have restagnation phase. Dressing: Dry, sterile, compression dressing. Disposition: Patient tolerated procedure well. Patient to return in 1 week for follow-up.  No follow-ups on file.     Boneta Lucks, DPM    No follow-ups on file.   No follow-ups on file.

## 2020-04-06 ENCOUNTER — Other Ambulatory Visit: Payer: Self-pay

## 2020-04-06 ENCOUNTER — Telehealth: Payer: Self-pay | Admitting: Cardiovascular Disease

## 2020-04-06 ENCOUNTER — Encounter: Payer: Self-pay | Admitting: Cardiovascular Disease

## 2020-04-06 ENCOUNTER — Ambulatory Visit (INDEPENDENT_AMBULATORY_CARE_PROVIDER_SITE_OTHER): Payer: Medicare Other | Admitting: Cardiovascular Disease

## 2020-04-06 VITALS — BP 100/80 | HR 98 | Ht 72.0 in | Wt 231.0 lb

## 2020-04-06 DIAGNOSIS — I5022 Chronic systolic (congestive) heart failure: Secondary | ICD-10-CM | POA: Diagnosis not present

## 2020-04-06 DIAGNOSIS — E782 Mixed hyperlipidemia: Secondary | ICD-10-CM

## 2020-04-06 DIAGNOSIS — I4819 Other persistent atrial fibrillation: Secondary | ICD-10-CM

## 2020-04-06 DIAGNOSIS — I739 Peripheral vascular disease, unspecified: Secondary | ICD-10-CM | POA: Diagnosis not present

## 2020-04-06 NOTE — Telephone Encounter (Signed)
Spoke with patients wife Jonathon [name] (okay per DPR), Jonathon [name] states that patient is taking his metoprolol succinate 50mg  daily and wants to let Dr. Audie Box know. Advised patients wife I will forward message to Dr. Audie Box for review. Patients wife verbalized understanding.

## 2020-04-06 NOTE — Telephone Encounter (Signed)
FYI--Patient's wife called to make Dr. Audie Box aware that the patient has been taking metoprolol succinate (TOPROL-XL) 50 MG 24 hr tablet.

## 2020-04-06 NOTE — Patient Instructions (Signed)
Medication Instructions:  No changes *If you need a refill on your cardiac medications before your next appointment, please call your pharmacy*   Lab Work: None ordered If you have labs (blood work) drawn today and your tests are completely normal, you will receive your results only by: [name] MyChart Message (if you have MyChart) OR . A paper copy in the mail If you have any lab test that is abnormal or we need to change your treatment, we will call you to review the results.   Testing/Procedures: Your physician has requested that you have an echocardiogram. Echocardiography is a painless test that uses sound waves to create images of your heart. It provides your doctor with information about the size and shape of your heart and how well your heart's chambers and valves are working. This procedure takes approximately one hour. There are no restrictions for this procedure. This test is performed at [address].     Follow-Up: At  [facility], you and your health needs are our priority.  As part of our continuing mission to provide you with exceptional heart care, we have created designated Provider Care Teams.  These Care Teams include your primary Cardiologist (physician) and Advanced Practice Providers (APPs -  Physician Assistants and Nurse Practitioners) who all work together to provide you with the care you need, when you need it.  We recommend signing up for the patient portal called "MyChart".  Sign up information is provided on this After Visit Summary.  MyChart is used to connect with patients for Virtual Visits (Telemedicine).  Patients are able to view lab/test results, encounter notes, upcoming appointments, etc.  Non-urgent messages can be sent to your provider as well.   To learn more about what you can do with MyChart, go to [email or web address].    Your next appointment:   6 month(s)  The format for your next appointment:   In Person  Provider:   Eleonore Chiquito,  MD  Your physician wants you to follow-up in: 6 months. You will receive a reminder letter in the mail two months in advance. If you don't receive a letter, please call our office to schedule the follow-up appointment.  Other Instructions Please call our office back to confirm your medication list.

## 2020-04-06 NOTE — Telephone Encounter (Signed)
Excellent!  [place] T. Audie Box, [place]  [address], [place], [place] [address] [phone]  4:58 PM

## 2020-04-25 ENCOUNTER — Other Ambulatory Visit: Payer: Self-pay | Admitting: Internal Medicine

## 2020-04-25 ENCOUNTER — Other Ambulatory Visit: Payer: Self-pay

## 2020-04-25 ENCOUNTER — Encounter: Payer: Self-pay | Admitting: Podiatry

## 2020-04-25 ENCOUNTER — Ambulatory Visit (INDEPENDENT_AMBULATORY_CARE_PROVIDER_SITE_OTHER): Payer: Medicare Other | Admitting: Podiatry

## 2020-04-25 DIAGNOSIS — L97511 Non-pressure chronic ulcer of other part of right foot limited to breakdown of skin: Secondary | ICD-10-CM

## 2020-04-25 DIAGNOSIS — M2041 Other hammer toe(s) (acquired), right foot: Secondary | ICD-10-CM | POA: Diagnosis not present

## 2020-04-25 DIAGNOSIS — M2042 Other hammer toe(s) (acquired), left foot: Secondary | ICD-10-CM | POA: Diagnosis not present

## 2020-04-25 DIAGNOSIS — E1142 Type 2 diabetes mellitus with diabetic polyneuropathy: Secondary | ICD-10-CM | POA: Diagnosis not present

## 2020-04-25 NOTE — Progress Notes (Signed)
Subjective:  Patient ID: Jonathon [name], male    DOB: February 22, 1951,  MRN: [id number]  Chief Complaint  Patient presents with  . Foot Ulcer    PT stated that he is doing okay he has no concerns at this time.    69 y.o. male presents with the above complaint.  Patient presents with a follow-up of right third distal tip ulceration.  Patient states that the ulcer looks about the same.  He does not want to tenotomy just yet.  He would like to do diabetic shoes to see if it help offload it.   Review of Systems: Negative except as noted in the HPI. Denies N/V/F/Ch.  Past Medical History:  Diagnosis Date  . Anemia due to blood loss, acute 2014   2nd GIB  . Anxiety   . CKD (chronic kidney disease), stage III ([place]) 2014  . Diabetes mellitus without complication ([place])   . Gout   . High cholesterol   . Hypertension 2014  . Hypothyroid   . Neuropathy   . PUD (peptic ulcer disease) 2014    Current Outpatient Medications:  .  aspirin EC 81 MG EC tablet, Take 1 tablet (81 mg total) by mouth daily., Disp:  , Rfl:  .  atorvastatin (LIPITOR) 40 MG tablet, Take 40 mg by mouth at bedtime. , Disp: , Rfl:  .  clotrimazole (LOTRIMIN) 1 % cream, SMARTSIG:1 Gram(s) Topical Morning-Night, Disp: , Rfl:  .  Clotrimazole 1 % OINT, Apply 1 g topically in the morning and at bedtime., Disp: 56.7 g, Rfl: 0 .  fenofibrate 54 MG tablet, TAKE 1 TABLET(54 MG) BY MOUTH DAILY, Disp: 90 tablet, Rfl: 1 .  hydrocortisone 1 % ointment, Apply 1 application topically 2 (two) times daily., Disp: 30 g, Rfl: 0 .  JARDIANCE 10 MG TABS tablet, TAKE 1 TABLET BY MOUTH EVERY DAY, Disp: 90 tablet, Rfl: 1 .  levothyroxine (SYNTHROID) 112 MCG tablet, Take 112 mcg by mouth at bedtime. , Disp: , Rfl:  .  losartan (COZAAR) 25 MG tablet, TAKE 1/2 TABLET(12.5 MG) BY MOUTH DAILY (Patient taking differently: Take 12.5 mg by mouth daily.), Disp: 45 tablet, Rfl: 3 .  metoprolol succinate (TOPROL-XL) 50 MG 24 hr tablet, TAKE 1 TABLET(50 MG)  BY MOUTH DAILY WITH OR IMMEDIATELY FOLLOWING A MEAL, Disp: 90 tablet, Rfl: 1 .  pantoprazole (PROTONIX) 40 MG tablet, TAKE 1 TABLET BY MOUTH TWICE DAILY, Disp: 180 tablet, Rfl: 1 .  PARoxetine (PAXIL) 40 MG tablet, Take 40 mg by mouth at bedtime. , Disp: , Rfl:  .  pregabalin (LYRICA) 75 MG capsule, Take 2 capsules (150 mg total) by mouth at bedtime., Disp: 180 capsule, Rfl: 0 .  sildenafil (VIAGRA) 25 MG tablet, Take 1 tablet (25 mg total) by mouth daily as needed for erectile dysfunction., Disp: 20 tablet, Rfl: 1 .  temazepam (RESTORIL) 15 MG capsule, Take 1 capsule (15 mg total) by mouth at bedtime., Disp: 30 capsule, Rfl: 5 .  testosterone cypionate (DEPOTESTOTERONE CYPIONATE) 100 MG/ML injection, Inject 200 mg into the muscle every 14 (fourteen) days. , Disp: , Rfl:  .  XARELTO 20 MG TABS tablet, TAKE 1 TABLET(20 MG) BY MOUTH DAILY, Disp: 30 tablet, Rfl: 6  Social History   Tobacco Use  Smoking Status Former Smoker  . Packs/day: 2.00  . Years: 3.00  . Pack years: 6.00  . Types: Cigarettes  Smokeless Tobacco Former Systems developer  . Quit date: 05/08/1967    Allergies  Allergen Reactions  . Morphine  And Related Itching   Objective:  There were no vitals filed for this visit. There is no height or weight on file to calculate BMI. Constitutional Well developed. Well nourished.  Vascular Dorsalis pedis pulses palpable bilaterally. Posterior tibial pulses palpable bilaterally. Capillary refill normal to all digits.  No cyanosis or clubbing noted. Pedal hair growth normal.  Neurologic Normal speech. Oriented to person, place, and time. Epicritic sensation to light touch grossly present bilaterally.  Dermatologic  right third digit distal tip partial thickness ulceration with granular base.  Mild hyperkeratotic periwound noted.  No purulent drainage noted.  Erythema resolved no malodor or other clinical signs of infection noted.  Right hallux amputation noted.  Hammertoe contractures of 2  through 5 noted bilaterally  Nail Exam: Pt has thick disfigured discolored nails with subungual debris noted bilateral entire nail hallux through fifth toenails except right great toe which is amputated.  Pain on palpation to the nails.   Orthopedic: Normal joint ROM without pain or crepitus bilaterally. No visible deformities. No bony tenderness.   Radiographs: None Assessment:   1. Diabetic polyneuropathy associated with type 2 diabetes mellitus ([place])   2. Hammertoe, bilateral   3. Skin ulcer of third toe, right, limited to breakdown of skin [facility])    Plan:  Patient was evaluated and treated and all questions answered.  Right third digit distal tip ulceration partial thickness/limited to the breakdown of the skin -I explained to the patient the etiology of the wound and various treatment options were discussed.  The driving force behind it is a contracture of the digit in a hammered position leading to excessive pressure to the right third digit.  I discussed briefly with the possible future flexor tenotomy once the ulceration has resolved. -Complete the doxycycline course.  Redness resolved -Patient was not utilizing surgical shoe.  I encouraged him to place himself back in the surgical shoe -Debridement as described below -Apply Betadine wet-to-dry dressing as there is maceration present -He will think about doing flexor tenotomy during next clinical visit.  Hammertoe contractures 2 through 5 bilaterally -I explained patient the etiology of hammertoe contractures various treatment options were discussed.  Given the patient is diabetic with breakdown of skin/ulceration I believe patient will benefit from diabetic shoes to allow for offloading/even distribution of pressure.  Patient states understanding like to obtain them for -He will be scheduled see rec for diabetic shoes   Procedure: Excisional Debridement of Wound~stagnant Tool: Sharp chisel blade/tissue nipper Rationale:  Removal of non-viable soft tissue from the wound to promote healing.  Anesthesia: none Pre-Debridement Wound Measurements: 0.2 cm x 0.1 cm x 0.1 cm  Post-Debridement Wound Measurements: 0.3 cm x 0.2 cm x 0.1 cm  Type of Debridement: Sharp Excisional Tissue Removed: Non-viable soft tissue Blood loss: Minimal (<50cc) Depth of Debridement: subcutaneous tissue. Technique: Sharp excisional debridement to bleeding, viable wound base.  Wound Progress: The wound may have restagnation phase. Dressing: Dry, sterile, compression dressing. Disposition: Patient tolerated procedure well. Patient to return in 1 week for follow-up.  No follow-ups on file.     Boneta Lucks, DPM    No follow-ups on file.   No follow-ups on file.

## 2020-04-25 NOTE — Telephone Encounter (Signed)
THis was stopped after discussion with the patient.  Will not refill.

## 2020-05-01 ENCOUNTER — Other Ambulatory Visit (HOSPITAL_COMMUNITY): Payer: Medicare Other

## 2020-05-04 ENCOUNTER — Ambulatory Visit: Payer: Medicare Other | Admitting: Podiatry

## 2020-05-04 DIAGNOSIS — R059 Cough, unspecified: Secondary | ICD-10-CM | POA: Diagnosis not present

## 2020-05-04 DIAGNOSIS — U071 COVID-19: Secondary | ICD-10-CM | POA: Diagnosis not present

## 2020-05-04 DIAGNOSIS — Z20822 Contact with and (suspected) exposure to covid-19: Secondary | ICD-10-CM | POA: Diagnosis not present

## 2020-05-10 ENCOUNTER — Other Ambulatory Visit: Payer: Medicare Other | Admitting: Orthotics

## 2020-05-16 ENCOUNTER — Other Ambulatory Visit: Payer: Self-pay

## 2020-05-16 DIAGNOSIS — I482 Chronic atrial fibrillation, unspecified: Secondary | ICD-10-CM

## 2020-05-16 MED ORDER — LEVOTHYROXINE SODIUM 112 MCG PO TABS: 112.0000 ug | ORAL_TABLET | Freq: Every day | ORAL | 0 refills | Status: DC

## 2020-05-16 MED ORDER — METOPROLOL SUCCINATE ER 50 MG PO TB24: ORAL_TABLET | ORAL | 1 refills | Status: DC

## 2020-05-16 MED ORDER — TEMAZEPAM 15 MG PO CAPS: 15.0000 mg | ORAL_CAPSULE | Freq: Every day | ORAL | 5 refills | Status: DC

## 2020-05-16 MED ORDER — PAROXETINE HCL 40 MG PO TABS: 40.0000 mg | ORAL_TABLET | Freq: Every day | ORAL | 2 refills | Status: DC

## 2020-05-16 MED ORDER — ATORVASTATIN CALCIUM 40 MG PO TABS
40.0000 mg | ORAL_TABLET | Freq: Every day | ORAL | 2 refills | Status: DC
Start: 2020-05-16 — End: 2020-11-01

## 2020-05-16 MED ORDER — FENOFIBRATE 54 MG PO TABS: ORAL_TABLET | ORAL | 1 refills | Status: DC

## 2020-05-16 MED ORDER — PANTOPRAZOLE SODIUM 40 MG PO TBEC: 40.0000 mg | DELAYED_RELEASE_TABLET | Freq: Two times a day (BID) | ORAL | 1 refills | Status: DC

## 2020-05-16 MED ORDER — LOSARTAN POTASSIUM 25 MG PO TABS: ORAL_TABLET | ORAL | 3 refills | Status: DC

## 2020-05-16 NOTE — Telephone Encounter (Signed)
Received TC from patient's wife, [name], who states they are having to switch [facility] [place] [facility] [employer].  She has been trying to have this completed since before Christmas, but [facility] has only transferred 3 RX's.  She is requesting the attached  RX's be refilled from [facility] since she is unable to have them transferred from [facility]. SChaplin, RN,BSN

## 2020-05-18 ENCOUNTER — Ambulatory Visit: Payer: Medicare Other | Admitting: Podiatry

## 2020-05-24 ENCOUNTER — Ambulatory Visit: Payer: Medicare Other | Admitting: Orthotics

## 2020-05-24 ENCOUNTER — Other Ambulatory Visit: Payer: Self-pay

## 2020-05-24 DIAGNOSIS — L97511 Non-pressure chronic ulcer of other part of right foot limited to breakdown of skin: Secondary | ICD-10-CM

## 2020-05-24 DIAGNOSIS — E1142 Type 2 diabetes mellitus with diabetic polyneuropathy: Secondary | ICD-10-CM

## 2020-05-24 DIAGNOSIS — M2041 Other hammer toe(s) (acquired), right foot: Secondary | ICD-10-CM

## 2020-05-24 NOTE — Progress Notes (Signed)

## 2020-05-25 ENCOUNTER — Encounter: Payer: Self-pay | Admitting: Podiatry

## 2020-05-25 ENCOUNTER — Other Ambulatory Visit: Payer: Self-pay

## 2020-05-25 ENCOUNTER — Ambulatory Visit (INDEPENDENT_AMBULATORY_CARE_PROVIDER_SITE_OTHER): Payer: Medicare Other | Admitting: Podiatry

## 2020-05-25 DIAGNOSIS — E1142 Type 2 diabetes mellitus with diabetic polyneuropathy: Secondary | ICD-10-CM

## 2020-05-25 DIAGNOSIS — L97511 Non-pressure chronic ulcer of other part of right foot limited to breakdown of skin: Secondary | ICD-10-CM

## 2020-05-29 ENCOUNTER — Encounter: Payer: Self-pay | Admitting: Podiatry

## 2020-05-29 NOTE — Progress Notes (Signed)
Subjective:  Patient ID: Jonathon [name], male    DOB: 1950/07/06,  MRN: [id number]  Chief Complaint  Patient presents with  . Foot Ulcer    70 y.o. male presents with the above complaint.  Patient presents with a follow-up of right third distal tip ulceration.  Patient states is doing a lot better.  He does not want any tenotomy.  He is a diabetic.  He got diabetic shoes.  He denies any other acute complaints.  He thinks he may be healed.   Review of Systems: Negative except as noted in the HPI. Denies N/V/F/Ch.  Past Medical History:  Diagnosis Date  . Anemia due to blood loss, acute 2014   2nd GIB  . Anxiety   . CKD (chronic kidney disease), stage III ([place]) 2014  . Diabetes mellitus without complication ([place])   . Gout   . High cholesterol   . Hypertension 2014  . Hypothyroid   . Neuropathy   . PUD (peptic ulcer disease) 2014    Current Outpatient Medications:  .  aspirin EC 81 MG EC tablet, Take 1 tablet (81 mg total) by mouth daily., Disp:  , Rfl:  .  atorvastatin (LIPITOR) 40 MG tablet, Take 1 tablet (40 mg total) by mouth at bedtime., Disp: 90 tablet, Rfl: 2 .  clotrimazole (LOTRIMIN) 1 % cream, SMARTSIG:1 Gram(s) Topical Morning-Night, Disp: , Rfl:  .  Clotrimazole 1 % OINT, Apply 1 g topically in the morning and at bedtime., Disp: 56.7 g, Rfl: 0 .  fenofibrate 54 MG tablet, TAKE 1 TABLET(54 MG) BY MOUTH DAILY, Disp: 90 tablet, Rfl: 1 .  hydrocortisone 1 % ointment, Apply 1 application topically 2 (two) times daily., Disp: 30 g, Rfl: 0 .  JARDIANCE 10 MG TABS tablet, TAKE 1 TABLET BY MOUTH EVERY DAY, Disp: 90 tablet, Rfl: 1 .  levothyroxine (SYNTHROID) 112 MCG tablet, Take 1 tablet (112 mcg total) by mouth at bedtime., Disp: 90 tablet, Rfl: 0 .  losartan (COZAAR) 25 MG tablet, TAKE 1/2 TABLET(12.5 MG) BY MOUTH DAILY, Disp: 45 tablet, Rfl: 3 .  metoprolol succinate (TOPROL-XL) 50 MG 24 hr tablet, TAKE 1 TABLET(50 MG) BY MOUTH DAILY WITH OR IMMEDIATELY FOLLOWING A MEAL,  Disp: 90 tablet, Rfl: 1 .  pantoprazole (PROTONIX) 40 MG tablet, Take 1 tablet (40 mg total) by mouth 2 (two) times daily., Disp: 180 tablet, Rfl: 1 .  PARoxetine (PAXIL) 40 MG tablet, Take 1 tablet (40 mg total) by mouth at bedtime., Disp: 90 tablet, Rfl: 2 .  pregabalin (LYRICA) 75 MG capsule, Take 2 capsules (150 mg total) by mouth at bedtime., Disp: 180 capsule, Rfl: 0 .  sildenafil (VIAGRA) 25 MG tablet, Take 1 tablet (25 mg total) by mouth daily as needed for erectile dysfunction., Disp: 20 tablet, Rfl: 1 .  temazepam (RESTORIL) 15 MG capsule, Take 1 capsule (15 mg total) by mouth at bedtime., Disp: 30 capsule, Rfl: 5 .  testosterone cypionate (DEPOTESTOTERONE CYPIONATE) 100 MG/ML injection, Inject 200 mg into the muscle every 14 (fourteen) days. , Disp: , Rfl:  .  XARELTO 20 MG TABS tablet, TAKE 1 TABLET(20 MG) BY MOUTH DAILY, Disp: 30 tablet, Rfl: 6  Social History   Tobacco Use  Smoking Status Former Smoker  . Packs/day: 2.00  . Years: 3.00  . Pack years: 6.00  . Types: Cigarettes  Smokeless Tobacco Former Systems developer  . Quit date: 05/08/1967    Allergies  Allergen Reactions  . Morphine And Related Itching   Objective:  There were no vitals filed for this visit. There is no height or weight on file to calculate BMI. Constitutional Well developed. Well nourished.  Vascular Dorsalis pedis pulses palpable bilaterally. Posterior tibial pulses palpable bilaterally. Capillary refill normal to all digits.  No cyanosis or clubbing noted. Pedal hair growth normal.  Neurologic Normal speech. Oriented to person, place, and time. Epicritic sensation to light touch grossly present bilaterally.  Dermatologic  right third digit skin completely reepithelialized.  No further ulceration noted upon debridement.  Right hallux amputation noted.  Hammertoe contractures of 2 through 5 noted bilaterally  Nail Exam: Pt has thick disfigured discolored nails with subungual debris noted bilateral  entire nail hallux through fifth toenails except right great toe which is amputated.  Pain on palpation to the nails.   Orthopedic: Normal joint ROM without pain or crepitus bilaterally. No visible deformities. No bony tenderness.   Radiographs: None Assessment:   1. Diabetic polyneuropathy associated with type 2 diabetes mellitus ([place])   2. Skin ulcer of third toe, right, limited to breakdown of skin [facility])    Plan:  Patient was evaluated and treated and all questions answered.  Right third digit distal tip ulceration partial thickness/limited to the breakdown of the skin -Clinically healed without doing flexor tenotomy.  At this time instructed him to return to regular activities with his diabetic shoes.  He states understanding and will do that.  Hammertoe contractures 2 through 5 bilaterally -I explained patient the etiology of hammertoe contractures various treatment options were discussed.  Given the patient is diabetic with breakdown of skin/ulceration I believe patient will benefit from diabetic shoes to allow for offloading/even distribution of pressure.  Patient states understanding like to obtain them for -Patient has obtained diabetic shoes and is functioning well in them.   No follow-ups on file.     Boneta Lucks, DPM    No follow-ups on file.   No follow-ups on file.

## 2020-05-30 ENCOUNTER — Ambulatory Visit (HOSPITAL_COMMUNITY): Payer: Medicare Other | Attending: Cardiology

## 2020-05-30 ENCOUNTER — Other Ambulatory Visit: Payer: Self-pay

## 2020-05-30 DIAGNOSIS — I5022 Chronic systolic (congestive) heart failure: Secondary | ICD-10-CM | POA: Diagnosis not present

## 2020-05-30 LAB — ECHOCARDIOGRAM COMPLETE: S' Lateral: 3.8 cm

## 2020-06-15 ENCOUNTER — Other Ambulatory Visit: Payer: Self-pay | Admitting: Internal Medicine

## 2020-07-05 ENCOUNTER — Ambulatory Visit (INDEPENDENT_AMBULATORY_CARE_PROVIDER_SITE_OTHER): Payer: Medicare Other | Admitting: Podiatry

## 2020-07-05 ENCOUNTER — Other Ambulatory Visit: Payer: Self-pay

## 2020-07-05 DIAGNOSIS — E1142 Type 2 diabetes mellitus with diabetic polyneuropathy: Secondary | ICD-10-CM | POA: Diagnosis not present

## 2020-07-05 DIAGNOSIS — E291 Testicular hypofunction: Secondary | ICD-10-CM | POA: Insufficient documentation

## 2020-07-05 DIAGNOSIS — Z89411 Acquired absence of right great toe: Secondary | ICD-10-CM | POA: Diagnosis not present

## 2020-07-05 DIAGNOSIS — F329 Major depressive disorder, single episode, unspecified: Secondary | ICD-10-CM | POA: Insufficient documentation

## 2020-07-05 DIAGNOSIS — F321 Major depressive disorder, single episode, moderate: Secondary | ICD-10-CM | POA: Insufficient documentation

## 2020-07-05 DIAGNOSIS — L97511 Non-pressure chronic ulcer of other part of right foot limited to breakdown of skin: Secondary | ICD-10-CM

## 2020-07-05 DIAGNOSIS — Z89419 Acquired absence of unspecified great toe: Secondary | ICD-10-CM | POA: Insufficient documentation

## 2020-07-05 DIAGNOSIS — L84 Corns and callosities: Secondary | ICD-10-CM

## 2020-07-05 DIAGNOSIS — M792 Neuralgia and neuritis, unspecified: Secondary | ICD-10-CM | POA: Insufficient documentation

## 2020-07-05 DIAGNOSIS — G4700 Insomnia, unspecified: Secondary | ICD-10-CM | POA: Insufficient documentation

## 2020-07-05 DIAGNOSIS — Z794 Long term (current) use of insulin: Secondary | ICD-10-CM | POA: Insufficient documentation

## 2020-07-05 DIAGNOSIS — E1121 Type 2 diabetes mellitus with diabetic nephropathy: Secondary | ICD-10-CM | POA: Insufficient documentation

## 2020-07-05 DIAGNOSIS — K2100 Gastro-esophageal reflux disease with esophagitis, without bleeding: Secondary | ICD-10-CM | POA: Insufficient documentation

## 2020-07-05 NOTE — Progress Notes (Signed)
The patient presented to the office to day to pick up diabetic shoes and 3 pair diabetic custom inserts.  1 pair of inserts were put in the shoes and the shoes were fitted to the patient. The patient states they are comfortable and free of defect. He was satisfied with the fit of the shoe. Instructions for break in and wear were dispensed. The patient signed the delivery documentation and break in instruction form.  If any concerns or questions to call the office.

## 2020-07-18 ENCOUNTER — Encounter: Payer: Self-pay | Admitting: Cardiovascular Disease

## 2020-07-24 ENCOUNTER — Other Ambulatory Visit: Payer: Self-pay | Admitting: Vascular Surgery

## 2020-07-27 ENCOUNTER — Other Ambulatory Visit: Payer: Self-pay | Admitting: *Deleted

## 2020-07-27 NOTE — Telephone Encounter (Signed)
Received faxed refill request fro fenofibrate 54mg  once daily from [facility] [address] [facility]  Last rx was sent to [place]-call made to pt to confirm pharmacy- No answer, message left on recorder.Regenia Skeeter, Nylene Inlow Cassady4/1/202210:01 AM

## 2020-08-01 ENCOUNTER — Ambulatory Visit: Payer: Medicare Other | Admitting: Podiatry

## 2020-08-01 ENCOUNTER — Other Ambulatory Visit: Payer: Self-pay

## 2020-08-01 MED ORDER — PREGABALIN 75 MG PO CAPS: 150.0000 mg | ORAL_CAPSULE | Freq: Every day | ORAL | 0 refills | Status: DC

## 2020-08-01 NOTE — Telephone Encounter (Signed)
Pt is requesting his pregabalin (LYRICA) 75 MG capsule   sent [facility]  [id number] [place], [address] Phone:  [phone]  Fax:  [phone]      ( pt only has one left )

## 2020-08-01 NOTE — Addendum Note (Signed)
Addended by: Ebbie Latus on: 08/01/2020 04:06 PM   Modules accepted: Orders

## 2020-08-01 NOTE — Telephone Encounter (Signed)
I am still not set up to e-prescribe. This ended up printing as a script. Do you mind resending and I will have one of the attendings help me fill it, thanks!

## 2020-08-02 ENCOUNTER — Telehealth: Payer: Self-pay

## 2020-08-02 MED ORDER — PREGABALIN 75 MG PO CAPS: 150.0000 mg | ORAL_CAPSULE | Freq: Every day | ORAL | 0 refills | Status: DC

## 2020-08-02 NOTE — Telephone Encounter (Signed)
See previous encounter

## 2020-08-02 NOTE — Telephone Encounter (Signed)
Pt's wife states pregabalin (LYRICA) 75 MG capsule is not at the pharmacy,  [id number] [place] [address] Phone:  [phone]  Fax:  [phone]     Please call back.

## 2020-08-13 ENCOUNTER — Other Ambulatory Visit: Payer: Self-pay | Admitting: Internal Medicine

## 2020-08-15 NOTE — Telephone Encounter (Signed)
Next appt scheduled 5/17with Dr Court Joy.

## 2020-08-26 NOTE — Progress Notes (Deleted)
Cardiology Office Note:   Date:  08/26/2020  NAME:  Jonathon [name]    MRN: [id number] DOB:  1950-08-06   PCP:  Dewayne Hatch, MD  Cardiologist:  Evalina Field, MD  Electrophysiologist:  None   Referring MD: Dewayne Hatch, *   No chief complaint on file. ***  History of Present Illness:   Jonathon [name] is a 70 y.o. male with a hx of persistent Afib, systolic HF with recovery of EF, PAD, DM, HLD who presents for follow-up. EF has recovered. Needs more aggressive LDL lowering.    Problem List 1. Persistent atrial fibrillation  -CHADSVASC=4 (age, DM, CHF, PAD) 2. Systolic HF w/ recovery of EF -05/2019: 35-40% -05/2020: 50-55% 3. PAD -RLE arterial thromboembolic event 4. DVT 5. Diabetes  -A1c 5.1 6.  Hyperlipidemia -Total cholesterol 145, HDL 41, LDL 86, triglycerides 96  Past Medical History: Past Medical History:  Diagnosis Date  . Anemia due to blood loss, acute 2014   2nd GIB  . Anxiety   . CKD (chronic kidney disease), stage III ([place]) 2014  . Diabetes mellitus without complication ([place])   . Gout   . High cholesterol   . Hypertension 2014  . Hypothyroid   . Neuropathy   . PUD (peptic ulcer disease) 2014    Past Surgical History: Past Surgical History:  Procedure Laterality Date  . ABDOMINAL AORTOGRAM W/LOWER EXTREMITY Bilateral 06/27/2019   Procedure: ABDOMINAL AORTOGRAM W/LOWER EXTREMITY;  Surgeon: Elam Dutch, MD;  Location: [facility];  Service: Vascular;  Laterality: Bilateral;  . ADRENALECTOMY    . AMPUTATION Right 07/18/2019   Procedure: AMPUTATION RIGHT GREAT TOE;  Surgeon: [name], MD;  Location: [facility];  Service: Vascular;  Laterality: Right;  . EMBOLECTOMY Right 06/27/2019   Procedure: RIGHT LEG EMBOLECTOMY;  Surgeon: Elam Dutch, MD;  Location[facility];  Service: Vascular;  Laterality: Right;    Current Medications: No outpatient medications have been marked as taking for the 08/27/20 encounter  (Appointment) with O'Neal, Cassie Freer, MD.     Allergies:    Morphine and related   Social History: Social History   Socioeconomic History  . Marital status: Married    Spouse name: Not on file  . Number of children: Not on file  . Years of education: Not on file  . Highest education level: Not on file  Occupational History  . Occupation: banking    Comment: retired  Tobacco Use  . Smoking status: Former Smoker    Packs/day: 2.00    Years: 3.00    Pack years: 6.00    Types: Cigarettes  . Smokeless tobacco: Former Systems developer    Quit date: 05/08/1967  Vaping Use  . Vaping Use: Never used  Substance and Sexual Activity  . Alcohol use: Yes    Alcohol/week: 2.0 standard drinks    Types: 2 Shots of liquor per week    Comment: daily  . Drug use: Yes    Types: Marijuana  . Sexual activity: Not on file  Other Topics Concern  . Not on file  Social History Narrative  . Not on file   Social Determinants of Health   Financial Resource Strain: Not on file  Food Insecurity: Not on file  Transportation Needs: Not on file  Physical Activity: Not on file  Stress: Not on file  Social Connections: Not on file     Family History: The patient's ***family history includes Diabetes Mellitus II in his maternal aunt; Lung cancer  in his mother; Pancreatic cancer in his sister.  ROS:   All other ROS reviewed and negative. Pertinent positives noted in the HPI.     EKGs/Labs/Other Studies Reviewed:   The following studies were personally reviewed by me today:  EKG:  EKG is *** ordered today.  The ekg ordered today demonstrates ***, and was personally reviewed by me.   TTE 05/30/2020  1. Left ventricular ejection fraction, by estimation, is 50 to 55%. The  left ventricle has low normal function. The left ventricle demonstrates  global hypokinesis. There is mild left ventricular hypertrophy. Left  ventricular diastolic parameters are  indeterminate.  2. Right ventricular systolic  function is normal. The right ventricular  size is normal. Tricuspid regurgitation signal is inadequate for assessing  PA pressure.  3. Left atrial size was mildly dilated.  4. Right atrial size was mildly dilated.  5. The mitral valve is normal in structure. No evidence of mitral valve  regurgitation. No evidence of mitral stenosis.  6. The aortic valve is tricuspid. Aortic valve regurgitation is not  visualized. No aortic stenosis is present.  7. Aortic dilatation noted. There is mild dilatation of the ascending  aorta, measuring 41 mm.  8. The inferior vena cava is normal in size with greater than 50%  respiratory variability, suggesting right atrial pressure of 3 mmHg.  9. The patient was in atrial fibrillation.   Recent Labs: No results found for requested labs within last 8760 hours.   Recent Lipid Panel No results found for: CHOL, TRIG, HDL, CHOLHDL, VLDL, LDLCALC, LDLDIRECT  Physical Exam:   VS:  There were no vitals taken for this visit.   Wt Readings from Last 3 Encounters:  04/06/20 231 lb (104.8 kg)  03/27/20 230 lb 14.4 oz (104.7 kg)  01/19/20 227 lb 9.6 oz (103.2 kg)    General: Well nourished, well developed, in no acute distress Head: Atraumatic, normal size  Eyes: PEERLA, EOMI  Neck: Supple, no JVD Endocrine: No thryomegaly Cardiac: Normal S1, S2; RRR; no murmurs, rubs, or gallops Lungs: Clear to auscultation bilaterally, no wheezing, rhonchi or rales  Abd: Soft, nontender, no hepatomegaly  Ext: No edema, pulses 2+ Musculoskeletal: No deformities, BUE and BLE strength normal and equal Skin: Warm and dry, no rashes   Neuro: Alert and oriented to person, place, time, and situation, CNII-XII grossly intact, no focal deficits  Psych: Normal mood and affect   ASSESSMENT:   Jonathon [name] is a 70 y.o. male who presents for the following: No diagnosis found.  PLAN:   There are no diagnoses linked to this encounter.  Disposition: No follow-ups on  file.  Medication Adjustments/Labs and Tests Ordered: Current medicines are reviewed at length with the patient today.  Concerns regarding medicines are outlined above.  No orders of the defined types were placed in this encounter.  No orders of the defined types were placed in this encounter.   There are no Patient Instructions on file for this visit.   Time Spent with Patient: I have spent a total of *** minutes with patient reviewing hospital notes, telemetry, EKGs, labs and examining the patient as well as establishing an assessment and plan that was discussed with the patient.  > 50% of time was spent in direct patient care.  Signed, Addison Naegeli. Audie Box, MD, [place]  [address] [place], [place] [address] [phone]  08/26/2020 2:59 PM

## 2020-08-27 ENCOUNTER — Ambulatory Visit: Payer: Medicare Other | Admitting: Cardiovascular Disease

## 2020-08-27 DIAGNOSIS — I5022 Chronic systolic (congestive) heart failure: Secondary | ICD-10-CM

## 2020-08-27 DIAGNOSIS — I739 Peripheral vascular disease, unspecified: Secondary | ICD-10-CM

## 2020-08-27 DIAGNOSIS — I4819 Other persistent atrial fibrillation: Secondary | ICD-10-CM

## 2020-08-27 DIAGNOSIS — E782 Mixed hyperlipidemia: Secondary | ICD-10-CM

## 2020-09-06 ENCOUNTER — Encounter: Payer: Self-pay | Admitting: Internal Medicine

## 2020-09-10 ENCOUNTER — Other Ambulatory Visit: Payer: Self-pay

## 2020-09-10 ENCOUNTER — Encounter: Payer: Self-pay | Admitting: Podiatry

## 2020-09-10 ENCOUNTER — Ambulatory Visit (INDEPENDENT_AMBULATORY_CARE_PROVIDER_SITE_OTHER): Payer: Medicare Other | Admitting: Podiatry

## 2020-09-10 DIAGNOSIS — M79674 Pain in right toe(s): Secondary | ICD-10-CM | POA: Diagnosis not present

## 2020-09-10 DIAGNOSIS — L97511 Non-pressure chronic ulcer of other part of right foot limited to breakdown of skin: Secondary | ICD-10-CM

## 2020-09-10 DIAGNOSIS — E1142 Type 2 diabetes mellitus with diabetic polyneuropathy: Secondary | ICD-10-CM

## 2020-09-10 DIAGNOSIS — B351 Tinea unguium: Secondary | ICD-10-CM

## 2020-09-10 DIAGNOSIS — M79675 Pain in left toe(s): Secondary | ICD-10-CM | POA: Diagnosis not present

## 2020-09-10 NOTE — Progress Notes (Signed)
This patient returns to my office for at risk foot care.  This patient requires this care by a professional since this patient will be at risk due to having PAD, DVT, DM and CKD.  He presents to the office with his wife.  His ulcer third toe right foot has healed.  This patient is unable to cut nails himself since the patient cannot reach his nails.These nails are painful walking and wearing shoes.  This patient presents for at risk foot care today.  General Appearance  Alert, conversant and in no acute stress.  Vascular  Dorsalis pedis and posterior tibial  pulses are palpable  bilaterally.  Capillary return is within normal limits  bilaterally. Temperature is within normal limits  bilaterally.  Neurologic  Senn-Weinstein monofilament wire test within normal limits  bilaterally. Muscle power within normal limits bilaterally.  Nails Thick disfigured discolored nails with subungual debris  from hallux to fifth toes left foot   Long thick nails 2-5 right foot.. No evidence of bacterial infection or drainage bilaterally.  Orthopedic  No limitations of motion  feet .  No crepitus or effusions noted.  No bony pathology or digital deformities noted. Amputation right hallux.  Skin  normotropic skin with no porokeratosis noted bilaterally.  No signs of infections or ulcers noted.     Onychomycosis  Pain in right toes  Pain in left toes  Consent was obtained for treatment procedures.   Mechanical debridement of nails 1-5  bilaterally performed with a nail nipper.  Filed with dremel without incident. Once I determined his fourth toenail right foot was loose, I asked him about removing the nail.  He responded by saying it must still be attached since he  did not see it in his  sock.  Basicly he did not want me to touch the fourth toenail right foot.   Return office visit  3 months.                   Told patient to return for periodic foot care and evaluation due to potential at risk  complications.   Gardiner Barefoot DPM

## 2020-09-11 ENCOUNTER — Encounter: Payer: Medicare Other | Admitting: Internal Medicine

## 2020-09-19 ENCOUNTER — Encounter: Payer: Self-pay | Admitting: Internal Medicine

## 2020-09-19 ENCOUNTER — Ambulatory Visit (INDEPENDENT_AMBULATORY_CARE_PROVIDER_SITE_OTHER): Payer: Medicare Other | Admitting: Internal Medicine

## 2020-09-19 VITALS — BP 139/88 | HR 82 | Temp 98.2°F

## 2020-09-19 DIAGNOSIS — Z79899 Other long term (current) drug therapy: Secondary | ICD-10-CM

## 2020-09-19 DIAGNOSIS — R3915 Urgency of urination: Secondary | ICD-10-CM

## 2020-09-19 DIAGNOSIS — E785 Hyperlipidemia, unspecified: Secondary | ICD-10-CM

## 2020-09-19 DIAGNOSIS — E1129 Type 2 diabetes mellitus with other diabetic kidney complication: Secondary | ICD-10-CM

## 2020-09-19 DIAGNOSIS — R350 Frequency of micturition: Secondary | ICD-10-CM | POA: Insufficient documentation

## 2020-09-19 LAB — POCT GLYCOSYLATED HEMOGLOBIN (HGB A1C): Hemoglobin A1C: 6.7 % — AB (ref 4.0–5.6)

## 2020-09-19 LAB — GLUCOSE, CAPILLARY: Glucose-Capillary: 144 mg/dL — ABNORMAL HIGH (ref 70–99)

## 2020-09-19 MED ORDER — TAMSULOSIN HCL 0.4 MG PO CAPS: 0.4000 mg | ORAL_CAPSULE | Freq: Every day | ORAL | 1 refills | Status: DC

## 2020-09-19 NOTE — Assessment & Plan Note (Signed)
Patient taking atorvastatin 40 mg. No recent lipid panel in our system. No history of heart attack or stroke.  - Lipid Profile

## 2020-09-19 NOTE — Assessment & Plan Note (Signed)
Patient reports increased urinary frequency for a few months. He wakes up in the morning and needs to go to the restroom 5-6 times. He also has to go frequently through out the day. He completed I-PSS, scored 12 which suggest moderate symptoms. The scores are as follows ( Questions 1-7) 0,1,0,5,4,0,2, and he answers he is mostly dissatisfied with symptoms as they are now. I expect patient has overactive bladder or obstruction from his prostate. Prostate exam revealed large prostate with no nodularity. Bladder scan showed ~100 cc in bladder and supports possible overactive bladder. Will further evaluate prostate with PSA. Starting patient on flomax and gave patient return precautions if this is not relieving his symptoms. If PSA is elevated I will place referral for urology.

## 2020-09-19 NOTE — Patient Instructions (Addendum)
Thank you for trusting me with your care. To recap, today we discussed the following:  1. Type 2 diabetes mellitus with other diabetic kidney complication, without long-term current use of insulin (HCC)  - POC Hbg A1C  2. Hyperlipidemia, unspecified hyperlipidemia type  - Lipid Profile  3. Urinary urgency - bladder scan - BMP8+Anion Gap - PSA

## 2020-09-19 NOTE — Assessment & Plan Note (Signed)
DMII: Hgb A1c 5.1 % at the last visit. Current A1c 6.7%. The patient believes he is still taking Metformin and Jardiance. I will place order for clinical pharmacist to review patients medications with patient.  Plan: Continue Metformin 500 mg  Continue Jardiance 10 mg daily  Repeat A1c at next visit if after 3 months from this date

## 2020-09-19 NOTE — Progress Notes (Signed)
   CC: urinary frequency , screening for hyperlipidemia, screening for type 2 diabetes mellitus  HPI:[name] is a 70 y.o. male who presents for evaluation of urinary frequency , type 2 diabetes mellitus,and hyperlipidemia . Please see individual problem based A/P for details.  Past Medical History:  Diagnosis Date  . Anemia due to blood loss, acute 2014   2nd GIB  . Anxiety   . CKD (chronic kidney disease), stage III ([place]) 2014  . Diabetes mellitus without complication ([place])   . Gout   . High cholesterol   . Hypertension 2014  . Hypothyroid   . Neuropathy   . PUD (peptic ulcer disease) 2014   Review of Systems:   Review of Systems  Constitutional: Negative for chills and fever.  Genitourinary: Positive for frequency and urgency. Negative for dysuria, flank pain and hematuria.     Physical Exam: Vitals:   09/19/20 1055  BP: 139/88  Pulse: 82  Temp: 98.2 F (36.8 C)  TempSrc: Oral  SpO2: 100%   General: NAD, well kept  HEENT: Conjunctiva nl , antiicteric sclerae, moist mucous membranes, no exudate or erythema Cardiovascular: Normal rate, regular rhythm.  No murmurs, rubs, or gallops Pulmonary : Equal breath sounds, No wheezes, rales, or rhonchi Abdominal: soft, nontender,  bowel sounds present Ext: No edema in lower extremities, no tenderness to palpation of lower extremities.  Prostate: enlarged , no nodueles   Assessment & Plan:   See Encounters Tab for problem based charting.  Patient discussed with Dr. Evette Doffing

## 2020-09-20 LAB — BMP8+ANION GAP
Anion Gap: 21 mmol/L — ABNORMAL HIGH (ref 10.0–18.0)
BUN/Creatinine Ratio: 10 (ref 10–24)
BUN: 16 mg/dL (ref 8–27)
CO2: 19 mmol/L — ABNORMAL LOW (ref 20–29)
Calcium: 9.5 mg/dL (ref 8.6–10.2)
Chloride: 104 mmol/L (ref 96–106)
Creatinine, Ser: 1.64 mg/dL — ABNORMAL HIGH (ref 0.76–1.27)
Glucose: 146 mg/dL — ABNORMAL HIGH (ref 65–99)
Potassium: 3.4 mmol/L — ABNORMAL LOW (ref 3.5–5.2)
Sodium: 144 mmol/L (ref 134–144)
eGFR: 45 mL/min/{1.73_m2} — ABNORMAL LOW (ref >59)

## 2020-09-20 LAB — LIPID PANEL
Chol/HDL Ratio: 3.9 ratio (ref 0.0–5.0)
Cholesterol, Total: 147 mg/dL (ref 100–199)
HDL: 38 mg/dL — ABNORMAL LOW (ref >39)
LDL Chol Calc (NIH): 87 mg/dL (ref 0–99)
Triglycerides: 121 mg/dL (ref 0–149)
VLDL Cholesterol Cal: 22 mg/dL (ref 5–40)

## 2020-09-20 LAB — PSA: Prostate Specific Ag, Serum: 2.8 ng/mL (ref 0.0–4.0)

## 2020-09-20 NOTE — Progress Notes (Signed)
Internal Medicine Clinic Attending  I saw and evaluated the patient.  I personally confirmed the key portions of the history and exam documented by Dr. Steen and I reviewed pertinent patient test results.  The assessment, diagnosis, and plan were formulated together and I agree with the documentation in the resident's note.     

## 2020-09-24 ENCOUNTER — Encounter: Payer: Self-pay | Admitting: *Deleted

## 2020-09-27 NOTE — Progress Notes (Signed)
Called and reviewed lab work with patients wife. No further workup or changes needed at this time.

## 2020-10-05 ENCOUNTER — Ambulatory Visit: Payer: Medicare Other | Admitting: Cardiovascular Disease

## 2020-10-12 ENCOUNTER — Ambulatory Visit: Payer: Medicare Other | Admitting: Adult Health

## 2020-10-15 ENCOUNTER — Other Ambulatory Visit: Payer: Self-pay | Admitting: Internal Medicine

## 2020-10-15 DIAGNOSIS — I482 Chronic atrial fibrillation, unspecified: Secondary | ICD-10-CM

## 2020-10-18 ENCOUNTER — Other Ambulatory Visit: Payer: Self-pay

## 2020-10-18 ENCOUNTER — Ambulatory Visit (INDEPENDENT_AMBULATORY_CARE_PROVIDER_SITE_OTHER): Payer: Medicare Other | Admitting: Pharmacist

## 2020-10-18 DIAGNOSIS — Z79899 Other long term (current) drug therapy: Secondary | ICD-10-CM

## 2020-10-18 NOTE — Progress Notes (Signed)
   Subjective/Objective:    Patient ID: Jonathon [name], male    DOB: [birth year], 70 y.o.   MRN: [id number]  HPI Patient is a 70 y.o. male who presents for medication review and management.  He is in good spirits and presents with assistance in wheelchair with his wife present. Patient was referred and last seen by Primary Care Provider 09/19/20.  Medication Adherence Questionnaire (A score of 2 or more points indicates risk for nonadherence)  Do you know what each of your medicines is for? 1 (1 point if no)  Do you ever have trouble remembering to take your medicine? 0; wife gives them all to him (2 points if yes)  Do you ever not take a medicine because you feel you do not need it?  0 (1 point if yes)  Do you think that any of your medicines is not helping you? 0 (1 point if yes)  Do you have any physical problems such as vision loss that keep you from taking your medicines as prescribed?  2 (2 points if yes)  Do you think any of your medicine is causing a side effect?  0 (1 point if yes)  Do you know the names of ALL of your medicines? 1 (1 point if no)  Do you think that you need ALL of your medicines? 0 (1 point if no)  In the past 6 months, have you missed getting a refill or a new prescription filled on time?  0 (1 point if yes)  How often do you miss taking a dose of medicine?  0 Never (0 points), 1 or 2 times a month (1 points), 1 time a week (2 points), 2 or more times a week (3 points).   TOTAL SCORE 4/14   Assessment/Plan:   Understanding of regimen:  poor; patient's wife has good understanding   Understanding of indications:  poor; patient's wife has good understanding   Potential of compliance: good due to wife administering medications Patient has known adherence challenges based on score of 0 for questionnaire. Barriers include: lack of knowledge and his wife being responsible for supplying patient all of his medications which she mentions can be challenging. Discussed  with patient's wife importance of separating levothyroxine and taking Xarelto with food but she states "this is just not going to happen I give him all of his medications at bedtime." Attempted to set up [facility] with patient and his wife to have medications packaged and delivered to their house to ease burden. Patient's wife declined at this time after speaking with [facility] technician during appt. Provided patient and wife number if they decide to pursue this route. Patient was provided with a printed medication list.   Hypokalemia - patient is not currently taking potassium supplement and does not have any at home. Wife cannot recall last time he has taken this medication. Potassium from 09/19/20 was 3.4. Will send message to provider team with recommendation to continue potassium supplement and monitor.   Follow-up with PCP at next scheduled appt. Written patient instructions provided.  This appointment required 60 minutes of patient care (this includes precharting, chart review, review of results, and face-to-face care).  Thank you for involving pharmacy to assist in providing this patient's care.

## 2020-10-18 NOTE — Patient Instructions (Signed)
Mr. Recupero it was a pleasure seeing you today.   Today we reviewed all of the medications you are currently taking. Included is an updated medication list. Please continue taking all medications as prescribed on this list.  To help you remember to take your medications we talked about [facility]. Their phone number is [phone]  If you have any questions please call the clinic and ask to speak with me.  Follow-up with PCP at next scheduled appt.

## 2020-10-30 ENCOUNTER — Encounter: Payer: Self-pay | Admitting: *Deleted

## 2020-10-30 DIAGNOSIS — Z20822 Contact with and (suspected) exposure to covid-19: Secondary | ICD-10-CM | POA: Diagnosis not present

## 2020-11-01 ENCOUNTER — Ambulatory Visit (INDEPENDENT_AMBULATORY_CARE_PROVIDER_SITE_OTHER): Payer: Medicare Other | Admitting: Medical

## 2020-11-01 ENCOUNTER — Other Ambulatory Visit: Payer: Self-pay

## 2020-11-01 ENCOUNTER — Encounter: Payer: Self-pay | Admitting: Medical

## 2020-11-01 VITALS — BP 128/90 | HR 94 | Ht 72.0 in | Wt 234.8 lb

## 2020-11-01 DIAGNOSIS — I739 Peripheral vascular disease, unspecified: Secondary | ICD-10-CM | POA: Diagnosis not present

## 2020-11-01 DIAGNOSIS — E782 Mixed hyperlipidemia: Secondary | ICD-10-CM

## 2020-11-01 DIAGNOSIS — I10 Essential (primary) hypertension: Secondary | ICD-10-CM | POA: Diagnosis not present

## 2020-11-01 DIAGNOSIS — E119 Type 2 diabetes mellitus without complications: Secondary | ICD-10-CM | POA: Diagnosis not present

## 2020-11-01 DIAGNOSIS — I4819 Other persistent atrial fibrillation: Secondary | ICD-10-CM | POA: Diagnosis not present

## 2020-11-01 DIAGNOSIS — I5022 Chronic systolic (congestive) heart failure: Secondary | ICD-10-CM

## 2020-11-01 MED ORDER — ATORVASTATIN CALCIUM 80 MG PO TABS: 80.0000 mg | ORAL_TABLET | Freq: Every day | ORAL | 3 refills | Status: DC

## 2020-11-01 NOTE — Patient Instructions (Addendum)
Medication Instructions:  INCREASE Atorvastatin (Lipitor) to 80 mg daily  *If you need a refill on your cardiac medications before your next appointment, please call your pharmacy*  Lab Work: NONE ordered at this time of appointment   If you have labs (blood work) drawn today and your tests are completely normal, you will receive your results only by: [place] (if you have MyChart) OR A paper copy in the mail If you have any lab test that is abnormal or we need to change your treatment, we will call you to review the results.  Testing/Procedures: NONE ordered at this time of appointment   Follow-Up: At [facility], you and your health needs are our priority.  As part of our continuing mission to provide you with exceptional heart care, we have created designated Provider Care Teams.  These Care Teams include your primary Cardiologist (physician) and Advanced Practice Providers (APPs -  Physician Assistants and Nurse Practitioners) who all work together to provide you with the care you need, when you need it.  Your next appointment:   6 month(s)  The format for your next appointment:   In Person  Provider:   Eleonore Chiquito, MD  Other Instructions

## 2020-11-01 NOTE — Progress Notes (Signed)
Cardiology Office Note   Date:  11/01/2020   ID:  Rodriques Badie, DOB Feb 07, 1951, MRN [id number]  PCP:  Lajean Manes, MD  Cardiologist:  Evalina Field, MD EP: None  Chief Complaint  Patient presents with   Follow-up    Afib, CHF      History of Present Illness: Jonathon [name] is a 70 y.o. male with a PMH of persistent atrial fibrillation, chronic combined CHF, PAD s/p amputation of right great toe 06/2019, HTN, HLD, and DM type 2, who presents for routine follow-up.  He was last evaluated by cardiology at an outpatient visit with Dr. Audie Box 03/2020 at which time he was doing okay from a cardiac standpoint without anginal complaints of palpitations. He was recommended to repeat an echocardiogram to re-evaluate LV function following diagnosis of combined CHF 05/2019 in the setting of sepsis. He was recommended to follow-up in 6 months. Echocardiogram 05/2020 showed recovery of EF, now 50-55% (up from 35-40% 05/2019), global hypokinesis, mild LVH, mild biatrial enlargement, no significant valvular abnormalities.   He presents today for routine follow-up with his wife. He has been doing well over the past 7 months. He has chronic stable DOE but no SOB at rest. He denies chest pain, palpitations, dizziness, lightheadedness, syncope, LE edema, orthopnea, or PND. No complaints of bleeding on xarelto.   Past Medical History:  Diagnosis Date   Anemia due to blood loss, acute 2014   2nd GIB   Anxiety    CKD (chronic kidney disease), stage III ([employer]) 2014   Diabetes mellitus without complication ([place])    Gout    High cholesterol    Hypertension 2014   Hypothyroid    Neuropathy    PUD (peptic ulcer disease) 2014    Past Surgical History:  Procedure Laterality Date   ABDOMINAL AORTOGRAM W/LOWER EXTREMITY Bilateral 06/27/2019   Procedure: ABDOMINAL AORTOGRAM W/LOWER EXTREMITY;  Surgeon: Elam Dutch, MD;  Location: [place] [facility];  Service: Vascular;  Laterality: Bilateral;    ADRENALECTOMY     AMPUTATION Right 07/18/2019   Procedure: AMPUTATION RIGHT GREAT TOE;  Surgeon: [name], MD;  Location: [place];  Service: Vascular;  Laterality: Right;   EMBOLECTOMY Right 06/27/2019   Procedure: RIGHT LEG EMBOLECTOMY;  Surgeon: Elam Dutch, MD;  Location: [facility];  Service: Vascular;  Laterality: Right;     Current Outpatient Medications  Medication Sig Dispense Refill   aspirin EC 81 MG EC tablet Take 1 tablet (81 mg total) by mouth daily.     fenofibrate 54 MG tablet TAKE 1 TABLET(54 MG) BY MOUTH DAILY 90 tablet 1   JARDIANCE 10 MG TABS tablet TAKE 1 TABLET BY MOUTH EVERY DAY 90 tablet 1   levothyroxine (SYNTHROID) 112 MCG tablet TAKE 1 TABLET (112 MCG TOTAL) BY MOUTH AT BEDTIME. 90 tablet 0   losartan (COZAAR) 25 MG tablet TAKE 1/2 TABLET(12.5 MG) BY MOUTH DAILY 45 tablet 3   metoprolol succinate (TOPROL-XL) 50 MG 24 hr tablet TAKE 1 TABLET(50 MG) BY MOUTH DAILY WITH OR IMMEDIATELY FOLLOWING A MEAL 90 tablet 1   pantoprazole (PROTONIX) 40 MG tablet TAKE 1 TABLET BY MOUTH TWICE A DAY 180 tablet 1   PARoxetine (PAXIL) 40 MG tablet Take 1 tablet (40 mg total) by mouth at bedtime. 90 tablet 2   potassium chloride SA (KLOR-CON) 20 MEQ tablet Take 1 tablet by mouth daily.     pregabalin (LYRICA) 75 MG capsule Take 2 capsules (150 mg total) by  mouth at bedtime. 180 capsule 0   sildenafil (VIAGRA) 25 MG tablet Take 1 tablet (25 mg total) by mouth daily as needed for erectile dysfunction. 20 tablet 1   tamsulosin (FLOMAX) 0.4 MG CAPS capsule Take 1 capsule (0.4 mg total) by mouth daily. 90 capsule 1   temazepam (RESTORIL) 15 MG capsule Take 1 capsule (15 mg total) by mouth at bedtime. 30 capsule 5   XARELTO 20 MG TABS tablet TAKE 1 TABLET(20 MG) BY MOUTH DAILY 30 tablet 6   atorvastatin (LIPITOR) 80 MG tablet Take 1 tablet (80 mg total) by mouth at bedtime. 90 tablet 3   No current facility-administered medications for this visit.    Allergies:   Morphine and  related    Social History:  The patient  reports that he has quit smoking. His smoking use included cigarettes. He has a 6.00 pack-year smoking history. He quit smokeless tobacco use about 53 years ago. He reports current alcohol use of about 2.0 standard drinks of alcohol per week. He reports current drug use. Drug: Marijuana.   Family History:  The patient's  family history includes Diabetes Mellitus II in his maternal aunt; Lung cancer in his mother; Pancreatic cancer in his sister.    ROS:  Please see the history of present illness.   Otherwise, review of systems are positive for none.   All other systems are reviewed and negative.    PHYSICAL EXAM: VS:  BP 128/90 (BP Location: Right Arm, Patient Position: Sitting, Cuff Size: Normal)   Pulse 94   Ht 6' (1.829 m)   Wt 234 lb 12.8 oz (106.5 kg)   SpO2 97%   BMI 31.84 kg/m  , BMI Body mass index is 31.84 kg/m. GEN: Well nourished, well developed, in no acute distress HEENT: sclera anicteric Neck: no JVD, carotid bruits, or masses Cardiac: IRIR; no murmurs, rubs, or gallops, no edema  Respiratory:  clear to auscultation bilaterally, normal work of breathing GI: soft, nontender, nondistended, + BS MS: no deformity or atrophy Skin: warm and dry, no rash Neuro:  Strength and sensation are intact Psych: euthymic mood, full affect   EKG:  EKG is ordered today. The ekg ordered today demonstrates atrial fibrillation, rate 93 bpm, low voltage, no significant change from previous   Recent Labs: 09/19/2020: BUN 16; Creatinine, Ser 1.64; Potassium 3.4; Sodium 144    Lipid Panel    Component Value Date/Time   CHOL 147 09/19/2020 1150   TRIG 121 09/19/2020 1150   HDL 38 (L) 09/19/2020 1150   CHOLHDL 3.9 09/19/2020 1150   LDLCALC 87 09/19/2020 1150      Wt Readings from Last 3 Encounters:  11/01/20 234 lb 12.8 oz (106.5 kg)  04/06/20 231 lb (104.8 kg)  03/27/20 230 lb 14.4 oz (104.7 kg)      Other studies  Reviewed: Additional studies/ records that were reviewed today include:   Echocardiogram 05/2020: 1. Left ventricular ejection fraction, by estimation, is 50 to 55%. The  left ventricle has low normal function. The left ventricle demonstrates  global hypokinesis. There is mild left ventricular hypertrophy. Left  ventricular diastolic parameters are  indeterminate.   2. Right ventricular systolic function is normal. The right ventricular  size is normal. Tricuspid regurgitation signal is inadequate for assessing  PA pressure.   3. Left atrial size was mildly dilated.   4. Right atrial size was mildly dilated.   5. The mitral valve is normal in structure. No evidence of mitral valve  regurgitation. No evidence of mitral stenosis.   6. The aortic valve is tricuspid. Aortic valve regurgitation is not  visualized. No aortic stenosis is present.   7. Aortic dilatation noted. There is mild dilatation of the ascending  aorta, measuring 41 mm.   8. The inferior vena cava is normal in size with greater than 50%  respiratory variability, suggesting right atrial pressure of 3 mmHg.   9. The patient was in atrial fibrillation.     ASSESSMENT AND PLAN:   1. Persistent atrial fibrillation: EKG with atrial fibrillation with rate 93 bpm today. No complaints of palpitations. No complaints of bleeding - Continue metoprolol succinate for rate control - Continue xarelto for stroke ppx  2. Chronic combined CHF: with recent recovery of EF up to 50-55% 05/2020 from 35-40% 05/2019 in the setting of sepsis.  - Continue metoprolol succinate, losartan, and jardiance  3. PAD s/p amputation of right great toes 06/2019: no claudication complaints. Not on aspirin due to need for anticoagulation - Continue statin  4. HTN: BP 128/90 today - Continue metoprolol succinate and losartan  5. HLD: LDL 87 08/2020; goal <70 - Will increase atorvastatin to 80mg  daily  6. DM type 2: A1C 6.7; at goal of <7 - Continue  jardiance   Current medicines are reviewed at length with the patient today.  The patient does not have concerns regarding medicines.  The following changes have been made:  As above  Labs/ tests ordered today include:   Orders Placed This Encounter  Procedures   EKG 12-Lead     Disposition:   FU with Dr. Audie Box in 6 months  Signed, Abigail Butts, PA-C  11/01/2020 5:25 PM

## 2020-11-09 ENCOUNTER — Other Ambulatory Visit: Payer: Self-pay | Admitting: Student in an Organized Health Care Education/Training Program

## 2020-11-19 ENCOUNTER — Other Ambulatory Visit: Payer: Self-pay

## 2020-11-19 NOTE — Telephone Encounter (Signed)
Per patient wife the pharmacy is requesting another doctor to send in temazepam (RESTORIL) 15 MG capsule. Pt's wife would like a call back once this has been completed.

## 2020-11-20 MED ORDER — TEMAZEPAM 15 MG PO CAPS: 15.0000 mg | ORAL_CAPSULE | Freq: Every day | ORAL | 0 refills | Status: DC

## 2020-11-20 NOTE — Telephone Encounter (Signed)
Call from pt's wife about Temazepam rx; stated he has been out since Friday. She has called yesterday; reminded pt of 24 -48 hrs refill policy.

## 2020-11-20 NOTE — Telephone Encounter (Signed)
I attempted to call patient's wife to discuss yesterday evening, however, did not get an answer. Tried again, will refill medication for one month. Patient will need an appointment to be seen and discuss tapering off of the benzodiazepine and transitioning to different sleep aid.

## 2020-11-21 NOTE — Telephone Encounter (Signed)
Called pt;s wife - no answer; left message on self identified vm of temazepam refill and scheduling an appt for her husband.

## 2020-11-22 NOTE — Telephone Encounter (Signed)
Thank you both! Hopefully they return your call/messages.

## 2020-11-22 NOTE — Telephone Encounter (Signed)
Sent patient a message via my chart asking to please get in contact with the clinic to schedule future appointment.

## 2020-12-12 ENCOUNTER — Encounter: Payer: Self-pay | Admitting: Podiatry

## 2020-12-12 ENCOUNTER — Other Ambulatory Visit: Payer: Self-pay

## 2020-12-12 ENCOUNTER — Ambulatory Visit (INDEPENDENT_AMBULATORY_CARE_PROVIDER_SITE_OTHER): Payer: Medicare Other | Admitting: Podiatry

## 2020-12-12 DIAGNOSIS — N183 Chronic kidney disease, stage 3 unspecified: Secondary | ICD-10-CM

## 2020-12-12 DIAGNOSIS — E1142 Type 2 diabetes mellitus with diabetic polyneuropathy: Secondary | ICD-10-CM

## 2020-12-12 DIAGNOSIS — I739 Peripheral vascular disease, unspecified: Secondary | ICD-10-CM

## 2020-12-12 DIAGNOSIS — B351 Tinea unguium: Secondary | ICD-10-CM

## 2020-12-12 DIAGNOSIS — M79675 Pain in left toe(s): Secondary | ICD-10-CM | POA: Diagnosis not present

## 2020-12-12 DIAGNOSIS — M79674 Pain in right toe(s): Secondary | ICD-10-CM | POA: Diagnosis not present

## 2020-12-12 NOTE — Progress Notes (Signed)
This patient returns to my office for at risk foot care.  This patient requires this care by a professional since this patient will be at risk due to having PAD, DVT, DM and CKD.  He presents to the office with his wife.  This patient is unable to cut nails himself since the patient cannot reach his nails.These nails are painful walking and wearing shoes.  This patient presents for at risk foot care today.  General Appearance  Alert, conversant and in no acute stress.  Vascular  Dorsalis pedis and posterior tibial  pulses are palpable  bilaterally.  Capillary return is within normal limits  bilaterally. Temperature is within normal limits  bilaterally.  Neurologic  Senn-Weinstein monofilament wire test within normal limits  bilaterally. Muscle power within normal limits bilaterally.  Nails Thick disfigured discolored nails with subungual debris  from hallux to fifth toes left foot   Long thick nails 2-5 right foot.. No evidence of bacterial infection or drainage bilaterally.  Orthopedic  No limitations of motion  feet .  No crepitus or effusions noted.  No bony pathology or digital deformities noted. Amputation right hallux.  Skin  normotropic skin with no porokeratosis noted bilaterally.  No signs of infections or ulcers noted.     Onychomycosis  Pain in right toes  Pain in left toes  Consent was obtained for treatment procedures.   Mechanical debridement of nails 1-5  bilaterally performed with a nail nipper.  Filed with dremel without incident.    Return office visit  3 months.                   Told patient to return for periodic foot care and evaluation due to potential at risk complications.   Gardiner Barefoot DPM

## 2020-12-23 ENCOUNTER — Other Ambulatory Visit: Payer: Self-pay | Admitting: Student

## 2020-12-24 ENCOUNTER — Ambulatory Visit (INDEPENDENT_AMBULATORY_CARE_PROVIDER_SITE_OTHER): Payer: Medicare Other | Admitting: Student

## 2020-12-24 ENCOUNTER — Other Ambulatory Visit: Payer: Self-pay

## 2020-12-24 ENCOUNTER — Encounter: Payer: Self-pay | Admitting: Student

## 2020-12-24 DIAGNOSIS — E1121 Type 2 diabetes mellitus with diabetic nephropathy: Secondary | ICD-10-CM | POA: Diagnosis not present

## 2020-12-24 DIAGNOSIS — G4709 Other insomnia: Secondary | ICD-10-CM

## 2020-12-24 DIAGNOSIS — I482 Chronic atrial fibrillation, unspecified: Secondary | ICD-10-CM | POA: Diagnosis not present

## 2020-12-24 DIAGNOSIS — I10 Essential (primary) hypertension: Secondary | ICD-10-CM | POA: Diagnosis not present

## 2020-12-24 MED ORDER — TEMAZEPAM 15 MG PO CAPS: 15.0000 mg | ORAL_CAPSULE | Freq: Every day | ORAL | 0 refills | Status: DC

## 2020-12-24 NOTE — Assessment & Plan Note (Signed)
Last TSH check was 06/2019.  -Continue levothyroxine -Recheck TSH at next in-person visit

## 2020-12-24 NOTE — Assessment & Plan Note (Addendum)
Patient is here for refill of his temazepam.  He is taking 50 mg daily at bedtime.  States that he has been on this medication for 2-3 years.  States that this medication is working well.  PDMP reviewed.  Last refill 7/27  -Refill temazepam

## 2020-12-24 NOTE — Telephone Encounter (Signed)
Pt was informed (via mychart) that a visit is required to discuss future refills. CMA will send request to front office for an appt. Telehealth appt given for today with DrNguyen.Despina Hidden Cassady8/29/20222:28 PM

## 2020-12-24 NOTE — Assessment & Plan Note (Signed)
Blood pressure well controlled in the past.  Does not check his blood pressure at home  -Continue metoprolol and losartan

## 2020-12-24 NOTE — Progress Notes (Signed)
  [name] Telephone Encounter Continuity Care Appointment  HPI:  This telephone encounter was created for [name] on 12/24/2020 for the following purpose/cc insomnia and medication refill.   Past Medical History:  Past Medical History:  Diagnosis Date   Anemia due to blood loss, acute 2014   2nd GIB   Anxiety    CKD (chronic kidney disease), stage III ([place]) 2014   Diabetes mellitus without complication ([place])    Gout    High cholesterol    Hypertension 2014   Hypothyroid    Neuropathy    PUD (peptic ulcer disease) 2014     ROS:  Per HPI   Assessment / Plan / Recommendations:  Please see A&P under problem oriented charting for assessment of [name]'s acute and chronic medical conditions.  As always, pt is advised that if symptoms worsen or new symptoms arise, they should go to an urgent care facility or to to ER for further evaluation.   Consent and Medical Decision Making:  [name] discussed with Dr. Dareen Piano This is a telephone encounter between Jonathon [name] and Gaylan Gerold on 12/24/2020 for insomnia and medication refill. The visit was conducted with [name] located at home and Gaylan Gerold at [facility] [name]'s identity was confirmed using their DOB and current address. [name] has consented to being evaluated through a telephone encounter and understands the associated risks (an examination cannot be done and [name] may need to come in for an appointment) / benefits (allows [name] to remain at home, decreasing exposure to coronavirus). I personally spent 6 minutes on medical discussion.

## 2020-12-24 NOTE — Assessment & Plan Note (Signed)
Report adherence to Xarelto.  Denies symptoms of tachycardia or palpitation.  -Continue metoprolol

## 2020-12-24 NOTE — Assessment & Plan Note (Signed)
Last A1c of 5.1.  Metformin was discontinued due due to normal A1c and CKD.  Denies symptoms of hypoglycemia.  -Continue Jardiance

## 2020-12-30 ENCOUNTER — Other Ambulatory Visit: Payer: Self-pay | Admitting: Family Medicine

## 2021-01-10 ENCOUNTER — Other Ambulatory Visit: Payer: Self-pay | Admitting: Internal Medicine

## 2021-01-10 DIAGNOSIS — R3915 Urgency of urination: Secondary | ICD-10-CM

## 2021-01-10 NOTE — Progress Notes (Signed)
Internal Medicine Clinic Attending  Case discussed with Dr. Nguyen  At the time of the visit.  We reviewed the resident's history and exam and pertinent patient test results.  I agree with the assessment, diagnosis, and plan of care documented in the resident's note. 

## 2021-01-12 ENCOUNTER — Other Ambulatory Visit: Payer: Self-pay | Admitting: Family Medicine

## 2021-01-14 ENCOUNTER — Other Ambulatory Visit: Payer: Self-pay | Admitting: *Deleted

## 2021-01-14 MED ORDER — RIVAROXABAN 20 MG PO TABS: ORAL_TABLET | ORAL | 1 refills | Status: DC

## 2021-01-29 ENCOUNTER — Other Ambulatory Visit: Payer: Self-pay | Admitting: Student

## 2021-02-12 ENCOUNTER — Other Ambulatory Visit: Payer: Self-pay | Admitting: Student

## 2021-02-21 DIAGNOSIS — Z23 Encounter for immunization: Secondary | ICD-10-CM | POA: Diagnosis not present

## 2021-03-05 ENCOUNTER — Encounter: Payer: Self-pay | Admitting: *Deleted

## 2021-03-05 NOTE — Progress Notes (Unsigned)

## 2021-03-06 ENCOUNTER — Other Ambulatory Visit: Payer: Self-pay | Admitting: Internal Medicine

## 2021-03-06 DIAGNOSIS — I482 Chronic atrial fibrillation, unspecified: Secondary | ICD-10-CM

## 2021-03-06 DIAGNOSIS — G4709 Other insomnia: Secondary | ICD-10-CM

## 2021-03-08 ENCOUNTER — Telehealth: Payer: Self-pay | Admitting: Podiatry

## 2021-03-08 NOTE — Telephone Encounter (Signed)
Pts wife left message asking about how to clean the velcro area of his shoes.  I returned call and pts wife states she called [facility] and they told her to use a brush and dawn soap and it worked but thanks for calling her back.

## 2021-03-08 NOTE — Telephone Encounter (Signed)
During inpatient appointment patient will need recheck of his TSH level. PDMP appropriate for temazepam refill.

## 2021-03-14 ENCOUNTER — Other Ambulatory Visit: Payer: Self-pay

## 2021-03-14 ENCOUNTER — Ambulatory Visit (INDEPENDENT_AMBULATORY_CARE_PROVIDER_SITE_OTHER): Payer: Medicare Other | Admitting: Internal Medicine

## 2021-03-14 ENCOUNTER — Encounter: Payer: Self-pay | Admitting: Internal Medicine

## 2021-03-14 VITALS — BP 110/74 | HR 87 | Temp 97.8°F | Resp 28 | Ht 72.0 in | Wt 240.9 lb

## 2021-03-14 DIAGNOSIS — E785 Hyperlipidemia, unspecified: Secondary | ICD-10-CM | POA: Diagnosis not present

## 2021-03-14 DIAGNOSIS — E1129 Type 2 diabetes mellitus with other diabetic kidney complication: Secondary | ICD-10-CM | POA: Diagnosis not present

## 2021-03-14 DIAGNOSIS — E039 Hypothyroidism, unspecified: Secondary | ICD-10-CM | POA: Diagnosis not present

## 2021-03-14 DIAGNOSIS — N1831 Chronic kidney disease, stage 3a: Secondary | ICD-10-CM | POA: Diagnosis not present

## 2021-03-14 DIAGNOSIS — I129 Hypertensive chronic kidney disease with stage 1 through stage 4 chronic kidney disease, or unspecified chronic kidney disease: Secondary | ICD-10-CM

## 2021-03-14 DIAGNOSIS — E119 Type 2 diabetes mellitus without complications: Secondary | ICD-10-CM

## 2021-03-14 DIAGNOSIS — I482 Chronic atrial fibrillation, unspecified: Secondary | ICD-10-CM

## 2021-03-14 DIAGNOSIS — E1121 Type 2 diabetes mellitus with diabetic nephropathy: Secondary | ICD-10-CM

## 2021-03-14 DIAGNOSIS — Z0000 Encounter for general adult medical examination without abnormal findings: Secondary | ICD-10-CM

## 2021-03-14 DIAGNOSIS — G4700 Insomnia, unspecified: Secondary | ICD-10-CM | POA: Diagnosis not present

## 2021-03-14 DIAGNOSIS — I10 Essential (primary) hypertension: Secondary | ICD-10-CM

## 2021-03-14 LAB — POCT GLYCOSYLATED HEMOGLOBIN (HGB A1C): Hemoglobin A1C: 6.5 % — AB (ref 4.0–5.6)

## 2021-03-14 LAB — GLUCOSE, CAPILLARY: Glucose-Capillary: 172 mg/dL — ABNORMAL HIGH (ref 70–99)

## 2021-03-14 MED ORDER — TEMAZEPAM 7.5 MG PO CAPS: 7.5000 mg | ORAL_CAPSULE | Freq: Every evening | ORAL | 0 refills | Status: DC | PRN

## 2021-03-14 NOTE — Assessment & Plan Note (Addendum)
Last TSH 06/2019 of 0.96. Pt complaining of increased fatigue, stating he sleeps until 1-2 pm. He goes to bed at 1 am. This appears to be ongoing for quite some time. He denies N/V, weight changes, feeling hot or cold, and diarrhea or constipation. Counseled on prior sleep hygiene and consequences of delayed sleep schedule. Also educated on adverse effects of Temazepam, which he has been taking chronically for insomnia.   -Continue levothyroxine -F/u on TSH -Continue to improve sleep hygiene -Reduce tenazepam dose from 15 to 7.5  Addendum: TSH wnl 1.2. No medication adjustment. Re-assess pt's sxs during 1 mo f/u with tenazepam dose reduction.

## 2021-03-14 NOTE — Assessment & Plan Note (Addendum)
Today's Vitals   03/14/21 1503  BP: 110/74  Pulse: 87  Resp: (!) 28  Temp: 97.8 F (36.6 C)  TempSrc: Oral  SpO2: 100%  Weight: 240 lb 14.4 oz (109.3 kg)  Height: 6' (1.829 m)   BP at goal today on current regimen. Not monitoring BP level at home, educated on the importance of doing so, and to bring log during f/u visit. Reports good/poor medication compliance.Tolerating medication without adverse effects. Denies headaches, vision changes, lightheadedness, chest pain, SHOB, leg swelling or changes in speech. Exam benign and vitals otherwise wnl.  Pt continues to try to make lifestyle modifications; counseled on the importance of daily exercise, low salt diet, and weight loss.     Continue current regimen: metoprolol and losartan Encouraged to continue lifestyle modifications BP log and bring to next visit  F/u on BMP  Addendum: BMP with bump in Cr 1.6 -> 2.4 and decline in GFR 45 -> 29 since 6 mo ago. Consider repeating BMP during visit in 2-3 weeks.

## 2021-03-14 NOTE — Patient Instructions (Signed)
Thank you, [name] for allowing [place] to provide your care today!  Today we discussed:  Medications- continue to take all of your prescribed medications without any changes with the exception of tenazepam (take 7.5 instead of 15).   Fatigue and sleeping in late- We will check your thyroid level and see how it is working and see if this may be contributing, requiring any changes in your levothyroxine dose. Start taking tenazepam 7.5 instead of 15.   I will call you if any labs require any further attention or action.   I have ordered the following labs for you:  Lab Orders         TSH         Glucose, capillary         POC Hbg A1C      Medications ordered or changed:  Stop the following medications: There are no discontinued medications.   Start the following medications: No orders of the defined types were placed in this encounter.    Follow up in: 1 mo    Should you have any questions or concerns please call the internal medicine clinic at [phone].     Jonathon Manes, MD  Internal Medicine Resident, PGY-1 [name]

## 2021-03-14 NOTE — Assessment & Plan Note (Signed)
A1c 6.5 today, 6.7 five months ago. Improvingon current regimen. Reports good medication compliance. Tolerating medication without adverse effects. Continues to make lifestyle modifications; counseled on the benefits of daily exercise, limiting processed foods and high sugar foods, and weight loss. No hypoglycemic episodes. Denies polydipsia, polyuria, weight loss, lethargy, blurry vision, and changes in sensation. No wounds or ulcer of bilateral feet. Benign physical exam and vitals wnl.    Continue jardiance  Encouraged to continue lifestyle modifications Foot exam done

## 2021-03-14 NOTE — Assessment & Plan Note (Signed)
Flu shot offered, states he already got it Denies shingles vaccine Denies Hep C screening Denies colonoscopy referral  Denies ophthalmology referral for eye exam  Foot exam done

## 2021-03-14 NOTE — Assessment & Plan Note (Signed)
Denies symptoms of SHOB, CP, tachycardia or palpitation.  -Continue metoprolol -Continue Xarelto

## 2021-03-14 NOTE — Progress Notes (Signed)
CC: clinic f/u visit for chronic conditions   HPI:  [name] is a 70 y.o. male with a PMHx stated below and presents today for stated above. Please see the Encounters tab for problem-based Assessment & Plan for additional details.   Past Medical History:  Diagnosis Date   Anemia due to blood loss, acute 2014   2nd GIB   Anxiety    CKD (chronic kidney disease), stage III ([place]) 2014   Diabetes mellitus without complication (HCC)    Gout    High cholesterol    Hypertension 2014   Hypothyroid    Neuropathy    PUD (peptic ulcer disease) 2014    Current Outpatient Medications on File Prior to Visit  Medication Sig Dispense Refill   aspirin EC 81 MG EC tablet Take 1 tablet (81 mg total) by mouth daily.     atorvastatin (LIPITOR) 80 MG tablet Take 1 tablet (80 mg total) by mouth at bedtime. 90 tablet 3   fenofibrate 54 MG tablet TAKE 1 TABLET(54 MG) BY MOUTH DAILY 90 tablet 1   JARDIANCE 10 MG TABS tablet TAKE 1 TABLET BY MOUTH EVERY DAY 90 tablet 1   levothyroxine (SYNTHROID) 112 MCG tablet TAKE 1 TABLET (112 MCG TOTAL) BY MOUTH AT BEDTIME. 90 tablet 0   losartan (COZAAR) 25 MG tablet TAKE 1/2 TABLET(12.5 MG) BY MOUTH DAILY 45 tablet 3   metoprolol succinate (TOPROL-XL) 50 MG 24 hr tablet TAKE 1 TABLET(50 MG) BY MOUTH DAILY WITH OR IMMEDIATELY FOLLOWING A MEAL 90 tablet 1   pantoprazole (PROTONIX) 40 MG tablet TAKE 1 TABLET BY MOUTH TWICE A DAY 180 tablet 1   PARoxetine (PAXIL) 40 MG tablet TAKE 1 TABLET BY MOUTH EVERYDAY AT BEDTIME 90 tablet 2   potassium chloride SA (KLOR-CON) 20 MEQ tablet Take 1 tablet by mouth daily.     pregabalin (LYRICA) 75 MG capsule TAKE 2 CAPSULES (150 MG TOTAL) BY MOUTH AT BEDTIME. 180 capsule 0   rivaroxaban (XARELTO) 20 MG TABS tablet TAKE 1 TABLET BY MOUTH EVERY DAY 90 tablet 1   sildenafil (VIAGRA) 25 MG tablet TAKE 1 TABLET BY MOUTH DAILY AS NEEDED FOR ERECTILE DYSFUNCTION 20 tablet 1   tamsulosin (FLOMAX) 0.4 MG CAPS capsule TAKE 1  CAPSULE BY MOUTH EVERY DAY 90 capsule 1   temazepam (RESTORIL) 15 MG capsule TAKE 1 CAPSULE BY MOUTH EVERY DAY AT BEDTIME 30 capsule 0   No current facility-administered medications on file prior to visit.    Family History  Problem Relation Age of Onset   Diabetes Mellitus II Maternal Aunt    Lung cancer Mother    Pancreatic cancer Sister     Social History   Socioeconomic History   Marital status: Married    Spouse name: Not on file   Number of children: Not on file   Years of education: Not on file   Highest education level: Not on file  Occupational History   Occupation: banking    Comment: retired  Tobacco Use   Smoking status: Former    Packs/day: 2.00    Years: 3.00    Pack years: 6.00    Types: Cigarettes   Smokeless tobacco: Former    Quit date: 05/08/1967  Vaping Use   Vaping Use: Never used  Substance and Sexual Activity   Alcohol use: Yes    Alcohol/week: 2.0 standard drinks    Types: 2 Shots of liquor per week    Comment: daily   Drug use:  Yes    Types: Marijuana   Sexual activity: Not on file  Other Topics Concern   Not on file  Social History Narrative   Not on file   Social Determinants of Health   Financial Resource Strain: Not on file  Food Insecurity: Not on file  Transportation Needs: Not on file  Physical Activity: Not on file  Stress: Not on file  Social Connections: Not on file  Intimate Partner Violence: Not on file    Review of Systems: ROS negative except for what is noted on the assessment and plan.  Vitals:   03/14/21 1503  BP: 110/74  Pulse: 87  Resp: (!) 28  Temp: 97.8 F (36.6 C)  TempSrc: Oral  SpO2: 100%  Weight: 240 lb 14.4 oz (109.3 kg)  Height: 6' (1.829 m)     Physical Exam: Constitutional: alert, well-appearing, in NAD,A&O x 3 HENT: normocephalic, atraumatic, mucous membranes moist Eyes: conjunctiva non-erythematous, EOMI Cardiovascular: RRR, no m/r/g, non-edematous bilateral LE Pulmonary/Chest:  normal work of breathing on RA, LCTAB Abdominal: soft, non-tender to palpation, non-distended MSK: normal bulk and tone  Psych: normal behavior, normal affect    Assessment & Plan:   See Encounters Tab for problem based charting.  Patient seen with Dr. Lavonda Jumbo, MD  Internal Medicine Resident, PGY-1 [facility]

## 2021-03-14 NOTE — Assessment & Plan Note (Signed)
Has been taking temazepam 15 for 2-3 years. Pt complains of fatigue x several months. Takes medication at 9 pm and sleeps at around 1 am. States that this medication is working well, and reluctant to stop because he tried it one night and did not sleep at all that night. He is counseled on proper sleep hygiene and adverse effects of the medication in the elderly. He is agreeable to trying to fall asleep sooner at night, and to try temazepam 7.5 instead of 15.   -Start temazepam 7.5  -Stop temazepam 15  -Continue to improve sleep hygiene

## 2021-03-14 NOTE — Assessment & Plan Note (Signed)
LDL 87 5 mo ago. Reports excellent compliance to atorvastatin 80 without adverse effects. Counseled on the importance of continued lifestyle modifications, including: weight loss, daily exercise, and healthy diet with limited processed and fatty foods.   Continue atorvastatin  Encouraged to continue lifestyle modifications

## 2021-03-15 LAB — TSH: TSH: 1.21 u[IU]/mL (ref 0.450–4.500)

## 2021-03-15 NOTE — Progress Notes (Signed)
Internal Medicine Clinic Attending  I saw and evaluated the patient.  I personally confirmed the key portions of the history and exam documented by Dr. Patel and I reviewed pertinent patient test results.  The assessment, diagnosis, and plan were formulated together and I agree with the documentation in the resident's note.  

## 2021-03-15 NOTE — Addendum Note (Signed)
Addended byLajean Manes on: 03/15/2021 09:13 AM   Modules accepted: Orders

## 2021-03-19 ENCOUNTER — Encounter: Payer: Medicare Other | Admitting: Internal Medicine

## 2021-03-20 ENCOUNTER — Ambulatory Visit (INDEPENDENT_AMBULATORY_CARE_PROVIDER_SITE_OTHER): Payer: Self-pay | Admitting: Podiatry

## 2021-03-20 DIAGNOSIS — M79674 Pain in right toe(s): Secondary | ICD-10-CM

## 2021-03-20 DIAGNOSIS — M79675 Pain in left toe(s): Secondary | ICD-10-CM

## 2021-03-20 DIAGNOSIS — E1142 Type 2 diabetes mellitus with diabetic polyneuropathy: Secondary | ICD-10-CM

## 2021-03-20 DIAGNOSIS — B351 Tinea unguium: Secondary | ICD-10-CM

## 2021-03-20 DIAGNOSIS — I739 Peripheral vascular disease, unspecified: Secondary | ICD-10-CM

## 2021-03-20 LAB — BMP8+ANION GAP
BUN/Creatinine Ratio: 11 (ref 10–24)
BUN: 26 mg/dL (ref 8–27)
CO2: 23 mmol/L (ref 20–29)
Calcium: 10.7 mg/dL — ABNORMAL HIGH (ref 8.6–10.2)
Creatinine, Ser: 2.32 mg/dL — ABNORMAL HIGH (ref 0.76–1.27)
Glucose: 229 mg/dL — ABNORMAL HIGH (ref 70–99)
Potassium: 4.9 mmol/L (ref 3.5–5.2)
eGFR: 29 mL/min/{1.73_m2} — ABNORMAL LOW (ref >59)

## 2021-03-20 LAB — SPECIMEN STATUS REPORT

## 2021-04-04 NOTE — Progress Notes (Signed)
No show  Gardiner Barefoot DPM

## 2021-04-14 ENCOUNTER — Other Ambulatory Visit: Payer: Self-pay | Admitting: Student

## 2021-04-14 ENCOUNTER — Other Ambulatory Visit: Payer: Self-pay | Admitting: Internal Medicine

## 2021-04-14 DIAGNOSIS — G4700 Insomnia, unspecified: Secondary | ICD-10-CM

## 2021-04-15 ENCOUNTER — Ambulatory Visit (INDEPENDENT_AMBULATORY_CARE_PROVIDER_SITE_OTHER): Payer: Medicare Other | Admitting: Internal Medicine

## 2021-04-15 VITALS — BP 125/78 | HR 68 | Temp 97.7°F | Ht 72.0 in | Wt 243.6 lb

## 2021-04-15 DIAGNOSIS — E785 Hyperlipidemia, unspecified: Secondary | ICD-10-CM

## 2021-04-15 DIAGNOSIS — I482 Chronic atrial fibrillation, unspecified: Secondary | ICD-10-CM | POA: Diagnosis not present

## 2021-04-15 DIAGNOSIS — G4700 Insomnia, unspecified: Secondary | ICD-10-CM

## 2021-04-15 DIAGNOSIS — E039 Hypothyroidism, unspecified: Secondary | ICD-10-CM

## 2021-04-15 DIAGNOSIS — E1121 Type 2 diabetes mellitus with diabetic nephropathy: Secondary | ICD-10-CM | POA: Diagnosis not present

## 2021-04-15 DIAGNOSIS — I10 Essential (primary) hypertension: Secondary | ICD-10-CM

## 2021-04-15 MED ORDER — TEMAZEPAM 7.5 MG PO CAPS: 7.5000 mg | ORAL_CAPSULE | Freq: Every evening | ORAL | 0 refills | Status: DC | PRN

## 2021-04-15 NOTE — Patient Instructions (Signed)
It was nice meeting you!   I have refilled your medication   Continue taking your medications as directed

## 2021-04-15 NOTE — Assessment & Plan Note (Addendum)
Patient currently on atorvastatin 80 mg daily and fenofibrate 54 mg daily.  Lipid profile from May 2022 revealed total cholesterol levels, triglecerides and LDL within normal limits. Reconsider fenofibrate use?    Plan: Continue current management; defer to PCP for reconsideration of fenofibrate??

## 2021-04-15 NOTE — Assessment & Plan Note (Signed)
Current medication: Metoprolol XL 50 mg and losartan 12.5 mg daily.  Patient is compliant with his medications.  Blood pressure currently at goal.  Recent BMP (03/14/2021) showed elevated creatinine levels and a decrease in eGFR.  However, the lab was noted that deterioration occurred during specimen handling.  Will repeat BMP.  Plan: Continue current management: metoprolol XL 50 mg losartan 12.5 from vitals daily BMP obtained today; follow-up results

## 2021-04-15 NOTE — Assessment & Plan Note (Signed)
Recent A1c 6.5%; patient currently on Jardiance.  Patient is compliant with medication.  Denies any adverse effects to the medication.  Foot exam up-to-date.   Plan: Patient due for diabetic eye exam on subsequent exam Continue Jardiance

## 2021-04-15 NOTE — Progress Notes (Signed)
CC: Repeat BMP; BP check, medication refill  HPI:  [name] is a 70 y.o. male with a past medical history stated below and presents today for cc listed above. Please see problem based assessment and plan for additional details.  Past Medical History:  Diagnosis Date   Anemia due to blood loss, acute 2014   2nd GIB   Anxiety    CKD (chronic kidney disease), stage III ([place]) 2014   Diabetes mellitus without complication (HCC)    Gout    High cholesterol    Hypertension 2014   Hypothyroid    Neuropathy    PUD (peptic ulcer disease) 2014    Current Outpatient Medications on File Prior to Visit  Medication Sig Dispense Refill   aspirin EC 81 MG EC tablet Take 1 tablet (81 mg total) by mouth daily.     atorvastatin (LIPITOR) 80 MG tablet Take 1 tablet (80 mg total) by mouth at bedtime. 90 tablet 3   fenofibrate 54 MG tablet TAKE 1 TABLET(54 MG) BY MOUTH DAILY 90 tablet 1   JARDIANCE 10 MG TABS tablet TAKE 1 TABLET BY MOUTH EVERY DAY 90 tablet 1   levothyroxine (SYNTHROID) 112 MCG tablet TAKE 1 TABLET (112 MCG TOTAL) BY MOUTH AT BEDTIME. 90 tablet 0   losartan (COZAAR) 25 MG tablet TAKE 1/2 TABLET(12.5 MG) BY MOUTH DAILY 45 tablet 3   metoprolol succinate (TOPROL-XL) 50 MG 24 hr tablet TAKE 1 TABLET(50 MG) BY MOUTH DAILY WITH OR IMMEDIATELY FOLLOWING A MEAL 90 tablet 1   pantoprazole (PROTONIX) 40 MG tablet TAKE 1 TABLET BY MOUTH TWICE A DAY 180 tablet 1   PARoxetine (PAXIL) 40 MG tablet TAKE 1 TABLET BY MOUTH EVERYDAY AT BEDTIME 90 tablet 2   potassium chloride SA (KLOR-CON) 20 MEQ tablet Take 1 tablet by mouth daily.     pregabalin (LYRICA) 75 MG capsule TAKE 2 CAPSULES (150 MG TOTAL) BY MOUTH AT BEDTIME. 180 capsule 0   rivaroxaban (XARELTO) 20 MG TABS tablet TAKE 1 TABLET BY MOUTH EVERY DAY 90 tablet 1   sildenafil (VIAGRA) 25 MG tablet TAKE 1 TABLET BY MOUTH DAILY AS NEEDED FOR ERECTILE DYSFUNCTION 20 tablet 1   tamsulosin (FLOMAX) 0.4 MG CAPS capsule TAKE 1 CAPSULE  BY MOUTH EVERY DAY 90 capsule 1   No current facility-administered medications on file prior to visit.    Family History  Problem Relation Age of Onset   Diabetes Mellitus II Maternal Aunt    Lung cancer Mother    Pancreatic cancer Sister     Social History   Socioeconomic History   Marital status: Married    Spouse name: Not on file   Number of children: Not on file   Years of education: Not on file   Highest education level: Not on file  Occupational History   Occupation: banking    Comment: retired  Tobacco Use   Smoking status: Former    Packs/day: 2.00    Years: 3.00    Pack years: 6.00    Types: Cigarettes   Smokeless tobacco: Former    Quit date: 05/08/1967  Vaping Use   Vaping Use: Never used  Substance and Sexual Activity   Alcohol use: Yes    Alcohol/week: 2.0 standard drinks    Types: 2 Shots of liquor per week    Comment: daily   Drug use: Yes    Types: Marijuana   Sexual activity: Not on file  Other Topics Concern   Not on  file  Social History Narrative   Not on file   Social Determinants of Health   Financial Resource Strain: Not on file  Food Insecurity: Not on file  Transportation Needs: Not on file  Physical Activity: Not on file  Stress: Not on file  Social Connections: Not on file  Intimate Partner Violence: Not on file    Review of Systems: ROS negative except for what is noted on the assessment and plan.  Vitals:   04/15/21 1355  BP: 125/78  Pulse: 68  Temp: 97.7 F (36.5 C)  TempSrc: Oral  SpO2: 100%  Weight: 243 lb 9.6 oz (110.5 kg)  Height: 6' (1.829 m)     Physical Exam: Constitutional: well-appearing gentleman sitting in wheelchair, in no acute distress HENT: normocephalic atraumatic, mucous membranes moist Eyes: conjunctiva non-erythematous Neck: supple Cardiovascular: irregularly irregular rate and rhythm, no m/r/g Pulmonary/Chest: normal work of breathing on room air, lungs clear to auscultation  bilaterally Abdominal: soft, non-tender, non-distended MSK: normal bulk and tone Neurological: alert & oriented x 3, 5/5 strength in bilateral upper and lower extremities, normal gait Skin: warm and dry Psych: normal mood; normal behavior   Assessment & Plan:   See Encounters Tab for problem based charting.  Patient seen with Dr. Jadene Pierini, M.D. [facility], PGY-1 Pager: [phone], Phone: [phone] Date 04/15/2021 Time 4:56 PM

## 2021-04-15 NOTE — Assessment & Plan Note (Signed)
Recent TSH check was within normal limits at 1.2.  Current medication include levothyroxine 112mcg daily.  Patient is compliant with medications.  Patient denies any symptoms at this time.   Plan: Continue Levothyroxine 145mcg

## 2021-04-15 NOTE — Assessment & Plan Note (Addendum)
Current management: metoprolol XL 50 mg daily and Xarelto 20 mg daily.  Patient is compliant with medications.  Patient denies chest pain or palpitations.   Plan: Continue metoprolol XL 50 mg daily and Xarelto 20 mg daily.

## 2021-04-15 NOTE — Assessment & Plan Note (Signed)
Prior office visit, temazepam dose was decreased from 15 mg to 7.5 mg.  Patient states he is tolerating well.  Patient needs refill.    Plan: Refilled temazepam 7.5 mg

## 2021-04-16 LAB — BMP8+ANION GAP
Anion Gap: 14 mmol/L (ref 10.0–18.0)
BUN/Creatinine Ratio: 11 (ref 10–24)
BUN: 19 mg/dL (ref 8–27)
CO2: 25 mmol/L (ref 20–29)
Calcium: 9.5 mg/dL (ref 8.6–10.2)
Chloride: 103 mmol/L (ref 96–106)
Creatinine, Ser: 1.79 mg/dL — ABNORMAL HIGH (ref 0.76–1.27)
Glucose: 164 mg/dL — ABNORMAL HIGH (ref 70–99)
Potassium: 4.2 mmol/L (ref 3.5–5.2)
Sodium: 142 mmol/L (ref 134–144)
eGFR: 40 mL/min/{1.73_m2} — ABNORMAL LOW (ref >59)

## 2021-04-16 NOTE — Progress Notes (Signed)
Internal Medicine Clinic Attending  I saw and evaluated the patient.  I personally confirmed the key portions of the history and exam documented by Dr. Ariwodo and I reviewed pertinent patient test results.  The assessment, diagnosis, and plan were formulated together and I agree with the documentation in the resident's note.   

## 2021-05-19 ENCOUNTER — Other Ambulatory Visit: Payer: Self-pay | Admitting: Student

## 2021-05-19 DIAGNOSIS — G4709 Other insomnia: Secondary | ICD-10-CM

## 2021-05-21 ENCOUNTER — Other Ambulatory Visit: Payer: Self-pay

## 2021-05-22 MED ORDER — PREGABALIN 75 MG PO CAPS: 150.0000 mg | ORAL_CAPSULE | Freq: Every day | ORAL | 0 refills | Status: DC

## 2021-06-04 ENCOUNTER — Telehealth: Payer: Self-pay

## 2021-06-04 NOTE — Telephone Encounter (Signed)
Return call to pt's wife. She wants to know if the doctor could change Restoril to Trazodone? They have been having trouble getting restoril and he has tried trazodone before. Informed pt' wife pt needs an appt to discuss with the doctor. Agree to telehealth appt - appt schedule w/Dr Aslam tomorrow 2/8/ @ 9381 PM.

## 2021-06-04 NOTE — Telephone Encounter (Signed)
Thank you :)

## 2021-06-04 NOTE — Telephone Encounter (Signed)
Pt's wife requesting to speak with a nurse about temazepam (RESTORIL) 7.5 MG capsule. Want to stop taking this medication, requesting another medication. Please call back.

## 2021-06-05 ENCOUNTER — Ambulatory Visit (INDEPENDENT_AMBULATORY_CARE_PROVIDER_SITE_OTHER): Payer: Medicare Other | Admitting: Internal Medicine

## 2021-06-05 DIAGNOSIS — G4700 Insomnia, unspecified: Secondary | ICD-10-CM

## 2021-06-05 NOTE — Progress Notes (Signed)
Attempted to call patient x3. Unable to reach at this time. Left HIPAA compliant voicemail.

## 2021-07-03 ENCOUNTER — Ambulatory Visit (INDEPENDENT_AMBULATORY_CARE_PROVIDER_SITE_OTHER): Payer: Medicare Other | Admitting: Internal Medicine

## 2021-07-03 VITALS — BP 96/59 | HR 70 | Temp 98.2°F | Ht 72.0 in | Wt 248.5 lb

## 2021-07-03 DIAGNOSIS — I10 Essential (primary) hypertension: Secondary | ICD-10-CM

## 2021-07-03 DIAGNOSIS — G4700 Insomnia, unspecified: Secondary | ICD-10-CM

## 2021-07-03 DIAGNOSIS — Z1211 Encounter for screening for malignant neoplasm of colon: Secondary | ICD-10-CM | POA: Diagnosis not present

## 2021-07-03 DIAGNOSIS — E1121 Type 2 diabetes mellitus with diabetic nephropathy: Secondary | ICD-10-CM

## 2021-07-03 MED ORDER — TRAZODONE HCL 50 MG PO TABS: 50.0000 mg | ORAL_TABLET | Freq: Every day | ORAL | 2 refills | Status: DC

## 2021-07-03 NOTE — Assessment & Plan Note (Addendum)
BP 96/59 and repeat unchanged. Pt is asymptomatic, except for complaints of lightheadedness when standing from a seated position. No recent change in medications. Excellent PO intake; eating and drinking fine. He is taking Losartan 25 mg daily and metoprolol suc 50 mg daily, and he is compliant with these with no adverse effects. He is not measuring BP at home; counseled on doing so, and he is agreeable. Denies headaches, vision changes, lightheadedness, chest pain, SHOB, leg swelling or changes in speech.  ? ?-Discontinue Losartan  ?-Continue metoprolol 50 mg daily, but can consider dose reduction during next visit oh hypotension persists ?-Consider discontinuing Flomax if this persists ?-BP log and bring to next visit  ? ?

## 2021-07-03 NOTE — Progress Notes (Signed)
Internal Medicine Clinic Attending  Case discussed with Dr. Patel  At the time of the visit.  We reviewed the resident's history and exam and pertinent patient test results.  I agree with the assessment, diagnosis, and plan of care documented in the resident's note.  

## 2021-07-03 NOTE — Patient Instructions (Signed)
Stop taking the losartan  ?Continue taking the Metoprolol  ?Start taking Trazodone  ?

## 2021-07-03 NOTE — Assessment & Plan Note (Addendum)
Previously on temazepam 7.5 mg for insomnia that was affecting his QOL. He reports being off of it for weeks due to cost. Reports using wife's trazodone 50 mg instead, and that it helps with insomnia. He is requesting a prescription for trazodone-- think this is appropriate.  ? ?Discontinued temazepam 7.5 ?Started Tranzodone 50 g daily  ?

## 2021-07-03 NOTE — Assessment & Plan Note (Addendum)
A1c 6.5 three months ago. Taking Jardiance 10 mg daily. No adverse effects and reports to be doing well. Foot exam done.  ? ?Continue Jardiance  ?Eye exam referral placed  ?

## 2021-07-03 NOTE — Progress Notes (Signed)
? ?CC: routine clinic follow up  ? ?HPI: ? ?[name] is a 71 y.o. male with a PMHx stated below and presents today for stated above. Please see the Encounters tab for problem-based Assessment & Plan for additional details.  ? ?Past Medical History:  ?Diagnosis Date  ? Anemia due to blood loss, acute 2014  ? 2nd GIB  ? Anxiety   ? CKD (chronic kidney disease), stage III ([place]) 2014  ? Diabetes mellitus without complication ([place])   ? Gout   ? High cholesterol   ? Hypertension 2014  ? Hypothyroid   ? Neuropathy   ? PUD (peptic ulcer disease) 2014  ? ? ?Current Outpatient Medications on File Prior to Visit  ?Medication Sig Dispense Refill  ? aspirin EC 81 MG EC tablet Take 1 tablet (81 mg total) by mouth daily.    ? atorvastatin (LIPITOR) 80 MG tablet Take 1 tablet (80 mg total) by mouth at bedtime. 90 tablet 3  ? fenofibrate 54 MG tablet TAKE 1 TABLET(54 MG) BY MOUTH DAILY 90 tablet 1  ? JARDIANCE 10 MG TABS tablet TAKE 1 TABLET BY MOUTH EVERY DAY 90 tablet 1  ? levothyroxine (SYNTHROID) 112 MCG tablet TAKE 1 TABLET (112 MCG TOTAL) BY MOUTH AT BEDTIME. 90 tablet 0  ? losartan (COZAAR) 25 MG tablet TAKE 1/2 TABLET(12.5 MG) BY MOUTH DAILY 45 tablet 3  ? metoprolol succinate (TOPROL-XL) 50 MG 24 hr tablet TAKE 1 TABLET(50 MG) BY MOUTH DAILY WITH OR IMMEDIATELY FOLLOWING A MEAL 90 tablet 1  ? pantoprazole (PROTONIX) 40 MG tablet TAKE 1 TABLET BY MOUTH TWICE A DAY 180 tablet 1  ? PARoxetine (PAXIL) 40 MG tablet TAKE 1 TABLET BY MOUTH EVERYDAY AT BEDTIME 90 tablet 2  ? pregabalin (LYRICA) 75 MG capsule Take 2 capsules (150 mg total) by mouth at bedtime. 180 capsule 0  ? rivaroxaban (XARELTO) 20 MG TABS tablet TAKE 1 TABLET BY MOUTH EVERY DAY 90 tablet 1  ? sildenafil (VIAGRA) 25 MG tablet TAKE 1 TABLET BY MOUTH DAILY AS NEEDED FOR ERECTILE DYSFUNCTION 20 tablet 1  ? tamsulosin (FLOMAX) 0.4 MG CAPS capsule TAKE 1 CAPSULE BY MOUTH EVERY DAY 90 capsule 1  ? temazepam (RESTORIL) 7.5 MG capsule Take 1 capsule (7.5 mg  total) by mouth at bedtime as needed for sleep. 90 capsule 0  ? ?No current facility-administered medications on file prior to visit.  ? ? ?Family History  ?Problem Relation Age of Onset  ? Diabetes Mellitus II Maternal Aunt   ? Lung cancer Mother   ? Pancreatic cancer Sister   ? ? ?Social History  ? ?Socioeconomic History  ? Marital status: Married  ?  Spouse name: Not on file  ? Number of children: Not on file  ? Years of education: Not on file  ? Highest education level: Not on file  ?Occupational History  ? 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 Smoking status: Former  ?  Packs/day: 2.00  ?  Years: 3.00  ?  Pack years: 6.00  ?  Types: Cigarettes  ? Smokeless tobacco: Former  ?  Quit date: 05/08/1967  ?Vaping Use  ? Vaping Use: Never used  ?Substance and Sexual Activity  ? Alcohol use: Yes  ?  Alcohol/week: 2.0 standard drinks  ?  Types: 2 Shots of liquor per week  ?  Comment: daily  ? Drug use: Yes  ?  Types: Marijuana  ? Sexual activity: Not on file  ?  Other Topics Concern  ? Not on file  ?Social History Narrative  ? Not on file  ? ?Social Determinants of Health  ? ?Financial Resource Strain: Not on file  ?Food Insecurity: Not on file  ?Transportation Needs: Not on file  ?Physical Activity: Not on file  ?Stress: Not on file  ?Social Connections: Not on file  ?Intimate Partner Violence: Not on file  ? ? ?Review of Systems: ?ROS negative except for what is noted on the assessment and plan. ? ?Vitals:  ? 07/03/21 1343  ?BP: (!) 96/59  ?Pulse: 70  ?Temp: 98.2 ?F (36.8 ?C)  ?TempSrc: Oral  ?SpO2: 100%  ?Weight: 248 lb 8 oz (112.7 kg)  ?Height: 6' (1.829 m)  ? ? ? ?Physical Exam: ?Constitutional: alert, well-appearing, in NAD ?HENT: normocephalic, atraumatic, mucous membranes moist ?Eyes: conjunctiva non-erythematous, EOMI ?Cardiovascular: RRR, no m/r/g, non-edematous bilateral LE ?Pulmonary/Chest: normal work of breathing on RA, LCTAB ?Abdominal: soft, non-tender to palpation, non-distended ?MSK:  normal bulk and tone  ?Neurological: A&O x 3 and follows commands  ?Psych: normal behavior, normal affect  ? ?Assessment & Plan:  ? ?See Encounters Tab for problem based charting. ? ?Patient discussed with Dr. Cain Sieve ? ?Lajean Manes, MD  ?Internal Medicine Resident, PGY-1 ?[facility]  ?

## 2021-07-11 ENCOUNTER — Other Ambulatory Visit: Payer: Self-pay | Admitting: Internal Medicine

## 2021-07-18 DIAGNOSIS — Z20822 Contact with and (suspected) exposure to covid-19: Secondary | ICD-10-CM | POA: Diagnosis not present

## 2021-08-01 ENCOUNTER — Encounter: Payer: Self-pay | Admitting: Student

## 2021-08-01 ENCOUNTER — Ambulatory Visit (INDEPENDENT_AMBULATORY_CARE_PROVIDER_SITE_OTHER): Payer: Medicare Other | Admitting: Student

## 2021-08-01 ENCOUNTER — Other Ambulatory Visit: Payer: Self-pay

## 2021-08-01 VITALS — BP 106/73 | HR 88 | Temp 97.8°F | Ht 72.0 in | Wt 242.5 lb

## 2021-08-01 DIAGNOSIS — I10 Essential (primary) hypertension: Secondary | ICD-10-CM | POA: Diagnosis not present

## 2021-08-01 DIAGNOSIS — E039 Hypothyroidism, unspecified: Secondary | ICD-10-CM

## 2021-08-01 DIAGNOSIS — F329 Major depressive disorder, single episode, unspecified: Secondary | ICD-10-CM | POA: Diagnosis not present

## 2021-08-01 DIAGNOSIS — F321 Major depressive disorder, single episode, moderate: Secondary | ICD-10-CM | POA: Diagnosis not present

## 2021-08-01 DIAGNOSIS — R5381 Other malaise: Secondary | ICD-10-CM | POA: Diagnosis not present

## 2021-08-01 DIAGNOSIS — G4700 Insomnia, unspecified: Secondary | ICD-10-CM | POA: Diagnosis not present

## 2021-08-01 MED ORDER — METOPROLOL SUCCINATE ER 25 MG PO TB24: 25.0000 mg | ORAL_TABLET | Freq: Every day | ORAL | 11 refills | Status: DC

## 2021-08-01 MED ORDER — SERTRALINE HCL 100 MG PO TABS: 100.0000 mg | ORAL_TABLET | Freq: Every day | ORAL | 11 refills | Status: DC

## 2021-08-01 NOTE — Assessment & Plan Note (Signed)
Blood pressure 106/73.  Currently taking metoprolol succinate 50 mg daily.  His wife said that he is has been eating more. ? ?-Will cut back metoprolol to succinate to 25 mg daily. ?

## 2021-08-01 NOTE — Patient Instructions (Addendum)
Mr. Oestreicher, ? ?It was a pleasure seeing you in the clinic today. ? ?For your depression, I will stop Paxil and switch to Zoloft.  I placed an order for physical therapy to help get you moving and more active. ? ?I will also check thyroid number today.  It would be best if he can take your levothyroxine first thing in the morning on an empty stomach. ? ?Please return in 3 months, sooner if needed ? ?Dr. Alfonse Spruce ?

## 2021-08-01 NOTE — Assessment & Plan Note (Signed)
His wife voiced concern that patient has been more depressed and withdrawn.  He is sleeping more (up to 16 hours a day) and communicate less with her.  She said that his appetite is actually getting better.  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  He does have a toe block for shoe.  He is taking Paxil and his wife that is not working. ? ?PHQ-9 score of 14, indicating moderate depression.  Patient denies suicidal homicidal thoughts. ? ?-Will stop Paxil and start Zoloft 100 mg daily.  Reassess to respond in 6-8 weeks ?-Stop trazodone given excessive sleep ?-Referral to physical therapy  ?

## 2021-08-01 NOTE — Assessment & Plan Note (Signed)
Currently taking levothyroxine 112 mcg daily.  He takes it 2 to 3 hours after his dinner. ? ?-Will recheck TSH to make sure hypothyroid does not contribute to his depressive symptoms ?-Advised patient to take levothyroxine first thing in the morning on an empty stomach ?

## 2021-08-01 NOTE — Assessment & Plan Note (Signed)
Stop trazodone given excessive sleep ?

## 2021-08-01 NOTE — Progress Notes (Signed)
? ?  CC: Follow-up on depression ? ?HPI: ? ?[name] is a 71 y.o. with past medical history of hypertension, A-fib, DVT who presents to the clinic with his wife for follow-up on depression. ? ?Please see problem based charting for detail ? ?Past Medical History:  ?Diagnosis Date  ? Anemia due to blood loss, acute 2014  ? 2nd GIB  ? Anxiety   ? CKD (chronic kidney disease), stage III ([place]) 2014  ? Diabetes mellitus without complication ([place])   ? Gout   ? High cholesterol   ? Hypertension 2014  ? Hypothyroid   ? Neuropathy   ? PUD (peptic ulcer disease) 2014  ? ?Review of Systems:  per HPI ? ?Physical Exam: ? ?Vitals:  ? 08/01/21 1436  ?BP: 106/73  ?Pulse: 88  ?Temp: 97.8 ?F (36.6 ?C)  ?TempSrc: Oral  ?SpO2: 100%  ?Weight: 242 lb 8 oz (110 kg)  ?Height: 6' (1.829 m)  ? ?Physical Exam ?Constitutional:   ?   General: He is not in acute distress. ?HENT:  ?   Head: Normocephalic.  ?Eyes:  ?   General:     ?   Right eye: No discharge.     ?   Left eye: No discharge.  ?   Conjunctiva/sclera: Conjunctivae normal.  ?Cardiovascular:  ?   Rate and Rhythm: Normal rate and regular rhythm.  ?Pulmonary:  ?   Effort: Pulmonary effort is normal.  ?   Breath sounds: Normal breath sounds.  ?Musculoskeletal:     ?   General: Normal range of motion.  ?Skin: ?   General: Skin is warm.  ?Neurological:  ?   Mental Status: He is alert.  ?Psychiatric:     ?   Mood and Affect: Mood normal.  ?  ? ?Assessment & Plan:  ? ?See Encounters Tab for problem based charting. ? ?Patient discussed with Dr. Jimmye Norman  ?

## 2021-08-02 LAB — TSH: TSH: 0.744 u[IU]/mL (ref 0.450–4.500)

## 2021-08-06 NOTE — Progress Notes (Signed)
Internal Medicine Clinic Attending  Case discussed with Dr. Nguyen  At the time of the visit.  We reviewed the resident's history and exam and pertinent patient test results.  I agree with the assessment, diagnosis, and plan of care documented in the resident's note. 

## 2021-08-07 ENCOUNTER — Encounter: Payer: Self-pay | Admitting: Podiatry

## 2021-08-07 ENCOUNTER — Ambulatory Visit: Payer: Medicare Other

## 2021-08-07 ENCOUNTER — Ambulatory Visit (INDEPENDENT_AMBULATORY_CARE_PROVIDER_SITE_OTHER): Payer: Medicare Other | Admitting: Podiatry

## 2021-08-07 DIAGNOSIS — I739 Peripheral vascular disease, unspecified: Secondary | ICD-10-CM

## 2021-08-07 DIAGNOSIS — N183 Chronic kidney disease, stage 3 unspecified: Secondary | ICD-10-CM | POA: Diagnosis not present

## 2021-08-07 DIAGNOSIS — M79674 Pain in right toe(s): Secondary | ICD-10-CM | POA: Diagnosis not present

## 2021-08-07 DIAGNOSIS — M2041 Other hammer toe(s) (acquired), right foot: Secondary | ICD-10-CM

## 2021-08-07 DIAGNOSIS — M79675 Pain in left toe(s): Secondary | ICD-10-CM

## 2021-08-07 DIAGNOSIS — B351 Tinea unguium: Secondary | ICD-10-CM

## 2021-08-07 DIAGNOSIS — E1142 Type 2 diabetes mellitus with diabetic polyneuropathy: Secondary | ICD-10-CM

## 2021-08-07 DIAGNOSIS — S98131D Complete traumatic amputation of one right lesser toe, subsequent encounter: Secondary | ICD-10-CM

## 2021-08-07 NOTE — Progress Notes (Signed)
SITUATION ?Reason for Consult: Evaluation for Prefabricated Diabetic Shoes and Custom Diabetic Inserts. ?Patient / Caregiver Report: Patient would like well fitting shoes ? ?OBJECTIVE DATA: ?Patient History / Diagnosis:  ?  ICD-10-CM   ?1. Diabetic peripheral neuropathy associated with type 2 diabetes mellitus ([place])  E11.42   ?  ?2. Hammertoe, bilateral  M20.41   ? M20.42   ?  ? ? ?Physician Treating Diabetes:  Glenford Bayley, MD ? ?Current or Previous Devices:   Current user ? ?In-Person Foot Examination: ?Ulcers & Callousing:   Historical ?Deformities:    Hammertoes ?Sensation:    Compromised  ?Shoe Size:     11.5W ? ?ORTHOTIC RECOMMENDATION ?Recommended Devices: ?- 1x pair prefabricated PDAC approved diabetic shoes; Patient Selected Apex [id number] Size 11.5W ?- 3x pair custom-to-patient PDAC approved vacuum formed diabetic insoles. ? ?GOALS OF SHOES AND INSOLES ?- Reduce shear and pressure ?- Reduce / Prevent callus formation ?- Reduce / Prevent ulceration ?- Protect the fragile healing compromised diabetic foot. ? ?Patient would benefit from diabetic shoes and inserts as patient has diabetes mellitus and the patient has one or more of the following conditions: ?- History of partial or complete amputation of the foot ?- History of previous foot ulceration. ?- History of pre-ulcerative callus ?- Peripheral neuropathy with evidence of callus formation ?- Foot deformity ?- Poor circulation ? ?ACTIONS PERFORMED ?Potential out of pocket cost was communicated to patient. Patient understood and consented to measurement and casting. Patient was casted for insoles via crush box and measured for shoes via brannock device. Procedure was explained and patient tolerated procedure well. All questions were answered and concerns addressed. Casts were shipped to central fabrication for HOLD until Certificate of Medical Necessity or otherwise necessary authorization from insurance is obtained. ? ?PLAN ?Shoes are to be ordered and casts  released from hold once all appropriate paperwork is complete. Patient is to be contacted and scheduled for fitting once shoes and insoles have been fabricated and received. ? ?

## 2021-08-07 NOTE — Progress Notes (Addendum)
This patient returns to my office for at risk foot care.  This patient requires this care by a professional since this patient will be at risk due to having PAD, DVT, DM and CKD.  He presents to the office with his wife.  This patient is unable to cut nails himself since the patient cannot reach his nails.These nails are painful walking and wearing shoes.  This patient presents for at risk foot care today. ? ?General Appearance  Alert, conversant and in no acute stress. ? ?Vascular  Dorsalis pedis and posterior tibial  pulses are palpable  bilaterally.  Capillary return is within normal limits  bilaterally. Temperature is within normal limits  bilaterally. ? ?Neurologic  Senn-Weinstein monofilament wire test within normal limits  bilaterally. Muscle power within normal limits bilaterally. ? ?Nails Thick disfigured discolored nails with subungual debris  from hallux to fifth toes left foot   Long thick nails 2-5 right foot.. No evidence of bacterial infection or drainage bilaterally. ? ?Orthopedic  No limitations of motion  feet .  No crepitus or effusions noted.  No bony pathology or digital deformities noted. Amputation right hallux. ? ?Skin  normotropic skin with no porokeratosis noted bilaterally.  No signs of infections or ulcers noted.    ? ?Onychomycosis  Pain in right toes  Pain in left toes ? ?Consent was obtained for treatment procedures.   Mechanical debridement of nails 1-5  bilaterally performed with a nail nipper.  Filed with dremel without incident. 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 ? ? ?Return office visit  3 months.                   Told patient to return for periodic foot care and evaluation due to potential at risk complications. ? ? ?Gardiner Barefoot DPM  ?

## 2021-08-12 DIAGNOSIS — S98139A Complete traumatic amputation of one unspecified lesser toe, initial encounter: Secondary | ICD-10-CM | POA: Insufficient documentation

## 2021-08-12 NOTE — Addendum Note (Signed)
Addended by: Gardiner Barefoot on: 08/12/2021 12:25 PM ? ? Modules accepted: Orders ? ?

## 2021-08-14 DIAGNOSIS — Z20822 Contact with and (suspected) exposure to covid-19: Secondary | ICD-10-CM | POA: Diagnosis not present

## 2021-08-19 DIAGNOSIS — R262 Difficulty in walking, not elsewhere classified: Secondary | ICD-10-CM | POA: Diagnosis not present

## 2021-08-24 ENCOUNTER — Other Ambulatory Visit: Payer: Self-pay | Admitting: Student

## 2021-08-24 DIAGNOSIS — F321 Major depressive disorder, single episode, moderate: Secondary | ICD-10-CM

## 2021-08-26 DIAGNOSIS — R262 Difficulty in walking, not elsewhere classified: Secondary | ICD-10-CM | POA: Diagnosis not present

## 2021-08-28 DIAGNOSIS — R262 Difficulty in walking, not elsewhere classified: Secondary | ICD-10-CM | POA: Diagnosis not present

## 2021-08-30 DIAGNOSIS — Z20822 Contact with and (suspected) exposure to covid-19: Secondary | ICD-10-CM | POA: Diagnosis not present

## 2021-09-02 DIAGNOSIS — H25813 Combined forms of age-related cataract, bilateral: Secondary | ICD-10-CM | POA: Diagnosis not present

## 2021-09-02 DIAGNOSIS — Z7984 Long term (current) use of oral hypoglycemic drugs: Secondary | ICD-10-CM | POA: Diagnosis not present

## 2021-09-02 DIAGNOSIS — R262 Difficulty in walking, not elsewhere classified: Secondary | ICD-10-CM | POA: Diagnosis not present

## 2021-09-02 DIAGNOSIS — E119 Type 2 diabetes mellitus without complications: Secondary | ICD-10-CM | POA: Diagnosis not present

## 2021-09-02 LAB — HM DIABETES EYE EXAM

## 2021-09-04 ENCOUNTER — Other Ambulatory Visit: Payer: Self-pay | Admitting: Student

## 2021-09-05 ENCOUNTER — Other Ambulatory Visit: Payer: Self-pay

## 2021-09-05 ENCOUNTER — Ambulatory Visit (INDEPENDENT_AMBULATORY_CARE_PROVIDER_SITE_OTHER): Payer: Medicare Other | Admitting: Internal Medicine

## 2021-09-05 ENCOUNTER — Encounter: Payer: Self-pay | Admitting: Internal Medicine

## 2021-09-05 VITALS — BP 97/63 | HR 92 | Temp 98.1°F | Resp 28 | Ht 72.0 in | Wt 247.6 lb

## 2021-09-05 DIAGNOSIS — R5383 Other fatigue: Secondary | ICD-10-CM | POA: Diagnosis not present

## 2021-09-05 DIAGNOSIS — Z7901 Long term (current) use of anticoagulants: Secondary | ICD-10-CM

## 2021-09-05 DIAGNOSIS — E1121 Type 2 diabetes mellitus with diabetic nephropathy: Secondary | ICD-10-CM

## 2021-09-05 DIAGNOSIS — I951 Orthostatic hypotension: Secondary | ICD-10-CM

## 2021-09-05 LAB — POCT GLYCOSYLATED HEMOGLOBIN (HGB A1C): Hemoglobin A1C: 6.5 % — AB (ref 4.0–5.6)

## 2021-09-05 LAB — GLUCOSE, CAPILLARY: Glucose-Capillary: 185 mg/dL — ABNORMAL HIGH (ref 70–99)

## 2021-09-05 NOTE — Telephone Encounter (Signed)
Pt has an appt today with Dr Darrick Meigs. ?

## 2021-09-05 NOTE — Patient Instructions (Signed)
[name], it was a pleasure seeing you today! ? ?Today we discussed: ? ?Low blood pressure:  ?-Stop tamsulosin ?-Return next week for re-evaluation ? ? ? ?Follow-up:  1 week   ? ?Please make sure to arrive 15 minutes prior to your next appointment. If you arrive late, you may be asked to reschedule.  ? ?We look forward to seeing you next time. Please call our clinic at [phone] if you have any questions or concerns. The best time to call is Monday-Friday from 9am-4pm, but there is someone available 24/7. If after hours or the weekend, call the main hospital number and ask for the Internal Medicine Resident On-Call. If you need medication refills, please notify your pharmacy one week in advance and they will send [place] a request. ? ?Thank you for letting us take part in your care. Wishing you the best! ? ? ?

## 2021-09-06 ENCOUNTER — Encounter (HOSPITAL_COMMUNITY): Payer: Self-pay

## 2021-09-06 ENCOUNTER — Encounter: Payer: Self-pay | Admitting: Internal Medicine

## 2021-09-06 ENCOUNTER — Telehealth: Payer: Self-pay | Admitting: Student

## 2021-09-06 ENCOUNTER — Inpatient Hospital Stay (HOSPITAL_COMMUNITY)
Admission: EM | Admit: 2021-09-06 | Discharge: 2021-09-10 | DRG: 378 | Disposition: A | Discharge status: Home / Self Care | Payer: Medicare Other | Source: Ambulatory Visit | Attending: Internal Medicine | Admitting: Internal Medicine

## 2021-09-06 ENCOUNTER — Other Ambulatory Visit: Payer: Self-pay

## 2021-09-06 ENCOUNTER — Telehealth: Payer: Self-pay | Admitting: Student in an Organized Health Care Education/Training Program

## 2021-09-06 DIAGNOSIS — K921 Melena: Secondary | ICD-10-CM | POA: Diagnosis not present

## 2021-09-06 DIAGNOSIS — D500 Iron deficiency anemia secondary to blood loss (chronic): Secondary | ICD-10-CM | POA: Diagnosis not present

## 2021-09-06 DIAGNOSIS — Z87891 Personal history of nicotine dependence: Secondary | ICD-10-CM

## 2021-09-06 DIAGNOSIS — D122 Benign neoplasm of ascending colon: Secondary | ICD-10-CM

## 2021-09-06 DIAGNOSIS — I70201 Unspecified atherosclerosis of native arteries of extremities, right leg: Secondary | ICD-10-CM | POA: Diagnosis present

## 2021-09-06 DIAGNOSIS — K2970 Gastritis, unspecified, without bleeding: Secondary | ICD-10-CM | POA: Diagnosis not present

## 2021-09-06 DIAGNOSIS — K922 Gastrointestinal hemorrhage, unspecified: Secondary | ICD-10-CM

## 2021-09-06 DIAGNOSIS — E164 Increased secretion of gastrin: Secondary | ICD-10-CM | POA: Diagnosis not present

## 2021-09-06 DIAGNOSIS — R531 Weakness: Secondary | ICD-10-CM | POA: Insufficient documentation

## 2021-09-06 DIAGNOSIS — N183 Chronic kidney disease, stage 3 unspecified: Secondary | ICD-10-CM | POA: Diagnosis present

## 2021-09-06 DIAGNOSIS — D120 Benign neoplasm of cecum: Secondary | ICD-10-CM | POA: Diagnosis not present

## 2021-09-06 DIAGNOSIS — D123 Benign neoplasm of transverse colon: Secondary | ICD-10-CM | POA: Diagnosis not present

## 2021-09-06 DIAGNOSIS — I509 Heart failure, unspecified: Secondary | ICD-10-CM

## 2021-09-06 DIAGNOSIS — R195 Other fecal abnormalities: Secondary | ICD-10-CM | POA: Diagnosis not present

## 2021-09-06 DIAGNOSIS — I5022 Chronic systolic (congestive) heart failure: Secondary | ICD-10-CM | POA: Diagnosis present

## 2021-09-06 DIAGNOSIS — K222 Esophageal obstruction: Secondary | ICD-10-CM | POA: Diagnosis present

## 2021-09-06 DIAGNOSIS — Z801 Family history of malignant neoplasm of trachea, bronchus and lung: Secondary | ICD-10-CM

## 2021-09-06 DIAGNOSIS — I4891 Unspecified atrial fibrillation: Secondary | ICD-10-CM

## 2021-09-06 DIAGNOSIS — E1122 Type 2 diabetes mellitus with diabetic chronic kidney disease: Secondary | ICD-10-CM | POA: Diagnosis present

## 2021-09-06 DIAGNOSIS — F419 Anxiety disorder, unspecified: Secondary | ICD-10-CM | POA: Diagnosis present

## 2021-09-06 DIAGNOSIS — D649 Anemia, unspecified: Secondary | ICD-10-CM | POA: Diagnosis not present

## 2021-09-06 DIAGNOSIS — R600 Localized edema: Secondary | ICD-10-CM

## 2021-09-06 DIAGNOSIS — K621 Rectal polyp: Secondary | ICD-10-CM | POA: Diagnosis present

## 2021-09-06 DIAGNOSIS — D631 Anemia in chronic kidney disease: Secondary | ICD-10-CM | POA: Diagnosis present

## 2021-09-06 DIAGNOSIS — D696 Thrombocytopenia, unspecified: Secondary | ICD-10-CM | POA: Diagnosis present

## 2021-09-06 DIAGNOSIS — E1165 Type 2 diabetes mellitus with hyperglycemia: Secondary | ICD-10-CM | POA: Diagnosis present

## 2021-09-06 DIAGNOSIS — Z7901 Long term (current) use of anticoagulants: Secondary | ICD-10-CM

## 2021-09-06 DIAGNOSIS — K2990 Gastroduodenitis, unspecified, without bleeding: Secondary | ICD-10-CM | POA: Diagnosis present

## 2021-09-06 DIAGNOSIS — Z89411 Acquired absence of right great toe: Secondary | ICD-10-CM

## 2021-09-06 DIAGNOSIS — E1142 Type 2 diabetes mellitus with diabetic polyneuropathy: Secondary | ICD-10-CM | POA: Diagnosis not present

## 2021-09-06 DIAGNOSIS — E1129 Type 2 diabetes mellitus with other diabetic kidney complication: Secondary | ICD-10-CM | POA: Diagnosis present

## 2021-09-06 DIAGNOSIS — K7689 Other specified diseases of liver: Secondary | ICD-10-CM | POA: Diagnosis present

## 2021-09-06 DIAGNOSIS — K2971 Gastritis, unspecified, with bleeding: Principal | ICD-10-CM | POA: Diagnosis present

## 2021-09-06 DIAGNOSIS — D62 Acute posthemorrhagic anemia: Secondary | ICD-10-CM | POA: Diagnosis present

## 2021-09-06 DIAGNOSIS — Z79899 Other long term (current) drug therapy: Secondary | ICD-10-CM

## 2021-09-06 DIAGNOSIS — D509 Iron deficiency anemia, unspecified: Secondary | ICD-10-CM

## 2021-09-06 DIAGNOSIS — N1832 Chronic kidney disease, stage 3b: Secondary | ICD-10-CM | POA: Diagnosis present

## 2021-09-06 DIAGNOSIS — E1151 Type 2 diabetes mellitus with diabetic peripheral angiopathy without gangrene: Secondary | ICD-10-CM | POA: Diagnosis present

## 2021-09-06 DIAGNOSIS — F32A Depression, unspecified: Secondary | ICD-10-CM | POA: Diagnosis present

## 2021-09-06 DIAGNOSIS — R0609 Other forms of dyspnea: Secondary | ICD-10-CM | POA: Diagnosis present

## 2021-09-06 DIAGNOSIS — K5140 Inflammatory polyps of colon without complications: Secondary | ICD-10-CM | POA: Diagnosis not present

## 2021-09-06 DIAGNOSIS — I482 Chronic atrial fibrillation, unspecified: Secondary | ICD-10-CM | POA: Diagnosis present

## 2021-09-06 DIAGNOSIS — K317 Polyp of stomach and duodenum: Secondary | ICD-10-CM | POA: Diagnosis not present

## 2021-09-06 DIAGNOSIS — R161 Splenomegaly, not elsewhere classified: Secondary | ICD-10-CM | POA: Diagnosis present

## 2021-09-06 DIAGNOSIS — R5383 Other fatigue: Secondary | ICD-10-CM | POA: Insufficient documentation

## 2021-09-06 DIAGNOSIS — K635 Polyp of colon: Secondary | ICD-10-CM

## 2021-09-06 DIAGNOSIS — Z79890 Hormone replacement therapy (postmenopausal): Secondary | ICD-10-CM

## 2021-09-06 DIAGNOSIS — I951 Orthostatic hypotension: Secondary | ICD-10-CM | POA: Diagnosis present

## 2021-09-06 DIAGNOSIS — D125 Benign neoplasm of sigmoid colon: Secondary | ICD-10-CM | POA: Diagnosis not present

## 2021-09-06 DIAGNOSIS — E039 Hypothyroidism, unspecified: Secondary | ICD-10-CM | POA: Diagnosis present

## 2021-09-06 DIAGNOSIS — Z86718 Personal history of other venous thrombosis and embolism: Secondary | ICD-10-CM

## 2021-09-06 DIAGNOSIS — M109 Gout, unspecified: Secondary | ICD-10-CM | POA: Diagnosis present

## 2021-09-06 DIAGNOSIS — R609 Edema, unspecified: Secondary | ICD-10-CM

## 2021-09-06 DIAGNOSIS — I110 Hypertensive heart disease with heart failure: Secondary | ICD-10-CM | POA: Diagnosis not present

## 2021-09-06 DIAGNOSIS — E785 Hyperlipidemia, unspecified: Secondary | ICD-10-CM | POA: Diagnosis present

## 2021-09-06 DIAGNOSIS — K633 Ulcer of intestine: Secondary | ICD-10-CM | POA: Diagnosis not present

## 2021-09-06 DIAGNOSIS — I10 Essential (primary) hypertension: Secondary | ICD-10-CM | POA: Diagnosis present

## 2021-09-06 DIAGNOSIS — R791 Abnormal coagulation profile: Secondary | ICD-10-CM | POA: Diagnosis present

## 2021-09-06 DIAGNOSIS — Z833 Family history of diabetes mellitus: Secondary | ICD-10-CM

## 2021-09-06 DIAGNOSIS — N179 Acute kidney failure, unspecified: Secondary | ICD-10-CM | POA: Diagnosis present

## 2021-09-06 DIAGNOSIS — E7800 Pure hypercholesterolemia, unspecified: Secondary | ICD-10-CM | POA: Diagnosis present

## 2021-09-06 DIAGNOSIS — R739 Hyperglycemia, unspecified: Secondary | ICD-10-CM

## 2021-09-06 DIAGNOSIS — Z8711 Personal history of peptic ulcer disease: Secondary | ICD-10-CM

## 2021-09-06 DIAGNOSIS — E119 Type 2 diabetes mellitus without complications: Secondary | ICD-10-CM | POA: Diagnosis not present

## 2021-09-06 DIAGNOSIS — K219 Gastro-esophageal reflux disease without esophagitis: Secondary | ICD-10-CM | POA: Diagnosis present

## 2021-09-06 DIAGNOSIS — E876 Hypokalemia: Secondary | ICD-10-CM | POA: Diagnosis not present

## 2021-09-06 DIAGNOSIS — Z7984 Long term (current) use of oral hypoglycemic drugs: Secondary | ICD-10-CM | POA: Diagnosis not present

## 2021-09-06 DIAGNOSIS — I130 Hypertensive heart and chronic kidney disease with heart failure and stage 1 through stage 4 chronic kidney disease, or unspecified chronic kidney disease: Secondary | ICD-10-CM | POA: Diagnosis present

## 2021-09-06 DIAGNOSIS — K5730 Diverticulosis of large intestine without perforation or abscess without bleeding: Secondary | ICD-10-CM

## 2021-09-06 DIAGNOSIS — Z7982 Long term (current) use of aspirin: Secondary | ICD-10-CM

## 2021-09-06 DIAGNOSIS — I4811 Longstanding persistent atrial fibrillation: Secondary | ICD-10-CM | POA: Diagnosis not present

## 2021-09-06 DIAGNOSIS — K319 Disease of stomach and duodenum, unspecified: Secondary | ICD-10-CM | POA: Diagnosis not present

## 2021-09-06 DIAGNOSIS — Z800 Family history of malignant neoplasm of digestive organs: Secondary | ICD-10-CM

## 2021-09-06 LAB — COMPREHENSIVE METABOLIC PANEL
ALT: 9 U/L (ref 0–44)
AST: 15 U/L (ref 15–41)
Albumin: 3.4 g/dL — ABNORMAL LOW (ref 3.5–5.0)
Alkaline Phosphatase: 29 U/L — ABNORMAL LOW (ref 38–126)
Anion gap: 6 (ref 5–15)
BUN: 25 mg/dL — ABNORMAL HIGH (ref 8–23)
CO2: 24 mmol/L (ref 22–32)
Calcium: 9.3 mg/dL (ref 8.9–10.3)
Chloride: 108 mmol/L (ref 98–111)
Creatinine, Ser: 2.4 mg/dL — ABNORMAL HIGH (ref 0.61–1.24)
GFR, Estimated: 28 mL/min — ABNORMAL LOW (ref >60)
Glucose, Bld: 250 mg/dL — ABNORMAL HIGH (ref 70–99)
Potassium: 3.8 mmol/L (ref 3.5–5.1)
Sodium: 138 mmol/L (ref 135–145)
Total Bilirubin: 1 mg/dL (ref 0.3–1.2)
Total Protein: 6.1 g/dL — ABNORMAL LOW (ref 6.5–8.1)

## 2021-09-06 LAB — CBC
HCT: 16.5 % — ABNORMAL LOW (ref 39.0–52.0)
Hematocrit: 18.9 % — ABNORMAL LOW (ref 37.5–51.0)
Hemoglobin: 4.5 g/dL — CL (ref 13.0–17.7)
Hemoglobin: 4.1 g/dL — CL (ref 13.0–17.0)
MCH: 14.3 pg — ABNORMAL LOW (ref 26.6–33.0)
MCH: 14.7 pg — ABNORMAL LOW (ref 26.0–34.0)
MCHC: 23.8 g/dL — CL (ref 31.5–35.7)
MCHC: 24.8 g/dL — ABNORMAL LOW (ref 30.0–36.0)
MCV: 60 fL — ABNORMAL LOW (ref 79–97)
MCV: 59.1 fL — ABNORMAL LOW (ref 80.0–100.0)
NRBC: 1 % — ABNORMAL HIGH (ref 0–0)
Platelets: 162 10*3/uL (ref 150–450)
Platelets: UNDETERMINED 10*3/uL (ref 150–400)
RBC: 3.15 x10E6/uL — ABNORMAL LOW (ref 4.14–5.80)
RBC: 2.79 MIL/uL — ABNORMAL LOW (ref 4.22–5.81)
RDW: 20.8 % — ABNORMAL HIGH (ref 11.6–15.4)
RDW: 23 % — ABNORMAL HIGH (ref 11.5–15.5)
WBC: 5 10*3/uL (ref 3.4–10.8)
WBC: 4.6 10*3/uL (ref 4.0–10.5)
nRBC: 1.3 % — ABNORMAL HIGH (ref 0.0–0.2)

## 2021-09-06 LAB — CMP14 + ANION GAP
ALT: 6 IU/L (ref 0–44)
AST: 12 IU/L (ref 0–40)
Albumin/Globulin Ratio: 2 (ref 1.2–2.2)
Albumin: 4.3 g/dL (ref 3.7–4.7)
Alkaline Phosphatase: 37 IU/L — ABNORMAL LOW (ref 44–121)
Anion Gap: 16 mmol/L (ref 10.0–18.0)
BUN/Creatinine Ratio: 11 (ref 10–24)
BUN: 23 mg/dL (ref 8–27)
Bilirubin Total: 0.9 mg/dL (ref 0.0–1.2)
CO2: 20 mmol/L (ref 20–29)
Calcium: 9.3 mg/dL (ref 8.6–10.2)
Chloride: 104 mmol/L (ref 96–106)
Creatinine, Ser: 2.1 mg/dL — ABNORMAL HIGH (ref 0.76–1.27)
Globulin, Total: 2.2 g/dL (ref 1.5–4.5)
Glucose: 192 mg/dL — ABNORMAL HIGH (ref 70–99)
Potassium: 4.2 mmol/L (ref 3.5–5.2)
Sodium: 140 mmol/L (ref 134–144)
Total Protein: 6.5 g/dL (ref 6.0–8.5)
eGFR: 33 mL/min/{1.73_m2} — ABNORMAL LOW (ref >59)

## 2021-09-06 LAB — RETICULOCYTES
Immature Retic Fract: 24.6 % — ABNORMAL HIGH (ref 2.3–15.9)
RBC.: 2.95 MIL/uL — ABNORMAL LOW (ref 4.22–5.81)
Retic Count, Absolute: 37.5 10*3/uL (ref 19.0–186.0)
Retic Ct Pct: 1.3 % (ref 0.4–3.1)

## 2021-09-06 LAB — PROTIME-INR
INR: 4.4 (ref 0.8–1.2)
Prothrombin Time: 41.8 seconds — ABNORMAL HIGH (ref 11.4–15.2)

## 2021-09-06 LAB — IRON AND TIBC
Iron: 9 ug/dL — ABNORMAL LOW (ref 45–182)
Saturation Ratios: 2 % — ABNORMAL LOW (ref 17.9–39.5)
TIBC: 525 ug/dL — ABNORMAL HIGH (ref 250–450)
UIBC: 516 ug/dL

## 2021-09-06 LAB — FOLATE: Folate: 11.1 ng/mL (ref >5.9)

## 2021-09-06 LAB — POC OCCULT BLOOD, ED: Fecal Occult Bld: POSITIVE — AB

## 2021-09-06 LAB — PREPARE RBC (CROSSMATCH)

## 2021-09-06 LAB — ABO/RH: ABO/RH(D): A NEG

## 2021-09-06 LAB — FERRITIN: Ferritin: 3 ng/mL — ABNORMAL LOW (ref 24–336)

## 2021-09-06 LAB — VITAMIN B12: Vitamin B-12: 211 pg/mL (ref 180–914)

## 2021-09-06 MED ORDER — PANTOPRAZOLE SODIUM 40 MG IV SOLR
40.0000 mg | Freq: Two times a day (BID) | INTRAVENOUS | Status: DC
  Administered 2021-09-06 – 2021-09-10 (×9): 40 mg via INTRAVENOUS
  Filled 2021-09-06 (×9): qty 10

## 2021-09-06 MED ORDER — ONDANSETRON HCL 4 MG/2ML IJ SOLN: 4.0000 mg | Freq: Four times a day (QID) | INTRAMUSCULAR | Status: DC | PRN

## 2021-09-06 MED ORDER — ONDANSETRON HCL 4 MG PO TABS
4.0000 mg | ORAL_TABLET | Freq: Four times a day (QID) | ORAL | Status: DC | PRN
Start: 2021-09-06 — End: 2021-09-10

## 2021-09-06 MED ORDER — PREGABALIN 75 MG PO CAPS
75.0000 mg | ORAL_CAPSULE | Freq: Every day | ORAL | Status: DC
  Administered 2021-09-06 – 2021-09-09 (×4): 75 mg via ORAL
  Filled 2021-09-06 (×4): qty 1

## 2021-09-06 MED ORDER — SALINE SPRAY 0.65 % NA SOLN
1.0000 | NASAL | Status: DC | PRN
  Administered 2021-09-07: 1 via NASAL
  Filled 2021-09-06: qty 44

## 2021-09-06 MED ORDER — SODIUM CHLORIDE 0.9 % IV SOLN
10.0000 mL/h | Freq: Once | INTRAVENOUS | Status: AC
  Administered 2021-09-06: 10 mL/h via INTRAVENOUS

## 2021-09-06 MED ORDER — SENNOSIDES-DOCUSATE SODIUM 8.6-50 MG PO TABS: 1.0000 | ORAL_TABLET | Freq: Every evening | ORAL | Status: DC | PRN

## 2021-09-06 MED ORDER — FENOFIBRATE 54 MG PO TABS
54.0000 mg | ORAL_TABLET | Freq: Every day | ORAL | Status: DC
  Administered 2021-09-06 – 2021-09-10 (×5): 54 mg via ORAL
  Filled 2021-09-06 (×6): qty 1

## 2021-09-06 MED ORDER — SERTRALINE HCL 100 MG PO TABS
100.0000 mg | ORAL_TABLET | Freq: Every day | ORAL | Status: DC
  Administered 2021-09-06 – 2021-09-10 (×5): 100 mg via ORAL
  Filled 2021-09-06 (×5): qty 1

## 2021-09-06 MED ORDER — LEVOTHYROXINE SODIUM 112 MCG PO TABS
112.0000 ug | ORAL_TABLET | Freq: Every day | ORAL | Status: DC
  Administered 2021-09-07 – 2021-09-10 (×3): 112 ug via ORAL
  Filled 2021-09-06 (×3): qty 1

## 2021-09-06 MED ORDER — ATORVASTATIN CALCIUM 80 MG PO TABS
80.0000 mg | ORAL_TABLET | Freq: Every day | ORAL | Status: DC
  Administered 2021-09-06 – 2021-09-09 (×4): 80 mg via ORAL
  Filled 2021-09-06 (×4): qty 1

## 2021-09-06 MED ORDER — EMPAGLIFLOZIN 10 MG PO TABS
10.0000 mg | ORAL_TABLET | Freq: Every day | ORAL | Status: DC
Start: 2021-09-06 — End: 2021-09-10
  Administered 2021-09-06 – 2021-09-10 (×5): 10 mg via ORAL
  Filled 2021-09-06 (×5): qty 1

## 2021-09-06 MED ORDER — ACETAMINOPHEN 650 MG RE SUPP: 650.0000 mg | Freq: Four times a day (QID) | RECTAL | Status: DC | PRN

## 2021-09-06 MED ORDER — INSULIN ASPART 100 UNIT/ML IJ SOLN
0.0000 [IU] | Freq: Three times a day (TID) | INTRAMUSCULAR | Status: DC
  Administered 2021-09-07 – 2021-09-08 (×3): 2 [IU] via SUBCUTANEOUS
  Administered 2021-09-10: 3 [IU] via SUBCUTANEOUS

## 2021-09-06 MED ORDER — ACETAMINOPHEN 325 MG PO TABS
650.0000 mg | ORAL_TABLET | Freq: Four times a day (QID) | ORAL | Status: DC | PRN
  Administered 2021-09-09: 650 mg via ORAL
  Filled 2021-09-06 (×2): qty 2

## 2021-09-06 NOTE — ED Notes (Signed)
GI at bedside

## 2021-09-06 NOTE — Consult Note (Addendum)
Consultation  Referring Provider: ER MD Lynelle Doctor Primary Care Physician:  Carmel Sacramento, MD Primary Gastroenterologist: none/unassigned  Reason for Consultation:   profound microcytic anemia, heme positive  HPI: Jonathon [name] is a 71 y.o. male, who is being admitted through the emergency room this afternoon, after he presented with progressive fatigue and weakness.  He had been seen by his primary care physician yesterday, had labs done and was found to have severe anemia with hemoglobin in the 4 range. He says his current symptoms have been going on for the past 3 months or so he has gotten so fatigued and weak that he is barely able to get from a chair to his bed.  He has had exertional dyspnea but no chest pain. He has no complaints of abdominal discomfort, has not noted any melena or hematochezia, no changes in his bowel habits.  No nausea or vomiting, no heartburn or indigestion, no dysphagia.  Appetite has not been great.  He has not been on a PPI. He is on Xarelto and aspirin chronically with history of atrial fibrillation, prior DVT and also with peripheral vascular disease, he is status post right lower extremity embolectomy in 2021 and amputation of the right great toe. He has history of congestive heart failure with EF of 40%, chronic kidney disease stage III, diabetes mellitus, hypertension. His chart lists history of ulcer disease, he says he may have had an ulcer 25 years ago that he was treated for but is not sure whether he had an endoscopy. He had a colonoscopy 10+ years ago done in [place] which was done for screening and negative. No family history of colon cancer.  Work-up in the ER today with hemoglobin 4.1/hematocrit 16.5/MCV 59 BUN 25/creatinine 2.4 LFTs within normal limits Ferritin 3/iron 9/TIBC 525/iron sat 2  Stool documented heme positive in the ER, dark stool not grossly melenic  He is receiving 3 units of packed RBCs Last dose of Xarelto was last  evening   Past Medical History:  Diagnosis Date   Anemia due to blood loss, acute 2014   2nd GIB   Anxiety    CKD (chronic kidney disease), stage III ([facility]) 2014   Diabetes mellitus without complication ([facility])    Gout    High cholesterol    Hypertension 2014   Hypothyroid    Neuropathy    PUD (peptic ulcer disease) 2014    Past Surgical History:  Procedure Laterality Date   ABDOMINAL AORTOGRAM W/LOWER EXTREMITY Bilateral 06/27/2019   Procedure: ABDOMINAL AORTOGRAM W/LOWER EXTREMITY;  Surgeon: Sherren Kerns, MD;  Location: [facility];  Service: Vascular;  Laterality: Bilateral;   ADRENALECTOMY     AMPUTATION Right 07/18/2019   Procedure: AMPUTATION RIGHT GREAT TOE;  Surgeon: Cephus Shelling, MD;  Location: [facility] OR;  Service: Vascular;  Laterality: Right;   EMBOLECTOMY Right 06/27/2019   Procedure: RIGHT LEG EMBOLECTOMY;  Surgeon: Sherren Kerns, MD;  Location: [facility] OR;  Service: Vascular;  Laterality: Right;    Prior to Admission medications   Medication Sig Start Date End Date Taking? Authorizing Provider  aspirin EC 81 MG EC tablet Take 1 tablet (81 mg total) by mouth daily. 07/01/19   Emilie Rutter, PA-C  atorvastatin (LIPITOR) 80 MG tablet Take 1 tablet (80 mg total) by mouth at bedtime. 11/01/20   Kroeger, Ovidio Kin., PA-C  fenofibrate 54 MG tablet TAKE 1 TABLET(54 MG) BY MOUTH DAILY 09/05/21   Carmel Sacramento, MD  JARDIANCE 10 MG TABS  tablet TAKE 1 TABLET BY MOUTH EVERY DAY 09/05/21   Carmel Sacramento, MD  levothyroxine (SYNTHROID) 112 MCG tablet TAKE 1 TABLET (112 MCG TOTAL) BY MOUTH AT BEDTIME. 04/16/21   Doran Stabler, DO  metoprolol succinate (TOPROL XL) 25 MG 24 hr tablet Take 1 tablet (25 mg total) by mouth daily. 08/01/21 08/01/22  Doran Stabler, DO  pantoprazole (PROTONIX) 40 MG tablet TAKE 1 TABLET BY MOUTH TWICE A DAY 09/05/21   Carmel Sacramento, MD  pregabalin (LYRICA) 75 MG capsule Take 2 capsules (150 mg total) by mouth at bedtime. 05/22/21   Carmel Sacramento, MD  sertraline  (ZOLOFT) 100 MG tablet TAKE 1 TABLET BY MOUTH EVERY DAY 08/27/21   Carmel Sacramento, MD  tamsulosin (FLOMAX) 0.4 MG CAPS capsule TAKE 1 CAPSULE BY MOUTH EVERY DAY 07/11/21   Carmel Sacramento, MD  XARELTO 20 MG TABS tablet TAKE 1 TABLET BY MOUTH EVERY DAY 09/05/21   Carmel Sacramento, MD    No current facility-administered medications for this encounter.   Current Outpatient Medications  Medication Sig Dispense Refill   aspirin EC 81 MG EC tablet Take 1 tablet (81 mg total) by mouth daily.     atorvastatin (LIPITOR) 80 MG tablet Take 1 tablet (80 mg total) by mouth at bedtime. 90 tablet 3   fenofibrate 54 MG tablet TAKE 1 TABLET(54 MG) BY MOUTH DAILY 90 tablet 1   JARDIANCE 10 MG TABS tablet TAKE 1 TABLET BY MOUTH EVERY DAY 90 tablet 1   levothyroxine (SYNTHROID) 112 MCG tablet TAKE 1 TABLET (112 MCG TOTAL) BY MOUTH AT BEDTIME. 90 tablet 0   metoprolol succinate (TOPROL XL) 25 MG 24 hr tablet Take 1 tablet (25 mg total) by mouth daily. 30 tablet 11   pantoprazole (PROTONIX) 40 MG tablet TAKE 1 TABLET BY MOUTH TWICE A DAY 180 tablet 1   pregabalin (LYRICA) 75 MG capsule Take 2 capsules (150 mg total) by mouth at bedtime. 180 capsule 0   sertraline (ZOLOFT) 100 MG tablet TAKE 1 TABLET BY MOUTH EVERY DAY 90 tablet 4   tamsulosin (FLOMAX) 0.4 MG CAPS capsule TAKE 1 CAPSULE BY MOUTH EVERY DAY 90 capsule 1   XARELTO 20 MG TABS tablet TAKE 1 TABLET BY MOUTH EVERY DAY 30 tablet 5    Allergies as of 09/06/2021 - Review Complete 09/06/2021  Allergen Reaction Noted   Morphine and related Itching 06/08/2019    Family History  Problem Relation Age of Onset   Diabetes Mellitus II Maternal Aunt    Lung cancer Mother    Pancreatic cancer Sister     Social History   Socioeconomic History   Marital status: Married    Spouse name: Not on file   Number of children: Not on file   Years of education: Not on file   Highest education level: Not on file  Occupational History   Occupation: banking    Comment:  retired  Tobacco Use   Smoking status: Former    Packs/day: 2.00    Years: 3.00    Pack years: 6.00    Types: Cigarettes   Smokeless tobacco: Former    Quit date: 05/08/1967  Vaping Use   Vaping Use: Never used  Substance and Sexual Activity   Alcohol use: Yes    Alcohol/week: 14.0 standard drinks    Types: 14 Shots of liquor per week    Comment: daily   Drug use: Yes    Types: Marijuana   Sexual activity: Not on file  Other Topics  Concern   Not on file  Social History Narrative   Not on file   Social Determinants of Health   Financial Resource Strain: Not on file  Food Insecurity: Not on file  Transportation Needs: Not on file  Physical Activity: Not on file  Stress: Not on file  Social Connections: Not on file  Intimate Partner Violence: Not on file    Review of Systems: Pertinent positive and negative review of systems were noted in the above HPI section.  All other review of systems was otherwise negative.   Physical Exam: Vital signs in last 24 hours: Temp:  [98.1 F (36.7 C)-98.8 F (37.1 C)] 98.7 F (37.1 C) (05/12 1325) Pulse Rate:  [89-109] 100 (05/12 1325) Resp:  [13-28] 15 (05/12 1325) BP: (97-110)/(63-80) 110/80 (05/12 1325) SpO2:  [100 %] 100 % (05/12 1325) Weight:  [112.3 kg] 112.3 kg (05/12 1101)   General:   Alert,  Well-developed, chronically ill-appearing older white male, pale pleasant and cooperative in NAD Wife at bedside Head:  Normocephalic and atraumatic. Eyes:  Sclera clear, no icterus.   Conjunctiva pale Ears:  Normal auditory acuity. Nose:  No deformity, discharge,  or lesions. Mouth:  No deformity or lesions.   Neck:  Supple; no masses or thyromegaly. Lungs:  Clear throughout to auscultation.   No wheezes, crackles, or rhonchi.  Heart: Tachycardia regular rate and rhythm; no murmurs, clicks, rubs,  or gallops. Abdomen:  Soft,nontender, BS active,nonpalp mass or hsm.   Rectal: Not done, documented heme positive/dark stool per ER MD  no gross melena Msk:  Symmetrical without gross deformities. . Pulses:  Normal pulses noted. Extremities:  Without clubbing or edema. Neurologic:  Alert and  oriented x4;  grossly normal neurologically. Skin:  Intact without significant lesions or rashes.. Psych:  Alert and cooperative. Normal mood and affect.  Intake/Output from previous day: No intake/output data recorded. Intake/Output this shift: No intake/output data recorded.  Lab Results: Recent Labs    09/05/21 1521 09/06/21 1107  WBC 5.0 4.6  HGB 4.5* 4.1*  HCT 18.9* 16.5*  PLT 162 PLATELET CLUMPS NOTED ON SMEAR, UNABLE TO ESTIMATE   BMET Recent Labs    09/05/21 1521 09/06/21 1107  NA 140 138  K 4.2 3.8  CL 104 108  CO2 20 24  GLUCOSE 192* 250*  BUN 23 25*  CREATININE 2.10* 2.40*  CALCIUM 9.3 9.3   LFT Recent Labs    09/06/21 1107  PROT 6.1*  ALBUMIN 3.4*  AST 15  ALT 9  ALKPHOS 29*  BILITOT 1.0   PT/INR No results for input(s): LABPROT, INR in the last 72 hours. Hepatitis Panel No results for input(s): HEPBSAG, HCVAB, HEPAIGM, HEPBIGM in the last 72 hours.   IMPRESSION:  #82 71 year old white male with 3+ month history of progressive fatigue and weakness, exertional dyspnea, and found to have hemoglobin of 4 yesterday as an outpatient. Documented heme positive today without overt melena Chronic anticoagulation with Xarelto and aspirin  Iron studies today consistent with severe iron deficiency  Suspect patient has had chronic slow GI blood loss over at least the past 3 to 4 months.  We will need to rule out upper versus lower GI neoplasm, AVMs, chronic gastropathy.  #2 chronic kidney disease stage III #3 congestive heart failure-EF 40% #4 diabetes mellitus #5.  History of atrial fibrillation #6.  Prior history of DVT #7.  Peripheral vascular disease status post right lower extremity embolectomy and amputation of the right great toe 2021  Plan; full liquid diet today Initial plan for 3  units of packed RBCs, then transfuse to keep hemoglobin 7.5 Start IV Protonix every 12 hours Will need iron infusion this admission Hold Xarelto, last dose last night. He will need endoscopic evaluation this admission with colonoscopy and EGD.  We will let him be transfused today, plan for bowel prep tomorrow and colonoscopy/EGD with Dr. Myrtie Neither on Sunday, 09/08/2021.  GI will follow with you    Amy Esterwood PA-C 09/06/2021, 1:36 PM  I have taken an interval history, thoroughly reviewed the chart and examined the patient. I agree with the Advanced Practitioner's note, impression and recommendations, and have recorded additional findings, impressions and recommendations below. I performed a substantive portion of this encounter (>50% time spent), including a complete performance of the medical decision making.  My additional thoughts are as follows:  Multifactorial anemia of chronic kidney disease and occult GI blood loss. No overt GI bleeding at present, patient is also therapeutic on Xarelto for A-fib with history of CHF.  Profound anemia hemoglobin of 4, is now starting transfusions.  The plan is for an EGD/colonoscopy during this admission, though not tomorrow because he needs further slow transfusion and history of CHF.  He also needs further time off anticoagulation before endoscopic procedures. We will see him tomorrow reassess him and determine timing of endoscopic work-up.  Full liquid diet in the meantime.   Charlie Pitter III Office:[phone]

## 2021-09-06 NOTE — Telephone Encounter (Signed)
Spoke with patient's wife. She just dropped patient off at ED and is parking car. Patient has been checked in to [facility]. ?

## 2021-09-06 NOTE — Telephone Encounter (Signed)
I called and left a VM for this patient following up on critical lab from yesterday's office visit. Patient has severe anemia, overnight physician advised them to go to the ED immediately, at risk for bad outcome with this degree of anemia. I don't see in our chart that he did go to the ED, not currently on the census, so I want to follow up with them.  ? ?Can some one from triage help follow up with this person throughout today. Just need to impress upon them the importance of coming to the hospital urgently for treatment of the anemia. ?

## 2021-09-06 NOTE — Hospital Course (Addendum)
This is a 71 y.o. male with hx of PUD, HTN, atrial fibrillation on xarelto, HFrEF, hyperlipidemia, type 2 diabetes mellitus, CKD stage III, gout, hypothyroidism, and anxiety who presented per recommendation from PCP for severe anemia and admitted for GI bleed, found to have multiple polyps, gastritis, and diverticulosis.  ? ?Severe symptomatic anemia secondary to GI bleed ?Patient presented from PCP with Hb 4.1, found to have positive FOBT, and admitted for management of GI bleed. Total iron deficit 2629 mg. Endoscopy notable for gastritis, which was biopsied, and multiple polyps in the stomach, which were resected. Colonoscopy notable for diverticulosis and multiple polyps in transverse colon, ascending colon, cecum, proximal rectum, and sigmoid colon, which were resected. No active bleeding seen on endoscopic evaluation but suspect slow bleeding secondary to polyps or gastritis. Lower concern for bleed in the jejunum / ileum not visualized with endoscopy but will consider if having ongoing bleeding with outpatient capsule endoscopy. He received a total of 5 units pRBCs and 750 mg IV iron. At the time of discharge, his hemoglobin had remained stable at 8.7. Given the extent of his iron deficiency, he will likely need to be on PO iron outpatient but will defer to PCP.  ? ?Fever, likely secondary to viral respiratory infection  ?Patient had been having ongoing postnasal drainage with cough throughout admission, treated with mucinex and lozenges. Overnight after endoscopy/colonoscopy, he spiked a fever to 101.2 with accompanying tachycardia to 130, tachypnea to 24, and hypotension to 86/74. He continued to oxygenate well and had no leukocytosis.Vitals normalized the following morning. Diffuse wheezing heard on exam but no signs of increased work of breathing or focal crackles. Abdominal exam benign. Patient denied any abdominal or urinary symptoms. Fever thought to be secondary to developing viral respiratory tract  infection. Superimposed bacterial pneumonia with viral illness unlikely given clinical improvement and lack of focal crackles on exam. Aspiration pneumonia considered in the setting of recent sedation for endoscopy/colonoscopy but timeline would indicate aspiration pneumonitis more than aspiration pneumonia. Bowel perforation also considered due to recent endoscopic evaluation but no abnormalities on abdominal exam and patient tolerating PO well. Due to patient's strong desire to go home, close support system, and hemodynamic stability the following morning, the decision was made to discharge with close outpatient monitoring.  ? ?AKI on CKD IIIb - resolved  ?Patient presented with creatinine 2.4 on admission with baseline 1.8-2.0. AKI thought to be pre-renal in the setting of acute blood loss and initial hypotension. Creatinine improved throughout admission with blood transfusions and IVF.  Creatinine within baseline range at the time of discharge.  ? ?HTN ?Patient was initially hypotensive on admission so home metoprolol was held but SBPs subsequently normalized to 120s-130s and metoprolol was restarted.  ? ?Atrial fibrillation ?While admitted, patient had an episode of RVR, for which metoprolol was temporarily increased but he was resumed on his home regimen at the time of discharge.   ? ?Hypokalemia  Hypomagnesemia ?Repleted PRN ? ?

## 2021-09-06 NOTE — Progress Notes (Signed)
Internal Medicine Clinic Attending ? ?Case discussed with Dr. Darrick Meigs  At the time of the visit.  We reviewed the resident?s history and exam and pertinent patient test results.  I agree with the assessment, diagnosis, and plan of care documented in the resident?s note.  ? ?New severe symptomatic microcytic anemia, very likely to be iron deficiency. Patient was advised to go straight to the ED for blood transfusion and admission to evaluate the pace and source of the anemia. ?

## 2021-09-06 NOTE — Progress Notes (Signed)
? ?Office Visit ? ? Patient ID: Jonathon [name], male    DOB: Jan 05, 1951, 71 y.o.   MRN: [id number]   PCP: Lajean Manes, MD  ? ?Subjective:  ?CC: Fatigue, Dizziness (Increase ), and Medication Refill (Xarelto )  ? ?Jonathon [name] is a 71 y.o. year old male who presents for the above medical condition(s). Please refer to problem based charting for assessment and plan. ? ? ? ?Objective:  ? ?BP 97/63 (BP Location: Right Arm, Patient Position: Sitting, Cuff Size: Large)   Pulse 92   Temp 98.1 ?F (36.7 ?C) (Oral)   Resp (!) 28   Ht 6' (1.829 m)   Wt 247 lb 9.6 oz (112.3 kg)   SpO2 100% Comment: room air  BMI 33.58 kg/m?  ? ?General: Chronically ill-appearing in no distress, pale. ?Cardiac: Heart regular rate and rhythm, trace lower extremity edema ?Pulm: Breathing comfortably on room air, lung sounds are clear ?GI: Abdomen soft, nondistended, nontender ?Neuro: Alert and oriented.  During our visit today, while I was chatting with him and his wife, I did look back over to him and he had fallen asleep.  He woke easily and it did not happen again. ?Assessment & Plan:  ? ?Problem List Items Addressed This Visit   ? ? Fatigue, orthostasis  ?  HPI: He presents to the office today for 1 year history of progressive fatigue and orthostatic symptoms. ? ?He reports feeling tired all day from the time he wakes up.  He reports that he has been working with physical therapy and feels completely exhausted even with minimal exertion-- reports that he feels like he could take a 3-hour nap after just 2 minutes on the treadmill. ? ?He is also experiencing orthostatic symptoms upon going from sitting to standing, developing dizziness.  Symptoms occur every time he does this.  He experienced 1 fall attributable to this about 6 months ago but has not fallen since then.  He has been conscientious of his symptoms and making sure that the lightheadedness improves prior to him starting to walk.   ?He denies any changes in his appetite  or fluid intake.  He reports drinking about 5 or 6 bottles of water per day.  Denies any other associated symptoms.  Denies abdominal pain, nausea, vomiting, diarrhea, constipation.  Denies any overt bleeding including melena or hematochezia. ? ?Assessment: DDx is broad at this time.  Suspect that the fatigue and orthostasis are interrelated.  He does not appear dry on exam.  He does appear pale and is on Xarelto so anemia should be considered.  He is also on several medications that could be contributing to his orthostasis including Flomax, Lyrica, Jardiance, and metoprolol. ?Orthostatic vitals are unrevealing in the clinic.  His wife however did bring a wonderfully recorded blood pressure log of his to the visit today which shows that he has been having some intermittent hypotension at home, with systolic blood pressures going as low as the mid 80s. ? ?Plan ?- Check CBC and CMP ?- He denies any obstructive issues related to BPH and had been started on it over the past year due to increased urinary frequency.  I will have him hold Flomax. ?- If no major abnormalities are present on his labs from today, I will have him return next week for reevaluation. ? ?  ?  ? ? ?Return in about 1 week (around 09/12/2021). ? ? ?Pt discussed with Dr. Evette Doffing ? ?Mitzi Hansen, MD ?Internal Medicine Resident PGY-3 ?[facility] ?09/06/2021 8:20 AM  ?  ?

## 2021-09-06 NOTE — ED Notes (Signed)
Pt ambulatory to restroom x 1 assistance. Pt denies dizziness/sob at this time.  ?

## 2021-09-06 NOTE — ED Provider Notes (Signed)
?Jonathon [name] ?Provider Note ? ? ?CSN: [id number] ?Arrival date & time: 09/06/21  1050 ? ?  ? ?History ? ?Chief Complaint  ?Patient presents with  ? low hemoglobin  ? ? ?Jonathon [name] is a 71 y.o. male. ? ?HPI ? ?Patient has a history of hypothyroidism hyperlipidemia, DVT, central hypertension, chronic kidney disease, diabetes, CHF, chronic A-fib, GERD, depression who presents to the ED for low hemoglobin.  Patient states he has been having issues with progressive fatigue and orthostatic symptoms for a long period of time.  Patient has had daytime somnolence.  He gets very fatigued and winded with any minimal activity.  He denies any fevers or chills.  No chest pain.  Is not had any abdominal pain.  He denies any blood in his stool.  He does have history of chronic peripheral edema but feels its actually been getting better.  Patient followed up with his doctor yesterday and they did laboratory tests.  He was called last evening and was notified that his hemoglobin was very low and he needed to come to the hospital. ? ?Home Medications ?Prior to Admission medications   ?Medication Sig Start Date End Date Taking? Authorizing Provider  ?aspirin EC 81 MG EC tablet Take 1 tablet (81 mg total) by mouth daily. 07/01/19   Dagoberto Ligas, PA-C  ?atorvastatin (LIPITOR) 80 MG tablet Take 1 tablet (80 mg total) by mouth at bedtime. 11/01/20   Kroeger, Lorelee Cover., PA-C  ?fenofibrate 54 MG tablet TAKE 1 TABLET(54 MG) BY MOUTH DAILY 09/05/21   Lajean Manes, MD  ?JARDIANCE 10 MG TABS tablet TAKE 1 TABLET BY MOUTH EVERY DAY 09/05/21   Lajean Manes, MD  ?levothyroxine (SYNTHROID) 112 MCG tablet TAKE 1 TABLET (112 MCG TOTAL) BY MOUTH AT BEDTIME. 04/16/21   Gaylan Gerold, DO  ?metoprolol succinate (TOPROL XL) 25 MG 24 hr tablet Take 1 tablet (25 mg total) by mouth daily. 08/01/21 08/01/22  Gaylan Gerold, DO  ?pantoprazole (PROTONIX) 40 MG tablet TAKE 1 TABLET BY MOUTH TWICE A DAY 09/05/21   Lajean Manes, MD   ?pregabalin (LYRICA) 75 MG capsule Take 2 capsules (150 mg total) by mouth at bedtime. 05/22/21   Lajean Manes, MD  ?sertraline (ZOLOFT) 100 MG tablet TAKE 1 TABLET BY MOUTH EVERY DAY 08/27/21   Lajean Manes, MD  ?tamsulosin (FLOMAX) 0.4 MG CAPS capsule TAKE 1 CAPSULE BY MOUTH EVERY DAY 07/11/21   Lajean Manes, MD  ?Alveda Reasons 20 MG TABS tablet TAKE 1 TABLET BY MOUTH EVERY DAY 09/05/21   Lajean Manes, MD  ?   ? ?Allergies    ?Morphine and related   ? ?Review of Systems   ?Review of Systems  ?Constitutional:  Negative for fever.  ? ?Physical Exam ?Updated Vital Signs ?BP 106/75   Pulse 89   Temp 98.8 ?F (37.1 ?C) (Oral)   Resp 13   Ht 1.829 m (6')   Wt 112.3 kg   SpO2 100%   BMI 33.58 kg/m?  ?Physical Exam ?Vitals and nursing note reviewed.  ?Constitutional:   ?   Appearance: He is well-developed. He is not diaphoretic.  ?HENT:  ?   Head: Normocephalic and atraumatic.  ?   Right Ear: External ear normal.  ?   Left Ear: External ear normal.  ?Eyes:  ?   General: No scleral icterus.    ?   Right eye: No discharge.     ?   Left eye: No discharge.  ?   Conjunctiva/sclera: Conjunctivae  normal.  ?Neck:  ?   Trachea: No tracheal deviation.  ?Cardiovascular:  ?   Rate and Rhythm: Tachycardia present. Rhythm irregular.  ?Pulmonary:  ?   Effort: Pulmonary effort is normal. No respiratory distress.  ?   Breath sounds: Normal breath sounds. No stridor. No wheezing or rales.  ?Abdominal:  ?   General: Bowel sounds are normal. There is no distension.  ?   Palpations: Abdomen is soft.  ?   Tenderness: There is no abdominal tenderness. There is no guarding or rebound.  ?Genitourinary: ?   Comments: Stool dark in color no gross blood ?Musculoskeletal:     ?   General: No tenderness or deformity.  ?   Cervical back: Neck supple.  ?   Right lower leg: Edema present.  ?   Left lower leg: Edema present.  ?   Comments: Mild edema bilateral lower extremities  ?Skin: ?   General: Skin is warm and dry.  ?   Coloration: Skin is pale.  ?    Findings: No rash.  ?Neurological:  ?   General: No focal deficit present.  ?   Mental Status: He is alert.  ?   Cranial Nerves: No cranial nerve deficit (no facial droop, extraocular movements intact, no slurred speech).  ?   Sensory: No sensory deficit.  ?   Motor: No abnormal muscle tone or seizure activity.  ?   Coordination: Coordination normal.  ?Psychiatric:     ?   Mood and Affect: Mood normal.  ? ? ?ED Results / Procedures / Treatments   ?Labs ?(all labs ordered are listed, but only abnormal results are displayed) ?Labs Reviewed  ?COMPREHENSIVE METABOLIC PANEL - Abnormal; Notable for the following components:  ?    Result Value  ? Glucose, Bld 250 (*)   ? BUN 25 (*)   ? Creatinine, Ser 2.40 (*)   ? Total Protein 6.1 (*)   ? Albumin 3.4 (*)   ? Alkaline Phosphatase 29 (*)   ? GFR, Estimated 28 (*)   ? All other components within normal limits  ?CBC - Abnormal; Notable for the following components:  ? RBC 2.79 (*)   ? Hemoglobin 4.1 (*)   ? HCT 16.5 (*)   ? MCV 59.1 (*)   ? MCH 14.7 (*)   ? MCHC 24.8 (*)   ? RDW 23.0 (*)   ? nRBC 1.3 (*)   ? All other components within normal limits  ?RETICULOCYTES - Abnormal; Notable for the following components:  ? RBC. 2.95 (*)   ? Immature Retic Fract 24.6 (*)   ? All other components within normal limits  ?VITAMIN B12  ?FOLATE  ?IRON AND TIBC  ?FERRITIN  ?POC OCCULT BLOOD, ED  ?TYPE AND SCREEN  ?ABO/RH  ?PREPARE RBC (CROSSMATCH)  ? ? ?EKG ?None ? ?Radiology ?No results found. ? ?Procedures ?Marland KitchenCritical Care ?Performed by: Dorie Rank, MD ?Authorized by: Dorie Rank, MD  ? ?Critical care provider statement:  ?  Critical care time (minutes):  30 ?  Critical care was time spent personally by me on the following activities:  Development of treatment plan with patient or surrogate, discussions with consultants, evaluation of patient's response to treatment, examination of patient, ordering and review of laboratory studies, ordering and review of radiographic studies, ordering and  performing treatments and interventions, pulse oximetry, re-evaluation of patient's condition and review of old charts  ? ? ?Medications Ordered in ED ?Medications  ?0.9 %  sodium chloride infusion (  has no administration in time range)  ? ? ?ED Course/ Medical Decision Making/ A&P ?Clinical Course as of 09/06/21 1303  ?Fri Sep 06, 2021  ?1219 CBC(!!) ?Hemoglobin extremely low at 4.1 [JK]  ?1227 Comprehensive metabolic panel(!) ?Metabolic panel shows chronic elevation in creatinine.  Not significantly changed from baseline. [JK]  ?1227 Comprehensive metabolic panel(!) ?Hyperglycemia noted consistent with his diabetes [JK]  ?1249 Case discussed with internal medicine service regarding admission [JK]  ?1303 Case discussed with Nicoletta Ba, GI [JK]  ?  ?Clinical Course User Index ?[JK] Dorie Rank, MD  ? ?                        ?Medical Decision Making ?Patient presented to the ED for evaluation of severe anemia.  Unknown etiology.  Patient denies any recent bleeding.  Has not noticed any dark stools or blood in the stool.  He is on chronic anticoagulation.  We will send off an anemia panel but he may have been having occult GI bleeding associated with his anticoagulation use ? ?Problems Addressed: ?Acute GI bleeding: acute illness or injury that poses a threat to life or bodily functions ?Atrial fibrillation, unspecified type [facility]): chronic illness or injury ?Hyperglycemia: chronic illness or injury ?Peripheral edema: chronic illness or injury ?Symptomatic anemia: acute illness or injury that poses a threat to life or bodily functions ?   Details: Due to acute GI bleeding ? ?Amount and/or Complexity of Data Reviewed ?Labs: ordered. Decision-making details documented in ED Course. ? ?Risk ?Prescription drug management. ?Decision regarding hospitalization. ? ? ? ?Patient presented to the ED for evaluation of anemia.  Labs do confirm his extremely low hemoglobin level at 4.  This would certainly account for his  symptoms.  Patient has been orthostatic.  The patient's stool is hemmocult positive.  The Source of his blood loss is due to GI bleeding.  I have ordered blood transfusions.  We will continue to monitor closely.  I w

## 2021-09-06 NOTE — Telephone Encounter (Signed)
Thank you for that quick follow up. ?

## 2021-09-06 NOTE — H&P (Signed)
?Date: 09/06/2021     ?     ?     ?Patient Name:  Jonathon [name] MRN: [id number]  ?DOB: 03-15-1951 Age / Sex: 71 y.o., male   ?PCP: Lajean Manes, MD    ?     ?     ?Medical Service: Internal Medicine Teaching Service    ?     ?     ?Attending Physician: Dr. Velna Ochs, MD    ?First Contact: [name], [place] [address] Pager: [phone]  ?Second Contact: Dr. Coy Saunas Pager: [phone]  ?     ?     ?After Hours (After 5p/  First Contact Pager: [phone]  ?weekends / holidays): Second Contact Pager: [phone]  ? ?Chief Complaint: fatigue, lightheadedness when standing  ? ?History of Present Illness: This is a 71 year old male with a history of peptic ulcer disease in 2014, hypertension, atrial fibrillation on xarelto, HFrEF, hyperlipidemia, type 2 diabetes mellitus, CKD stage III, gout, hypothyroidism, and anxiety who presented per recommendation from PCP for severe anemia.  ? ?He reports that he has had worsening shortness of breath, generalized weakness worst at the legs, and dizziness upon standing over the past three months. He has tried physical therapy but it has not been helping. Denies chest pain or palpitations. Endorses decreased appetite over the past two years but denies dysphagia, odynophagia, nausea, vomiting, heartburn, diarrhea, constipation, or abdominal pain. Denies fevers or chills. Denies hematemesis, hemoptysis, hematuria, or melena but admits that he does not routinely check the appearance of his stool. He was evaluated by Dr. Darrick Meigs at [facility] yesterday for these symptoms, noted to have severe anemia with Hb 4.5, and advised to present to the ED immediately. Tamsulosin was also held at this time due to concern for orthostatic hypotension. He is on pantoprazole, aspirin, and xarelto at home. Denies NSAID use.  ? ?In the ED, he was found to be tachycardic, tachypneic, and hypotensive. Hb was 4.1 with MCV 59.1, ferritin 3, iron 9, TIBC 525, and retics 1.3%. Vitamin B12 and folate within normal limits. INR  4.4. Creatine was 2.4 from baseline of 1.7. Albumin 3.4. He was treated with 3 units of pRBCs and started on IV pantoprazole.  ? ? ?Meds: ?Current Meds  ?Medication Sig  ? aspirin EC 81 MG EC tablet Take 1 tablet (81 mg total) by mouth daily.  ? atorvastatin (LIPITOR) 80 MG tablet Take 1 tablet (80 mg total) by mouth at bedtime.  ? fenofibrate 54 MG tablet TAKE 1 TABLET(54 MG) BY MOUTH DAILY (Patient taking differently: Take 54 mg by mouth daily.)  ? JARDIANCE 10 MG TABS tablet TAKE 1 TABLET BY MOUTH EVERY DAY (Patient taking differently: Take 10 mg by mouth daily.)  ? levothyroxine (SYNTHROID) 112 MCG tablet TAKE 1 TABLET (112 MCG TOTAL) BY MOUTH AT BEDTIME.  ? metoprolol succinate (TOPROL-XL) 50 MG 24 hr tablet Take 50 mg by mouth daily. Take with or immediately following a meal.  ? pantoprazole (PROTONIX) 40 MG tablet TAKE 1 TABLET BY MOUTH TWICE A DAY (Patient taking differently: Take 80 mg by mouth daily.)  ? pregabalin (LYRICA) 75 MG capsule Take 2 capsules (150 mg total) by mouth at bedtime. (Patient taking differently: Take 75 mg by mouth at bedtime.)  ? sertraline (ZOLOFT) 100 MG tablet TAKE 1 TABLET BY MOUTH EVERY DAY (Patient taking differently: Take 100 mg by mouth daily.)  ? XARELTO 20 MG TABS tablet TAKE 1 TABLET BY MOUTH EVERY DAY (Patient taking differently: Take 20  mg by mouth daily.)  ? ? ? ?Allergies: ?Allergies as of 09/06/2021 - Review Complete 09/06/2021  ?Allergen Reaction Noted  ? Morphine and related Itching 06/08/2019  ? ?Past Medical History:  ?Diagnosis Date  ? Anemia due to blood loss, acute 2014  ? 2nd GIB  ? Anxiety   ? CKD (chronic kidney disease), stage III ([place]) 2014  ? Diabetes mellitus without complication ([place])   ? Gout   ? High cholesterol   ? Hypertension 2014  ? Hypothyroid   ? Neuropathy   ? PUD (peptic ulcer disease) 2014  ? ? ?Family History:  ?Maternal aunt - T2DM ?Sister - pancreatic cancer  ?No family history of colon cancer or IBD ? ?Social History: Patient lives in a  house in [place] with his wife. Over the past few months, he has had difficulty ambulating because of his dyspnea but he is otherwise able to perform ADLs and IADLs without assistance. He does drive but has not gone anywhere recently. Endorses drinking a couple glasses of whiskey daily for the past 50 years. Endorses daily marijuana use. Denies smoking or use of any other recreational substances.  ? ?Review of Systems: ?A complete ROS was negative except as per HPI.  ? ?Physical Exam: ?Blood pressure 119/76, pulse 96, temperature 98.5 ?F (36.9 ?C), temperature source Oral, resp. rate 17, height 6' (1.829 m), weight 112.3 kg, SpO2 100 %. ? ?General: Well appearing and in no acute distress, appears stated age ?Neuro: A&O x4, normal affect ?HEENT: Normocephalic, atraumatic, EOMI, normal sclerae without scleral icterus or injection. ?Cardiovascular: RRR, no m/r/g. Normal S1 and S2 without S3 or S4. Pulses 2+ in bilateral UE and LE. No peripheral edema ?Pulmonary: CTAB, no wheezes, rhonchi or rales. Normal WOB, no clubbing  ?Abdominal: Abdomen soft and distended. Normoactive bowel sounds. No tenderness to palpation.  ?Skin: Warm and dry with no rashes, cuts, or bruises ?MSK: Normal ROM of all extremities. Strength 5/5 in bilateral UE and LE ? ? ?EKG: personally reviewed my interpretation is atrial fibrillation  ? ? ?Assessment & Plan by Problem: ?Principal Problem: ?  GI bleed ? ?This is a 71 year old male with a history of peptic ulcer disease in 2014, hypertension, atrial fibrillation on xarelto, HFrEF, hyperlipidemia, type 2 diabetes mellitus, CKD stage III, gout, hypothyroidism, and anxiety who presented per recommendation from PCP for severe anemia.  ? ?Severe symptomatic iron deficiency anemia secondary to lower GI bleed  ?Patient with new onset dyspnea, orthostatic hypotension, and fatigue over the past three months admitted for severe anemia. On admission, Hb 4.1 with MCV 59.1, RDW 23%, ferritin 3, iron 9,  TIBC 525, retics 1.3%, consistent with iron deficiency anemia. Vitamin B12 and folate wnl. FOBT positive, consistent with GI source. BUN/Cr ratio 10.4 so less likely to be an upper GI bleed, although patient has a history of peptic ulcer disease. Highest on the differential is a lower GI bleed secondary to colon cancer or AVMs. Colon cancer likely given age, severity of anemia, and prior DVT possibly indicative of an underlying hypercoagulable state. IBD possible but patient denies abdominal pain or other systemic IBD symptoms and would not fit with his age. Diverticular bleeding considered but no systemic signs of infection that would indicate diverticulitis and diverticulosis alone would not account for this severity of anemia.  ?- Start IV pantoprazole BID ?- S/p 3 units pRBCs, transfuse as needed to keep Hb >7.5 per GI recommendation  ?- Consider IV iron tomorrow, total deficit 2629 mg  ?-  GI following, plan to do EGD and colonoscopy on 5/14 ?- Hold xarelto and aspirin ?- Avoid NSAIDs ? ?Acute kidney injury on chronic kidney disease IIIB ?Creatinine 2.40 on admission from baseline of 1.7 in the setting of active GI bleeding with hypotension and recent tamsulosin discontinuation. BUN/Cr ratio <20 and patient appeared euvolemic on exam but pre-renal high on the differential given known GI bleeding, hypotension on presentation, and symptoms of orthostatic hypotension indicating a dry state. Also on the differential is post-renal from BPH given recent discontinuation of tamsulosin although he denies obstructive symptoms. No risk factors or medications concerning for intra-renal pathology. ?- Bladder scan  ?- Continue to monitor ? ?Hypertension ?Patient presented with hypotension, orthostatic symptoms, and active GI bleeding  ?- Hold home metoprolol  ? ?Atrial fibrillation  ?Patient with history of chronic atrial fibrillation presents with tachycardia but no signs of RVR at this time.  ?- hold xarelto given active  bleeding and pending endoscopic intervention ? ?HFrEF  ?- Hold home metoprolol given soft blood pressures ?- Continue Jardiance  ? ?Hyperlipidemia ?- Continue home atorvastatin and fenofibrate  ? ?Type 2 di

## 2021-09-06 NOTE — ED Triage Notes (Addendum)
Pt states was sent by PCP for low hemoglobin of 4.5. Pt is pale. Pt states he feels weak and dizzyx2-3 wks. Pt denies chest pain. Pt states has occasional SOB. Pt denies seeing blood in BM ?

## 2021-09-06 NOTE — Assessment & Plan Note (Signed)
HPI: He presents to the office today for 1 year history of progressive fatigue and orthostatic symptoms. ? ?He reports feeling tired all day from the time he wakes up.  He reports that he has been working with physical therapy and feels completely exhausted even with minimal exertion-- reports that he feels like he could take a 3-hour nap after just 2 minutes on the treadmill. ? ?He is also experiencing orthostatic symptoms upon going from sitting to standing, developing dizziness.  Symptoms occur every time he does this.  He experienced 1 fall attributable to this about 6 months ago but has not fallen since then.  He has been conscientious of his symptoms and making sure that the lightheadedness improves prior to him starting to walk.   ?He denies any changes in his appetite or fluid intake.  He reports drinking about 5 or 6 bottles of water per day.  Denies any other associated symptoms.  Denies abdominal pain, nausea, vomiting, diarrhea, constipation.  Denies any overt bleeding including melena or hematochezia. ? ?Assessment: DDx is broad at this time.  Suspect that the fatigue and orthostasis are interrelated.  He does not appear dry on exam.  He does appear pale and is on Xarelto so anemia should be considered.  He is also on several medications that could be contributing to his orthostasis including Flomax, Lyrica, Jardiance, and metoprolol. ?Orthostatic vitals are unrevealing in the clinic.  His wife however did bring a wonderfully recorded blood pressure log of his to the visit today which shows that he has been having some intermittent hypotension at home, with systolic blood pressures going as low as the mid 80s. ? ?Plan ?- Check CBC and CMP ?- He denies any obstructive issues related to BPH and had been started on it over the past year due to increased urinary frequency.  I will have him hold Flomax. ?- If no major abnormalities are present on his labs from today, I will have him return next week for  reevaluation. ?

## 2021-09-06 NOTE — Telephone Encounter (Signed)
12:35 AM: I received a critical lab value from [facility] for hemoglobin of 4.  Patient was seen in the clinic yesterday for low blood pressure.  His tamsulosin was stopped to avoid orthostatic hypotension.  ABC and CMP was obtained yesterday. ? ?I spoke with patient's spouse on the phone.  I informed her of the critical lab value.  She states that patient has been tired and has no energy for a while.  She is not aware of any hematochezia or melena.  Patient does not have any overt signs of bleeding.  No weight loss noted.  No past colonoscopy or upper endoscopy in the chart. ? ?Given this critical lab value, I advised patient to go to the emergency room for further work-up and blood transfusion.  Patient's wife states that she will take him in the morning.  She states that patient is currently doing fine without any symptoms. ? ?I again encouraged patient to be in the hospital as soon as possible.  Advised patient's wife that if patient develops symptoms such as dizziness, lightheadedness or any sign of bleeding, she will need to bring him to the emergency room immediately.  She verbalizes understanding.  I will ask Dr. Darrick Meigs to give him her a call in the morning to check up on patient. ?

## 2021-09-07 DIAGNOSIS — D631 Anemia in chronic kidney disease: Secondary | ICD-10-CM

## 2021-09-07 DIAGNOSIS — K922 Gastrointestinal hemorrhage, unspecified: Secondary | ICD-10-CM | POA: Diagnosis not present

## 2021-09-07 DIAGNOSIS — N183 Chronic kidney disease, stage 3 unspecified: Secondary | ICD-10-CM

## 2021-09-07 LAB — GLUCOSE, CAPILLARY
Glucose-Capillary: 99 mg/dL (ref 70–99)
Glucose-Capillary: 125 mg/dL — ABNORMAL HIGH (ref 70–99)
Glucose-Capillary: 122 mg/dL — ABNORMAL HIGH (ref 70–99)
Glucose-Capillary: 112 mg/dL — ABNORMAL HIGH (ref 70–99)

## 2021-09-07 LAB — CBC WITH DIFFERENTIAL/PLATELET
Abs Immature Granulocytes: 0.07 10*3/uL (ref 0.00–0.07)
Basophils Absolute: 0 10*3/uL (ref 0.0–0.1)
Basophils Relative: 0 %
Eosinophils Absolute: 0 10*3/uL (ref 0.0–0.5)
Eosinophils Relative: 1 %
HCT: 20.8 % — ABNORMAL LOW (ref 39.0–52.0)
Hemoglobin: 6.2 g/dL — CL (ref 13.0–17.0)
Immature Granulocytes: 1 %
Lymphocytes Relative: 18 %
Lymphs Abs: 0.9 10*3/uL (ref 0.7–4.0)
MCH: 19 pg — ABNORMAL LOW (ref 26.0–34.0)
MCHC: 29.8 g/dL — ABNORMAL LOW (ref 30.0–36.0)
MCV: 63.8 fL — ABNORMAL LOW (ref 80.0–100.0)
Monocytes Absolute: 0.5 10*3/uL (ref 0.1–1.0)
Monocytes Relative: 10 %
Neutro Abs: 3.5 10*3/uL (ref 1.7–7.7)
Neutrophils Relative %: 70 %
Platelets: 115 10*3/uL — ABNORMAL LOW (ref 150–400)
RBC: 3.26 MIL/uL — ABNORMAL LOW (ref 4.22–5.81)
RDW: 26.5 % — ABNORMAL HIGH (ref 11.5–15.5)
Smear Review: NORMAL
WBC: 5 10*3/uL (ref 4.0–10.5)
nRBC: 1 % — ABNORMAL HIGH (ref 0.0–0.2)

## 2021-09-07 LAB — PREPARE RBC (CROSSMATCH)

## 2021-09-07 LAB — BASIC METABOLIC PANEL
Anion gap: 8 (ref 5–15)
BUN: 25 mg/dL — ABNORMAL HIGH (ref 8–23)
CO2: 21 mmol/L — ABNORMAL LOW (ref 22–32)
Calcium: 9 mg/dL (ref 8.9–10.3)
Chloride: 108 mmol/L (ref 98–111)
Creatinine, Ser: 2.19 mg/dL — ABNORMAL HIGH (ref 0.61–1.24)
GFR, Estimated: 31 mL/min — ABNORMAL LOW (ref >60)
Glucose, Bld: 97 mg/dL (ref 70–99)
Potassium: 3.6 mmol/L (ref 3.5–5.1)
Sodium: 137 mmol/L (ref 135–145)

## 2021-09-07 LAB — HEMOGLOBIN AND HEMATOCRIT, BLOOD
HCT: 23.4 % — ABNORMAL LOW (ref 39.0–52.0)
Hemoglobin: 7.2 g/dL — ABNORMAL LOW (ref 13.0–17.0)

## 2021-09-07 MED ORDER — BISACODYL 5 MG PO TBEC
10.0000 mg | DELAYED_RELEASE_TABLET | Freq: Once | ORAL | Status: AC
  Administered 2021-09-08: 10 mg via ORAL
  Filled 2021-09-07: qty 2

## 2021-09-07 MED ORDER — SODIUM CHLORIDE 0.9% IV SOLUTION: Freq: Once | INTRAVENOUS | Status: AC

## 2021-09-07 MED ORDER — SODIUM CHLORIDE 0.9 % IV SOLN
125.0000 mg | Freq: Once | INTRAVENOUS | Status: DC
  Filled 2021-09-07: qty 10

## 2021-09-07 MED ORDER — SODIUM CHLORIDE 0.9 % IV SOLN
250.0000 mg | Freq: Once | INTRAVENOUS | Status: AC
  Administered 2021-09-07: 250 mg via INTRAVENOUS
  Filled 2021-09-07: qty 20

## 2021-09-07 MED ORDER — LACTATED RINGERS IV SOLN: INTRAVENOUS | Status: AC

## 2021-09-07 MED ORDER — METOPROLOL SUCCINATE ER 50 MG PO TB24
50.0000 mg | ORAL_TABLET | Freq: Every day | ORAL | Status: DC
  Administered 2021-09-07 – 2021-09-09 (×3): 50 mg via ORAL
  Filled 2021-09-07 (×3): qty 1

## 2021-09-07 NOTE — Progress Notes (Addendum)
[facility] Progress Note ? ?Chief Complaint: Iron deficiency anemia ? ?History: ? ?Transfusions received yesterday without incident.  Patient has no overt GI bleeding, denies abdominal pain or chest pain.  He denies cough or dyspnea, his energy level is improved since transfusion. ?Denies dysuria or hematuria ? ?Objective: ? ? ?Current Facility-Administered Medications:  ?  acetaminophen (TYLENOL) tablet 650 mg, 650 mg, Oral, Q6H PRN **OR** acetaminophen (TYLENOL) suppository 650 mg, 650 mg, Rectal, Q6H PRN, Coy Saunas, Charisse March, MD ?  atorvastatin (LIPITOR) tablet 80 mg, 80 mg, Oral, QHS, Lacinda Axon, MD, 80 mg at 09/06/21 2211 ?  empagliflozin (JARDIANCE) tablet 10 mg, 10 mg, Oral, Daily, Lacinda Axon, MD, 10 mg at 09/07/21 0955 ?  fenofibrate tablet 54 mg, 54 mg, Oral, Daily, Lacinda Axon, MD, 54 mg at 09/07/21 0955 ?  insulin aspart (novoLOG) injection 0-15 Units, 0-15 Units, Subcutaneous, TID WC, Lacinda Axon, MD, 2 Units at 09/07/21 1239 ?  lactated ringers infusion, , Intravenous, Continuous, Lacinda Axon, MD, Last Rate: 100 mL/hr at 09/07/21 1219, New Bag at 09/07/21 1219 ?  levothyroxine (SYNTHROID) tablet 112 mcg, 112 mcg, Oral, QAC breakfast, Lacinda Axon, MD, 112 mcg at 09/07/21 0551 ?  ondansetron (ZOFRAN) tablet 4 mg, 4 mg, Oral, Q6H PRN **OR** ondansetron (ZOFRAN) injection 4 mg, 4 mg, Intravenous, Q6H PRN, Coy Saunas, Charisse March, MD ?  pantoprazole (PROTONIX) injection 40 mg, 40 mg, Intravenous, Q12H, Lacinda Axon, MD, 40 mg at 09/07/21 0955 ?  pregabalin (LYRICA) capsule 75 mg, 75 mg, Oral, QHS, Lacinda Axon, MD, 75 mg at 09/06/21 2211 ?  senna-docusate (Senokot-S) tablet 1 tablet, 1 tablet, Oral, QHS PRN, Lacinda Axon, MD ?  sertraline (ZOLOFT) tablet 100 mg, 100 mg, Oral, Daily, Lacinda Axon, MD, 100 mg at 09/07/21 0955 ?  sodium chloride (OCEAN) 0.65 % nasal spray 1 spray, 1 spray, Each Nare, PRN, Velna Ochs, MD, 1 spray at  09/07/21 0343 ? ? lactated ringers 100 mL/hr at 09/07/21 1219  ?  ? ?Vital signs in last 24 hrs: ?Vitals:  ? 09/07/21 1000 09/07/21 1227  ?BP: (!) 144/79 129/76  ?Pulse: 100 98  ?Resp:  19  ?Temp:  98.1 ?F (36.7 ?C)  ?SpO2: 95% 97%  ? ? ?Intake/Output Summary (Last 24 hours) at 09/07/2021 1322 ?Last data filed at 09/07/2021 1244 ?Gross per 24 hour  ?Intake 1258 ml  ?Output 550 ml  ?Net 708 ml  ? ? ? ?Physical Exam ? ?HEENT: sclera anicteric, oral mucosa without lesions ?Neck: supple, no thyromegaly, JVD or lymphadenopathy ?Cardiac: Irregular without murmurs, S1S2 heard, no peripheral edema ?Pulm: clear to auscultation bilaterally, normal RR and effort noted ?Abdomen: soft, no tenderness, with active bowel sounds. No guarding or palpable hepatosplenomegaly ?Skin; warm and dry, no jaundice.,  Pale ? ?Recent Labs: ? ? ?  Latest Ref Rng & Units 09/07/2021  ?  7:38 AM 09/07/2021  ?  1:31 AM 09/06/2021  ? 11:07 AM  ?CBC  ?WBC 4.0 - 10.5 K/uL  5.0   4.6    ?Hemoglobin 13.0 - 17.0 g/dL 7.2   6.2   4.1    ?Hematocrit 39.0 - 52.0 % 23.4   20.8   16.5    ?Platelets 150 - 400 K/uL  115   PLATELET CLUMPS NOTED ON SMEAR, UNABLE TO ESTIMATE    ? ? ?Recent Labs  ?Lab 09/06/21 ?1415  ?INR 4.4*  ? ? ?  Latest Ref Rng & Units 09/07/2021  ?  1:31 AM 09/06/2021  ? 11:07 AM 09/05/2021  ?  3:21 PM  ?CMP  ?Glucose 70 - 99 mg/dL 97   250   192    ?BUN 8 - 23 mg/dL '25   25   23    '$ ?Creatinine 0.61 - 1.24 mg/dL 2.19   2.40   2.10    ?Sodium 135 - 145 mmol/L 137   138   140    ?Potassium 3.5 - 5.1 mmol/L 3.6   3.8   4.2    ?Chloride 98 - 111 mmol/L 108   108   104    ?CO2 22 - 32 mmol/L '21   24   20    '$ ?Calcium 8.9 - 10.3 mg/dL 9.0   9.3   9.3    ?Total Protein 6.5 - 8.1 g/dL  6.1   6.5    ?Total Bilirubin 0.3 - 1.2 mg/dL  1.0   0.9    ?Alkaline Phos 38 - 126 U/L  29   37    ?AST 15 - 41 U/L  15   12    ?ALT 0 - 44 U/L  9   6    ? ?No INR done today. ? ?Radiologic studies: ? ? ?Assessment & Plan  ?Assessment: ?Anemia of chronic GI blood loss, heme  positive stool ? ?There is also an element of anemia of chronic kidney disease ? ?Hemoglobin 7.2 after transfusions.  He seems to be tolerating this degree of anemia well, especially given the chronicity of his anemia and his presenting hemoglobin of 4.4. ? ?Plan: ?EGD colonoscopy Monday, May 15.  (Available weekend endoscopy/staffing/anesthesia and stable patient) ? ?A.m. CBC and INR tomorrow and the following day ? ?I will leave to the discretion of internal medicine whether he needs another unit of PRBCs.  Either way, he should also receive some IV iron.  (Which I ordered) ? ? ?Nelida Meuse III ?Office: [phone] ? ?

## 2021-09-07 NOTE — Progress Notes (Signed)
Blood transfusion completed without any reaction. 

## 2021-09-07 NOTE — Progress Notes (Signed)
The patient is admitted from ER to 5 W 34. A & O x 4. Denied any acute pain. Full assessment to epic completed. Patient voiced no concern. Will continue to monitor. ?

## 2021-09-07 NOTE — Progress Notes (Signed)
Pt's pants have been soiled with urine since 1700. Nurse has attempted to change pt's pants two times but pt politely declined because wanted to rest some. Wife was at bedside.  ? ?NT tried to change pt during last rounds around 1800 and pt declined again.  ?

## 2021-09-07 NOTE — Progress Notes (Signed)
? ?Subjective:  ? ?Hospital day:1 ? ?Overnight event: Patient received additional unit of PRBCs overnight. ? ?Interim History: Patient evaluated at the bedside laying comfortably in bed.  States he did not sleep well due to all the poking and assessment by nursing staff.  He denies any shortness of breath, chest pain, nausea, dizziness or abdominal pain.  He endorses feeling hungry. ? ?Objective: ? ?Vital signs in last 24 hours: ?Vitals:  ? 09/07/21 0700 09/07/21 0812 09/07/21 1000 09/07/21 1227  ?BP:  136/90 (!) 144/79 129/76  ?Pulse: 96 100 100 98  ?Resp: '14 17  19  '$ ?Temp:  99 ?F (37.2 ?C)  98.1 ?F (36.7 ?C)  ?TempSrc:  Oral  Oral  ?SpO2: 95% 94% 95% 97%  ?Weight:      ?Height:      ? ? ?Filed Weights  ? 09/06/21 1101 09/06/21 2148  ?Weight: 112.3 kg 111.8 kg  ? ? ? ?Intake/Output Summary (Last 24 hours) at 09/07/2021 1417 ?Last data filed at 09/07/2021 1244 ?Gross per 24 hour  ?Intake 1258 ml  ?Output 550 ml  ?Net 708 ml  ? ?Net IO Since Admission: 708 mL [09/07/21 1417] ? ?Recent Labs  ?  09/05/21 ?1534 09/07/21 ?3825 09/07/21 ?1223  ?GLUCAP 185* 99 125*  ?  ? ?Pertinent Labs: ? ?  Latest Ref Rng & Units 09/07/2021  ?  7:38 AM 09/07/2021  ?  1:31 AM 09/06/2021  ? 11:07 AM  ?CBC  ?WBC 4.0 - 10.5 K/uL  5.0   4.6    ?Hemoglobin 13.0 - 17.0 g/dL 7.2   6.2   4.1    ?Hematocrit 39.0 - 52.0 % 23.4   20.8   16.5    ?Platelets 150 - 400 K/uL  115   PLATELET CLUMPS NOTED ON SMEAR, UNABLE TO ESTIMATE    ? ? ? ?  Latest Ref Rng & Units 09/07/2021  ?  1:31 AM 09/06/2021  ? 11:07 AM 09/05/2021  ?  3:21 PM  ?CMP  ?Glucose 70 - 99 mg/dL 97   250   192    ?BUN 8 - 23 mg/dL '25   25   23    '$ ?Creatinine 0.61 - 1.24 mg/dL 2.19   2.40   2.10    ?Sodium 135 - 145 mmol/L 137   138   140    ?Potassium 3.5 - 5.1 mmol/L 3.6   3.8   4.2    ?Chloride 98 - 111 mmol/L 108   108   104    ?CO2 22 - 32 mmol/L '21   24   20    '$ ?Calcium 8.9 - 10.3 mg/dL 9.0   9.3   9.3    ?Total Protein 6.5 - 8.1 g/dL  6.1   6.5    ?Total Bilirubin 0.3 - 1.2 mg/dL  1.0    0.9    ?Alkaline Phos 38 - 126 U/L  29   37    ?AST 15 - 41 U/L  15   12    ?ALT 0 - 44 U/L  9   6    ? ? ?Imaging: ?No results found. ? ?Physical Exam ? ?General: Pleasant, well-appearing elderly man laying in bed. No acute distress. ?HEENT: [place]/AT. Pale conjunctiva. Anicteric sclerae. ?CV: Irregularly irregular rhythm. Regular rate. No m/r/g. No LE edema ?Pulmonary: Lungs CTAB. Normal effort. No wheezing or rales. ?Abdominal: Soft, nontender, nondistended. Normal bowel sounds. ?Extremities: 2+ distal pulses. Normal ROM. ?Skin: Warm and dry. No obvious rash  or lesions. ?Neuro: A&Ox3. Moves all extremities. Normal sensation to gross touch.  ?Psych: Normal mood and affect ? ? ?Assessment/Plan: ?Jonathon [name] is a 71 y.o. male with hx of PUD, HTN, atrial fibrillation on xarelto, HFrEF, hyperlipidemia, type 2 diabetes mellitus, CKD stage III, gout, hypothyroidism, and anxiety who presented per recommendation from PCP for severe anemia and admitted for GI bleed. ? ?Principal Problem: ?  GI bleed ?Active Problems: ?  Essential hypertension ?  CKD (chronic kidney disease) stage 3, GFR 30-59 ml/min (HCC) ?  DM (diabetes mellitus), type 2 with renal complications ([place]) ?  Hypothyroidism ?  Hyperlipidemia ?  Acute kidney injury ([place]) ?  Congestive heart disease ([place]) ?  Atrial fibrillation, chronic ([place]) ?  Diabetic peripheral neuropathy associated with type 2 diabetes mellitus ([place]) ? ?GI bleed, chronic ?Iron deficiency anemia ?Symptomatic anemia ?Presented with a hemoglobin of 4.1. Hemoglobin improved to 6.2 after 3 units PRBCs.  Received additional unit overnight with hemoglobin up to 7.2 this morning. Patient remained hemodynamically stable with SBP in the 120s to 140s. States he feels well overall and just hungry. Denies any hematuria, melena or hematochezia. ?- GI following, appreciate rec ? -Plan for EGD/colonoscopy Monday ?- Daily CBC and INR ?- IV iron, (total deficit 2629 mg) ?- Continue IV pantoprazole 40 q12  hr ?- Transfuse to keep hgb > 7 ?- Continue to hold xarelto and aspirin ?- Avoid NSAIDs ?  ?AKI on CKD, stage 3B ?Presented with a creatinine of 2.4 on admission. Baseline around 1.8-2.0. AKI likely prerenal in the setting of GI bleed. Creatinine improved to 2.19 today. We will provide gentle hydration and monitor closely. ?- IV LR 100 cc/h x 10 hours ?- Daily BMP ?- Avoid nephrotoxic agents ? ?Hypertension ?Patient with soft BPs on admission. BP trending up with SBP in the 120s to 140s. Heart rate in the 90s to 100s. We will resume patient's metoprolol and monitor closely. ?- Resume home metoprolol ?  ?Atrial fibrillation  ?Patient remains in A-fib with HR in the 90s to 100s. ?- Resume home metoprolol ?- Need to hold Xarelto the setting of GI bleed ?  ?HFrEF  ?Last echo in 05/30/2020 showed EF 50-55%, mild LVH ?- Resume metop as above ?- Continue Jardiance  ?  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 CBGs in the 90s to 120s.  ?- Continue home Jardiance and pregabalin ?- SSI w/ meals ?  ?Hypothyroidism: Continue home levothyroxine  ?Anxiety: Continue home sertraline  ?Hyperlipidemia: Continue home atorvastatin and fenofibrate  ? ?Diet: Regular liquids ?IVF: IVLR ?VTE: SCDs ?CODE: Full ? ?Prior to Admission Living Arrangement: Home ?Anticipated Discharge Location: Pending ?Barriers to Discharge: Medical stability ?Dispo: Anticipated discharge in approximately 2-3 day(s).  ? ?Signed: ?Lacinda Axon, MD ?09/07/2021, 2:17 PM  ?Pager: [phone] ?Internal Medicine Teaching Service ?After 5pm on weekdays and 1pm on weekends: On Call pager: [phone] ? ?

## 2021-09-07 NOTE — H&P (View-Only) (Signed)
Patient ID: Jonathon [name], male   DOB: Oct 14, 1950, 71 y.o.   MRN: [id number] ? ? ? Progress Note ? ? Subjective  ? Day # 2 ?CC; profound anemia, heme positive ? ?Xarelto on hold ?Received IV iron ?Xarelto on hold since admission ? ?HGB 4.5>7.2 this am after 1 unit overnight for hemoglobin 6.9 ? ?INR down to 1.9 today ? ? ? ? Objective  ? ?Vital signs in last 24 hours: ?Temp:  [98.5 ?F (36.9 ?C)-99.1 ?F (37.3 ?C)] 99 ?F (37.2 ?C) (05/13 2595) ?Pulse Rate:  [68-109] 100 (05/13 6387) ?Resp:  [12-30] 17 (05/13 5643) ?BP: (98-137)/(64-92) 136/90 (05/13 3295) ?SpO2:  [94 %-100 %] 94 % (05/13 0812) ?Weight:  [111.8 kg-112.3 kg] 111.8 kg (05/12 2148) ?Last BM Date : 09/05/21 ? ? ?Intake/Output from previous day: ?05/12 0701 - 05/13 0700 ?In: 898 [Blood:898] ?Out: 350 [Urine:350] ?Intake/Output this shift: ?Total I/O ?In: -  ?Out: 100 [Urine:100] ? ?Lab Results: ?Recent Labs  ?  09/05/21 ?1521 09/06/21 ?1107 09/07/21 ?0131 09/07/21 ?1884  ?WBC 5.0 4.6 5.0  --   ?HGB 4.5* 4.1* 6.2* 7.2*  ?HCT 18.9* 16.5* 20.8* 23.4*  ?PLT 162 PLATELET CLUMPS NOTED ON SMEAR, UNABLE TO ESTIMATE 115*  --   ? ?BMET ?Recent Labs  ?  09/05/21 ?1521 09/06/21 ?1107 09/07/21 ?0131  ?NA 140 138 137  ?K 4.2 3.8 3.6  ?CL 104 108 108  ?CO2 20 24 21*  ?GLUCOSE 192* 250* 97  ?BUN 23 25* 25*  ?CREATININE 2.10* 2.40* 2.19*  ?CALCIUM 9.3 9.3 9.0  ? ?LFT ?Recent Labs  ?  09/06/21 ?1107  ?PROT 6.1*  ?ALBUMIN 3.4*  ?AST 15  ?ALT 9  ?ALKPHOS 29*  ?BILITOT 1.0  ? ?PT/INR ?Recent Labs  ?  09/06/21 ?1415  ?LABPROT 41.8*  ?INR 4.4*  ? ? ? ? ? ? ? Assessment / Plan:   ? ?#51 71 year old white male with profound anemia, iron deficiency and heme positive stool in setting of chronic Xarelto use. ? ?Patient had significant INR elevation on admission with INR 4.4, down to 1.9 today, more than expected from Xarelto alone ? ?#2 severe fatigue and weakness over the past couple of months secondary to above ?#3 atrial fibrillation, also prior history of DVT ?#4 peripheral  vascular disease ?#5 congestive heart failure EF 40% ?#6 chronic kidney disease stage III ?#7 diabetes ? ? ?Plan; clear liquid diet today, n.p.o. after midnight ?Patient will have bowel prep today ?He is scheduled for EGD and colonoscopy tomorrow with Dr. Bryan Lemma. ?Further GI recommendations pending endoscopic evaluation. ? ? ? ? ? ? ?Principal Problem: ?  GI bleed ?Active Problems: ?  Essential hypertension ?  CKD (chronic kidney disease) stage 3, GFR 30-59 ml/min (HCC) ?  DM (diabetes mellitus), type 2 with renal complications ([place]) ?  Hypothyroidism ?  Hyperlipidemia ?  Acute kidney injury ([place]) ?  Congestive heart disease ([place]) ?  Atrial fibrillation, chronic ([place]) ?  Diabetic peripheral neuropathy associated with type 2 diabetes mellitus ([place]) ? ? ? ? LOS: 1 day  ? ?Arali Somera PA-C 09/07/2021, 9:32 AM ?  ?

## 2021-09-07 NOTE — Progress Notes (Addendum)
Patient ID: Jonathon [name], male   DOB: [birth year], 71 y.o.   MRN: [id number] ? ? ? Progress Note ? ? Subjective  ? Day # 2 ?CC; profound anemia, heme positive ? ?Xarelto on hold ?Received IV iron ?Xarelto on hold since admission ? ?HGB 4.5>7.2 this am after 1 unit overnight for hemoglobin 6.9 ? ?INR down to 1.9 today ? ? ? ? Objective  ? ?Vital signs in last 24 hours: ?Temp:  [98.5 ?F (36.9 ?C)-99.1 ?F (37.3 ?C)] 99 ?F (37.2 ?C) (05/13 0623) ?Pulse Rate:  [68-109] 100 (05/13 7628) ?Resp:  [12-30] 17 (05/13 3151) ?BP: (98-137)/(64-92) 136/90 (05/13 7616) ?SpO2:  [94 %-100 %] 94 % (05/13 0812) ?Weight:  [111.8 kg-112.3 kg] 111.8 kg (05/12 2148) ?Last BM Date : 09/05/21 ? ? ?Intake/Output from previous day: ?05/12 0701 - 05/13 0700 ?In: 898 [Blood:898] ?Out: 350 [Urine:350] ?Intake/Output this shift: ?Total I/O ?In: -  ?Out: 100 [Urine:100] ? ?Lab Results: ?Recent Labs  ?  09/05/21 ?1521 09/06/21 ?1107 09/07/21 ?0131 09/07/21 ?0737  ?WBC 5.0 4.6 5.0  --   ?HGB 4.5* 4.1* 6.2* 7.2*  ?HCT 18.9* 16.5* 20.8* 23.4*  ?PLT 162 PLATELET CLUMPS NOTED ON SMEAR, UNABLE TO ESTIMATE 115*  --   ? ?BMET ?Recent Labs  ?  09/05/21 ?1521 09/06/21 ?1107 09/07/21 ?0131  ?NA 140 138 137  ?K 4.2 3.8 3.6  ?CL 104 108 108  ?CO2 20 24 21*  ?GLUCOSE 192* 250* 97  ?BUN 23 25* 25*  ?CREATININE 2.10* 2.40* 2.19*  ?CALCIUM 9.3 9.3 9.0  ? ?LFT ?Recent Labs  ?  09/06/21 ?1107  ?PROT 6.1*  ?ALBUMIN 3.4*  ?AST 15  ?ALT 9  ?ALKPHOS 29*  ?BILITOT 1.0  ? ?PT/INR ?Recent Labs  ?  09/06/21 ?1415  ?LABPROT 41.8*  ?INR 4.4*  ? ? ? ? ? ? ? Assessment / Plan:   ? ?#72 71 year old white male with profound anemia, iron deficiency and heme positive stool in setting of chronic Xarelto use. ? ?Patient had significant INR elevation on admission with INR 4.4, down to 1.9 today, more than expected from Xarelto alone ? ?#2 severe fatigue and weakness over the past couple of months secondary to above ?#3 atrial fibrillation, also prior history of DVT ?#4 peripheral  vascular disease ?#5 congestive heart failure EF 40% ?#6 chronic kidney disease stage III ?#7 diabetes ? ? ?Plan; clear liquid diet today, n.p.o. after midnight ?Patient will have bowel prep today ?He is scheduled for EGD and colonoscopy tomorrow with Dr. Bryan Lemma. ?Further GI recommendations pending endoscopic evaluation. ? ? ? ? ? ? ?Principal Problem: ?  GI bleed ?Active Problems: ?  Essential hypertension ?  CKD (chronic kidney disease) stage 3, GFR 30-59 ml/min (HCC) ?  DM (diabetes mellitus), type 2 with renal complications ([place]) ?  Hypothyroidism ?  Hyperlipidemia ?  Acute kidney injury ([place]) ?  Congestive heart disease ([place]) ?  Atrial fibrillation, chronic ([place]) ?  Diabetic peripheral neuropathy associated with type 2 diabetes mellitus ([place]) ? ? ? ? LOS: 1 day  ? ?Lezlie Ritchey PA-C 09/07/2021, 9:32 AM ?  ?

## 2021-09-08 LAB — GLUCOSE, CAPILLARY
Glucose-Capillary: 101 mg/dL — ABNORMAL HIGH (ref 70–99)
Glucose-Capillary: 140 mg/dL — ABNORMAL HIGH (ref 70–99)
Glucose-Capillary: 88 mg/dL (ref 70–99)
Glucose-Capillary: 87 mg/dL (ref 70–99)

## 2021-09-08 LAB — PROTIME-INR
INR: 1.9 — ABNORMAL HIGH (ref 0.8–1.2)
Prothrombin Time: 21.7 seconds — ABNORMAL HIGH (ref 11.4–15.2)

## 2021-09-08 LAB — CBC
HCT: 22.1 % — ABNORMAL LOW (ref 39.0–52.0)
HCT: 27.1 % — ABNORMAL LOW (ref 39.0–52.0)
Hemoglobin: 6.9 g/dL — CL (ref 13.0–17.0)
Hemoglobin: 8.4 g/dL — ABNORMAL LOW (ref 13.0–17.0)
MCH: 20.4 pg — ABNORMAL LOW (ref 26.0–34.0)
MCH: 21 pg — ABNORMAL LOW (ref 26.0–34.0)
MCHC: 31.2 g/dL (ref 30.0–36.0)
MCHC: 31 g/dL (ref 30.0–36.0)
MCV: 65.2 fL — ABNORMAL LOW (ref 80.0–100.0)
MCV: 67.8 fL — ABNORMAL LOW (ref 80.0–100.0)
Platelets: 102 10*3/uL — ABNORMAL LOW (ref 150–400)
Platelets: 106 10*3/uL — ABNORMAL LOW (ref 150–400)
RBC: 3.39 MIL/uL — ABNORMAL LOW (ref 4.22–5.81)
RBC: 4 MIL/uL — ABNORMAL LOW (ref 4.22–5.81)
RDW: 28 % — ABNORMAL HIGH (ref 11.5–15.5)
RDW: 28.6 % — ABNORMAL HIGH (ref 11.5–15.5)
WBC: 5.3 10*3/uL (ref 4.0–10.5)
WBC: 5.3 10*3/uL (ref 4.0–10.5)
nRBC: 2.3 % — ABNORMAL HIGH (ref 0.0–0.2)
nRBC: 1.5 % — ABNORMAL HIGH (ref 0.0–0.2)

## 2021-09-08 LAB — BASIC METABOLIC PANEL
Anion gap: 8 (ref 5–15)
BUN: 19 mg/dL (ref 8–23)
CO2: 22 mmol/L (ref 22–32)
Calcium: 8.8 mg/dL — ABNORMAL LOW (ref 8.9–10.3)
Chloride: 107 mmol/L (ref 98–111)
Creatinine, Ser: 1.9 mg/dL — ABNORMAL HIGH (ref 0.61–1.24)
GFR, Estimated: 37 mL/min — ABNORMAL LOW (ref >60)
Glucose, Bld: 84 mg/dL (ref 70–99)
Potassium: 3.3 mmol/L — ABNORMAL LOW (ref 3.5–5.1)
Sodium: 137 mmol/L (ref 135–145)

## 2021-09-08 LAB — PREPARE RBC (CROSSMATCH)

## 2021-09-08 MED ORDER — PEG-KCL-NACL-NASULF-NA ASC-C 100 G PO SOLR: 1.0000 | Freq: Once | ORAL | Status: DC

## 2021-09-08 MED ORDER — PEG-KCL-NACL-NASULF-NA ASC-C 100 G PO SOLR
0.5000 | Freq: Once | ORAL | Status: DC
  Filled 2021-09-08: qty 1

## 2021-09-08 MED ORDER — POTASSIUM CHLORIDE 20 MEQ PO PACK
40.0000 meq | PACK | Freq: Once | ORAL | Status: AC
  Administered 2021-09-08: 40 meq via ORAL
  Filled 2021-09-08: qty 2

## 2021-09-08 MED ORDER — SODIUM CHLORIDE 0.9 % IV SOLN
250.0000 mg | Freq: Once | INTRAVENOUS | Status: AC
  Administered 2021-09-08: 250 mg via INTRAVENOUS
  Filled 2021-09-08: qty 20

## 2021-09-08 MED ORDER — ACETAMINOPHEN 325 MG PO TABS
650.0000 mg | ORAL_TABLET | Freq: Once | ORAL | Status: AC
  Administered 2021-09-08: 650 mg via ORAL

## 2021-09-08 MED ORDER — PEG-KCL-NACL-NASULF-NA ASC-C 100 G PO SOLR
1.0000 | Freq: Once | ORAL | Status: AC
  Administered 2021-09-08: 200 g via ORAL
  Filled 2021-09-08: qty 1

## 2021-09-08 MED ORDER — PEG-KCL-NACL-NASULF-NA ASC-C 100 G PO SOLR
1.0000 | Freq: Once | ORAL | Status: AC
  Administered 2021-09-09: 200 g via ORAL
  Filled 2021-09-08: qty 1

## 2021-09-08 MED ORDER — GUAIFENESIN ER 600 MG PO TB12
600.0000 mg | ORAL_TABLET | Freq: Two times a day (BID) | ORAL | Status: DC | PRN
  Administered 2021-09-08 – 2021-09-10 (×3): 600 mg via ORAL
  Filled 2021-09-08 (×3): qty 1

## 2021-09-08 MED ORDER — SODIUM CHLORIDE 0.9% IV SOLUTION: Freq: Once | INTRAVENOUS | Status: DC

## 2021-09-08 NOTE — TOC Initial Note (Signed)
Transition of Care (TOC) - Initial/Assessment Note  ? ? ?Patient Details  ?Name: Jonathon [name] ?MRN: [id number] ?Date of Birth: 1950-12-11 ? ?Transition of Care (TOC) CM/SW Contact:    ?Verdell Carmine, RN ?Phone Number: ?09/08/2021, 3:53 PM ? ?Clinical Narrative:                 ? ?71 year old patient presented with profound anemia and several months onset of fatigue weakness. He was transfused with several units of blood. He takes xaralto for afib, this is no hold. Iron depleted, will get EGD and colonoscopy tomorrow. ?PT has seen him and recommended OP PT will see if patient progresses  after interventions and hopefully find a etiology for his anemia.  ? ?CM will follow for needs, recommendations and transitions. ?Expected Discharge Plan:  (OP PT) ?Barriers to Discharge: Continued Medical Work up ? ? ?Patient Goals and CMS Choice ?  ?  ?  ? ?Expected Discharge Plan and Services ?Expected Discharge Plan:  (OP PT) ?  ?Discharge Planning Services: CM Consult ?  ?Living arrangements for the past 2 months: [place] ?                ?  ?  ?  ?  ?  ?  ?  ?  ?  ?  ? ?Prior Living Arrangements/Services ?Living arrangements for the past 2 months: [place] ?Lives with:: Spouse ?Patient language and need for interpreter reviewed:: Yes ?       ?Need for Family Participation in Patient Care: Yes (Comment) ?Care giver support system in place?: Yes (comment) ?  ?Criminal Activity/Legal Involvement Pertinent to Current Situation/Hospitalization: No - Comment as needed ? ?Activities of Daily Living ?Home Assistive Devices/Equipment: None ?ADL Screening (condition at time of admission) ?Patient's cognitive ability adequate to safely complete daily activities?: Yes ?Is the patient deaf or have difficulty hearing?: No ?Does the patient have difficulty seeing, even when wearing glasses/contacts?: No ?Does the patient have difficulty concentrating, remembering, or making decisions?: No ?Patient able to express  need for assistance with ADLs?: Yes ?Does the patient have difficulty dressing or bathing?: No ?Independently performs ADLs?: Yes (appropriate for developmental age) ?Does the patient have difficulty walking or climbing stairs?: Yes ?Weakness of Legs: Both ?Weakness of Arms/Hands: None ? ?Permission Sought/Granted ?  ?  ?   ?   ?   ?   ? ?Emotional Assessment ?  ?  ?  ?Orientation: : Oriented to Self, Oriented to Place, Oriented to  Time, Oriented to Situation ?  ?Psych Involvement: No (comment) ? ?Admission diagnosis:  Peripheral edema [R60.9] ?GI bleed [K92.2] ?Acute GI bleeding [K92.2] ?Hyperglycemia [R73.9] ?Symptomatic anemia [D64.9] ?Atrial fibrillation, unspecified type ([place]) [I48.91] ?Patient Active Problem List  ? Diagnosis Date Noted  ? Fatigue, orthostasis 09/06/2021  ? GI bleed 09/06/2021  ? 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 Diabetic peripheral neuropathy associated with type 2 diabetes mellitus ([place]) 07/05/2020  ? Gastroesophageal reflux disease with esophagitis 07/05/2020  ? Insomnia 07/05/2020  ? 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 Major depressive disorder 07/05/2020  ? Health care maintenance 03/27/2020  ? 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 Erectile dysfunction 12/16/2019  ? Fecal incontinence 09/05/2019  ? Acute kidney injury ([place]) 07/15/2019  ? Congestive heart disease ([place]) 07/15/2019  ? Atrial fibrillation, chronic ([place]) 07/15/2019  ? PAD (peripheral artery disease) ([place]) 06/24/2019  ? DVT, lower extremity, distal, acute, right ([place]) 06/09/2019  ? Essential hypertension 06/09/2019  ?  CKD (chronic kidney disease) stage 3, GFR 30-59 ml/min (HCC) 06/09/2019  ? DM (diabetes mellitus), type 2 with renal complications ([place]) 14/44/5848  ? Hypothyroidism 06/09/2019  ? Gout 06/09/2019  ? Hyperlipidemia 06/09/2019  ? GAD (generalized anxiety disorder) 06/09/2019  ? Skin ulcer of third toe of right foot ([place]) 06/08/2019  ? Urge incontinence 10/10/2016  ? ?PCP:  Lajean Manes,  MD ?Pharmacy:   ?[id number] [place], [place] - [address] ?[address] ?[place] [address]?Phone: [phone]: [phone]? ? ? ? ?Social Determinants of Health (SDOH) Interventions ?  ? ?Readmission Risk Interventions ? ?  07/18/2019  ?  2:54 PM  ?Readmission Risk Prevention Plan  ?Transportation Screening Complete  ?PCP or Specialist Appt within 3-5 Days Complete  ?Palliative Care Screening Not Applicable  ? ? ? ?

## 2021-09-08 NOTE — Progress Notes (Signed)
Doctor paged r/t pt to start bld and temp now 100.3 and his hasn't been that high since adm ?

## 2021-09-08 NOTE — Evaluation (Signed)
Occupational Therapy Evaluation ?Patient Details ?Name: Jonathon [name] ?MRN: [id number] ?DOB: 09-20-1950 ?Today's Date: 09/08/2021 ? ? ?History of Present Illness 71 y/o male presented to ED on 09/06/21 from PCP for low Hgb as well as dizziness and weakness. Admitted for severe anemia 2/2 lower GI bleed. PMH: CKD stage IIIb, HTN, Afib, HFrEF, T2DM  ? ?Clinical Impression ?  ?Pt admitted for concerns listed above. PTA pt reported that he was independent with all ADL's and IADLs', however his energy/activity tolerance has been low for several months now. At this time, pt continues to demonstrate mild decline in activity tolerance, as well as some balance deficits. With ambulation this session, pt presents with a forward movement/forward tilt, which he describes as getting ahead of himself, requiring min guard to min A for safety. Due to balance concerns, all standing ADL's require min guard as well. Pt most likely will return to baseline with increased mobility, no follow s/p discharge at this time, OT will continue to follow acutely to address goals.    ?   ? ?Recommendations for follow up therapy are one component of a multi-disciplinary discharge planning process, led by the attending physician.  Recommendations may be updated based on patient status, additional functional criteria and insurance authorization.  ? ?Follow Up Recommendations ? No OT follow up  ?  ?Assistance Recommended at Discharge Set up Supervision/Assistance  ?Patient can return home with the following A little help with walking and/or transfers;A little help with bathing/dressing/bathroom;Assistance with cooking/housework ? ?  ?Functional Status Assessment ? Patient has had a recent decline in their functional status and demonstrates the ability to make significant improvements in function in a reasonable and predictable amount of time.  ?Equipment Recommendations ? None recommended by OT  ?  ?Recommendations for Other Services   ? ? ?   ?Precautions / Restrictions Precautions ?Precautions: Fall ?Restrictions ?Weight Bearing Restrictions: No  ? ?  ? ?Mobility Bed Mobility ?  ?  ?  ?  ?  ?  ?  ?General bed mobility comments: Up in recliner ?  ? ?Transfers ?Overall transfer level: Needs assistance ?Equipment used: None ?Transfers: Sit to/from Stand ?Sit to Stand: Supervision ?  ?  ?  ?  ?  ?General transfer comment: Sup for safety ?  ? ?  ?Balance Overall balance assessment: Needs assistance ?Sitting-balance support: No upper extremity supported, Feet supported ?Sitting balance-Leahy Scale: Good ?  ?  ?Standing balance support: No upper extremity supported, During functional activity ?Standing balance-Leahy Scale: Fair ?Standing balance comment: At times with mobility, pt gets ahead of himself, losing his balance forward. ?  ?  ?  ?  ?  ?  ?  ?  ?  ?  ?  ?   ? ?ADL either performed or assessed with clinical judgement  ? ?ADL Overall ADL's : Needs assistance/impaired ?Eating/Feeding: Independent;Sitting ?  ?Grooming: Supervision/safety;Standing ?  ?Upper Body Bathing: Independent;Sitting ?  ?Lower Body Bathing: Supervison/ safety;Sitting/lateral leans;Sit to/from stand ?  ?Upper Body Dressing : Independent;Sitting ?  ?Lower Body Dressing: Supervision/safety;Sitting/lateral leans;Sit to/from stand ?  ?Toilet Transfer: Min guard;Rolling walker (2 wheels) ?  ?Toileting- Clothing Manipulation and Hygiene: Supervision/safety;Sitting/lateral lean ?  ?  ?  ?Functional mobility during ADLs: Min guard ?General ADL Comments: Pt limited by poor balance and weakness  ? ? ? ?Vision Baseline Vision/History: 1 Wears glasses ?Ability to See in Adequate Light: 0 Adequate ?Patient Visual Report: No change from baseline ?Vision Assessment?: No apparent visual deficits  ?   ?  Perception   ?  ?Praxis   ?  ? ?Pertinent Vitals/Pain Pain Assessment ?Pain Assessment: No/denies pain  ? ? ? ?Hand Dominance Right ?  ?Extremity/Trunk Assessment Upper Extremity Assessment ?Upper  Extremity Assessment: Overall WFL for tasks assessed ?  ?Lower Extremity Assessment ?Lower Extremity Assessment: Defer to PT evaluation ?  ?Cervical / Trunk Assessment ?Cervical / Trunk Assessment: Kyphotic ?  ?Communication Communication ?Communication: No difficulties ?  ?Cognition Arousal/Alertness: Awake/alert ?Behavior During Therapy: [facility] for tasks assessed/performed ?Overall Cognitive Status: No family/caregiver present to determine baseline cognitive functioning ?  ?  ?  ?  ?  ?  ?  ?  ?  ?  ?  ?  ?  ?  ?  ?  ?General Comments: Pt most likely near baseline, follows multistep commands, has good recall of recent events. Has a great sense of humor ?  ?  ?General Comments  VSS on RA ? ?  ?Exercises   ?  ?Shoulder Instructions    ? ? ?Home Living Family/patient expects to be discharged to:: Private residence ?Living Arrangements: Spouse/significant other ?Available Help at Discharge: Family ?Type of Home: House ?Home Access: Stairs to enter ?Entrance Stairs-Number of Steps: 2 steps ?Entrance Stairs-Rails: None ?Home Layout: One level ?  ?  ?Bathroom Shower/Tub: Walk-in shower ?  ?Bathroom Toilet: Handicapped height ?  ?  ?Home Equipment: Cane - single Barista (2 wheels);Shower seat - built in ?  ?  ?  ? ?  ?Prior Functioning/Environment Prior Level of Function : Independent/Modified Independent;Driving ?  ?  ?  ?  ?  ?  ?Mobility Comments: Periodically uses a cane ?ADLs Comments: Indep ?  ? ?  ?  ?OT Problem List: Decreased strength;Decreased activity tolerance;Impaired balance (sitting and/or standing);Decreased safety awareness ?  ?   ?OT Treatment/Interventions: Self-care/ADL training;Therapeutic exercise;Energy conservation;DME and/or AE instruction;Therapeutic activities;Patient/family education;Balance training  ?  ?OT Goals(Current goals can be found in the care plan section) Acute Rehab OT Goals ?Patient Stated Goal: To get more energy ?OT Goal Formulation: With patient ?Time For Goal  Achievement: 09/22/21 ?Potential to Achieve Goals: Good ?ADL Goals ?Pt Will Perform Lower Body Bathing: Independently;sitting/lateral leans;sit to/from stand ?Pt Will Perform Lower Body Dressing: Independently;sit to/from stand;sitting/lateral leans ?Pt Will Transfer to Toilet: Independently;ambulating ?Pt Will Perform Toileting - Clothing Manipulation and hygiene: Independently;sitting/lateral leans;sit to/from stand  ?OT Frequency: Min 2X/week ?  ? ?Co-evaluation   ?  ?  ?  ?  ? ?  ?AM-PAC OT "6 Clicks" Daily Activity     ?Outcome Measure Help from another person eating meals?: None ?Help from another person taking care of personal grooming?: A Little ?Help from another person toileting, which includes using toliet, bedpan, or urinal?: A Little ?Help from another person bathing (including washing, rinsing, drying)?: A Little ?Help from another person to put on and taking off regular upper body clothing?: None ?Help from another person to put on and taking off regular lower body clothing?: A Little ?6 Click Score: 20 ?  ?End of Session Equipment Utilized During Treatment: Gait belt ?Nurse Communication: Mobility status ? ?Activity Tolerance: Patient tolerated treatment well ?Patient left: in chair;with call bell/phone within reach ? ?OT Visit Diagnosis: Unsteadiness on feet (R26.81);Other abnormalities of gait and mobility (R26.89);Muscle weakness (generalized) (M62.81)  ?              ?Time: 6222-9798 ?OT Time Calculation (min): 24 min ?Charges:  OT General Charges ?$OT Visit: 1 Visit ?OT Evaluation ?$OT Eval Moderate  Complexity: 1 Mod ? ?Quinnley Colasurdo H., OTR/L ?Acute Rehabilitation ? ?Jena Tegeler Elane Yolanda Bonine ?09/08/2021, 11:02 AM ?

## 2021-09-08 NOTE — Progress Notes (Addendum)
? ?Subjective: ? ?Patient was given 1 unit pRBCs overnight due to Hb 6.9. This morning he reports nasal congestion and cough with postnasal drip which have been present since admission. Denies dizziness, lightheadedness, chest pain, abdominal pain, or nausea / vomiting.  ? ?Objective: ? ?Vital signs in last 24 hours: ?Vitals:  ? 09/08/21 0507 09/08/21 3570 09/08/21 1779 09/08/21 0801  ?BP:  124/87 132/73 127/89  ?Pulse:  (!) 105  94  ?Resp:  20  17  ?Temp: 100.3 ?F (37.9 ?C) 98.7 ?F (37.1 ?C) 98.5 ?F (36.9 ?C) 98.8 ?F (37.1 ?C)  ?TempSrc: Oral Oral Oral Oral  ?SpO2:  95%  94%  ?Weight:      ?Height:      ? ?Weight change:  ? ?Intake/Output Summary (Last 24 hours) at 09/08/2021 1026 ?Last data filed at 09/08/2021 0425 ?Gross per 24 hour  ?Intake 606.54 ml  ?Output 575 ml  ?Net 31.54 ml  ? ?General: Well appearing and in no acute distress. Lying in bed comfortably ?CV: Irregularly irregular rhythm. Regular rate. No m/r/g. No LE edema. Pulses 2+ in bilateral upper and lower extremities. ?Pulmonary: Lungs CTAB. Normal effort. No wheezing or rales. ?Abdominal: Soft, nontender, nondistended. Normal bowel sounds. ?Skin: Warm and dry. No obvious rash or lesions. ?Neuro: A&Ox3. Moves all extremities. Normal sensation to gross touch.  ?Psych: Normal mood and affect ? ? ?Assessment/Plan: ? ?Principal Problem: ?  GI bleed ?Active Problems: ?  Essential hypertension ?  CKD (chronic kidney disease) stage 3, GFR 30-59 ml/min (HCC) ?  DM (diabetes mellitus), type 2 with renal complications ([place]) ?  Hypothyroidism ?  Hyperlipidemia ?  Acute kidney injury ([place]) ?  Congestive heart disease ([place]) ?  Atrial fibrillation, chronic ([place]) ?  Diabetic peripheral neuropathy associated with type 2 diabetes mellitus ([place]) ? ?Jonathon [name] is a 71 y.o. male with hx of PUD, HTN, atrial fibrillation on xarelto, HFrEF, hyperlipidemia, type 2 diabetes mellitus, CKD stage III, gout, hypothyroidism, and anxiety who presented per recommendation from  PCP for severe anemia and admitted for GI bleed. ? ?Severe symptomatic anemia secondary to GI bleed ?Patient presented from PCP with Hb 4.1, found to have positive FOBT, and admitted for management of GI bleed. Total iron deficit 2629 mg. Denies hematuria, melena, hemoptysis, or hematemesis. Most recent Hb 6.9 after 4 units pRBCs. Received 1 unit pRBC overnight with improvement of hgb to 8.4. Patient is currently asymptomatic from anemia but did have intermittent tachycardia overnight. Vitals have otherwise been within normal limits. PT recommending outpatient PT, no OT follow up.  ?- GI following, appreciate recs ?- EGD and colonoscopy 5/15 ?- Transfuse if Hb <7 ?- S/p IV iron 250 mg 5/13, another dose 5/14 ?- Trend CBC ?- Hold aspirin and xarelto while actively bleeding ?- Avoid NSAIDs ? ?AKI on CKD IIIb -resolved  ?Patient presented with creatinine 2.4 on admission with baseline 1.8-2.0. Creatinine today improved to 1.9 Likely pre-renal in the setting of acute blood loss and initial hypotension. Patient appears euvolemic today and is tolerating PO.  ?- Continue to encourage po intake ?- Trend BMP ?- Avoid nephrotoxic agents ? ?Hypertension ?Patient was initially hypotensive on admission but recent SBPs 120s-130s.  ?- Continue home metoprolol, hold if hypotensive ? ?Atrial fibrillation  ?Patient continues to be in atrial fibrillation with heart rate in 90s.  ?- Continue home metoprolol  ? ?HFrEF  ?Most recent echo in February 2022 with EF 50-55% and mild LVH. ?- Continue home metoprolol and Jardiance  ?  ?  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 CBGs <150 ?- Continue home Jardiance and pregabalin ?- SSI w/ meals ? ?Hypokalemia ?- Replete PRN ? ?Cough ?- Mucinex PRN ?  ?Hypothyroidism ?- Continue home levothyroxine ?  ?Anxiety ?- Continue home sertraline  ? ?Hyperlipidemia ?- Continue home atorvastatin and fenofibrate  ? ? LOS: 2 days  ? ?Halina Andreas, Medical Student ?09/08/2021, 10:26 AM ? ?I have reviewed the note by [name] MS 4 and was present during the interview and physical exam.  I agree with the findings, assessment, and plan. ? ?Lacinda Axon, MD ?09/08/2021, 1:35 PM ?IM Resident, PGY-2 ?Pager: [phone] ?Isaiah 41:10 ? ?

## 2021-09-08 NOTE — Evaluation (Signed)
Physical Therapy Evaluation ?Patient Details ?Name: Jonathon [name] ?MRN: [id number] ?DOB: [birth year] ?Today's Date: 09/08/2021 ? ?History of Present Illness ? 71 y/o male presented to ED on 09/06/21 from PCP for low Hgb as well as dizziness and weakness. Admitted for severe anemia 2/2 lower GI bleed. PMH: CKD stage IIIb, HTN, Afib, HFrEF, T2DM  ?Clinical Impression ? Patient admitted with the above findings. PTA, patient lives with wife and was independent for mobility and basic ADLs, however difficulty with housework and other iADLs due to decreased activity tolerance. Patient currently requiring min guard for ambulation with no AD due to balance deficits. Patient denies dizziness throughout session with VSS on RA. Continues to be limited by decreased activity tolerance. Patient will benefit from skilled PT services during acute stay to address listed deficits. Recommend OPPT at discharge to address balance, activity tolerance, and strength deficits.    ?   ? ?Recommendations for follow up therapy are one component of a multi-disciplinary discharge planning process, led by the attending physician.  Recommendations may be updated based on patient status, additional functional criteria and insurance authorization. ? ?Follow Up Recommendations Outpatient PT ? ?  ?Assistance Recommended at Discharge Intermittent Supervision/Assistance  ?Patient can return home with the following ?   ? ?  ?Equipment Recommendations None recommended by PT  ?Recommendations for Other Services ?    ?  ?Functional Status Assessment Patient has had a recent decline in their functional status and demonstrates the ability to make significant improvements in function in a reasonable and predictable amount of time.  ? ?  ?Precautions / Restrictions Precautions ?Precautions: Fall ?Restrictions ?Weight Bearing Restrictions: No  ? ?  ? ?Mobility ? Bed Mobility ?  ?  ?  ?  ?  ?  ?  ?General bed mobility comments: Up in recliner ?   ? ?Transfers ?Overall transfer level: Needs assistance ?Equipment used: None ?Transfers: Sit to/from Stand ?Sit to Stand: Supervision ?  ?  ?  ?  ?  ?General transfer comment: Sup for safety ?  ? ?Ambulation/Gait ?Ambulation/Gait assistance: Min guard ?Gait Distance (Feet): 200 Feet ?Assistive device: None ?Gait Pattern/deviations: Step-through pattern, Decreased stride length, Drifts right/left, Wide base of support ?Gait velocity: decreased ?  ?  ?General Gait Details: min guard for safety. At times, patient increasing gait speed with worsening balance deficits but no overt LOB. Patient aware of balance deficits at end of ambulation but did not acknowledge during mobility ? ?Stairs ?  ?  ?  ?  ?  ? ?Wheelchair Mobility ?  ? ?Modified Rankin (Stroke Patients Only) ?  ? ?  ? ?Balance Overall balance assessment: Needs assistance ?Sitting-balance support: No upper extremity supported, Feet supported ?Sitting balance-Leahy Scale: Good ?  ?  ?Standing balance support: No upper extremity supported, During functional activity ?Standing balance-Leahy Scale: Fair ?Standing balance comment: At times with mobility, pt gets ahead of himself, losing his balance forward. ?  ?  ?  ?  ?  ?  ?  ?  ?  ?  ?  ?   ? ? ? ?Pertinent Vitals/Pain Pain Assessment ?Pain Assessment: No/denies pain  ? ? ?Home Living Family/patient expects to be discharged to:: Private residence ?Living Arrangements: Spouse/significant other ?Available Help at Discharge: Family ?Type of Home: House ?Home Access: Stairs to enter ?Entrance Stairs-Rails: None ?Entrance Stairs-Number of Steps: 2 steps ?  ?Home Layout: One level ?Home Equipment: Cane - single Barista (2 wheels);Shower seat - built in ?   ?  ?  Prior Function Prior Level of Function : Independent/Modified Independent;Driving ?  ?  ?  ?  ?  ?  ?Mobility Comments: Periodically uses a cane ?ADLs Comments: Indep ?  ? ? ?Hand Dominance  ? Dominant Hand: Right ? ?  ?Extremity/Trunk Assessment   ? Upper Extremity Assessment ?Upper Extremity Assessment: Defer to OT evaluation ?  ? ?Lower Extremity Assessment ?Lower Extremity Assessment: Generalized weakness ?  ? ?Cervical / Trunk Assessment ?Cervical / Trunk Assessment: Kyphotic  ?Communication  ? Communication: No difficulties  ?Cognition Arousal/Alertness: Awake/alert ?Behavior During Therapy: [facility] for tasks assessed/performed ?Overall Cognitive Status: No family/caregiver present to determine baseline cognitive functioning ?  ?  ?  ?  ?  ?  ?  ?  ?  ?  ?  ?  ?  ?  ?  ?  ?General Comments: Pt most likely near baseline, follows multistep commands, has good recall of recent events. Has a great sense of humor ?  ?  ? ?  ?General Comments General comments (skin integrity, edema, etc.): VSS on RA ? ?  ?Exercises    ? ?Assessment/Plan  ?  ?PT Assessment Patient needs continued PT services  ?PT Problem List Decreased strength;Decreased activity tolerance;Decreased balance;Decreased mobility ? ?   ?  ?PT Treatment Interventions DME instruction;Gait training;Therapeutic activities;Therapeutic exercise;Stair training;Functional mobility training;Balance training;Patient/family education   ? ?PT Goals (Current goals can be found in the Care Plan section)  ?Acute Rehab PT Goals ?Patient Stated Goal: to go home ?PT Goal Formulation: With patient ?Time For Goal Achievement: 09/22/21 ?Potential to Achieve Goals: Good ? ?  ?Frequency Min 3X/week ?  ? ? ?Co-evaluation   ?  ?  ?  ?  ? ? ?  ?AM-PAC PT "6 Clicks" Mobility  ?Outcome Measure Help needed turning from your back to your side while in a flat bed without using bedrails?: A Little ?Help needed moving from lying on your back to sitting on the side of a flat bed without using bedrails?: A Little ?Help needed moving to and from a bed to a chair (including a wheelchair)?: A Little ?Help needed standing up from a chair using your arms (e.g., wheelchair or bedside chair)?: A Little ?Help needed to walk in hospital room?: A  Little ?Help needed climbing 3-5 steps with a railing? : A Little ?6 Click Score: 18 ? ?  ?End of Session Equipment Utilized During Treatment: Gait belt ?Activity Tolerance: Patient tolerated treatment well ?Patient left: in chair;with call bell/phone within reach ?Nurse Communication: Mobility status ?PT Visit Diagnosis: Muscle weakness (generalized) (M62.81);Unsteadiness on feet (R26.81) ?  ? ?Time: 7622-6333 ?PT Time Calculation (min) (ACUTE ONLY): 24 min ? ? ?Charges:   PT Evaluation ?$PT Eval Low Complexity: 1 Low ?  ?  ?   ? ? ?Almus Woodham A. Gilford Rile, PT, DPT ?Acute Rehabilitation Services ?Pager [phone] ?Office [phone] ? ? ?Shaunice Levitan A Jessicalynn Deshong ?09/08/2021, 11:24 AM ? ?

## 2021-09-08 NOTE — Progress Notes (Signed)
Pt states he's had a dry nagging congested cough for 3 days now and he would like something for it. ?

## 2021-09-09 ENCOUNTER — Encounter (HOSPITAL_COMMUNITY)
Admission: EM | Disposition: A | Discharge status: Home / Self Care | Payer: Self-pay | Source: Ambulatory Visit | Attending: Internal Medicine

## 2021-09-09 ENCOUNTER — Inpatient Hospital Stay (HOSPITAL_COMMUNITY): Payer: Medicare Other | Admitting: Anesthesiology

## 2021-09-09 ENCOUNTER — Encounter (HOSPITAL_COMMUNITY): Payer: Self-pay | Admitting: Internal Medicine

## 2021-09-09 DIAGNOSIS — K317 Polyp of stomach and duodenum: Secondary | ICD-10-CM

## 2021-09-09 DIAGNOSIS — D122 Benign neoplasm of ascending colon: Secondary | ICD-10-CM

## 2021-09-09 DIAGNOSIS — K635 Polyp of colon: Secondary | ICD-10-CM

## 2021-09-09 DIAGNOSIS — D509 Iron deficiency anemia, unspecified: Secondary | ICD-10-CM

## 2021-09-09 DIAGNOSIS — K2970 Gastritis, unspecified, without bleeding: Secondary | ICD-10-CM

## 2021-09-09 DIAGNOSIS — K222 Esophageal obstruction: Secondary | ICD-10-CM

## 2021-09-09 DIAGNOSIS — K621 Rectal polyp: Secondary | ICD-10-CM

## 2021-09-09 DIAGNOSIS — K922 Gastrointestinal hemorrhage, unspecified: Secondary | ICD-10-CM | POA: Diagnosis not present

## 2021-09-09 DIAGNOSIS — K5730 Diverticulosis of large intestine without perforation or abscess without bleeding: Secondary | ICD-10-CM

## 2021-09-09 HISTORY — PX: POLYPECTOMY: SHX5525

## 2021-09-09 HISTORY — PX: BIOPSY: SHX5522

## 2021-09-09 HISTORY — PX: ESOPHAGOGASTRODUODENOSCOPY (EGD) WITH PROPOFOL: SHX5813

## 2021-09-09 HISTORY — PX: COLONOSCOPY WITH PROPOFOL: SHX5780

## 2021-09-09 LAB — GLUCOSE, CAPILLARY
Glucose-Capillary: 108 mg/dL — ABNORMAL HIGH (ref 70–99)
Glucose-Capillary: 92 mg/dL (ref 70–99)
Glucose-Capillary: 94 mg/dL (ref 70–99)
Glucose-Capillary: 77 mg/dL (ref 70–99)
Glucose-Capillary: 112 mg/dL — ABNORMAL HIGH (ref 70–99)
Glucose-Capillary: 122 mg/dL — ABNORMAL HIGH (ref 70–99)

## 2021-09-09 LAB — BASIC METABOLIC PANEL
Anion gap: 11 (ref 5–15)
BUN: 16 mg/dL (ref 8–23)
CO2: 19 mmol/L — ABNORMAL LOW (ref 22–32)
Calcium: 8.8 mg/dL — ABNORMAL LOW (ref 8.9–10.3)
Chloride: 109 mmol/L (ref 98–111)
Creatinine, Ser: 1.83 mg/dL — ABNORMAL HIGH (ref 0.61–1.24)
GFR, Estimated: 39 mL/min — ABNORMAL LOW (ref >60)
Glucose, Bld: 83 mg/dL (ref 70–99)
Potassium: 3.3 mmol/L — ABNORMAL LOW (ref 3.5–5.1)
Sodium: 139 mmol/L (ref 135–145)

## 2021-09-09 LAB — BPAM RBC
Blood Product Expiration Date: 202305172359
Blood Product Expiration Date: 202305192359
Blood Product Expiration Date: 202305192359
Blood Product Expiration Date: 202305192359
Blood Product Expiration Date: 202306032359
ISSUE DATE / TIME: 202305121256
ISSUE DATE / TIME: 202305121256
ISSUE DATE / TIME: 202305121256
ISSUE DATE / TIME: 202305130331
ISSUE DATE / TIME: 202305140612
Unit Type and Rh: 600
Unit Type and Rh: 600
Unit Type and Rh: 600
Unit Type and Rh: 600
Unit Type and Rh: 600

## 2021-09-09 LAB — POCT I-STAT 7, (LYTES, BLD GAS, ICA,H+H)
Acid-Base Excess: 15 mmol/L — ABNORMAL HIGH (ref 0.0–2.0)
Bicarbonate: 39.3 mmol/L — ABNORMAL HIGH (ref 20.0–28.0)
Calcium, Ion: 1.17 mmol/L (ref 1.15–1.40)
HCT: 23 % — ABNORMAL LOW (ref 39.0–52.0)
Hemoglobin: 7.8 g/dL — ABNORMAL LOW (ref 13.0–17.0)
O2 Saturation: 99 %
Patient temperature: 94.9
Potassium: 2.8 mmol/L — ABNORMAL LOW (ref 3.5–5.1)
Sodium: 135 mmol/L (ref 135–145)
TCO2: 41 mmol/L — ABNORMAL HIGH (ref 22–32)
pCO2 arterial: 44.7 mmHg (ref 32–48)
pH, Arterial: 7.544 — ABNORMAL HIGH (ref 7.35–7.45)
pO2, Arterial: 134 mmHg — ABNORMAL HIGH (ref 83–108)

## 2021-09-09 LAB — TYPE AND SCREEN
ABO/RH(D): A NEG
Antibody Screen: NEGATIVE
Unit division: 0
Unit division: 0
Unit division: 0
Unit division: 0
Unit division: 0

## 2021-09-09 LAB — CBC
HCT: 26.9 % — ABNORMAL LOW (ref 39.0–52.0)
HCT: 28.5 % — ABNORMAL LOW (ref 39.0–52.0)
Hemoglobin: 8 g/dL — ABNORMAL LOW (ref 13.0–17.0)
Hemoglobin: 8.5 g/dL — ABNORMAL LOW (ref 13.0–17.0)
MCH: 20.7 pg — ABNORMAL LOW (ref 26.0–34.0)
MCH: 21.2 pg — ABNORMAL LOW (ref 26.0–34.0)
MCHC: 29.7 g/dL — ABNORMAL LOW (ref 30.0–36.0)
MCHC: 29.8 g/dL — ABNORMAL LOW (ref 30.0–36.0)
MCV: 69.5 fL — ABNORMAL LOW (ref 80.0–100.0)
MCV: 71.1 fL — ABNORMAL LOW (ref 80.0–100.0)
Platelets: 106 10*3/uL — ABNORMAL LOW (ref 150–400)
Platelets: UNDETERMINED 10*3/uL (ref 150–400)
RBC: 3.87 MIL/uL — ABNORMAL LOW (ref 4.22–5.81)
RBC: 4.01 MIL/uL — ABNORMAL LOW (ref 4.22–5.81)
RDW: 29.8 % — ABNORMAL HIGH (ref 11.5–15.5)
RDW: 31.6 % — ABNORMAL HIGH (ref 11.5–15.5)
WBC: 4.7 10*3/uL (ref 4.0–10.5)
WBC: 7.4 10*3/uL (ref 4.0–10.5)
nRBC: 2.4 % — ABNORMAL HIGH (ref 0.0–0.2)
nRBC: 0.7 % — ABNORMAL HIGH (ref 0.0–0.2)

## 2021-09-09 LAB — MAGNESIUM: Magnesium: 1.2 mg/dL — ABNORMAL LOW (ref 1.7–2.4)

## 2021-09-09 LAB — PROTIME-INR
INR: 1.8 — ABNORMAL HIGH (ref 0.8–1.2)
Prothrombin Time: 20.8 seconds — ABNORMAL HIGH (ref 11.4–15.2)

## 2021-09-09 SURGERY — ESOPHAGOGASTRODUODENOSCOPY (EGD) WITH PROPOFOL
Anesthesia: Monitor Anesthesia Care

## 2021-09-09 MED ORDER — SODIUM CHLORIDE 0.9 % IV SOLN
250.0000 mg | Freq: Once | INTRAVENOUS | Status: AC
  Administered 2021-09-09: 250 mg via INTRAVENOUS
  Filled 2021-09-09: qty 20

## 2021-09-09 MED ORDER — LACTATED RINGERS IV SOLN: INTRAVENOUS | Status: DC | PRN

## 2021-09-09 MED ORDER — HYDROMORPHONE HCL 1 MG/ML IJ SOLN: 0.2500 mg | INTRAMUSCULAR | Status: DC | PRN

## 2021-09-09 MED ORDER — PROMETHAZINE HCL 25 MG/ML IJ SOLN: 6.2500 mg | INTRAMUSCULAR | Status: DC | PRN

## 2021-09-09 MED ORDER — MAGNESIUM SULFATE 2 GM/50ML IV SOLN
2.0000 g | Freq: Once | INTRAVENOUS | Status: AC
  Administered 2021-09-09: 2 g via INTRAVENOUS
  Filled 2021-09-09: qty 50

## 2021-09-09 MED ORDER — PROPOFOL 500 MG/50ML IV EMUL
INTRAVENOUS | Status: DC | PRN
  Administered 2021-09-09: 150 ug/kg/min via INTRAVENOUS

## 2021-09-09 MED ORDER — POTASSIUM CHLORIDE 20 MEQ PO PACK
40.0000 meq | PACK | Freq: Once | ORAL | Status: AC
  Administered 2021-09-09: 40 meq via ORAL
  Filled 2021-09-09: qty 2

## 2021-09-09 MED ORDER — PROPOFOL 10 MG/ML IV BOLUS
INTRAVENOUS | Status: DC | PRN
  Administered 2021-09-09: 20 mg via INTRAVENOUS

## 2021-09-09 MED ORDER — MENTHOL 3 MG MT LOZG
1.0000 | LOZENGE | OROMUCOSAL | Status: DC | PRN
  Administered 2021-09-10: 3 mg via ORAL
  Filled 2021-09-09: qty 9

## 2021-09-09 MED ORDER — METOPROLOL TARTRATE 5 MG/5ML IV SOLN
2.5000 mg | Freq: Once | INTRAVENOUS | Status: AC
  Administered 2021-09-09: 2.5 mg via INTRAVENOUS
  Filled 2021-09-09: qty 5

## 2021-09-09 MED ORDER — OXYCODONE HCL 5 MG/5ML PO SOLN: 5.0000 mg | Freq: Once | ORAL | Status: DC | PRN

## 2021-09-09 MED ORDER — OXYCODONE HCL 5 MG PO TABS: 5.0000 mg | ORAL_TABLET | Freq: Once | ORAL | Status: DC | PRN

## 2021-09-09 MED ORDER — METOPROLOL TARTRATE 25 MG PO TABS
25.0000 mg | ORAL_TABLET | Freq: Four times a day (QID) | ORAL | Status: DC
  Administered 2021-09-09 – 2021-09-10 (×2): 25 mg via ORAL
  Filled 2021-09-09 (×2): qty 1

## 2021-09-09 SURGICAL SUPPLY — 25 items

## 2021-09-09 NOTE — Progress Notes (Shared)
? ?  Subjective: ? ?*** ? ?Objective: ? ?Vital signs in last 24 hours: ?Vitals:  ? 09/08/21 1539 09/08/21 2028 09/09/21 0000 09/09/21 0300  ?BP: 138/84 117/80 112/65 117/74  ?Pulse: 96 86 (!) 102 (!) 102  ?Resp: (!) '22 18 20 20  '$ ?Temp: 97.9 ?F (36.6 ?C) 99.6 ?F (37.6 ?C)  98.6 ?F (37 ?C)  ?TempSrc: Oral Axillary  Oral  ?SpO2: 100% 94% 92%   ?Weight:      ?Height:      ? ?Weight change:  ? ?Intake/Output Summary (Last 24 hours) at 09/09/2021 0817 ?Last data filed at 09/08/2021 1030 ?Gross per 24 hour  ?Intake 627 ml  ?Output --  ?Net 627 ml  ? ?General: Well appearing and in no acute distress. Lying in bed comfortably ?CV: Irregularly irregular rhythm. Regular rate. No m/r/g. No LE edema. Pulses 2+ in bilateral upper and lower extremities. ?Pulmonary: Lungs CTAB. Normal effort. No wheezing or rales. ?Abdominal: Soft, nontender, nondistended. Normal bowel sounds. ?Skin: Warm and dry. No obvious rash or lesions. ?Neuro: A&Ox3. Moves all extremities. Normal sensation to gross touch.  ?Psych: Normal mood and affect ? ? ?Assessment/Plan: ? ?Principal Problem: ?  GI bleed ?Active Problems: ?  Essential hypertension ?  CKD (chronic kidney disease) stage 3, GFR 30-59 ml/min (HCC) ?  DM (diabetes mellitus), type 2 with renal complications ([place]) ?  Hypothyroidism ?  Hyperlipidemia ?  Acute kidney injury ([place]) ?  Congestive heart disease ([place]) ?  Atrial fibrillation, chronic ([place]) ?  Diabetic peripheral neuropathy associated with type 2 diabetes mellitus ([place]) ? ? ?*** ? ? LOS: 3 days  ? ?Halina Andreas, Medical Student ?09/09/2021, 8:17 AM ? ?

## 2021-09-09 NOTE — Anesthesia Preprocedure Evaluation (Signed)
Anesthesia Evaluation  ?Patient identified by MRN, date of birth, ID band ?Patient awake ? ? ? ?Reviewed: ?Allergy & Precautions, NPO status , Patient's Chart, lab work & pertinent test results ? ?Airway ?Mallampati: III ? ?TM Distance: >3 FB ?Neck ROM: Full ? ? ? Dental ?no notable dental hx. ? ?  ?Pulmonary ?former smoker,  ?  ?Pulmonary exam normal ?breath sounds clear to auscultation ? ? ? ? ? ? Cardiovascular ?hypertension, Pt. on medications and Pt. on home beta blockers ?+ Peripheral Vascular Disease, +CHF and + DVT  ?+ dysrhythmias Atrial Fibrillation  ?Rhythm:Irregular Rate:Normal ? ?ECG: a-fib, rate 103 ? ?ECHO: ?1. Left ventricular ejection fraction, by estimation, is 35 to 40%. The  ?left ventricle has moderately decreased function. The left ventricle  ?demonstrates global hypokinesis. Unable to assess LV diastolic filling due  ?ot underlying atrial fibrillation.  ??2. Right ventricular systolic function is normal. The right ventricular  ?size is normal. There is normal pulmonary artery systolic pressure.  ??3. Left atrial size was mildly dilated.  ??4. The mitral valve is normal in structure and function. Trivial mitral  ?valve regurgitation. No evidence of mitral stenosis.  ??5. The aortic valve is tricuspid. Aortic valve regurgitation is not  ?visualized. Mild aortic valve sclerosis is present, with no evidence of  ?aortic valve stenosis.  ??6. The inferior vena cava is dilated in size with >50% respiratory  ?variability, suggesting right atrial pressure of 8 mmHg.  ?  ?Neuro/Psych ?PSYCHIATRIC DISORDERS Anxiety Depression  Neuromuscular disease   ? GI/Hepatic ?Neg liver ROS, PUD,   ?Endo/Other  ?diabetes, Type 2, Oral Hypoglycemic AgentsHypothyroidism  ? Renal/GU ?Renal InsufficiencyRenal disease  ? ?  ?Musculoskeletal ?Gout  ? Abdominal ?(+) + obese,   ?Peds ? Hematology ? ?(+) Blood dyscrasia, anemia , HLD  ?Thrombocytopenia    ?Anesthesia Other Findings ? ?  Reproductive/Obstetrics ? ?  ? ? ? ? ? ? ? ? ? ? ? ? ? ?  ?  ? ? ? ? ? ? ? ? ?Anesthesia Physical ? ?Anesthesia Plan ? ?ASA: III ? ?Anesthesia Plan: MAC  ? ?Post-op Pain Management: Minimal or no pain anticipated  ? ?Induction: Intravenous ? ?PONV Risk Score and Plan: 1 and Treatment may vary due to age or medical condition and Propofol infusion ? ?Airway Management Planned: Nasal Cannula ? ?Additional Equipment:  ? ?Intra-op Plan:  ? ?Post-operative Plan:  ? ?Informed Consent: I have reviewed the patients History and Physical, chart, labs and discussed the procedure including the risks, benefits and alternatives for the proposed anesthesia with the patient or authorized representative who has indicated his/her understanding and acceptance.  ? ? ? ?Dental advisory given ? ?Plan Discussed with: CRNA ? ?Anesthesia Plan Comments:   ? ? ? ? ? ? ?Anesthesia Quick Evaluation ? ?

## 2021-09-09 NOTE — Op Note (Signed)
[facility] ?[name] ?Procedure Date : 09/09/2021 ?MRN: [id number] ?Attending MD: Gerrit Heck , MD ?Date of Birth: [birth year] ?CSN: [id number] ?Age: 71 ?Admit Type: Outpatient ?Procedure:                Colonoscopy ?Indications:              Heme positive stool, Iron deficiency anemia ?Providers:                Gerrit Heck, MD, Dulcy Fanny, Charlean Merl  ?                          Purcell Nails, Technician ?Referring MD:              ?Medicines:                Monitored Anesthesia Care ?Complications:            No immediate complications. ?Estimated Blood Loss:     Estimated blood loss was minimal. ?Procedure:                Pre-Anesthesia Assessment: ?                          - Prior to the procedure, a History and Physical  ?                          was performed, and patient medications and  ?                          allergies were reviewed. The patient's tolerance of  ?                          previous anesthesia was also reviewed. The risks  ?                          and benefits of the procedure and the sedation  ?                          options and risks were discussed with the patient.  ?                          All questions were answered, and informed consent  ?                          was obtained. Prior Anticoagulants: The patient has  ?                          taken Xarelto (rivaroxaban), last dose was 4 days  ?                          prior to procedure. ASA Grade Assessment: III - A  ?                          patient with severe systemic disease. After  ?  reviewing the risks and benefits, the patient was  ?                          deemed in satisfactory condition to undergo the  ?                          procedure. ?                          After obtaining informed consent, the colonoscope  ?                          was passed under direct vision. Throughout the  ?                          procedure, the patient's blood  pressure, pulse, and  ?                          oxygen saturations were monitored continuously. The  ?                          [id number]-HQ190L ([id number]) Olympus colonoscope was  ?                          introduced through the anus and advanced to the the  ?                          cecum, identified by appendiceal orifice and  ?                          ileocecal valve. The colonoscopy was performed  ?                          without difficulty. The patient tolerated the  ?                          procedure well. The quality of the bowel  ?                          preparation was good. The ileocecal valve,  ?                          appendiceal orifice, and rectum were photographed. ?Scope In: 9:34:54 AM ?Scope Out: 10:01:17 AM ?Scope Withdrawal Time: 0 hours 23 minutes 32 seconds  ?Total Procedure Duration: 0 hours 26 minutes 23 seconds  ?Findings: ?     The perianal and digital rectal examinations were normal. ?     Nine sessile polyps were found in the transverse colon (4), ascending  ?     colon (3), and cecum (2). The polyps were 3 to 8 mm in size. These  ?     polyps were removed with a cold snare. Resection and retrieval were  ?     complete. Estimated blood loss was minimal. ?     Three sessile polyps were found in the proximal rectum and sigmoid  ?  colon. The polyps were 3 to 8 mm in size (some images did not capture).  ?     These polyps were removed with a cold snare. Resection and retrieval  ?     were complete. Estimated blood loss was minimal. ?     A few small-mouthed diverticula were found in the sigmoid colon. There  ?     was no active bleeding or signs of recent bleeding. ?     The retroflexed view of the distal rectum and anal verge was normal and  ?     showed no anal or rectal abnormalities. ?Impression:               - Nine 3 to 8 mm polyps in the transverse colon, in  ?                          the ascending colon and in the cecum, removed with  ?                          a cold snare.  Resected and retrieved. ?                          - Three 3 to 8 mm polyps in the proximal rectum and  ?                          in the sigmoid colon, removed with a cold snare.  ?                          Resected and retrieved. ?                          - Diverticulosis in the sigmoid colon. ?                          - The distal rectum and anal verge are normal on  ?                          retroflexion view. ?                          - Could not fully intubate the terminal ileum due  ?                          to looping in the ascending colon. Observed the IC  ?                          valve for several minutes, without any blood noted  ?                          coming from the more proximal GI tract. ?Recommendation:           - Return patient to hospital ward for ongoing care. ?                          - Advance diet as tolerated. ?                          -  Continue present medications. ?                          - Await pathology results. ?                          - Resume Xarelto (rivaroxaban) at prior dose  ?                          tomorrow. ?                          - Repeat CBC in 7-10 days as outpatient, and repeat  ?                          CBC and iron panel in 2 months. ?                          - If continued iron deficiency anemia, and provided  ?                          path otherwise unrevealing from EGD and  ?                          colonoscopy, will plan for further small bowel  ?                          interrogation with outpatient Video Capsule  ?                          Endoscopy. ?                          - Return to GI clinic at appointment to be  ?                          scheduled. ?Procedure Code(s):        --- Professional --- ?                          [phone], Colonoscopy, flexible; with removal of  ?                          tumor(s), polyp(s), or other lesion(s) by snare  ?                          technique ?Diagnosis Code(s):        --- Professional --- ?                           K62.1, Rectal polyp ?                          K63.5, Polyp of colon ?                          R19.5, Other fecal abnormalities ?  D50.9, Iron deficiency anemia, unspecified ?                          K57.30, Diverticulosis of large intestine without  ?                          perforation or abscess without bleeding ?CPT copyright 2019 American Medical Association. All rights reserved. ?The codes documented in this report are preliminary and upon coder review may  ?be revised to meet current compliance requirements. ?Gerrit Heck, MD ?09/09/2021 10:28:05 AM ?Number of Addenda: 0 ?

## 2021-09-09 NOTE — Anesthesia Postprocedure Evaluation (Signed)
Anesthesia Post Note ? ?Patient: Alin Chavira ? ?Procedure(s) Performed: ESOPHAGOGASTRODUODENOSCOPY (EGD) WITH PROPOFOL ?COLONOSCOPY WITH PROPOFOL ?BIOPSY ?POLYPECTOMY ? ?  ? ?Patient location during evaluation: PACU ?Anesthesia Type: MAC ?Level of consciousness: awake and alert ?Pain management: pain level controlled ?Vital Signs Assessment: post-procedure vital signs reviewed and stable ?Respiratory status: spontaneous breathing, nonlabored ventilation and respiratory function stable ?Cardiovascular status: blood pressure returned to baseline and stable ?Postop Assessment: no apparent nausea or vomiting ?Anesthetic complications: no ? ? ?No notable events documented. ? ?Last Vitals:  ?Vitals:  ? 09/09/21 1108 09/09/21 1124  ?BP: 128/85 124/80  ?Pulse: (!) 103 (!) 103  ?Resp: (!) 22 17  ?Temp:    ?SpO2: 92% 95%  ?  ?Last Pain:  ?Vitals:  ? 09/09/21 1108  ?TempSrc:   ?PainSc: 0-No pain  ? ? ?  ?  ?  ?  ?  ?  ? ?Lynda Rainwater ? ? ? ? ?

## 2021-09-09 NOTE — Interval H&P Note (Signed)
History and Physical Interval Note: ? ?09/09/2021 ?9:09 AM ? ?[name]  has presented today for surgery, with the diagnosis of Anemia of chronic GI blood loss.  The various methods of treatment have been discussed with the patient and family. After consideration of risks, benefits and other options for treatment, the patient has consented to  Procedure(s): ?ESOPHAGOGASTRODUODENOSCOPY (EGD) WITH PROPOFOL (N/A) ?COLONOSCOPY WITH PROPOFOL (N/A) as a surgical intervention.  The patient's history has been reviewed, patient examined, no change in status, stable for surgery.  I have reviewed the patient's chart and labs.  Questions were answered to the patient's satisfaction.   ? ? ?Jonathon [name] ? ? ?

## 2021-09-09 NOTE — Progress Notes (Signed)
?   09/09/21 1832  ?Assess: MEWS Score  ?Temp 98.6 ?F (37 ?C)  ?BP 136/70  ?Pulse Rate (!) 114  ?ECG Heart Rate (!) 111  ?Resp (!) 24  ?Level of Consciousness Alert  ?SpO2 95 %  ?O2 Device Room Air  ?Assess: MEWS Score  ?MEWS Temp 0  ?MEWS Systolic 0  ?MEWS Pulse 2  ?MEWS RR 1  ?MEWS LOC 0  ?MEWS Score 3  ?MEWS Score Color Yellow  ?Assess: if the MEWS score is Yellow or Red  ?Were vital signs taken at a resting state? Yes  ?Focused Assessment No change from prior assessment  ?Early Detection of Sepsis Score *See Row Information* Low  ?MEWS guidelines implemented *See Row Information* No, vital signs rechecked  ?Treat  ?MEWS Interventions Escalated (See documentation below)  ?Take Vital Signs  ?Increase Vital Sign Frequency  Yellow: Q 2hr X 2 then Q 4hr X 2, if remains yellow, continue Q 4hrs  ?Escalate  ?MEWS: Escalate Yellow: discuss with charge nurse/RN and consider discussing with provider and RRT  ?Notify: Charge Nurse/RN  ?Name of Charge Nurse/RN Notified [place] RN  ?Date Charge Nurse/RN Notified 09/09/21  ?Time Charge Nurse/RN Notified 1855  ?Notify: Provider  ?Provider Name/Title Dr. Sharion Dove ([facility])  ?Date Provider Notified 09/09/21  ?Time Provider Notified [phone]  ?Method of Notification Call  ?Notification Reason Change in status  ?Provider response See new orders  ?Date of Provider Response 09/09/21  ?Time of Provider Response 1855  ?Document  ?Progress note created (see row info) Yes  ? ? ?

## 2021-09-09 NOTE — Transfer of Care (Signed)
Immediate Anesthesia Transfer of Care Note ? ?Patient: Jonathon [name] ? ?Procedure(s) Performed: ESOPHAGOGASTRODUODENOSCOPY (EGD) WITH PROPOFOL ?COLONOSCOPY WITH PROPOFOL ?BIOPSY ?POLYPECTOMY ? ?Patient Location: PACU ? ?Anesthesia Type:MAC ? ?Level of Consciousness: drowsy ? ?Airway & Oxygen Therapy: Patient Spontanous Breathing and Patient connected to face mask oxygen ? ?Post-op Assessment: Report given to RN and Post -op Vital signs reviewed and stable ? ?Post vital signs: Reviewed and stable ? ?Last Vitals:  ?Vitals Value Taken Time  ?BP 130/79 09/09/21 1009  ?Temp 36.6 ?C 09/09/21 1009  ?Pulse 128 09/09/21 1011  ?Resp 20 09/09/21 1011  ?SpO2 98 % 09/09/21 1011  ?Vitals shown include unvalidated device data. ? ?Last Pain:  ?Vitals:  ? 09/09/21 1009  ?TempSrc:   ?PainSc: Asleep  ?   ? ?  ? ?Complications: No notable events documented. ?

## 2021-09-09 NOTE — Progress Notes (Signed)
PT Cancellation Note ? ?Patient Details ?Name: Jonathon [name] ?MRN: [id number] ?DOB: 08-27-1950 ? ? ?Cancelled Treatment:    Reason Eval/Treat Not Completed: Fatigue/lethargy limiting ability to participate ? ?Patient sleeping soundly. ? ? ?Arby Barrette, PT ?Acute Rehabilitation Services  ?Pager [phone] ?Office [phone] ? ? ?[name] ?09/09/2021, 3:36 PM ?

## 2021-09-09 NOTE — Discharge Instructions (Addendum)
Jonathon [name],  ? ?It was a pleasure taking care of you in the hospital and I am glad you are feeling better. You were admitted for severe anemia due to bleeding from your GI tract. We did an endoscopy and a colonoscopy to find the site of bleeding but we did not notice any active bleeding. We found multiple polyps, mild inflammation in your stomach (gastritis), and diverticulosis. Currently, we think your bleeding was either from any of the multiple polyps or from the stomach. It was likely not visible at the time of your endoscopy or colonoscopy because the bleed was so slow. The polyps have been removed and small tissue samples were taken from the stomach and intestine, which are being sent to the lab for analysis (ask about these results when you go to your follow up appointments).  ? ?Although you had both an endoscopy and colonoscopy, there is a section of the small intestine that is too far for either of these procedures to reach and therefore an area that was not imaged. We do currently think the bleeding was likely from your polyps or stomach but if it continues, there is a chance that it could be from the area of your intestines that we were not able to reach. If this is the case, GI will want you to do a capsule endoscopy, which is where you swallow a small camera that takes pictures throughout your GI tract as it goes through.  ? ?To treat the anemia, we gave you five blood transfusions and IV iron. We would like to continue monitoring your hemoglobin in the outpatient setting. If you notice blood in your stool (either red or black stools) we would like you to come back for evaluation. Common symptoms of anemia that would indicate ongoing bleeding would be fatigue, dizziness, lightheadedness, or heart palpitations. If any of these things happen, we would like you to come to the clinic for evaluation.  ? ?Lastly, you had a fever overnight which makes [place] concerned about an infection somewhere in your body.  At this time we are not quite sure where it is coming from but we would like you to check your temperature regularly at home and follow up closely in clinic.  ? ?Medications:  ?Please do not restart tamsulosin but you can restart all of your other medications per your normal schedule today.  ? ?Follow up:  ?- Internal Medicine Clinic within one week to recheck CBC, BMP, and blood pressure.  ?- GI in 4-8 weeks  ?** When you go to these appointments, ask about the hemoglobin and the pathology result (tissue samples and polyps sent to the lab) ?

## 2021-09-09 NOTE — Progress Notes (Signed)
EKG was performed and showed afib RVR. The machine was malfunctioning and did not print or transmit the report. Dr. Rebekah Chesterfield notified of EKG results and that pt is now in yellow mews due to elevated HR.  ?

## 2021-09-09 NOTE — Anesthesia Procedure Notes (Signed)
Procedure Name: [place] ?Date/Time: 09/09/2021 9:12 AM ?Performed by: Lieutenant Diego, CRNA ?Pre-anesthesia Checklist: Patient identified, Emergency Drugs available, Suction available, Patient being monitored and Timeout performed ?Patient Re-evaluated:Patient Re-evaluated prior to induction ?Oxygen Delivery Method: Simple face mask ?Preoxygenation: Pre-oxygenation with 100% oxygen ?Induction Type: IV induction ? ? ? ? ?

## 2021-09-09 NOTE — Progress Notes (Addendum)
? ?Subjective: ? ?No acute events overnight and no complaints this morning. Patient was NPO for endoscopy and colonoscopy today. Denies shortness of breath, dizziness, palpitations, or chest pain.  ? ?Objective: ? ?Vital signs in last 24 hours: ?Vitals:  ? 09/09/21 1124 09/09/21 1139 09/09/21 1154 09/09/21 1225  ?BP: 124/80 124/83 125/80 115/80  ?Pulse: (!) 103 100 (!) 102 99  ?Resp: 17 (!) '22 20 20  '$ ?Temp:   98.6 ?F (37 ?C) 98.5 ?F (36.9 ?C)  ?TempSrc:   Oral Oral  ?SpO2: 95% 95% 97% 93%  ?Weight:      ?Height:      ? ?Weight change:  ? ?Intake/Output Summary (Last 24 hours) at 09/09/2021 1448 ?Last data filed at 09/09/2021 0930 ?Gross per 24 hour  ?Intake 100 ml  ?Output --  ?Net 100 ml  ? ?General: Well appearing and in no acute distress. Lying in bed comfortably ?Neuro: A&Ox3. Moves all extremities. ?Psych: Normal mood and affect ?CV: Irregularly irregular rhythm. Regular rate. No m/r/g. No LE edema. Pulses 2+ in bilateral upper and lower extremities. ?Pulmonary: Lungs CTAB. Normal effort. No wheezing or rales. ?Abdominal: Soft, nontender, nondistended. Normal bowel sounds. ?Skin: Warm and dry. No obvious rash or lesions. ? ? ?Assessment/Plan: ? ?Principal Problem: ?  GI bleed ?Active Problems: ?  Essential hypertension ?  CKD (chronic kidney disease) stage 3, GFR 30-59 ml/min (HCC) ?  DM (diabetes mellitus), type 2 with renal complications ([place]) ?  Hypothyroidism ?  Hyperlipidemia ?  Acute kidney injury ([place]) ?  Congestive heart disease ([place]) ?  Atrial fibrillation ([place]) ?  Diabetic peripheral neuropathy associated with type 2 diabetes mellitus ([place]) ?  Adenomatous polyp of ascending colon ?  Cecal polyp ?  Diverticulosis of colon without hemorrhage ?  Multiple polyps of sigmoid colon ?  Iron deficiency anemia ?  Multiple gastric polyps ?  Gastritis and gastroduodenitis ? ?Jonathon [name] is a 71 y.o. male with hx of PUD, HTN, atrial fibrillation on xarelto, HFrEF, hyperlipidemia, type 2 diabetes mellitus, CKD  stage III, gout, hypothyroidism, and anxiety who presented per recommendation from PCP for severe anemia and admitted for GI bleed, now found to have multiple polyps throughout GI tract, mild gastritis, and diverticulosis.  ? ?Severe symptomatic anemia secondary to GI bleed ?Patient presented from PCP with Hb 4.1, found to have positive FOBT, and admitted for management of GI bleed. Total iron deficit 2629 mg. Endoscopy notable for gastritis, which was biopsied, and multiple polyps in the stomach, which were resected.  Colonoscopy notable for diverticulosis and multiple polyps in transverse colon, ascending colon, cecum, proximal rectum, and sigmoid colon, which were resected. No active bleeding seen on endoscopic evaluation but suspect slow bleeding secondary to polyps or gastritis. Lower concern for bleed in the jejunum / ileum not visualized with endoscopy but will consider if having ongoing bleeding. Currently denies symptoms of anemia but has been tachycardic to 110s throughout the day.  ?- GI following ?- Transfuse if Hb <7 ?- S/p 5 units pRBCs ?- S/p IV iron 250 mg x3  ?- Trend CBC ?- Can restart aspirin and xarelto tomorrow ?- Avoid NSAIDs ?- PT recommending outpatient PT, no OT follow up.  ?  ?AKI on CKD IIIb -resolved  ?Patient presented with creatinine 2.4 on admission with baseline 1.8-2.0. Creatinine today improved to 1.83. Likely pre-renal in the setting of acute blood loss and initial hypotension. Patient appears euvolemic today and is tolerating PO.  ?- Continue to encourage po intake ?- Trend  BMP ?- Avoid nephrotoxic agents ?  ?Hypertension ?Patient was initially hypotensive on admission but recent SBPs 120s-130s.  ?- Continue home metoprolol, hold if hypotensive ?  ?Atrial fibrillation  ?Patient continues to be in atrial fibrillation with heart rate in 110s.  ?- Continue home metoprolol  ?  ?HFrEF  ?Most recent echo in February 2022 with EF 50-55% and mild LVH. ?- Continue home metoprolol and  Jardiance  ?  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 CBGs <150 ?- Continue home Jardiance and pregabalin ?- SSI w/ meals ?  ?Hypokalemia  Hypomagnesemia  ?- Replete PRN ?  ?Cough ?- Mucinex PRN ?- Lozenges ?  ?Hypothyroidism ?- Continue home levothyroxine ?  ?Anxiety ?- Continue home sertraline  ?  ?Hyperlipidemia ?- Continue home atorvastatin and fenofibrate  ? ? LOS: 3 days  ? ?Jonathon [name], Medical Student ?09/09/2021, 2:48 PM ? ?I have reviewed the note by Jonathon Savoy MS 4 and was present during the interview and physical exam.  I agree with the findings, assessment, and plan. ? ?Lacinda Axon, MD ?09/09/2021, 4:15 PM ?[facility] Resident, PGY-2 ?Pager: [phone] ?Isaiah 41:10 ? ?

## 2021-09-09 NOTE — Op Note (Signed)
[facility] ?[name] ?Procedure Date : 09/09/2021 ?MRN: [id number] ?Attending MD: Gerrit Heck , MD ?Date of Birth: 07-Jun-1950 ?CSN: [id number] ?Age: 71 ?Admit Type: Outpatient ?Procedure:                Upper GI endoscopy ?Indications:              Iron deficiency anemia, Heme positive stool,  ?                          Symptomatic anemia ?Providers:                Gerrit Heck, MD, Dulcy Fanny, Charlean Merl  ?                          Purcell Nails, Technician ?Referring MD:              ?Medicines:                Monitored Anesthesia Care ?Complications:            No immediate complications. ?Estimated Blood Loss:     Estimated blood loss was minimal. ?Procedure:                Pre-Anesthesia Assessment: ?                          - Prior to the procedure, a History and Physical  ?                          was performed, and patient medications and  ?                          allergies were reviewed. The patient's tolerance of  ?                          previous anesthesia was also reviewed. The risks  ?                          and benefits of the procedure and the sedation  ?                          options and risks were discussed with the patient.  ?                          All questions were answered, and informed consent  ?                          was obtained. Prior Anticoagulants: The patient has  ?                          taken no previous anticoagulant or antiplatelet  ?                          agents. ASA Grade Assessment: III - A patient with  ?                          severe  systemic disease. After reviewing the risks  ?                          and benefits, the patient was deemed in  ?                          satisfactory condition to undergo the procedure. ?                          After obtaining informed consent, the endoscope was  ?                          passed under direct vision. Throughout the  ?                          procedure, the  patient's blood pressure, pulse, and  ?                          oxygen saturations were monitored continuously. The  ?                          GIF-H190 ([id number]) Olympus endoscope was introduced  ?                          through the mouth, and advanced to the second part  ?                          of duodenum. The upper GI endoscopy was  ?                          accomplished without difficulty. The patient  ?                          tolerated the procedure well. ?Scope In: ?Scope Out: ?Findings: ?     A non-obstructing Schatzki ring was found in the lower third of the  ?     esophagus. This was easily traversed. ?     The esophagus was otherwise normal. ?     Multiple 3 to 8 mm sessile polyps with no bleeding were found in the  ?     gastric fundus and in the gastric body. Several of these polyps,  ?     including all polyps >5 mm, were removed with a cold snare. Resection  ?     and retrieval were complete. Estimated blood loss was minimal. ?     Localized mild inflammation characterized by congestion (edema) and  ?     erythema was found in the prepyloric region of the stomach. Biopsies  ?     were taken with a cold forceps for histology. Estimated blood loss was  ?     minimal. ?     The examined duodenum was normal. Biopsies were taken with a cold  ?     forceps for histology. Estimated blood loss was minimal. ?Impression:               - Non-obstructing Schatzki ring. ?                          -  Normal esophagus. ?                          - Multiple gastric polyps. Resected and retrieved. ?                          - Gastritis. Biopsied. ?                          - Normal examined duodenum. Biopsied. ?Recommendation:           - Await pathology results. ?                          - Perform a colonoscopy today. Additional  ?                          recommendations pending colonoscopy findings. ?Procedure Code(s):        --- Professional --- ?                          [phone],  Esophagogastroduodenoscopy, flexible,  ?                          transoral; with removal of tumor(s), polyp(s), or  ?                          other lesion(s) by snare technique ?                          43239, 59, Esophagogastroduodenoscopy, flexible,  ?                          transoral; with biopsy, single or multiple ?Diagnosis Code(s):        --- Professional --- ?                          K22.2, Esophageal obstruction ?                          K31.7, Polyp of stomach and duodenum ?                          K29.70, Gastritis, unspecified, without bleeding ?                          D50.9, Iron deficiency anemia, unspecified ?                          R19.5, Other fecal abnormalities ?CPT copyright 2019 American Medical Association. All rights reserved. ?The codes documented in this report are preliminary and upon coder review may  ?be revised to meet current compliance requirements. ?Gerrit Heck, MD ?09/09/2021 10:19:01 AM ?Number of Addenda: 0 ?

## 2021-09-09 NOTE — Progress Notes (Signed)
Pt taken down to endo.  ?

## 2021-09-10 ENCOUNTER — Encounter (HOSPITAL_COMMUNITY): Payer: Self-pay | Admitting: Gastroenterology

## 2021-09-10 DIAGNOSIS — D122 Benign neoplasm of ascending colon: Secondary | ICD-10-CM | POA: Diagnosis not present

## 2021-09-10 DIAGNOSIS — K922 Gastrointestinal hemorrhage, unspecified: Secondary | ICD-10-CM | POA: Diagnosis not present

## 2021-09-10 DIAGNOSIS — I4811 Longstanding persistent atrial fibrillation: Secondary | ICD-10-CM | POA: Diagnosis not present

## 2021-09-10 LAB — MAGNESIUM: Magnesium: 1.6 mg/dL — ABNORMAL LOW (ref 1.7–2.4)

## 2021-09-10 LAB — CBC
HCT: 30 % — ABNORMAL LOW (ref 39.0–52.0)
Hemoglobin: 8.7 g/dL — ABNORMAL LOW (ref 13.0–17.0)
MCH: 21.1 pg — ABNORMAL LOW (ref 26.0–34.0)
MCHC: 29 g/dL — ABNORMAL LOW (ref 30.0–36.0)
MCV: 72.8 fL — ABNORMAL LOW (ref 80.0–100.0)
Platelets: 109 10*3/uL — ABNORMAL LOW (ref 150–400)
RBC: 4.12 MIL/uL — ABNORMAL LOW (ref 4.22–5.81)
RDW: 32.2 % — ABNORMAL HIGH (ref 11.5–15.5)
WBC: 7.7 10*3/uL (ref 4.0–10.5)
nRBC: 0.5 % — ABNORMAL HIGH (ref 0.0–0.2)

## 2021-09-10 LAB — RESPIRATORY PANEL BY PCR

## 2021-09-10 LAB — BASIC METABOLIC PANEL
Anion gap: 9 (ref 5–15)
BUN: 14 mg/dL (ref 8–23)
CO2: 19 mmol/L — ABNORMAL LOW (ref 22–32)
Calcium: 8.7 mg/dL — ABNORMAL LOW (ref 8.9–10.3)
Chloride: 110 mmol/L (ref 98–111)
Creatinine, Ser: 1.98 mg/dL — ABNORMAL HIGH (ref 0.61–1.24)
GFR, Estimated: 35 mL/min — ABNORMAL LOW (ref >60)
Glucose, Bld: 142 mg/dL — ABNORMAL HIGH (ref 70–99)
Potassium: 3.4 mmol/L — ABNORMAL LOW (ref 3.5–5.1)
Sodium: 138 mmol/L (ref 135–145)

## 2021-09-10 LAB — GLUCOSE, CAPILLARY
Glucose-Capillary: 107 mg/dL — ABNORMAL HIGH (ref 70–99)
Glucose-Capillary: 151 mg/dL — ABNORMAL HIGH (ref 70–99)

## 2021-09-10 LAB — SURGICAL PATHOLOGY

## 2021-09-10 MED ORDER — ASPIRIN EC 81 MG PO TBEC
81.0000 mg | DELAYED_RELEASE_TABLET | Freq: Every day | ORAL | Status: DC
  Administered 2021-09-10: 81 mg via ORAL
  Filled 2021-09-10: qty 1

## 2021-09-10 MED ORDER — MAGNESIUM SULFATE 2 GM/50ML IV SOLN
2.0000 g | Freq: Once | INTRAVENOUS | Status: AC
  Administered 2021-09-10: 2 g via INTRAVENOUS
  Filled 2021-09-10: qty 50

## 2021-09-10 MED ORDER — RIVAROXABAN 20 MG PO TABS
20.0000 mg | ORAL_TABLET | Freq: Every day | ORAL | Status: DC
  Administered 2021-09-10: 20 mg via ORAL
  Filled 2021-09-10: qty 1

## 2021-09-10 MED ORDER — METOPROLOL SUCCINATE ER 50 MG PO TB24: 50.0000 mg | ORAL_TABLET | Freq: Every day | ORAL | Status: DC

## 2021-09-10 MED ORDER — POTASSIUM CHLORIDE 20 MEQ PO PACK
40.0000 meq | PACK | Freq: Once | ORAL | Status: AC
  Administered 2021-09-10: 40 meq via ORAL
  Filled 2021-09-10: qty 2

## 2021-09-10 MED ORDER — METOPROLOL SUCCINATE ER 50 MG PO TB24
50.0000 mg | ORAL_TABLET | Freq: Every day | ORAL | Status: DC
Start: 2021-09-10 — End: 2021-09-10
  Administered 2021-09-10: 50 mg via ORAL
  Filled 2021-09-10: qty 1

## 2021-09-10 NOTE — Progress Notes (Addendum)
? ? Attending physician's note  ? ?I have reviewed the chart, and discussed his care on rounds. I performed a substantive portion of this encounter, including complete performance of at least one of the key components, in conjunction with the APP. I agree with the APP's note, impression, and recommendations with my edits.  ? ?Tinnie Gens ?[phone] office  ? ?   ? ?       ? ? Daily Rounding Note ? ?09/10/2021, 8:12 AM ? LOS: 4 days  ? ?SUBJECTIVE:   ?Chief complaint:    Profound anemia ? ?Denies dizziness, shortness of breath, weakness when getting up to the bathroom last night.  Has not been up out of bed today.  Stool brown, somewhat loose since procedures yesterday. ? ?OBJECTIVE:        ? Vital signs in last 24 hours:    ?Temp:  [97.9 ?F (36.6 ?C)-101.2 ?F (38.4 ?C)] 97.9 ?F (36.6 ?C) (05/16 0750) ?Pulse Rate:  [77-130] 89 (05/16 0750) ?Resp:  [17-24] 17 (05/16 0750) ?BP: (86-152)/(65-99) 114/85 (05/16 0750) ?SpO2:  [91 %-99 %] 95 % (05/16 0750) ?Weight:  [111.8 kg] 111.8 kg (05/15 0832) ?Last BM Date : 09/09/21 ?Filed Weights  ? 09/06/21 1101 09/06/21 2148 09/09/21 0832  ?Weight: 112.3 kg 111.8 kg 111.8 kg  ? ?General: Looks somewhat chronically unwell.  Overweight.  Alert. ?Heart: RRR. ?Chest: Diminished breath sounds, poor inspiratory effort but no cough or labored breathing. ?Abdomen: Soft without tenderness.  Active bowel sounds. ?Extremities: 1-2+ pitting pedal edema. ?Neuro/Psych: Oriented x3.  Pleasant affect.  Moves all 4 limbs without tremor. ? ?Intake/Output from previous day: ?05/15 0701 - 05/16 0700 ?In: 73 [P.O.:120; I.V.:100; IV Piggyback:250] ?Out: -  ? ?Intake/Output this shift: ?No intake/output data recorded. ? ?Lab Results: ?Recent Labs  ?  09/09/21 ?0202 09/09/21 ?1830 09/10/21 ?0121  ?WBC 4.7 7.4 7.7  ?HGB 8.0* 8.5* 8.7*  ?HCT 26.9* 28.5* 30.0*  ?PLT 106* PLATELET CLUMPS NOTED ON SMEAR, UNABLE TO ESTIMATE 109*   ? ?BMET ?Recent Labs  ?  09/08/21 ?0036 09/08/21 ?1225 09/09/21 ?0202 09/10/21 ?0121  ?NA 137 135 139 138  ?K 3.3* 2.8* 3.3* 3.4*  ?CL 107  --  109 110  ?CO2 22  --  19* 19*  ?GLUCOSE 84  --  83 142*  ?BUN 19  --  16 14  ?CREATININE 1.90*  --  1.83* 1.98*  ?CALCIUM 8.8*  --  8.8* 8.7*  ? ?LFT ?No results for input(s): PROT, ALBUMIN, AST, ALT, ALKPHOS, BILITOT, BILIDIR, IBILI in the last 72 hours. ?PT/INR ?Recent Labs  ?  09/08/21 ?0036 09/09/21 ?0202  ?LABPROT 21.7* 20.8*  ?INR 1.9* 1.8*  ? ?Hepatitis Panel ?No results for input(s): HEPBSAG, HCVAB, HEPAIGM, HEPBIGM in the last 72 hours. ? ?Studies/Results: ?No results found. ? ?ASSESMENT:  ? ?  Profound IDA anemia, FOBT + stool. Hx PRBCs in 2014 and 2017.  09/09/21 EGD: schatski's ring.  Numerous non-bleeding, benign appearing gastric polyps, several removed.  Pre-pyloric gastritis, path pndg. Examined duodenum normal.  09/09/21 Colonoscopy: 12 polyps sized 3 to 8 mm removed from throughout colon/cecum.  Siigmoid tics.  Incomplete intubation/visulization of TI due to looping of colon.  No active bleeding or old blood observed.  Hgb 4.1 .[name] 5 PRBC.. 8.7.  Ferrlecit x 2.   ? ?  Chronic Xarelto for hx DVT.  On hold.  INR peak 4.4 higher than expected from Xarelto alone.   ? ?  Thrombocytopenia.  Dates to  at least 05/2019.  Borderline splenomegaly, diffusely echogenic liver on 06/2012 [place] at [place].  Small liver cyst on CT 08/2012.     ? ?  Stage 3 CKD.   ? ? ?PLAN  ? ?  Resume Xarelto today.   ? ?  CBC in 7 to 10 d from discharge to assure improving.  Iron panel in 8 weeks.  If ongoing iron deficit, consider VCE.   ? ?  Sent request to Dr Loletha Carrow' office to arrange fup appt in 4 to 8 weeks.  Office will contact pt w dets.   ? ?Instructed patient that he needs to start observing his bowel movements and that, given he is restarting Xarelto and had multiple polyps removed, he can develop bleeding from polypectomy sites and if he sees significant change in his stools towards  melena or blood, he needs to return to the ED.  Obviously also needs to return to the ED if he starts to have anemia symptoms again ? ? ? ?Azucena Freed  09/10/2021, 8:12 AM ?Phone [phone]  ?

## 2021-09-10 NOTE — Discharge Summary (Signed)
? ?Name: Jonathon [name] ?MRN: [id number] ?DOB: [birth year] 71 y.o. ?PCP: Lajean Manes, MD ? ?Date of Admission: 09/06/2021 10:56 AM ?Date of Discharge: 09/10/2021 ?Attending Physician: Dr.  Cain Sieve ? ?Discharge Diagnosis: ?Principal Problem: ?  Acute GI bleeding ?Active Problems: ?  Essential hypertension ?  CKD (chronic kidney disease) stage 3, GFR 30-59 ml/min (HCC) ?  DM (diabetes mellitus), type 2 with renal complications ([place]) ?  Hypothyroidism ?  Hyperlipidemia ?  Acute kidney injury ([place]) ?  Congestive heart disease ([place]) ?  Atrial fibrillation ([place]) ?  Diabetic peripheral neuropathy associated with type 2 diabetes mellitus ([place]) ?  Adenomatous polyp of ascending colon ?  Cecal polyp ?  Diverticulosis of colon without hemorrhage ?  Multiple polyps of sigmoid colon ?  Iron deficiency anemia ?  Multiple gastric polyps ?  Gastritis and gastroduodenitis ?  ? ?Discharge Medications: ?Allergies as of 09/10/2021   ? ?   Reactions  ? Morphine And Related Itching  ? ?  ? ?  ?Medication List  ?  ? ?STOP taking these medications   ? ?tamsulosin 0.4 MG Caps capsule ?Commonly known as: FLOMAX ?  ? ?  ? ?TAKE these medications   ? ?aspirin 81 MG EC tablet ?Take 1 tablet (81 mg total) by mouth daily. ?  ?atorvastatin 80 MG tablet ?Commonly known as: LIPITOR ?Take 1 tablet (80 mg total) by mouth at bedtime. ?  ?fenofibrate 54 MG tablet ?TAKE 1 TABLET(54 MG) BY MOUTH DAILY ?What changed: See the new instructions. ?  ?Jardiance 10 MG Tabs tablet ?Generic drug: empagliflozin ?TAKE 1 TABLET BY MOUTH EVERY DAY ?What changed: how much to take ?  ?levothyroxine 112 MCG tablet ?Commonly known as: SYNTHROID ?TAKE 1 TABLET (112 MCG TOTAL) BY MOUTH AT BEDTIME. ?  ?metoprolol succinate 50 MG 24 hr tablet ?Commonly known as: TOPROL-XL ?Take 50 mg by mouth daily. Take with or immediately following a meal. ?What changed: Another medication with the same name was removed. Continue taking this medication, and follow the directions you see  here. ?  ?pantoprazole 40 MG tablet ?Commonly known as: PROTONIX ?TAKE 1 TABLET BY MOUTH TWICE A DAY ?What changed:  ?how much to take ?when to take this ?  ?pregabalin 75 MG capsule ?Commonly known as: LYRICA ?Take 2 capsules (150 mg total) by mouth at bedtime. ?What changed: how much to take ?  ?sertraline 100 MG tablet ?Commonly known as: ZOLOFT ?TAKE 1 TABLET BY MOUTH EVERY DAY ?  ?Xarelto 20 MG Tabs tablet ?Generic drug: rivaroxaban ?TAKE 1 TABLET BY MOUTH EVERY DAY ?What changed: how much to take ?  ? ?  ? ? ?Disposition and follow-up:   ?Mr.Cranford Blessinger was discharged from [facility] in Stable condition.  At the hospital follow up visit please address: ? ?1.  Follow-up: ? a. Symptomatic anemia/Chronic GI bleed: Presented w/ hgb 4.1. Discharged with hgb of 8.7 after receiving 5 units pRBC and IV iron. Had colonoscopy and EGD. Please evaluate for any symptoms, check CBC and follow up on surgical pathology results. ?  ? b. Fever: Had a temp of 101.2 night before discharge with some URI symptoms. Respiratory panel came back positive for parainfluenza virus 3 after he was discharged. Pt was called, informed of results and advised to try OTC cold and cough meds. Please check vitals and reassess symptoms.  ? ? c. Afib: Patient had one episode of afib w/ RVR, rates in the 160s. Resolved by day of discharge. Ensure patient's HR  is stable and he is taking his medications as prescribed.  ? ? ?2.  Labs / imaging needed at time of follow-up: CBC, Mag, BMP ? ?3.  Pending labs/ test needing follow-up: Surg path results from endoscopy and colonoscopy  ? ?Follow-up Appointments: ?Appointment with [facility] on Friday 5/19 ? ?Hospital Course by problem list: ?This is a 71 y.o. male with hx of PUD, HTN, atrial fibrillation on xarelto, HFrEF, hyperlipidemia, type 2 diabetes mellitus, CKD stage III, gout, hypothyroidism, and anxiety who presented per recommendation from PCP for severe anemia and admitted for GI  bleed, found to have multiple polyps, gastritis, and diverticulosis.  ? ?Severe symptomatic anemia secondary to GI bleed ?Patient presented from PCP with Hb 4.1, found to have positive FOBT, and admitted for management of GI bleed. Total iron deficit 2629 mg. Endoscopy notable for gastritis, which was biopsied, and multiple polyps in the stomach, which were resected. Colonoscopy notable for diverticulosis and multiple polyps in transverse colon, ascending colon, cecum, proximal rectum, and sigmoid colon, which were resected. No active bleeding seen on endoscopic evaluation but suspect slow bleeding secondary to polyps or gastritis. Lower concern for bleed in the jejunum / ileum not visualized with endoscopy but will consider if having ongoing bleeding with outpatient capsule endoscopy. He received a total of 5 units pRBCs and 750 mg IV iron. At the time of discharge, his hemoglobin had remained stable at 8.7. Given the extent of his iron deficiency, he will likely need to be on PO iron outpatient but will defer to PCP.  ? ?Fever, likely secondary to viral respiratory infection  ?Patient had been having ongoing postnasal drainage with cough throughout admission, treated with mucinex and lozenges. Overnight after endoscopy/colonoscopy, he spiked a fever to 101.2 with accompanying tachycardia to 130, tachypnea to 24, and hypotension to 86/74. He continued to oxygenate well and had no leukocytosis.Vitals normalized the following morning. Diffuse wheezing heard on exam but no signs of increased work of breathing or focal crackles. Abdominal exam benign. Patient denied any abdominal or urinary symptoms. Fever thought to be secondary to developing viral respiratory tract infection. Superimposed bacterial pneumonia with viral illness unlikely given clinical improvement and lack of focal crackles on exam. Aspiration pneumonia considered in the setting of recent sedation for endoscopy/colonoscopy but timeline would indicate  aspiration pneumonitis more than aspiration pneumonia. Bowel perforation also considered due to recent endoscopic evaluation but no abnormalities on abdominal exam and patient tolerating PO well. Due to patient's strong desire to go home, close support system, and hemodynamic stability the following morning, the decision was made to discharge with close outpatient monitoring.  ? ?AKI on CKD IIIb - resolved  ?Patient presented with creatinine 2.4 on admission with baseline 1.8-2.0. AKI thought to be pre-renal in the setting of acute blood loss and initial hypotension. Creatinine improved throughout admission with blood transfusions and IVF.  Creatinine within baseline range at the time of discharge.  ? ?HTN ?Patient was initially hypotensive on admission so home metoprolol was held but SBPs subsequently normalized to 120s-130s and metoprolol was restarted.  ? ?Atrial fibrillation ?While admitted, patient had an episode of RVR, for which metoprolol was temporarily increased but he was resumed on his home regimen at the time of discharge.   ? ?Hypokalemia  Hypomagnesemia ?Repleted PRN ?  ?Subjective: ?This morning, all vitals have normalized and patient expresses strong desire to be discharged. He endorses a cough and nasal congestion which he has had over the past several days but denies any  shortness of breath or chest pain. He has continued to oxygenate well on room air. Tolerating PO and ambulating with assistance. Denies abdominal pain, nausea, vomiting, diarrhea, or constipation.  ?----------------------------- ? ?Discharge Vitals:   ?BP 117/73 (BP Location: Left Arm)   Pulse 81   Temp 97.8 ?F (36.6 ?C) (Oral)   Resp 17   Ht 6' (1.829 m)   Wt 111.8 kg   SpO2 97%   BMI 33.43 kg/m?  ?General: Well appearing and in no acute distress. Lying in bed comfortably. ?Neuro: A&O x3, normal affect ?HEENT: Normocephalic, atraumatic, EOMI, normal sclerae without scleral icterus or injection. ?Cardiovascular:  Irregularly irregular rhythm with normal rate. Pulses 2+ in bilateral UE and LE. No peripheral edema ?Pulmonary: Normal work of breathing without use of accessory muscles. Mild diffuse inspiratory and expiratory wheezing hear

## 2021-09-10 NOTE — Progress Notes (Signed)
Physical Therapy Treatment ?Patient Details ?Name: Jonathon [name] ?MRN: [id number] ?DOB: 04/08/1951 ?Today's Date: 09/10/2021 ? ? ?History of Present Illness 71 y/o male presented to ED on 09/06/21 from PCP for low Hgb as well as dizziness and weakness. Admitted for severe anemia 2/2 lower GI bleed. PMH: CKD stage IIIb, HTN, Afib, HFrEF, T2DM ? ?  ?PT Comments  ? ? Patient sleeping on arrival, but easily awakened and agreeable to PT. Patient had been incontinent of B/B while sleeping and assisted to [facility] to complete using the restroom and assist with pericare. Patient then progressed to ambulation. He walks slowly, but steady x 200 ft with no device.  ?   ?Recommendations for follow up therapy are one component of a multi-disciplinary discharge planning process, led by the attending physician.  Recommendations may be updated based on patient status, additional functional criteria and insurance authorization. ? ?Follow Up Recommendations ? Outpatient PT ?  ?  ?Assistance Recommended at Discharge Intermittent Supervision/Assistance  ?Patient can return home with the following A little help with walking and/or transfers;Help with stairs or ramp for entrance ?  ?Equipment Recommendations ? None recommended by PT  ?  ?Recommendations for Other Services   ? ? ?  ?Precautions / Restrictions Precautions ?Precautions: Fall ?Restrictions ?Weight Bearing Restrictions: No  ?  ? ?Mobility ? Bed Mobility ?Overal bed mobility: Needs Assistance ?Bed Mobility: Sidelying to Sit, Sit to Supine ?  ?Sidelying to sit: Min assist ?  ?Sit to supine: Supervision ?  ?General bed mobility comments: reaches for UE support/asssist when coming up to sit; supervision for lines and safety ?  ? ?Transfers ?Overall transfer level: Needs assistance ?Equipment used: None ?Transfers: Sit to/from Stand ?Sit to Stand: Supervision ?  ?  ?  ?  ?  ?General transfer comment: Sup for safety; x3 reps with use of UEs ?  ? ?Ambulation/Gait ?Ambulation/Gait  assistance: Min guard ?Gait Distance (Feet): 200 Feet ?Assistive device: None ?Gait Pattern/deviations: Step-through pattern, Decreased stride length, Drifts right/left, Wide base of support ?Gait velocity: decreased ?  ?  ?General Gait Details: poor bil foot clearance but not dragging/shuffling; slow cadence maintained throughout with no imbalance noted; pt unable to incr speed with cues ? ? ?Stairs ?  ?  ?  ?  ?  ? ? ?Wheelchair Mobility ?  ? ?Modified Rankin (Stroke Patients Only) ?  ? ? ?  ?Balance Overall balance assessment: Needs assistance ?Sitting-balance support: No upper extremity supported, Feet supported ?Sitting balance-Leahy Scale: Good ?  ?  ?Standing balance support: No upper extremity supported, During functional activity ?Standing balance-Leahy Scale: Fair ?  ?  ?  ?  ?  ?  ?  ?  ?  ?  ?  ?  ?  ? ?  ?Cognition Arousal/Alertness: Lethargic (was sleeping on arrival; easily awakened but groggy) ?Behavior During Therapy: Flat affect ?Overall Cognitive Status: No family/caregiver present to determine baseline cognitive functioning ?  ?  ?  ?  ?  ?  ?  ?  ?  ?  ?  ?  ?  ?  ?  ?  ?General Comments: Pt most likely near baseline, follows multistep commands, has good recall of recent events. Has a great sense of humor ?  ?  ? ?  ?Exercises   ? ?  ?General Comments General comments (skin integrity, edema, etc.): Wife present and reports pt has been going to OPPT to work on his balance and gait ?  ?  ? ?Pertinent  Vitals/Pain Pain Assessment ?Pain Assessment: No/denies pain  ? ? ?Home Living   ?  ?  ?  ?  ?  ?  ?  ?  ?  ?   ?  ?Prior Function    ?  ?  ?   ? ?PT Goals (current goals can now be found in the care plan section) Acute Rehab PT Goals ?Patient Stated Goal: to go home ?PT Goal Formulation: With patient ?Time For Goal Achievement: 09/22/21 ?Potential to Achieve Goals: Good ?Progress towards PT goals: Progressing toward goals ? ?  ?Frequency ? ? ? Min 3X/week ? ? ? ?  ?PT Plan Current plan remains  appropriate  ? ? ?Co-evaluation   ?  ?  ?  ?  ? ?  ?AM-PAC PT "6 Clicks" Mobility   ?Outcome Measure ? Help needed turning from your back to your side while in a flat bed without using bedrails?: A Little ?Help needed moving from lying on your back to sitting on the side of a flat bed without using bedrails?: A Little ?Help needed moving to and from a bed to a chair (including a wheelchair)?: A Little ?Help needed standing up from a chair using your arms (e.g., wheelchair or bedside chair)?: A Little ?Help needed to walk in hospital room?: A Little ?Help needed climbing 3-5 steps with a railing? : A Little ?6 Click Score: 18 ? ?  ?End of Session Equipment Utilized During Treatment: Gait belt ?Activity Tolerance: Patient tolerated treatment well ?Patient left: with call bell/phone within reach;in bed;with bed alarm set ?Nurse Communication: Mobility status ?PT Visit Diagnosis: Muscle weakness (generalized) (M62.81);Unsteadiness on feet (R26.81) ?  ? ? ?Time: 1610-9604 ?PT Time Calculation (min) (ACUTE ONLY): 23 min ? ?Charges:  $Gait Training: 8-22 mins          ?          ? ? ?Jonathon [name], PT ?Acute Rehabilitation Services  ?Pager [phone] ?Office [phone] ? ? ? ?Jonathon [name] Jonathon [name] ?09/10/2021, 10:33 AM ? ?

## 2021-09-10 NOTE — Progress Notes (Signed)
Pt notified of his discharge. Called pt's wife to make her aware. Pt's wife and daughter will transport pt home. ?

## 2021-09-10 NOTE — Care Management Important Message (Signed)
Important Message ? ?Patient Details  ?Name: Jonathon [name] ?MRN: [id number] ?Date of Birth: Sep 02, 1950 ? ? ?Medicare Important Message Given:  Yes ? ? ? ? ?Toneka Fullen ?09/10/2021, 3:15 PM ?

## 2021-09-10 NOTE — TOC Transition Note (Signed)
Transition of Care (TOC) - CM/SW Discharge Note ? ? ?Patient Details  ?Name: Jonathon [name] ?MRN: [id number] ?Date of Birth: Oct 11, 1950 ? ?Transition of Care (TOC) CM/SW Contact:  ?Carles Collet, RN ?Phone Number: ?09/10/2021, 10:30 AM ? ? ?Clinical Narrative:   Spoke w patient and family at bedside. He declines need for DME, none recommended by PTOT. He states that he is active with [facility] outpatient PT and would like to continue there after DC. No TOC needs identified at this time.  ? ? ? ?Final next level of care: Home/Self Care ?Barriers to Discharge: No Barriers Identified ? ? ?Patient Goals and CMS Choice ?Patient states their goals for this hospitalization and ongoing recovery are:: to go home ?  ?  ? ?Discharge Placement ?  ?           ?  ?  ?  ?  ? ?Discharge Plan and Services ?  ?Discharge Planning Services: CM Consult ?           ?DME Arranged: N/A ?  ?  ?  ?  ?  ?[place] Agency: NA ?  ?  ?  ? ?Social Determinants of Health (SDOH) Interventions ?  ? ? ?Readmission Risk Interventions ? ?  07/18/2019  ?  2:54 PM  ?Readmission Risk Prevention Plan  ?Transportation Screening Complete  ?PCP or Specialist Appt within 3-5 Days Complete  ?Palliative Care Screening Not Applicable  ? ? ? ? ? ?

## 2021-09-10 NOTE — Progress Notes (Deleted)
   Office Visit   Patient ID: Trase Bunda, male    DOB: [birth year], 71 y.o.   MRN: [id number]   PCP: Lajean Manes, MD   Subjective:  CC: hospital follow up  Evo Aderman is a 71 y.o. year old male who presents for hospital follow-up.  He initially presented to the internal medicine center on 5/11 for evaluation of 1 year of progressive fatigue and orthostatic symptoms.  Initial lab work-up revealed a hemoglobin of a 4.5.  He was notified of the results and encouraged to report to the ED for hospital admission.  Stool occult was positive.  Found to have profound iron deficiency, ferritin 3.  He subsequently received a total of 5 units PRBCs and 3 ferrlecit '250mg'$  transfusions over the course of his hospitalization.  He underwent EGD and colonoscopy on 5/15.  EGD: Schatzki's ring.  Numerous nonbleeding benign-appearing gastric polyps of which several were removed.  Prepyloric gastritis.  Duodenum was normal. Colonoscopy: 12 polyps sized 3 to 8 mm resected throughout the colon and cecum.  No active bleeding was discovered.  Xarelto was resumed at discharge.  He was instructed to follow-up with the internal medicine center within a week and with GI in 4-8 weeks  He was evaluated by physical therapy during his hospitalization and recommended outpatient PT.  Objective:   There were no vitals taken for this visit.  Assessment & Plan:   Problem List Items Addressed This Visit   None    No follow-ups on file.   Pt discussed with ***  Mitzi Hansen, MD Internal Medicine Resident PGY-3 [facility] 09/10/2021 1:43 PM

## 2021-09-10 NOTE — Progress Notes (Signed)
Discharge paperwork reviewed with pt and pt's wife. Both verbalized understanding. Pt alert and oriented x4 in no acute distress upon discharge. Pt has gathered his belongings to take home with him. Pt's daughter to transport home. Pt taken to main entrance for discharge by NT.  ?

## 2021-09-10 NOTE — Care Management Important Message (Signed)
Important Message ? ?Patient Details  ?Name: Jonathon [name] ?MRN: [id number] ?Date of Birth: 1951/03/16 ? ? ?Medicare Important Message Given:  Yes ? ? ? ? ?Jerrik Housholder ?09/10/2021, 3:21 PM ?

## 2021-09-11 ENCOUNTER — Telehealth: Payer: Self-pay

## 2021-09-11 ENCOUNTER — Telehealth: Payer: Self-pay | Admitting: *Deleted

## 2021-09-11 LAB — PATHOLOGIST SMEAR REVIEW

## 2021-09-11 NOTE — Telephone Encounter (Signed)
Called pt's wife - no answer; left message on self-identified vm the doctor returned her call, in which no answer and to call the office to schedule pt's HFU appt and to discuss pt's cough. ?According to pt's chart, pt has an appt tomorrow 5/18 with Dr Jeanice Lim. ?

## 2021-09-11 NOTE — Telephone Encounter (Signed)
Call from patient's wife-patient was recently discharged from hospital.  Has a cough unable to get mucous up when he coughs.  Has been coughing frequently and was told to try something over the counter.  With patient's problems wife is uncomfortable getting something over the counter.  Would like to speak to a doctor about what she should get. ?

## 2021-09-11 NOTE — Telephone Encounter (Signed)
Pt's wife,Anne, calling back to speak to the doctor; states she's at home and is able to talk. ?

## 2021-09-11 NOTE — Telephone Encounter (Signed)
Transition Care Management Unsuccessful Follow-up Telephone Call ? ?Date of discharge and from where:  [facility] 09/06/21-09/09/21 ? ?Attempts:  1st Attempt ? ?Reason for unsuccessful TCM follow-up call:  No answer/busy ? ?[name], RN, BSN, CCM ?Care Management Coordinator ?[facility] ?Phone: [phone]/NLZ: [phone]  ?

## 2021-09-12 ENCOUNTER — Ambulatory Visit (INDEPENDENT_AMBULATORY_CARE_PROVIDER_SITE_OTHER): Payer: Medicare Other | Admitting: Internal Medicine

## 2021-09-12 VITALS — BP 111/77 | HR 85 | Temp 98.2°F | Wt 247.2 lb

## 2021-09-12 DIAGNOSIS — Z09 Encounter for follow-up examination after completed treatment for conditions other than malignant neoplasm: Secondary | ICD-10-CM

## 2021-09-12 DIAGNOSIS — D500 Iron deficiency anemia secondary to blood loss (chronic): Secondary | ICD-10-CM

## 2021-09-12 DIAGNOSIS — E876 Hypokalemia: Secondary | ICD-10-CM | POA: Diagnosis not present

## 2021-09-12 DIAGNOSIS — K635 Polyp of colon: Secondary | ICD-10-CM

## 2021-09-12 DIAGNOSIS — N3943 Post-void dribbling: Secondary | ICD-10-CM | POA: Diagnosis not present

## 2021-09-12 DIAGNOSIS — I4811 Longstanding persistent atrial fibrillation: Secondary | ICD-10-CM

## 2021-09-12 DIAGNOSIS — B348 Other viral infections of unspecified site: Secondary | ICD-10-CM

## 2021-09-12 DIAGNOSIS — K317 Polyp of stomach and duodenum: Secondary | ICD-10-CM

## 2021-09-12 DIAGNOSIS — K2990 Gastroduodenitis, unspecified, without bleeding: Secondary | ICD-10-CM | POA: Diagnosis not present

## 2021-09-12 DIAGNOSIS — K2970 Gastritis, unspecified, without bleeding: Secondary | ICD-10-CM | POA: Diagnosis not present

## 2021-09-12 DIAGNOSIS — N401 Enlarged prostate with lower urinary tract symptoms: Secondary | ICD-10-CM | POA: Diagnosis not present

## 2021-09-12 DIAGNOSIS — N1832 Chronic kidney disease, stage 3b: Secondary | ICD-10-CM | POA: Diagnosis not present

## 2021-09-12 DIAGNOSIS — D696 Thrombocytopenia, unspecified: Secondary | ICD-10-CM

## 2021-09-13 ENCOUNTER — Other Ambulatory Visit (INDEPENDENT_AMBULATORY_CARE_PROVIDER_SITE_OTHER): Payer: Medicare Other

## 2021-09-13 ENCOUNTER — Encounter: Payer: Medicare Other | Admitting: Internal Medicine

## 2021-09-13 ENCOUNTER — Encounter: Payer: Self-pay | Admitting: Internal Medicine

## 2021-09-13 DIAGNOSIS — Z09 Encounter for follow-up examination after completed treatment for conditions other than malignant neoplasm: Secondary | ICD-10-CM | POA: Insufficient documentation

## 2021-09-13 DIAGNOSIS — D696 Thrombocytopenia, unspecified: Secondary | ICD-10-CM | POA: Diagnosis not present

## 2021-09-13 DIAGNOSIS — B348 Other viral infections of unspecified site: Secondary | ICD-10-CM | POA: Insufficient documentation

## 2021-09-13 DIAGNOSIS — N400 Enlarged prostate without lower urinary tract symptoms: Secondary | ICD-10-CM | POA: Insufficient documentation

## 2021-09-13 DIAGNOSIS — E876 Hypokalemia: Secondary | ICD-10-CM | POA: Insufficient documentation

## 2021-09-13 LAB — BASIC METABOLIC PANEL
BUN/Creatinine Ratio: 8 — ABNORMAL LOW (ref 10–24)
BUN: 13 mg/dL (ref 8–27)
CO2: 19 mmol/L — ABNORMAL LOW (ref 20–29)
Calcium: 8.8 mg/dL (ref 8.6–10.2)
Chloride: 104 mmol/L (ref 96–106)
Creatinine, Ser: 1.65 mg/dL — ABNORMAL HIGH (ref 0.76–1.27)
Glucose: 169 mg/dL — ABNORMAL HIGH (ref 70–99)
Potassium: 3.7 mmol/L (ref 3.5–5.2)
Sodium: 141 mmol/L (ref 134–144)
eGFR: 44 mL/min/{1.73_m2} — ABNORMAL LOW (ref >59)

## 2021-09-13 LAB — CBC WITH DIFFERENTIAL/PLATELET
Basophils Absolute: 0 10*3/uL (ref 0.0–0.2)
Basos: 0 %
EOS (ABSOLUTE): 0 10*3/uL (ref 0.0–0.4)
Eos: 1 %
Hematocrit: 34.2 % — ABNORMAL LOW (ref 37.5–51.0)
Hemoglobin: 9.8 g/dL — ABNORMAL LOW (ref 13.0–17.7)
Immature Grans (Abs): 0 10*3/uL (ref 0.0–0.1)
Immature Granulocytes: 0 %
Lymphocytes Absolute: 0.7 10*3/uL (ref 0.7–3.1)
Lymphs: 15 %
MCH: 21.4 pg — ABNORMAL LOW (ref 26.6–33.0)
MCHC: 28.7 g/dL — ABNORMAL LOW (ref 31.5–35.7)
MCV: 75 fL — ABNORMAL LOW (ref 79–97)
Monocytes Absolute: 0.4 10*3/uL (ref 0.1–0.9)
Monocytes: 9 %
Neutrophils Absolute: 3.3 10*3/uL (ref 1.4–7.0)
Neutrophils: 75 %
Platelets: 65 10*3/uL — CL (ref 150–450)
RBC: 4.59 x10E6/uL (ref 4.14–5.80)
RDW: 30.9 % — ABNORMAL HIGH (ref 11.6–15.4)
WBC: 4.5 10*3/uL (ref 3.4–10.8)

## 2021-09-13 LAB — CBC
HCT: 34 % — ABNORMAL LOW (ref 39.0–52.0)
Hemoglobin: 9.9 g/dL — ABNORMAL LOW (ref 13.0–17.0)
MCH: 22 pg — ABNORMAL LOW (ref 26.0–34.0)
MCHC: 29.1 g/dL — ABNORMAL LOW (ref 30.0–36.0)
MCV: 75.7 fL — ABNORMAL LOW (ref 80.0–100.0)
Platelets: 96 10*3/uL — ABNORMAL LOW (ref 150–400)
RBC: 4.49 MIL/uL (ref 4.22–5.81)
RDW: 35.7 % — ABNORMAL HIGH (ref 11.5–15.5)
WBC: 5.7 10*3/uL (ref 4.0–10.5)
nRBC: 0 % (ref 0.0–0.2)

## 2021-09-13 NOTE — Assessment & Plan Note (Addendum)
He presents for hospital follow-up today.  He initially presented to the [facility] on 5/11 for evaluation of 1 year history of progressive fatigue and orthostasis.  Initial work-up, including CBC, revealed a hemoglobin of 4.5.  He was referred to the ER and was subsequently admitted.  Hemoglobin on admission noted to be 4.1.  Iron panel revealed a ferritin of 3.  He received a total of 5 units of PRBCs and 3 iron transfusions during the admission.  Stool occult was positive.  He underwent EGD and colonoscopy which revealed gastritis, gastric polyps and some colon polyps however no source of the bleed was identified.  Hemoglobin stabilized and he was subsequently discharged.  Xarelto was resumed at time of discharge.  Discharge hemoglobin 8.7. He continues to deny any bleeding today.  Denies melena or hematochezia. Fatigue and orthostasis symptoms have improved.  Assessment CBC today reveals hemoglobin stabilization at 9.8.  Additionally, platelets were noted to be 65, which was a fairly significant decrease from his baseline in the low 100s.  I had him return to the office on 5/19 for repeat CBC which revealed a platelet count of 98.  Suspect initial platelet count of 65 was falsely lowered by platelet clumping.  Plan - He has scheduled follow-up with GI on 6/15.  If hemoglobin continues to fall again, I think the next step in work-up would be capsule endoscopy. - Repeat CBC in 4 weeks.  Iron panel can be obtained concurrently at that time. - Does not appear that he has had further work-up of thrombocytopenia although this has been present for almost 10 years.  See additional information under separate documentation under " thrombocytopenia"

## 2021-09-13 NOTE — Assessment & Plan Note (Signed)
This is a chronic issue dating back to at least 10 years ago.  Thrombocytopenia has remained fairly stable over this period of time.  Initial CBC at his hospital follow-up on 5/18 revealed a falsely low platelet count of 65.  Repeat CBC on 5/19 revealed that his platelets are 98, near his baseline in the low 100s. Does not appear that he has had a significant work-up for the thrombocytopenia in the past.  Abdominal ultrasound in 2014 at [facility] revealed diffuse echogenicity. He does admit to about a 54-year history of drinking 2-3 alcoholic beverages daily.  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  AST and ALT are not elevated.  Liver pathology, including cirrhosis, should be considered although elevated INR and low albumin could be attributable to Xarelto and chronically ill state respectively.  Chronic alcohol use alone also considered.  MDS less likely in the absence of a gamma gap or other clinical features.  Plan: Thrombocytopenia was noted by GI during his hospitalization.  He may benefit from further evaluation for cirrhosis.  Will defer to GI on this and recommend follow-up at his next [facility] visit.

## 2021-09-13 NOTE — Assessment & Plan Note (Signed)
He experienced hypokalemia over the course of his recent hospitalization.  Discharge potassium was 3.4. - Follow-up BMP today shows resolution of hypokalemia--3.7 today. - We will resolve this issue

## 2021-09-13 NOTE — Assessment & Plan Note (Signed)
Chronic and stable.  Avoid nephrotoxic agents.  Continue to monitor

## 2021-09-13 NOTE — Assessment & Plan Note (Signed)
Diagnosed at time of discharge of his recent hospitalization after developing a cough and a single febrile episode. - No further febrile episodes since discharge.  Continues to experience a dry cough.  Discussed prolonged postviral cough expectations.  Supportive management discussed

## 2021-09-13 NOTE — Assessment & Plan Note (Signed)
Identified on EGD on recent hospitalization.  Pathology results reveal gastric hyperplastic polyps, negative for intestinal metaplasia or dysplasia. - Follow-up with GI on 6/15

## 2021-09-13 NOTE — Assessment & Plan Note (Signed)
Patient was on Flomax prior to his last visit on 5/11.  I had discontinued Flomax at that visit due to orthostasis.  Orthostasis was found to be secondary to severe symptomatic anemia.  Patient notes that his BPH symptoms, including incomplete bladder emptying, urinary frequency, weak stream, and postvoid dribbling, have worsened since discontinuation.  PSA was 2.3. - Resume Flomax.  He will let us know if he develops orthostatic symptoms again.  If symptoms persist or if he develops recurrent orthostasis, I would consider initiation of finasteride or referral to urology.

## 2021-09-13 NOTE — Assessment & Plan Note (Addendum)
Noted on EGD during this last hospital admission.  No H. pylori identified on biopsy. - Continue Protonix.  Follow-up with GI on 6/15.

## 2021-09-13 NOTE — Assessment & Plan Note (Signed)
Identified on recent hospitalization with colonoscopy.  Noted to have tubular adenomas of the colon, cecal, ascending, and transverse colon without signs of high-grade dysplasia or malignancy - Follow-up with GI on 6/15

## 2021-09-13 NOTE — Assessment & Plan Note (Addendum)
Admitted 5/12 - 5/16 for symptomatic anemia.  See further details under separate documentation. - Discharge summary, procedure notes, and discharge labs personally reviewed - Discharge medications reviewed and reconciled - Reviewed plans for specialty follow-up including GI, scheduled for 6/15

## 2021-09-13 NOTE — Progress Notes (Signed)
Office Visit   Patient ID: Jonathon [name], male    DOB: 1950/06/14, 71 y.o.   MRN: [id number]   PCP: Lajean Manes, MD   Subjective:  CC: Hospitalization Follow-up and Cough   Jonathon [name] is a 71 y.o. year old male who presents for the above medical condition(s). Please refer to problem based charting for assessment and plan.  Objective:   BP 111/77 (BP Location: Left Arm, Patient Position: Sitting, Cuff Size: Normal)   Pulse 85   Temp 98.2 F (36.8 C) (Oral)   Wt 247 lb 3.2 oz (112.1 kg)   SpO2 97%   BMI 33.53 kg/m   General: Chronically ill-appearing male in no acute distress Cardiac: Heart regular rate, irregular rhythm.  Trace lower extremity edema Pulm: Breathing comfortably on room air, lung sounds are clear, dry cough noted GI: Abdomen soft, nontender, nondistended Skin: Improved color compared to 5/11 office visit Assessment & Plan:   Problem List Items Addressed This Visit       Cardiovascular and Mediastinum   Atrial fibrillation ([place])    Rate controlled on metoprolol succinate 50 mg daily.  Anticoagulated with Xarelto 20 mg daily.  No recurrent overt bleeding since hospitalization.  Continue current management.         Digestive   Multiple polyps of sigmoid colon    Identified on recent hospitalization with colonoscopy.  Noted to have tubular adenomas of the colon, cecal, ascending, and transverse colon without signs of high-grade dysplasia or malignancy - Follow-up with GI on 6/15       Multiple gastric polyps    Identified on EGD on recent hospitalization.  Pathology results reveal gastric hyperplastic polyps, negative for intestinal metaplasia or dysplasia. - Follow-up with GI on 6/15       Gastritis and gastroduodenitis    Noted on EGD during this last hospital admission.  No H. pylori identified on biopsy. - Continue Protonix.  Follow-up with GI on 6/15.         Genitourinary   BPH (benign prostatic hyperplasia) (Chronic)     Patient was on Flomax prior to his last visit on 5/11.  I had discontinued Flomax at that visit due to orthostasis.  Orthostasis was found to be secondary to severe symptomatic anemia.  Patient notes that his BPH symptoms, including incomplete bladder emptying, urinary frequency, weak stream, and postvoid dribbling, have worsened since discontinuation.  PSA was 2.3. - Resume Flomax.  He will let us know if he develops orthostatic symptoms again.  If symptoms persist or if he develops recurrent orthostasis, I would consider initiation of finasteride or referral to urology.       CKD (chronic kidney disease) stage 3, GFR 30-59 ml/min (HCC)    Chronic and stable.  Avoid nephrotoxic agents.  Continue to monitor         Hematopoietic and Hemostatic   Thrombocytopenia (HCC) (Chronic)    This is a chronic issue dating back to at least 10 years ago.  Thrombocytopenia has remained fairly stable over this period of time.  Initial CBC at his hospital follow-up on 5/18 revealed a falsely low platelet count of 65.  Repeat CBC on 5/19 revealed that his platelets are 98, near his baseline in the low 100s. Does not appear that he has had a significant work-up for the thrombocytopenia in the past.  Abdominal ultrasound in 2014 at [facility] revealed diffuse echogenicity. He does admit to about a 54-year history of drinking 2-3 alcoholic beverages daily.  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  AST and ALT are not elevated.  Liver pathology, including cirrhosis, should be considered although elevated INR and low albumin could be attributable to Xarelto and chronically ill state respectively.  Chronic alcohol use alone also considered.  MDS less likely in the absence of a gamma gap or other clinical features.  Plan: Thrombocytopenia was noted by GI during his hospitalization.  He may benefit from further evaluation for cirrhosis.  Will defer to  GI on this and recommend follow-up at his next [facility] visit.       Relevant Orders   CBC no Diff (Completed)     Other   Iron deficiency anemia    He presents for hospital follow-up today.  He initially presented to the [facility] on 5/11 for evaluation of 1 year history of progressive fatigue and orthostasis.  Initial work-up, including CBC, revealed a hemoglobin of 4.5.  He was referred to the ER and was subsequently admitted.  Hemoglobin on admission noted to be 4.1.  Iron panel revealed a ferritin of 3.  He received a total of 5 units of PRBCs and 3 iron transfusions during the admission.  Stool occult was positive.  He underwent EGD and colonoscopy which revealed gastritis, gastric polyps and some colon polyps however no source of the bleed was identified.  Hemoglobin stabilized and he was subsequently discharged.  Xarelto was resumed at time of discharge.  Discharge hemoglobin 8.7. He continues to deny any bleeding today.  Denies melena or hematochezia. Fatigue and orthostasis symptoms have improved.  Assessment CBC today reveals hemoglobin stabilization at 9.8.  Additionally, platelets were noted to be 65, which was a fairly significant decrease from his baseline in the low 100s.  I had him return to the office on 5/19 for repeat CBC which revealed a platelet count of 98.  Suspect initial platelet count of 65 was falsely lowered by platelet clumping.  Plan - He has scheduled follow-up with GI on 6/15.  If hemoglobin continues to fall again, I think the next step in work-up would be capsule endoscopy. - Repeat CBC in 4 weeks.  Iron panel can be obtained concurrently at that time. - Does not appear that he has had further work-up of thrombocytopenia although this has been present for almost 10 years.  See additional information under separate documentation under " thrombocytopenia"        Relevant Orders   CBC with Diff (Completed)   CBC no Diff   Ferritin   Parainfluenza    Diagnosed at time  of discharge of his recent hospitalization after developing a cough and a single febrile episode. - No further febrile episodes since discharge.  Continues to experience a dry cough.  Discussed prolonged postviral cough expectations.  Supportive management discussed       Hospital discharge follow-up - Primary    Admitted 5/12 - 5/16 for symptomatic anemia.  See further details under separate documentation. - Discharge summary, procedure notes, and discharge labs personally reviewed - Discharge medications reviewed and reconciled - Reviewed plans for specialty follow-up including GI, scheduled for 6/15       RESOLVED: Hypokalemia    He experienced hypokalemia over the course of his recent hospitalization.  Discharge potassium was 3.4. - Follow-up BMP today shows resolution of hypokalemia--3.7 today. - We will resolve this issue       Relevant Orders   Basic metabolic panel (Completed)  Follow-ups - GI scheduled for 10/10/2021 - Return for follow-up lab only visit-CBC and iron studies in 4 weeks, can be canceled if obtained during GI appointment - Follow-up with PCP in 2 to 3 months   Pt discussed with Dr. Elwanda Brooklyn, MD Internal Medicine Resident PGY-3 [facility] 09/13/2021 4:45 PM

## 2021-09-13 NOTE — Assessment & Plan Note (Signed)
Rate controlled on metoprolol succinate 50 mg daily.  Anticoagulated with Xarelto 20 mg daily.  No recurrent overt bleeding since hospitalization.  Continue current management.

## 2021-09-16 ENCOUNTER — Telehealth: Payer: Self-pay

## 2021-09-16 ENCOUNTER — Telehealth: Payer: Self-pay | Admitting: Internal Medicine

## 2021-09-16 ENCOUNTER — Telehealth: Payer: Medicare Other | Admitting: Internal Medicine

## 2021-09-16 DIAGNOSIS — H6123 Impacted cerumen, bilateral: Secondary | ICD-10-CM | POA: Diagnosis not present

## 2021-09-16 DIAGNOSIS — J209 Acute bronchitis, unspecified: Secondary | ICD-10-CM | POA: Diagnosis not present

## 2021-09-16 DIAGNOSIS — G4700 Insomnia, unspecified: Secondary | ICD-10-CM | POA: Diagnosis not present

## 2021-09-16 NOTE — Telephone Encounter (Signed)
Wife called again wanting to bring patient in this AM. States, "he cannot go on like this, he's not sleeping." States they've tried Robitussin OTC w/o relief. After discussing UC as an option, wife took tele appt today at 2:15.

## 2021-09-16 NOTE — Progress Notes (Signed)
Internal Medicine Clinic Attending  Case discussed with Dr. Christian  At the time of the visit.  We reviewed the resident's history and exam and pertinent patient test results.  I agree with the assessment, diagnosis, and plan of care documented in the resident's note.  

## 2021-09-16 NOTE — Addendum Note (Signed)
Addended by: Lalla Brothers T on: 09/16/2021 09:28 AM   Modules accepted: Level of Service

## 2021-09-16 NOTE — Telephone Encounter (Signed)
Pt's spouse requesting a call back.  Per the Spouse his congestion is not getting any better and he is unable to sleep.

## 2021-09-16 NOTE — Telephone Encounter (Signed)
We discussed that he may have a lingering cough leading to difficulty sleeping for a few weeks given his recent viral infection. Please see if he has any new symptoms and if so, then he may need a clinic appointment. If not, then please arrange a telehealth visit so we can revisit this discussion.

## 2021-09-16 NOTE — Telephone Encounter (Signed)
CMN Received - Casts released from hold, shoes ordered  Apex [id number] 11.5W   Patient's wife called per her request

## 2021-09-19 ENCOUNTER — Observation Stay (HOSPITAL_COMMUNITY): Payer: Medicare Other

## 2021-09-19 ENCOUNTER — Other Ambulatory Visit: Payer: Self-pay

## 2021-09-19 ENCOUNTER — Encounter (HOSPITAL_COMMUNITY): Payer: Self-pay

## 2021-09-19 ENCOUNTER — Inpatient Hospital Stay (HOSPITAL_COMMUNITY)
Admission: EM | Admit: 2021-09-19 | Discharge: 2021-09-21 | DRG: 920 | Disposition: A | Discharge status: Home / Self Care | Payer: Medicare Other | Attending: Internal Medicine | Admitting: Internal Medicine

## 2021-09-19 DIAGNOSIS — Z79899 Other long term (current) drug therapy: Secondary | ICD-10-CM

## 2021-09-19 DIAGNOSIS — E039 Hypothyroidism, unspecified: Secondary | ICD-10-CM | POA: Diagnosis present

## 2021-09-19 DIAGNOSIS — E876 Hypokalemia: Secondary | ICD-10-CM | POA: Diagnosis not present

## 2021-09-19 DIAGNOSIS — I10 Essential (primary) hypertension: Secondary | ICD-10-CM | POA: Diagnosis not present

## 2021-09-19 DIAGNOSIS — K922 Gastrointestinal hemorrhage, unspecified: Secondary | ICD-10-CM | POA: Diagnosis present

## 2021-09-19 DIAGNOSIS — I5032 Chronic diastolic (congestive) heart failure: Secondary | ICD-10-CM | POA: Diagnosis present

## 2021-09-19 DIAGNOSIS — Y838 Other surgical procedures as the cause of abnormal reaction of the patient, or of later complication, without mention of misadventure at the time of the procedure: Secondary | ICD-10-CM | POA: Diagnosis present

## 2021-09-19 DIAGNOSIS — I4819 Other persistent atrial fibrillation: Secondary | ICD-10-CM | POA: Diagnosis present

## 2021-09-19 DIAGNOSIS — Z7982 Long term (current) use of aspirin: Secondary | ICD-10-CM | POA: Diagnosis not present

## 2021-09-19 DIAGNOSIS — K219 Gastro-esophageal reflux disease without esophagitis: Secondary | ICD-10-CM | POA: Diagnosis present

## 2021-09-19 DIAGNOSIS — D62 Acute posthemorrhagic anemia: Secondary | ICD-10-CM | POA: Diagnosis not present

## 2021-09-19 DIAGNOSIS — E1122 Type 2 diabetes mellitus with diabetic chronic kidney disease: Secondary | ICD-10-CM | POA: Diagnosis present

## 2021-09-19 DIAGNOSIS — I130 Hypertensive heart and chronic kidney disease with heart failure and stage 1 through stage 4 chronic kidney disease, or unspecified chronic kidney disease: Secondary | ICD-10-CM | POA: Diagnosis present

## 2021-09-19 DIAGNOSIS — N183 Chronic kidney disease, stage 3 unspecified: Secondary | ICD-10-CM | POA: Diagnosis present

## 2021-09-19 DIAGNOSIS — K921 Melena: Secondary | ICD-10-CM | POA: Diagnosis not present

## 2021-09-19 DIAGNOSIS — Z885 Allergy status to narcotic agent status: Secondary | ICD-10-CM | POA: Diagnosis not present

## 2021-09-19 DIAGNOSIS — N281 Cyst of kidney, acquired: Secondary | ICD-10-CM | POA: Diagnosis not present

## 2021-09-19 DIAGNOSIS — I4811 Longstanding persistent atrial fibrillation: Secondary | ICD-10-CM | POA: Diagnosis not present

## 2021-09-19 DIAGNOSIS — Z87891 Personal history of nicotine dependence: Secondary | ICD-10-CM

## 2021-09-19 DIAGNOSIS — K5731 Diverticulosis of large intestine without perforation or abscess with bleeding: Secondary | ICD-10-CM | POA: Diagnosis not present

## 2021-09-19 DIAGNOSIS — K91840 Postprocedural hemorrhage and hematoma of a digestive system organ or structure following a digestive system procedure: Principal | ICD-10-CM | POA: Diagnosis present

## 2021-09-19 DIAGNOSIS — E1151 Type 2 diabetes mellitus with diabetic peripheral angiopathy without gangrene: Secondary | ICD-10-CM | POA: Diagnosis present

## 2021-09-19 DIAGNOSIS — K648 Other hemorrhoids: Secondary | ICD-10-CM | POA: Diagnosis present

## 2021-09-19 DIAGNOSIS — Z833 Family history of diabetes mellitus: Secondary | ICD-10-CM

## 2021-09-19 DIAGNOSIS — Z7901 Long term (current) use of anticoagulants: Secondary | ICD-10-CM

## 2021-09-19 DIAGNOSIS — K625 Hemorrhage of anus and rectum: Secondary | ICD-10-CM | POA: Diagnosis not present

## 2021-09-19 DIAGNOSIS — K633 Ulcer of intestine: Secondary | ICD-10-CM | POA: Diagnosis not present

## 2021-09-19 DIAGNOSIS — K8020 Calculus of gallbladder without cholecystitis without obstruction: Secondary | ICD-10-CM | POA: Diagnosis not present

## 2021-09-19 DIAGNOSIS — D500 Iron deficiency anemia secondary to blood loss (chronic): Secondary | ICD-10-CM | POA: Diagnosis not present

## 2021-09-19 DIAGNOSIS — Z79890 Hormone replacement therapy (postmenopausal): Secondary | ICD-10-CM | POA: Diagnosis not present

## 2021-09-19 DIAGNOSIS — Z800 Family history of malignant neoplasm of digestive organs: Secondary | ICD-10-CM

## 2021-09-19 DIAGNOSIS — Z86718 Personal history of other venous thrombosis and embolism: Secondary | ICD-10-CM | POA: Diagnosis not present

## 2021-09-19 DIAGNOSIS — K5730 Diverticulosis of large intestine without perforation or abscess without bleeding: Secondary | ICD-10-CM | POA: Diagnosis present

## 2021-09-19 DIAGNOSIS — F419 Anxiety disorder, unspecified: Secondary | ICD-10-CM | POA: Diagnosis present

## 2021-09-19 DIAGNOSIS — D638 Anemia in other chronic diseases classified elsewhere: Secondary | ICD-10-CM | POA: Diagnosis not present

## 2021-09-19 DIAGNOSIS — Z7984 Long term (current) use of oral hypoglycemic drugs: Secondary | ICD-10-CM | POA: Diagnosis not present

## 2021-09-19 DIAGNOSIS — F32A Depression, unspecified: Secondary | ICD-10-CM | POA: Diagnosis present

## 2021-09-19 LAB — CBC WITH DIFFERENTIAL/PLATELET
Abs Immature Granulocytes: 0.03 10*3/uL (ref 0.00–0.07)
Basophils Absolute: 0 10*3/uL (ref 0.0–0.1)
Basophils Relative: 1 %
Eosinophils Absolute: 0.1 10*3/uL (ref 0.0–0.5)
Eosinophils Relative: 2 %
HCT: 30.1 % — ABNORMAL LOW (ref 39.0–52.0)
Hemoglobin: 8.5 g/dL — ABNORMAL LOW (ref 13.0–17.0)
Immature Granulocytes: 0 %
Lymphocytes Relative: 13 %
Lymphs Abs: 0.9 10*3/uL (ref 0.7–4.0)
MCH: 22.1 pg — ABNORMAL LOW (ref 26.0–34.0)
MCHC: 28.2 g/dL — ABNORMAL LOW (ref 30.0–36.0)
MCV: 78.2 fL — ABNORMAL LOW (ref 80.0–100.0)
Monocytes Absolute: 0.5 10*3/uL (ref 0.1–1.0)
Monocytes Relative: 8 %
Neutro Abs: 5.2 10*3/uL (ref 1.7–7.7)
Neutrophils Relative %: 76 %
Platelets: 318 10*3/uL (ref 150–400)
RBC: 3.85 MIL/uL — ABNORMAL LOW (ref 4.22–5.81)
WBC: 6.8 10*3/uL (ref 4.0–10.5)
nRBC: 0 % (ref 0.0–0.2)

## 2021-09-19 LAB — CBC
HCT: 25.5 % — ABNORMAL LOW (ref 39.0–52.0)
Hemoglobin: 7.9 g/dL — ABNORMAL LOW (ref 13.0–17.0)
MCH: 23.9 pg — ABNORMAL LOW (ref 26.0–34.0)
MCHC: 31 g/dL (ref 30.0–36.0)
MCV: 77.3 fL — ABNORMAL LOW (ref 80.0–100.0)
Platelets: 253 10*3/uL (ref 150–400)
RBC: 3.3 MIL/uL — ABNORMAL LOW (ref 4.22–5.81)
WBC: 6.5 10*3/uL (ref 4.0–10.5)
nRBC: 0 % (ref 0.0–0.2)

## 2021-09-19 LAB — GLUCOSE, CAPILLARY
Glucose-Capillary: 121 mg/dL — ABNORMAL HIGH (ref 70–99)
Glucose-Capillary: 93 mg/dL (ref 70–99)

## 2021-09-19 LAB — HEMOGLOBIN AND HEMATOCRIT, BLOOD
HCT: 26.5 % — ABNORMAL LOW (ref 39.0–52.0)
Hemoglobin: 7.8 g/dL — ABNORMAL LOW (ref 13.0–17.0)

## 2021-09-19 LAB — PREPARE RBC (CROSSMATCH)

## 2021-09-19 LAB — COMPREHENSIVE METABOLIC PANEL
ALT: 16 U/L (ref 0–44)
AST: 25 U/L (ref 15–41)
Albumin: 2.8 g/dL — ABNORMAL LOW (ref 3.5–5.0)
Alkaline Phosphatase: 34 U/L — ABNORMAL LOW (ref 38–126)
Anion gap: 9 (ref 5–15)
BUN: 14 mg/dL (ref 8–23)
CO2: 24 mmol/L (ref 22–32)
Calcium: 8.9 mg/dL (ref 8.9–10.3)
Chloride: 105 mmol/L (ref 98–111)
Creatinine, Ser: 1.48 mg/dL — ABNORMAL HIGH (ref 0.61–1.24)
GFR, Estimated: 50 mL/min — ABNORMAL LOW (ref >60)
Glucose, Bld: 172 mg/dL — ABNORMAL HIGH (ref 70–99)
Potassium: 3.3 mmol/L — ABNORMAL LOW (ref 3.5–5.1)
Sodium: 138 mmol/L (ref 135–145)
Total Bilirubin: 1.3 mg/dL — ABNORMAL HIGH (ref 0.3–1.2)
Total Protein: 5.7 g/dL — ABNORMAL LOW (ref 6.5–8.1)

## 2021-09-19 LAB — PROTIME-INR
INR: 3.1 — ABNORMAL HIGH (ref 0.8–1.2)
Prothrombin Time: 32.1 seconds — ABNORMAL HIGH (ref 11.4–15.2)

## 2021-09-19 LAB — POC OCCULT BLOOD, ED: Fecal Occult Bld: POSITIVE — AB

## 2021-09-19 LAB — CBG MONITORING, ED: Glucose-Capillary: 113 mg/dL — ABNORMAL HIGH (ref 70–99)

## 2021-09-19 MED ORDER — SODIUM CHLORIDE 0.9% IV SOLUTION: Freq: Once | INTRAVENOUS | Status: AC

## 2021-09-19 MED ORDER — METOPROLOL TARTRATE 25 MG PO TABS
25.0000 mg | ORAL_TABLET | Freq: Two times a day (BID) | ORAL | Status: AC
  Administered 2021-09-19 – 2021-09-20 (×4): 25 mg via ORAL
  Filled 2021-09-19 (×4): qty 1

## 2021-09-19 MED ORDER — PROTHROMBIN COMPLEX CONC HUMAN 500 UNITS IV KIT
2587.0000 [IU] | PACK | Status: AC
  Administered 2021-09-19: 2587 [IU] via INTRAVENOUS
  Filled 2021-09-19: qty 2032

## 2021-09-19 MED ORDER — ACETAMINOPHEN 650 MG RE SUPP: 650.0000 mg | Freq: Four times a day (QID) | RECTAL | Status: DC | PRN

## 2021-09-19 MED ORDER — SODIUM CHLORIDE 0.9 % IV SOLN
10.0000 mL/h | Freq: Once | INTRAVENOUS | Status: AC
  Administered 2021-09-19: 10 mL/h via INTRAVENOUS

## 2021-09-19 MED ORDER — GUAIFENESIN-DM 100-10 MG/5ML PO SYRP
5.0000 mL | ORAL_SOLUTION | ORAL | Status: DC | PRN
  Administered 2021-09-19 – 2021-09-21 (×3): 5 mL via ORAL
  Filled 2021-09-19 (×3): qty 5

## 2021-09-19 MED ORDER — POTASSIUM CHLORIDE CRYS ER 20 MEQ PO TBCR
40.0000 meq | EXTENDED_RELEASE_TABLET | Freq: Once | ORAL | Status: AC
Start: 2021-09-19 — End: 2021-09-19
  Administered 2021-09-19: 40 meq via ORAL
  Filled 2021-09-19: qty 2

## 2021-09-19 MED ORDER — SODIUM CHLORIDE 0.9% FLUSH
3.0000 mL | Freq: Two times a day (BID) | INTRAVENOUS | Status: DC
  Administered 2021-09-19 – 2021-09-21 (×4): 3 mL via INTRAVENOUS

## 2021-09-19 MED ORDER — LEVOTHYROXINE SODIUM 112 MCG PO TABS
112.0000 ug | ORAL_TABLET | Freq: Every day | ORAL | Status: DC
  Administered 2021-09-20 – 2021-09-21 (×2): 112 ug via ORAL
  Filled 2021-09-19 (×2): qty 1

## 2021-09-19 MED ORDER — PEG-KCL-NACL-NASULF-NA ASC-C 100 G PO SOLR
0.5000 | Freq: Once | ORAL | Status: AC
  Administered 2021-09-19: 100 g via ORAL
  Filled 2021-09-19: qty 1

## 2021-09-19 MED ORDER — TAMSULOSIN HCL 0.4 MG PO CAPS
0.4000 mg | ORAL_CAPSULE | Freq: Every day | ORAL | Status: DC
Start: 2021-09-19 — End: 2021-09-21
  Administered 2021-09-19 – 2021-09-20 (×2): 0.4 mg via ORAL
  Filled 2021-09-19 (×2): qty 1

## 2021-09-19 MED ORDER — SODIUM CHLORIDE 0.9 % IV BOLUS
1000.0000 mL | Freq: Once | INTRAVENOUS | Status: AC
  Administered 2021-09-19: 1000 mL via INTRAVENOUS

## 2021-09-19 MED ORDER — ATORVASTATIN CALCIUM 80 MG PO TABS
80.0000 mg | ORAL_TABLET | Freq: Every day | ORAL | Status: DC
Start: 2021-09-19 — End: 2021-09-21
  Administered 2021-09-19 – 2021-09-20 (×2): 80 mg via ORAL
  Filled 2021-09-19 (×2): qty 1

## 2021-09-19 MED ORDER — SODIUM CHLORIDE 0.9 % IV SOLN: 250.0000 mL | INTRAVENOUS | Status: DC | PRN

## 2021-09-19 MED ORDER — INSULIN ASPART 100 UNIT/ML IJ SOLN
0.0000 [IU] | Freq: Three times a day (TID) | INTRAMUSCULAR | Status: DC
  Administered 2021-09-19 – 2021-09-21 (×2): 2 [IU] via SUBCUTANEOUS

## 2021-09-19 MED ORDER — METOPROLOL SUCCINATE ER 25 MG PO TB24: 50.0000 mg | ORAL_TABLET | Freq: Every day | ORAL | Status: DC

## 2021-09-19 MED ORDER — SERTRALINE HCL 100 MG PO TABS
100.0000 mg | ORAL_TABLET | Freq: Every day | ORAL | Status: DC
Start: 2021-09-20 — End: 2021-09-21
  Administered 2021-09-20 – 2021-09-21 (×2): 100 mg via ORAL
  Filled 2021-09-19 (×2): qty 1

## 2021-09-19 MED ORDER — PEG-KCL-NACL-NASULF-NA ASC-C 100 G PO SOLR
1.0000 | Freq: Once | ORAL | Status: DC
Start: 2021-09-19 — End: 2021-09-19

## 2021-09-19 MED ORDER — SODIUM CHLORIDE 0.9% FLUSH: 3.0000 mL | INTRAVENOUS | Status: DC | PRN

## 2021-09-19 MED ORDER — ACETAMINOPHEN 325 MG PO TABS: 650.0000 mg | ORAL_TABLET | Freq: Four times a day (QID) | ORAL | Status: DC | PRN

## 2021-09-19 MED ORDER — IOHEXOL 350 MG/ML SOLN
100.0000 mL | Freq: Once | INTRAVENOUS | Status: AC | PRN
  Administered 2021-09-19: 100 mL via INTRAVENOUS

## 2021-09-19 MED ORDER — PANTOPRAZOLE SODIUM 40 MG IV SOLR
40.0000 mg | Freq: Two times a day (BID) | INTRAVENOUS | Status: DC
  Administered 2021-09-19 – 2021-09-20 (×3): 40 mg via INTRAVENOUS
  Filled 2021-09-19 (×3): qty 10

## 2021-09-19 MED ORDER — LACTATED RINGERS IV BOLUS
500.0000 mL | Freq: Once | INTRAVENOUS | Status: AC
  Administered 2021-09-19: 500 mL via INTRAVENOUS

## 2021-09-19 MED ORDER — METOPROLOL TARTRATE 5 MG/5ML IV SOLN
5.0000 mg | Freq: Once | INTRAVENOUS | Status: DC
Start: 2021-09-19 — End: 2021-09-19
  Filled 2021-09-19: qty 5

## 2021-09-19 NOTE — ED Triage Notes (Signed)
Pt arrived POV from home c/o seeing bright red blood on the toilet when he went to use the bathroom this morning. Per pt's wife he was here a week ago and had to get 5 units of blood. Pt denies any pain.

## 2021-09-19 NOTE — Consult Note (Addendum)
Consultation Note   Referring Provider: [facility] PCP: Lajean Manes, MD Primary Gastroenterologist: Althia Forts  Reason for consultation: rectal bleeding  Hospital Day: 1   Attending physician's note  I have taken a history, reviewed the chart and examined the patient. I performed a substantive portion of this encounter, including complete performance of at least one of the key components, in conjunction with the APP. I agree with the APP's note, impression and recommendations.    71 year old male recently hospitalized with severe iron deficiency anemia s/p EGD and colonoscopy with cold snare admitted with large volume painless hematochezia  It is unusual to have post polypectomy bleed 10 days postprocedure but will need to exclude, diverticular hemorrhage also in the differential CTA to isolate and treat with embolization by IR as his presentation is suspicious for diverticular bleed Monitor hemoglobin and transfuse >7 Hold Xarelto Clear liquids and bowel prep if CTA is unable to isolate source of bleed N.p.o. after 5 AM tomorrow morning   The patient was provided an opportunity to ask questions and all were answered. The patient agreed with the plan and demonstrated an understanding of the instructions.  Damaris Hippo , MD [phone]     Assessment   71 yo male recently admitted with severe IDA / FOBT+. No active bleeding on EGD / colonoscopy . Several gastric / colon polyps removed via cold snare. Currently in ED with painless hematochezia on Xarelto. Could be a colonic post-polypectomy bleed.  Diverticular bleed not excluded. Upper GI bleed unlikely  SBP is not far from his baseline but HR 126 with history of AFIB.Hgb down slightly over a gram since recent hospital discharge ( 9.9 >> 8.5)  Afib, on Xarelto.  Last dose was last night  See PMH for additional medical problems   Plan   Keep NPO Blood transfusion is  in progress Hold Xarelto.  He may need colonoscopy to evaluate / treat possible post-polypectomy bleeding   HPI   Jonathon [name] is a 71 y.o. male with a past medical history significant for adenomatous colon polyps, CHF, CKD stage III, PVD s/p right lower extremity embolectomy and amputation of the right great toe 2021, DM, HTN, hypothyroidism, AFib on Xarelto. See PMH for any additional medical problems.  Mr Hallstrom was recently admitted with weakness, dark FOBT+ stool and severe iron deficiency anemia on Xarelto and ASA. Hgb was 4.5, ( it was 13.8 in March 2021). We saw him during that admission and did an EGD and colonoscopy with findings of gastritis / reactive gastropathy / parietal cell hyperplasia. Hyperplastic gastric polyps and several small adenomatous colon polyps. Duodenal biopsies negative for celiac. He was transfused 4-5 units of blood with improvement in hgb to 9.9   Patient presented to ED this am for evaluation of painless rectal bleeding. Bleeding started around midnight. He has since had 5 episodes of hematochezia.  Per RN in room he has been passing blood clots per rectum. He is tachycardic. SBP around 111.   Hgb 8.5 . BUN 13.  Last dose of Xarelto was last night.  As mentioned above, bleeding is painless.  He has not had any nausea/vomiting.  No associated shortness of breath or weakness.  Since he left the hospital it sounds  like he did have a couple of bowel movements which did not contain any blood / he takes a daily aspirin but no other NSAIDs.  He denies any history of GERD though pantoprazole is on his home med list.   Previous GI Evaluation     09/09/21 EGD and colonoscopy for IDA, Heme+ EGD -Non-obstructing Schatzki ring. Normal esophagus. Multiple gastric polyps -Gastritis. Normal duodenum Colonoscopy  -Nine 3 to 8 mm polyps in the transverse colon, in the ascending colon and in the cecum, removed with a cold snare. Resected and retrieved. - Three 3 to 8 mm  polyps in the proximal rectum and in the sigmoid colon, removed with a cold snare. Resected and retrieved. - Diverticulosis in the sigmoid colon. - The distal rectum and anal verge are normal on retroflexion view. - Could not fully intubate the terminal ileum due to looping in the ascending colon. Observed the IC valve for several minutes, without any blood noted coming from the more proximal GI tract.  FINAL MICROSCOPIC DIAGNOSIS:   A. DUODENUM, BIOPSY:  - Duodenal mucosa with no specific histopathologic changes  - Negative for increased intraepithelial lymphocytes or villous  architectural changes   B. STOMACH, POLYPECTOMY:  - Gastric hyperplastic polyp  - Negative for intestinal metaplasia or dysplasia   C. STOMACH, BIOPSY:  - Gastric antral mucosa with mild nonspecific reactive gastropathy  - Gastric oxyntic mucosa with parietal cell hyperplasia as can be seen  in hypergastrinemic states such as PPI therapy.  - Helicobacter pylori-like organisms are not identified on routine HE  stain   D. COLON, CECAL, ASCENDING AND TRANSVERSE, POLYPECTOMY:  - Tubular adenoma(s) without high-grade dysplasia or malignancy  - Inflammatory polyp   E. COLON, SIGMOID, POLYPECTOMY:  - Tubular adenoma(s) without high-grade dysplasia or malignancy  - Hyperplastic polyp     Recent Labs and Imaging No results found.  Labs:  Recent Labs    09/19/21 0814  WBC 6.8  HGB 8.5*  HCT 30.1*  PLT 318   Recent Labs    09/19/21 0814  NA 138  K 3.3*  CL 105  CO2 24  GLUCOSE 172*  BUN 14  CREATININE 1.48*  CALCIUM 8.9   Recent Labs    09/19/21 0814  PROT 5.7*  ALBUMIN 2.8*  AST 25  ALT 16  ALKPHOS 34*  BILITOT 1.3*   No results for input(s): HEPBSAG, HCVAB, HEPAIGM, HEPBIGM in the last 72 hours. Recent Labs    09/19/21 0814  LABPROT 32.1*  INR 3.1*    Past Medical History:  Diagnosis Date   Anemia due to blood loss, acute 2014   2nd GIB   Anxiety    CKD (chronic kidney  disease), stage III ([place]) 2014   Diabetes mellitus without complication ([place])    Gout    High cholesterol    Hypertension 2014   Hypothyroid    Neuropathy    PUD (peptic ulcer disease) 2014    Past Surgical History:  Procedure Laterality Date   ABDOMINAL AORTOGRAM W/LOWER EXTREMITY Bilateral 06/27/2019   Procedure: ABDOMINAL AORTOGRAM W/LOWER EXTREMITY;  Surgeon: Elam Dutch, MD;  Location: [facility];  Service: Vascular;  Laterality: Bilateral;   ADRENALECTOMY     AMPUTATION Right 07/18/2019   Procedure: AMPUTATION RIGHT GREAT TOE;  Surgeon: [name], MD;  Location: [facility];  Service: Vascular;  Laterality: Right;   BIOPSY  09/09/2021   Procedure: BIOPSY;  Surgeon: Lavena Bullion, DO;  Location: [facility];  Service: Gastroenterology;;   COLONOSCOPY WITH PROPOFOL N/A 09/09/2021   Procedure: COLONOSCOPY WITH PROPOFOL;  Surgeon: Lavena Bullion, DO;  Location: [facility];  Service: Gastroenterology;  Laterality: N/A;   EMBOLECTOMY Right 06/27/2019   Procedure: RIGHT LEG EMBOLECTOMY;  Surgeon: Elam Dutch, MD;  Location: [facility];  Service: Vascular;  Laterality: Right;   ESOPHAGOGASTRODUODENOSCOPY (EGD) WITH PROPOFOL N/A 09/09/2021   Procedure: ESOPHAGOGASTRODUODENOSCOPY (EGD) WITH PROPOFOL;  Surgeon: Lavena Bullion, DO;  Location: [facility];  Service: Gastroenterology;  Laterality: N/A;   POLYPECTOMY  09/09/2021   Procedure: POLYPECTOMY;  Surgeon: Lavena Bullion, DO;  Location: [facility];  Service: Gastroenterology;;  EGD and COLON    Family History  Problem Relation Age of Onset   Diabetes Mellitus II Maternal Aunt    Lung cancer Mother    Pancreatic cancer Sister     Prior to Admission medications   Medication Sig Start Date End Date Taking? Authorizing Provider  aspirin EC 81 MG EC tablet Take 1 tablet (81 mg total) by mouth daily. 07/01/19  Yes Dagoberto Ligas, PA-C  atorvastatin (LIPITOR) 80 MG tablet Take 1 tablet (80 mg total) by  mouth at bedtime. 11/01/20  Yes Kroeger, Daleen Snook M., PA-C  fenofibrate 54 MG tablet TAKE 1 TABLET(54 MG) BY MOUTH DAILY Patient taking differently: Take 54 mg by mouth daily. 09/05/21  Yes Lajean Manes, MD  guaiFENesin-dextromethorphan [facility] DM) 100-10 MG/5ML syrup Take 10 mLs by mouth every 4 (four) hours as needed for cough.   Yes [provider]  JARDIANCE 10 MG TABS tablet TAKE 1 TABLET BY MOUTH EVERY DAY Patient taking differently: Take 10 mg by mouth daily. 09/05/21  Yes Lajean Manes, MD  levothyroxine (SYNTHROID) 112 MCG tablet TAKE 1 TABLET (112 MCG TOTAL) BY MOUTH AT BEDTIME. 04/16/21  Yes Gaylan Gerold, DO  Melatonin 10 MG TABS Take 10 mg by mouth at bedtime.   Yes [provider]  metoprolol succinate (TOPROL-XL) 50 MG 24 hr tablet Take 50 mg by mouth daily. Take with or immediately following a meal.   Yes [provider]  pantoprazole (PROTONIX) 40 MG tablet TAKE 1 TABLET BY MOUTH TWICE A DAY Patient taking differently: Take 80 mg by mouth daily. 09/05/21  Yes Lajean Manes, MD  pregabalin (LYRICA) 75 MG capsule Take 2 capsules (150 mg total) by mouth at bedtime. Patient taking differently: Take 75 mg by mouth at bedtime. 05/22/21  Yes Lajean Manes, MD  sertraline (ZOLOFT) 100 MG tablet TAKE 1 TABLET BY MOUTH EVERY DAY Patient taking differently: Take 100 mg by mouth daily. 08/27/21  Yes Lajean Manes, MD  tamsulosin (FLOMAX) 0.4 MG CAPS capsule Take 0.4 mg by mouth daily after supper.   Yes [provider]  XARELTO 20 MG TABS tablet TAKE 1 TABLET BY MOUTH EVERY DAY Patient taking differently: Take 20 mg by mouth daily. 09/05/21  Yes Lajean Manes, MD  zolpidem (AMBIEN) 5 MG tablet Take 5 mg by mouth at bedtime as needed for sleep. 09/16/21  Yes [provider]    Current Facility-Administered Medications  Medication Dose Route Frequency Provider Last Rate Last Admin   0.9 %  sodium chloride infusion  10 mL/hr Intravenous Once Smoot, Sarah A,  PA-C       metoprolol tartrate (LOPRESSOR) injection 5 mg  5 mg Intravenous Once Smoot, Sarah A, PA-C       potassium chloride SA (KLOR-CON M) CR tablet 40 mEq  40 mEq Oral Once Smoot, Leary Roca, PA-C  Current Outpatient Medications  Medication Sig Dispense Refill   aspirin EC 81 MG EC tablet Take 1 tablet (81 mg total) by mouth daily.     atorvastatin (LIPITOR) 80 MG tablet Take 1 tablet (80 mg total) by mouth at bedtime. 90 tablet 3   fenofibrate 54 MG tablet TAKE 1 TABLET(54 MG) BY MOUTH DAILY (Patient taking differently: Take 54 mg by mouth daily.) 90 tablet 1   guaiFENesin-dextromethorphan (ROBITUSSIN DM) 100-10 MG/5ML syrup Take 10 mLs by mouth every 4 (four) hours as needed for cough.     JARDIANCE 10 MG TABS tablet TAKE 1 TABLET BY MOUTH EVERY DAY (Patient taking differently: Take 10 mg by mouth daily.) 90 tablet 1   levothyroxine (SYNTHROID) 112 MCG tablet TAKE 1 TABLET (112 MCG TOTAL) BY MOUTH AT BEDTIME. 90 tablet 0   Melatonin 10 MG TABS Take 10 mg by mouth at bedtime.     metoprolol succinate (TOPROL-XL) 50 MG 24 hr tablet Take 50 mg by mouth daily. Take with or immediately following a meal.     pantoprazole (PROTONIX) 40 MG tablet TAKE 1 TABLET BY MOUTH TWICE A DAY (Patient taking differently: Take 80 mg by mouth daily.) 180 tablet 1   pregabalin (LYRICA) 75 MG capsule Take 2 capsules (150 mg total) by mouth at bedtime. (Patient taking differently: Take 75 mg by mouth at bedtime.) 180 capsule 0   sertraline (ZOLOFT) 100 MG tablet TAKE 1 TABLET BY MOUTH EVERY DAY (Patient taking differently: Take 100 mg by mouth daily.) 90 tablet 4   tamsulosin (FLOMAX) 0.4 MG CAPS capsule Take 0.4 mg by mouth daily after supper.     XARELTO 20 MG TABS tablet TAKE 1 TABLET BY MOUTH EVERY DAY (Patient taking differently: Take 20 mg by mouth daily.) 30 tablet 5   zolpidem (AMBIEN) 5 MG tablet Take 5 mg by mouth at bedtime as needed for sleep.      Allergies as of 09/19/2021 - Review Complete  09/19/2021  Allergen Reaction Noted   Morphine and related Itching 06/08/2019    Social History   Socioeconomic History   Marital status: Married    Spouse name: Not on file   Number of children: Not on file   Years of education: Not on file   Highest education level: Not on file  Occupational History   Occupation: banking    Comment: retired  Tobacco Use   Smoking status: Former    Packs/day: 2.00    Years: 3.00    Pack years: 6.00    Types: Cigarettes   Smokeless tobacco: Former    Quit date: 05/08/1967  Vaping Use   Vaping Use: Never used  Substance and Sexual Activity   Alcohol use: Yes    Alcohol/week: 14.0 standard drinks    Types: 14 Shots of liquor per week    Comment: daily   Drug use: Yes    Types: Marijuana   Sexual activity: Not on file  Other Topics Concern   Not on file  Social History Narrative   Not on file   Social Determinants of Health   Financial Resource Strain: Not on file  Food Insecurity: Not on file  Transportation Needs: Not on file  Physical Activity: Not on file  Stress: Not on file  Social Connections: Not on file  Intimate Partner Violence: Not on file    Review of Systems: All systems reviewed and negative except where noted in HPI.  Physical Exam: Vital signs in last 24 hours:  Temp:  [97.7 F (36.5 C)] 97.7 F (36.5 C) (05/25 0748) Pulse Rate:  [104-128] 124 (05/25 1000) Resp:  [13-25] 25 (05/25 1000) BP: (89-157)/(44-129) 101/77 (05/25 1000) SpO2:  [94 %-100 %] 94 % (05/25 1000) Weight:  [111.1 kg] 111.1 kg (05/25 0804)    General:  Alert male in NAD Psych:  Pleasant, cooperative. Normal mood and affect Eyes: Pupils equal, no icterus. Conjunctive pink Ears:  Normal auditory acuity Nose: No deformity, discharge or lesions Neck:  Supple, no masses felt Lungs:  Clear to auscultation.  Heart:  Irreg rate and rhythm, 2+ BLE edema Abdomen:  Soft, nondistended, nontender, active bowel sounds, no masses felt Rectal :   Deferred, recently passes bright red blood with clots per RN Msk: Symmetrical without gross deformities.  Neurologic:  Alert, oriented, grossly normal neurologically Skin:  Intact without significant lesions.    Intake/Output from previous day: No intake/output data recorded. Intake/Output this shift:  No intake/output data recorded.    Active Problems:   * No active hospital problems. Tye Savoy, NP-C @  09/19/2021, 11:15 AM

## 2021-09-19 NOTE — Progress Notes (Signed)
Interval Progress Note:   CTA with evidence of active bleed located at the cecum/ascending colon. Consulted IR and discussed case with Dr. Laurence Ferrari. Recommended Xarelto reversal prior to consideration of embolization given location of bleed and amount of extravasation present.   Discussed with pharmacy. AndexXa available but reserved for ICH. Will give one time dose of Kcentra.   Spoke with patient's RN. Patient with at least 2 additional episodes of large-volume BRPBR. Remains hemodynamically stable at this time. CBC with hemoglobin of 7.9 despite 1 unit of pRBC already given. Will go ahead and order additional unit of pRBC now.   Signed,  Dr. Jose Persia Internal Medicine PGY-3   After 5pm on weekdays and 1pm on weekends: On Call pager [phone]  09/19/2021, 6:58 PM ont

## 2021-09-19 NOTE — H&P (Addendum)
Date: 09/19/2021               Patient Name:  Jonathon [name] MRN: [id number]  DOB: [birth year] Age / Sex: 71 y.o., male   PCP: Lajean Manes, MD              Medical Service: Internal Medicine Teaching Service              Attending Physician: Dr. Lottie Mussel, MD    First Contact: Clydell Hakim, MS 3 Pager: [phone]  Second Contact: Dr. Lajean Manes  Pager: [phone]  Third Contact Dr. Linwood Dibbles Pager: [phone]       After Hours (After 5p/  First Contact Pager: [phone]  weekends / holidays): Second Contact Pager: [phone]   Chief Complaint: GI bleed  History of Present Illness:   [name] is a 71 y.o. male with PMH significant for GI bleed, multiple polyps s/p recent polypectomy, atrial fibrillation on Xarelto, DM type 2, and CKD stage 3, presenting after multiple episodes of bloody stool. He had 5 episodes of bloody stool last night throughout the evening with blood running down his legs, however no recurrent episodes this morning. He denies any shortness of breath, weakness, or dizziness. He has had some cough over the past week due to a viral illness however no hemoptysis. No nausea, vomiting, abdominal pain. No abdominal pain with PO intake.  He has been compliant with all of his medications including his Xarelto and metoprolol.   Notably he was recently discharged from the hospital last week due to hemoglobin down to 4. He received 5 units of RBC during this admission and hemoglobin improved to 8.5. Most recent hemoglobin was 9.9 last week. During this admission he had an upper endoscopy and colonoscopy. Colonoscopy showed multiple polyps and patient is s/p polypectomy.   After our initial interview with the patient, we were called back as patient had an episode of bloody bowel movement with multiple large clots. He was unable to reach the bedside commode before having the bowel movement. He denies any weakness of dizziness.    ED Course:  Initial hg 8.5. He  had one episode of blood bowel movement in the ED. He was already started on 1 unit of pRBC. Hg later down-trended to 7.8. EKG shows atrial fibrillation with RVR, heart rate in the 120s. PT/INR 32.1/3.1, sodium 138, potassium 3.3. Blood pressure stable in the 110s/70s.   Review of Systems: A complete ROS was negative except as per HPI.   Past medical history:  GI bleed  Adenomatous polyps s/p polypectomy Atrial fibrillation  HFpEF (50-55% 05/2020) DVT Hypertension Chronic kidney disease stage 3 Type 2 diabetes mellitus  Peripheral artery disease GERD  Meds:  Xarelto 20 mg daily Aspirin 81 mg daily Fenofibrate 54 mg daily Robitussin 10 mL PRN Jardiance 10 mg daily Protonix 40 mg BID Lyrica 75 mg BID  Allergies: Allergies as of 09/19/2021 - Review Complete 09/19/2021  Allergen Reaction Noted   Morphine and related Itching 06/08/2019   Family History:  Aunt with diabetes mellitus Sister with pancreatic cancer  Social History:  Lives in [place] with his wife, Lelon Frohlich 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 rate 20, height 6' (1.829 m), weight 111.1 kg, SpO2 100 %.  Constitutional:  Ill appearing.  Sleeping but awoke easily to verbal/tactile stimuli. Alert and oriented x3 Cardiovascular:  Irregularly irregular rhythm with tachycardia. 1+ bilateral pitting edema of the lower extremities. Pulses are 2+ to the bilateral upper and lower extremities.  Respiratory:  No increased respiratory effort. Minimal inspiratory crackles in the bilateral bases. No wheezing or rhonchi.  Abdominal:  Soft and non-distended. No abdominal tenderness to palpation. Normal bowel sounds present. No organomegaly.  GU: Bright red blood actively coming from rectum. No other  external abnormalities noted. Digital rectal exam deferred.  Extremities: No asymmetry noted of the extremities. Skin:  Conjunctival pallor. No obvious lesions noted.  Psych:  Normal mood and behavior      Latest Ref Rng & Units 09/19/2021   12:40 PM 09/19/2021    8:14 AM 09/13/2021   11:46 AM  CBC  WBC 4.0 - 10.5 K/uL  6.8   5.7    Hemoglobin 13.0 - 17.0 g/dL 7.8   8.5   9.9    Hematocrit 39.0 - 52.0 % 26.5   30.1   34.0    Platelets 150 - 400 K/uL  318   96         Latest Ref Rng & Units 09/19/2021    8:14 AM 09/12/2021    3:13 PM 09/10/2021    1:21 AM  CMP  Glucose 70 - 99 mg/dL 172   169   142    BUN 8 - 23 mg/dL '14   13   14    '$ Creatinine 0.61 - 1.24 mg/dL 1.48   1.65   1.98    Sodium 135 - 145 mmol/L 138   141   138    Potassium 3.5 - 5.1 mmol/L 3.3   3.7   3.4    Chloride 98 - 111 mmol/L 105   104   110    CO2 22 - 32 mmol/L '24   19   19    '$ Calcium 8.9 - 10.3 mg/dL 8.9   8.8   8.7    Total Protein 6.5 - 8.1 g/dL 5.7      Total Bilirubin 0.3 - 1.2 mg/dL 1.3      Alkaline Phos 38 - 126 U/L 34      AST 15 - 41 U/L 25      ALT 0 - 44 U/L 16        PT/INR: 32.1/3.1  EKG: personally reviewed my interpretation is atrial fibrillation with RVR. Heart rate in the 120s.   Assessment & Plan by Problem: Active Problems:   Acute GI bleeding  Patient is a 71 y.o. male with PMH significant for GI bleed, s/p polypectomy and atrial fibrillation with RVR on Xarelto, who was admitted for multiple episodes of GI bleed last night and one in the ED, with down trending hemoglobin and tachycardia, now receiving 1 unit of RBC.    Acute GI bleed Acute on chronic blood loss anemia Recent history of ABLA and GI bleed with endoscopy and colonoscopy during last admission. Results notable for gastritis and numerous polyps s/p polypectomy. No active bleeding noted at that time. Currently presenting with 5 episodes of BRBPR with clots over the past 24 hours. He had 2 additional episodes here in the  hospital. Hemodynamically stable at this time. Hemoglobin was initially 8.5, later down-trended to 7.8. He has received 1 unit of pRBC. Xarelto and aspirin have been held. GI has been consulted. Differential includes post-polypectomy bleed versus small bowel bleed versus diverticular hemorrhage. Lower suspicion  for ulcer given negative recent endoscopies.  -Ordered for stat CTA. Depending on results, will consult IR for possible embolization.  -Patient will be NPO for possible colonoscopy tomorrow if CTA is unable to identify source of bleed.  -Continue to hold Xarelto and aspirin until control of bleeding source.  -Trend H&H q8h. Transfuse for hg less than 7.  -Continue IV protonix 40 mg BID -Continue to monitor  Atrial fibrillation with RVR HFpEF (50-55%) Patient with persistent atrial fibrillation, now with RVR, heart rate in the 120s. Also has history of HFpEF (50-55% 05/2020). He is on Xarelto for anticoagulation and metoprolol succinate 50 mg daily for rate control. He has history of recurrent GIB. On exam patient has 1+ bilateral pitting edema.  -Due to ongoing blood loss, will switch home long acting metoprolol succinate 50 mg daily to metoprolol tartrate 25 mg BID -Holding Xarelto for acute GIB -Continue to monitor on tele  Diabetes mellitus type 2 Chronic. On Jardiance 10 mg daily.  -Start insulin aspart 0-15 units TID with meals -Continue to monitor  Hypertension Patient is normotensive in the 110s/70s.  -Started on oral metoprolol 25 mg BID -Continue to monitor  Hypothyroidism  Chronic, on synthroid 112 mcg daily -Continue synthroid  DVT prophx: holding Xarelto due to acute GIB Diet: Regular (NPO at 5am tomorrow for colonoscopy) Bowel: PRN Code: Full  Prior to Admission Living Arrangement: Home Anticipated Discharge Location: Home Barriers to Discharge: Medical Workup   Dispo: Admit patient to Inpatient with expected length of stay greater than 2  midnights.  Signed: Clydell Hakim, Medical Student 09/19/2021, 1:40 PM  Pager: [phone]   Attestation for Student Documentation:  I personally was present and performed or re-performed the history, physical exam and medical decision-making activities of this service and have verified that the service and findings are accurately documented in the student's note.  Dr. Jose Persia Internal Medicine PGY-3  Pager: [phone] After 5pm on weekdays and 1pm on weekends: On Call pager [phone]  09/19/2021, 3:52 PM

## 2021-09-19 NOTE — Hospital Course (Addendum)
Acute GIB History of recent polypectomy Patient presented with 5 bloody bowel movements in the setting of recent hospital admission for GI bleed s/p polypectomy (discharged 1 week prior to this admission). He is on Xarelto for anticoagulation therapy with history of atrial fibrillation. CTA showed active bleeding in the ascending colon and cecum. He was given Kcentra for Xarelto reversal as Andexxa was only available for ICH. Colonoscopy performed 05/26 which showed slowly oozing ulcer in the ascending colon and cecum s/p clipping for hemostasis. Hemoglobin slowly improved from 7.8 to ___ after receiving 3 units of pRBC.   Atrial fibrillation with RVR Patient was in afib with RVR, heart rate in the 120s. He is on metoprolol succinate 50 mg daily for rate control and Xarelto for anticoagulation. Heart rate improved to 70-80s with control of GI bleed.   Colonoscopy procedure note 09/20/2021:  Impression: - Cratered ulcer with visible vessel at prior polypectomy sites in the ascending colon and in the cecum. Clips (MR conditional) were placed for hemostasis. - A few non bleeding ulcers in the sigmoid colon and in the transverse colon at prior polypectomy sites. - Diverticulosis in the sigmoid colon. - Non-bleeding internal hemorrhoids. - No specimens collected    Last night and into today with 5 episodes of bright red blood. Started running down his legs and into toilet bowel.   No hematemesis, hemoptysis, N/V/D, SOB, chest pain,   He has not taken any of his medications over the last couple nights.

## 2021-09-19 NOTE — ED Notes (Signed)
(  [phone] call

## 2021-09-19 NOTE — ED Provider Notes (Signed)
[facility] EMERGENCY DEPARTMENT Provider Note   CSN: [id number] Arrival date & time: 09/19/21  4496     History  Chief Complaint  Patient presents with   Rectal Bleeding    Jonathon [name] is a 71 y.o. male.  Patient with history of hypothyroidism, hyperlipidemia, DVT, hypertension, CKD, diabetes, CHF, A-fib on Xarelto, GERD, depression who presents today with complaints of rectal bleeding. He states that yesterday afternoon he noticed some spotting of blood in his underwear and when he subsequently went to have a bowel movement and noticed a significant amount of blood with clots in the toilet. He states that between last night and today he has had 5 total episodes of large bloody bowel movements. He states that he was recently admitted for same at the beginning of May where he was found to have a hemoglobin of 4.1 and received 5 units of blood. While admitted, the patient had an endoscopy which revealed multiple stomach polyps which were resected. He also had a colonoscopy which showed diverticulosis with multiple polyps throughout the colon which were resected without any signs of active bleeding.  Hemoglobin at discharge was 8.7. Patient states that after this procedure he was restarted on Xarelto and has been complaint with this regimen. He denies any fevers, chills, nausea, vomiting, or abdominal pain.   The history is provided by the patient. No language interpreter was used.  Rectal Bleeding     Home Medications Prior to Admission medications   Medication Sig Start Date End Date Taking? Authorizing Provider  aspirin EC 81 MG EC tablet Take 1 tablet (81 mg total) by mouth daily. 07/01/19   Dagoberto Ligas, PA-C  atorvastatin (LIPITOR) 80 MG tablet Take 1 tablet (80 mg total) by mouth at bedtime. 11/01/20   Kroeger, Lorelee Cover., PA-C  fenofibrate 54 MG tablet TAKE 1 TABLET(54 MG) BY MOUTH DAILY Patient taking differently: Take 54 mg by mouth daily. 09/05/21   Lajean Manes, MD  JARDIANCE 10 MG TABS tablet TAKE 1 TABLET BY MOUTH EVERY DAY Patient taking differently: Take 10 mg by mouth daily. 09/05/21   Lajean Manes, MD  levothyroxine (SYNTHROID) 112 MCG tablet TAKE 1 TABLET (112 MCG TOTAL) BY MOUTH AT BEDTIME. 04/16/21   Gaylan Gerold, DO  metoprolol succinate (TOPROL-XL) 50 MG 24 hr tablet Take 50 mg by mouth daily. Take with or immediately following a meal.    [provider]  pantoprazole (PROTONIX) 40 MG tablet TAKE 1 TABLET BY MOUTH TWICE A DAY Patient taking differently: Take 80 mg by mouth daily. 09/05/21   Lajean Manes, MD  pregabalin (LYRICA) 75 MG capsule Take 2 capsules (150 mg total) by mouth at bedtime. Patient taking differently: Take 75 mg by mouth at bedtime. 05/22/21   Lajean Manes, MD  sertraline (ZOLOFT) 100 MG tablet TAKE 1 TABLET BY MOUTH EVERY DAY Patient taking differently: Take 100 mg by mouth daily. 08/27/21   Lajean Manes, MD  XARELTO 20 MG TABS tablet TAKE 1 TABLET BY MOUTH EVERY DAY Patient taking differently: Take 20 mg by mouth daily. 09/05/21   Lajean Manes, MD      Allergies    Morphine and related    Review of Systems   Review of Systems  Gastrointestinal:  Positive for blood in stool and hematochezia.  All other systems reviewed and are negative.  Physical Exam Updated Vital Signs BP (!) 157/129 (BP Location: Left Arm)   Pulse (!) 104   Temp 97.7 F (36.5 C) (Oral)  Resp 20   Ht 6' (1.829 m)   Wt 111.1 kg   SpO2 94%   BMI 33.23 kg/m  Physical Exam Vitals and nursing note reviewed. Exam conducted with a chaperone present.  Constitutional:      General: He is not in acute distress.    Appearance: Normal appearance. He is normal weight. He is not ill-appearing, toxic-appearing or diaphoretic.  HENT:     Head: Normocephalic and atraumatic.  Cardiovascular:     Rate and Rhythm: Normal rate.  Pulmonary:     Effort: Pulmonary effort is normal. No respiratory distress.  Abdominal:     General: Abdomen is  flat.     Palpations: Abdomen is soft.     Tenderness: There is no abdominal tenderness.  Genitourinary:    Comments: Bright red blood present in the rectal vault Musculoskeletal:        General: Normal range of motion.     Cervical back: Normal range of motion.  Skin:    General: Skin is warm and dry.  Neurological:     General: No focal deficit present.     Mental Status: He is alert.  Psychiatric:        Mood and Affect: Mood normal.        Behavior: Behavior normal.    ED Results / Procedures / Treatments   Labs (all labs ordered are listed, but only abnormal results are displayed) Labs Reviewed  CBC WITH DIFFERENTIAL/PLATELET - Abnormal; Notable for the following components:      Result Value   RBC 3.85 (*)    Hemoglobin 8.5 (*)    HCT 30.1 (*)    MCV 78.2 (*)    MCH 22.1 (*)    MCHC 28.2 (*)    All other components within normal limits  COMPREHENSIVE METABOLIC PANEL - Abnormal; Notable for the following components:   Potassium 3.3 (*)    Glucose, Bld 172 (*)    Creatinine, Ser 1.48 (*)    Total Protein 5.7 (*)    Albumin 2.8 (*)    Alkaline Phosphatase 34 (*)    Total Bilirubin 1.3 (*)    GFR, Estimated 50 (*)    All other components within normal limits  PROTIME-INR - Abnormal; Notable for the following components:   Prothrombin Time 32.1 (*)    INR 3.1 (*)    All other components within normal limits  POC OCCULT BLOOD, ED - Abnormal; Notable for the following components:   Fecal Occult Bld POSITIVE (*)    All other components within normal limits  TYPE AND SCREEN    EKG None  Radiology No results found.  Procedures .Critical Care Performed by: Bud Face, PA-C Authorized by: Bud Face, PA-C   Critical care provider statement:    Critical care time (minutes):  35   Critical care start time:  09/19/2021 9:45 AM   Critical care end time:  09/19/2021 10:20 AM   Critical care time was exclusive of:  Separately billable procedures and  treating other patients   Critical care was necessary to treat or prevent imminent or life-threatening deterioration of the following conditions:  Circulatory failure   Critical care was time spent personally by me on the following activities:  Development of treatment plan with patient or surrogate, discussions with consultants, discussions with primary provider, evaluation of patient's response to treatment, examination of patient, obtaining history from patient or surrogate, ordering and review of laboratory studies, pulse oximetry, re-evaluation of patient's  condition and review of old charts   I assumed direction of critical care for this patient from another provider in my specialty: no     Care discussed with: admitting provider      Medications Ordered in ED Medications  metoprolol tartrate (LOPRESSOR) injection 5 mg (has no administration in time range)  0.9 %  sodium chloride infusion (has no administration in time range)  sodium chloride 0.9 % bolus 1,000 mL (1,000 mLs Intravenous New Bag/Given 09/19/21 5427)    ED Course/ Medical Decision Making/ A&P                           Medical Decision Making Amount and/or Complexity of Data Reviewed Labs: ordered.   This patient presents to the ED for concern of rectal bleeding, this involves an extensive number of treatment options, and is a complaint that carries with it a high risk of complications and morbidity.   Co morbidities that complicate the patient evaluation  Hx GI bleed at the beginning of the month on Xarelto   Additional history obtained:  Additional history obtained from previous ER and admission notes from last GI bleed at the beginning of May  Lab Tests:  I Ordered, and personally interpreted labs.  The pertinent results include:  Hemoglobin 8.5 down from 9.9 6 days ago. K 3.3. Hemoccult positive. Labs otherwise unchanged from baseline   Cardiac Monitoring: / EKG:  The patient was maintained on a cardiac  monitor.  I personally viewed and interpreted the cardiac monitored which showed an underlying rhythm of: afib unchanged from previous   Consultations Obtained:  I requested consultation with the [facility],  and discussed lab and imaging findings as well as pertinent plan - they recommend: admit to medicine with GI following   Problem List / ED Course / Critical interventions / Medication management  I ordered medication including fluids and 1 unit RBCs  for symptomatic anemia and hypotension, metoprolol for afib rvr Reevaluation of the patient after these medicines showed that the patient improved I have reviewed the patients home medicines and have made adjustments as needed   Test / Admission - Considered:  Patient presents today with complaints of hematochezia on Xarelto.  He continues to have bowel movements with bright red blood. Was just admitted for same at the beginning of May and had endoscopy and colonoscopy and resumed Xarelto after thi. Given this with patient's pallor and soft blood pressures, despite hemoglobin of 8.5 decision was made to proceed with 1 unit of transfused blood. Patient will require admission. He is understanding and amenable with plan.  Discussed patient with hospitalist who agrees to admit.   This is a shared visit with supervising physician Dr. Tomi Bamberger who has independently evaluated patient & provided guidance in evaluation/management/disposition, in agreement with care    Final Clinical Impression(s) / ED Diagnoses Final diagnoses:  Acute GI bleeding    Rx / DC Orders ED Discharge Orders     None         Nestor Lewandowsky 09/19/21 1119    Dorie Rank, MD 09/20/21 [phone]

## 2021-09-20 ENCOUNTER — Encounter (HOSPITAL_COMMUNITY): Payer: Self-pay | Admitting: Internal Medicine

## 2021-09-20 ENCOUNTER — Encounter (HOSPITAL_COMMUNITY)
Admission: EM | Disposition: A | Discharge status: Home / Self Care | Payer: Self-pay | Source: Home / Self Care | Attending: Internal Medicine

## 2021-09-20 ENCOUNTER — Observation Stay (HOSPITAL_COMMUNITY): Payer: Medicare Other | Admitting: Certified Registered Nurse Anesthetist

## 2021-09-20 ENCOUNTER — Observation Stay (HOSPITAL_BASED_OUTPATIENT_CLINIC_OR_DEPARTMENT_OTHER): Payer: Medicare Other | Admitting: Certified Registered Nurse Anesthetist

## 2021-09-20 DIAGNOSIS — E1122 Type 2 diabetes mellitus with diabetic chronic kidney disease: Secondary | ICD-10-CM | POA: Diagnosis present

## 2021-09-20 DIAGNOSIS — Z7901 Long term (current) use of anticoagulants: Secondary | ICD-10-CM | POA: Diagnosis not present

## 2021-09-20 DIAGNOSIS — F32A Depression, unspecified: Secondary | ICD-10-CM | POA: Diagnosis present

## 2021-09-20 DIAGNOSIS — K5730 Diverticulosis of large intestine without perforation or abscess without bleeding: Secondary | ICD-10-CM | POA: Diagnosis present

## 2021-09-20 DIAGNOSIS — Z885 Allergy status to narcotic agent status: Secondary | ICD-10-CM | POA: Diagnosis not present

## 2021-09-20 DIAGNOSIS — Z79899 Other long term (current) drug therapy: Secondary | ICD-10-CM | POA: Diagnosis not present

## 2021-09-20 DIAGNOSIS — Z833 Family history of diabetes mellitus: Secondary | ICD-10-CM | POA: Diagnosis not present

## 2021-09-20 DIAGNOSIS — I4811 Longstanding persistent atrial fibrillation: Secondary | ICD-10-CM | POA: Diagnosis not present

## 2021-09-20 DIAGNOSIS — Z7982 Long term (current) use of aspirin: Secondary | ICD-10-CM | POA: Diagnosis not present

## 2021-09-20 DIAGNOSIS — I4819 Other persistent atrial fibrillation: Secondary | ICD-10-CM | POA: Diagnosis present

## 2021-09-20 DIAGNOSIS — Y838 Other surgical procedures as the cause of abnormal reaction of the patient, or of later complication, without mention of misadventure at the time of the procedure: Secondary | ICD-10-CM | POA: Diagnosis present

## 2021-09-20 DIAGNOSIS — Z800 Family history of malignant neoplasm of digestive organs: Secondary | ICD-10-CM | POA: Diagnosis not present

## 2021-09-20 DIAGNOSIS — Z86718 Personal history of other venous thrombosis and embolism: Secondary | ICD-10-CM | POA: Diagnosis not present

## 2021-09-20 DIAGNOSIS — N183 Chronic kidney disease, stage 3 unspecified: Secondary | ICD-10-CM | POA: Diagnosis present

## 2021-09-20 DIAGNOSIS — K219 Gastro-esophageal reflux disease without esophagitis: Secondary | ICD-10-CM | POA: Diagnosis present

## 2021-09-20 DIAGNOSIS — Z7984 Long term (current) use of oral hypoglycemic drugs: Secondary | ICD-10-CM | POA: Diagnosis not present

## 2021-09-20 DIAGNOSIS — I130 Hypertensive heart and chronic kidney disease with heart failure and stage 1 through stage 4 chronic kidney disease, or unspecified chronic kidney disease: Secondary | ICD-10-CM | POA: Diagnosis present

## 2021-09-20 DIAGNOSIS — I10 Essential (primary) hypertension: Secondary | ICD-10-CM | POA: Diagnosis not present

## 2021-09-20 DIAGNOSIS — K633 Ulcer of intestine: Secondary | ICD-10-CM

## 2021-09-20 DIAGNOSIS — D638 Anemia in other chronic diseases classified elsewhere: Secondary | ICD-10-CM | POA: Diagnosis not present

## 2021-09-20 DIAGNOSIS — I5032 Chronic diastolic (congestive) heart failure: Secondary | ICD-10-CM | POA: Diagnosis present

## 2021-09-20 DIAGNOSIS — E876 Hypokalemia: Secondary | ICD-10-CM | POA: Diagnosis not present

## 2021-09-20 DIAGNOSIS — K5731 Diverticulosis of large intestine without perforation or abscess with bleeding: Secondary | ICD-10-CM | POA: Diagnosis not present

## 2021-09-20 DIAGNOSIS — Z87891 Personal history of nicotine dependence: Secondary | ICD-10-CM | POA: Diagnosis not present

## 2021-09-20 DIAGNOSIS — E039 Hypothyroidism, unspecified: Secondary | ICD-10-CM | POA: Diagnosis present

## 2021-09-20 DIAGNOSIS — K625 Hemorrhage of anus and rectum: Secondary | ICD-10-CM | POA: Diagnosis not present

## 2021-09-20 DIAGNOSIS — K922 Gastrointestinal hemorrhage, unspecified: Secondary | ICD-10-CM | POA: Diagnosis present

## 2021-09-20 DIAGNOSIS — D62 Acute posthemorrhagic anemia: Secondary | ICD-10-CM | POA: Diagnosis present

## 2021-09-20 DIAGNOSIS — K91840 Postprocedural hemorrhage and hematoma of a digestive system organ or structure following a digestive system procedure: Secondary | ICD-10-CM | POA: Diagnosis present

## 2021-09-20 DIAGNOSIS — Z79890 Hormone replacement therapy (postmenopausal): Secondary | ICD-10-CM | POA: Diagnosis not present

## 2021-09-20 DIAGNOSIS — K921 Melena: Secondary | ICD-10-CM

## 2021-09-20 DIAGNOSIS — K648 Other hemorrhoids: Secondary | ICD-10-CM | POA: Diagnosis not present

## 2021-09-20 DIAGNOSIS — E1151 Type 2 diabetes mellitus with diabetic peripheral angiopathy without gangrene: Secondary | ICD-10-CM | POA: Diagnosis present

## 2021-09-20 DIAGNOSIS — D500 Iron deficiency anemia secondary to blood loss (chronic): Secondary | ICD-10-CM | POA: Diagnosis not present

## 2021-09-20 HISTORY — PX: COLONOSCOPY WITH PROPOFOL: SHX5780

## 2021-09-20 HISTORY — PX: HEMOSTASIS CLIP PLACEMENT: SHX6857

## 2021-09-20 LAB — CBC
HCT: 31.8 % — ABNORMAL LOW (ref 39.0–52.0)
HCT: 33 % — ABNORMAL LOW (ref 39.0–52.0)
Hemoglobin: 9.8 g/dL — ABNORMAL LOW (ref 13.0–17.0)
Hemoglobin: 10.4 g/dL — ABNORMAL LOW (ref 13.0–17.0)
MCH: 24.6 pg — ABNORMAL LOW (ref 26.0–34.0)
MCH: 25.8 pg — ABNORMAL LOW (ref 26.0–34.0)
MCHC: 30.8 g/dL (ref 30.0–36.0)
MCHC: 31.5 g/dL (ref 30.0–36.0)
MCV: 79.9 fL — ABNORMAL LOW (ref 80.0–100.0)
MCV: 81.9 fL (ref 80.0–100.0)
Platelets: 227 10*3/uL (ref 150–400)
Platelets: 246 10*3/uL (ref 150–400)
RBC: 3.98 MIL/uL — ABNORMAL LOW (ref 4.22–5.81)
RBC: 4.03 MIL/uL — ABNORMAL LOW (ref 4.22–5.81)
WBC: 5.6 10*3/uL (ref 4.0–10.5)
WBC: 7.6 10*3/uL (ref 4.0–10.5)
nRBC: 0 % (ref 0.0–0.2)
nRBC: 0 % (ref 0.0–0.2)

## 2021-09-20 LAB — BASIC METABOLIC PANEL
Anion gap: 9 (ref 5–15)
BUN: 11 mg/dL (ref 8–23)
CO2: 22 mmol/L (ref 22–32)
Calcium: 8.5 mg/dL — ABNORMAL LOW (ref 8.9–10.3)
Chloride: 111 mmol/L (ref 98–111)
Creatinine, Ser: 1.32 mg/dL — ABNORMAL HIGH (ref 0.61–1.24)
GFR, Estimated: 58 mL/min — ABNORMAL LOW (ref >60)
Glucose, Bld: 95 mg/dL (ref 70–99)
Potassium: 3.2 mmol/L — ABNORMAL LOW (ref 3.5–5.1)
Sodium: 142 mmol/L (ref 135–145)

## 2021-09-20 LAB — PROTIME-INR
INR: 1.4 — ABNORMAL HIGH (ref 0.8–1.2)
Prothrombin Time: 16.9 seconds — ABNORMAL HIGH (ref 11.4–15.2)

## 2021-09-20 LAB — GLUCOSE, CAPILLARY
Glucose-Capillary: 92 mg/dL (ref 70–99)
Glucose-Capillary: 117 mg/dL — ABNORMAL HIGH (ref 70–99)
Glucose-Capillary: 124 mg/dL — ABNORMAL HIGH (ref 70–99)

## 2021-09-20 LAB — PREPARE RBC (CROSSMATCH)

## 2021-09-20 SURGERY — COLONOSCOPY WITH PROPOFOL
Anesthesia: Monitor Anesthesia Care

## 2021-09-20 MED ORDER — SODIUM CHLORIDE 0.9 % IV SOLN: INTRAVENOUS | Status: DC

## 2021-09-20 MED ORDER — PREGABALIN 75 MG PO CAPS
75.0000 mg | ORAL_CAPSULE | Freq: Every day | ORAL | Status: DC
Start: 2021-09-20 — End: 2021-09-21
  Administered 2021-09-20: 75 mg via ORAL
  Filled 2021-09-20: qty 1

## 2021-09-20 MED ORDER — PROPOFOL 10 MG/ML IV BOLUS
INTRAVENOUS | Status: DC | PRN
  Administered 2021-09-20: 40 mg via INTRAVENOUS
  Administered 2021-09-20: 20 mg via INTRAVENOUS

## 2021-09-20 MED ORDER — POTASSIUM CHLORIDE CRYS ER 20 MEQ PO TBCR
40.0000 meq | EXTENDED_RELEASE_TABLET | Freq: Two times a day (BID) | ORAL | Status: AC
  Administered 2021-09-20 (×2): 40 meq via ORAL
  Filled 2021-09-20 (×2): qty 2

## 2021-09-20 MED ORDER — SODIUM CHLORIDE 0.9% IV SOLUTION: Freq: Once | INTRAVENOUS | Status: AC

## 2021-09-20 MED ORDER — METOPROLOL SUCCINATE ER 50 MG PO TB24: 50.0000 mg | ORAL_TABLET | Freq: Every day | ORAL | Status: DC

## 2021-09-20 MED ORDER — MENTHOL 3 MG MT LOZG
1.0000 | LOZENGE | OROMUCOSAL | Status: DC | PRN
  Filled 2021-09-20: qty 9

## 2021-09-20 MED ORDER — OXYCODONE HCL 5 MG PO TABS: 5.0000 mg | ORAL_TABLET | Freq: Once | ORAL | Status: DC | PRN

## 2021-09-20 MED ORDER — LACTATED RINGERS IV BOLUS
1000.0000 mL | Freq: Once | INTRAVENOUS | Status: AC
  Administered 2021-09-21: 1000 mL via INTRAVENOUS

## 2021-09-20 MED ORDER — SODIUM CHLORIDE 0.9 % IV SOLN
510.0000 mg | Freq: Once | INTRAVENOUS | Status: AC
  Administered 2021-09-20: 510 mg via INTRAVENOUS
  Filled 2021-09-20 (×2): qty 17

## 2021-09-20 MED ORDER — PANTOPRAZOLE SODIUM 40 MG PO TBEC
40.0000 mg | DELAYED_RELEASE_TABLET | Freq: Two times a day (BID) | ORAL | Status: DC
  Administered 2021-09-21: 40 mg via ORAL
  Filled 2021-09-20: qty 1

## 2021-09-20 MED ORDER — ONDANSETRON HCL 4 MG/2ML IJ SOLN: 4.0000 mg | Freq: Four times a day (QID) | INTRAMUSCULAR | Status: DC | PRN

## 2021-09-20 MED ORDER — MELATONIN 5 MG PO TABS
10.0000 mg | ORAL_TABLET | Freq: Every day | ORAL | Status: DC
  Filled 2021-09-20: qty 2

## 2021-09-20 MED ORDER — PROPOFOL 500 MG/50ML IV EMUL
INTRAVENOUS | Status: DC | PRN
Start: 2021-09-20 — End: 2021-09-20
  Administered 2021-09-20: 75 ug/kg/min via INTRAVENOUS
  Administered 2021-09-20: 100 ug/kg/min via INTRAVENOUS

## 2021-09-20 MED ORDER — OXYCODONE HCL 5 MG/5ML PO SOLN
5.0000 mg | Freq: Once | ORAL | Status: DC | PRN
  Filled 2021-09-20: qty 5

## 2021-09-20 MED ORDER — ZOLPIDEM TARTRATE 5 MG PO TABS
5.0000 mg | ORAL_TABLET | Freq: Every evening | ORAL | Status: DC | PRN
  Administered 2021-09-20: 5 mg via ORAL
  Filled 2021-09-20: qty 1

## 2021-09-20 MED ORDER — PANTOPRAZOLE SODIUM 40 MG IV SOLR
40.0000 mg | Freq: Two times a day (BID) | INTRAVENOUS | Status: AC
  Administered 2021-09-20: 40 mg via INTRAVENOUS
  Filled 2021-09-20: qty 10

## 2021-09-20 MED ORDER — LACTATED RINGERS IV SOLN: INTRAVENOUS | Status: DC | PRN

## 2021-09-20 SURGICAL SUPPLY — 22 items

## 2021-09-20 NOTE — Anesthesia Postprocedure Evaluation (Signed)
Anesthesia Post Note  Patient: Jonathon [name]  Procedure(s) Performed: COLONOSCOPY WITH PROPOFOL HEMOSTASIS CLIP PLACEMENT     Patient location during evaluation: Endoscopy Anesthesia Type: MAC Level of consciousness: awake and alert Pain management: pain level controlled Vital Signs Assessment: post-procedure vital signs reviewed and stable Respiratory status: spontaneous breathing, nonlabored ventilation, respiratory function stable and patient connected to nasal cannula oxygen Cardiovascular status: stable and blood pressure returned to baseline Postop Assessment: no apparent nausea or vomiting Anesthetic complications: no   No notable events documented.  Last Vitals:  Vitals:   09/20/21 1059 09/20/21 1214  BP: 138/80 (!) 142/99  Pulse: 100 88  Resp: 18 11  Temp: (!) 36.4 C 36.6 C  SpO2: 95% 98%    Last Pain:  Vitals:   09/20/21 1214  TempSrc: Oral  PainSc:                  Clare Casto S

## 2021-09-20 NOTE — Transfer of Care (Signed)
Immediate Anesthesia Transfer of Care Note  Patient: Jonathon [name]  Procedure(s) Performed: COLONOSCOPY WITH PROPOFOL HEMOSTASIS CLIP PLACEMENT  Patient Location: PACU  Anesthesia Type:MAC  Level of Consciousness: drowsy  Airway & Oxygen Therapy: Patient Spontanous Breathing  Post-op Assessment: Report given to RN and Post -op Vital signs reviewed and stable  Post vital signs: Reviewed and stable  Last Vitals:  Vitals Value Taken Time  BP 116/77 09/20/21 1008  Temp    Pulse 112 09/20/21 1009  Resp 22 09/20/21 1009  SpO2 99 % 09/20/21 1009  Vitals shown include unvalidated device data.  Last Pain:  Vitals:   09/20/21 0850  TempSrc: Temporal  PainSc: 0-No pain         Complications: No notable events documented.

## 2021-09-20 NOTE — Anesthesia Procedure Notes (Signed)
Procedure Name: MAC Date/Time: 09/20/2021 9:05 AM Performed by: Carolan Clines, CRNA Pre-anesthesia Checklist: Patient identified, Emergency Drugs available, Suction available and Patient being monitored Patient Re-evaluated:Patient Re-evaluated prior to induction Oxygen Delivery Method: Simple face mask Dental Injury: Teeth and Oropharynx as per pre-operative assessment

## 2021-09-20 NOTE — Progress Notes (Signed)
Subjective: No acute overnight events   [name] is a 71 y.o. M with PMH significant for GI bleed, s/p recent polypectomy, atrial fibrillation on Xarelto, who was admitted for acute GI bleed with down-trending hemoglobin.   States he is feeling well and denies any acute complaints. He is s/p his colonoscopy this morning. He denies any shortness of breath, chest pain, abdominal pain, nausea, or vomiting. No weakness or dizziness. He reports understanding colonoscopy findings. He is updated on the plan for today and questions were addressed.   Objective:  Vital signs in last 24 hours: Vitals:   09/20/21 1023 09/20/21 1038 09/20/21 1059 09/20/21 1214  BP: 129/86 126/82 138/80 (!) 142/99  Pulse: (!) 106 92 100 88  Resp: '18 19 18 11  '$ Temp:  (!) 97.5 F (36.4 C) (!) 97.5 F (36.4 C) 97.9 F (36.6 C)  TempSrc:   Axillary Oral  SpO2: 96% 97% 95% 98%  Weight:      Height:       Weight change:  N/A  Intake/Output Summary (Last 24 hours) at 09/20/2021 1221 Last data filed at 09/20/2021 5102 Gross per 24 hour  Intake 2165.34 ml  Output 950 ml  Net 1215.34 ml   Physical Exam  Constitutional:  asleep but awakens easily to verbal/tactile stimuli. Alert and oriented x3.  Cardiovascular:  Irregularly irregular rhythm. No bilateral lower extremity swelling, improved from prior exam.  Respiratory:  Normal respiratory effort without accessory muscle use. Lungs are clear to auscultation Abdominal:  Normal bowel sounds. No abdominal tenderness to palpation. No organomegaly Extremities: no asymmetry noted on extremities Skin:  no obvious lesion noted. Pallor noted.  Psych:  Normal mood and behavior.      Latest Ref Rng & Units 09/20/2021    6:50 AM 09/19/2021    3:34 PM 09/19/2021   12:40 PM  CBC  WBC 4.0 - 10.5 K/uL 5.6   6.5     Hemoglobin 13.0 - 17.0 g/dL 9.8   7.9   7.8    Hematocrit 39.0 - 52.0 % 31.8   25.5   26.5    Platelets 150 - 400 K/uL 227   253          Latest  Ref Rng & Units 09/20/2021    6:50 AM 09/19/2021    8:14 AM 09/12/2021    3:13 PM  CMP  Glucose 70 - 99 mg/dL 95   172   169    BUN 8 - 23 mg/dL '11   14   13    '$ Creatinine 0.61 - 1.24 mg/dL 1.32   1.48   1.65    Sodium 135 - 145 mmol/L 142   138   141    Potassium 3.5 - 5.1 mmol/L 3.2   3.3   3.7    Chloride 98 - 111 mmol/L 111   105   104    CO2 22 - 32 mmol/L '22   24   19    '$ Calcium 8.9 - 10.3 mg/dL 8.5   8.9   8.8    Total Protein 6.5 - 8.1 g/dL  5.7     Total Bilirubin 0.3 - 1.2 mg/dL  1.3     Alkaline Phos 38 - 126 U/L  34     AST 15 - 41 U/L  25     ALT 0 - 44 U/L  16       Imaging:   Colonoscopy procedure note 09/20/2021:  Impression: -  Cratered ulcer with visible vessel at prior polypectomy sites in the ascending colon and in the cecum. Clips (MR conditional) were placed for hemostasis. - A few non bleeding ulcers in the sigmoid colon and in the transverse colon at prior polypectomy sites. - Diverticulosis in the sigmoid colon. - Non-bleeding internal hemorrhoids. - No specimens collected  CTA 09/19/2021: IMPRESSION: VASCULAR   1. Positive for active GI bleeding with suspected source at the cecum/ascending colon. 2. Aortic atherosclerosis without aneurysm, dissection or acute occlusive disease.   NON-VASCULAR   1. Gallstones without right upper quadrant inflammation 2. Slightly hyperdense left renal cyst but without significant change since April 2021, no follow-up imaging recommended. 3. Mild nodularity and tree-in-bud density at the right base suspicious for respiratory infection  Assessment/Plan:  Active Problems:   Acute GI bleeding   Hematochezia  Patient is a 72 y.o. male with PMH significant for GIB, s/p polypectomy, atrial fibrillation on Xarelto, who was admitted for acute GIB with down-trending hgb and is now s/p 3 units of pRBC.   Acute GI bleed 2/2 intestinal ulcers in the setting of recent polypectomy, resolved  Patient had 2 additional  episodes of large-volume GI bleeds yesterday since admission. Hemoglobin improved from 7.8 to 9.8 after receiving 3 units of pRBC since admission. CTA showed active bleed in the cecum and ascending colon. IR did not wish to intervene without anticoagulation reversal. Patient received Kcentra yesterday while awaiting colonoscopy. Colonoscopy revealing a 69m slowly oozing cratered ulcer in the ascending colon and cecum now s/p clips for hemostatis. No bleeding noted at the end of the procedure.  -Pros and cons of ongoing anticoagulation; would greatly benefit from ongoing anticoagulation given CHADVASc 5. Resume Xarelto on 09/22/21 per GI recs.  -Continue to hold aspirin for now -Trend CBC BID. and transfuse for hgb less than 7.  FFP indicated with next transfusion as it would be the 4th transfusion this month.  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 Currently on metoprolol tartrate 25 mg BID for rate control. Holding Xarelto due to acute GI bleed. Patient is euvolemic on exam.  -Will switch back to metoprolol succinate 50 mg daily starting tomorrow  -Continue holding Xarelto for the next 2 days, resume on 09/22/21 -Continue tele   Hypokalemia  Potassium 3.2 -Will replete K 40 mEq BID.  -Trend BMP  Diabetes mellitus type 2 Hyperlipidemia  Chronic ands stable. On Jardiance 10 mg daily.  -Continue insulin aspart 0-15 units TID with meals with CBG monitoring -Continue Lipitor 80 mg daily -Continue to monitor   Hypertension Patient is normotensive in the 110s/70s. On metoprolol tartrate 2 mg BID -Switch home metoprolol succinate 50 mg daily starting tmrw -Continue to monitor   Hypothyroidism  Chronic, on synthroid 112 mcg daily -Continue synthroid    LOS: 0 days   YClydell Hakim Medical Student 09/20/2021, 12:21 PM  Pager number:  [phone]  Attestation for Student Documentation:  I personally was present and performed or re-performed the history, physical exam and medical decision-making activities of this service and have verified that the service and findings are accurately documented in the student's note.  PLajean Manes MD 09/20/2021, 12:39 PM

## 2021-09-20 NOTE — Progress Notes (Signed)
   09/19/21 2321  Assess: MEWS Score  Temp 98.6 F (37 C)  BP (!) 126/93  Pulse Rate (!) 121  ECG Heart Rate (!) 133  Resp 18  SpO2 98 %  O2 Device Room Air  Patient Activity (if Appropriate) In bed  Assess: MEWS Score  MEWS Temp 0  MEWS Systolic 0  MEWS Pulse 3  MEWS RR 0  MEWS LOC 0  MEWS Score 3  MEWS Score Color Yellow  Assess: if the MEWS score is Yellow or Red  Were vital signs taken at a resting state? Yes  Focused Assessment No change from prior assessment  Early Detection of Sepsis Score *See Row Information* Low  MEWS guidelines implemented *See Row Information* Yes  Treat  Pain Scale 0-10  Pain Score 0  Take Vital Signs  Increase Vital Sign Frequency  Yellow: Q 2hr X 2 then Q 4hr X 2, if remains yellow, continue Q 4hrs  Escalate  MEWS: Escalate Yellow: discuss with charge nurse/RN and consider discussing with provider and RRT  Notify: Charge Nurse/RN  Name of Charge Nurse/RN Notified Manuela Schwartz, RN  Notify: Provider  Provider Name/Title Hal Hope,  MD (contacted teaching services at [facility] [address] Marva Panda)  Date Provider Notified 09/20/21  Time Provider Notified 2359  Method of Notification Page  Notification Reason Other (Comment) (yellow mews HR 126)  Provider response Other (Comment) (contact teaching services)  Date of Provider Response 09/20/21  Time of Provider Response 0001  Document  Patient Outcome Other (Comment) (stable)

## 2021-09-20 NOTE — Op Note (Signed)
[facility] [name] Procedure Date : 09/20/2021 MRN: [id number] Attending MD: Mauri Pole , MD Date of Birth: 1950/07/28 CSN: [id number] Age: 71 Admit Type: Inpatient Procedure:                Colonoscopy Indications:              Evaluation of unexplained GI bleeding presenting                            with Hematochezia Providers:                Mauri Pole, MD, Glori Bickers, RN,                            William Dalton, Technician Referring MD:              Medicines:                Monitored Anesthesia Care Complications:            No immediate complications. Estimated Blood Loss:     Estimated blood loss was minimal. Procedure:                Pre-Anesthesia Assessment:                           - Prior to the procedure, a History and Physical                            was performed, and patient medications and                            allergies were reviewed. The patient's tolerance of                            previous anesthesia was also reviewed. The risks                            and benefits of the procedure and the sedation                            options and risks were discussed with the patient.                            All questions were answered, and informed consent                            was obtained. Prior Anticoagulants: The patient has                            taken no previous anticoagulant or antiplatelet                            agents. ASA Grade Assessment: III - A patient with  severe systemic disease. After reviewing the risks                            and benefits, the patient was deemed in                            satisfactory condition to undergo the procedure.                           After obtaining informed consent, the colonoscope                            was passed under direct vision. Throughout the                            procedure, the  patient's blood pressure, pulse, and                            oxygen saturations were monitored continuously. The                            PCF-HQ190TL ([id number] Olympus peds colonoscope was                            introduced through the anus and advanced to the the                            cecum, identified by appendiceal orifice and                            ileocecal valve. The colonoscopy was performed                            without difficulty. The patient tolerated the                            procedure well. The quality of the bowel                            preparation was adequate. The ileocecal valve,                            appendiceal orifice, and rectum were photographed. Scope In: 9:23:15 AM Scope Out: 9:57:04 AM Scope Withdrawal Time: 0 hours 29 minutes 14 seconds  Total Procedure Duration: 0 hours 33 minutes 49 seconds  Findings:      The perianal and digital rectal examinations were normal.      60m cratered ulcer with slow ooze was found in the ascending colon and       two cratered ulcers with visible vessel were found in the cecum at sites       for prior polypectomies. Oozing was present. Stigmata of recent bleeding       were present. To stop active bleeding, a total of six hemostatic clips       were successfully placed (MR conditional).  There was no bleeding at the       end of the procedure.      A few three to four mm ulcers were found in the sigmoid colon and in the       transverse colon sites of prior polypectomies. No bleeding was present.       No stigmata of recent bleeding were seen.      Scattered small-mouthed diverticula were found in the sigmoid colon.      Non-bleeding internal hemorrhoids were found during retroflexion. The       hemorrhoids were small. Impression:               - Cratered ulcer with visible vessel at prior                            polypectomy sites in the ascending colon and in the                             cecum. Clips (MR conditional) were placed for                            hemostasis.                           - A few non bleeding ulcers in the sigmoid colon                            and in the transverse colon at prior polypectomy                            sites.                           - Diverticulosis in the sigmoid colon.                           - Non-bleeding internal hemorrhoids.                           - No specimens collected. Recommendation:           - Patient has a contact number available for                            emergencies. The signs and symptoms of potential                            delayed complications were discussed with the                            patient. Return to normal activities tomorrow.                            Written discharge instructions were provided to the                            patient.                           -  Mechanical soft diet for 2 days.                           - Continue present medications.                           - Resume Eliquis (apixaban) at prior dose in 2                            days. Refer to managing physician for further                            adjustment of therapy.                           - If Hgb remains stable this afternoon and no                            further bleeding, ok to discharge home                           - IV Felizardo Hoffmann for severe iron deficiency                           - Repeat CBC in 1 week Procedure Code(s):        --- Professional ---                           [phone], Colonoscopy, flexible; with control of                            bleeding, any method Diagnosis Code(s):        --- Professional ---                           K63.3, Ulcer of intestine                           K64.8, Other hemorrhoids                           K92.1, Melena (includes Hematochezia)                           K57.30, Diverticulosis of large intestine without                             perforation or abscess without bleeding CPT copyright 2019 American Medical Association. All rights reserved. The codes documented in this report are preliminary and upon coder review may  be revised to meet current compliance requirements. Mauri Pole, MD 09/20/2021 10:22:23 AM This report has been signed electronically. Number of Addenda: 0

## 2021-09-20 NOTE — TOC Initial Note (Signed)
Transition of Care [facility]) - Initial/Assessment Note    Patient Details  Name: Jonathon [name] MRN: [id number] Date of Birth: April 03, 1951  Transition of Care [facility]) CM/SW Contact:    Ninfa Meeker, RN Phone Number: 09/20/2021, 4:30 PM  Clinical Narrative:                 Transition of Care Screening Note:  Transition of Care [facility]) Department has reviewed patient and no TOC needs have been identified at this time. We will continue to monitor patient advancement through Interdisciplinary progressions and if new patient needs arise, please place a consult.          Patient Goals and CMS Choice        Expected Discharge Plan and Services                                                Prior Living Arrangements/Services                       Activities of Daily Living Home Assistive Devices/Equipment: None ADL Screening (condition at time of admission) Patient's cognitive ability adequate to safely complete daily activities?: Yes Is the patient deaf or have difficulty hearing?: No Does the patient have difficulty seeing, even when wearing glasses/contacts?: No Does the patient have difficulty concentrating, remembering, or making decisions?: Yes Patient able to express need for assistance with ADLs?: No Does the patient have difficulty dressing or bathing?: Yes Independently performs ADLs?: Yes (appropriate for developmental age) Does the patient have difficulty walking or climbing stairs?: Yes Weakness of Legs: Both Weakness of Arms/Hands: None  Permission Sought/Granted                  Emotional Assessment              Admission diagnosis:  Acute GI bleeding [K92.2] Patient Active Problem List   Diagnosis Date Noted   Hematochezia    Acute GI bleeding 09/19/2021   BPH (benign prostatic hyperplasia) 09/13/2021   Parainfluenza 09/13/2021   Hospital discharge follow-up 09/13/2021   Adenomatous polyp of ascending colon    Cecal  polyp    Diverticulosis of colon without hemorrhage    Multiple polyps of sigmoid colon    Iron deficiency anemia    Multiple gastric polyps    Gastritis and gastroduodenitis    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[place]) 08/12/2021   Diabetic peripheral neuropathy associated with type 2 diabetes mellitus ([place]) 07/05/2020   Gastroesophageal reflux disease with esophagitis 07/05/2020   Insomnia 07/05/2020   Male hypogonadism 07/05/2020   Major depressive disorder 07/05/2020   Health care maintenance 03/27/2020   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[place]) 12/16/2019   Erectile dysfunction 12/16/2019   Fecal incontinence 09/05/2019   Congestive heart disease ([place]) 07/15/2019   Atrial fibrillation ([place]) 07/15/2019   PAD (peripheral artery disease) ([place]) 06/24/2019   DVT, lower extremity, distal, acute, right ([place]) 06/09/2019   Essential hypertension 06/09/2019   CKD (chronic kidney disease) stage 3, GFR 30-59 ml/min ([place]) 06/09/2019   DM (diabetes mellitus), type 2 with renal complications ([place]) 38/75/6433   Thrombocytopenia ([place]) 06/09/2019   Hypothyroidism 06/09/2019   Gout 06/09/2019   Hyperlipidemia 06/09/2019   GAD (generalized anxiety disorder) 06/09/2019   Urge incontinence 10/10/2016   Acute blood loss anemia 09/12/2012   PCP:  Lajean Manes,  MD Pharmacy:   [id number] [place], [place] - [address] [place] [address]Phone: [phone]: [phone]    Social Determinants of Health (SDOH) Interventions    Readmission Risk Interventions    07/18/2019    2:54 PM  Readmission Risk Prevention Plan  Transportation Screening Complete  PCP or Specialist Appt within 3-5 Days Complete  Palliative Care Screening Not Applicable

## 2021-09-20 NOTE — Anesthesia Preprocedure Evaluation (Signed)
Anesthesia Evaluation  Patient identified by MRN, date of birth, ID band Patient awake    Reviewed: Allergy & Precautions, H&P , NPO status , Patient's Chart, lab work & pertinent test results  Airway Mallampati: II   Neck ROM: full    Dental   Pulmonary former smoker,    breath sounds clear to auscultation       Cardiovascular hypertension, + Peripheral Vascular Disease   Rhythm:regular Rate:Normal     Neuro/Psych PSYCHIATRIC DISORDERS Anxiety Depression  Neuromuscular disease    GI/Hepatic PUD,   Endo/Other  diabetesHypothyroidism   Renal/GU Renal InsufficiencyRenal disease     Musculoskeletal   Abdominal   Peds  Hematology  (+) Blood dyscrasia, anemia ,   Anesthesia Other Findings   Reproductive/Obstetrics                             Anesthesia Physical Anesthesia Plan  ASA: 3  Anesthesia Plan: MAC   Post-op Pain Management:    Induction: Intravenous  PONV Risk Score and Plan: 1 and Propofol infusion and Treatment may vary due to age or medical condition  Airway Management Planned: Simple Face Mask  Additional Equipment:   Intra-op Plan:   Post-operative Plan:   Informed Consent: I have reviewed the patients History and Physical, chart, labs and discussed the procedure including the risks, benefits and alternatives for the proposed anesthesia with the patient or authorized representative who has indicated his/her understanding and acceptance.     Dental advisory given  Plan Discussed with: Anesthesiologist, Surgeon and CRNA  Anesthesia Plan Comments:         Anesthesia Quick Evaluation

## 2021-09-20 NOTE — Consult Note (Signed)
   [facility] Inpatient Consult   09/20/2021  Jonathon [name] 30-Jul-1950 [id number]  [name] [ACO] Patient: Medicare ACO REACH  Primary Care Provider:  Lajean Manes, MD, [facility] Internal medicine, is an embedded provider with a Chronic Care Management team and program, and is listed for the transition of care follow up and appointments.  Patient has been active with Embedded practice RNCM noted in Encounters for chronic care management. Patient currently in observation status however reviewed for hospitalization and needs.  Patient HOH. Spoke with nurse, patient resting quietly.  Progress notes reviewed and no new needs noted at this time.  Plan:  Update for new needs will be sent to the [place] Management team and made aware of TOC follow up needs for post hospital care.  Please contact for further questions,  Natividad Brood, RN BSN [facility] Liaison  [phone] business mobile phone Toll free office [phone]  Fax number: [phone] [email or web address]

## 2021-09-21 DIAGNOSIS — Z87891 Personal history of nicotine dependence: Secondary | ICD-10-CM

## 2021-09-21 LAB — BPAM RBC
Blood Product Expiration Date: 202306142359
Blood Product Expiration Date: 202306142359
Blood Product Expiration Date: 202306142359
ISSUE DATE / TIME: 202305251129
ISSUE DATE / TIME: 202305252048
ISSUE DATE / TIME: 202305260215
Unit Type and Rh: 600
Unit Type and Rh: 600
Unit Type and Rh: 600

## 2021-09-21 LAB — CBC
HCT: 30 % — ABNORMAL LOW (ref 39.0–52.0)
HCT: 31.2 % — ABNORMAL LOW (ref 39.0–52.0)
Hemoglobin: 9.4 g/dL — ABNORMAL LOW (ref 13.0–17.0)
Hemoglobin: 9.7 g/dL — ABNORMAL LOW (ref 13.0–17.0)
MCH: 25.3 pg — ABNORMAL LOW (ref 26.0–34.0)
MCH: 25.3 pg — ABNORMAL LOW (ref 26.0–34.0)
MCHC: 31.3 g/dL (ref 30.0–36.0)
MCHC: 31.1 g/dL (ref 30.0–36.0)
MCV: 80.6 fL (ref 80.0–100.0)
MCV: 81.3 fL (ref 80.0–100.0)
Platelets: 249 10*3/uL (ref 150–400)
Platelets: 262 10*3/uL (ref 150–400)
RBC: 3.72 MIL/uL — ABNORMAL LOW (ref 4.22–5.81)
RBC: 3.84 MIL/uL — ABNORMAL LOW (ref 4.22–5.81)
WBC: 6.5 10*3/uL (ref 4.0–10.5)
WBC: 5.5 10*3/uL (ref 4.0–10.5)
nRBC: 0 % (ref 0.0–0.2)
nRBC: 0 % (ref 0.0–0.2)

## 2021-09-21 LAB — TYPE AND SCREEN
ABO/RH(D): A NEG
Antibody Screen: NEGATIVE
Unit division: 0
Unit division: 0
Unit division: 0

## 2021-09-21 LAB — BASIC METABOLIC PANEL
Anion gap: 7 (ref 5–15)
BUN: 7 mg/dL — ABNORMAL LOW (ref 8–23)
CO2: 22 mmol/L (ref 22–32)
Calcium: 8.3 mg/dL — ABNORMAL LOW (ref 8.9–10.3)
Chloride: 110 mmol/L (ref 98–111)
Creatinine, Ser: 1.21 mg/dL (ref 0.61–1.24)
GFR, Estimated: >60 mL/min (ref >60)
Glucose, Bld: 105 mg/dL — ABNORMAL HIGH (ref 70–99)
Potassium: 3.5 mmol/L (ref 3.5–5.1)
Sodium: 139 mmol/L (ref 135–145)

## 2021-09-21 LAB — MAGNESIUM
Magnesium: 1.3 mg/dL — ABNORMAL LOW (ref 1.7–2.4)
Magnesium: 1.6 mg/dL — ABNORMAL LOW (ref 1.7–2.4)

## 2021-09-21 LAB — GLUCOSE, CAPILLARY
Glucose-Capillary: 147 mg/dL — ABNORMAL HIGH (ref 70–99)
Glucose-Capillary: 119 mg/dL — ABNORMAL HIGH (ref 70–99)

## 2021-09-21 MED ORDER — METOPROLOL SUCCINATE ER 50 MG PO TB24
75.0000 mg | ORAL_TABLET | Freq: Every day | ORAL | Status: DC
  Administered 2021-09-21: 75 mg via ORAL
  Filled 2021-09-21: qty 1

## 2021-09-21 MED ORDER — MAGNESIUM SULFATE 2 GM/50ML IV SOLN
2.0000 g | Freq: Once | INTRAVENOUS | Status: AC
  Administered 2021-09-21: 2 g via INTRAVENOUS
  Filled 2021-09-21: qty 50

## 2021-09-21 MED ORDER — METOPROLOL TARTRATE 25 MG PO TABS: 25.0000 mg | ORAL_TABLET | Freq: Once | ORAL | Status: DC

## 2021-09-21 MED ORDER — METOPROLOL SUCCINATE ER 25 MG PO TB24: 75.0000 mg | ORAL_TABLET | Freq: Every day | ORAL | 2 refills | Status: DC

## 2021-09-21 NOTE — Discharge Summary (Signed)
Name: Jonathon [name] MRN: [id number] DOB: 03/07/1951 71 y.o. PCP: Lajean Manes, MD  Date of Admission: 09/19/2021  7:55 AM Date of Discharge: 09/21/2021 Attending Physician: Aldine Contes, MD  Discharge Diagnosis:  Active Problems:   Acute blood loss anemia   Acute GI bleeding   Hematochezia   Discharge Medications: Allergies as of 09/21/2021       Reactions   Morphine And Related Itching        Medication List     TAKE these medications    aspirin EC 81 MG tablet Take 1 tablet (81 mg total) by mouth daily.   atorvastatin 80 MG tablet Commonly known as: LIPITOR Take 1 tablet (80 mg total) by mouth at bedtime.   fenofibrate 54 MG tablet TAKE 1 TABLET(54 MG) BY MOUTH DAILY What changed: See the new instructions.   guaiFENesin-dextromethorphan 100-10 MG/5ML syrup Commonly known as: ROBITUSSIN DM Take 10 mLs by mouth every 4 (four) hours as needed for cough.   Jardiance 10 MG Tabs tablet Generic drug: empagliflozin TAKE 1 TABLET BY MOUTH EVERY DAY What changed: how much to take   levothyroxine 112 MCG tablet Commonly known as: SYNTHROID TAKE 1 TABLET (112 MCG TOTAL) BY MOUTH AT BEDTIME.   Melatonin 10 MG Tabs Take 10 mg by mouth at bedtime.   metoprolol succinate 25 MG 24 hr tablet Commonly known as: TOPROL-XL Take 3 tablets (75 mg total) by mouth daily. Start taking on: Sep 22, 2021 What changed:  medication strength how much to take additional instructions   pantoprazole 40 MG tablet Commonly known as: PROTONIX TAKE 1 TABLET BY MOUTH TWICE A DAY What changed:  how much to take when to take this   pregabalin 75 MG capsule Commonly known as: LYRICA Take 2 capsules (150 mg total) by mouth at bedtime. What changed: how much to take   sertraline 100 MG tablet Commonly known as: ZOLOFT TAKE 1 TABLET BY MOUTH EVERY DAY   tamsulosin 0.4 MG Caps capsule Commonly known as: FLOMAX Take 0.4 mg by mouth daily after supper.   Xarelto 20 MG  Tabs tablet Generic drug: rivaroxaban TAKE 1 TABLET BY MOUTH EVERY DAY What changed: how much to take   zolpidem 5 MG tablet Commonly known as: AMBIEN Take 5 mg by mouth at bedtime as needed for sleep.        Disposition and follow-up:   [name] was discharged from [facility] in Stable condition.  At the hospital follow up visit please address:  1. GI bleed: assess for recurrent episodes of hematochezia. Currently holding Xarelto until Tuesday 05/30.   2. Atrial fibrillation with RVR: Rates remained in the 100-120s after his colonoscopy. Metoprolol dose was increased from 50 mg to 75 mg daily. Holding Xarelto until 05/30 due to acute GI bleed. Ensure heart rate is stable on current metoprolol dose and adjust as needed.   3. Hypokalemia/hypomagnesemia : Potassium was low at 3.3, improved to 3.5 after supplementation. Magnesium improved from 1.2 to 1.6 after supplementation. He received an additional dose of IV magnesium prior to discharge.   4. Labs / imaging needed at time of follow-up: CBC, magnesium  5. Pending labs/ test needing follow-up: N/A  Follow-up Appointments: Dr. Bryan Lemma (GI) in 2 weeks  [facility] in 1 week  Hospital Course by problem list:  Acute GIB History of recent polypectomy Iron deficiency anemia Patient presented with 5 bloody bowel movements in the setting of recent hospital admission for GI bleed s/p polypectomy (  discharged 1 week prior to this admission). He is on Xarelto for anticoagulation therapy with history of atrial fibrillation. CTA showed active bleeding in the ascending colon and cecum. He was given Kcentra for Xarelto reversal as Andexxa was only available for ICH. Colonoscopy performed 05/26 which showed slowly oozing ulcer in the ascending colon and cecum s/p clipping for hemostasis. Received IV ferraheme x2 for severe iron deficiency anemia. Hemoglobin slowly improved from 7.8 to 9.7 at time of discharge. He received a  total of 3 units of pRBC during this admission and required done after his procedure. Patient was hemodynamically stable at time of discharge. He will hold Xarelto until Tuesday 05/30. Patient to follow up with Dr. Bryan Lemma in 2 weeks.   Atrial fibrillation with RVR Patient was in afib with RVR, heart rate in the 120s. He is on metoprolol succinate 50 mg daily for rate control and Xarelto for anticoagulation. Heart rate improved with control of GI bleed. Patient holding Xarelto during this admission and was instructed to restart on 05/30.   Hypokalemia Hypomagnesemia  Potassium was low at 3.3, improved to 3.5 with K supplement. Magensium was low at 1.2, improved to 1.6 with IV Mg supplement. Received an additional dose of Mg prior to discharge.   Discharge Subjective: Patient is feeling improved on morning rounds. He had one bowel movement with no blood noted. He tolerate PO intake without difficulty. No abdominal pain, nausea, or vomiting. He denies weakness or dizziness. No chest pain or shortness of breath.   Discharge Exam:   BP (!) 130/97 (BP Location: Left Arm)   Pulse 100   Temp 98.5 F (36.9 C) (Oral)   Resp 19   Ht 6' (1.829 m)   Wt 111.1 kg   SpO2 93%   BMI 33.22 kg/m   Physical Exam:   Constitutional: Awake and oriented x3. Resting comfortably in no acute distress.  Cardiovascular: Irregularly irregular. No swelling noted to the lower extremities.  Respiratory: No increased work of breathing. Lungs are clear to auscultation.  Abdominal: No abdominal tenderness to palpation. Normal bowel sounds present.  Extremities: No asymmetry noted to the extremities.  Skin: No obvious skin lesion noted.    Pertinent Labs, Studies, and Procedures:   Colonoscopy procedure note 09/20/2021:  Impression: - Cratered ulcer with visible vessel at prior polypectomy sites in the ascending colon and in the cecum. Clips (MR conditional) were placed for hemostasis. - A few non bleeding  ulcers in the sigmoid colon and in the transverse colon at prior polypectomy sites. - Diverticulosis in the sigmoid colon. - Non-bleeding internal hemorrhoids. - No specimens collected   CTA 09/19/2021: IMPRESSION: VASCULAR   1. Positive for active GI bleeding with suspected source at the cecum/ascending colon. 2. Aortic atherosclerosis without aneurysm, dissection or acute occlusive disease.   NON-VASCULAR   1. Gallstones without right upper quadrant inflammation 2. Slightly hyperdense left renal cyst but without significant change since April 2021, no follow-up imaging recommended. 3. Mild nodularity and tree-in-bud density at the right base suspicious for respiratory infection     Latest Ref Rng & Units 09/21/2021   11:00 AM 09/21/2021    3:55 AM 09/20/2021    7:19 PM  CBC  WBC 4.0 - 10.5 K/uL 5.5   6.5   7.6    Hemoglobin 13.0 - 17.0 g/dL 9.7   9.4   10.4    Hematocrit 39.0 - 52.0 % 31.2   30.0   33.0    Platelets 150 -  400 K/uL 262   249   246      Discharge Instructions: Hello Mr. Dyar,   Thank you for allowing [place] to be part of your care during your hospitalization. You were admitted for your GI bleed which has now resolved. The source of your bleeding was likely where your polyps were removed during your previous admission. These areas of bleeding have been clipped. Please follow-up with Dr. Bryan Lemma (GI specialist) in 2 weeks as well as our Internal Medicine Clinic in 1 week for your hospital follow-up.   Please hold your Xarelto and aspirin until Tuesday 05/30. You may resume your usual dose after this date.   Increase your metoprolol from 50 mg to 75 mg daily  Return to the emergency room for any worsening symptoms such as recurrent episodes of blood in your stool or vomiting blood.   Signed:  09/21/2021, 12:53 PM    Lacinda Axon, MD 09/21/2021, 2:57 PM IM Resident, PGY-2 Pager: [phone] Internal Medicine [place] 41:10

## 2021-09-21 NOTE — Progress Notes (Signed)
Subjective: No complaints of bleeding.  Feeling well.  Objective: Vital signs in last 24 hours: Temp:  [97.5 F (36.4 C)-99.4 F (37.4 C)] 98.5 F (36.9 C) (05/27 0300) Pulse Rate:  [88-132] 112 (05/27 0300) Resp:  [10-25] 10 (05/27 0300) BP: (96-151)/(53-99) 120/82 (05/27 0300) SpO2:  [90 %-100 %] 94 % (05/27 0300) Weight:  [111.1 kg] 111.1 kg (05/26 0850) Last BM Date : 09/20/21  Intake/Output from previous day: 05/26 0701 - 05/27 0700 In: 350 [I.V.:350] Out: -  Intake/Output this shift: No intake/output data recorded.  General appearance: alert and no distress GI: soft, non-tender; bowel sounds normal; no masses,  no organomegaly  Lab Results: Recent Labs    09/20/21 0650 09/20/21 1919 09/21/21 0355  WBC 5.6 7.6 6.5  HGB 9.8* 10.4* 9.4*  HCT 31.8* 33.0* 30.0*  PLT 227 246 249   BMET Recent Labs    09/19/21 0814 09/20/21 0650 09/21/21 0355  NA 138 142 139  K 3.3* 3.2* 3.5  CL 105 111 110  CO2 '24 22 22  '$ GLUCOSE 172* 95 105*  BUN 14 11 7*  CREATININE 1.48* 1.32* 1.21  CALCIUM 8.9 8.5* 8.3*   LFT Recent Labs    09/19/21 0814  PROT 5.7*  ALBUMIN 2.8*  AST 25  ALT 16  ALKPHOS 34*  BILITOT 1.3*   PT/INR Recent Labs    09/19/21 0814 09/20/21 0650  LABPROT 32.1* 16.9*  INR 3.1* 1.4*   Hepatitis Panel No results for input(s): HEPBSAG, HCVAB, HEPAIGM, HEPBIGM in the last 72 hours. C-Diff No results for input(s): CDIFFTOX in the last 72 hours. Fecal Lactopherrin No results for input(s): FECLLACTOFRN in the last 72 hours.  Studies/Results: CT ANGIO GI BLEED  Result Date: 09/19/2021 CLINICAL DATA:  Rectal bleeding EXAM: CTA ABDOMEN AND PELVIS WITHOUT AND WITH CONTRAST TECHNIQUE: Multidetector CT imaging of the abdomen and pelvis was performed using the standard protocol during bolus administration of intravenous contrast. Multiplanar reconstructed images and MIPs were obtained and reviewed to evaluate the vascular anatomy. RADIATION DOSE  REDUCTION: This exam was performed according to the departmental dose-optimization program which includes automated exposure control, adjustment of the mA and/or kV according to patient size and/or use of iterative reconstruction technique. CONTRAST:  100 cc Omnipaque 350 intravenous COMPARISON:  CT 08/19/2019 FINDINGS: VASCULAR Aorta: Normal caliber aorta without aneurysm, dissection, vasculitis or significant stenosis. Mild to moderate aortic atherosclerosis. Celiac: Patent without evidence of aneurysm, dissection, vasculitis or significant stenosis. SMA: Patent without evidence of aneurysm, dissection, vasculitis or significant stenosis. Renals: Single right and single left renal arteries. Origin calcification on the right without significant stenosis. Small hypodensity at the proximal left renal artery results in moderate luminal narrowing, series 15, image 96. this could be due to a web or eccentric small mural thrombus. IMA: Patent without evidence of aneurysm, dissection, vasculitis or significant stenosis. Inflow: Patent without evidence of aneurysm, dissection, vasculitis or significant stenosis. Proximal Outflow: Bilateral common femoral arteries are patent without evidence of aneurysm, dissection, vasculitis or significant stenosis. Review of the MIP images confirms the above findings. NON-VASCULAR Lower chest: Lung bases demonstrate no consolidation or effusion. Patchy slightly nodular and mild tree-in-bud density at the right lung base. Hepatobiliary: Gallstones. No focal hepatic abnormality. No biliary dilatation Pancreas: Unremarkable. No pancreatic ductal dilatation or surrounding inflammatory changes. Spleen: Normal in size without focal abnormality. 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 Left adrenal gland is within normal limits. Kidneys show no hydronephrosis. Bilateral renal cysts; slightly complex midpole left  renal cysts with internal increased density measuring 23  mm, series 4, image 40. The bladder is unremarkable Stomach/Bowel: Non contrasted images demonstrate mild intraluminal hyperdensity at the proximal transverse colon and ascending colon likely related to blood product. Positive for active extravasation on arterial images within the cecum, series 14, image 56, axial series 7 image 89 through 91. Venous delayed views demonstrate additional extravasation within the ascending colon, series 8, image 48. No acute bowel wall thickening. Lymphatic: No suspicious lymph node Reproductive: Prostate is unremarkable. Other: Negative for pelvic effusion or free air Musculoskeletal: Advanced degenerative changes at L4-L5 and L5-S1. No acute osseous abnormality IMPRESSION: VASCULAR 1. Positive for active GI bleeding with suspected source at the cecum/ascending colon. 2. Aortic atherosclerosis without aneurysm, dissection or acute occlusive disease. NON-VASCULAR 1. Gallstones without right upper quadrant inflammation 2. Slightly hyperdense left renal cyst but without significant change since April 2021, no follow-up imaging recommended. 3. Mild nodularity and tree-in-bud density at the right base suspicious for respiratory infection These results will be called to the ordering clinician or representative by the Radiologist Assistant, and communication documented in the PACS or [employer]. Electronically Signed   By: Donavan Foil M.D.   On: 09/19/2021 18:11    Medications: Scheduled:  atorvastatin  80 mg Oral QHS   insulin aspart  0-15 Units Subcutaneous TID WC   levothyroxine  112 mcg Oral Q0600   melatonin  10 mg Oral QHS   metoprolol succinate  75 mg Oral Daily   pantoprazole  40 mg Oral BID   pregabalin  75 mg Oral QHS   sertraline  100 mg Oral Daily   sodium chloride flush  3 mL Intravenous Q12H   tamsulosin  0.4 mg Oral QPC supper   Continuous:  sodium chloride     magnesium sulfate bolus IVPB      Assessment/Plan: 1) Post polypectomy bleed. 2) Anemia  - stable.   The patient's HGB is stable.  There appears to be equilibration.  No clinical evidence of bleeding.  Plan: 1) Okay to D/C home. 2) Follow up with Dr. Bryan Lemma in 2 weeks.  LOS: 1 day   Jonathon [name] D 09/21/2021, 7:39 AM

## 2021-09-21 NOTE — Progress Notes (Signed)
   09/20/21 2100  Assess: MEWS Score  Temp 98.7 F (37.1 C)  BP (!) 126/93  Pulse Rate (!) 132  ECG Heart Rate (!) 134  Resp 16  Level of Consciousness Alert  SpO2 94 %  O2 Device Room Air  Patient Activity (if Appropriate) In bed  Assess: MEWS Score  MEWS Temp 0  MEWS Systolic 0  MEWS Pulse 3  MEWS RR 0  MEWS LOC 0  MEWS Score 3  MEWS Score Color Yellow  Assess: if the MEWS score is Yellow or Red  Were vital signs taken at a resting state? Yes  Focused Assessment No change from prior assessment  Early Detection of Sepsis Score *See Row Information* Low  MEWS guidelines implemented *See Row Information* Yes  Treat  MEWS Interventions Other (Comment);Administered scheduled meds/treatments (V/S and assessement)  Pain Scale 0-10  Pain Score 0  Take Vital Signs  Increase Vital Sign Frequency  Yellow: Q 2hr X 2 then Q 4hr X 2, if remains yellow, continue Q 4hrs  Escalate  MEWS: Escalate Yellow: discuss with charge nurse/RN and consider discussing with provider and RRT  Notify: Charge Nurse/RN  Name of Charge Nurse/RN Notified Anne, RN  Date Charge Nurse/RN Notified 09/20/21  Time Charge Nurse/RN Notified 2139  Notify: Provider  Provider Name/Title aslam  Date Provider Notified 09/20/21  Time Provider Notified 2120  Method of Notification Page  Notification Reason Other (Comment) (yellow mews HR 132 Sepsis score 1)  Provider response No new orders  Date of Provider Response 09/20/21  Time of Provider Response 2127  Document  Patient Outcome Other (Comment) (Remains on department, pt stable and MD aware.)  Progress note created (see row info) Yes

## 2021-09-21 NOTE — Progress Notes (Addendum)
Orders for EKG and 1 liter 500 ml/hr of NS to be given by Aslam, MD due to HR sustaining in 130s.  Recommended pt to be given metoprolol to see if HR would go down. EKG showed Afib with RVR.   Dr. Lenon Ahmadi to administer NS and if HR is still elevated, will evaluate for heart rate controlling drip.   Administered NS at 250 ml/hr due to pt history of CHF.

## 2021-09-21 NOTE — Discharge Instructions (Addendum)
Hello Mr. Cardinal,    Thank you for allowing [place] to be part of your care during your hospitalization. You were admitted for your GI bleed which has now resolved. The source of your bleeding was likely where your polyps were removed during your previous admission. These areas of bleeding have been clipped. Please follow-up with Dr. Bryan Lemma (GI specialist) in 2 weeks as well as our Internal Medicine Clinic in 1 week for your hospital follow-up.    Please hold your Xarelto and aspirin until Tuesday 05/30. You may resume your usual dose after this date.   Increase your metoprolol from 50 mg daily to 75 mg daily.    Return to the emergency room for any worsening symptoms such as recurrent episodes of blood in your stool or vomiting blood.

## 2021-09-24 ENCOUNTER — Telehealth: Payer: Self-pay

## 2021-09-24 ENCOUNTER — Encounter (HOSPITAL_COMMUNITY): Payer: Self-pay | Admitting: Gastroenterology

## 2021-09-24 NOTE — Telephone Encounter (Signed)
Transition Care Management Follow-up Telephone Call Date of discharge and from where: Jonathon [name] 09/19/21-09/21/21 How have you been since you were released from the hospital? Per Jonathon [name], patient's wife, he is doing better but tends to sleep a lot and is currently asleep. Any questions or concerns? No  Items Reviewed: Did the pt receive and understand the discharge instructions provided? Yes  Medications obtained and verified? Yes  Other? No  Any new allergies since your discharge? No  Dietary orders reviewed? No Do you have support at home? Yes -wife Jonathon [name] and Equipment/Supplies: Were home health services ordered? no If so, what is the name of the agency? N/A  Has the agency set up a time to come to the patient's home? not applicable Were any new equipment or medical supplies ordered?  No What is the name of the medical supply agency? N/A Were you able to get the supplies/equipment? not applicable Do you have any questions related to the use of the equipment or supplies? No  Functional Questionnaire: (I = Independent and D = Dependent) ADLs: I  Bathing/Dressing- I  Meal Prep- D  Eating- I  Maintaining continence- I  Transferring/Ambulation- I  Managing Meds- I  Follow up appointments reviewed:  PCP Hospital f/u appt confirmed? Yes  Scheduled to see [name] on 09/26/21 @ 0945. [name] f/u appt confirmed? Yes  Scheduled to see [name] on 10/18/21 @ 2:20. Are transportation arrangements needed? No  If their condition worsens, is the pt aware to call PCP or go to the Emergency Dept.? Yes Was the patient provided with contact information for the PCP's office or ED? Yes Was to pt encouraged to call back with questions or concerns? Yes  [name], RN, BSN, CCM Care Management Coordinator [facility] Phone: [phone]: [phone]

## 2021-09-26 ENCOUNTER — Ambulatory Visit (INDEPENDENT_AMBULATORY_CARE_PROVIDER_SITE_OTHER): Payer: Medicare Other | Admitting: Internal Medicine

## 2021-09-26 ENCOUNTER — Other Ambulatory Visit: Payer: Self-pay

## 2021-09-26 DIAGNOSIS — Z7901 Long term (current) use of anticoagulants: Secondary | ICD-10-CM

## 2021-09-26 DIAGNOSIS — G4700 Insomnia, unspecified: Secondary | ICD-10-CM | POA: Diagnosis not present

## 2021-09-26 DIAGNOSIS — Z09 Encounter for follow-up examination after completed treatment for conditions other than malignant neoplasm: Secondary | ICD-10-CM

## 2021-09-26 DIAGNOSIS — Z87891 Personal history of nicotine dependence: Secondary | ICD-10-CM

## 2021-09-26 DIAGNOSIS — I4811 Longstanding persistent atrial fibrillation: Secondary | ICD-10-CM

## 2021-09-26 DIAGNOSIS — D649 Anemia, unspecified: Secondary | ICD-10-CM

## 2021-09-26 MED ORDER — TRAZODONE HCL 50 MG PO TABS: 50.0000 mg | ORAL_TABLET | Freq: Every day | ORAL | 0 refills | Status: DC

## 2021-09-26 NOTE — Assessment & Plan Note (Signed)
Up-titrated metoprolol succinate from 50 to 75 mg daily at time of hospital discharge for tachycardia from blood loss. Rate controlled with HR of 98 today. Anticoagulated with Xarelto 20 mg daily. No recurrent overt bleeding since hospitalization. Continue current management.

## 2021-09-26 NOTE — Assessment & Plan Note (Addendum)
Complains of long-standing insomnia. Was prescribed short course of Ambien at Urgent Care and wife reports excessive daytime sleepiness. Reports taking Trazodone previously, which helped but was discontinued for daytime drowsiness. Pt and wife requests trying Trazodone again as it provided best results.   Discontinue Ambien  Start Trazodone 50 mg at bedtime  Sleep hygiene discussed

## 2021-09-26 NOTE — Progress Notes (Addendum)
CC: hospital follow up 5/25-5/27 for GI 2/2 polypectomy   HPI:  [name] is a 71 y.o. male with a PMHx stated below and presents today for stated above. Please see the Encounters tab for problem-based Assessment & Plan for additional details.   Past Medical History:  Diagnosis Date   Anemia due to blood loss, acute 2014   2nd GIB   Anxiety    CKD (chronic kidney disease), stage III ([place]) 2014   Diabetes mellitus without complication (HCC)    Gout    High cholesterol    Hypertension 2014   Hypothyroid    Neuropathy    PUD (peptic ulcer disease) 2014    Current Outpatient Medications on File Prior to Visit  Medication Sig Dispense Refill   aspirin EC 81 MG EC tablet Take 1 tablet (81 mg total) by mouth daily.     atorvastatin (LIPITOR) 80 MG tablet Take 1 tablet (80 mg total) by mouth at bedtime. 90 tablet 3   fenofibrate 54 MG tablet TAKE 1 TABLET(54 MG) BY MOUTH DAILY (Patient taking differently: Take 54 mg by mouth daily.) 90 tablet 1   guaiFENesin-dextromethorphan (ROBITUSSIN DM) 100-10 MG/5ML syrup Take 10 mLs by mouth every 4 (four) hours as needed for cough.     JARDIANCE 10 MG TABS tablet TAKE 1 TABLET BY MOUTH EVERY DAY (Patient taking differently: Take 10 mg by mouth daily.) 90 tablet 1   levothyroxine (SYNTHROID) 112 MCG tablet TAKE 1 TABLET (112 MCG TOTAL) BY MOUTH AT BEDTIME. 90 tablet 0   Melatonin 10 MG TABS Take 10 mg by mouth at bedtime.     metoprolol succinate (TOPROL-XL) 25 MG 24 hr tablet Take 3 tablets (75 mg total) by mouth daily. 90 tablet 2   pantoprazole (PROTONIX) 40 MG tablet TAKE 1 TABLET BY MOUTH TWICE A DAY (Patient taking differently: Take 80 mg by mouth daily.) 180 tablet 1   pregabalin (LYRICA) 75 MG capsule Take 2 capsules (150 mg total) by mouth at bedtime. (Patient taking differently: Take 75 mg by mouth at bedtime.) 180 capsule 0   sertraline (ZOLOFT) 100 MG tablet TAKE 1 TABLET BY MOUTH EVERY DAY (Patient taking differently: Take  100 mg by mouth daily.) 90 tablet 4   tamsulosin (FLOMAX) 0.4 MG CAPS capsule Take 0.4 mg by mouth daily after supper.     XARELTO 20 MG TABS tablet TAKE 1 TABLET BY MOUTH EVERY DAY (Patient taking differently: Take 20 mg by mouth daily.) 30 tablet 5   No current facility-administered medications on file prior to visit.    Family History  Problem Relation Age of Onset   Diabetes Mellitus II Maternal Aunt    Lung cancer Mother    Pancreatic cancer Sister     Social History   Socioeconomic History   Marital status: Married    Spouse name: Not on file   Number of children: Not on file   Years of education: Not on file   Highest education level: Not on file  Occupational History   Occupation: banking    Comment: retired  Tobacco Use   Smoking status: Former    Packs/day: 2.00    Years: 3.00    Pack years: 6.00    Types: Cigarettes   Smokeless tobacco: Former    Quit date: 05/08/1967  Vaping Use   Vaping Use: Never used  Substance and Sexual Activity   Alcohol use: Yes    Alcohol/week: 14.0 standard drinks    Types:  82 Shots of liquor per week    Comment: daily   Drug use: Yes    Types: Marijuana   Sexual activity: Not on file  Other Topics Concern   Not on file  Social History Narrative   Not on file   Social Determinants of Health   Financial Resource Strain: Not on file  Food Insecurity: Not on file  Transportation Needs: Not on file  Physical Activity: Not on file  Stress: Not on file  Social Connections: Not on file  Intimate Partner Violence: Not on file    Review of Systems: ROS negative except for what is noted on the assessment and plan.  There were no vitals filed for this visit.   Physical Exam: Constitutional: alert, well-appearing, in NAD HENT: normocephalic, atraumatic, mucous membranes moist Eyes: conjunctiva non-erythematous, EOMI Cardiovascular: Irregularly irregular. No swelling noted to the lower extremities Pulmonary/Chest: normal  work of breathing on RA, LCTAB Abdominal: soft, non-tender to palpation, non-distended MSK: normal bulk and tone  Neurological: A&O x 3 Skin: warm and dry  Psych: normal behavior, normal affect    Assessment & Plan:   See Encounters Tab for problem based charting.  Patient discussed with Dr. Marzetta Board, MD  Internal Medicine Resident, PGY-1 [facility]

## 2021-09-26 NOTE — Assessment & Plan Note (Addendum)
Admitted 5/25 - 5/27 for GI bleed 2/2 polypectomy. Patient presented with 5 bloody bowel movements in the setting of recent hospital admission for GI bleed s/p polypectomy (discharged 1 week prior to this admission). He was on Xarelto for anticoagulation therapy with history of atrial fibrillation. CTA showed active bleeding in the ascending colon and cecum. He was given Kcentra for Xarelto reversal as Andexxa was only available for ICH. Colonoscopy performed 05/26 which showed slowly oozing ulcer in the ascending colon and cecum s/p clipping for hemostasis. Received IV ferraheme x2 for severe iron deficiency anemia. Hemoglobin slowly improved from 7.8 to 9.7 at time of discharge. He received a total of 3 units of pRBC during this admission and required none after his procedure. Patient was hemodynamically stable at time of discharge. He will instructed to hold Xarelto until Tuesday 05/30. Patient to follow up [place]. He reports feeling well since d/c. No fatigue, SHOB, or DOE. No s/s of ongoing blood loss. Will get CBC today. Has resumed his Xarelto and ASA.    - Discharge summary, procedure notes, and discharge labs personally reviewed - Discharge medications reviewed and reconciled - Reviewed plans for specialty follow-up including GI, scheduled for 6/23 -- CBC today  -- Continue Xarelto and daily ASA

## 2021-09-26 NOTE — Patient Instructions (Signed)
Please continue to take your medications including the Xarelto and aspirin  Please STOP taking the Ambien and start taking Trazodone nightly  Monitor for any ongoing signs of bleeding, and if you notice, please let us know!   I will call you with lab results

## 2021-09-27 LAB — CBC
Hematocrit: 38.9 % (ref 37.5–51.0)
Hemoglobin: 11.8 g/dL — ABNORMAL LOW (ref 13.0–17.7)
MCH: 25.7 pg — ABNORMAL LOW (ref 26.6–33.0)
MCHC: 30.3 g/dL — ABNORMAL LOW (ref 31.5–35.7)
MCV: 85 fL (ref 79–97)
Platelets: 342 10*3/uL (ref 150–450)
RBC: 4.59 x10E6/uL (ref 4.14–5.80)
RDW: 27.1 % — ABNORMAL HIGH (ref 11.6–15.4)
WBC: 5.1 10*3/uL (ref 3.4–10.8)

## 2021-09-27 NOTE — Progress Notes (Signed)
Internal Medicine Clinic Attending  Case discussed with Dr. Patel  At the time of the visit.  We reviewed the resident's history and exam and pertinent patient test results.  I agree with the assessment, diagnosis, and plan of care documented in the resident's note.  

## 2021-10-03 ENCOUNTER — Ambulatory Visit (INDEPENDENT_AMBULATORY_CARE_PROVIDER_SITE_OTHER): Payer: Medicare Other

## 2021-10-03 DIAGNOSIS — E1140 Type 2 diabetes mellitus with diabetic neuropathy, unspecified: Secondary | ICD-10-CM | POA: Diagnosis not present

## 2021-10-03 DIAGNOSIS — Z89411 Acquired absence of right great toe: Secondary | ICD-10-CM | POA: Diagnosis not present

## 2021-10-03 DIAGNOSIS — M2041 Other hammer toe(s) (acquired), right foot: Secondary | ICD-10-CM

## 2021-10-03 DIAGNOSIS — E1142 Type 2 diabetes mellitus with diabetic polyneuropathy: Secondary | ICD-10-CM | POA: Diagnosis not present

## 2021-10-03 DIAGNOSIS — L84 Corns and callosities: Secondary | ICD-10-CM

## 2021-10-03 NOTE — Progress Notes (Signed)
SITUATION Reason for Visit: Fitting of Diabetic Shoes & Insoles Patient / Caregiver Report:  Patient is satisfied with fit and function of shoes and insoles.  OBJECTIVE DATA: Patient History / Diagnosis:     ICD-10-CM   1. Diabetic peripheral neuropathy associated with type 2 diabetes mellitus (HCC)  E11.42     2. Hammertoe, bilateral  M20.41    M20.42       Change in Status:   None  ACTIONS PERFORMED: In-Person Delivery, patient was fit with: - 1x pair [id number] PDAC approved prefabricated Diabetic Shoes: Apex G8010M 11.5W - 3x pair [id number] PDAC approved vacuum formed custom diabetic insoles; [employer]: [id number]  Shoes and insoles were verified for structural integrity and safety. Patient wore shoes and insoles in office. Skin was inspected and free of areas of concern after wearing shoes and inserts. Shoes and inserts fit properly. Patient / Caregiver provided with ferbal instruction and demonstration regarding donning, doffing, wear, care, proper fit, function, purpose, cleaning, and use of shoes and insoles ' and in all related precautions and risks and benefits regarding shoes and insoles. Patient / Caregiver was instructed to wear properly fitting socks with shoes at all times. Patient was also provided with verbal instruction regarding how to report any failures or malfunctions of shoes or inserts, and necessary follow up care. Patient / Caregiver was also instructed to contact physician regarding change in status that may affect function of shoes and inserts.   Patient / Caregiver verbalized undersatnding of instruction provided. Patient / Caregiver demonstrated independence with proper donning and doffing of shoes and inserts.  PLAN Patient to follow with treating physician as recommended. Plan of care was discussed with and agreed upon by patient and/or caregiver. All questions were answered and concerns addressed.

## 2021-10-10 ENCOUNTER — Ambulatory Visit: Payer: Medicare Other | Admitting: Gastroenterology

## 2021-10-10 ENCOUNTER — Encounter: Payer: Self-pay | Admitting: Student

## 2021-10-10 ENCOUNTER — Ambulatory Visit (INDEPENDENT_AMBULATORY_CARE_PROVIDER_SITE_OTHER): Payer: Medicare Other | Admitting: Student

## 2021-10-10 DIAGNOSIS — B348 Other viral infections of unspecified site: Secondary | ICD-10-CM

## 2021-10-10 DIAGNOSIS — R531 Weakness: Secondary | ICD-10-CM | POA: Diagnosis not present

## 2021-10-10 DIAGNOSIS — G4700 Insomnia, unspecified: Secondary | ICD-10-CM | POA: Diagnosis not present

## 2021-10-10 DIAGNOSIS — D696 Thrombocytopenia, unspecified: Secondary | ICD-10-CM

## 2021-10-10 DIAGNOSIS — J122 Parainfluenza virus pneumonia: Secondary | ICD-10-CM | POA: Diagnosis not present

## 2021-10-10 NOTE — Patient Instructions (Signed)
Thank you, Mr.Sade Tomko for allowing [place] to provide your care today. Today we discussed your cough, congestion and fatigue.  We have scheduled for you to come in tomorrow morning at 945 to be evaluated.  My Chart Access: [email or web address]  Please follow-up tomorrow  Please make sure to arrive 15 minutes prior to your next appointment. If you arrive late, you may be asked to reschedule.    We look forward to seeing you next time. Please call our clinic at [phone] if you have any questions or concerns. The best time to call is Monday-Friday from 9am-4pm, but there is someone available 24/7. If after hours or the weekend, call the main hospital number and ask for the Internal Medicine Resident On-Call. If you need medication refills, please notify your pharmacy one week in advance and they will send [place] a request.   Thank you for letting us take part in your care. Wishing you the best!  Lacinda Axon, MD 10/10/2021, 11:42 AM IM Resident, PGY-2 [name] 41:10

## 2021-10-10 NOTE — Assessment & Plan Note (Addendum)
Patient evaluated today via televisit.  He has a history of fatigue and orthostatic symptoms. Spouse states patient continues to be fatigued even with minimal activity. Patient states he feels tired and weak in the legs but denies any shortness of breath, dizziness or feeling like he is going to pass out. States he has felt weaker since his recent GI procedure at the hospital. He also denies any hemoptysis, hematuria, melena or hematochezia. Blood pressure taken by spouse is normal at 114/68 with a heart rate of 84. Due to patient's recent GI bleed, plan to have patient come in for an in person visit tomorrow for further assessment and lab work.  Plan: -In person appointment tomorrow at 9:45 AM

## 2021-10-10 NOTE — Progress Notes (Signed)
   CC: Cough/congestion/fatigue  This is a telephone encounter between Jonathon [name] and Jonathon [name] on 10/10/2021 for cough/congestion/fatigue. The visit was conducted with [name] located at home and Jonathon [name] at [facility]. [name]'s identity was confirmed using their DOB and current address. [name] has consented to being evaluated through a telephone encounter and understands the associated risks (an examination cannot be done and [name] may need to come in for an appointment) / benefits (allows [name] to remain at home, decreasing exposure to coronavirus). I personally spent 14 minutes on medical discussion.   HPI:  [name] is a 71 y.o. with PMH as below.   Please see A&P for assessment of [name]'s acute and chronic medical conditions.   Past Medical History:  Diagnosis Date   Anemia due to blood loss, acute 2014   2nd GIB   Anxiety    CKD (chronic kidney disease), stage III ([place]) 2014   Diabetes mellitus without complication ([place])    Gout    High cholesterol    Hypertension 2014   Hypothyroid    Neuropathy    PUD (peptic ulcer disease) 2014   Review of Systems: Positive for dry cough, chest congestion, fatigue and leg weakness. Negative for fevers, chills, headaches, chest pain, hemoptysis, hematuria, bloody stools, wheezing or shortness of breath.   Assessment & Plan:   See Encounters Tab for problem based charting.  [name] discussed with Dr. Emelia Salisbury, MD, MPH

## 2021-10-10 NOTE — Assessment & Plan Note (Signed)
Patient diagnosed with parainfluenza virus 4 weeks ago while hospitalized. He had a febrile episode as well as cough. Febrile episode resolved. However according to spouse, patient has had chronic cough and congestion since then. States his symptoms have not worsened or improved. Patient currently endorses a dry cough, fatigue and chest congestion but denies any fevers, chills, wheezing, nasal discharge, sore throat, shortness of breath or headache.  They have tried over-the-counter cough and cold medications as well as Flonase without improvement in his symptoms.  Due to the lack of improvement in patient's upper respiratory tract infection with recent fatigue, I recommend patient comes in for an in person visit for further evaluation and management of his symptoms.  Plan: -In person visit tomorrow 6/16 at 9:45 AM

## 2021-10-11 ENCOUNTER — Encounter: Payer: Self-pay | Admitting: Student

## 2021-10-11 ENCOUNTER — Ambulatory Visit (INDEPENDENT_AMBULATORY_CARE_PROVIDER_SITE_OTHER): Payer: Medicare Other | Admitting: Student

## 2021-10-11 VITALS — BP 105/69 | HR 76 | Temp 98.6°F | Ht 72.0 in | Wt 231.2 lb

## 2021-10-11 DIAGNOSIS — R531 Weakness: Secondary | ICD-10-CM

## 2021-10-11 DIAGNOSIS — E1142 Type 2 diabetes mellitus with diabetic polyneuropathy: Secondary | ICD-10-CM | POA: Diagnosis not present

## 2021-10-11 DIAGNOSIS — I5022 Chronic systolic (congestive) heart failure: Secondary | ICD-10-CM | POA: Diagnosis not present

## 2021-10-11 DIAGNOSIS — D696 Thrombocytopenia, unspecified: Secondary | ICD-10-CM

## 2021-10-11 DIAGNOSIS — G4700 Insomnia, unspecified: Secondary | ICD-10-CM

## 2021-10-11 DIAGNOSIS — Z87891 Personal history of nicotine dependence: Secondary | ICD-10-CM

## 2021-10-11 DIAGNOSIS — B348 Other viral infections of unspecified site: Secondary | ICD-10-CM

## 2021-10-11 DIAGNOSIS — Z7984 Long term (current) use of oral hypoglycemic drugs: Secondary | ICD-10-CM

## 2021-10-11 MED ORDER — FLUTICASONE PROPIONATE 50 MCG/ACT NA SUSP: 1.0000 | Freq: Every day | NASAL | 1 refills | Status: DC

## 2021-10-11 MED ORDER — TRAZODONE HCL 100 MG PO TABS: 100.0000 mg | ORAL_TABLET | Freq: Every day | ORAL | 1 refills | Status: DC

## 2021-10-11 MED ORDER — MAGNESIUM 250 MG PO TABS: 250.0000 mg | ORAL_TABLET | Freq: Every day | ORAL | 0 refills | Status: DC

## 2021-10-11 MED ORDER — GUAIFENESIN-DM 100-10 MG/5ML PO SYRP: 10.0000 mL | ORAL_SOLUTION | ORAL | 2 refills | Status: DC | PRN

## 2021-10-11 MED ORDER — ALBUTEROL SULFATE HFA 108 (90 BASE) MCG/ACT IN AERS: 2.0000 | INHALATION_SPRAY | Freq: Four times a day (QID) | RESPIRATORY_TRACT | 2 refills | Status: DC | PRN

## 2021-10-11 NOTE — Progress Notes (Unsigned)
   CC: Cough, fatigue  HPI:  Jonathon [name] is a 71 y.o. male with PMH as below who presents to clinic for evaluation of ongoing cough and fatigue. Please see problem based charting for evaluation, assessment and plan.  Past Medical History:  Diagnosis Date   Anemia due to blood loss, acute 2014   2nd GIB   Anxiety    CKD (chronic kidney disease), stage III ([place]) 2014   Diabetes mellitus without complication ([place])    Gout    High cholesterol    Hypertension 2014   Hypothyroid    Neuropathy    PUD (peptic ulcer disease) 2014    Review of Systems:  Constitutional: Positive for fatigue and generalized weakness. Negative for fevers chills. HEENT: Positive for cough, postnasal drainage and chest congestion.  Respiratory: Negative for shortness of breath no wheezing. Cardiac: Negative for chest pain Abdomen: Negative for abdominal pain, constipation or diarrhea Neuro: Negative for headache, dizziness or weakness  Physical Exam: General: Pleasant, well-appearing elderly man accompanied by spouse.  No acute distress. HEENT: Moist mucous membrane. Oropharyngeal clear without exudate or erythema. Nontender sinuses. No lymphadenopathy. Cardiac: RRR. No murmurs, rubs or gallops. No LE edema Respiratory: Lungs CTAB. Mild transmitted upper airway sounds. No wheezing, rhonchi or crackles. No increased work of breathing. Abdominal: Soft, symmetric and non tender. Normal BS. Skin: Warm, dry and intact without rashes or lesions Extremities: Atraumatic. Full ROM. Palpable radial and DP pulses. Neuro: A&O x 3. Moves all extremities. Strength 4/5 in all extremities.  Normal sensation to gross touch. Psych: Appropriate mood and affect.  Vitals:   10/11/21 0939  BP: 105/69  Pulse: 76  Temp: 98.6 F (37 C)  TempSrc: Oral  SpO2: 99%  Weight: 231 lb 3.2 oz (104.9 kg)  Height: 6' (1.829 m)    Assessment & Plan:   See Encounters Tab for problem based charting.  Patient discussed  with Dr.  Thomes Cake, MD, MPH

## 2021-10-11 NOTE — Progress Notes (Signed)
Internal Medicine Clinic Attending  Case discussed with Dr. Amponsah  At the time of the visit.  We reviewed the resident's history and exam and pertinent patient test results.  I agree with the assessment, diagnosis, and plan of care documented in the resident's note.  

## 2021-10-11 NOTE — Patient Instructions (Addendum)
Thank you, Jonathon [name] for allowing [place] to provide your care today. Today we discussed your upper respiratory tract infection and weakness.  I am starting you on some new medications and inhaler to help with your respiratory symptoms.  I have ordered the following labs for you:   Lab Orders         CBC no Diff      I will call if any are abnormal. All of your labs can be accessed through "My Chart".  I have place a referrals to home health PT  I have ordered the following medication/changed the following medications:  Start Mucinex 600 mg twice daily Start Flonase 1 spray into nostrils daily Start albuterol as needed for shortness of breath or wheezing Start magnesium tablets 250 mg daily Try over-the-counter Zyrtec or Allegra  My Chart Access: [email or web address]  Please follow-up in 2 months or as needed  Please make sure to arrive 15 minutes prior to your next appointment. If you arrive late, you may be asked to reschedule.    We look forward to seeing you next time. Please call our clinic at [phone] if you have any questions or concerns. The best time to call is Monday-Friday from 9am-4pm, but there is someone available 24/7. If after hours or the weekend, call the main hospital number and ask for the Internal Medicine Resident On-Call. If you need medication refills, please notify your pharmacy one week in advance and they will send [place] a request.   Thank you for letting us take part in your care. Wishing you the best!  Lacinda Axon, MD 10/11/2021, 10:53 AM IM Resident, PGY-2 [name] 41:10

## 2021-10-12 LAB — CBC
Hematocrit: 40.6 % (ref 37.5–51.0)
Hemoglobin: 12.5 g/dL — ABNORMAL LOW (ref 13.0–17.7)
MCH: 27.1 pg (ref 26.6–33.0)
MCHC: 30.8 g/dL — ABNORMAL LOW (ref 31.5–35.7)
MCV: 88 fL (ref 79–97)
Platelets: 147 10*3/uL — ABNORMAL LOW (ref 150–450)
RBC: 4.61 x10E6/uL (ref 4.14–5.80)
RDW: 21.4 % — ABNORMAL HIGH (ref 11.6–15.4)
WBC: 5.3 10*3/uL (ref 3.4–10.8)

## 2021-10-13 ENCOUNTER — Encounter: Payer: Self-pay | Admitting: Student

## 2021-10-13 MED ORDER — DM-GUAIFENESIN ER 30-600 MG PO TB12: 1.0000 | ORAL_TABLET | Freq: Two times a day (BID) | ORAL | 0 refills | Status: DC

## 2021-10-13 NOTE — Assessment & Plan Note (Signed)
Patient here for refill on his trazodone.  States the trazodone 50 mg at bedtime was not enough to help him sleep so he was informed to take 100 mg nightly.  States that 100 mg dose has allowed him to sleep throughout the night. -Refill trazodone 100 mg at bedtime -Counseled about proper sleep hygiene

## 2021-10-13 NOTE — Assessment & Plan Note (Addendum)
Patient continues to have intermittent drop in his platelet count.  CBC today shows more than 50% drop in the platelet from 342 two weeks ago to 147. Patient endorsed some fatigue but denies any shortness of breath, hematochezia, hemoptysis, hematuria, or melena. No bruises identified on skin exam. Last blood smear during recent hospitalization showed dimorphic hypochromatic and normal chromatic populations consistent with post transfusion smear. Patient is scheduled to follow-up with GI next week. -Follow-up with GI as scheduled on 6/23 for further work-up of thrombocytopenia.  -At next office visit, check CMP, PT/INR and repeat blood smear if not completed by GI.

## 2021-10-13 NOTE — Assessment & Plan Note (Signed)
Patient here for evaluation for chronic fatigue and generalized weakness.  Patient reports he has always had problems with fatigue with minimal activity but since his recent 2 hospitalizations, he has felt much weaker. He denies any focal weakness, numbness or tingling. CBC shows improvement in hemoglobin to 12.5. No evidence of heart failure on exam.  TSH within normal limits 2 months ago. On exam, patient has 4/5 strength in all extremities but normal sensation to gross touch.  Patient slightly deconditioned from recent hospitalizations. He will benefit from home health PT to maximize strength and increase quality of life.   Plan: -Home health PT -Repeat TSH at next office visit

## 2021-10-13 NOTE — Assessment & Plan Note (Addendum)
Patient here for in person evaluation after a televisit yesterday for ongoing cough and chest congestion secondary to parainfluenza virus. Patient continues to endorse chronic dry cough for the last 5 to 6 weeks after being diagnosed with parainfluenza virus in the hospital. He has tried over-the-counter cough and cold medications without significant relief. He endorses associated fatigue, chest congestion and postnasal drainage mostly at night but denies any fevers, chills, headaches, shortness of breath, wheezing, loss of appetite or chest pain. On exam, patient with normal work of breathing. Oropharyngeal exam unremarkable. There is mild upper airway transmitted sounds but no wheezing, rhonchi or rales. No evidence of heart failure exacerbation on exam. No leukocytosis on CBC. Patient hemodynamically stable and afebrile.  Pneumonia and bacterial sinusitis unlikely at this point.  Patient's symptoms likely due to postviral cough. Patient has a history of allergies, gastritis and GERD with esophagitis which are likely contributing to his chronic cough.  Plan to continue conservative management for 1 more week and reassess.  Plan: -Start Mucinex DM twice daily for cough -Start albuterol inhaler as needed for shortness of breath or wheezing -Start Flonase 1 spray daily into both nostrils -OTC Allegra or Zyrtec -Follow-up in 1 week for reevaluation, can consider imaging to further evaluate lungs if symptoms have not improved.

## 2021-10-15 NOTE — Progress Notes (Signed)
Internal Medicine Clinic Attending  Case discussed with Dr. Amponsah  At the time of the visit.  We reviewed the resident's history and exam and pertinent patient test results.  I agree with the assessment, diagnosis, and plan of care documented in the resident's note.  

## 2021-10-18 ENCOUNTER — Encounter: Payer: Self-pay | Admitting: Gastroenterology

## 2021-10-18 ENCOUNTER — Ambulatory Visit (INDEPENDENT_AMBULATORY_CARE_PROVIDER_SITE_OTHER): Payer: Medicare Other | Admitting: Gastroenterology

## 2021-10-18 VITALS — BP 120/68 | HR 86 | Ht 72.0 in | Wt 232.0 lb

## 2021-10-18 DIAGNOSIS — D126 Benign neoplasm of colon, unspecified: Secondary | ICD-10-CM

## 2021-10-18 DIAGNOSIS — D500 Iron deficiency anemia secondary to blood loss (chronic): Secondary | ICD-10-CM | POA: Diagnosis not present

## 2021-10-18 DIAGNOSIS — Z7902 Long term (current) use of antithrombotics/antiplatelets: Secondary | ICD-10-CM | POA: Diagnosis not present

## 2021-10-19 ENCOUNTER — Encounter: Payer: Self-pay | Admitting: *Deleted

## 2021-10-20 ENCOUNTER — Other Ambulatory Visit: Payer: Self-pay | Admitting: Internal Medicine

## 2021-10-23 ENCOUNTER — Ambulatory Visit (INDEPENDENT_AMBULATORY_CARE_PROVIDER_SITE_OTHER): Payer: Medicare Other | Admitting: Student

## 2021-10-23 ENCOUNTER — Encounter: Payer: Self-pay | Admitting: Student

## 2021-10-23 ENCOUNTER — Other Ambulatory Visit: Payer: Self-pay

## 2021-10-23 VITALS — BP 93/67 | HR 62 | Temp 97.6°F | Wt 242.8 lb

## 2021-10-23 DIAGNOSIS — Z87891 Personal history of nicotine dependence: Secondary | ICD-10-CM | POA: Diagnosis not present

## 2021-10-23 DIAGNOSIS — E039 Hypothyroidism, unspecified: Secondary | ICD-10-CM

## 2021-10-23 DIAGNOSIS — I4811 Longstanding persistent atrial fibrillation: Secondary | ICD-10-CM | POA: Diagnosis not present

## 2021-10-23 DIAGNOSIS — D509 Iron deficiency anemia, unspecified: Secondary | ICD-10-CM | POA: Diagnosis not present

## 2021-10-23 DIAGNOSIS — E1121 Type 2 diabetes mellitus with diabetic nephropathy: Secondary | ICD-10-CM

## 2021-10-23 DIAGNOSIS — Z7984 Long term (current) use of oral hypoglycemic drugs: Secondary | ICD-10-CM

## 2021-10-23 DIAGNOSIS — I10 Essential (primary) hypertension: Secondary | ICD-10-CM

## 2021-10-23 DIAGNOSIS — R531 Weakness: Secondary | ICD-10-CM

## 2021-10-23 DIAGNOSIS — B348 Other viral infections of unspecified site: Secondary | ICD-10-CM | POA: Diagnosis not present

## 2021-10-23 NOTE — Patient Instructions (Addendum)
Thank you, Mr.Jaquin Mccard for allowing [place] to provide your care today. Today we discussed your upper respiratory tract infection, diabetes, blood pressure, generalized weakness, chronic fatigue and lack of balance.  I will make sure that the home health agency calls you to start home PT.  For your diabetes, I collected urine sample today to check for proteins. Your blood pressure has always been low but I want you make sure you are staying hydrated at all times.  I have ordered the following labs for you:  Lab Orders         Microalbumin / Creatinine Urine Ratio      I will call if any are abnormal. All of your labs can be accessed through "My Chart".  My Chart Access: [email or web address]  Please follow-up in 3 months  Please make sure to arrive 15 minutes prior to your next appointment. If you arrive late, you may be asked to reschedule.    We look forward to seeing you next time. Please call our clinic at [phone] if you have any questions or concerns. The best time to call is Monday-Friday from 9am-4pm, but there is someone available 24/7. If after hours or the weekend, call the main hospital number and ask for the Internal Medicine Resident On-Call. If you need medication refills, please notify your pharmacy one week in advance and they will send [place] a request.   Thank you for letting us take part in your care. Wishing you the best!  Lacinda Axon, MD 10/23/2021, 3:03 PM IM Resident, PGY-2 [name] 41:10

## 2021-10-23 NOTE — Progress Notes (Signed)
   CC: Follow-up, generalized weakness  HPI:  [name] is a 70 y.o. male with PMH as below who presents to clinic to follow-up on his upper respiratory tract infection and generalized weakness/loss of balance. Please see problem based charting for evaluation, assessment and plan.  Past Medical History:  Diagnosis Date   Anemia due to blood loss, acute 2014   2nd GIB   Anxiety    CKD (chronic kidney disease), stage III ([place]) 2014   Diabetes mellitus without complication ([place])    Gout    High cholesterol    Hypertension 2014   Hypothyroid    Neuropathy    PUD (peptic ulcer disease) 2014    Review of Systems:  Constitutional: Positive for falls, chronic fatigue and loss of balance. Negative for fevers or chills. HEENT: Positive for occasional nasal congestion Respiratory: Positive for occasional cough. Negative for shortness of breath or wheezing GU: Negative for melena or hematochezia Neuro: Negative for headache, dizziness or weakness  Physical Exam: General: Pleasant, well-appearing elderly man accompanied by wife.  No acute distress. Cardiac: RRR. No murmurs, rubs or gallops. No LE edema Respiratory: Lungs CTAB. No wheezing or crackles.  Decreased air movement at the bases.  Abdominal: Soft, symmetric and non tender. Normal BS. Skin: Warm, dry and intact without rashes or lesions Extremities: Atraumatic. Full ROM. Palpable radial and DP pulses. Neuro: A&O x 3. Moves all extremities. Strength 4/5 in all extremities. Normal sensation to gross touch. Psych: Appropriate mood and affect.  Vitals:   10/23/21 1423  BP: (!) 105/57  Pulse: (!) 58  Temp: 97.6 F (36.4 C)  TempSrc: Oral  SpO2: 98%  Weight: 242 lb 12.8 oz (110.1 kg)    Assessment & Plan:   See Encounters Tab for problem based charting.  Patient discussed with Dr. Lorenz Coaster, MD, MPH

## 2021-10-24 ENCOUNTER — Encounter: Payer: Self-pay | Admitting: Student

## 2021-10-24 LAB — MICROALBUMIN / CREATININE URINE RATIO
Creatinine, Urine: 131 mg/dL
Microalb/Creat Ratio: 13 mg/g creat (ref 0–29)
Microalbumin, Urine: 16.4 ug/mL

## 2021-10-24 NOTE — Assessment & Plan Note (Signed)
TSH within normal limits in April 2023. Consider repeating TSH if patient remains fatigued after home PT. -Continue levothyroxine 112 mcg daily

## 2021-10-24 NOTE — Assessment & Plan Note (Signed)
Patient here with wife to follow-up on his upper respiratory tract infection. Patient states his symptoms have improved. States the Mucinex has helped with the nasal congestion, cough and postnasal drip. He has not been needing the albuterol inhaler. -Continue symptomatic management

## 2021-10-24 NOTE — Assessment & Plan Note (Signed)
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 Over the last months, SBP has ranged from the 140s to the 90s. He continues to have chronic fatigue with occasional loss of balance but denies any lightheadedness, chest pain, palpitations or blurry vision.  We will continue to monitor closely.  Plan: -Continue metoprolol 75 mg daily, decrease to 50 mg at next office visit if patient is bradycardic and BP remains soft.

## 2021-10-24 NOTE — Assessment & Plan Note (Signed)
Hemoglobin stable at 12.5 two weeks ago. Pending capsule endoscopy by GI on 7/6.  -Continue to monitor closely

## 2021-10-24 NOTE — Assessment & Plan Note (Signed)
Rate controlled and on Xarelto for anticoagulation.  Heart rate has been stable in the high 50s to the 80s since hospital discharge last month.  Patient continues to have fatigue with occasional loss of balance but no chest pain, palpitations or dizziness. -Continue metoprolol 75 mg daily -Continue Xarelto 20 mg daily -Advised to follow-up with cardiology next month

## 2021-10-24 NOTE — Assessment & Plan Note (Addendum)
Patient continues to feel generalized weakness and fatigue. He has occasional loss of balance likely secondary to right great toe amputation in 2021. Patient states he often have to hold onto the walls while ambulating due to feeling wobbly on his feet. He denies any lightheadedness or presyncope but reports that he fell about 2 weeks ago. He was able to brace himself and denied hitting his head. Per wife, they are still waiting for a phone call from the home health agency.  Patient remains weak and at risk of further falls. He would benefit from home health PT. Plan to follow-up on home PT referral.  Plan: -Follow-up on home PT referral -Clinic follow-up after home PT sessions

## 2021-10-24 NOTE — Assessment & Plan Note (Signed)
Remains well controlled with A1c of 6.5% on 5/11. Urine microalbumin creatinine ratio today within normal limits. -Continue Jardiance 10 mg daily -Repeat A1c in 2 months

## 2021-10-26 DIAGNOSIS — D696 Thrombocytopenia, unspecified: Secondary | ICD-10-CM | POA: Diagnosis not present

## 2021-10-26 DIAGNOSIS — K279 Peptic ulcer, site unspecified, unspecified as acute or chronic, without hemorrhage or perforation: Secondary | ICD-10-CM | POA: Diagnosis not present

## 2021-10-26 DIAGNOSIS — N183 Chronic kidney disease, stage 3 unspecified: Secondary | ICD-10-CM | POA: Diagnosis not present

## 2021-10-26 DIAGNOSIS — E1122 Type 2 diabetes mellitus with diabetic chronic kidney disease: Secondary | ICD-10-CM | POA: Diagnosis not present

## 2021-10-26 DIAGNOSIS — Z9181 History of falling: Secondary | ICD-10-CM | POA: Diagnosis not present

## 2021-10-26 DIAGNOSIS — I4891 Unspecified atrial fibrillation: Secondary | ICD-10-CM | POA: Diagnosis not present

## 2021-10-26 DIAGNOSIS — M109 Gout, unspecified: Secondary | ICD-10-CM | POA: Diagnosis not present

## 2021-10-26 DIAGNOSIS — G4700 Insomnia, unspecified: Secondary | ICD-10-CM | POA: Diagnosis not present

## 2021-10-26 DIAGNOSIS — E1142 Type 2 diabetes mellitus with diabetic polyneuropathy: Secondary | ICD-10-CM | POA: Diagnosis not present

## 2021-10-26 DIAGNOSIS — F419 Anxiety disorder, unspecified: Secondary | ICD-10-CM | POA: Diagnosis not present

## 2021-10-26 DIAGNOSIS — Z7984 Long term (current) use of oral hypoglycemic drugs: Secondary | ICD-10-CM | POA: Diagnosis not present

## 2021-10-26 DIAGNOSIS — Z7901 Long term (current) use of anticoagulants: Secondary | ICD-10-CM | POA: Diagnosis not present

## 2021-10-26 DIAGNOSIS — Z7982 Long term (current) use of aspirin: Secondary | ICD-10-CM | POA: Diagnosis not present

## 2021-10-26 DIAGNOSIS — I130 Hypertensive heart and chronic kidney disease with heart failure and stage 1 through stage 4 chronic kidney disease, or unspecified chronic kidney disease: Secondary | ICD-10-CM | POA: Diagnosis not present

## 2021-10-26 DIAGNOSIS — I5022 Chronic systolic (congestive) heart failure: Secondary | ICD-10-CM | POA: Diagnosis not present

## 2021-10-26 DIAGNOSIS — E039 Hypothyroidism, unspecified: Secondary | ICD-10-CM | POA: Diagnosis not present

## 2021-10-31 ENCOUNTER — Encounter: Payer: Self-pay | Admitting: Gastroenterology

## 2021-10-31 ENCOUNTER — Ambulatory Visit (INDEPENDENT_AMBULATORY_CARE_PROVIDER_SITE_OTHER): Payer: Medicare Other | Admitting: Gastroenterology

## 2021-10-31 DIAGNOSIS — D500 Iron deficiency anemia secondary to blood loss (chronic): Secondary | ICD-10-CM | POA: Diagnosis not present

## 2021-10-31 NOTE — Patient Instructions (Signed)
  The capsule endoscopy procedure will last approximately 8 hours. Contact your doctor's office immediately if you suffer from any abdominal pain, nausea or vomiting during the procedure. 1. You may drink colorless liquids starting 2 hours after swallowing the capsule.  2. You may have a light snack  4 hours after ingestion. After the examination is completed you may return to your normal diet.  3. Check the blue flashing DataRecorder light a couple of times through the day. If is stops blinking or changes color, note the time and contact your doctor.  4. Avoid strong electromagnetic fields such as MRI devices or ham radios after swallowing the capsule and until you pass it in a bowel movement.  5. Do not disconnect the equipment or completely remove the DataRecorder at any time during the procedure.  6. Treat the DataRecorder carefully. Avoid sudden movements and banging the DataRecorder.  7. Avoid direct exposure to bright sunlight.  8. Return to the office at 4:00 pm to have the recording equipment removed.  Clear Liquid Diet Examples: Black Coffee (non-dairy creamer ok) Jell-O (NO fruit added & NO red Jell-o) Water Bouillon (Chicken or Beef) 7-up Cranberry Juice Apple Juice Popsicles (NO red) Tea Coke Sprite Pepsi Ginger Ale Gatorade Mt Dew Dr Pepper Light Snack Examples: Soup Cereal 1/2 Sandwich Salad Eggs Potatoes Toast Rice  

## 2021-10-31 NOTE — Progress Notes (Signed)
CAPSULE ID: [id number] Exp: 2023-01-03  LOT: [id number]  Patient arrived for capsule endoscopy. Accompanied by family member. Reported the prep went well. Confirmed patient is fasting. Explained dietary restrictions for the next few hours. Patient verbalized understanding. Written instructions provided. Opened capsule, ensured capsule was flashing and transmitting to the recorder prior to the patient swallowing the capsule. Patient swallowed capsule without difficulty.  Patient told to call the office with any questions. Understands to return to the office today between 4:00 and 4:30 pm. No further questions by the conclusion of the visit.

## 2021-10-31 NOTE — Progress Notes (Signed)
Internal Medicine Clinic Attending  Case discussed with the resident at the time of the visit.  We reviewed the resident's history and exam and pertinent patient test results.  I agree with the assessment, diagnosis, and plan of care documented in the resident's note.  

## 2021-11-01 DIAGNOSIS — N183 Chronic kidney disease, stage 3 unspecified: Secondary | ICD-10-CM | POA: Diagnosis not present

## 2021-11-01 DIAGNOSIS — E1122 Type 2 diabetes mellitus with diabetic chronic kidney disease: Secondary | ICD-10-CM | POA: Diagnosis not present

## 2021-11-01 DIAGNOSIS — E1142 Type 2 diabetes mellitus with diabetic polyneuropathy: Secondary | ICD-10-CM | POA: Diagnosis not present

## 2021-11-01 DIAGNOSIS — F419 Anxiety disorder, unspecified: Secondary | ICD-10-CM | POA: Diagnosis not present

## 2021-11-01 DIAGNOSIS — I130 Hypertensive heart and chronic kidney disease with heart failure and stage 1 through stage 4 chronic kidney disease, or unspecified chronic kidney disease: Secondary | ICD-10-CM | POA: Diagnosis not present

## 2021-11-01 DIAGNOSIS — I5022 Chronic systolic (congestive) heart failure: Secondary | ICD-10-CM | POA: Diagnosis not present

## 2021-11-08 DIAGNOSIS — I5022 Chronic systolic (congestive) heart failure: Secondary | ICD-10-CM | POA: Diagnosis not present

## 2021-11-08 DIAGNOSIS — N183 Chronic kidney disease, stage 3 unspecified: Secondary | ICD-10-CM | POA: Diagnosis not present

## 2021-11-08 DIAGNOSIS — E1142 Type 2 diabetes mellitus with diabetic polyneuropathy: Secondary | ICD-10-CM | POA: Diagnosis not present

## 2021-11-08 DIAGNOSIS — I130 Hypertensive heart and chronic kidney disease with heart failure and stage 1 through stage 4 chronic kidney disease, or unspecified chronic kidney disease: Secondary | ICD-10-CM | POA: Diagnosis not present

## 2021-11-08 DIAGNOSIS — F419 Anxiety disorder, unspecified: Secondary | ICD-10-CM | POA: Diagnosis not present

## 2021-11-08 DIAGNOSIS — E1122 Type 2 diabetes mellitus with diabetic chronic kidney disease: Secondary | ICD-10-CM | POA: Diagnosis not present

## 2021-11-08 NOTE — Addendum Note (Signed)
Addended by: Riesa Pope on: 11/08/2021 11:23 AM   Modules accepted: Orders

## 2021-11-13 DIAGNOSIS — E1142 Type 2 diabetes mellitus with diabetic polyneuropathy: Secondary | ICD-10-CM | POA: Diagnosis not present

## 2021-11-13 DIAGNOSIS — N183 Chronic kidney disease, stage 3 unspecified: Secondary | ICD-10-CM | POA: Diagnosis not present

## 2021-11-13 DIAGNOSIS — I130 Hypertensive heart and chronic kidney disease with heart failure and stage 1 through stage 4 chronic kidney disease, or unspecified chronic kidney disease: Secondary | ICD-10-CM | POA: Diagnosis not present

## 2021-11-13 DIAGNOSIS — F419 Anxiety disorder, unspecified: Secondary | ICD-10-CM | POA: Diagnosis not present

## 2021-11-13 DIAGNOSIS — I5022 Chronic systolic (congestive) heart failure: Secondary | ICD-10-CM | POA: Diagnosis not present

## 2021-11-13 DIAGNOSIS — E1122 Type 2 diabetes mellitus with diabetic chronic kidney disease: Secondary | ICD-10-CM | POA: Diagnosis not present

## 2021-11-20 DIAGNOSIS — E1122 Type 2 diabetes mellitus with diabetic chronic kidney disease: Secondary | ICD-10-CM | POA: Diagnosis not present

## 2021-11-20 DIAGNOSIS — I5022 Chronic systolic (congestive) heart failure: Secondary | ICD-10-CM | POA: Diagnosis not present

## 2021-11-20 DIAGNOSIS — F419 Anxiety disorder, unspecified: Secondary | ICD-10-CM | POA: Diagnosis not present

## 2021-11-20 DIAGNOSIS — E1142 Type 2 diabetes mellitus with diabetic polyneuropathy: Secondary | ICD-10-CM | POA: Diagnosis not present

## 2021-11-20 DIAGNOSIS — I130 Hypertensive heart and chronic kidney disease with heart failure and stage 1 through stage 4 chronic kidney disease, or unspecified chronic kidney disease: Secondary | ICD-10-CM | POA: Diagnosis not present

## 2021-11-20 DIAGNOSIS — N183 Chronic kidney disease, stage 3 unspecified: Secondary | ICD-10-CM | POA: Diagnosis not present

## 2021-11-25 DIAGNOSIS — Z7901 Long term (current) use of anticoagulants: Secondary | ICD-10-CM | POA: Diagnosis not present

## 2021-11-25 DIAGNOSIS — E039 Hypothyroidism, unspecified: Secondary | ICD-10-CM | POA: Diagnosis not present

## 2021-11-25 DIAGNOSIS — I5022 Chronic systolic (congestive) heart failure: Secondary | ICD-10-CM | POA: Diagnosis not present

## 2021-11-25 DIAGNOSIS — E1122 Type 2 diabetes mellitus with diabetic chronic kidney disease: Secondary | ICD-10-CM | POA: Diagnosis not present

## 2021-11-25 DIAGNOSIS — K279 Peptic ulcer, site unspecified, unspecified as acute or chronic, without hemorrhage or perforation: Secondary | ICD-10-CM | POA: Diagnosis not present

## 2021-11-25 DIAGNOSIS — F419 Anxiety disorder, unspecified: Secondary | ICD-10-CM | POA: Diagnosis not present

## 2021-11-25 DIAGNOSIS — I4891 Unspecified atrial fibrillation: Secondary | ICD-10-CM | POA: Diagnosis not present

## 2021-11-25 DIAGNOSIS — N183 Chronic kidney disease, stage 3 unspecified: Secondary | ICD-10-CM | POA: Diagnosis not present

## 2021-11-25 DIAGNOSIS — M109 Gout, unspecified: Secondary | ICD-10-CM | POA: Diagnosis not present

## 2021-11-25 DIAGNOSIS — D696 Thrombocytopenia, unspecified: Secondary | ICD-10-CM | POA: Diagnosis not present

## 2021-11-25 DIAGNOSIS — Z7984 Long term (current) use of oral hypoglycemic drugs: Secondary | ICD-10-CM | POA: Diagnosis not present

## 2021-11-25 DIAGNOSIS — Z7982 Long term (current) use of aspirin: Secondary | ICD-10-CM | POA: Diagnosis not present

## 2021-11-25 DIAGNOSIS — G4700 Insomnia, unspecified: Secondary | ICD-10-CM | POA: Diagnosis not present

## 2021-11-25 DIAGNOSIS — I130 Hypertensive heart and chronic kidney disease with heart failure and stage 1 through stage 4 chronic kidney disease, or unspecified chronic kidney disease: Secondary | ICD-10-CM | POA: Diagnosis not present

## 2021-11-25 DIAGNOSIS — E1142 Type 2 diabetes mellitus with diabetic polyneuropathy: Secondary | ICD-10-CM | POA: Diagnosis not present

## 2021-11-25 DIAGNOSIS — Z9181 History of falling: Secondary | ICD-10-CM | POA: Diagnosis not present

## 2021-11-26 ENCOUNTER — Telehealth: Payer: Self-pay | Admitting: Gastroenterology

## 2021-11-26 ENCOUNTER — Telehealth: Payer: Self-pay | Admitting: Student

## 2021-11-26 DIAGNOSIS — Z7902 Long term (current) use of antithrombotics/antiplatelets: Secondary | ICD-10-CM

## 2021-11-26 DIAGNOSIS — D500 Iron deficiency anemia secondary to blood loss (chronic): Secondary | ICD-10-CM

## 2021-11-26 MED ORDER — LEVOTHYROXINE SODIUM 112 MCG PO TABS
112.0000 ug | ORAL_TABLET | Freq: Every day | ORAL | 0 refills | Status: DC
Start: 2021-11-26 — End: 2022-02-28

## 2021-11-26 NOTE — Telephone Encounter (Signed)
Refill Request- The patient's spouse has already contacted the pharmacy.  levothyroxine (SYNTHROID) 112 MCG tablet  [id number]- [place], [place] - [address] AT [address]

## 2021-11-26 NOTE — Telephone Encounter (Signed)
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  Not actively bleeding, not clear if this is contributing to the anemia. Small bowel looks normal without bleeding sources.  Since his blood counts were so much improved when last checked around the time of his visit with me in late June, my advice is to recheck a CBC and iron studies now.  If his blood counts are significantly decreasing, he may need a repeat upper endoscopy.  Also, please find out if he is currently taking any supplement.  If he is still taking pantoprazole, he can stop that medicine.  -HD

## 2021-11-27 NOTE — Telephone Encounter (Signed)
Pt wife is returning call from yesterday. Please reach out to advise.

## 2021-11-27 NOTE — Telephone Encounter (Signed)
Lm on vm for patient to return call. ° °Lab orders in epic. °

## 2021-11-27 NOTE — Addendum Note (Signed)
Addended by: Yevette Edwards on: 11/27/2021 02:31 PM   Modules accepted: Orders

## 2021-11-27 NOTE — Telephone Encounter (Signed)
Returned call to patient's wife. Spoke with patient and his wife, we reviewed VCE results and recommendations. Pt is not currently taking any iron supplements, and he has been advised to take discontinue Pantoprazole at this time. They have been advised that patient can stop by our lab at his convenience, no appt necessary. Recommendations will be based on the lab results. They are aware that if levels are declining then pt may need a repeat EGD. Pt and his wife verbalized understanding and had no concerns at the end of the call.

## 2021-11-28 DIAGNOSIS — I5022 Chronic systolic (congestive) heart failure: Secondary | ICD-10-CM | POA: Diagnosis not present

## 2021-11-28 DIAGNOSIS — E1122 Type 2 diabetes mellitus with diabetic chronic kidney disease: Secondary | ICD-10-CM | POA: Diagnosis not present

## 2021-11-28 DIAGNOSIS — F419 Anxiety disorder, unspecified: Secondary | ICD-10-CM | POA: Diagnosis not present

## 2021-11-28 DIAGNOSIS — N183 Chronic kidney disease, stage 3 unspecified: Secondary | ICD-10-CM | POA: Diagnosis not present

## 2021-11-28 DIAGNOSIS — I130 Hypertensive heart and chronic kidney disease with heart failure and stage 1 through stage 4 chronic kidney disease, or unspecified chronic kidney disease: Secondary | ICD-10-CM | POA: Diagnosis not present

## 2021-11-28 DIAGNOSIS — E1142 Type 2 diabetes mellitus with diabetic polyneuropathy: Secondary | ICD-10-CM | POA: Diagnosis not present

## 2021-11-29 ENCOUNTER — Other Ambulatory Visit: Payer: Self-pay

## 2021-11-29 ENCOUNTER — Other Ambulatory Visit (INDEPENDENT_AMBULATORY_CARE_PROVIDER_SITE_OTHER): Payer: Medicare Other

## 2021-11-29 DIAGNOSIS — Z7902 Long term (current) use of antithrombotics/antiplatelets: Secondary | ICD-10-CM

## 2021-11-29 DIAGNOSIS — D500 Iron deficiency anemia secondary to blood loss (chronic): Secondary | ICD-10-CM

## 2021-11-29 LAB — CBC WITH DIFFERENTIAL/PLATELET
Basophils Absolute: 0 10*3/uL (ref 0.0–0.1)
Basophils Relative: 1.4 % (ref 0.0–3.0)
Eosinophils Absolute: 0.1 10*3/uL (ref 0.0–0.7)
Eosinophils Relative: 2.2 % (ref 0.0–5.0)
HCT: 45.8 % (ref 39.0–52.0)
Hemoglobin: 14.5 g/dL (ref 13.0–17.0)
Lymphocytes Relative: 23.8 % (ref 12.0–46.0)
Lymphs Abs: 0.8 10*3/uL (ref 0.7–4.0)
MCHC: 31.7 g/dL (ref 30.0–36.0)
MCV: 85.9 fl (ref 78.0–100.0)
Monocytes Absolute: 0.3 10*3/uL (ref 0.1–1.0)
Monocytes Relative: 9.5 % (ref 3.0–12.0)
Neutro Abs: 2.1 10*3/uL (ref 1.4–7.7)
Neutrophils Relative %: 63.1 % (ref 43.0–77.0)
Platelets: 143 10*3/uL — ABNORMAL LOW (ref 150.0–400.0)
RBC: 5.34 Mil/uL (ref 4.22–5.81)
RDW: 16.2 % — ABNORMAL HIGH (ref 11.5–15.5)
WBC: 3.3 10*3/uL — ABNORMAL LOW (ref 4.0–10.5)

## 2021-11-29 LAB — IBC + FERRITIN
Ferritin: 23.7 ng/mL (ref 22.0–322.0)
Iron: 35 ug/dL — ABNORMAL LOW (ref 42–165)
Saturation Ratios: 7.7 % — ABNORMAL LOW (ref 20.0–50.0)
TIBC: 455 ug/dL — ABNORMAL HIGH (ref 250.0–450.0)
Transferrin: 325 mg/dL (ref 212.0–360.0)

## 2021-11-29 MED ORDER — IRON 325 (65 FE) MG PO TABS
1.0000 | ORAL_TABLET | ORAL | 0 refills | Status: DC
Start: 2021-11-29 — End: 2021-12-17

## 2021-12-04 ENCOUNTER — Ambulatory Visit (INDEPENDENT_AMBULATORY_CARE_PROVIDER_SITE_OTHER): Payer: Medicare Other | Admitting: Podiatry

## 2021-12-04 ENCOUNTER — Encounter: Payer: Self-pay | Admitting: Podiatry

## 2021-12-04 DIAGNOSIS — E1142 Type 2 diabetes mellitus with diabetic polyneuropathy: Secondary | ICD-10-CM

## 2021-12-04 DIAGNOSIS — S98131D Complete traumatic amputation of one right lesser toe, subsequent encounter: Secondary | ICD-10-CM

## 2021-12-04 DIAGNOSIS — I5022 Chronic systolic (congestive) heart failure: Secondary | ICD-10-CM | POA: Diagnosis not present

## 2021-12-04 DIAGNOSIS — B351 Tinea unguium: Secondary | ICD-10-CM | POA: Diagnosis not present

## 2021-12-04 DIAGNOSIS — M79675 Pain in left toe(s): Secondary | ICD-10-CM

## 2021-12-04 DIAGNOSIS — E1122 Type 2 diabetes mellitus with diabetic chronic kidney disease: Secondary | ICD-10-CM | POA: Diagnosis not present

## 2021-12-04 DIAGNOSIS — N183 Chronic kidney disease, stage 3 unspecified: Secondary | ICD-10-CM | POA: Diagnosis not present

## 2021-12-04 DIAGNOSIS — M79674 Pain in right toe(s): Secondary | ICD-10-CM

## 2021-12-04 DIAGNOSIS — I130 Hypertensive heart and chronic kidney disease with heart failure and stage 1 through stage 4 chronic kidney disease, or unspecified chronic kidney disease: Secondary | ICD-10-CM | POA: Diagnosis not present

## 2021-12-04 DIAGNOSIS — F419 Anxiety disorder, unspecified: Secondary | ICD-10-CM | POA: Diagnosis not present

## 2021-12-04 NOTE — Progress Notes (Signed)
This patient returns to my office for at risk foot care.  This patient requires this care by a professional since this patient will be at risk due to having PAD, DVT, DM and CKD.  He presents to the office with his wife.  This patient is unable to cut nails himself since the patient cannot reach his nails.These nails are painful walking and wearing shoes.  This patient presents for at risk foot care today.  General Appearance  Alert, conversant and in no acute stress.  Vascular  Dorsalis pedis and posterior tibial  pulses are palpable  bilaterally.  Capillary return is within normal limits  bilaterally. Temperature is within normal limits  bilaterally.  Neurologic  Senn-Weinstein monofilament wire test within normal limits  bilaterally. Muscle power within normal limits bilaterally.  Nails Thick disfigured discolored nails with subungual debris  from hallux to fifth toes left foot   Long thick nails 2-5 right foot.. No evidence of bacterial infection or drainage bilaterally.  Orthopedic  No limitations of motion  feet .  No crepitus or effusions noted.  No bony pathology or digital deformities noted. Amputation right hallux.  Skin  normotropic skin with no porokeratosis noted bilaterally.  No signs of infections or ulcers noted.     Onychomycosis  Pain in right toes  Pain in left toes  Consent was obtained for treatment procedures.   Mechanical debridement of nails 1-5  bilaterally performed with a nail nipper.  Filed with dremel without incident. 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   Return office visit  4 months.                   Told patient to return for periodic foot care and evaluation due to potential at risk complications.   Gardiner Barefoot DPM

## 2021-12-05 NOTE — Telephone Encounter (Signed)
Patient sent message via my chart to contact our office to schedule an appointment for the end of the month.

## 2021-12-09 DIAGNOSIS — N183 Chronic kidney disease, stage 3 unspecified: Secondary | ICD-10-CM | POA: Diagnosis not present

## 2021-12-09 DIAGNOSIS — I5022 Chronic systolic (congestive) heart failure: Secondary | ICD-10-CM | POA: Diagnosis not present

## 2021-12-09 DIAGNOSIS — E1122 Type 2 diabetes mellitus with diabetic chronic kidney disease: Secondary | ICD-10-CM | POA: Diagnosis not present

## 2021-12-09 DIAGNOSIS — E1142 Type 2 diabetes mellitus with diabetic polyneuropathy: Secondary | ICD-10-CM | POA: Diagnosis not present

## 2021-12-09 DIAGNOSIS — F419 Anxiety disorder, unspecified: Secondary | ICD-10-CM | POA: Diagnosis not present

## 2021-12-09 DIAGNOSIS — I130 Hypertensive heart and chronic kidney disease with heart failure and stage 1 through stage 4 chronic kidney disease, or unspecified chronic kidney disease: Secondary | ICD-10-CM | POA: Diagnosis not present

## 2021-12-16 NOTE — Progress Notes (Unsigned)
Office Visit    [name] Date of Encounter: 12/17/2021  Primary Care Provider:  Leigh Aurora, DO Primary Cardiologist:  Evalina Field, MD  Chief Complaint    71 year old male with a history of persistent atrial fibrillation, chronic combined systolic and diastolic heart failure, PAD s/p amputation of right great toe in 06/2019, hypertension, hyperlipidemia, and type 2 diabetes who presents for follow-up related to atrial fibrillation, heart failure, and hypertension.  Past Medical History    Past Medical History:  Diagnosis Date   Anemia due to blood loss, acute 2014   2nd GIB   Anxiety    CKD (chronic kidney disease), stage III ([place]) 2014   Diabetes mellitus without complication ([place])    Gout    High cholesterol    Hypertension 2014   Hypothyroid    Neuropathy    PUD (peptic ulcer disease) 2014   Past Surgical History:  Procedure Laterality Date   ABDOMINAL AORTOGRAM W/LOWER EXTREMITY Bilateral 06/27/2019   Procedure: ABDOMINAL AORTOGRAM W/LOWER EXTREMITY;  Surgeon: Elam Dutch, MD;  Location: [facility];  Service: Vascular;  Laterality: Bilateral;   ADRENALECTOMY     AMPUTATION Right 07/18/2019   Procedure: AMPUTATION RIGHT GREAT TOE;  Surgeon: [name], MD;  Location: [facility];  Service: Vascular;  Laterality: Right;   BIOPSY  09/09/2021   Procedure: BIOPSY;  Surgeon: Lavena Bullion, DO;  Location: [facility];  Service: Gastroenterology;;   COLONOSCOPY WITH PROPOFOL N/A 09/09/2021   Procedure: COLONOSCOPY WITH PROPOFOL;  Surgeon: Lavena Bullion, DO;  Location: [facility];  Service: Gastroenterology;  Laterality: N/A;   COLONOSCOPY WITH PROPOFOL N/A 09/20/2021   Procedure: COLONOSCOPY WITH PROPOFOL;  Surgeon: Mauri Pole, MD;  Location: [facility];  Service: Gastroenterology;  Laterality: N/A;   EMBOLECTOMY Right 06/27/2019   Procedure: RIGHT LEG EMBOLECTOMY;  Surgeon: Elam Dutch, MD;  Location: [facility];   Service: Vascular;  Laterality: Right;   ESOPHAGOGASTRODUODENOSCOPY (EGD) WITH PROPOFOL N/A 09/09/2021   Procedure: ESOPHAGOGASTRODUODENOSCOPY (EGD) WITH PROPOFOL;  Surgeon: Lavena Bullion, DO;  Location: [place];  Service: Gastroenterology;  Laterality: N/A;   HEMOSTASIS CLIP PLACEMENT  09/20/2021   Procedure: HEMOSTASIS CLIP PLACEMENT;  Surgeon: Mauri Pole, MD;  Location: [place];  Service: Gastroenterology;;   POLYPECTOMY  09/09/2021   Procedure: POLYPECTOMY;  Surgeon: Lavena Bullion, DO;  Location: [facility];  Service: Gastroenterology;;  EGD and COLON    Allergies  Allergies  Allergen Reactions   Morphine And Related Itching    History of Present Illness    71 year old male with the above past medical history including persistent atrial fibrillation, chronic combined systolic and diastolic heart failure, PAD s/p amputation of right great toe in 06/2019, hypertension, hyperlipidemia, and type 2 diabetes.  He was diagnosed with combined systolic and diastolic heart failure in February 2021 in the setting of sepsis (EF 35-40% at the time). Repeat echocardiogram in February 2022 showed EF improved to 50 to 55%, global hypokinesis, mild LVH, mild biatrial enlargement, no significant valvular abnormalities. He has a history of persistent atrial fibrillation, rate controlled, on Xarelto.  Additionally, he has a history of PAD s/p amputation of right great toes.  He was last seen in the office on 11/01/2020 and was stable from a cardiac standpoint.  He reported stable chronic dyspnea on exertion and additional symptoms concerning for angina.  He was hospitalized in May 2023 with acute GI bleed.  Xarelto was temporarily held and  later resumed.  He presents today for follow-up accompanied by his wife.  Since his last visit he has done well from a cardiac standpoint.  He denies any dyspnea, chest pain, palpitations, presyncope, syncope.  He denies any bleeding on Xarelto.   Overall, he reports feeling well and denies any new concerns today.  Home Medications    Current Outpatient Medications  Medication Sig Dispense Refill   albuterol (VENTOLIN HFA) 108 (90 Base) MCG/ACT inhaler Inhale 2 puffs into the lungs every 6 (six) hours as needed for wheezing or shortness of breath. 8 g 2   aspirin EC 81 MG EC tablet Take 1 tablet (81 mg total) by mouth daily.     atorvastatin (LIPITOR) 80 MG tablet Take 1 tablet (80 mg total) by mouth at bedtime. 90 tablet 3   dextromethorphan-guaiFENesin (MUCINEX DM) 30-600 MG 12hr tablet Take 1 tablet by mouth 2 (two) times daily. 30 tablet 0   fenofibrate 54 MG tablet TAKE 1 TABLET(54 MG) BY MOUTH DAILY (Patient taking differently: Take 54 mg by mouth daily.) 90 tablet 1   fluticasone (FLONASE) 50 MCG/ACT nasal spray Place 1 spray into both nostrils daily. 11.1 mL 1   JARDIANCE 10 MG TABS tablet TAKE 1 TABLET BY MOUTH EVERY DAY (Patient taking differently: Take 10 mg by mouth daily.) 90 tablet 1   levothyroxine (SYNTHROID) 112 MCG tablet Take 1 tablet (112 mcg total) by mouth at bedtime. 90 tablet 0   Magnesium 250 MG TABS Take 1 tablet (250 mg total) by mouth daily. 30 tablet 0   Melatonin 10 MG TABS Take 10 mg by mouth at bedtime.     metoprolol succinate (TOPROL-XL) 25 MG 24 hr tablet Take 3 tablets (75 mg total) by mouth daily. 90 tablet 2   pregabalin (LYRICA) 75 MG capsule Take 2 capsules (150 mg total) by mouth at bedtime. (Patient taking differently: Take 75 mg by mouth at bedtime.) 180 capsule 0   sertraline (ZOLOFT) 100 MG tablet TAKE 1 TABLET BY MOUTH EVERY DAY (Patient taking differently: Take 100 mg by mouth daily.) 90 tablet 4   tamsulosin (FLOMAX) 0.4 MG CAPS capsule Take 0.4 mg by mouth daily after supper.     traZODone (DESYREL) 100 MG tablet Take 1 tablet (100 mg total) by mouth at bedtime. 90 tablet 1   XARELTO 20 MG TABS tablet TAKE 1 TABLET BY MOUTH EVERY DAY (Patient taking differently: Take 20 mg by mouth daily.)  30 tablet 5   No current facility-administered medications for this visit.     Review of Systems   He denies chest pain, palpitations, dyspnea, pnd, orthopnea, n, v, dizziness, syncope, edema, weight gain, or early satiety. All other systems reviewed and are otherwise negative except as noted above.   Physical Exam    VS:  BP 138/80   Pulse 94   Ht 6' (1.829 m)   Wt 224 lb (101.6 kg)   SpO2 94%   BMI 30.38 kg/m   GEN: Well nourished, well developed, in no acute distress. HEENT: normal. Neck: Supple, no JVD, carotid bruits, or masses. Cardiac: IRIR, no murmurs, rubs, or gallops. No clubbing, cyanosis, edema.  Radials/DP/PT 2+ and equal bilaterally.  Respiratory:  Respirations regular and unlabored, clear to auscultation bilaterally. GI: Soft, nontender, nondistended, BS + x 4. MS: no deformity or atrophy. Skin: warm and dry, no rash. Neuro:  Strength and sensation are intact. Psych: Normal affect.  Accessory Clinical Findings    ECG personally reviewed by me  today - atrial fibrillation, 94 bpm- no acute changes.   Lab Results  Component Value Date   WBC 3.3 (L) 11/29/2021   HGB 14.5 11/29/2021   HCT 45.8 11/29/2021   MCV 85.9 11/29/2021   PLT 143.0 (L) 11/29/2021   Lab Results  Component Value Date   CREATININE 1.21 09/21/2021   BUN 7 (L) 09/21/2021   NA 139 09/21/2021   K 3.5 09/21/2021   CL 110 09/21/2021   CO2 22 09/21/2021   Lab Results  Component Value Date   ALT 16 09/19/2021   AST 25 09/19/2021   ALKPHOS 34 (L) 09/19/2021   BILITOT 1.3 (H) 09/19/2021   Lab Results  Component Value Date   CHOL 147 09/19/2020   HDL 38 (L) 09/19/2020   LDLCALC 87 09/19/2020   TRIG 121 09/19/2020   CHOLHDL 3.9 09/19/2020    Lab Results  Component Value Date   HGBA1C 6.5 (A) 09/05/2021    Assessment & Plan    1. Chronic combined systolic and diastolic heart failure: February 2021 in the setting of sepsis (EF 35-40% at the time).  Repeat echocardiogram in  February 2022 showed EF improved , no significant valvular abnormalities. Euvolemic and well compensated on exam.  Losartan previously discontinued in the setting of hypotension.  Continue to monitor BP, and if elevated, consider reintroduction of losartan.  Continue metoprolol, Jardiance.   2. Persistent atrial fibrillation: Rate controlled, continue metoprolol, Xarelto.  3. Hypertension: BP well controlled. Continue current antihypertensive regimen.   4. Hyperlipidemia: LDL was 87 in May 2022.  Monitored and managed per PCP.  Continue aspirin, Lipitor, fenofibrate.  5. PAD: S/p amputation of R great toe.  Continue statin.  6. Type 2 diabetes: A1c was 6.5 in May 2023.  Monitor managed per PCP.  Continue Jardiance.  7. Disposition: Follow-up in 1 year.     Lenna Sciara, NP 12/17/2021, 10:10 AM

## 2021-12-17 ENCOUNTER — Encounter: Payer: Self-pay | Admitting: Nurse Practitioner

## 2021-12-17 ENCOUNTER — Other Ambulatory Visit: Payer: Self-pay | Admitting: Student

## 2021-12-17 ENCOUNTER — Ambulatory Visit (INDEPENDENT_AMBULATORY_CARE_PROVIDER_SITE_OTHER): Payer: Medicare Other | Admitting: Nurse Practitioner

## 2021-12-17 VITALS — BP 138/80 | HR 94 | Ht 72.0 in | Wt 224.0 lb

## 2021-12-17 DIAGNOSIS — I10 Essential (primary) hypertension: Secondary | ICD-10-CM | POA: Diagnosis not present

## 2021-12-17 DIAGNOSIS — E119 Type 2 diabetes mellitus without complications: Secondary | ICD-10-CM

## 2021-12-17 DIAGNOSIS — E782 Mixed hyperlipidemia: Secondary | ICD-10-CM

## 2021-12-17 DIAGNOSIS — I5042 Chronic combined systolic (congestive) and diastolic (congestive) heart failure: Secondary | ICD-10-CM

## 2021-12-17 DIAGNOSIS — I739 Peripheral vascular disease, unspecified: Secondary | ICD-10-CM

## 2021-12-17 DIAGNOSIS — I4819 Other persistent atrial fibrillation: Secondary | ICD-10-CM

## 2021-12-17 NOTE — Patient Instructions (Signed)
Medication Instructions:  Your physician recommends that you continue on your current medications as directed. Please refer to the Current Medication list given to you today.   *If you need a refill on your cardiac medications before your next appointment, please call your pharmacy*   Lab Work: NONE ordered at this time of appointment   If you have labs (blood work) drawn today and your tests are completely normal, you will receive your results only by: [place] (if you have MyChart) OR A paper copy in the mail If you have any lab test that is abnormal or we need to change your treatment, we will call you to review the results.   Testing/Procedures: NONE ordered at this time of appointment    Follow-Up: At [facility], you and your health needs are our priority.  As part of our continuing mission to provide you with exceptional heart care, we have created designated Provider Care Teams.  These Care Teams include your primary Cardiologist (physician) and Advanced Practice Providers (APPs -  Physician Assistants and Nurse Practitioners) who all work together to provide you with the care you need, when you need it.  We recommend signing up for the patient portal called "MyChart".  Sign up information is provided on this After Visit Summary.  MyChart is used to connect with patients for Virtual Visits (Telemedicine).  Patients are able to view lab/test results, encounter notes, upcoming appointments, etc.  Non-urgent messages can be sent to your provider as well.   To learn more about what you can do with MyChart, go to [email or web address].    Your next appointment:   1 year(s)  The format for your next appointment:   In Person  Provider:   Evalina Field, MD     Other Instructions   Important Information About Sugar

## 2022-01-17 ENCOUNTER — Telehealth: Payer: Self-pay

## 2022-01-17 NOTE — Telephone Encounter (Signed)
Spoke with patient's wife to remind her that patient is due for repeat labs (CBC and IBC + Ferritin) at this time. No appointment is necessary. Patient's wife is aware that pt can stop by the lab in the basement at his convenience between 7:30 AM - 5 PM, Monday through Friday. Patient's wife verbalized understanding and had no concerns at the end of the call.

## 2022-01-17 NOTE — Telephone Encounter (Signed)
-----   Message from Yevette Edwards, RN sent at 11/29/2021  4:40 PM EDT ----- Regarding: Labs CBC, IBC + Ferritin - orders are in epic

## 2022-01-20 ENCOUNTER — Other Ambulatory Visit (INDEPENDENT_AMBULATORY_CARE_PROVIDER_SITE_OTHER): Payer: Medicare Other

## 2022-01-20 ENCOUNTER — Ambulatory Visit (INDEPENDENT_AMBULATORY_CARE_PROVIDER_SITE_OTHER): Payer: Medicare Other | Admitting: Family Medicine

## 2022-01-20 ENCOUNTER — Encounter: Payer: Self-pay | Admitting: Family Medicine

## 2022-01-20 ENCOUNTER — Other Ambulatory Visit: Payer: Self-pay

## 2022-01-20 ENCOUNTER — Ambulatory Visit: Payer: Medicare Other

## 2022-01-20 VITALS — BP 108/69 | HR 68 | Temp 97.5°F | Ht 72.0 in | Wt 234.6 lb

## 2022-01-20 DIAGNOSIS — M25562 Pain in left knee: Secondary | ICD-10-CM

## 2022-01-20 DIAGNOSIS — H1013 Acute atopic conjunctivitis, bilateral: Secondary | ICD-10-CM

## 2022-01-20 DIAGNOSIS — Z23 Encounter for immunization: Secondary | ICD-10-CM | POA: Diagnosis not present

## 2022-01-20 DIAGNOSIS — D509 Iron deficiency anemia, unspecified: Secondary | ICD-10-CM | POA: Diagnosis not present

## 2022-01-20 DIAGNOSIS — D500 Iron deficiency anemia secondary to blood loss (chronic): Secondary | ICD-10-CM

## 2022-01-20 DIAGNOSIS — M25512 Pain in left shoulder: Secondary | ICD-10-CM

## 2022-01-20 DIAGNOSIS — M25569 Pain in unspecified knee: Secondary | ICD-10-CM | POA: Insufficient documentation

## 2022-01-20 DIAGNOSIS — M25519 Pain in unspecified shoulder: Secondary | ICD-10-CM | POA: Insufficient documentation

## 2022-01-20 DIAGNOSIS — M25511 Pain in right shoulder: Secondary | ICD-10-CM

## 2022-01-20 DIAGNOSIS — M25561 Pain in right knee: Secondary | ICD-10-CM | POA: Diagnosis not present

## 2022-01-20 DIAGNOSIS — Z7902 Long term (current) use of antithrombotics/antiplatelets: Secondary | ICD-10-CM

## 2022-01-20 DIAGNOSIS — H1010 Acute atopic conjunctivitis, unspecified eye: Secondary | ICD-10-CM | POA: Insufficient documentation

## 2022-01-20 LAB — CBC WITH DIFFERENTIAL/PLATELET
Basophils Absolute: 0 10*3/uL (ref 0.0–0.1)
Basophils Relative: 0.9 % (ref 0.0–3.0)
Eosinophils Absolute: 0.1 10*3/uL (ref 0.0–0.7)
Eosinophils Relative: 2.2 % (ref 0.0–5.0)
HCT: 43.8 % (ref 39.0–52.0)
Hemoglobin: 14.1 g/dL (ref 13.0–17.0)
Lymphocytes Relative: 18 % (ref 12.0–46.0)
Lymphs Abs: 0.8 10*3/uL (ref 0.7–4.0)
MCHC: 32.1 g/dL (ref 30.0–36.0)
MCV: 81.7 fl (ref 78.0–100.0)
Monocytes Absolute: 0.5 10*3/uL (ref 0.1–1.0)
Monocytes Relative: 10.4 % (ref 3.0–12.0)
Neutro Abs: 3.2 10*3/uL (ref 1.4–7.7)
Neutrophils Relative %: 68.5 % (ref 43.0–77.0)
Platelets: 142 10*3/uL — ABNORMAL LOW (ref 150.0–400.0)
RBC: 5.36 Mil/uL (ref 4.22–5.81)
RDW: 17.8 % — ABNORMAL HIGH (ref 11.5–15.5)
WBC: 4.6 10*3/uL (ref 4.0–10.5)

## 2022-01-20 LAB — IBC + FERRITIN
Ferritin: 13 ng/mL — ABNORMAL LOW (ref 22.0–322.0)
Iron: 44 ug/dL (ref 42–165)
Saturation Ratios: 10.2 % — ABNORMAL LOW (ref 20.0–50.0)
TIBC: 432.6 ug/dL (ref 250.0–450.0)
Transferrin: 309 mg/dL (ref 212.0–360.0)

## 2022-01-20 MED ORDER — OLOPATADINE HCL 0.1 % OP SOLN: 1.0000 [drp] | Freq: Two times a day (BID) | OPHTHALMIC | 0 refills | Status: AC

## 2022-01-20 NOTE — Assessment & Plan Note (Signed)
Patient denies shortness of breath, increasing fatigue, chest pain. Patient is scheduled for lab work for evaluation for his anemia. Patient have not started supplement iron.  Patient will check with gastroenterologist office for further recommendation on starting iron supplement tablets.

## 2022-01-20 NOTE — Assessment & Plan Note (Signed)
Tearing of the eyes is highly likely allergic in origin or from dry eye syndrome. Patient will start with Systane over-the-counter eyedrops.  If symptoms do not improve the prescription for Pataday was included which patient will start as directed. If symptoms continue to persist patient will follow-up with ophthalmologist for further recommendations.

## 2022-01-20 NOTE — Patient Instructions (Addendum)
Take extra strength Tylenol 500 mg 2 tablets up to 3 times a day as needed for shoulder and knee pain. Bending and stretching exercises of upper arms, neck and knees as tolerated May use heating pad to help with the pain. Referral for physical therapy was placed.  Someone from physical therapy will contact you for the appointment. Start Systane eyedrops for suspected dry eyes.  If symptoms do not improve then you may start prescription Pataday eyedrops. You have received flu vaccine today. Return to clinic in 3 months for reevaluation.

## 2022-01-20 NOTE — Assessment & Plan Note (Addendum)
After the physical examination I do not suspect acute joint infection, gout/pseudogout, rheumatoid arthritis. Knee joint pain is consistent with arthritic changes. Since patient have not tried anything to help with the joint pain patient was advised to start extra strength Tylenol 500 mg 2 tablets up to 3 times a day as needed for pain.  Referral to physical therapy was placed for improvement in restoration of the joint mobility. Patient will return to clinic in 3 months for reevaluation.

## 2022-01-20 NOTE — Assessment & Plan Note (Signed)
Pain in the shoulder joints is highly likely secondary to arthritic changes. Order for physical therapy was placed in the chart to help improve range of motion. Patient will take extra strength Tylenol to help with the pain.  Dosage and direction to take Tylenol were given to the patient. Patient will return to clinic in 3 months for reevaluation.

## 2022-01-20 NOTE — Progress Notes (Signed)
CC: Bilateral shoulder and knee pain.         Tearing of both eyes crusting in the morning  HPI: 71 year old male with history of CHF, A-fib on Xarelto, hypertension, hyperlipidemia, type 2 diabetes mellitus, peripheral artery disease presented to clinic with complaints of bilateral shoulder, knee pain and neck.  He is also complaining of tearing in both eyes.   Mr.Selmer Collingsworth is a 71 y.o.  male presented to clinic with CC of BL knee, shoulder and neck pain for approximately 6 months. He endorses knee pain, worse with walking and weightbearing. He denies injury or trauma.  He denies redness, swelling or warmth over knee joints.  Pt also complaining of bilateral shoulder pain which is worse when trying to bring his arms above the head.  He is denying redness or swelling in the shoulder joints.  Patient also complains of neck pain and decreased range of motion in the neck.  Pt denies radiation of the neck pain down his arms.  He is denying tingling or numbness in the arms.  Denies fever, chills, myalgia, headache, skin rash or wt changes. He denies known tick bite.   Patient also complaining of bilateral tearing of both eyes for about 1 year.  Patient endorses the drainage from eyes is watery and notice crusting in both eyes predominantly in the morning.  He denies itching of both eyes with some burning sensation.  Denies redness or purulent drainage from the eyes.  Patient denies visual changes or blurry vision.  He denies use of contact lenses.  Denies pets at home.  Patient does suffer from seasonal allergies.  Patient have tried Biofreeze for his shoulder and knee pain with short-term relief in symptoms.  Pt requested to get Flu vaccine.   Sedentary life style. Use cane to walk. Wife present in the room.  Past Medical History:  Diagnosis Date   Anemia due to blood loss, acute 2014   2nd GIB   Anxiety    CKD (chronic kidney disease), stage III ([place]) 2014   Diabetes mellitus without  complication ([place])    Gout    High cholesterol    Hypertension 2014   Hypothyroid    Neuropathy    PUD (peptic ulcer disease) 2014   Review of Systems: Review of Systems  Constitutional: Negative.   Eyes:  Positive for discharge (clear watery discharge. No photophobia, or blurry vision. Denies contact lens use. Denies injury.). Negative for blurred vision, photophobia, pain and redness.  Musculoskeletal:  Positive for joint pain and neck pain.  Skin:  Negative for rash.  Neurological:  Negative for headaches.       Physical Exam:Physical Exam Vitals and nursing note reviewed.  Constitutional:      Appearance: Normal appearance.  Eyes:     Conjunctiva/sclera: Conjunctivae normal.     Pupils: Pupils are equal, round, and reactive to light.     Comments: Trace clear watery discharge noted in both eyes. RT> Lt No conjunctival injection or crusting of the eyelids appreciated.  No eyelid swelling or erythema   Cardiovascular:     Rate and Rhythm: Normal rate and regular rhythm.  Musculoskeletal:        General: Tenderness (Tenderness to bilateral knee joint only with passive flexion > 60 degrees.Limited range of motion predominantly with flexion of both knees. No joint line tenderness.No knee joint laxity. Valgus and varus stress test negative bilaterally.) present. No swelling, deformity or signs of injury.     Cervical back:  Tenderness (Decrease ROM with lateral movement and extension.) present.     Comments: No knee joint effusion.  No overlying redness. Skin cool to touch.  Crepitus noted in both knees.  Neurovascular intact. Decreased range of motion in bilateral shoulder joints predominantly with abduction. Mild tenderness to glenohumeral joints BL and over right deltoid area with shoulder joint manipulations.  No C spine TTP. Decrease ROM with lateral movement of the neck, worse with right lateral movement. No trapezius muscle TTP or spasm noted. No weakness of the extremities  appreciated.   Skin:    General: Skin is dry.  Neurological:     Mental Status: He is alert. Mental status is at baseline.  Psychiatric:        Mood and Affect: Mood normal.      Vitals:   01/20/22 1312  BP: 108/69  Pulse: 68  Temp: (!) 97.5 F (36.4 C)  TempSrc: Oral  SpO2: 96%  Weight: 234 lb 9.6 oz (106.4 kg)  Height: 6' (1.829 m)   Flu vaccine was given to the patient.  Assessment & Plan:   See Encounters Tab for problem based charting.  Patient seen with Dr. Jimmye Norman

## 2022-01-22 ENCOUNTER — Other Ambulatory Visit: Payer: Self-pay

## 2022-01-22 DIAGNOSIS — D500 Iron deficiency anemia secondary to blood loss (chronic): Secondary | ICD-10-CM

## 2022-01-22 MED ORDER — FERROUS SULFATE 325 (65 FE) MG PO TABS
325.0000 mg | ORAL_TABLET | Freq: Every day | ORAL | 3 refills | Status: DC
Start: 2022-01-22 — End: 2022-04-14

## 2022-01-27 DIAGNOSIS — M25512 Pain in left shoulder: Secondary | ICD-10-CM | POA: Diagnosis not present

## 2022-01-27 DIAGNOSIS — M25562 Pain in left knee: Secondary | ICD-10-CM | POA: Diagnosis not present

## 2022-01-27 DIAGNOSIS — M25561 Pain in right knee: Secondary | ICD-10-CM | POA: Diagnosis not present

## 2022-01-27 DIAGNOSIS — M25511 Pain in right shoulder: Secondary | ICD-10-CM | POA: Diagnosis not present

## 2022-01-28 NOTE — Progress Notes (Signed)
Internal Medicine Clinic Attending  I saw and evaluated the patient.  I personally confirmed the key portions of the history and exam documented by Dr. Multani and I reviewed pertinent patient test results.  The assessment, diagnosis, and plan were formulated together and I agree with the documentation in the resident's note.  

## 2022-01-29 DIAGNOSIS — M25511 Pain in right shoulder: Secondary | ICD-10-CM | POA: Diagnosis not present

## 2022-01-29 DIAGNOSIS — M25561 Pain in right knee: Secondary | ICD-10-CM | POA: Diagnosis not present

## 2022-01-29 DIAGNOSIS — M25512 Pain in left shoulder: Secondary | ICD-10-CM | POA: Diagnosis not present

## 2022-01-29 DIAGNOSIS — M25562 Pain in left knee: Secondary | ICD-10-CM | POA: Diagnosis not present

## 2022-02-03 DIAGNOSIS — M25512 Pain in left shoulder: Secondary | ICD-10-CM | POA: Diagnosis not present

## 2022-02-03 DIAGNOSIS — M25561 Pain in right knee: Secondary | ICD-10-CM | POA: Diagnosis not present

## 2022-02-03 DIAGNOSIS — M25511 Pain in right shoulder: Secondary | ICD-10-CM | POA: Diagnosis not present

## 2022-02-03 DIAGNOSIS — M25562 Pain in left knee: Secondary | ICD-10-CM | POA: Diagnosis not present

## 2022-02-05 DIAGNOSIS — M25511 Pain in right shoulder: Secondary | ICD-10-CM | POA: Diagnosis not present

## 2022-02-05 DIAGNOSIS — M25561 Pain in right knee: Secondary | ICD-10-CM | POA: Diagnosis not present

## 2022-02-05 DIAGNOSIS — M25512 Pain in left shoulder: Secondary | ICD-10-CM | POA: Diagnosis not present

## 2022-02-05 DIAGNOSIS — M25562 Pain in left knee: Secondary | ICD-10-CM | POA: Diagnosis not present

## 2022-02-09 IMAGING — MR MR FOOT*R* W/O CM
5 series · 40 of 40 positions shown · non-contrast
Comparison: Radiographs 06/08/2019

CLINICAL DATA: Pain and swelling. Diabetic foot ulcers.

EXAM:
MRI OF THE RIGHT FOREFOOT WITHOUT CONTRAST
TECHNIQUE: Multiplanar, multisequence MR imaging of the right foot was
performed. No intravenous contrast was administered.

[Series 4: T1 · coronal · right · 3.0mm · 0.47mm/px · 10 of 44 slices shown (1 of 2)]
[im 1/44]
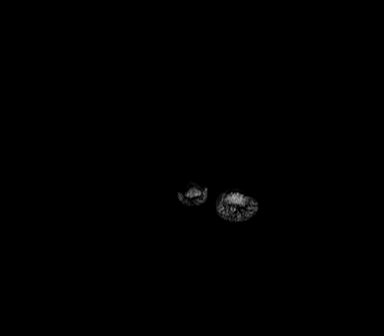
[im 5/44]
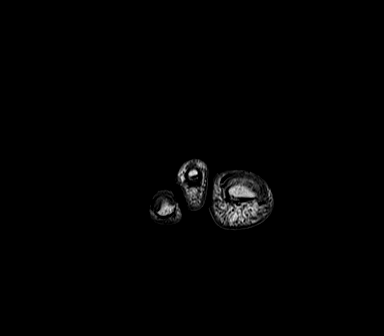
[im 10/44]
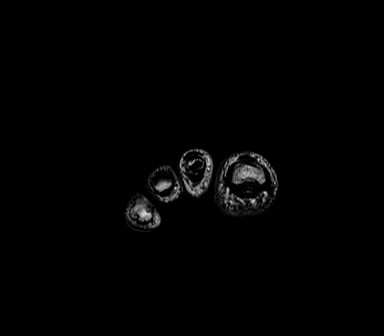
[im 15/44]
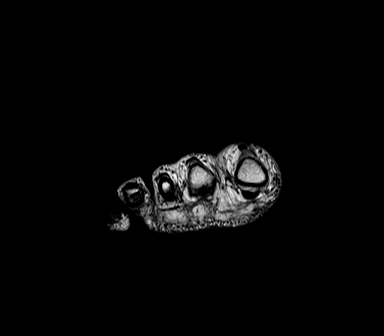
[im 20/44]
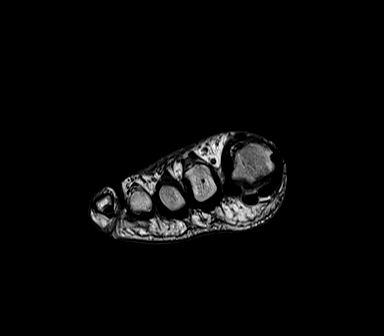
[im 24/44]
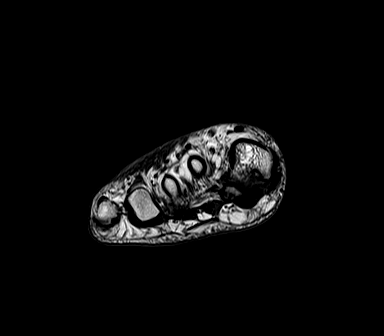
[im 29/44]
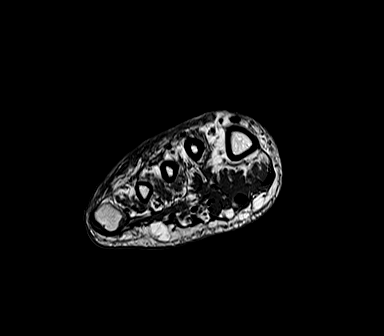
[im 34/44]
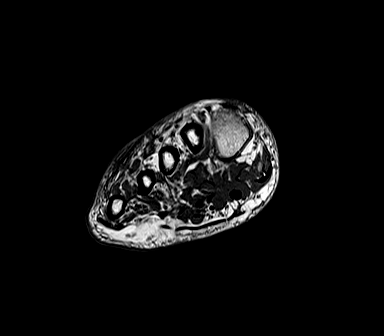
[im 39/44]
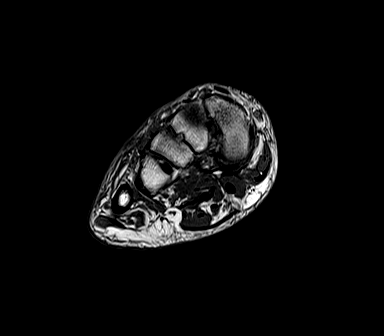
[im 44/44]
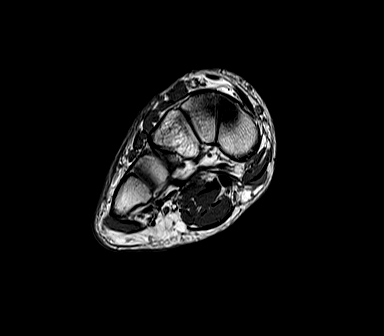

[Series 5: T2 fat-sat · coronal · right · 3.0mm · 0.49mm/px · 11 of 44 slices shown (1 of 2)]
[im 1/44]
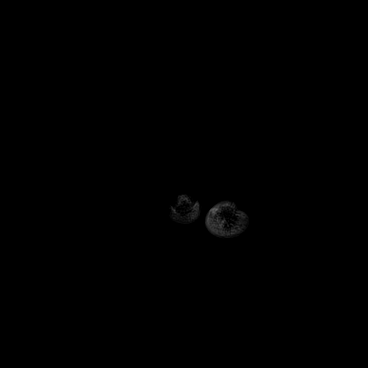
[im 5/44]
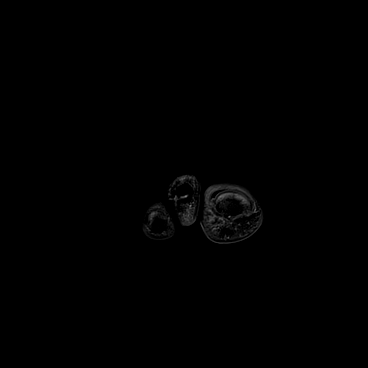
[im 9/44]
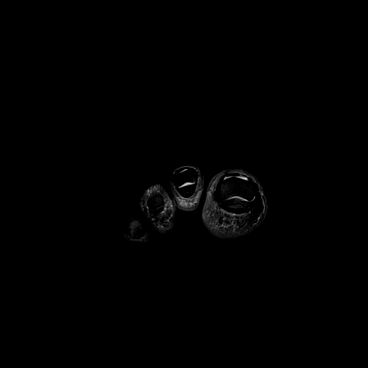
[im 13/44]
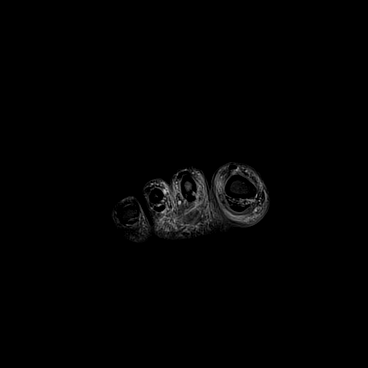
[im 18/44]
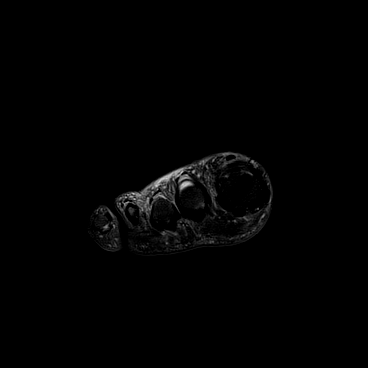
[im 22/44]
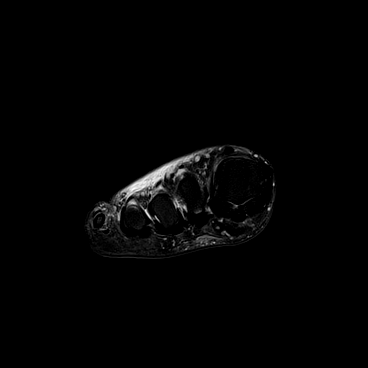
[im 26/44]
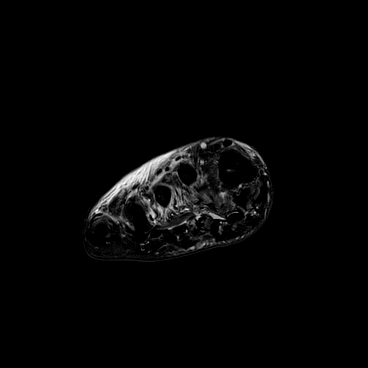
[im 31/44]
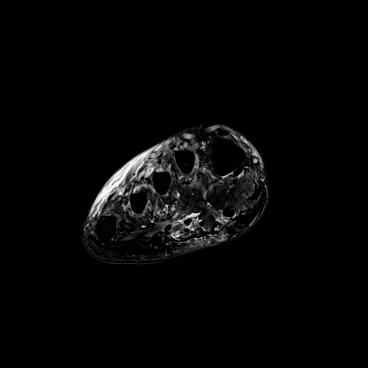
[im 35/44]
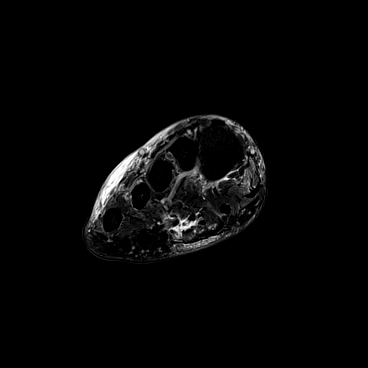
[im 39/44]
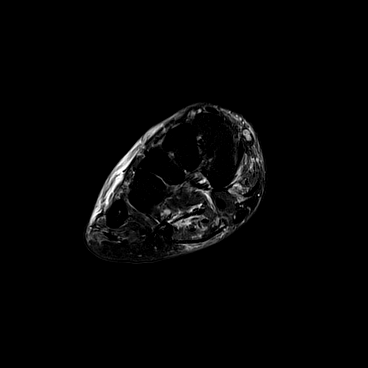
[im 44/44]
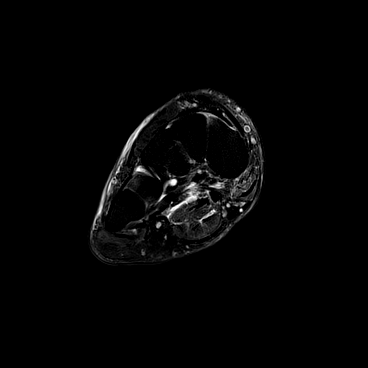

[Series 6: T1 · axial · right · 3.0mm · 0.52mm/px · z∈[-84,-4]mm · 6 of 22 slices shown (2 of 2)]
[im 1/22]
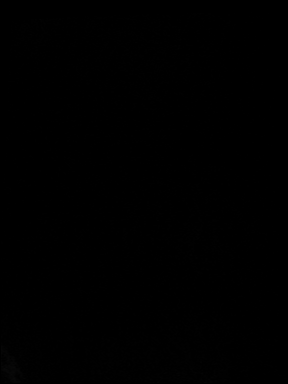
[im 5/22]
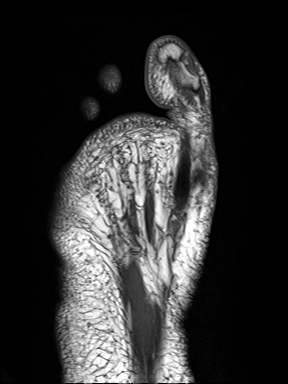
[im 9/22]
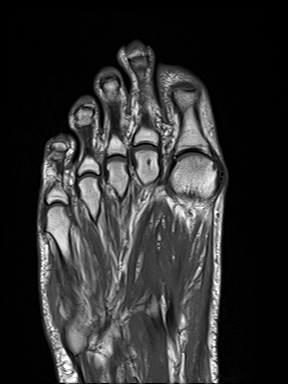
[im 13/22]
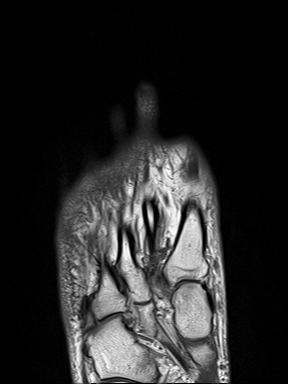
[im 17/22]
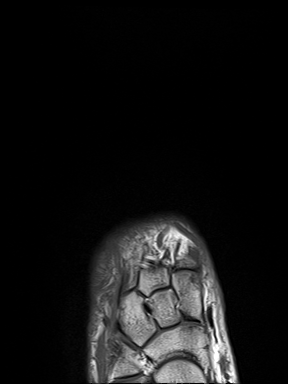
[im 22/22]
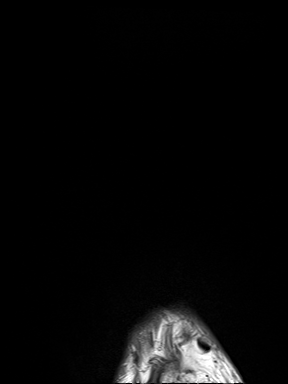

[Series 7: T2 fat-sat · axial · right · 3.0mm · 0.54mm/px · z∈[-84,-4]mm · 6 of 22 slices shown (2 of 2)]
[im 1/22]
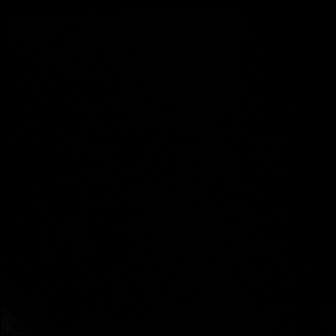
[im 5/22]
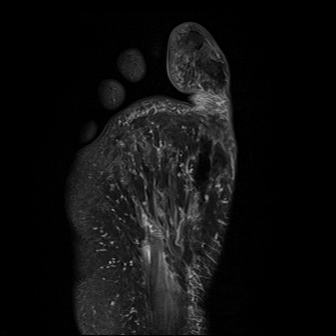
[im 9/22]
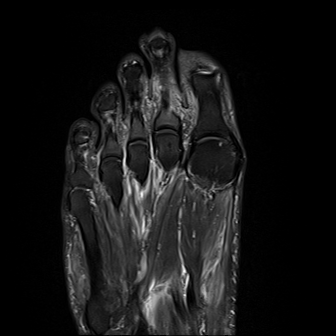
[im 13/22]
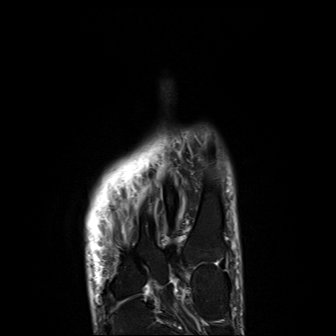
[im 17/22]
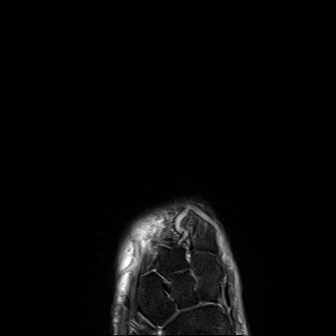
[im 22/22]
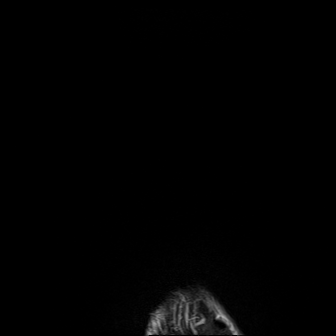

[Series 8: STIR · sagittal · right · 3.0mm · 0.70mm/px · 7 of 28 slices shown]
[im 1/28]
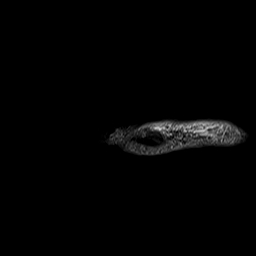
[im 5/28]
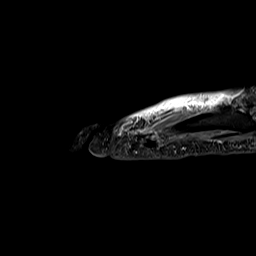
[im 10/28]
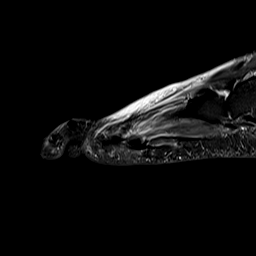
[im 14/28]
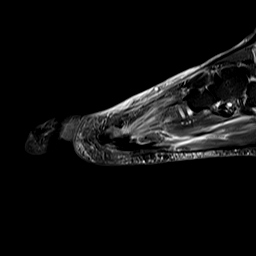
[im 19/28]
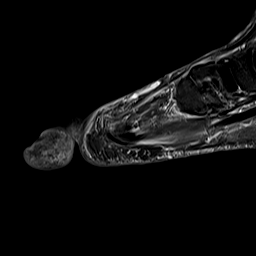
[im 23/28]
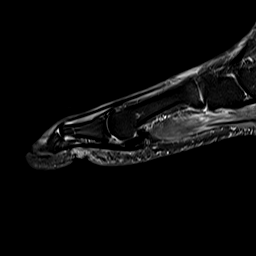
[im 28/28]
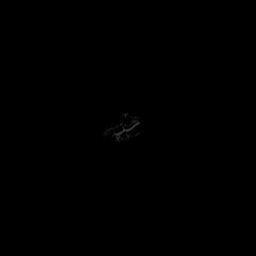

[40 of 40 positions shown; findings below may reference images not displayed]

FINDINGS: Moderate dorsal subcutaneous soft tissue swelling/edema/fluid
suggesting cellulitis. No discrete fluid collection to suggest a
soft tissue abscess. Mild to moderate myositis involving the plantar
foot musculature without definite findings for pyomyositis.

No MR findings suspicious for septic arthritis or osteomyelitis. The
major tendons and ligaments appear intact. No findings for septic
tenosynovitis.
IMPRESSION: 1. Cellulitis and myositis but no discrete soft tissue abscess or
pyomyositis.
2. No MR findings for septic arthritis or osteomyelitis.

## 2022-02-17 DIAGNOSIS — M25512 Pain in left shoulder: Secondary | ICD-10-CM | POA: Diagnosis not present

## 2022-02-17 DIAGNOSIS — M25562 Pain in left knee: Secondary | ICD-10-CM | POA: Diagnosis not present

## 2022-02-17 DIAGNOSIS — M25511 Pain in right shoulder: Secondary | ICD-10-CM | POA: Diagnosis not present

## 2022-02-17 DIAGNOSIS — M25561 Pain in right knee: Secondary | ICD-10-CM | POA: Diagnosis not present

## 2022-02-19 DIAGNOSIS — M25562 Pain in left knee: Secondary | ICD-10-CM | POA: Diagnosis not present

## 2022-02-19 DIAGNOSIS — M25512 Pain in left shoulder: Secondary | ICD-10-CM | POA: Diagnosis not present

## 2022-02-19 DIAGNOSIS — M25561 Pain in right knee: Secondary | ICD-10-CM | POA: Diagnosis not present

## 2022-02-19 DIAGNOSIS — M25511 Pain in right shoulder: Secondary | ICD-10-CM | POA: Diagnosis not present

## 2022-02-22 ENCOUNTER — Other Ambulatory Visit: Payer: Self-pay | Admitting: Internal Medicine

## 2022-02-22 DIAGNOSIS — R3915 Urgency of urination: Secondary | ICD-10-CM

## 2022-02-24 DIAGNOSIS — M25511 Pain in right shoulder: Secondary | ICD-10-CM | POA: Diagnosis not present

## 2022-02-24 DIAGNOSIS — M25562 Pain in left knee: Secondary | ICD-10-CM | POA: Diagnosis not present

## 2022-02-24 DIAGNOSIS — M25561 Pain in right knee: Secondary | ICD-10-CM | POA: Diagnosis not present

## 2022-02-24 DIAGNOSIS — M25512 Pain in left shoulder: Secondary | ICD-10-CM | POA: Diagnosis not present

## 2022-02-26 DIAGNOSIS — M25561 Pain in right knee: Secondary | ICD-10-CM | POA: Diagnosis not present

## 2022-02-26 DIAGNOSIS — M25512 Pain in left shoulder: Secondary | ICD-10-CM | POA: Diagnosis not present

## 2022-02-26 DIAGNOSIS — M25511 Pain in right shoulder: Secondary | ICD-10-CM | POA: Diagnosis not present

## 2022-02-26 DIAGNOSIS — M25562 Pain in left knee: Secondary | ICD-10-CM | POA: Diagnosis not present

## 2022-02-28 ENCOUNTER — Other Ambulatory Visit: Payer: Self-pay

## 2022-02-28 ENCOUNTER — Telehealth: Payer: Self-pay | Admitting: *Deleted

## 2022-02-28 MED ORDER — LEVOTHYROXINE SODIUM 112 MCG PO TABS
112.0000 ug | ORAL_TABLET | Freq: Every day | ORAL | 0 refills | Status: DC
Start: 2022-02-28 — End: 2022-05-31

## 2022-02-28 NOTE — Telephone Encounter (Signed)
levothyroxine (SYNTHROID) 112 MCG tablet, refill request @  [id number]- [place], [place] - [address] AT [id number].

## 2022-02-28 NOTE — Telephone Encounter (Signed)
Patient's wife called in stating patient is having SHOB with activity only; none at rest. States he will do fine at PT then comes home and sleeps for 3 hours. Also, has stiff neck, but he sleeps in reliner at times. Given first available PM appt per wife's request (11/8 at 1415). She is advised to take him to ED if he develops [facility] at rest.

## 2022-03-05 ENCOUNTER — Encounter: Payer: Self-pay | Admitting: Internal Medicine

## 2022-03-05 ENCOUNTER — Other Ambulatory Visit: Payer: Self-pay

## 2022-03-05 ENCOUNTER — Telehealth: Payer: Self-pay

## 2022-03-05 ENCOUNTER — Ambulatory Visit (INDEPENDENT_AMBULATORY_CARE_PROVIDER_SITE_OTHER): Payer: Medicare Other | Admitting: Internal Medicine

## 2022-03-05 ENCOUNTER — Encounter: Payer: Self-pay | Admitting: Dietician

## 2022-03-05 VITALS — BP 133/81 | HR 77 | Temp 97.8°F | Ht 72.0 in | Wt 226.9 lb

## 2022-03-05 DIAGNOSIS — N3943 Post-void dribbling: Secondary | ICD-10-CM | POA: Diagnosis not present

## 2022-03-05 DIAGNOSIS — D696 Thrombocytopenia, unspecified: Secondary | ICD-10-CM

## 2022-03-05 DIAGNOSIS — N401 Enlarged prostate with lower urinary tract symptoms: Secondary | ICD-10-CM | POA: Diagnosis not present

## 2022-03-05 DIAGNOSIS — S161XXA Strain of muscle, fascia and tendon at neck level, initial encounter: Secondary | ICD-10-CM

## 2022-03-05 DIAGNOSIS — N3941 Urge incontinence: Secondary | ICD-10-CM | POA: Diagnosis not present

## 2022-03-05 DIAGNOSIS — K922 Gastrointestinal hemorrhage, unspecified: Secondary | ICD-10-CM | POA: Diagnosis not present

## 2022-03-05 DIAGNOSIS — T148XXA Other injury of unspecified body region, initial encounter: Secondary | ICD-10-CM | POA: Diagnosis not present

## 2022-03-05 DIAGNOSIS — K921 Melena: Secondary | ICD-10-CM | POA: Diagnosis not present

## 2022-03-05 MED ORDER — PANTOPRAZOLE SODIUM 40 MG PO TBEC: 40.0000 mg | DELAYED_RELEASE_TABLET | Freq: Every day | ORAL | 1 refills | Status: DC

## 2022-03-05 MED ORDER — FINASTERIDE 5 MG PO TABS: 5.0000 mg | ORAL_TABLET | Freq: Every day | ORAL | 11 refills | Status: DC

## 2022-03-05 MED ORDER — DICLOFENAC SODIUM 1 % EX GEL: 2.0000 g | Freq: Four times a day (QID) | CUTANEOUS | 3 refills | Status: DC

## 2022-03-05 NOTE — Progress Notes (Signed)
Subjective:  CC: black stool, stiff neck, and urinary frequency  HPI:  Jonathon [name] is a 71 y.o. male with a past medical history stated below and presents today for black stool. This happed on Saturday and Sunday of last week.  He denies abdominal pain during episodes. Please see problem based assessment and plan for additional details.  Past Medical History:  Diagnosis Date   Anemia due to blood loss, acute 2014   2nd GIB   Anxiety    CKD (chronic kidney disease), stage III ([place]) 2014   Diabetes mellitus without complication (HCC)    Gout    High cholesterol    Hypertension 2014   Hypothyroid    Neuropathy    PUD (peptic ulcer disease) 2014    Current Outpatient Medications on File Prior to Visit  Medication Sig Dispense Refill   albuterol (VENTOLIN HFA) 108 (90 Base) MCG/ACT inhaler Inhale 2 puffs into the lungs every 6 (six) hours as needed for wheezing or shortness of breath. 8 g 2   aspirin EC 81 MG EC tablet Take 1 tablet (81 mg total) by mouth daily.     atorvastatin (LIPITOR) 80 MG tablet Take 1 tablet (80 mg total) by mouth at bedtime. 90 tablet 3   dextromethorphan-guaiFENesin (MUCINEX DM) 30-600 MG 12hr tablet Take 1 tablet by mouth 2 (two) times daily. 30 tablet 0   fenofibrate 54 MG tablet TAKE 1 TABLET(54 MG) BY MOUTH DAILY (Patient taking differently: Take 54 mg by mouth daily.) 90 tablet 1   ferrous sulfate 325 (65 FE) MG tablet Take 1 tablet (325 mg total) by mouth daily with breakfast. 30 tablet 3   fluticasone (FLONASE) 50 MCG/ACT nasal spray Place 1 spray into both nostrils daily. 11.1 mL 1   JARDIANCE 10 MG TABS tablet TAKE 1 TABLET BY MOUTH EVERY DAY (Patient taking differently: Take 10 mg by mouth daily.) 90 tablet 1   levothyroxine (SYNTHROID) 112 MCG tablet Take 1 tablet (112 mcg total) by mouth at bedtime. 90 tablet 0   Magnesium 250 MG TABS Take 1 tablet (250 mg total) by mouth daily. 30 tablet 0   Melatonin 10 MG TABS Take 10 mg by mouth at  bedtime.     metoprolol succinate (TOPROL-XL) 25 MG 24 hr tablet TAKE 3 TABLETS BY MOUTH DAILY 270 tablet 3   pregabalin (LYRICA) 75 MG capsule Take 2 capsules (150 mg total) by mouth at bedtime. (Patient taking differently: Take 75 mg by mouth at bedtime.) 180 capsule 0   sertraline (ZOLOFT) 100 MG tablet TAKE 1 TABLET BY MOUTH EVERY DAY (Patient taking differently: Take 100 mg by mouth daily.) 90 tablet 4   tamsulosin (FLOMAX) 0.4 MG CAPS capsule TAKE 1 CAPSULE BY MOUTH EVERY DAY 90 capsule 1   traZODone (DESYREL) 100 MG tablet Take 1 tablet (100 mg total) by mouth at bedtime. 90 tablet 1   XARELTO 20 MG TABS tablet TAKE 1 TABLET BY MOUTH EVERY DAY (Patient taking differently: Take 20 mg by mouth daily.) 30 tablet 5   No current facility-administered medications on file prior to visit.    Family History  Problem Relation Age of Onset   Diabetes Mellitus II Maternal Aunt    Lung cancer Mother    Pancreatic cancer Sister     Social History   Socioeconomic History   Marital status: Married    Spouse name: Not on file   Number of children: Not on file   Years of education:  Not on file   Highest education level: Not on file  Occupational History   Occupation: banking    Comment: retired  Tobacco Use   Smoking status: Former    Packs/day: 2.00    Years: 3.00    Total pack years: 6.00    Types: Cigarettes   Smokeless tobacco: Former    Quit date: 05/08/1967  Vaping Use   Vaping Use: Never used  Substance and Sexual Activity   Alcohol use: Yes    Alcohol/week: 14.0 standard drinks of alcohol    Types: 14 Shots of liquor per week    Comment: daily   Drug use: Yes    Types: Marijuana   Sexual activity: Not on file  Other Topics Concern   Not on file  Social History Narrative   Not on file   Social Determinants of Health   Financial Resource Strain: Not on file  Food Insecurity: No Food Insecurity (03/05/2022)   Hunger Vital Sign    Worried About Running Out of Food in  the Last Year: Never true    Ran Out of Food in the Last Year: Never true  Transportation Needs: No Transportation Needs (03/05/2022)   PRAPARE - Hydrologist (Medical): No    Lack of Transportation (Non-Medical): No  Physical Activity: Not on file  Stress: Not on file  Social Connections: Socially Integrated (03/05/2022)   Social Connection and Isolation Panel [NHANES]    Frequency of Communication with Friends and Family: Three times a week    Frequency of Social Gatherings with Friends and Family: Three times a week    Attends Religious Services: More than 4 times per year    Active Member of Clubs or Organizations: Yes    Attends Archivist Meetings: Never    Marital Status: Married  Human resources officer Violence: Not At Risk (03/05/2022)   Humiliation, Afraid, Rape, and Kick questionnaire    Fear of Current or Ex-Partner: No    Emotionally Abused: No    Physically Abused: No    Sexually Abused: No    Review of Systems: ROS negative except for what is noted on the assessment and plan.  Objective:   Vitals:   03/05/22 1449  BP: 133/81  Pulse: 77  Temp: 97.8 F (36.6 C)  TempSrc: Oral  SpO2: 94%  Weight: 226 lb 14.4 oz (102.9 kg)  Height: 6' (1.829 m)    Physical Exam: Constitutional: well-appearing Cardiovascular: regular rate and rhythm, no m/r/g Pulmonary/Chest: normal work of breathing on room air, lungs clear to auscultation bilaterally Abdominal: soft, non-tender, non-distended MSK: normal bulk and tone Skin: warm and dry  Assessment & Plan:  Thrombocytopenia ([place]) Repeat CBC completed in setting of melanotic stools over the weekend. Hgb stable but platelets have continued to trend down to 130s. He did have schistocytes on peripheral smear during admission for lower GI bleed 5/23.He denies dizziness and shortness of breath. P: At follow up in 3 months, plan to repeat CBC and blood smear.  Strain of neck muscle He woke up  last week and his neck felt stiff. He has tried gentle stretching, head and tylenol with minimal relief. Denies fever and chills.  On exam no tenderness over vertebral or TP process. His range of motion is limited in rotation and side bending. A/P: I talked with him about making sure that his head is supported well when he sleeps, his wife states that he often falls asleep in recliner with  head tilted forward.  -conservative therapy with gentle stretching, heat, and tylenol -diclofenac gel  Urge incontinence He continues to have urinary frequency for greater than a year. PSA wnl 2022 and he was started on tamsulosin. He denies difficulty with starting stream or emptying bladder. He does have to go about 30 minutes after relieving himself. A/P: Continue tamsulosin 0.4 mg qd, he does report occasional episodes of feeling dizzy with standing so will avoid titrating this medication. Start finasteride 5 mg qd If symptoms persist would consider urology referral  Melena Presents with 2 days of black stools. He had 3-4 episodes and has not has further episodes in 3 days. He does take xarelto for afib and iron, which are not new medications for him. Denies abdominal pain during episodes. He does have history of lower GI bleed with bright red blood in 5/23 requiring 2 clips placed in ascending cecum.  A: Upper GI bleed P: For now will continue to monitor as UGIB was self limiting. CBC was rechecked and Hgb at 15. Start PPI Return precautions given if he has recurrent episode of bleeding.    Patient discussed with Dr. Hansel Starling Shamaine Mulkern, D.O. [facility]  PGY-2 Pager: [phone]  Phone: [phone] Date 03/07/2022  Time 2:55 PM

## 2022-03-05 NOTE — Patient Instructions (Addendum)
Thank you, [name] for allowing [place] to provide your care today.   Bleeding I am checking blood count today. I will call with results. I think you had some bleeding over the weekend that has now stopped. Please start taking protonix daily to help that heal.  Urinary incontinence Continue taking tamsulosin. There is another medication called finesteride that I would like you to take. It helps shrink your prostate.  Neck pain You have a muscle strain. The best thing you can do is help make sure you have good head support when you are sleeping. It will take several weeks for this to get better. I am sending in a gel that you can try as well. Please stretch you neck muscles.   I have ordered the following medication/changed the following medications:   Stop the following medications: There are no discontinued medications.   Start the following medications: No orders of the defined types were placed in this encounter.    Follow up: 3 months   We look forward to seeing you next time. Please call our clinic at [phone] if you have any questions or concerns. The best time to call is Monday-Friday from 9am-4pm, but there is someone available 24/7. If after hours or the weekend, call the main hospital number and ask for the Internal Medicine Resident On-Call. If you need medication refills, please notify your pharmacy one week in advance and they will send [place] a request.   Thank you for trusting me with your care. Wishing you the best!   Christiana Fuchs, [place]

## 2022-03-05 NOTE — Patient Outreach (Signed)
  Care Coordination   In Person Provider Office Visit Note   03/05/2022 Name: Jonathon [name] MRN: [id number] DOB: 10/11/1950  Jonathon [name] is a 71 y.o. year old male who sees Leigh Aurora, DO for primary care. I engaged with Jonathon [name] in the providers office today.  What matters to the patients health and wellness today?  Spoke with [name] and his wife in the clinic and explained the care coordination services.  At this time they are not in need of any assistance.  Provided contact phone number in case they need assistance in the future.    Goals Addressed               This Visit's Progress     COMPLETED: "I don't need any services at this time" (pt-stated)        Care Coordination Interventions: Discussed plans with [name] for ongoing care management follow up and provided [name] with direct contact information for care management team Assessed social determinant of health barriers          SDOH assessments and interventions completed:  Yes  SDOH Interventions Today    Flowsheet Row Most Recent Value  SDOH Interventions   Transportation Interventions Intervention Not Indicated  Utilities Interventions Intervention Not Indicated        Care Coordination Interventions Activated:  Yes  Care Coordination Interventions:  No, not indicated   Follow up plan: No further intervention required.   Encounter Outcome:  Pt. Visit Completed

## 2022-03-06 LAB — CBC
Hematocrit: 48.1 % (ref 37.5–51.0)
Hemoglobin: 15.9 g/dL (ref 13.0–17.7)
MCH: 28.6 pg (ref 26.6–33.0)
MCHC: 33.1 g/dL (ref 31.5–35.7)
MCV: 87 fL (ref 79–97)
Platelets: 133 10*3/uL — ABNORMAL LOW (ref 150–450)
RBC: 5.56 x10E6/uL (ref 4.14–5.80)
RDW: 21.9 % — ABNORMAL HIGH (ref 11.6–15.4)
WBC: 5.4 10*3/uL (ref 3.4–10.8)

## 2022-03-07 DIAGNOSIS — K921 Melena: Secondary | ICD-10-CM | POA: Insufficient documentation

## 2022-03-07 DIAGNOSIS — M542 Cervicalgia: Secondary | ICD-10-CM | POA: Insufficient documentation

## 2022-03-07 DIAGNOSIS — S161XXA Strain of muscle, fascia and tendon at neck level, initial encounter: Secondary | ICD-10-CM | POA: Insufficient documentation

## 2022-03-07 NOTE — Assessment & Plan Note (Signed)
Repeat CBC completed in setting of melanotic stools over the weekend. Hgb stable but platelets have continued to trend down to 130s. He did have schistocytes on peripheral smear during admission for lower GI bleed 5/23.He denies dizziness and shortness of breath. P: At follow up in 3 months, plan to repeat CBC and blood smear.

## 2022-03-07 NOTE — Assessment & Plan Note (Addendum)
He continues to have urinary frequency for greater than a year. PSA wnl 2022 and he was started on tamsulosin. He denies difficulty with starting stream or emptying bladder. He does have to go about 30 minutes after relieving himself. A/P: Continue tamsulosin 0.4 mg qd, he does report occasional episodes of feeling dizzy with standing so will avoid titrating this medication. Start finasteride 5 mg qd If symptoms persist would consider urology referral

## 2022-03-07 NOTE — Assessment & Plan Note (Signed)
He woke up last week and his neck felt stiff. He has tried gentle stretching, head and tylenol with minimal relief. Denies fever and chills.  On exam no tenderness over vertebral or TP process. His range of motion is limited in rotation and side bending. A/P: I talked with him about making sure that his head is supported well when he sleeps, his wife states that he often falls asleep in recliner with head tilted forward.  -conservative therapy with gentle stretching, heat, and tylenol -diclofenac gel

## 2022-03-07 NOTE — Assessment & Plan Note (Signed)
Presents with 2 days of black stools. He had 3-4 episodes and has not has further episodes in 3 days. He does take xarelto for afib and iron, which are not new medications for him. Denies abdominal pain during episodes. He does have history of lower GI bleed with bright red blood in 5/23 requiring 2 clips placed in ascending cecum.  A: Upper GI bleed P: For now will continue to monitor as UGIB was self limiting. CBC was rechecked and Hgb at 15. Start PPI Return precautions given if he has recurrent episode of bleeding.

## 2022-03-10 ENCOUNTER — Ambulatory Visit: Payer: Medicare Other | Admitting: Cardiovascular Disease

## 2022-03-10 DIAGNOSIS — M25511 Pain in right shoulder: Secondary | ICD-10-CM | POA: Diagnosis not present

## 2022-03-10 DIAGNOSIS — M25562 Pain in left knee: Secondary | ICD-10-CM | POA: Diagnosis not present

## 2022-03-10 DIAGNOSIS — M25561 Pain in right knee: Secondary | ICD-10-CM | POA: Diagnosis not present

## 2022-03-10 DIAGNOSIS — M25512 Pain in left shoulder: Secondary | ICD-10-CM | POA: Diagnosis not present

## 2022-03-11 NOTE — Progress Notes (Signed)
Internal Medicine Clinic Attending  Case discussed with Dr. Howie Ill  At the time of the visit.  We reviewed the resident's history and exam and pertinent patient test results.  I agree with the assessment, diagnosis, and plan of care documented in the resident's note. I do feel comfortable with the interval of f/u given resolution of the melena and stability of his hg, and with initiation of PPI, understanding he has had a hx of UGIB in recent months and has an ongoing indication for anticoagulation.  The platelet level is not contributing to bleeding risk at this time.  He knows to immediately seek care for symptoms of lightheadedness, or recurrent melena.

## 2022-03-13 DIAGNOSIS — M25561 Pain in right knee: Secondary | ICD-10-CM | POA: Diagnosis not present

## 2022-03-13 DIAGNOSIS — M25562 Pain in left knee: Secondary | ICD-10-CM | POA: Diagnosis not present

## 2022-03-13 DIAGNOSIS — M25511 Pain in right shoulder: Secondary | ICD-10-CM | POA: Diagnosis not present

## 2022-03-13 DIAGNOSIS — M25512 Pain in left shoulder: Secondary | ICD-10-CM | POA: Diagnosis not present

## 2022-03-17 DIAGNOSIS — M25511 Pain in right shoulder: Secondary | ICD-10-CM | POA: Diagnosis not present

## 2022-03-17 DIAGNOSIS — M25512 Pain in left shoulder: Secondary | ICD-10-CM | POA: Diagnosis not present

## 2022-03-17 DIAGNOSIS — M25561 Pain in right knee: Secondary | ICD-10-CM | POA: Diagnosis not present

## 2022-03-17 DIAGNOSIS — M25562 Pain in left knee: Secondary | ICD-10-CM | POA: Diagnosis not present

## 2022-03-18 ENCOUNTER — Other Ambulatory Visit: Payer: Self-pay | Admitting: Medical

## 2022-03-18 NOTE — Telephone Encounter (Signed)
This is Dr. O'Neal's pt 

## 2022-03-19 DIAGNOSIS — M25512 Pain in left shoulder: Secondary | ICD-10-CM | POA: Diagnosis not present

## 2022-03-19 DIAGNOSIS — M25511 Pain in right shoulder: Secondary | ICD-10-CM | POA: Diagnosis not present

## 2022-03-19 DIAGNOSIS — M25561 Pain in right knee: Secondary | ICD-10-CM | POA: Diagnosis not present

## 2022-03-19 DIAGNOSIS — M25562 Pain in left knee: Secondary | ICD-10-CM | POA: Diagnosis not present

## 2022-03-25 ENCOUNTER — Telehealth: Payer: Self-pay

## 2022-03-25 NOTE — Telephone Encounter (Signed)
-----   Message from Algernon Huxley, RN sent at 01/22/2022  4:06 PM EDT ----- Regarding: Labs Pt needs labs, orders in epic.

## 2022-03-25 NOTE — Telephone Encounter (Signed)
Spoke with patient's wife to remind her that pt is due for repeat labs at this time. No appointment is necessary. Patient's wife is aware that pt can stop by the lab in the basement at his convenience between 7:30 AM - 5 PM, Monday through Friday. Patient's wife verbalized understanding and had no concerns at the end of the call.   

## 2022-03-28 ENCOUNTER — Other Ambulatory Visit: Payer: Self-pay | Admitting: Internal Medicine

## 2022-03-28 DIAGNOSIS — K922 Gastrointestinal hemorrhage, unspecified: Secondary | ICD-10-CM

## 2022-04-11 ENCOUNTER — Other Ambulatory Visit: Payer: Self-pay

## 2022-04-11 ENCOUNTER — Other Ambulatory Visit: Payer: Self-pay | Admitting: Internal Medicine

## 2022-04-11 MED ORDER — RIVAROXABAN 20 MG PO TABS: 20.0000 mg | ORAL_TABLET | Freq: Every day | ORAL | 3 refills | Status: DC

## 2022-04-11 MED ORDER — EMPAGLIFLOZIN 10 MG PO TABS: 10.0000 mg | ORAL_TABLET | Freq: Every day | ORAL | 1 refills | Status: DC

## 2022-04-11 NOTE — Progress Notes (Signed)
Received a call from pt's wife stating pt was out of his Xarelto for one week. Pt takes the Xarelto for the atrial fibrillation. Refill sent to pt's pharmacy.   Idamae Schuller, MD [facility]ie Rung. [facility] Internal Medicine Residency, PGY-2

## 2022-04-13 ENCOUNTER — Other Ambulatory Visit: Payer: Self-pay | Admitting: Gastroenterology

## 2022-04-15 ENCOUNTER — Other Ambulatory Visit: Payer: Self-pay | Admitting: Student

## 2022-04-17 ENCOUNTER — Other Ambulatory Visit: Payer: Self-pay | Admitting: Student

## 2022-05-15 ENCOUNTER — Other Ambulatory Visit: Payer: Self-pay | Admitting: Internal Medicine

## 2022-05-26 ENCOUNTER — Other Ambulatory Visit: Payer: Self-pay | Admitting: Student

## 2022-05-29 ENCOUNTER — Other Ambulatory Visit: Payer: Self-pay

## 2022-05-29 ENCOUNTER — Encounter: Payer: Self-pay | Admitting: Student

## 2022-05-29 ENCOUNTER — Ambulatory Visit (INDEPENDENT_AMBULATORY_CARE_PROVIDER_SITE_OTHER): Payer: Medicare Other | Admitting: Student

## 2022-05-29 VITALS — BP 141/99 | HR 68 | Temp 98.1°F | Ht 72.0 in | Wt 227.0 lb

## 2022-05-29 DIAGNOSIS — I4811 Longstanding persistent atrial fibrillation: Secondary | ICD-10-CM

## 2022-05-29 DIAGNOSIS — D509 Iron deficiency anemia, unspecified: Secondary | ICD-10-CM

## 2022-05-29 DIAGNOSIS — N401 Enlarged prostate with lower urinary tract symptoms: Secondary | ICD-10-CM | POA: Diagnosis not present

## 2022-05-29 DIAGNOSIS — E1121 Type 2 diabetes mellitus with diabetic nephropathy: Secondary | ICD-10-CM

## 2022-05-29 DIAGNOSIS — J309 Allergic rhinitis, unspecified: Secondary | ICD-10-CM

## 2022-05-29 DIAGNOSIS — E119 Type 2 diabetes mellitus without complications: Secondary | ICD-10-CM | POA: Diagnosis not present

## 2022-05-29 DIAGNOSIS — I4891 Unspecified atrial fibrillation: Secondary | ICD-10-CM

## 2022-05-29 DIAGNOSIS — D696 Thrombocytopenia, unspecified: Secondary | ICD-10-CM | POA: Diagnosis not present

## 2022-05-29 DIAGNOSIS — D751 Secondary polycythemia: Secondary | ICD-10-CM

## 2022-05-29 DIAGNOSIS — N3941 Urge incontinence: Secondary | ICD-10-CM

## 2022-05-29 DIAGNOSIS — Z7984 Long term (current) use of oral hypoglycemic drugs: Secondary | ICD-10-CM | POA: Diagnosis not present

## 2022-05-29 DIAGNOSIS — K921 Melena: Secondary | ICD-10-CM

## 2022-05-29 DIAGNOSIS — N3943 Post-void dribbling: Secondary | ICD-10-CM | POA: Diagnosis not present

## 2022-05-29 DIAGNOSIS — E039 Hypothyroidism, unspecified: Secondary | ICD-10-CM

## 2022-05-29 DIAGNOSIS — Z1159 Encounter for screening for other viral diseases: Secondary | ICD-10-CM

## 2022-05-29 DIAGNOSIS — Z0000 Encounter for general adult medical examination without abnormal findings: Secondary | ICD-10-CM

## 2022-05-29 LAB — POCT GLYCOSYLATED HEMOGLOBIN (HGB A1C): Hemoglobin A1C: 5.7 % — AB (ref 4.0–5.6)

## 2022-05-29 LAB — GLUCOSE, CAPILLARY: Glucose-Capillary: 135 mg/dL — ABNORMAL HIGH (ref 70–99)

## 2022-05-29 NOTE — Patient Instructions (Addendum)
Thank you, [name] for allowing [place] to provide your care today. Today we discussed your urinary incontinence, BPH, nasal congestion, hypothyroidism, diabetes, dark stools and weakness.  Make sure to use your Flonase daily  For your urinary incontinence, please follow the following timed void instructions:  -When you wake up in the morning, the first thing you should do is empty your bladder. -Watch the clock. You should try to void every 2 hours. If you drink a lot of fluids or have caffeinated drinks you may need to void every 1-2 hours. -Try to not go more than 3 hours without visiting the toilet. If you wait too long you will be more prone to accidents. -You should go to the washroom just before you go to sleep.  Your orthostatic vitals were positive today, I want you to try to drink 2-3 bottles of water daily.  I have ordered the following labs for you:   Lab Orders         Glucose, capillary         CBC no Diff         TSH         PSA         Hepatitis C Ab reflex to Quant PCR         POC Hbg A1C      I will call if any are abnormal. All of your labs can be accessed through "My Chart".   My Chart Access: [email or web address]  Please follow-up in 3 months  Please make sure to arrive 15 minutes prior to your next appointment. If you arrive late, you may be asked to reschedule.    We look forward to seeing you next time. Please call our clinic at [phone] if you have any questions or concerns. The best time to call is Monday-Friday from 9am-4pm, but there is someone available 24/7. If after hours or the weekend, call the main hospital number and ask for the Internal Medicine Resident On-Call. If you need medication refills, please notify your pharmacy one week in advance and they will send [place] a request.   Thank you for letting us take part in your care. Wishing you the best!  Lacinda Axon, MD 05/29/2022, 3:12 PM IM  Resident, PGY-3 [name] 41:10

## 2022-05-29 NOTE — Progress Notes (Unsigned)
CC: Diabetes/melena/urinary incontinence  HPI:  [name] is a 72 y.o. male with PMH as below who presents to clinic accompanied by spouse for follow-up on his chronic medical problems including diabetes, urinary incontinence and dark stools. Please see problem based charting for evaluation, assessment and plan.  Past Medical History:  Diagnosis Date   Anemia due to blood loss, acute 2014   2nd GIB   Anxiety    CKD (chronic kidney disease), stage III ([place]) 2014   Diabetes mellitus without complication ([place])    Gout    High cholesterol    Hypertension 2014   Hypothyroid    Neuropathy    PUD (peptic ulcer disease) 2014    Review of Systems:  Constitutional: Negative for fever. Positive for occasional fatigue HEENT: Positive for nasal congestion. Negative for cough.  Respiratory: Positive for occasional shortness of breath at night Cardiac: negative for chest pain or palpitations GU: Positive for urinary incontinence. Negative for hematuria or dysuria. Abdomen: Positive for dark stools. Negative for diarrhea, bloody stools, abdominal pain or bowel incontinence. Neuro: Positive for generalized weakness. Negative for dizziness, numbness or tingling.  Physical Exam: General: Pleasant, well-appearing elderly man. No acute distress. HEENT: Mild erythema bilateral nasal mucosa. No sinus tenderness. Normal oropharyngeal exam. Cardiac: Regular rate. Irregularly irregular rhythm. No murmurs, rubs or gallops. No LE edema Respiratory: Lungs CTAB. No wheezing, rhonchi or crackles. Abdominal: Soft, symmetric and non tender. Normal BS. Skin: Warm, dry. No bruising.  Extremities: Amputated right big toe. 1+ DP pulses and symmetric. Neuro: A&O x 3. Moves all extremities. Normal sensation to gross touch. Psych: Appropriate mood and affect.  Vitals:   05/29/22 1327  BP: (!) 137/94  Pulse: 68  Temp: 98.1 F (36.7 C)  TempSrc: Oral  SpO2: 94%  Weight: 227 lb (103 kg)   Height: 6' (1.829 m)    Assessment & Plan:   Urge incontinence Patient with history of BPH and urge incontinence here for follow up. Patient states since his last OV, he has continued to have urinary incontinence. He states he usually has an urge to urinate but usually unable to make it to the restroom. He states he is able to completely empty his bladder but he does report some involuntary dribbling. He denies any dysuria or hematuria. States he has not noticed much of a difference since starting the finasteride about 3 months ago. Patient likely has a component of overflow incontinence as well. Patient is orthostatic positive in clinic today so will hold off on increase dose of his tamsulosin. Discuss with patient and wife plan for bladder training to help patient regain control of bladder function while waiting for full effect of finasteride.   Plan:  -Initiate timed voiding -Continue Tamsulosin 0.4 mg daily -Continue Finasteride 5 mg daily -Follow up in 2-3 months or as needed.   BPH (benign prostatic hyperplasia) Patient continues to endorse frequent urination, weak stream and postvoid dribbling. He has not noticed much difference in symptoms after initiating finasteride about 3 months ago. PSA today is normal at 1.4. Patient orthostatic here in clinic but denies feeling dizzy at home. Per chart review, patient's BPH symptoms worsened last time his Flomax was discontinued. Will hold off on making any changes to his Flomax today. Advised patient to follow up if he starts getting lightheaded, especially when standing up. Advised to increase his intake of water and while this might worsen his urinary frequency, the timed voiding will give him better control of his bladder.  Plan: -Continue Tamsulosin 0.4 mg daily -Continue Finasteride 5 mg daily -Follow up in 2-3 months or as needed  DM (diabetes mellitus), type 2 with renal complications (HCC) O2D improved to 5.7% today.  -Continue  Jardiance 10 mg daily -Repeat A1c in 6 months  Melena Patient with history LGIB and gastritis here for follow up on dark stools. Patient reports he has been noticing some dark stools about once or twice a week since December. He denies any blood in his stools, blood after wiping or constipation. He has intermittent fatigue but no change to this. CBC today shows hgb/hct of 18/54. Patient's dark stools could be due to his iron supplements.   Plan: -Continue to monitor -Iron studies at next OV -Follow up with GI as needed.   Hypothyroidism TSH today is normal at 1.75. Patient reports intermittent fatigue but overall doing well. States he found PT helpful last time but not interesting in doing more PT at the moment.  -Refill levothyroxine 112 mcg daily.   Allergic rhinitis Patient with history of allergic rhinitis here for evaluation of nasal congestion and occasional SOB at night due to feeling congested in his nose. He denies any runny nose or itchy eyes. Per spouse, patient has Flonase at home but has stopped using it. He has mild erythema of the bilateral nasal mucosa on exam. Patient advised to use his flonase daily at bedtime and try OTC saline rinse or spray.   Plan: -Resume Flonase, 1 spray into each nostrils daily at bedtime -OTC Saline rinse or spray daily as needed for congestion  Iron deficiency anemia Lab work pending from GI office for iron studies. Denies any change in his intermittent fatigue. Hemoglobin is elevated at 18 today. Normal RDW and improved MCV likely indicating improvement in IDA. He has some dark stools likely due to his iron supplements.    Plan: -Continue supplements every other day -Follow up with GI, future order for iron studies pending   Health care maintenance Hepatitis C screening is negative today.  Atrial fibrillation ([place]) Patient remains in afib on exam. Heart rate is stable in the 60s. He has some baseline fatigue but no dizziness,  palpitations, chest pain or syncope. He denies any signs of bleeding while on the xarelto.   Plan: -Continue Xarelto 20 mg daily -Continue metoprolol 75 mg daily, can consider decreasing to 50 at next Ov if HR remains in the 60s.   Polycythemia Patient's CBC today shows elevated hgb/hct of 18/54 from 15.9/48 about 3 months ago. Could be 2/2 hemo-concentrate from dehydration as patient is slightly dry on exam, orthostatic positive and has rise in most of his cell lines vs an increase production of erythrocytes as a polycythemia. Patient meets criteria for polycythemia. Patient quitting tobacco many years ago. He has a history of OSA but lost weight and he was able to come off the CPAP a few years ago. No history of COPD or Asthma. Recommend rechecking CBC at next OV and if still elevated, work up primary vs secondary polycythemia.   Plan: -CBC w/ diff at next OV, if hgb/hct still elevated, check EPO levels   Thrombocytopenia (HCC) Hemoglobin stable with slight rise to 140 on CBC today. No evidence of bruising on exam.  -Check CBC w/ diff at next OV   See Encounters Tab for problem based charting.  Patient discussed with Dr.  Thomes Cake, MD, MPH

## 2022-05-30 ENCOUNTER — Ambulatory Visit (INDEPENDENT_AMBULATORY_CARE_PROVIDER_SITE_OTHER): Payer: Medicare Other

## 2022-05-30 ENCOUNTER — Other Ambulatory Visit: Payer: Self-pay | Admitting: Student

## 2022-05-30 VITALS — BP 137/94 | HR 68 | Temp 98.1°F | Ht 72.0 in | Wt 227.0 lb

## 2022-05-30 DIAGNOSIS — Z0000 Encounter for general adult medical examination without abnormal findings: Secondary | ICD-10-CM

## 2022-05-30 NOTE — Progress Notes (Signed)
Subjective:   Jonathon [name] is a 72 y.o. male who presents for an Initial Medicare Annual Wellness Visit. I connected with  Jonathon [name] on 05/30/22 by a  Face-To-Face  enabled telemedicine application and verified that I am speaking with the correct person using two identifiers.  [name] Location: Other:  Office/Clinic  Provider Location: Office/Clinic  I discussed the limitations of evaluation and management by telemedicine. [name] expressed understanding and agreed to proceed.  Review of Systems    Defer to PCP       Objective:    Today's Vitals   05/30/22 0948  BP: (!) 137/94  Pulse: 68  Temp: 98.1 F (36.7 C)  TempSrc: Oral  SpO2: 94%  Weight: 227 lb (103 kg)  Height: 6' (1.829 m)   Body mass index is 30.79 kg/m.     05/30/2022    9:50 AM 05/29/2022    1:30 PM 03/05/2022    4:53 PM 03/05/2022    2:43 PM 01/20/2022    1:21 PM 10/23/2021    2:29 PM 09/20/2021   10:12 AM  Advanced Directives  Does [name] Have a Medical Advance Directive? Yes Yes  Yes Yes Yes   Type of Advance Directive Living will;Healthcare Power of [place];Living will [place];Living will  Living will [place];Living will   Does [name] want to make changes to medical advance directive?  No - [name] declined       Copy of [place] in Chart? Yes - validated most recent copy scanned in chart (See row information) Yes - validated most recent copy scanned in chart (See row information) Yes - validated most recent copy scanned in chart (See row information)   Yes - validated most recent copy scanned in chart (See row information)   Would [name] like information on creating a medical advance directive?  No - [name] declined     No - [name] declined    Current Medications (verified) Outpatient Encounter Medications as of 05/30/2022  Medication Sig   albuterol (VENTOLIN HFA) 108 (90 Base) MCG/ACT inhaler  Inhale 2 puffs into the lungs every 6 (six) hours as needed for wheezing or shortness of breath.   aspirin EC 81 MG EC tablet Take 1 tablet (81 mg total) by mouth daily.   atorvastatin (LIPITOR) 80 MG tablet TAKE 1 TABLET BY MOUTH EVERYDAY AT BEDTIME   dextromethorphan-guaiFENesin (MUCINEX DM) 30-600 MG 12hr tablet Take 1 tablet by mouth 2 (two) times daily.   diclofenac Sodium (VOLTAREN) 1 % GEL Apply 2 g topically 4 (four) times daily.   empagliflozin (JARDIANCE) 10 MG TABS tablet Take 1 tablet (10 mg total) by mouth daily.   fenofibrate 54 MG tablet TAKE 1 TABLET(54 MG) BY MOUTH DAILY   ferrous sulfate 325 (65 FE) MG tablet TAKE 1 TABLET BY MOUTH EVERY DAY WITH BREAKFAST   finasteride (PROSCAR) 5 MG tablet Take 1 tablet (5 mg total) by mouth daily.   fluticasone (FLONASE) 50 MCG/ACT nasal spray SPRAY 1 SPRAY INTO BOTH NOSTRILS DAILY.   levothyroxine (SYNTHROID) 112 MCG tablet Take 1 tablet (112 mcg total) by mouth at bedtime.   Magnesium 250 MG TABS Take 1 tablet (250 mg total) by mouth daily.   Melatonin 10 MG TABS Take 10 mg by mouth at bedtime.   metoprolol succinate (TOPROL-XL) 25 MG 24 hr tablet TAKE 3 TABLETS BY MOUTH DAILY   pantoprazole (PROTONIX) 40 MG tablet TAKE 1 TABLET BY  MOUTH EVERY DAY   pregabalin (LYRICA) 75 MG capsule Take 2 capsules (150 mg total) by mouth at bedtime. ([name] taking differently: Take 75 mg by mouth at bedtime.)   rivaroxaban (XARELTO) 20 MG TABS tablet Take 1 tablet (20 mg total) by mouth daily.   sertraline (ZOLOFT) 100 MG tablet TAKE 1 TABLET BY MOUTH EVERY DAY ([name] taking differently: Take 100 mg by mouth daily.)   tamsulosin (FLOMAX) 0.4 MG CAPS capsule TAKE 1 CAPSULE BY MOUTH EVERY DAY   traZODone (DESYREL) 100 MG tablet TAKE 1 TABLET BY MOUTH EVERYDAY AT BEDTIME   No facility-administered encounter medications on file as of 05/30/2022.    Allergies (verified) Morphine and related   History: Past Medical History:  Diagnosis Date   Anemia  due to blood loss, acute 2014   2nd GIB   Anxiety    CKD (chronic kidney disease), stage III ([place]) 2014   Diabetes mellitus without complication ([place])    Gout    High cholesterol    Hypertension 2014   Hypothyroid    Neuropathy    PUD (peptic ulcer disease) 2014   Past Surgical History:  Procedure Laterality Date   ABDOMINAL AORTOGRAM W/LOWER EXTREMITY Bilateral 06/27/2019   Procedure: ABDOMINAL AORTOGRAM W/LOWER EXTREMITY;  Surgeon: Elam Dutch, MD;  Location: [facility];  Service: Vascular;  Laterality: Bilateral;   ADRENALECTOMY     AMPUTATION Right 07/18/2019   Procedure: AMPUTATION RIGHT GREAT TOE;  Surgeon: [name], MD;  Location: [facility];  Service: Vascular;  Laterality: Right;   BIOPSY  09/09/2021   Procedure: BIOPSY;  Surgeon: Lavena Bullion, DO;  Location: [facility];  Service: Gastroenterology;;   COLONOSCOPY WITH PROPOFOL N/A 09/09/2021   Procedure: COLONOSCOPY WITH PROPOFOL;  Surgeon: Lavena Bullion, DO;  Location: [facility];  Service: Gastroenterology;  Laterality: N/A;   COLONOSCOPY WITH PROPOFOL N/A 09/20/2021   Procedure: COLONOSCOPY WITH PROPOFOL;  Surgeon: Mauri Pole, MD;  Location: [facility];  Service: Gastroenterology;  Laterality: N/A;   EMBOLECTOMY Right 06/27/2019   Procedure: RIGHT LEG EMBOLECTOMY;  Surgeon: Elam Dutch, MD;  Location: [facility];  Service: Vascular;  Laterality: Right;   ESOPHAGOGASTRODUODENOSCOPY (EGD) WITH PROPOFOL N/A 09/09/2021   Procedure: ESOPHAGOGASTRODUODENOSCOPY (EGD) WITH PROPOFOL;  Surgeon: Lavena Bullion, DO;  Location: [place] [facility];  Service: Gastroenterology;  Laterality: N/A;   HEMOSTASIS CLIP PLACEMENT  09/20/2021   Procedure: HEMOSTASIS CLIP PLACEMENT;  Surgeon: Mauri Pole, MD;  Location: [facility];  Service: Gastroenterology;;   POLYPECTOMY  09/09/2021   Procedure: POLYPECTOMY;  Surgeon: Lavena Bullion, DO;  Location: [facility];  Service: Gastroenterology;;  EGD  and COLON   Family History  Problem Relation Age of Onset   Diabetes Mellitus II Maternal Aunt    Lung cancer Mother    Pancreatic cancer Sister    Social History   Socioeconomic History   Marital status: Married    Spouse name: Not on file   Number of children: Not on file   Years of education: Not on file   Highest education level: Not on file  Occupational History   Occupation: banking    Comment: retired  Tobacco Use   Smoking status: Former    Packs/day: 2.00    Years: 3.00    Total pack years: 6.00    Types: Cigarettes   Smokeless tobacco: Former    Quit date: 05/08/1967  Vaping Use   Vaping Use: Never used  Substance and Sexual Activity  Alcohol use: Yes    Alcohol/week: 14.0 standard drinks of alcohol    Types: 14 Shots of liquor per week    Comment: daily   Drug use: Yes    Types: Marijuana   Sexual activity: Not on file  Other Topics Concern   Not on file  Social History Narrative   Not on file   Social Determinants of Health   Financial Resource Strain: Low Risk  (05/30/2022)   Overall Financial Resource Strain (CARDIA)    Difficulty of Paying Living Expenses: Not hard at all  Food Insecurity: No Food Insecurity (05/30/2022)   Hunger Vital Sign    Worried About Running Out of Food in the Last Year: Never true    Ran Out of Food in the Last Year: Never true  Transportation Needs: No Transportation Needs (05/30/2022)   PRAPARE - Hydrologist (Medical): No    Lack of Transportation (Non-Medical): No  Physical Activity: Inactive (05/30/2022)   Exercise Vital Sign    Days of Exercise per Week: 0 days    Minutes of Exercise per Session: 0 min  Stress: No Stress Concern Present (05/30/2022)   [place]    Feeling of Stress : Not at all  Social Connections: Moderately Isolated (05/30/2022)   Social Connection and Isolation Panel [NHANES]    Frequency of Communication  with Friends and Family: Twice a week    Frequency of Social Gatherings with Friends and Family: Once a week    Attends Religious Services: Never    Marine scientist or Organizations: No    Attends Music therapist: Never    Marital Status: Married    Tobacco Counseling Counseling given: Not Answered   Clinical Intake:  Pre-visit preparation completed: Yes  Pain : No/denies pain     Nutritional Risks: None Diabetes: Yes CBG done?: Yes CBG resulted in Enter/ Edit results?: Yes Did pt. bring in CBG monitor from home?: No  How often do you need to have someone help you when you read instructions, pamphlets, or other written materials from your doctor or pharmacy?: 2 - Rarely What is the last grade level you completed in school?: college  Diabetic?Nutrition Risk Assessment:  Has [name] had any N/V/D within the last 2 months?  No  Does [name] have any non-healing wounds?  No  Has [name] had any unintentional weight loss or weight gain?  No   Diabetes:  Is [name] diabetic?  Yes  If diabetic, was a CBG obtained today?  Yes  Did [name] bring in their glucometer from home?  No  How often do you monitor your CBG's? Every 6 months.   Financial Strains and Diabetes Management:  Are you having any financial strains with the device, your supplies or your medication? No .  Does [name] want to be seen by Chronic Care Management for management of their diabetes?  No  Would [name] like to be referred to a Nutritionist or for Diabetic Management?  No   Diabetic Exams:  Diabetic Eye Exam: Completed 09/02/2021 Diabetic Foot Exam: Completed 08/07/2021    Interpreter Needed?: No  Information entered by :: Lyris Hitchman,cma 05/30/22 9:50am   Activities of Daily Living    05/30/2022    9:51 AM 05/29/2022    1:30 PM  In your present state of health, do you have any difficulty performing the following activities:  Hearing?  1 1   Vision? 0 0  Difficulty concentrating or making decisions? 0 0  Walking or climbing stairs? 1 1  Dressing or bathing? 0 0  Doing errands, shopping? 1 1    [name] Care Team: Leigh Aurora, DO as PCP - General [place], Cassie Freer, MD as PCP - Cardiology (Internal Medicine)  Indicate any recent Medical Services you may have received from other than [facility] providers in the past year (date may be approximate).     Assessment:   This is a routine wellness examination for [place].  Hearing/Vision screen No results found.  Dietary issues and exercise activities discussed:     Goals Addressed   None   Depression Screen    05/30/2022    9:51 AM 05/29/2022    1:30 PM 01/20/2022    1:21 PM 10/23/2021    4:27 PM 09/12/2021    2:42 PM 09/05/2021    2:08 PM 08/01/2021    4:09 PM  PHQ 2/9 Scores  PHQ - 2 Score 0 0 0 3 1 0 2  PHQ- 9 Score    7 1 0 13    Fall Risk    05/30/2022    9:51 AM 05/29/2022    1:30 PM 03/05/2022    2:45 PM 01/20/2022    1:20 PM 10/23/2021    2:27 PM  Fall Risk   Falls in the past year? 0 0 0 0 1  Number falls in past yr: 0 0  0 1  Comment     2  Injury with Fall?  0  0 0  Risk for fall due to : No Fall Risks Impaired balance/gait Impaired balance/gait Impaired balance/gait Other (Comment);Impaired mobility;Impaired balance/gait;History of fall(s)  Risk for fall due to: Comment     Dizzyness  Follow up Falls evaluation completed;Falls prevention discussed Falls evaluation completed;Falls prevention discussed Falls evaluation completed Falls evaluation completed;Falls prevention discussed Falls prevention discussed;Falls evaluation completed    FALL RISK PREVENTION PERTAINING TO THE HOME:  Any stairs in or around the home? Yes  If so, are there any without handrails? Yes  Home free of loose throw rugs in walkways, pet beds, electrical cords, etc? Yes  Adequate lighting in your home to reduce risk of falls? Yes   ASSISTIVE DEVICES UTILIZED TO PREVENT  FALLS:  Life alert? No  Use of a cane, walker or w/c? Yes  Grab bars in the bathroom? Yes  Shower chair or bench in shower? Yes  Elevated toilet seat or a handicapped toilet? Yes   TIMED UP AND GO:  Was the test performed? No .  Length of time to ambulate 10 feet: N/A sec.   Gait slow and steady with assistive device  Cognitive Function:        05/30/2022   10:02 AM  6CIT Screen  What Year? 0 points  What month? 0 points  What time? 0 points  Count back from 20 0 points  Months in reverse 0 points  Repeat phrase 0 points  Total Score 0 points    Immunizations Immunization History  Administered Date(s) Administered   Fluad Quad(high Dose 65+) 01/20/2022   Influenza,inj,Quad PF,6+ Mos 01/12/2020   Moderna Sars-Covid-2 Vaccination 06/02/2019, 07/07/2019   Pneumococcal Conjugate-13 03/27/2020   Pneumococcal Polysaccharide-23 12/29/2011, 03/16/2018   Tdap 09/27/2012, 08/01/2013   Zoster, Live 05/30/2012    TDAP status: Up to date  Flu Vaccine status: Up to date  Pneumococcal vaccine status: Up to date  Covid-19 vaccine status: Completed  vaccines  Qualifies for Shingles Vaccine? No   Zostavax completed No   Shingrix Completed?: No.    Education has been provided regarding the importance of this vaccine. [name] has been advised to call insurance company to determine out of pocket expense if they have not yet received this vaccine. Advised may also receive vaccine at local pharmacy or Health Dept. Verbalized acceptance and understanding.  Screening Tests Health Maintenance  Topic Date Due   Zoster Vaccines- Shingrix (1 of 2) Never done   COVID-19 Vaccine (3 - Moderna risk series) 08/04/2019   FOOT EXAM  08/08/2022   OPHTHALMOLOGY EXAM  09/03/2022   Diabetic kidney evaluation - eGFR measurement  09/22/2022   Diabetic kidney evaluation - Urine ACR  10/24/2022   COLON CANCER SCREENING ANNUAL FOBT  10/25/2022   HEMOGLOBIN A1C  11/27/2022   Medicare Annual Wellness  (AWV)  05/31/2023   DTaP/Tdap/Td (3 - Td or Tdap) 08/02/2023   COLONOSCOPY (Pts 45-59yr Insurance coverage will need to be confirmed)  09/21/2031   Pneumonia Vaccine 72 Years old  Completed   INFLUENZA VACCINE  Completed   Hepatitis C Screening  Completed   HPV VACCINES  Aged Out    Health Maintenance  Health Maintenance Due  Topic Date Due   Zoster Vaccines- Shingrix (1 of 2) Never done   COVID-19 Vaccine (3 - Moderna risk series) 08/04/2019    Colorectal cancer screening: Type of screening: Colonoscopy. Completed 09/20/2021. Repeat every 10 years  Lung Cancer Screening: (Low Dose CT Chest recommended if Age 73-80years, 30 pack-year currently smoking OR have quit w/in 15years.) does not qualify.   Lung Cancer Screening Referral: N/A  Additional Screening:  Hepatitis C Screening: does not qualify; Completed 05/29/2022  Vision Screening: Recommended annual ophthalmology exams for early detection of glaucoma and other disorders of the eye. Is [name] up to date with their annual eye exam?  Yes  Who is the provider or what is the name of the office in which [name] attends annual eye exams? [place] is not established with a provider, would they like to be referred to a provider to establish care? No .   Dental Screening: Recommended annual dental exams for proper oral hygiene  Community Resource Referral / Chronic Care Management: CRR required this visit?  No   CCM required this visit?  No      Plan:     I have personally reviewed and noted the following in [name]'s chart:   Medical and social history Use of alcohol, tobacco or illicit drugs  Current medications and supplements including opioid prescriptions. [name] is not currently taking opioid prescriptions. Functional ability and status Nutritional status Physical activity Advanced directives List of other physicians Hospitalizations, surgeries, and ER visits in previous 12  months Vitals Screenings to include cognitive, depression, and falls Referrals and appointments  In addition, I have reviewed and discussed with [name] certain preventive protocols, quality metrics, and best practice recommendations. A written personalized care plan for preventive services as well as general preventive health recommendations were provided to [name].     JKerin Perna CLake Mary Ronan  05/30/2022   Nurse Notes: Face-To-Face Visit  Mr. GTignor, Thank you for taking time to come for your Medicare Wellness Visit. I appreciate your ongoing commitment to your health goals. Please review the following plan we discussed and let me know if I can assist you in the future.   These are the goals we discussed:  Goals   None     This is a list of the screening recommended for you and due dates:  Health Maintenance  Topic Date Due   Zoster (Shingles) Vaccine (1 of 2) Never done   COVID-19 Vaccine (3 - Moderna risk series) 08/04/2019   Complete foot exam   08/08/2022   Eye exam for diabetics  09/03/2022   Yearly kidney function blood test for diabetes  09/22/2022   Yearly kidney health urinalysis for diabetes  10/24/2022   Stool Blood Test  10/25/2022   Hemoglobin A1C  11/27/2022   Medicare Annual Wellness Visit  05/31/2023   DTaP/Tdap/Td vaccine (3 - Td or Tdap) 08/02/2023   Colon Cancer Screening  09/21/2031   Pneumonia Vaccine  Completed   Flu Shot  Completed   Hepatitis C Screening: USPSTF Recommendation to screen - Ages 48-79 yo.  Completed   HPV Vaccine  Aged Out

## 2022-05-31 LAB — CBC
Hematocrit: 54 % — ABNORMAL HIGH (ref 37.5–51.0)
Hemoglobin: 18 g/dL — ABNORMAL HIGH (ref 13.0–17.7)
MCH: 31.3 pg (ref 26.6–33.0)
MCHC: 33.3 g/dL (ref 31.5–35.7)
MCV: 94 fL (ref 79–97)
Platelets: 140 10*3/uL — ABNORMAL LOW (ref 150–450)
RBC: 5.76 x10E6/uL (ref 4.14–5.80)
RDW: 14.4 % (ref 11.6–15.4)
WBC: 4.9 10*3/uL (ref 3.4–10.8)

## 2022-05-31 LAB — PSA: Prostate Specific Ag, Serum: 1.4 ng/mL (ref 0.0–4.0)

## 2022-05-31 LAB — HCV INTERPRETATION

## 2022-05-31 LAB — HCV AB W REFLEX TO QUANT PCR: HCV Ab: NONREACTIVE

## 2022-05-31 LAB — TSH: TSH: 1.75 u[IU]/mL (ref 0.450–4.500)

## 2022-05-31 MED ORDER — LEVOTHYROXINE SODIUM 112 MCG PO TABS: 112.0000 ug | ORAL_TABLET | Freq: Every day | ORAL | 3 refills | Status: DC

## 2022-06-01 DIAGNOSIS — J309 Allergic rhinitis, unspecified: Secondary | ICD-10-CM | POA: Insufficient documentation

## 2022-06-01 DIAGNOSIS — D751 Secondary polycythemia: Secondary | ICD-10-CM | POA: Insufficient documentation

## 2022-06-01 NOTE — Assessment & Plan Note (Signed)
Patient with history LGIB and gastritis here for follow up on dark stools. Patient reports he has been noticing some dark stools about once or twice a week since December. He denies any blood in his stools, blood after wiping or constipation. He has intermittent fatigue but no change to this. CBC today shows hgb/hct of 18/54. Patient's dark stools could be due to his iron supplements.   Plan: -Continue to monitor -Iron studies at next OV -Follow up with GI as needed.

## 2022-06-01 NOTE — Assessment & Plan Note (Signed)
Patient with history of BPH and urge incontinence here for follow up. Patient states since his last OV, he has continued to have urinary incontinence. He states he usually has an urge to urinate but usually unable to make it to the restroom. He states he is able to completely empty his bladder but he does report some involuntary dribbling. He denies any dysuria or hematuria. States he has not noticed much of a difference since starting the finasteride about 3 months ago. Patient likely has a component of overflow incontinence as well. Patient is orthostatic positive in clinic today so will hold off on increase dose of his tamsulosin. Discuss with patient and wife plan for bladder training to help patient regain control of bladder function while waiting for full effect of finasteride.   Plan:  -Initiate timed voiding -Continue Tamsulosin 0.4 mg daily -Continue Finasteride 5 mg daily -Follow up in 2-3 months or as needed.

## 2022-06-01 NOTE — Assessment & Plan Note (Signed)
Lab work pending from GI office for iron studies. Denies any change in his intermittent fatigue. Hemoglobin is elevated at 18 today. Normal RDW and improved MCV likely indicating improvement in IDA. He has some dark stools likely due to his iron supplements.    Plan: -Continue supplements every other day -Follow up with GI, future order for iron studies pending

## 2022-06-01 NOTE — Assessment & Plan Note (Signed)
Patient with history of allergic rhinitis here for evaluation of nasal congestion and occasional SOB at night due to feeling congested in his nose. He denies any runny nose or itchy eyes. Per spouse, patient has Flonase at home but has stopped using it. He has mild erythema of the bilateral nasal mucosa on exam. Patient advised to use his flonase daily at bedtime and try OTC saline rinse or spray.   Plan: -Resume Flonase, 1 spray into each nostrils daily at bedtime -OTC Saline rinse or spray daily as needed for congestion

## 2022-06-01 NOTE — Assessment & Plan Note (Signed)
A1c improved to 5.7% today.  -Continue Jardiance 10 mg daily -Repeat A1c in 6 months

## 2022-06-01 NOTE — Assessment & Plan Note (Signed)
Hepatitis C screening is negative today.

## 2022-06-01 NOTE — Assessment & Plan Note (Signed)
Patient remains in afib on exam. Heart rate is stable in the 60s. He has some baseline fatigue but no dizziness, palpitations, chest pain or syncope. He denies any signs of bleeding while on the xarelto.   Plan: -Continue Xarelto 20 mg daily -Continue metoprolol 75 mg daily, can consider decreasing to 50 at next Ov if HR remains in the 60s.

## 2022-06-01 NOTE — Assessment & Plan Note (Signed)
Patient continues to endorse frequent urination, weak stream and postvoid dribbling. He has not noticed much difference in symptoms after initiating finasteride about 3 months ago. PSA today is normal at 1.4. Patient orthostatic here in clinic but denies feeling dizzy at home. Per chart review, patient's BPH symptoms worsened last time his Flomax was discontinued. Will hold off on making any changes to his Flomax today. Advised patient to follow up if he starts getting lightheaded, especially when standing up. Advised to increase his intake of water and while this might worsen his urinary frequency, the timed voiding will give him better control of his bladder.   Plan: -Continue Tamsulosin 0.4 mg daily -Continue Finasteride 5 mg daily -Follow up in 2-3 months or as needed

## 2022-06-01 NOTE — Assessment & Plan Note (Signed)
TSH today is normal at 1.75. Patient reports intermittent fatigue but overall doing well. States he found PT helpful last time but not interesting in doing more PT at the moment.  -Refill levothyroxine 112 mcg daily.

## 2022-06-01 NOTE — Progress Notes (Incomplete)
   CC: Diabetes/melena/urinary incontinence  HPI:  [name] is a 72 y.o. male with PMH as below who presents to clinic accompanied by spouse for follow-up on his chronic medical problems including diabetes, urinary incontinence and dark stools. Please see problem based charting for evaluation, assessment and plan.  Past Medical History:  Diagnosis Date  . Anemia due to blood loss, acute 2014   2nd GIB  . Anxiety   . CKD (chronic kidney disease), stage III ([place]) 2014  . Diabetes mellitus without complication ([place])   . Gout   . High cholesterol   . Hypertension 2014  . Hypothyroid   . Neuropathy   . PUD (peptic ulcer disease) 2014    Review of Systems:  Constitutional: Negative for fever. Positive for occasional fatigue HEENT: Positive for nasal congestion.  Respiratory: Positive for occasional shortness of breath at night Cardiac: negative for chest pain or palpitations GU: Positive for urinary incontinence.  Negative for hematuria. Abdomen: Positive for dark stools. Negative for diarrhea, abdominal pain or bowel incontinence. Neuro: Positive for generalized weakness. Negative for dizziness, numbness or tingling.  Physical Exam: General: Pleasant, well-appearing elderly man. No acute distress. HEENT: Mild erythema bilateral nasal mucosa. No sinus tenderness. Normal oropharyngeal exam. Cardiac: Regular rate. Irregularly irregular rhythm. No murmurs, rubs or gallops. No LE edema Respiratory: Lungs CTAB. No wheezing, rhonchi or crackles. Abdominal: Soft, symmetric and non tender. Normal BS. Skin: Warm, dry. No bruising.  Extremities: Amputated right big toe. 1+ DP pulses and symmetric. Neuro: A&O x 3. Moves all extremities. Normal sensation to gross touch. Psych: Appropriate mood and affect.  Vitals:   05/29/22 1327  BP: (!) 137/94  Pulse: 68  Temp: 98.1 F (36.7 C)  TempSrc: Oral  SpO2: 94%  Weight: 227 lb (103 kg)  Height: 6' (1.829 m)    Assessment &  Plan:   No problem-specific Assessment & Plan notes found for this encounter.       See Encounters Tab for problem based charting.  Patient discussed with Dr.  Thomes Cake, MD, MPH

## 2022-06-02 ENCOUNTER — Encounter: Payer: Self-pay | Admitting: Student

## 2022-06-02 NOTE — Assessment & Plan Note (Addendum)
Patient's CBC today shows elevated hgb/hct of 18/54 from 15.9/48 about 3 months ago. Could be 2/2 hemo-concentrate from dehydration as patient is slightly dry on exam, orthostatic positive and has rise in most of his cell lines vs an increase production of erythrocytes as a polycythemia. Patient meets criteria for polycythemia. Patient quitting tobacco many years ago. He has a history of OSA but lost weight and he was able to come off the CPAP a few years ago. No history of COPD or Asthma. Recommend rechecking CBC at next OV and if still elevated, work up primary vs secondary polycythemia.   Plan: -CBC w/ diff at next OV, if hgb/hct still elevated, check EPO levels

## 2022-06-02 NOTE — Progress Notes (Signed)
Called and discussed results with patient and spouse. Will recheck CBC at his next OV and if hgb is still elevated, work up for polycythemia.

## 2022-06-02 NOTE — Assessment & Plan Note (Signed)
Hemoglobin stable with slight rise to 140 on CBC today. No evidence of bruising on exam.  -Check CBC w/ diff at next OV

## 2022-06-03 NOTE — Progress Notes (Signed)
Called and spoke with patient's spouse about repeating lab work in the next 1-2 weeks to ensure resolution of his elevated hemoglobin and pursue workup for polycythemia if still elevated. Spouse will call tomorrow morning to schedule a lab only appointment for next week. I added iron studies to further evaluate his iron deficiency anemia.

## 2022-06-03 NOTE — Addendum Note (Signed)
Addended byLinwood Dibbles on: 06/03/2022 06:11 PM   Modules accepted: Orders

## 2022-06-04 NOTE — Progress Notes (Signed)
Internal Medicine Clinic Attending  Case discussed with Dr. Coy Saunas  At the time of the visit.  We reviewed the resident's history and exam and pertinent patient test results.  I agree with the assessment, diagnosis, and plan of care documented in the resident's note. At next visit, I would consider discussion of discontinuing SGLT2i as this may be contributing to volume loss leading to orthostasis +/- erythrocytosis.

## 2022-06-05 ENCOUNTER — Other Ambulatory Visit (INDEPENDENT_AMBULATORY_CARE_PROVIDER_SITE_OTHER): Payer: Medicare Other

## 2022-06-05 DIAGNOSIS — D751 Secondary polycythemia: Secondary | ICD-10-CM | POA: Diagnosis not present

## 2022-06-05 DIAGNOSIS — D509 Iron deficiency anemia, unspecified: Secondary | ICD-10-CM | POA: Diagnosis not present

## 2022-06-05 DIAGNOSIS — D696 Thrombocytopenia, unspecified: Secondary | ICD-10-CM

## 2022-06-05 NOTE — Addendum Note (Signed)
Addended byLinwood Dibbles on: 06/05/2022 03:35 PM   Modules accepted: Level of Service

## 2022-06-06 LAB — CBC WITH DIFFERENTIAL/PLATELET
Basophils Absolute: 0 10*3/uL (ref 0.0–0.2)
Basos: 1 %
EOS (ABSOLUTE): 0.1 10*3/uL (ref 0.0–0.4)
Eos: 3 %
Hematocrit: 49.9 % (ref 37.5–51.0)
Hemoglobin: 17.3 g/dL (ref 13.0–17.7)
Immature Grans (Abs): 0 10*3/uL (ref 0.0–0.1)
Immature Granulocytes: 0 %
Lymphocytes Absolute: 0.9 10*3/uL (ref 0.7–3.1)
Lymphs: 23 %
MCH: 31.7 pg (ref 26.6–33.0)
MCHC: 34.7 g/dL (ref 31.5–35.7)
MCV: 92 fL (ref 79–97)
Monocytes Absolute: 0.2 10*3/uL (ref 0.1–0.9)
Monocytes: 6 %
Neutrophils Absolute: 2.6 10*3/uL (ref 1.4–7.0)
Neutrophils: 67 %
Platelets: 103 10*3/uL — ABNORMAL LOW (ref 150–450)
RBC: 5.45 x10E6/uL (ref 4.14–5.80)
RDW: 14.4 % (ref 11.6–15.4)
WBC: 3.8 10*3/uL (ref 3.4–10.8)

## 2022-06-06 LAB — IRON AND TIBC
Iron Saturation: 34 % (ref 15–55)
Iron: 96 ug/dL (ref 38–169)
Total Iron Binding Capacity: 286 ug/dL (ref 250–450)
UIBC: 190 ug/dL (ref 111–343)

## 2022-06-06 LAB — FERRITIN: Ferritin: 132 ng/mL (ref 30–400)

## 2022-06-06 NOTE — Progress Notes (Signed)
His hemoglobin levels are back in the normal range. Platelets have dropped again but still above 100. Last blood smear showed normal morphology of the platelets. Will repeat blood smear and check CMP, PT/INR at next office visit. His iron levels has improved. Will continue iron supplementation for additional 3 months to replete his iron stores. Patient has been called and informed of the results.

## 2022-06-10 NOTE — Progress Notes (Signed)
Internal Medicine Clinic Attending  Case and documentation of Dr. Coy Saunas  soon after the resident saw the patient reviewed.  I reviewed the AWV findings.  I agree with the assessment, diagnosis, and plan of care documented in the AWV note.

## 2022-06-11 ENCOUNTER — Other Ambulatory Visit: Payer: Self-pay | Admitting: Internal Medicine

## 2022-06-12 ENCOUNTER — Telehealth: Payer: Self-pay

## 2022-06-12 NOTE — Telephone Encounter (Signed)
PA for pt ( PREGABALIN 75 MG CAP ) came through on cover my meds was submitted   did not require  office notes  or labs at the time .[name] Awaiting approval or denial ...    UPDATE :  Your information has been submitted to [place] [employer] Part D. [employer] Part D will review the request and will issue a decision, typically within 1-3 days from your submission.

## 2022-06-12 NOTE — Progress Notes (Signed)
I have messaged the front desk to call him to schedule the appointment.

## 2022-06-12 NOTE — Telephone Encounter (Signed)
DECISION :    This approval authorizes your coverage from 04/28/2022 - 06/12/2023,    unless we notify you  otherwise, and as long as the following conditions apply: ? you remain enrolled in our Medicare Part D prescription drug plan, ? your physician or other prescriber continues to prescribe the medication for you, and ? the medication continues to be safe for treating your condition.      ( COPY  SENT TO PHARMACY  AND PLACED  TO SCANNED  TO CHART )

## 2022-06-16 ENCOUNTER — Other Ambulatory Visit: Payer: Self-pay | Admitting: Student

## 2022-06-16 NOTE — Telephone Encounter (Signed)
Spoke with patient's wife Keigo Schimpf. Discussed erythrocytosis and she will make [facility] an appointment. Liposuction related to her care, not intended for Mr. Rhodd.

## 2022-06-29 ENCOUNTER — Other Ambulatory Visit: Payer: Self-pay | Admitting: Internal Medicine

## 2022-06-29 ENCOUNTER — Other Ambulatory Visit: Payer: Self-pay | Admitting: Student

## 2022-06-29 DIAGNOSIS — R3915 Urgency of urination: Secondary | ICD-10-CM

## 2022-07-14 ENCOUNTER — Ambulatory Visit: Payer: Medicare Other | Admitting: Cardiovascular Disease

## 2022-07-17 ENCOUNTER — Ambulatory Visit: Payer: Medicare Other | Admitting: Podiatry

## 2022-07-31 NOTE — Progress Notes (Signed)
Cardiology Clinic Note   Date: 08/01/2022 ID: Jonathon [name] Jemmott, DOB [birth year], MRN [id number]  Primary Cardiologist:  Reatha HarpsWesley T O'Neal, MD  Patient Profile    Jonathon [name] Ulibarri is a 72 y.o. male who presents to the clinic today for delayed 1 year follow-up.  Past medical history significant for: PAF. Chronic combined systolic and diastolic heart failure. Echo 06/25/2019: EF 35 to 40%.  Global hypokinesis.  Unable to assess LV diastolic function secondary to A-fib.  Mild LAE.  Trivial MR.  Mild aortic valve sclerosis without stenosis. Echo 05/30/2020: EF 50 to 55%.  Global hypokinesis.  Mild LVH.  Mild BAE.  Mild dilatation of ascending aorta 41 mm. PAD. ABIs 06/23/2019: Severe right lower extremity arterial disease.  No evidence of significant left lower extremity arterial disease. S/p right popliteal superficial femoral and tibial artery embolectomy 06/27/2019. ABI 06/28/2019: No evidence of significant right lower extremity arterial disease, absent waveform in the right great toe.  No evidence of significant left lower extremity arterial disease. 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 DVT. Right lower extremity venous ultrasound 06/08/2019: Nonocclusive thrombus in the distal right femoral vein. Hypertension. Hyperlipidemia. Lipid panel 09/19/2020: LDL 87, HDL 38, TG 121, total 147. CKD stage III. T2DM. GERD.   History of Present Illness    Jonathon [name] Czarnecki first evaluated by Dr. Flora Lipps'Neal on 06/28/2019 for rate control and long-term management of A-fib during hospitalization for lower extremity embolectomy performed by vascular surgery.  Patient reported a 10-year history of A-fib.  Patient continues to be followed by Dr. Flora Lipps'Neal for the above outlined history.  Patient was last seen in the office by Bernadene PersonEmily Monge, NP on 12/17/2021.  He was doing well and no medication changes were made.  Today, patient is accompanied by his wife. Patient denies shortness of breath or dyspnea on exertion. No  chest pain, pressure, or tightness. Denies lower extremity edema, orthopnea, or PND. No palpitations.  He has no cardiac awareness of A-fib.  He denies claudication.  He does not exercise regularly.  No blood in stool or urine or other bleeding concerns.  Patient does take iron so stool is always dark in color.   ROS: All other systems reviewed and are otherwise negative except as noted in History of Present Illness.  Studies Reviewed    ECG personally reviewed by me today: Atrial fibrillation, 75 bpm.  No significant changes from 12/17/2021.  Risk Assessment/Calculations     CHA2DS2-VASc Score = 5   This indicates a 7.2% annual risk of stroke. The patient's score is based upon: CHF History: 1 HTN History: 1 Diabetes History: 1 Stroke History: 0 Vascular Disease History: 1 Age Score: 1 Gender Score: 0             Physical Exam    VS:  BP 104/70   Pulse 78   Ht 6' (1.829 m)   Wt 223 lb (101.2 kg)   SpO2 97%   BMI 30.24 kg/m  , BMI Body mass index is 30.24 kg/m.  GEN: Well nourished, well developed, in no acute distress. Neck: No JVD or carotid bruits. Cardiac: Irregular rhythm, controlled rate. No murmurs. No rubs or gallops.   Respiratory:  Respirations regular and unlabored. Clear to auscultation without rales, wheezing or rhonchi. GI: Soft, nontender, nondistended. Extremities: Radials/DP/PT 2+ and equal bilaterally. No clubbing or cyanosis. No edema.  Skin: Warm and dry, no rash. Neuro: Strength intact.  Assessment & Plan   Persistent A-fib.  Patient with a long history of atrial  fibrillation.  He has no cardiac awareness of A-fib.  He is rate controlled at 75 bpm.  He denies spontaneous bleeding concerns.  Continue metoprolol and Xarelto. Chronic combined systolic and diastolic heart failure.  Echo February 2022 showed EF 50 to 55%, mild LVH mild BAE.  Patient denies DOE, lower extremity edema, orthopnea, or PND.  Euvolemic and well compensated on exam.  Continue  metoprolol and Jardiance. PAD.  S/p right popliteal superficial femoral and tibial artery embolectomy and subsequent right great toe amputation March 2021.  Patient denies claudication.  He does not do much exercise.  He walks with a single-point cane. Hypertension.  BP today 104/70.  Patient denies headaches or dizziness.  Continue metoprolol. Hyperlipidemia.  LDL May 2022 87, not at goal.  Patient has an appointment with PCP next week.  He will have a lipid panel drawn at that time with his other routine labs.  Wife states she will have them send [place] the results.  Continue atorvastatin.  Disposition: Return in 1 year or sooner as needed.        Signed, Etta Grandchild. Brittan Butterbaugh, DNP, NP-C

## 2022-08-01 ENCOUNTER — Encounter: Payer: Self-pay | Admitting: Student

## 2022-08-01 ENCOUNTER — Ambulatory Visit: Payer: Medicare Other | Attending: Cardiovascular Disease | Admitting: Student

## 2022-08-01 VITALS — BP 104/70 | HR 78 | Ht 72.0 in | Wt 223.0 lb

## 2022-08-01 DIAGNOSIS — E785 Hyperlipidemia, unspecified: Secondary | ICD-10-CM

## 2022-08-01 DIAGNOSIS — I4811 Longstanding persistent atrial fibrillation: Secondary | ICD-10-CM | POA: Insufficient documentation

## 2022-08-01 DIAGNOSIS — I10 Essential (primary) hypertension: Secondary | ICD-10-CM | POA: Diagnosis not present

## 2022-08-01 DIAGNOSIS — I739 Peripheral vascular disease, unspecified: Secondary | ICD-10-CM | POA: Diagnosis not present

## 2022-08-01 DIAGNOSIS — I5042 Chronic combined systolic (congestive) and diastolic (congestive) heart failure: Secondary | ICD-10-CM | POA: Diagnosis not present

## 2022-08-01 NOTE — Patient Instructions (Addendum)
Medication Instructions:  Your physician recommends that you continue on your current medications as directed. Please refer to the Current Medication list given to you today.  *If you need a refill on your cardiac medications before your next appointment, please call your pharmacy*   Lab Work: NONE If you have labs (blood work) drawn today and your tests are completely normal, you will receive your results only by: MyChart Message (if you have MyChart) OR A paper copy in the mail If you have any lab test that is abnormal or we need to change your treatment, we will call you to review the results.   Testing/Procedures: NONE   Follow-Up: At [facility], you and your health needs are our priority.  As part of our continuing mission to provide you with exceptional heart care, we have created designated Provider Care Teams.  These Care Teams include your primary Cardiologist (physician) and Advanced Practice Providers (APPs -  Physician Assistants and Nurse Practitioners) who all work together to provide you with the care you need, when you need it.  We recommend signing up for the patient portal called "MyChart".  Sign up information is provided on this After Visit Summary.  MyChart is used to connect with patients for Virtual Visits (Telemedicine).  Patients are able to view lab/test results, encounter notes, upcoming appointments, etc.  Non-urgent messages can be sent to your provider as well.   To learn more about what you can do with MyChart, go to [email or web address].    Your next appointment:   1 year(s)  Provider:   Reatha Harps, MD

## 2022-08-04 ENCOUNTER — Encounter: Payer: Self-pay | Admitting: Student

## 2022-08-04 ENCOUNTER — Ambulatory Visit (INDEPENDENT_AMBULATORY_CARE_PROVIDER_SITE_OTHER): Payer: Medicare Other | Admitting: Student

## 2022-08-04 VITALS — BP 112/78 | HR 64 | Temp 97.5°F | Ht 72.0 in | Wt 226.5 lb

## 2022-08-04 DIAGNOSIS — E785 Hyperlipidemia, unspecified: Secondary | ICD-10-CM

## 2022-08-04 DIAGNOSIS — E7800 Pure hypercholesterolemia, unspecified: Secondary | ICD-10-CM | POA: Diagnosis not present

## 2022-08-04 DIAGNOSIS — M542 Cervicalgia: Secondary | ICD-10-CM

## 2022-08-04 DIAGNOSIS — D696 Thrombocytopenia, unspecified: Secondary | ICD-10-CM | POA: Diagnosis not present

## 2022-08-04 DIAGNOSIS — D751 Secondary polycythemia: Secondary | ICD-10-CM

## 2022-08-04 MED ORDER — MAGNESIUM 250 MG PO TABS: 250.0000 mg | ORAL_TABLET | Freq: Every day | ORAL | 0 refills | Status: DC

## 2022-08-04 MED ORDER — LIDOCAINE 4 % EX PTCH: 1.0000 | MEDICATED_PATCH | CUTANEOUS | 2 refills | Status: DC

## 2022-08-04 NOTE — Assessment & Plan Note (Addendum)
Patient here for follow-up on his recently diagnosed polycythemia. He remains on Jardiance which can cause intravascular volume depletion leading to hemoconcentration/polycythemia. His BP is much improved today with SBP in the 110s. Patient is not hypoxic and not on testosterone therapy. I will repeat CBC check EPO levels today.  Plan: -Repeat CBC with differential -Check erythropoietin levels

## 2022-08-04 NOTE — Progress Notes (Signed)
   CC: Follow-up  HPI:  [name] is a 72 y.o. male with PMH as below who presents to clinic accompanied by spouse to follow-up on his chronic medical problems. Please see problem based charting for evaluation, assessment and plan.  Past Medical History:  Diagnosis Date   Anemia due to blood loss, acute 2014   2nd GIB   Anxiety    CKD (chronic kidney disease), stage III 2014   Diabetes mellitus without complication    Gout    High cholesterol    Hypertension 2014   Hypothyroid    Neuropathy    PUD (peptic ulcer disease) 2014    Review of Systems:  Constitutional: Negative for fever. Positive for occasional fatigue Eyes: Negative for visual changes Respiratory: Negative for shortness of breath Cardiac: Negative for chest pain or palpitations MSK: Positive for occasional neck and upper back pain. Abdomen: Negative for abdominal pain, constipation or diarrhea Neuro: Negative for headache, numbness, tingling or weakness  Physical Exam: General: Pleasant, well-appearing elderly male in a wheelchair.  No acute distress. Cardiac: RRR. No murmurs, rubs or gallops. No LE edema Respiratory: Lungs CTAB. No wheezing or crackles. Abdominal: Soft, symmetric and non tender. Normal BS. Skin: Warm, dry and intact without rashes or lesions MSK: Mild C-spine tenderness. No parasternal muscle tenderness. Normal ROM.  Extremities: Amputated R big toe. 2+ radial pulses, 1+ DP pulses. Neuro: A&O x 3. Moves all extremities. Normal sensation to gross touch. Psych: Appropriate mood and affect.  Vitals:   08/04/22 1420  BP: 112/78  Pulse: 64  Temp: (!) 97.5 F (36.4 C)  TempSrc: Oral  SpO2: 96%  Weight: 226 lb 8 oz (102.7 kg)  Height: 6' (1.829 m)    Assessment & Plan:   Polycythemia Patient here for follow-up on his recently diagnosed polycythemia. He remains on Jardiance which can cause intravascular volume depletion leading to hemoconcentration/polycythemia. His BP is much  improved today with SBP in the 110s. Patient is not hypoxic and not on testosterone therapy. I will repeat CBC check EPO levels today.  Plan: -Repeat CBC with differential -Check erythropoietin levels   Hyperlipidemia Last lipid panel in 2022 showed LDL 87.  He has been adherent to his Lipitor 80 mg daily and fenofibrate 54 mg daily. -Check lipid panel -Continue Lipitor and fenofibrate  Hypomagnesemia Patient with a history of low magnesium on magnesium supplementation. Last magnesium 1.6 in May 2023.  He denies any muscle spasms or heart palpitations. Per spouse, patient takes over-the-counter magnesium supplements. -Check mag  Neck pain Patient reports ongoing neck pain with occasional stiffness.  He also reports arthritis pain in his upper back.  On exam, patient does have some mild tenderness to the C-spine and upper back but no parasternal tenderness.  He has tried physical therapy with some relief but no significant relief with extra strength Tylenol or diclofenac gel.  Patient unable to take oral NSAIDs due to history of GI bleeding and gastritis.  Plan: -Continue as needed extra strength Tylenol -Start lidocaine patch, apply every 24 hours -Advised to try gentle stretching and applying heat at home    See Encounters Tab for problem based charting.  Patient discussed with Dr. Yolande Jolly, MD, MPH

## 2022-08-04 NOTE — Patient Instructions (Addendum)
Thank you, Jonathon [name] for allowing [place] to provide your care today. Today we discussed your A-fib, polycythemia, cholesterol, blood pressure and arthritis.    I have ordered the following labs for you:   Lab Orders         Lipid Profile         CBC with Diff         Erythropoietin         Magnesium      I will call if any are abnormal. All of your labs can be accessed through "My Chart".  I have ordered the following medication/changed the following medications:  Start lidocaine patch, apply 1 patch every 24 hours  My Chart Access: [email or web address]  Please follow-up in 3-6 months  Please make sure to arrive 15 minutes prior to your next appointment. If you arrive late, you may be asked to reschedule.    We look forward to seeing you next time. Please call our clinic at [phone] if you have any questions or concerns. The best time to call is Monday-Friday from 9am-4pm, but there is someone available 24/7. If after hours or the weekend, call the main hospital number and ask for the Internal Medicine Resident On-Call. If you need medication refills, please notify your pharmacy one week in advance and they will send [place] a request.   Thank you for letting us take part in your care. Wishing you the best!  Steffanie Rainwater, MD 08/04/2022, 2:52 PM IM Resident, PGY-3 Jonathon [name] 41:10

## 2022-08-04 NOTE — Assessment & Plan Note (Addendum)
Patient with a history of low magnesium on magnesium supplementation. Last magnesium 1.6 in May 2023.  He denies any muscle spasms or heart palpitations. Per spouse, patient takes over-the-counter magnesium supplements. -Check mag

## 2022-08-04 NOTE — Assessment & Plan Note (Addendum)
Last lipid panel in 2022 showed LDL 87.  He has been adherent to his Lipitor 80 mg daily and fenofibrate 54 mg daily. -Check lipid panel -Continue Lipitor and fenofibrate

## 2022-08-04 NOTE — Assessment & Plan Note (Signed)
Patient reports ongoing neck pain with occasional stiffness.  He also reports arthritis pain in his upper back.  On exam, patient does have some mild tenderness to the C-spine and upper back but no parasternal tenderness.  He has tried physical therapy with some relief but no significant relief with extra strength Tylenol or diclofenac gel.  Patient unable to take oral NSAIDs due to history of GI bleeding and gastritis.  Plan: -Continue as needed extra strength Tylenol -Start lidocaine patch, apply every 24 hours -Advised to try gentle stretching and applying heat at home

## 2022-08-05 ENCOUNTER — Other Ambulatory Visit: Payer: Self-pay | Admitting: Student

## 2022-08-05 ENCOUNTER — Other Ambulatory Visit: Payer: Self-pay | Admitting: Gastroenterology

## 2022-08-05 DIAGNOSIS — K922 Gastrointestinal hemorrhage, unspecified: Secondary | ICD-10-CM

## 2022-08-05 LAB — CBC WITH DIFFERENTIAL/PLATELET
Basophils Absolute: 0 10*3/uL (ref 0.0–0.2)
Basos: 1 %
EOS (ABSOLUTE): 0.1 10*3/uL (ref 0.0–0.4)
Eos: 2 %
Hematocrit: 52.4 % — ABNORMAL HIGH (ref 37.5–51.0)
Hemoglobin: 17.3 g/dL (ref 13.0–17.7)
Immature Grans (Abs): 0 10*3/uL (ref 0.0–0.1)
Immature Granulocytes: 0 %
Lymphocytes Absolute: 0.8 10*3/uL (ref 0.7–3.1)
Lymphs: 17 %
MCH: 31.2 pg (ref 26.6–33.0)
MCHC: 33 g/dL (ref 31.5–35.7)
MCV: 94 fL (ref 79–97)
Monocytes Absolute: 0.3 10*3/uL (ref 0.1–0.9)
Monocytes: 6 %
Neutrophils Absolute: 3.3 10*3/uL (ref 1.4–7.0)
Neutrophils: 74 %
Platelets: 127 10*3/uL — ABNORMAL LOW (ref 150–450)
RBC: 5.55 x10E6/uL (ref 4.14–5.80)
RDW: 14.3 % (ref 11.6–15.4)
WBC: 4.5 10*3/uL (ref 3.4–10.8)

## 2022-08-05 LAB — LIPID PANEL
Chol/HDL Ratio: 3 ratio (ref 0.0–5.0)
Cholesterol, Total: 120 mg/dL (ref 100–199)
HDL: 40 mg/dL (ref >39)
LDL Chol Calc (NIH): 63 mg/dL (ref 0–99)
Triglycerides: 86 mg/dL (ref 0–149)
VLDL Cholesterol Cal: 17 mg/dL (ref 5–40)

## 2022-08-05 LAB — ERYTHROPOIETIN: Erythropoietin: 10.1 m[IU]/mL (ref 2.6–18.5)

## 2022-08-05 LAB — MAGNESIUM: Magnesium: 1.6 mg/dL (ref 1.6–2.3)

## 2022-08-06 ENCOUNTER — Encounter: Payer: Self-pay | Admitting: Gastroenterology

## 2022-08-06 ENCOUNTER — Telehealth: Payer: Self-pay | Admitting: Gastroenterology

## 2022-08-06 NOTE — Addendum Note (Signed)
Addended by: Dorris Fetch on: 08/06/2022 02:19 PM   Modules accepted: Orders

## 2022-08-06 NOTE — Telephone Encounter (Signed)
I spoke to Philips wife Azad Erler, I made her aware of the new treatment plan. She states that Ki[place] will be happy to hear he can come off the Iron supplement.

## 2022-08-06 NOTE — Telephone Encounter (Signed)
Jonathon [name],    I rec'd a refill request for this patient's iron sulfate.  His Hgb returned to normal over 6 months ago and has actually been high lately. Iron levels were normal in February.  So if he is still taking iron tablets, he should stop.  I copied this to his PCP that just saw him on 08/04/22 so they will be aware and can plan to check his blood counts in a few months at their discretion.  - H. Danis   Dr. Kirke Corin,   Please see above on your clinic patient.  - HD

## 2022-08-06 NOTE — Progress Notes (Signed)
Internal Medicine Clinic Attending  Case discussed with Dr. Amponsah  at the time of the visit.  We reviewed the resident's history and exam and pertinent patient test results.  I agree with the assessment, diagnosis, and plan of care documented in the resident's note.  

## 2022-08-07 NOTE — Telephone Encounter (Signed)
Thank you for the update. He has relative polycythemia that is likely due to the iron increasing RBC mass and the Jardiance causing intravascular depletion. I have asked his cardiologist to check a CBC when he sees him next month now that he is off the iron supplements.

## 2022-08-07 NOTE — Progress Notes (Signed)
CBC shows his hemoglobin is still elevated at 17.3 with slight increase in hematocrit, but his platelet is trending back up. 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 He was on iron supplement that was started by his GI doc which was likely increasing his RBC mass. He is also on Jardiance which can cause intravascular depletion leading to relative polycythemia. His GI doctor is taking him off the iron supplements.  I have asked cardiology to check a CBC when they see him next month and we will consider holding the Jardiance if his hgb/hct are not within the normal range. I have also asked his wife to make sure he is drinking plenty of water daily.  His LDL is now at goal.  His magnesium is still low so he will continue the magnesium supplements.

## 2022-08-15 ENCOUNTER — Other Ambulatory Visit: Payer: Self-pay | Admitting: Internal Medicine

## 2022-08-18 ENCOUNTER — Ambulatory Visit: Payer: Medicare Other | Admitting: Cardiovascular Disease

## 2022-08-27 ENCOUNTER — Ambulatory Visit: Payer: Medicare Other | Admitting: Cardiovascular Disease

## 2022-09-04 DIAGNOSIS — H353122 Nonexudative age-related macular degeneration, left eye, intermediate dry stage: Secondary | ICD-10-CM | POA: Diagnosis not present

## 2022-09-04 DIAGNOSIS — E113293 Type 2 diabetes mellitus with mild nonproliferative diabetic retinopathy without macular edema, bilateral: Secondary | ICD-10-CM | POA: Diagnosis not present

## 2022-09-04 DIAGNOSIS — H353112 Nonexudative age-related macular degeneration, right eye, intermediate dry stage: Secondary | ICD-10-CM | POA: Diagnosis not present

## 2022-09-04 DIAGNOSIS — H2513 Age-related nuclear cataract, bilateral: Secondary | ICD-10-CM | POA: Diagnosis not present

## 2022-09-04 LAB — HM DIABETES EYE EXAM

## 2022-09-14 NOTE — Progress Notes (Deleted)
Cardiology Office Note:   Date:  09/14/2022  NAME:  Jonathon [name]    MRN: [id number] DOB:  Nov 03, 1950   PCP:  Modena Slater, DO  Cardiologist:  Reatha Harps, MD  Electrophysiologist:  None   Referring MD: Modena Slater, DO   No chief complaint on file. ***  History of Present Illness:   Jonathon [name] is a 72 y.o. male with a hx of persistent Afib, systolic HF, DM, HLD who presents for follow-up.   Problem List 1. Persistent atrial fibrillation  -CHADSVASC=4 (age, DM, CHF, PAD) 2. Systolic HF -06/25/2019 35-40% -05/30/2020 50-55% 3. PAD -RLE arterial thromboembolic event 4. DVT 5. Diabetes  -A1c 5.1 6.  Hyperlipidemia -Total cholesterol 145, HDL 41, LDL 86, triglycerides 96  Past Medical History: Past Medical History:  Diagnosis Date   Anemia due to blood loss, acute 2014   2nd GIB   Anxiety    CKD (chronic kidney disease), stage III (HCC) 2014   Diabetes mellitus without complication (HCC)    Gout    High cholesterol    Hypertension 2014   Hypothyroid    Neuropathy    PUD (peptic ulcer disease) 2014    Past Surgical History: Past Surgical History:  Procedure Laterality Date   ABDOMINAL AORTOGRAM W/LOWER EXTREMITY Bilateral 06/27/2019   Procedure: ABDOMINAL AORTOGRAM W/LOWER EXTREMITY;  Surgeon: Sherren Kerns, MD;  [facility];  Service: Vascular;  Laterality: Bilateral;   ADRENALECTOMY     AMPUTATION Right 07/18/2019   Procedure: AMPUTATION RIGHT GREAT TOE;  Surgeon: Cephus Shelling, MD;  Location: [facility] OR;  Service: Vascular;  Laterality: Right;   BIOPSY  09/09/2021   Procedure: BIOPSY;  Surgeon: Shellia Cleverly, DO;  Location: [facility];  Service: Gastroenterology;;   COLONOSCOPY WITH PROPOFOL N/A 09/09/2021   Procedure: COLONOSCOPY WITH PROPOFOL;  Surgeon: Shellia Cleverly, DO;  [facility]: [facility];  Service: Gastroenterology;  Laterality: N/A;   COLONOSCOPY WITH PROPOFOL N/A 09/20/2021   Procedure: COLONOSCOPY WITH  PROPOFOL;  Surgeon: Napoleon Form, MD;  [facility];  Service: Gastroenterology;  Laterality: N/A;   EMBOLECTOMY Right 06/27/2019   Procedure: RIGHT LEG EMBOLECTOMY;  Surgeon: Sherren Kerns, MD;  Location: [facility];  Service: Vascular;  Laterality: Right;   ESOPHAGOGASTRODUODENOSCOPY (EGD) WITH PROPOFOL N/A 09/09/2021   Procedure: ESOPHAGOGASTRODUODENOSCOPY (EGD) WITH PROPOFOL;  Surgeon: Shellia Cleverly, DO;  [facility];  Service: Gastroenterology;  Laterality: N/A;   HEMOSTASIS CLIP PLACEMENT  09/20/2021   Procedure: HEMOSTASIS CLIP PLACEMENT;  Surgeon: Napoleon Form, MD;  [facility];  Service: Gastroenterology;;   POLYPECTOMY  09/09/2021   Procedure: POLYPECTOMY;  Surgeon: Shellia Cleverly, DO;  [facility];  Service: Gastroenterology;;  EGD and COLON    Current Medications: No outpatient medications have been marked as taking for the 09/18/22 encounter (Appointment) with Sande Rives, MD.     Allergies:    Morphine and codeine   Social History: Social History   Socioeconomic History   Marital status: Married    Spouse name: Not on file   Number of children: Not on file   Years of education: Not on file   Highest education level: Not on file  Occupational History   Occupation: banking    Comment: retired  Tobacco Use   Smoking status: Former    Packs/day: 2.00    Years: 3.00    Additional pack years: 0.00    Total pack years: 6.00    Types:  Cigarettes   Smokeless tobacco: Former    Quit date: 05/08/1967  Vaping Use   Vaping Use: Never used  Substance and Sexual Activity   Alcohol use: Yes    Alcohol/week: 14.0 standard drinks of alcohol    Types: 14 Shots of liquor per week    Comment: daily   Drug use: Yes    Types: Marijuana   Sexual activity: Not on file  Other Topics Concern   Not on file  Social History Narrative   Not on file   Social Determinants of Health   Financial Resource Strain:  Low Risk  (05/30/2022)   Overall Financial Resource Strain (CARDIA)    Difficulty of Paying Living Expenses: Not hard at all  Food Insecurity: No Food Insecurity (05/30/2022)   Hunger Vital Sign    Worried About Running Out of Food in the Last Year: Never true    Ran Out of Food in the Last Year: Never true  Transportation Needs: No Transportation Needs (05/30/2022)   PRAPARE - Administrator, Civil Service (Medical): No    Lack of Transportation (Non-Medical): No  Physical Activity: Inactive (05/30/2022)   Exercise Vital Sign    Days of Exercise per Week: 0 days    Minutes of Exercise per Session: 0 min  Stress: No Stress Concern Present (05/30/2022)   [employer] of Occupational Health - Occupational Stress Questionnaire    Feeling of Stress : Not at all  Social Connections: Moderately Isolated (05/30/2022)   Social Connection and Isolation Panel [NHANES]    Frequency of Communication with Friends and Family: Twice a week    Frequency of Social Gatherings with Friends and Family: Once a week    Attends Religious Services: Never    Database administrator or Organizations: No    Attends Engineer, structural: Never    Marital Status: Married     Family History: The patient's ***family history includes Diabetes Mellitus II in his maternal aunt; Lung cancer in his mother; Pancreatic cancer in his sister.  ROS:   All other ROS reviewed and negative. Pertinent positives noted in the HPI.     EKGs/Labs/Other Studies Reviewed:   The following studies were personally reviewed by me today:  EKG:  EKG is *** ordered today.  The ekg ordered today demonstrates ***, and was personally reviewed by me.   Recent Labs: 09/19/2021: ALT 16 09/21/2021: BUN 7; Creatinine, Ser 1.21; Potassium 3.5; Sodium 139 05/29/2022: TSH 1.750 08/04/2022: Hemoglobin 17.3; Magnesium 1.6; Platelets 127   Recent Lipid Panel    Component Value Date/Time   CHOL 120 08/04/2022 1459   TRIG 86  08/04/2022 1459   HDL 40 08/04/2022 1459   CHOLHDL 3.0 08/04/2022 1459   LDLCALC 63 08/04/2022 1459    Physical Exam:   VS:  There were no vitals taken for this visit.   Wt Readings from Last 3 Encounters:  08/04/22 226 lb 8 oz (102.7 kg)  08/01/22 223 lb (101.2 kg)  05/30/22 227 lb (103 kg)    General: Well nourished, well developed, in no acute distress Head: Atraumatic, normal size  Eyes: PEERLA, EOMI  Neck: Supple, no JVD Endocrine: No thryomegaly Cardiac: Normal S1, S2; RRR; no murmurs, rubs, or gallops Lungs: Clear to auscultation bilaterally, no wheezing, rhonchi or rales  Abd: Soft, nontender, no hepatomegaly  Ext: No edema, pulses 2+ Musculoskeletal: No deformities, BUE and BLE strength normal and equal Skin: Warm and dry, no rashes   Neuro:  Alert and oriented to person, place, time, and situation, CNII-XII grossly intact, no focal deficits  Psych: Normal mood and affect   ASSESSMENT:   Jonathon [name] is a 72 y.o. male who presents for the following: No diagnosis found.  PLAN:   There are no diagnoses linked to this encounter.  {Are you ordering a CV Procedure (e.g. stress test, cath, DCCV, TEE, etc)?   Press F2        :[id number]}  Disposition: No follow-ups on file.  Medication Adjustments/Labs and Tests Ordered: Current medicines are reviewed at length with the patient today.  Concerns regarding medicines are outlined above.  No orders of the defined types were placed in this encounter.  No orders of the defined types were placed in this encounter.   There are no Patient Instructions on file for this visit.   Time Spent with Patient: I have spent a total of *** minutes with patient reviewing hospital notes, telemetry, EKGs, labs and examining the patient as well as establishing an assessment and plan that was discussed with the patient.  > 50% of time was spent in direct patient care.  Signed, Lenna Gilford. Flora Lipps, MD, [facility]  [facility]   [address], [address] [place], [place] [address] [phone]  09/14/2022 4:19 PM

## 2022-09-15 ENCOUNTER — Telehealth: Payer: Self-pay

## 2022-09-15 NOTE — Telephone Encounter (Signed)
Spoke to patient's wife about 5/23 appointment with Dr.O'Neal.Advised patient just saw PA.Stated she already cancelled appointment.

## 2022-09-18 ENCOUNTER — Ambulatory Visit: Payer: Medicare Other | Admitting: Cardiovascular Disease

## 2022-09-18 DIAGNOSIS — I739 Peripheral vascular disease, unspecified: Secondary | ICD-10-CM

## 2022-09-18 DIAGNOSIS — I10 Essential (primary) hypertension: Secondary | ICD-10-CM

## 2022-09-18 DIAGNOSIS — I4811 Longstanding persistent atrial fibrillation: Secondary | ICD-10-CM

## 2022-09-18 DIAGNOSIS — E785 Hyperlipidemia, unspecified: Secondary | ICD-10-CM

## 2022-09-18 DIAGNOSIS — I5042 Chronic combined systolic (congestive) and diastolic (congestive) heart failure: Secondary | ICD-10-CM

## 2022-09-23 ENCOUNTER — Other Ambulatory Visit: Payer: Self-pay | Admitting: Gastroenterology

## 2022-09-24 ENCOUNTER — Other Ambulatory Visit (HOSPITAL_COMMUNITY): Payer: Self-pay

## 2022-09-24 ENCOUNTER — Other Ambulatory Visit: Payer: Self-pay

## 2022-09-24 MED ORDER — FERROUS SULFATE 325 (65 FE) MG PO TABS: 325.0000 mg | ORAL_TABLET | ORAL | 2 refills | Status: DC

## 2022-09-25 ENCOUNTER — Other Ambulatory Visit (HOSPITAL_COMMUNITY): Payer: Self-pay

## 2022-09-29 ENCOUNTER — Other Ambulatory Visit: Payer: Self-pay | Admitting: Internal Medicine

## 2022-09-29 DIAGNOSIS — I10 Essential (primary) hypertension: Secondary | ICD-10-CM

## 2022-10-22 ENCOUNTER — Encounter: Payer: Self-pay | Admitting: Dietician

## 2022-10-26 ENCOUNTER — Other Ambulatory Visit: Payer: Self-pay | Admitting: Student

## 2022-10-27 ENCOUNTER — Other Ambulatory Visit: Payer: Self-pay

## 2022-10-27 DIAGNOSIS — F321 Major depressive disorder, single episode, moderate: Secondary | ICD-10-CM

## 2022-10-28 MED ORDER — SERTRALINE HCL 100 MG PO TABS: 100.0000 mg | ORAL_TABLET | Freq: Every day | ORAL | 4 refills | Status: DC

## 2022-11-19 ENCOUNTER — Other Ambulatory Visit: Payer: Self-pay

## 2022-11-19 ENCOUNTER — Encounter: Payer: Self-pay | Admitting: Student

## 2022-11-19 ENCOUNTER — Ambulatory Visit (INDEPENDENT_AMBULATORY_CARE_PROVIDER_SITE_OTHER): Payer: Medicare Other | Admitting: Student

## 2022-11-19 VITALS — BP 152/99 | HR 63 | Temp 97.7°F | Ht 72.0 in | Wt 227.7 lb

## 2022-11-19 DIAGNOSIS — E1122 Type 2 diabetes mellitus with diabetic chronic kidney disease: Secondary | ICD-10-CM

## 2022-11-19 DIAGNOSIS — N3941 Urge incontinence: Secondary | ICD-10-CM | POA: Diagnosis not present

## 2022-11-19 MED ORDER — ALBUTEROL SULFATE HFA 108 (90 BASE) MCG/ACT IN AERS: 2.0000 | INHALATION_SPRAY | Freq: Four times a day (QID) | RESPIRATORY_TRACT | 2 refills | Status: DC | PRN

## 2022-11-19 NOTE — Assessment & Plan Note (Addendum)
Patient presents with concerns of persistent urinary incontinence for over a year, reports he was giving finasteride and  tamsulosin but has not been effective at this time.  States its been affecting his quality of life and needs further assessment of his incontinence. -Ambulatory referral to urologist -Follow-up in 6 months

## 2022-11-19 NOTE — Progress Notes (Signed)
CC: Persistent urinary incontinence  HPI:  [name] is a 72 y.o. male living with a history stated below and presents today for persistent urinary incontinence. Please see problem based assessment and plan for additional details.  Past Medical History:  Diagnosis Date   Anemia due to blood loss, acute 2014   2nd GIB   Anxiety    CKD (chronic kidney disease), stage III (HCC) 2014   Diabetes mellitus without complication (HCC)    Gout    High cholesterol    Hypertension 2014   Hypothyroid    Neuropathy    PUD (peptic ulcer disease) 2014    Current Outpatient Medications on File Prior to Visit  Medication Sig Dispense Refill   aspirin EC 81 MG EC tablet Take 1 tablet (81 mg total) by mouth daily.     atorvastatin (LIPITOR) 80 MG tablet TAKE 1 TABLET BY MOUTH EVERYDAY AT BEDTIME 90 tablet 3   diclofenac Sodium (VOLTAREN) 1 % GEL Apply 2 g topically 4 (four) times daily. 2 g 3   fenofibrate 54 MG tablet TAKE 1 TABLET(54 MG) BY MOUTH DAILY 90 tablet 1   ferrous sulfate 325 (65 FE) MG tablet Take 1 tablet (325 mg total) by mouth every other day. 90 tablet 2   finasteride (PROSCAR) 5 MG tablet Take 1 tablet (5 mg total) by mouth daily. 30 tablet 11   fluticasone (FLONASE) 50 MCG/ACT nasal spray SPRAY 1 SPRAY INTO BOTH NOSTRILS DAILY. 16 mL 3   JARDIANCE 10 MG TABS tablet TAKE 1 TABLET BY MOUTH EVERY DAY 90 tablet 1   levothyroxine (SYNTHROID) 112 MCG tablet Take 1 tablet (112 mcg total) by mouth at bedtime. 90 tablet 3   lidocaine (HM LIDOCAINE PATCH) 4 % Place 1 patch onto the skin daily. 30 patch 2   Magnesium 250 MG TABS Take 1 tablet (250 mg total) by mouth daily. 30 tablet 0   metoprolol succinate (TOPROL-XL) 25 MG 24 hr tablet TAKE 1 TABLET (25 MG TOTAL) BY MOUTH DAILY. 90 tablet 3   pantoprazole (PROTONIX) 40 MG tablet TAKE 1 TABLET BY MOUTH EVERY DAY 90 tablet 1   pregabalin (LYRICA) 75 MG capsule TAKE 2 CAPSULES (150 MG TOTAL) BY MOUTH AT BEDTIME. 180 capsule 2    rivaroxaban (XARELTO) 20 MG TABS tablet Take 1 tablet (20 mg total) by mouth daily. 90 tablet 3   sertraline (ZOLOFT) 100 MG tablet Take 1 tablet (100 mg total) by mouth daily. 90 tablet 4   tamsulosin (FLOMAX) 0.4 MG CAPS capsule TAKE 1 CAPSULE BY MOUTH EVERY DAY 90 capsule 1   traZODone (DESYREL) 100 MG tablet TAKE 1 TABLET BY MOUTH EVERYDAY AT BEDTIME 90 tablet 1   No current facility-administered medications on file prior to visit.    Family History  Problem Relation Age of Onset   Diabetes Mellitus II Maternal Aunt    Lung cancer Mother    Pancreatic cancer Sister     Social History   Socioeconomic History   Marital status: Married    Spouse name: Not on file   Number of children: Not on file   Years of education: Not on file   Highest education level: Not on file  Occupational History   Occupation: banking    Comment: retired  Tobacco Use   Smoking status: Former    Current packs/day: 2.00    Average packs/day: 2.0 packs/day for 3.0 years (6.0 ttl pk-yrs)    Types: Cigarettes   Smokeless tobacco:  Former    Quit date: 05/08/1967  Vaping Use   Vaping status: Never Used  Substance and Sexual Activity   Alcohol use: Yes    Alcohol/week: 14.0 standard drinks of alcohol    Types: 14 Shots of liquor per week    Comment: daily   Drug use: Yes    Types: Marijuana   Sexual activity: Not on file  Other Topics Concern   Not on file  Social History Narrative   Not on file   Social Determinants of Health   Financial Resource Strain: Low Risk  (05/30/2022)   Overall Financial Resource Strain (CARDIA)    Difficulty of Paying Living Expenses: Not hard at all  Food Insecurity: No Food Insecurity (05/30/2022)   Hunger Vital Sign    Worried About Running Out of Food in the Last Year: Never true    Ran Out of Food in the Last Year: Never true  Transportation Needs: No Transportation Needs (05/30/2022)   PRAPARE - Administrator, Civil Service (Medical): No    Lack of  Transportation (Non-Medical): No  Physical Activity: Inactive (05/30/2022)   Exercise Vital Sign    Days of Exercise per Week: 0 days    Minutes of Exercise per Session: 0 min  Stress: No Stress Concern Present (05/30/2022)   [employer] of Occupational Health - Occupational Stress Questionnaire    Feeling of Stress : Not at all  Social Connections: Moderately Isolated (05/30/2022)   Social Connection and Isolation Panel [NHANES]    Frequency of Communication with Friends and Family: Twice a week    Frequency of Social Gatherings with Friends and Family: Once a week    Attends Religious Services: Never    Database administrator or Organizations: No    Attends Banker Meetings: Never    Marital Status: Married  Catering manager Violence: Not At Risk (05/30/2022)   Humiliation, Afraid, Rape, and Kick questionnaire    Fear of Current or Ex-Partner: No    Emotionally Abused: No    Physically Abused: No    Sexually Abused: No    Review of Systems: ROS negative except for what is noted on the assessment and plan.  Vitals:   11/19/22 1555  BP: (!) 152/99  Pulse: 63  Temp: 97.7 F (36.5 C)  TempSrc: Oral  SpO2: 96%  Weight: 227 lb 11.2 oz (103.3 kg)  Height: 6' (1.829 m)    Physical Exam: Constitutional: Obese appearing man sitting in a wheelchair HENT: normocephalic atraumatic, mucous membranes moist Eyes: conjunctiva non-erythematous Cardiovascular: regular rate and rhythm, no m/r/g Pulmonary/Chest:  MSK: Scar tissue on his right leg consistent with previous stents placement    Assessment & Plan:   Urge incontinence Patient presents with concerns of persistent urinary incontinence for over a year, reports he was giving finasteride and  tamsulosin but has not been effective at this time.  States its been affecting his quality of life and needs further assessment of his incontinence. -Ambulatory referral to urologist -Follow-up in 6 months     Patient seen  with Dr. Lavonna Monarch, M.D [facility] Internal Medicine Phone: [phone] Date 11/19/2022 Time 5:00 PM

## 2022-11-19 NOTE — Patient Instructions (Signed)
Thank you, [name] for allowing [place] to provide your care today. Today we discussed annual health and persistent urinary incontinence.  Anesthesia referral to a urologist.  Give you a call to set up an appointment.  Please let us know if you have any other concerns  I have ordered the following labs for you:  Lab Orders  No laboratory test(s) ordered today     Tests ordered today:    Referrals ordered today:   Referral Orders         Ambulatory referral to Urology       I have ordered the following medication/changed the following medications:   Stop the following medications: Medications Discontinued During This Encounter  Medication Reason   albuterol (VENTOLIN HFA) 108 (90 Base) MCG/ACT inhaler Reorder     Start the following medications: Meds ordered this encounter  Medications   albuterol (VENTOLIN HFA) 108 (90 Base) MCG/ACT inhaler    Sig: Inhale 2 puffs into the lungs every 6 (six) hours as needed for wheezing or shortness of breath.    Dispense:  8 g    Refill:  2     Follow up: 6 months   Remember:   Should you have any questions or concerns please call the internal medicine clinic at [phone].    Jonathon [name], M.D [facility]

## 2022-11-19 NOTE — Assessment & Plan Note (Signed)
History of type 2 diabetes, currently on Jardiance. Last Hgb A1C is 5.7.  Patient will return to clinic after her urology appointment.  Will recheck A1c at that time. -Continue Jardiance 10 mg

## 2022-11-22 NOTE — Progress Notes (Signed)
Internal Medicine Clinic Attending  I was physically present during the key portions of the resident provided service and participated in the medical decision making of patient's management care. I reviewed pertinent patient test results.  The assessment, diagnosis, and plan were formulated together and I agree with the documentation in the resident's note.  Note uncontrolled BP today, not addressed this year.  Please f/u at next visit.    Miguel Aschoff, MD

## 2022-11-28 ENCOUNTER — Ambulatory Visit (INDEPENDENT_AMBULATORY_CARE_PROVIDER_SITE_OTHER): Payer: Medicare Other | Admitting: Urology

## 2022-11-28 ENCOUNTER — Encounter: Payer: Self-pay | Admitting: Urology

## 2022-11-28 VITALS — BP 133/91 | HR 102 | Ht 72.0 in | Wt 227.0 lb

## 2022-11-28 DIAGNOSIS — N401 Enlarged prostate with lower urinary tract symptoms: Secondary | ICD-10-CM | POA: Diagnosis not present

## 2022-11-28 DIAGNOSIS — N3281 Overactive bladder: Secondary | ICD-10-CM | POA: Diagnosis not present

## 2022-11-28 DIAGNOSIS — N3941 Urge incontinence: Secondary | ICD-10-CM | POA: Diagnosis not present

## 2022-11-28 LAB — BLADDER SCAN AMB NON-IMAGING

## 2022-11-28 MED ORDER — GEMTESA 75 MG PO TABS: 75.0000 mg | ORAL_TABLET | Freq: Every day | ORAL | 5 refills | Status: DC

## 2022-11-28 NOTE — Progress Notes (Addendum)
Assessment: 1. Benign localized prostatic hyperplasia with lower urinary tract symptoms (LUTS)   2. OAB (overactive bladder)      Plan: Today I had a long discussion with the patient and his wife regarding management options for his lower urinary tract symptoms. The first thing I discussed with him is that I think [name] is a poor choice of medication for this gentleman who is bothered significantly by frequency urgency and urge incontinence.  There are many other diabetic medical choices that are not known to worsen the symptoms.  I have encouraged him to discuss that with his diabetes doctor.  We also discussed additional medical management management options.  I have recommended a formal bowel regimen with daily stool softeners and laxative as needed as this could be contributory  Patient would like to try Gemtesa.  I think this is the best choice to avoid significant drug interactions, exacerbate his problem with constipation and hard stools and to avoid any cognitive issues. Samples of Gemtesa and prescription provided for trial.  Ashby Dawes of medication including proper utilization as well as potential adverse events and side effects reviewed. Follow-up 3 months for recheck with residual urine determination and symptom assessment  Chief Complaint: Urinary urgency and urge incontinence  History of Present Illness:  Jonathon [name] is a 72 y.o. male with multiple serious medical comorbidities who is seen in consultation from Dr. Charissa Bash for evaluation regarding lower urinary tract symptoms and specifically bothersome urgency and urge incontinence. This is a longstanding problem dating back at least 6 years.  The patient previously was under the care of Dr. Dorise Hiss at [facility].  He underwent full evaluation including cystoscopy at that time that was negative.  He has been on tamsulosin since that time and for the past year has also been on finasteride. Patient was also  evaluated in 2021 by Dr. Vernie Ammons at [facility] for gross hematuria and underwent CT and cystoscopy which was negative.  PVR= 2ml  The patient has current AUA symptom score of 12 but is predominantly frequency urgency and urge incontinence.  He does not wear pads but changes his underwear frequently during the day.  Patient also reports being bothered by some degree of constipation and hard stools.    Past Medical History:  Past Medical History:  Diagnosis Date   Anemia due to blood loss, acute 2014   2nd GIB   Anxiety    CKD (chronic kidney disease), stage III (HCC) 2014   Diabetes mellitus without complication (HCC)    Gout    High cholesterol    Hypertension 2014   Hypothyroid    Neuropathy    PUD (peptic ulcer disease) 2014    Past Surgical History:  Past Surgical History:  Procedure Laterality Date   ABDOMINAL AORTOGRAM W/LOWER EXTREMITY Bilateral 06/27/2019   Procedure: ABDOMINAL AORTOGRAM W/LOWER EXTREMITY;  Surgeon: Sherren Kerns, MD;  [facility];  Service: Vascular;  Laterality: Bilateral;   ADRENALECTOMY     AMPUTATION Right 07/18/2019   Procedure: AMPUTATION RIGHT GREAT TOE;  Surgeon: Cephus Shelling, MD;  Location: [facility] OR;  Service: Vascular;  Laterality: Right;   BIOPSY  09/09/2021   Procedure: BIOPSY;  Surgeon: Shellia Cleverly, DO;  [facility]: [facility];  Service: Gastroenterology;;   COLONOSCOPY WITH PROPOFOL N/A 09/09/2021   Procedure: COLONOSCOPY WITH PROPOFOL;  Surgeon: Shellia Cleverly, DO;  Location: [facility];  Service: Gastroenterology;  Laterality: N/A;   COLONOSCOPY WITH PROPOFOL N/A 09/20/2021  Procedure: COLONOSCOPY WITH PROPOFOL;  Surgeon: Napoleon Form, MD;  Location: [facility] ENDOSCOPY;  Service: Gastroenterology;  Laterality: N/A;   EMBOLECTOMY Right 06/27/2019   Procedure: RIGHT LEG EMBOLECTOMY;  Surgeon: Sherren Kerns, MD;  Location: [facility] OR;  Service: Vascular;  Laterality: Right;   ESOPHAGOGASTRODUODENOSCOPY (EGD) WITH  PROPOFOL N/A 09/09/2021   Procedure: ESOPHAGOGASTRODUODENOSCOPY (EGD) WITH PROPOFOL;  Surgeon: Shellia Cleverly, DO;  [facility]: [facility];  Service: Gastroenterology;  Laterality: N/A;   HEMOSTASIS CLIP PLACEMENT  09/20/2021   Procedure: HEMOSTASIS CLIP PLACEMENT;  Surgeon: Napoleon Form, MD;  [facility]: [facility];  Service: Gastroenterology;;   POLYPECTOMY  09/09/2021   Procedure: POLYPECTOMY;  Surgeon: Shellia Cleverly, DO;  Location: [facility];  Service: Gastroenterology;;  EGD and COLON    Allergies:  Allergies  Allergen Reactions   Morphine And Codeine Itching    Family History:  Family History  Problem Relation Age of Onset   Diabetes Mellitus II Maternal Aunt    Lung cancer Mother    Pancreatic cancer Sister     Social History:  Social History   Tobacco Use   Smoking status: Former    Current packs/day: 2.00    Average packs/day: 2.0 packs/day for 3.0 years (6.0 ttl pk-yrs)    Types: Cigarettes   Smokeless tobacco: Former    Quit date: 05/08/1967  Vaping Use   Vaping status: Never Used  Substance Use Topics   Alcohol use: Yes    Alcohol/week: 14.0 standard drinks of alcohol    Types: 14 Shots of liquor per week    Comment: daily   Drug use: Yes    Types: Marijuana    Review of symptoms:  Constitutional:  Negative for unexplained weight loss, night sweats, fever, chills ENT:  Negative for nose bleeds, sinus pain, painful swallowing CV:  Negative for chest pain, shortness of breath, exercise intolerance, palpitations, loss of consciousness Resp:  Negative for cough, wheezing, shortness of breath GI:  Negative for nausea, vomiting, diarrhea, bloody stools GU:  Positives noted in HPI; otherwise negative for gross hematuria, dysuria, urinary incontinence Neuro:  Negative for seizures, poor balance, limb weakness, slurred speech Psych:  Negative for lack of energy, depression, anxiety Endocrine:  Negative for polydipsia, polyuria, symptoms of  hypoglycemia (dizziness, hunger, sweating) Hematologic:  Negative for anemia, purpura, petechia, prolonged or excessive bleeding, use of anticoagulants  Allergic:  Negative for difficulty breathing or choking as a result of exposure to anything; no shellfish allergy; no allergic response (rash/itch) to materials, foods  Physical exam: BP (!) 133/91   Pulse (!) 102   Ht 6' (1.829 m)   Wt 227 lb (103 kg)   BMI 30.79 kg/m  GENERAL APPEARANCE:  Well appearing, well developed, well nourished, NAD  GU: Normal external genitalia DRE: Normal sphincter tone; prostate is flat without nodules or induration.  Approximately 30 g.  Results: Results for orders placed or performed in visit on 11/28/22 (from the past 24 hour(s))  BLADDER SCAN AMB NON-IMAGING   Collection Time: 11/28/22 12:00 AM  Result Value Ref Range   Scan Result 2ml

## 2022-11-28 NOTE — Addendum Note (Signed)
Addended by: Carolin Coy on: 11/28/2022 11:52 AM   Modules accepted: Orders

## 2022-12-01 ENCOUNTER — Telehealth: Payer: Self-pay

## 2022-12-01 NOTE — Telephone Encounter (Signed)
Patients spouse Thurston Hole called regarding discontinuing Landry Dyke stated the urologist told her that may be a reason for his urinary incontinence. Please return Anne's call [phone]

## 2022-12-02 DIAGNOSIS — H18413 Arcus senilis, bilateral: Secondary | ICD-10-CM | POA: Diagnosis not present

## 2022-12-02 DIAGNOSIS — H25043 Posterior subcapsular polar age-related cataract, bilateral: Secondary | ICD-10-CM | POA: Diagnosis not present

## 2022-12-02 DIAGNOSIS — H2513 Age-related nuclear cataract, bilateral: Secondary | ICD-10-CM | POA: Diagnosis not present

## 2022-12-02 DIAGNOSIS — H353132 Nonexudative age-related macular degeneration, bilateral, intermediate dry stage: Secondary | ICD-10-CM | POA: Diagnosis not present

## 2022-12-02 DIAGNOSIS — H2511 Age-related nuclear cataract, right eye: Secondary | ICD-10-CM | POA: Diagnosis not present

## 2022-12-02 NOTE — Telephone Encounter (Signed)
I called pt back and made the appt for 8/15 with Dr Justin Mend @ 2 PM

## 2022-12-09 ENCOUNTER — Telehealth: Payer: Self-pay

## 2022-12-09 DIAGNOSIS — U071 COVID-19: Secondary | ICD-10-CM | POA: Diagnosis not present

## 2022-12-09 NOTE — Telephone Encounter (Signed)
Patients spouse Thurston Hole called she stated her and her husband has tested positive for covid, she wants to know if she should take the patient to the urgent care to receive medications for treatment, or if his pcp can send him in anything. Please return Anne's call [phone]

## 2022-12-11 ENCOUNTER — Ambulatory Visit (INDEPENDENT_AMBULATORY_CARE_PROVIDER_SITE_OTHER): Payer: Medicare Other | Admitting: Student

## 2022-12-11 DIAGNOSIS — U071 COVID-19: Secondary | ICD-10-CM

## 2022-12-11 DIAGNOSIS — N3941 Urge incontinence: Secondary | ICD-10-CM | POA: Diagnosis not present

## 2022-12-11 NOTE — Progress Notes (Signed)
Established Patient Office Visit  Subjective   Patient ID: Jonathon [name], male    DOB: 02-13-1951  Age: 72 y.o. MRN: [id number]  No chief complaint on file.   Patient is a 72 y.o. with a past medical history stated below who presents today for a telehealth follow-up visit for recent COVID infection and urinary incontinence. Patient's identity was confirmed using DOB, his wife Jonathon [name] was also on the call with patient's permission. Please see problem based assessment and plan for additional details.    Per patient, his wife tested positive for COVID on Sunday 8/11 and patient tested positive on Monday 8/12. Both presented to urgent care and were given a 5 day course of Paxlovid and Promethazine DM syrup PRN which they started Monday 8/12. Patient reports overall improvement in symptoms, with persistent head congestion that makes it difficult to breath at times. He uses nasal spray which helps. Patient and his wife completed day 4 Paxlovid today.   Patient recently seen in clinic on 7/24 for complaints of worsening urinary incontinence. Patient referred to urology. Per patient, had urology visit with Dr. Margo Aye on 8/02 where he was told his Jardiance 10 mg daily may be contributing to the problem and recommended stopping. Patient continued taking Jardiance because he wanted to have this visit to confirm he should stop. Of note, last A1C as of February 2024 5.7%.    Past Medical History:  Diagnosis Date   Anemia due to blood loss, acute 2014   2nd GIB   Anxiety    CKD (chronic kidney disease), stage III (HCC) 2014   Diabetes mellitus without complication (HCC)    Gout    High cholesterol    Hypertension 2014   Hypothyroid    Neuropathy    PUD (peptic ulcer disease) 2014    Review of Systems  Constitutional:  Positive for malaise/fatigue. Negative for chills and fever.  HENT:  Positive for congestion.   Cardiovascular:  Negative for chest pain and palpitations.  Gastrointestinal:   Negative for abdominal pain, nausea and vomiting.  Genitourinary:  Positive for frequency and urgency.  Skin:  Negative for rash.  Neurological:  Negative for dizziness and headaches.     Objective:     There were no vitals taken for this visit. BP Readings from Last 3 Encounters:  11/28/22 (!) 133/91  11/19/22 (!) 152/99  08/04/22 112/78   Wt Readings from Last 3 Encounters:  11/28/22 227 lb (103 kg)  11/19/22 227 lb 11.2 oz (103.3 kg)  08/04/22 226 lb 8 oz (102.7 kg)     Physical Exam Telehealth visit, no physical exam performed.   No results found for any visits on 12/11/22.   The ASCVD Risk score (Arnett DK, et al., 2019) failed to calculate for the following reasons:   The valid total cholesterol range is 130 to 320 mg/dL    Assessment & Plan:   Problem List Items Addressed This Visit       Other   Urge incontinence - Primary    Patient recently seen by Dr. Hall/urology on 8/02 who recommended stopping Jardiance as it may be contributing to urinary incontinence. Patient currently taking Jardiance 10 mg with well-controlled A1C of 5.7% as of February 2024. Before Jardiance, patient was on metformin 1000 mg, which was changed to Jardiance due to reported fecal incontinence. Patient to follow up with Dr. Margo Aye after stopping [name] for potential trial of Gemtesa/Vibegron if urinary incontinence does not improve.  Plan:  - STOP/discontinue  Jardiance 10 mg daily - Follow up urinary incontinence, A1C in about 3 months      COVID-19 virus infection    Patient currently on day 4 of 5 of daily Paxlovid with improvement in symptoms. Discussed medication interactions between Paxlovid and anticoagulants like Xarelto which he takes daily, as taking both at the same time can increase risk of bleeding. Patient denies any overt bleeding today and with improvement in symptoms is ok stopping Paxlovid before completing his 5th dose. Also discussed potential effects of sedation with  DM cough syrup and patient and his wife acknowledged understanding.  Plan:  - STOP Paxlovid, do not take 5th dose        Return in about 3 months (around 03/13/2023) for Urinary incontinence follow up, BP check .   Patient discussed with Dr. Vita Barley Colbert Coyer, MD

## 2022-12-13 DIAGNOSIS — U071 COVID-19: Secondary | ICD-10-CM | POA: Insufficient documentation

## 2022-12-13 NOTE — Assessment & Plan Note (Signed)
Patient currently on day 4 of 5 of daily Paxlovid with improvement in symptoms. Discussed medication interactions between Paxlovid and anticoagulants like Xarelto which he takes daily, as taking both at the same time can increase risk of bleeding. Patient denies any overt bleeding today and with improvement in symptoms is ok stopping Paxlovid before completing his 5th dose. Also discussed potential effects of sedation with DM cough syrup and patient and his wife acknowledged understanding.  Plan:  - STOP Paxlovid, do not take 5th dose

## 2022-12-13 NOTE — Assessment & Plan Note (Signed)
Patient recently seen by Dr. Hall/urology on 8/02 who recommended stopping Jardiance as it may be contributing to urinary incontinence. Patient currently taking Jardiance 10 mg with well-controlled A1C of 5.7% as of February 2024. Before Jardiance, patient was on metformin 1000 mg, which was changed to Jardiance due to reported fecal incontinence. Patient to follow up with Dr. Margo Aye after stopping [name] for potential trial of Gemtesa/Vibegron if urinary incontinence does not improve.  Plan:  - STOP/discontinue Jardiance 10 mg daily - Follow up urinary incontinence, A1C in about 3 months

## 2022-12-14 ENCOUNTER — Other Ambulatory Visit: Payer: Self-pay | Admitting: Cardiovascular Disease

## 2022-12-14 ENCOUNTER — Other Ambulatory Visit: Payer: Self-pay | Admitting: Internal Medicine

## 2022-12-14 DIAGNOSIS — N401 Enlarged prostate with lower urinary tract symptoms: Secondary | ICD-10-CM

## 2022-12-15 NOTE — Progress Notes (Signed)
 Internal Medicine Clinic Attending  Case discussed with the resident physician at the time of the visit.  We reviewed the patient's history, exam, and pertinent patient test results.  I agree with the assessment, diagnosis, and plan of care documented in the resident's note.

## 2023-01-05 ENCOUNTER — Other Ambulatory Visit: Payer: Self-pay | Admitting: Student

## 2023-01-09 DIAGNOSIS — Z23 Encounter for immunization: Secondary | ICD-10-CM | POA: Diagnosis not present

## 2023-02-09 DIAGNOSIS — H2511 Age-related nuclear cataract, right eye: Secondary | ICD-10-CM | POA: Diagnosis not present

## 2023-02-10 DIAGNOSIS — H2512 Age-related nuclear cataract, left eye: Secondary | ICD-10-CM | POA: Diagnosis not present

## 2023-02-15 ENCOUNTER — Other Ambulatory Visit: Payer: Self-pay | Admitting: Student

## 2023-02-15 DIAGNOSIS — R3915 Urgency of urination: Secondary | ICD-10-CM

## 2023-02-17 NOTE — Telephone Encounter (Signed)
Next appt scheduled 11/18 with Dr Ninfa Meeker.

## 2023-02-23 DIAGNOSIS — H2512 Age-related nuclear cataract, left eye: Secondary | ICD-10-CM | POA: Diagnosis not present

## 2023-02-23 DIAGNOSIS — Z9889 Other specified postprocedural states: Secondary | ICD-10-CM | POA: Diagnosis not present

## 2023-02-23 DIAGNOSIS — H25012 Cortical age-related cataract, left eye: Secondary | ICD-10-CM | POA: Diagnosis not present

## 2023-02-23 DIAGNOSIS — H25042 Posterior subcapsular polar age-related cataract, left eye: Secondary | ICD-10-CM | POA: Diagnosis not present

## 2023-02-28 ENCOUNTER — Other Ambulatory Visit: Payer: Self-pay | Admitting: Internal Medicine

## 2023-02-28 ENCOUNTER — Other Ambulatory Visit: Payer: Self-pay | Admitting: Gastroenterology

## 2023-02-28 ENCOUNTER — Other Ambulatory Visit: Payer: Self-pay | Admitting: Student

## 2023-02-28 DIAGNOSIS — K922 Gastrointestinal hemorrhage, unspecified: Secondary | ICD-10-CM

## 2023-03-02 ENCOUNTER — Other Ambulatory Visit: Payer: Self-pay | Admitting: Gastroenterology

## 2023-03-02 DIAGNOSIS — D500 Iron deficiency anemia secondary to blood loss (chronic): Secondary | ICD-10-CM

## 2023-03-02 MED ORDER — IRON (FERROUS SULFATE) 325 (65 FE) MG PO TABS: 1.0000 | ORAL_TABLET | Freq: Every day | ORAL | 1 refills | Status: DC

## 2023-03-02 NOTE — Telephone Encounter (Signed)
Jonathon [name],  Patient last saw August 2023 for iron deficiency anemia.  This is a refill request that can be refilled at same dosing instructions with 2 refills.  However, his last CBC on file was from April of this year, and last iron studies are from February of this year.  Please contact him to arrange a visit to our lab in the next 2 weeks to check a CBC and iron studies (iron, TIBC, ferritin) for iron deficiency anemia  Thank you  Ellwood Dense MD

## 2023-03-02 NOTE — Telephone Encounter (Signed)
Next appt scheduled 11/18 with Dr Ninfa Meeker.

## 2023-03-04 ENCOUNTER — Ambulatory Visit: Payer: Medicare Other | Admitting: Urology

## 2023-03-05 ENCOUNTER — Encounter: Payer: Self-pay | Admitting: Urology

## 2023-03-05 ENCOUNTER — Ambulatory Visit: Payer: Medicare Other | Admitting: Urology

## 2023-03-05 VITALS — BP 161/107 | HR 86 | Ht 72.0 in | Wt 227.0 lb

## 2023-03-05 DIAGNOSIS — N401 Enlarged prostate with lower urinary tract symptoms: Secondary | ICD-10-CM

## 2023-03-05 DIAGNOSIS — N3281 Overactive bladder: Secondary | ICD-10-CM | POA: Diagnosis not present

## 2023-03-05 DIAGNOSIS — R399 Unspecified symptoms and signs involving the genitourinary system: Secondary | ICD-10-CM | POA: Diagnosis not present

## 2023-03-05 DIAGNOSIS — R8281 Pyuria: Secondary | ICD-10-CM

## 2023-03-05 DIAGNOSIS — R8271 Bacteriuria: Secondary | ICD-10-CM | POA: Diagnosis not present

## 2023-03-05 LAB — BLADDER SCAN AMB NON-IMAGING

## 2023-03-05 MED ORDER — SOLIFENACIN SUCCINATE 10 MG PO TABS: 10.0000 mg | ORAL_TABLET | Freq: Every day | ORAL | 11 refills | Status: DC

## 2023-03-05 NOTE — Progress Notes (Addendum)
Assessment: 1. Benign localized prostatic hyperplasia with lower urinary tract symptoms (LUTS)   2. OAB (overactive bladder)     Plan: Urine for culture today-hold treatment pending culture results Will trial alternative medical therapy with Solifenacin 10 mg daily (patient will begin with 5 mg daily for 2 weeks and titrate up if necessary) Discontinue Gemtesa Follow-up 4 months or sooner if problems arise  ADDENDUM:  UC POSITIVE FOR PSEUDOMONAS UTI. RX CIPRO X 7 D  Chief Complaint: freq  HPI: Jonathon [name] is a 72 y.o. male who presents for continued evaluation of lower urinary tract symptoms. See my note dated 11/28/2022 to time of initial visit for detailed history and exam. This is a longstanding problem dating back at least 6 years.  The patient previously was under the care of Dr. Dorise Hiss at [facility].  He underwent full evaluation including cystoscopy at that time that was negative.  He has been on tamsulosin since that time and for the past year has also been on finasteride. Patient was also evaluated in 2021 by Dr. Vernie Ammons at [facility] for gross hematuria and underwent CT and cystoscopy which was negative.   PVR= 2ml   The patient at baseline visit had AUA symptom score of 12 but is predominantly frequency urgency and urge incontinence.   IPSS today = 9 again predominantly frequency and urgency He does not wear pads but changes his underwear frequently during the day.  Patient also reports being bothered by some degree of constipation and hard stools.  At his initial visit I recommended a formal bowel regimen and also started him on Gemtesa in addition to his other meds.  He also was taken off the [place] and says that that has helped somewhat.  They report that the Gemtesa did not help very much.  UA today does show pyuria and some bacteria-we will culture  Portions of the above documentation were copied from a prior visit for review purposes  only.  Allergies: Allergies  Allergen Reactions   Morphine And Codeine Itching    PMH: Past Medical History:  Diagnosis Date   Anemia due to blood loss, acute 2014   2nd GIB   Anxiety    CKD (chronic kidney disease), stage III (HCC) 2014   Diabetes mellitus without complication (HCC)    Gout    High cholesterol    Hypertension 2014   Hypothyroid    Neuropathy    PUD (peptic ulcer disease) 2014    PSH: Past Surgical History:  Procedure Laterality Date   ABDOMINAL AORTOGRAM W/LOWER EXTREMITY Bilateral 06/27/2019   Procedure: ABDOMINAL AORTOGRAM W/LOWER EXTREMITY;  Surgeon: Sherren Kerns, MD;  Location: [facility];  Service: Vascular;  Laterality: Bilateral;   ADRENALECTOMY     AMPUTATION Right 07/18/2019   Procedure: AMPUTATION RIGHT GREAT TOE;  Surgeon: Cephus Shelling, MD;  Location: [facility];  Service: Vascular;  Laterality: Right;   BIOPSY  09/09/2021   Procedure: BIOPSY;  Surgeon: Shellia Cleverly, DO;  Location: [facility];  Service: Gastroenterology;;   COLONOSCOPY WITH PROPOFOL N/A 09/09/2021   Procedure: COLONOSCOPY WITH PROPOFOL;  Surgeon: Shellia Cleverly, DO;  Location: [facility];  Service: Gastroenterology;  Laterality: N/A;   COLONOSCOPY WITH PROPOFOL N/A 09/20/2021   Procedure: COLONOSCOPY WITH PROPOFOL;  Surgeon: Napoleon Form, MD;  Location: [facility];  Service: Gastroenterology;  Laterality: N/A;   EMBOLECTOMY Right 06/27/2019   Procedure: RIGHT LEG EMBOLECTOMY;  Surgeon: Sherren Kerns, MD;  Location: [facility] OR;  Service: Vascular;  Laterality: Right;   ESOPHAGOGASTRODUODENOSCOPY (EGD) WITH PROPOFOL N/A 09/09/2021   Procedure: ESOPHAGOGASTRODUODENOSCOPY (EGD) WITH PROPOFOL;  Surgeon: Shellia Cleverly, DO;  Location: [facility];  Service: Gastroenterology;  Laterality: N/A;   HEMOSTASIS CLIP PLACEMENT  09/20/2021   Procedure: HEMOSTASIS CLIP PLACEMENT;  Surgeon: Napoleon Form, MD;  Location: [facility];  Service:  Gastroenterology;;   POLYPECTOMY  09/09/2021   Procedure: POLYPECTOMY;  Surgeon: Shellia Cleverly, DO;  Location: [facility];  Service: Gastroenterology;;  EGD and COLON    SH: Social History   Tobacco Use   Smoking status: Former    Current packs/day: 2.00    Average packs/day: 2.0 packs/day for 3.0 years (6.0 ttl pk-yrs)    Types: Cigarettes   Smokeless tobacco: Former    Quit date: 05/08/1967  Vaping Use   Vaping status: Never Used  Substance Use Topics   Alcohol use: Yes    Alcohol/week: 14.0 standard drinks of alcohol    Types: 14 Shots of liquor per week    Comment: daily   Drug use: Yes    Types: Marijuana    ROS: Constitutional:  Negative for fever, chills, weight loss CV: Negative for chest pain, previous MI, hypertension Respiratory:  Negative for shortness of breath, wheezing, sleep apnea, frequent cough GI:  Negative for nausea, vomiting, bloody stool, GERD  PE: BP (!) 161/107   Pulse 86   Ht 6' (1.829 m)   Wt 227 lb (103 kg)   BMI 30.79 kg/m  GENERAL APPEARANCE:  Well appearing, well developed, well nourished, NAD    Results: Results for orders placed or performed in visit on 03/05/23 (from the past 24 hour(s))  BLADDER SCAN AMB NON-IMAGING   Collection Time: 03/05/23 12:00 AM  Result Value Ref Range   Scan Result 0ml

## 2023-03-08 LAB — URINE CULTURE

## 2023-03-09 ENCOUNTER — Telehealth: Payer: Self-pay

## 2023-03-09 MED ORDER — CIPROFLOXACIN HCL 500 MG PO TABS: 500.0000 mg | ORAL_TABLET | Freq: Two times a day (BID) | ORAL | 0 refills | Status: DC

## 2023-03-09 NOTE — Addendum Note (Signed)
Addended by: Marcha Solders C on: 03/09/2023 10:50 AM   Modules accepted: Orders

## 2023-03-09 NOTE — Telephone Encounter (Signed)
Spoke with wife, she is aware of the results and the need for medication. She verbalized understanding of getting the medication started soon and completing the entire course.   Follow up appt scheduled.

## 2023-03-09 NOTE — Telephone Encounter (Signed)
-----   Message from Joline Maxcy sent at 03/09/2023 10:48 AM EST ----- Regarding: UTI Please call wife-- Urine culture positive.  May be reason for worsening symptoms. Sent in rx for cipro x 7 days Should see back in a few weeks to make sure it is clear

## 2023-03-10 LAB — URINALYSIS, ROUTINE W REFLEX MICROSCOPIC
Bilirubin, UA: NEGATIVE
Glucose, UA: NEGATIVE
Ketones, UA: NEGATIVE
Nitrite, UA: POSITIVE — AB
Specific Gravity, UA: 1.02 (ref 1.005–1.030)
Urobilinogen, Ur: 0.2 mg/dL (ref 0.2–1.0)
pH, UA: 7 (ref 5.0–7.5)

## 2023-03-10 LAB — MICROSCOPIC EXAMINATION: WBC, UA: >30 /[HPF] — ABNORMAL HIGH (ref 0–5)

## 2023-03-16 ENCOUNTER — Encounter: Payer: Medicare Other | Admitting: Student

## 2023-03-16 ENCOUNTER — Ambulatory Visit: Payer: Medicare Other | Admitting: Student

## 2023-03-16 VITALS — BP 137/97 | HR 54 | Temp 97.8°F | Ht 73.0 in | Wt 228.3 lb

## 2023-03-16 DIAGNOSIS — E1122 Type 2 diabetes mellitus with diabetic chronic kidney disease: Secondary | ICD-10-CM | POA: Diagnosis not present

## 2023-03-16 DIAGNOSIS — E1142 Type 2 diabetes mellitus with diabetic polyneuropathy: Secondary | ICD-10-CM

## 2023-03-16 DIAGNOSIS — I10 Essential (primary) hypertension: Secondary | ICD-10-CM

## 2023-03-16 DIAGNOSIS — N189 Chronic kidney disease, unspecified: Secondary | ICD-10-CM

## 2023-03-16 DIAGNOSIS — N3941 Urge incontinence: Secondary | ICD-10-CM

## 2023-03-16 DIAGNOSIS — Z0000 Encounter for general adult medical examination without abnormal findings: Secondary | ICD-10-CM

## 2023-03-16 LAB — POCT GLYCOSYLATED HEMOGLOBIN (HGB A1C): Hemoglobin A1C: 5.8 % — AB (ref 4.0–5.6)

## 2023-03-16 LAB — GLUCOSE, CAPILLARY: Glucose-Capillary: 150 mg/dL — ABNORMAL HIGH (ref 70–99)

## 2023-03-16 MED ORDER — AMLODIPINE BESYLATE 5 MG PO TABS: 5.0000 mg | ORAL_TABLET | Freq: Every day | ORAL | 11 refills | Status: DC

## 2023-03-16 NOTE — Progress Notes (Cosign Needed Addendum)
CC: 6 month follow-up  HPI:  Jonathon [name] is a 72 y.o. male living with a history stated below and presents today for follow up.  He does not have any complaints today.  He intends to start physical therapy on Wednesday 11/20 as he has had balance issues in the setting of a right great toe amputation secondary to severe peripheral artery disease.   Please see problem based assessment and plan for additional details.  Past Medical History:  Diagnosis Date   Anemia due to blood loss, acute 2014   2nd GIB   Anxiety    CKD (chronic kidney disease), stage III (HCC) 2014   Diabetes mellitus without complication (HCC)    Gout    High cholesterol    Hypertension 2014   Hypothyroid    Neuropathy    PUD (peptic ulcer disease) 2014    Current Outpatient Medications on File Prior to Visit  Medication Sig Dispense Refill   albuterol (VENTOLIN HFA) 108 (90 Base) MCG/ACT inhaler Inhale 2 puffs into the lungs every 6 (six) hours as needed for wheezing or shortness of breath. (Patient not taking: Reported on 11/28/2022) 8 g 2   aspirin EC 81 MG EC tablet Take 1 tablet (81 mg total) by mouth daily.     atorvastatin (LIPITOR) 80 MG tablet TAKE 1 TABLET BY MOUTH EVERYDAY AT BEDTIME 90 tablet 2   ciprofloxacin (CIPRO) 500 MG tablet Take 1 tablet (500 mg total) by mouth 2 (two) times daily. 14 tablet 0   fenofibrate 54 MG tablet TAKE 1 TABLET(54 MG) BY MOUTH DAILY 90 tablet 1   finasteride (PROSCAR) 5 MG tablet TAKE 1 TABLET (5 MG TOTAL) BY MOUTH DAILY. 90 tablet 3   levothyroxine (SYNTHROID) 112 MCG tablet Take 1 tablet (112 mcg total) by mouth at bedtime. 90 tablet 3   Magnesium 250 MG TABS Take 1 tablet (250 mg total) by mouth daily. 30 tablet 0   metoprolol succinate (TOPROL-XL) 25 MG 24 hr tablet TAKE 1 TABLET (25 MG TOTAL) BY MOUTH DAILY. 90 tablet 3   pantoprazole (PROTONIX) 40 MG tablet TAKE 1 TABLET BY MOUTH EVERY DAY 90 tablet 1   pregabalin (LYRICA) 75 MG capsule TAKE 2 CAPSULES  (150 MG TOTAL) BY MOUTH AT BEDTIME. 180 capsule 2   sertraline (ZOLOFT) 100 MG tablet Take 1 tablet (100 mg total) by mouth daily. 90 tablet 4   solifenacin (VESICARE) 10 MG tablet Take 1 tablet (10 mg total) by mouth daily. 30 tablet 11   tamsulosin (FLOMAX) 0.4 MG CAPS capsule TAKE 1 CAPSULE BY MOUTH EVERY DAY 90 capsule 1   traZODone (DESYREL) 100 MG tablet TAKE 1 TABLET BY MOUTH EVERYDAY AT BEDTIME 90 tablet 1   XARELTO 20 MG TABS tablet TAKE 1 TABLET BY MOUTH EVERY DAY 60 tablet 5   No current facility-administered medications on file prior to visit.    Family History  Problem Relation Age of Onset   Diabetes Mellitus II Maternal Aunt    Lung cancer Mother    Pancreatic cancer Sister     Social History   Socioeconomic History   Marital status: Married    Spouse name: Not on file   Number of children: Not on file   Years of education: Not on file   Highest education level: Not on file  Occupational History   Occupation: banking    Comment: retired  Tobacco Use   Smoking status: Former    Current packs/day: 2.00  Average packs/day: 2.0 packs/day for 3.0 years (6.0 ttl pk-yrs)    Types: Cigarettes   Smokeless tobacco: Former    Quit date: 05/08/1967  Vaping Use   Vaping status: Never Used  Substance and Sexual Activity   Alcohol use: Yes    Alcohol/week: 14.0 standard drinks of alcohol    Types: 14 Shots of liquor per week    Comment: daily   Drug use: Yes    Types: Marijuana   Sexual activity: Not on file  Other Topics Concern   Not on file  Social History Narrative   Not on file   Social Determinants of Health   Financial Resource Strain: Low Risk  (05/30/2022)   Overall Financial Resource Strain (CARDIA)    Difficulty of Paying Living Expenses: Not hard at all  Food Insecurity: No Food Insecurity (05/30/2022)   Hunger Vital Sign    Worried About Running Out of Food in the Last Year: Never true    Ran Out of Food in the Last Year: Never true  Transportation  Needs: No Transportation Needs (05/30/2022)   PRAPARE - Administrator, Civil Service (Medical): No    Lack of Transportation (Non-Medical): No  Physical Activity: Inactive (05/30/2022)   Exercise Vital Sign    Days of Exercise per Week: 0 days    Minutes of Exercise per Session: 0 min  Stress: No Stress Concern Present (05/30/2022)   [employer] of Occupational Health - Occupational Stress Questionnaire    Feeling of Stress : Not at all  Social Connections: Moderately Isolated (05/30/2022)   Social Connection and Isolation Panel [NHANES]    Frequency of Communication with Friends and Family: Twice a week    Frequency of Social Gatherings with Friends and Family: Once a week    Attends Religious Services: Never    Database administrator or Organizations: No    Attends Banker Meetings: Never    Marital Status: Married  Catering manager Violence: Not At Risk (05/30/2022)   Humiliation, Afraid, Rape, and Kick questionnaire    Fear of Current or Ex-Partner: No    Emotionally Abused: No    Physically Abused: No    Sexually Abused: No    Review of Systems: ROS negative except for what is noted on the assessment and plan.  Vitals:   03/16/23 1330 03/16/23 1425  BP: (!) 140/85 (!) 137/97  Pulse: 65 (!) 54  Temp: 97.8 F (36.6 C)   SpO2: 96%   Weight: 228 lb 4.8 oz (103.6 kg)   Height: 6\' 1"  (1.854 m)     Physical Exam: Constitutional: well-appearing man sitting in wheelchair, in no acute distress HENT: normocephalic atraumatic, mucous membranes moist Eyes: conjunctiva non-erythematous Cardiovascular: regular rate and rhythm, no m/r/g Pulmonary/Chest: normal work of breathing on room air, lungs clear to auscultation bilaterally Abdominal: soft, non-tender, non-distended MSK: normal bulk and tone Neurological: alert & oriented x 3, no focal deficit Skin: warm and dry Psych: normal mood and behavior  Assessment & Plan:   Patient seen with Dr.  Lafonda Mosses  Essential hypertension Today's blood pressure is 137/97.  Asymptomatic.  This is not at goal given his comorbidities including diabetes, A-fib, vascular disease.  An appropriate goal for him would be 130/80.  His only current blood pressure medicine is his metoprolol which she takes for A-fib.  Today I will restart amlodipine 5 which he was on in the past.  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  Most recent was quite good, 5.7 earlier this year.  Will consider restarting metformin if necessary.  Currently he is not on anything.  He does appear to have had diabetic kidney disease that has actually improved over the past several months in accordance with his diabetic control.  Will check BMP today.  ACR is due as well, but he does not need to urinate, will ask him to have it checked at his upcoming urology appointment.  Health care maintenance Mr. Saldanha is due for colon cancer screening.  Will do home FOBT test.  Hypomagnesemia Without symptoms.  He takes supplements.  Will check mag today.  Diabetic peripheral neuropathy associated with type 2 diabetes mellitus (HCC) Stable.  Foot exam today.  He remains on Lyrica.  Urge incontinence He has established care with urology, Dr. Margo Aye.  They stopped Jardiance, treated him for UTI, and started Solifenacin.  His symptoms are much improved and will remain in touch with his urologist.  Return in 6 months.  Medication changes today was the starting of amlodipine 5.  At that time address blood pressure, diabetes, at provider's discretion.  Katheran James, D.O. [facility] Internal Medicine, PGY-1 Phone: [phone] Date 03/16/2023 Time 4:12 PM

## 2023-03-16 NOTE — Assessment & Plan Note (Signed)
Without symptoms.  He takes supplements.  Will check mag today.

## 2023-03-16 NOTE — Patient Instructions (Signed)
Jonathon [name],  [place], I feel good about your health at this checkup. You have diabetes, atrial fibrillation, and vascular disease, but at the moment these appear to be under good control. However, it will be necessary to start a new medicine for blood pressure - amlodipine. Over time, high blood pressure can damage you eyes, kidneys, and nerves. We will check basic bloodwork as well.  Please keep a log of your home blood pressures - at least twice weekly.  Please ask your urologist to check ACR - a urine test.  Best, Dr. Ninfa Meeker

## 2023-03-16 NOTE — Assessment & Plan Note (Signed)
Today's blood pressure is 137/97.  Asymptomatic.  This is not at goal given his comorbidities including diabetes, A-fib, vascular disease.  An appropriate goal for him would be 130/80.  His only current blood pressure medicine is his metoprolol which she takes for A-fib.  Today I will restart amlodipine 5 which he was on in the past.

## 2023-03-16 NOTE — Assessment & Plan Note (Signed)
Stable.  Foot exam today.  He remains on Lyrica.

## 2023-03-16 NOTE — Assessment & Plan Note (Signed)
Jonathon [name] is due for colon cancer screening.  Will do home FOBT test.

## 2023-03-16 NOTE — Assessment & Plan Note (Signed)
He has established care with urology, Dr. Margo Aye.  They stopped Jardiance, treated him for UTI, and started Solifenacin.  His symptoms are much improved and will remain in touch with his urologist.

## 2023-03-16 NOTE — Assessment & Plan Note (Addendum)
A1c is pending at this time.  Most recent was quite good, 5.7 earlier this year.  Will consider restarting metformin if necessary.  Currently he is not on anything.  He does appear to have had diabetic kidney disease that has actually improved over the past several months in accordance with his diabetic control.  Will check BMP today.  ACR is due as well, but he does not need to urinate, will ask him to have it checked at his upcoming urology appointment.

## 2023-03-17 LAB — BASIC METABOLIC PANEL
BUN/Creatinine Ratio: 10 (ref 10–24)
BUN: 14 mg/dL (ref 8–27)
CO2: 23 mmol/L (ref 20–29)
Calcium: 9.2 mg/dL (ref 8.6–10.2)
Chloride: 95 mmol/L — ABNORMAL LOW (ref 96–106)
Creatinine, Ser: 1.35 mg/dL — ABNORMAL HIGH (ref 0.76–1.27)
Glucose: 129 mg/dL — ABNORMAL HIGH (ref 70–99)
Potassium: 3.6 mmol/L (ref 3.5–5.2)
Sodium: 135 mmol/L (ref 134–144)
eGFR: 56 mL/min/{1.73_m2} — ABNORMAL LOW (ref >59)

## 2023-03-17 LAB — MAGNESIUM: Magnesium: 1.5 mg/dL — ABNORMAL LOW (ref 1.6–2.3)

## 2023-03-17 NOTE — Addendum Note (Signed)
Addended by: Derrek Monaco on: 03/17/2023 09:59 AM   Modules accepted: Level of Service

## 2023-03-17 NOTE — Progress Notes (Signed)
Internal Medicine Clinic Attending  I was physically present during the key portions of the resident provided service and participated in the medical decision making of patient's management care. I reviewed pertinent patient test results.  The assessment, diagnosis, and plan were formulated together and I agree with the documentation in the resident's note.  Mercie Eon, MD   I agree with plan to start Amlodipine 5mg  daily for HTN

## 2023-03-18 DIAGNOSIS — M6281 Muscle weakness (generalized): Secondary | ICD-10-CM | POA: Diagnosis not present

## 2023-03-18 DIAGNOSIS — R2681 Unsteadiness on feet: Secondary | ICD-10-CM | POA: Diagnosis not present

## 2023-03-18 DIAGNOSIS — R2689 Other abnormalities of gait and mobility: Secondary | ICD-10-CM | POA: Diagnosis not present

## 2023-03-18 DIAGNOSIS — R262 Difficulty in walking, not elsewhere classified: Secondary | ICD-10-CM | POA: Diagnosis not present

## 2023-03-19 ENCOUNTER — Ambulatory Visit: Payer: Medicare Other | Admitting: Urology

## 2023-04-08 DIAGNOSIS — R2689 Other abnormalities of gait and mobility: Secondary | ICD-10-CM | POA: Diagnosis not present

## 2023-04-08 DIAGNOSIS — R262 Difficulty in walking, not elsewhere classified: Secondary | ICD-10-CM | POA: Diagnosis not present

## 2023-04-08 DIAGNOSIS — R2681 Unsteadiness on feet: Secondary | ICD-10-CM | POA: Diagnosis not present

## 2023-04-08 DIAGNOSIS — M6281 Muscle weakness (generalized): Secondary | ICD-10-CM | POA: Diagnosis not present

## 2023-04-09 ENCOUNTER — Ambulatory Visit: Payer: Medicare Other | Admitting: Urology

## 2023-04-09 VITALS — BP 110/72 | HR 76

## 2023-04-09 DIAGNOSIS — N401 Enlarged prostate with lower urinary tract symptoms: Secondary | ICD-10-CM | POA: Diagnosis not present

## 2023-04-09 DIAGNOSIS — Z87440 Personal history of urinary (tract) infections: Secondary | ICD-10-CM | POA: Diagnosis not present

## 2023-04-09 NOTE — Progress Notes (Signed)
Assessment: 1. Benign localized prostatic hyperplasia with lower urinary tract symptoms (LUTS)     Plan: Patient will bring urine for UA and C&S if necessary tomorrow.  Follow-up as scheduled.  Chief Complaint: Follow-up for recent UTI  HPI: Jonathon [name] is a 72 y.o. male who presents for continued evaluation of lower urinary tract symptoms/BPH and recent Pseudomonas UTI. Patient reports that he is doing much better and his UTI symptoms have resolved.  Unfortunately, today he could not void in the office.  He lives a short distance away and was given a specimen cup including swabs to obtain a proper specimen that they will bring in tomorrow for urinalysis and possible culture.   See my note dated 11/28/2022 to time of initial visit for detailed history and exam. This is a longstanding problem dating back at least 6 years.  The patient previously was under the care of Dr. Dorise Hiss at [facility].  He underwent full evaluation including cystoscopy at that time that was negative.  He has been on tamsulosin since that time and for the past year has also been on finasteride. Patient was also evaluated in 2021 by Dr. Vernie Ammons at [facility] for gross hematuria and underwent CT and cystoscopy which was negative.   PVR= 2ml   The patient at baseline visit had AUA symptom score of 12 but is predominantly frequency urgency and urge incontinence.   IPSS today = 9 again predominantly frequency and urgency He does not wear pads but changes his underwear frequently during the day.  Patient also reports being bothered by some degree of constipation and hard stools.  At his initial visit I recommended a formal bowel regimen and also started him on Gemtesa in addition to his other meds.  He also was taken off the [place] and says that that has helped somewhat.  They report that the Gemtesa did not help very much.  Portions of the above documentation were copied from a prior visit for review purposes  only.  Allergies: Allergies  Allergen Reactions   Morphine And Codeine Itching    PMH: Past Medical History:  Diagnosis Date   Anemia due to blood loss, acute 2014   2nd GIB   Anxiety    CKD (chronic kidney disease), stage III (HCC) 2014   Diabetes mellitus without complication (HCC)    Gout    High cholesterol    Hypertension 2014   Hypothyroid    Neuropathy    PUD (peptic ulcer disease) 2014    PSH: Past Surgical History:  Procedure Laterality Date   ABDOMINAL AORTOGRAM W/LOWER EXTREMITY Bilateral 06/27/2019   Procedure: ABDOMINAL AORTOGRAM W/LOWER EXTREMITY;  Surgeon: Sherren Kerns, MD;  Location: [facility];  Service: Vascular;  Laterality: Bilateral;   ADRENALECTOMY     AMPUTATION Right 07/18/2019   Procedure: AMPUTATION RIGHT GREAT TOE;  Surgeon: Cephus Shelling, MD;  Location[facility] OR;  Service: Vascular;  Laterality: Right;   BIOPSY  09/09/2021   Procedure: BIOPSY;  Surgeon: Shellia Cleverly, DO;  Location: [facility];  Service: Gastroenterology;;   COLONOSCOPY WITH PROPOFOL N/A 09/09/2021   Procedure: COLONOSCOPY WITH PROPOFOL;  Surgeon: Shellia Cleverly, DO;  Location: [facility];  Service: Gastroenterology;  Laterality: N/A;   COLONOSCOPY WITH PROPOFOL N/A 09/20/2021   Procedure: COLONOSCOPY WITH PROPOFOL;  Surgeon: Napoleon Form, MD;  Location: [facility];  Service: Gastroenterology;  Laterality: N/A;   EMBOLECTOMY Right 06/27/2019   Procedure: RIGHT LEG EMBOLECTOMY;  Surgeon: Fabienne Bruns  E, MD;  Location: [facility] OR;  Service: Vascular;  Laterality: Right;   ESOPHAGOGASTRODUODENOSCOPY (EGD) WITH PROPOFOL N/A 09/09/2021   Procedure: ESOPHAGOGASTRODUODENOSCOPY (EGD) WITH PROPOFOL;  Surgeon: Shellia Cleverly, DO;  Location: [facility];  Service: Gastroenterology;  Laterality: N/A;   HEMOSTASIS CLIP PLACEMENT  09/20/2021   Procedure: HEMOSTASIS CLIP PLACEMENT;  Surgeon: Napoleon Form, MD;  Location: [facility];  Service:  Gastroenterology;;   POLYPECTOMY  09/09/2021   Procedure: POLYPECTOMY;  Surgeon: Shellia Cleverly, DO;  Location: [facility];  Service: Gastroenterology;;  EGD and COLON    SH: Social History   Tobacco Use   Smoking status: Former    Current packs/day: 2.00    Average packs/day: 2.0 packs/day for 3.0 years (6.0 ttl pk-yrs)    Types: Cigarettes   Smokeless tobacco: Former    Quit date: 05/08/1967  Vaping Use   Vaping status: Never Used  Substance Use Topics   Alcohol use: Yes    Alcohol/week: 14.0 standard drinks of alcohol    Types: 14 Shots of liquor per week    Comment: daily   Drug use: Yes    Types: Marijuana    ROS: Constitutional:  Negative for fever, chills, weight loss CV: Negative for chest pain, previous MI, hypertension Respiratory:  Negative for shortness of breath, wheezing, sleep apnea, frequent cough GI:  Negative for nausea, vomiting, bloody stool, GERD  PE: BP 110/72   Pulse 76  GENERAL APPEARANCE:  Well appearing, well developed, well nourished, NAD

## 2023-04-13 ENCOUNTER — Telehealth: Payer: Self-pay | Admitting: Urology

## 2023-04-13 NOTE — Telephone Encounter (Signed)
Questions about urine sample he is supposed to bring in. Wife -[phone]

## 2023-04-13 NOTE — Telephone Encounter (Signed)
Spoke with wife, she wanted to know if the sample pt collected on Friday was still any good. Advised her it was not and pt would need to collect a fresh specimen for follow up. New cup at the front desk for pick up.

## 2023-04-15 ENCOUNTER — Other Ambulatory Visit: Payer: Medicare Other

## 2023-04-15 DIAGNOSIS — N401 Enlarged prostate with lower urinary tract symptoms: Secondary | ICD-10-CM | POA: Diagnosis not present

## 2023-04-15 LAB — URINALYSIS, ROUTINE W REFLEX MICROSCOPIC
Bilirubin, UA: NEGATIVE
Glucose, UA: NEGATIVE
Ketones, UA: NEGATIVE
Leukocytes,UA: NEGATIVE
Nitrite, UA: NEGATIVE
RBC, UA: NEGATIVE
Specific Gravity, UA: 1.02 (ref 1.005–1.030)
Urobilinogen, Ur: 0.2 mg/dL (ref 0.2–1.0)
pH, UA: 6.5 (ref 5.0–7.5)

## 2023-04-15 LAB — MICROSCOPIC EXAMINATION: Epithelial Cells (non renal): >10 /[HPF] — AB (ref 0–10)

## 2023-05-04 DIAGNOSIS — R2689 Other abnormalities of gait and mobility: Secondary | ICD-10-CM | POA: Diagnosis not present

## 2023-05-04 DIAGNOSIS — R2681 Unsteadiness on feet: Secondary | ICD-10-CM | POA: Diagnosis not present

## 2023-05-04 DIAGNOSIS — M6281 Muscle weakness (generalized): Secondary | ICD-10-CM | POA: Diagnosis not present

## 2023-05-04 DIAGNOSIS — R262 Difficulty in walking, not elsewhere classified: Secondary | ICD-10-CM | POA: Diagnosis not present

## 2023-05-06 ENCOUNTER — Other Ambulatory Visit: Payer: Medicare Other

## 2023-05-06 DIAGNOSIS — R2689 Other abnormalities of gait and mobility: Secondary | ICD-10-CM | POA: Diagnosis not present

## 2023-05-06 DIAGNOSIS — R2681 Unsteadiness on feet: Secondary | ICD-10-CM | POA: Diagnosis not present

## 2023-05-06 DIAGNOSIS — M6281 Muscle weakness (generalized): Secondary | ICD-10-CM | POA: Diagnosis not present

## 2023-05-06 DIAGNOSIS — R262 Difficulty in walking, not elsewhere classified: Secondary | ICD-10-CM | POA: Diagnosis not present

## 2023-05-11 DIAGNOSIS — R2681 Unsteadiness on feet: Secondary | ICD-10-CM | POA: Diagnosis not present

## 2023-05-11 DIAGNOSIS — M6281 Muscle weakness (generalized): Secondary | ICD-10-CM | POA: Diagnosis not present

## 2023-05-11 DIAGNOSIS — R2689 Other abnormalities of gait and mobility: Secondary | ICD-10-CM | POA: Diagnosis not present

## 2023-05-11 DIAGNOSIS — R262 Difficulty in walking, not elsewhere classified: Secondary | ICD-10-CM | POA: Diagnosis not present

## 2023-05-13 DIAGNOSIS — R2681 Unsteadiness on feet: Secondary | ICD-10-CM | POA: Diagnosis not present

## 2023-05-13 DIAGNOSIS — R262 Difficulty in walking, not elsewhere classified: Secondary | ICD-10-CM | POA: Diagnosis not present

## 2023-05-13 DIAGNOSIS — M6281 Muscle weakness (generalized): Secondary | ICD-10-CM | POA: Diagnosis not present

## 2023-05-13 DIAGNOSIS — R2689 Other abnormalities of gait and mobility: Secondary | ICD-10-CM | POA: Diagnosis not present

## 2023-05-14 ENCOUNTER — Ambulatory Visit (INDEPENDENT_AMBULATORY_CARE_PROVIDER_SITE_OTHER): Payer: Medicare Other | Admitting: Podiatry

## 2023-05-14 ENCOUNTER — Encounter: Payer: Self-pay | Admitting: Podiatry

## 2023-05-14 VITALS — Ht 73.0 in | Wt 228.3 lb

## 2023-05-14 DIAGNOSIS — E1142 Type 2 diabetes mellitus with diabetic polyneuropathy: Secondary | ICD-10-CM | POA: Diagnosis not present

## 2023-05-14 DIAGNOSIS — B351 Tinea unguium: Secondary | ICD-10-CM | POA: Diagnosis not present

## 2023-05-14 DIAGNOSIS — L853 Xerosis cutis: Secondary | ICD-10-CM | POA: Diagnosis not present

## 2023-05-14 DIAGNOSIS — M79675 Pain in left toe(s): Secondary | ICD-10-CM | POA: Diagnosis not present

## 2023-05-14 DIAGNOSIS — M79674 Pain in right toe(s): Secondary | ICD-10-CM | POA: Diagnosis not present

## 2023-05-14 MED ORDER — AMMONIUM LACTATE 12 % EX LOTN: 1.0000 | TOPICAL_LOTION | CUTANEOUS | 0 refills | Status: DC | PRN

## 2023-05-14 NOTE — Progress Notes (Signed)
This patient returns to my office for at risk foot care.  This patient requires this care by a professional since this patient will be at risk due to having PAD, DVT, DM and CKD.  He presents to the office with his wife.  This patient is unable to cut nails himself since the patient cannot reach his nails.These nails are painful walking and wearing shoes.  This patient presents for at risk foot care today.  General Appearance  Alert, conversant and in no acute stress.  Vascular  Dorsalis pedis and posterior tibial  pulses are palpable  bilaterally.  Capillary return is within normal limits  bilaterally. Temperature is within normal limits  bilaterally.  Neurologic  Senn-Weinstein monofilament wire test within normal limits  bilaterally. Muscle power within normal limits bilaterally.  Nails Thick disfigured discolored nails with subungual debris  from hallux to fifth toes left foot   Long thick nails 2-5 right foot.. No evidence of bacterial infection or drainage bilaterally.  Orthopedic  No limitations of motion  feet .  No crepitus or effusions noted.  No bony pathology or digital deformities noted. Amputation right hallux.  Skin  normotropic skin with no porokeratosis noted bilaterally.  No signs of infections or ulcers noted.     Onychomycosis  Pain in right toes  Pain in left toes  Consent was obtained for treatment procedures.   Mechanical debridement of nails 1-5  bilaterally performed with a nail nipper.  Filed with dremel without incident. 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  Xerosis skin -I explained to the patient the etiology of xerosis and various treatment options were extensively discussed.  I explained to the patient the importance of maintaining moisturization of the skin with application of over-the-counter lotion such as Eucerin or Luciderm.  Ammonium lactate was sent to the pharmacy of asked him to apply twice a day he states  understanding    Return office visit  4 months.                   Told patient to return for periodic foot care and evaluation due to potential at risk complications.   Nicholes Rough D.P.M.

## 2023-05-17 ENCOUNTER — Other Ambulatory Visit: Payer: Self-pay | Admitting: Student

## 2023-05-18 DIAGNOSIS — M6281 Muscle weakness (generalized): Secondary | ICD-10-CM | POA: Diagnosis not present

## 2023-05-18 DIAGNOSIS — R262 Difficulty in walking, not elsewhere classified: Secondary | ICD-10-CM | POA: Diagnosis not present

## 2023-05-18 DIAGNOSIS — R2689 Other abnormalities of gait and mobility: Secondary | ICD-10-CM | POA: Diagnosis not present

## 2023-05-18 DIAGNOSIS — R2681 Unsteadiness on feet: Secondary | ICD-10-CM | POA: Diagnosis not present

## 2023-05-19 ENCOUNTER — Other Ambulatory Visit: Payer: Self-pay | Admitting: Student

## 2023-05-19 ENCOUNTER — Other Ambulatory Visit: Payer: Self-pay

## 2023-05-19 DIAGNOSIS — E039 Hypothyroidism, unspecified: Secondary | ICD-10-CM

## 2023-05-19 MED ORDER — LEVOTHYROXINE SODIUM 112 MCG PO TABS: 112.0000 ug | ORAL_TABLET | Freq: Every day | ORAL | 3 refills | Status: DC

## 2023-05-20 DIAGNOSIS — R2681 Unsteadiness on feet: Secondary | ICD-10-CM | POA: Diagnosis not present

## 2023-05-20 DIAGNOSIS — R2689 Other abnormalities of gait and mobility: Secondary | ICD-10-CM | POA: Diagnosis not present

## 2023-05-20 DIAGNOSIS — M6281 Muscle weakness (generalized): Secondary | ICD-10-CM | POA: Diagnosis not present

## 2023-05-20 DIAGNOSIS — R262 Difficulty in walking, not elsewhere classified: Secondary | ICD-10-CM | POA: Diagnosis not present

## 2023-05-21 ENCOUNTER — Telehealth: Payer: Self-pay | Admitting: *Deleted

## 2023-05-21 ENCOUNTER — Telehealth: Payer: Self-pay

## 2023-05-21 NOTE — Telephone Encounter (Signed)
Call from patient's wife stating that patient was unable to get his Pregabalin.  Message was that it needed to be changed to something else.  Got a letter from her insurance company that the Levothyroxine patient is taking has been recalled.  Call to [facility] informed them taht the PA for the [place] had been approved. The levothyroxine had been recalled.  Hilda Blades is on order and will be available for patient either tomorrow or Monday.  Levothyroxine letter will need to be taken to the Pharmacy to see if it is the type  that was given to patient.  Patient's wife was informed of and will take letter to the Pharmacy.

## 2023-05-21 NOTE — Telephone Encounter (Signed)
Decision:Approved  Jarren Stankiewicz (KeyTheodis Blaze) PA Case ID #: [id number] Need Help? Call us at [phone] Outcome Approved today by [facility] NCPDP 2017 Your request has been approved Authorization Expiration Date: 05/20/2024 Drug Lyrica 75MG  capsules ePA cloud logo Form [employer] Electronic PA Form [phone] NCPDP)

## 2023-05-21 NOTE — Telephone Encounter (Signed)
Prior Authorization for patient (Lyrica 75MG  capsules) came through on cover my meds was submitted with last office notes awaiting approval or denial.  ZOX:[id number]

## 2023-05-25 ENCOUNTER — Telehealth: Payer: Self-pay | Admitting: *Deleted

## 2023-05-25 DIAGNOSIS — M6281 Muscle weakness (generalized): Secondary | ICD-10-CM | POA: Diagnosis not present

## 2023-05-25 DIAGNOSIS — R2689 Other abnormalities of gait and mobility: Secondary | ICD-10-CM | POA: Diagnosis not present

## 2023-05-25 DIAGNOSIS — R531 Weakness: Secondary | ICD-10-CM

## 2023-05-25 DIAGNOSIS — R2681 Unsteadiness on feet: Secondary | ICD-10-CM | POA: Diagnosis not present

## 2023-05-25 DIAGNOSIS — R262 Difficulty in walking, not elsewhere classified: Secondary | ICD-10-CM | POA: Diagnosis not present

## 2023-05-25 NOTE — Telephone Encounter (Signed)
Received call from pt's wife  - stated pt has been going to PT on his own, now his insurance co is requesting a referral. He has been going for balance since amputation of his toe. Send referral to [facility] and [employer] (located on [address]) [phone]; [place] [phone]. Thanks

## 2023-05-27 DIAGNOSIS — R2681 Unsteadiness on feet: Secondary | ICD-10-CM | POA: Diagnosis not present

## 2023-05-27 DIAGNOSIS — M6281 Muscle weakness (generalized): Secondary | ICD-10-CM | POA: Diagnosis not present

## 2023-05-27 DIAGNOSIS — R262 Difficulty in walking, not elsewhere classified: Secondary | ICD-10-CM | POA: Diagnosis not present

## 2023-05-27 DIAGNOSIS — R2689 Other abnormalities of gait and mobility: Secondary | ICD-10-CM | POA: Diagnosis not present

## 2023-06-04 DIAGNOSIS — R2689 Other abnormalities of gait and mobility: Secondary | ICD-10-CM | POA: Diagnosis not present

## 2023-06-04 DIAGNOSIS — M6281 Muscle weakness (generalized): Secondary | ICD-10-CM | POA: Diagnosis not present

## 2023-06-04 DIAGNOSIS — R2681 Unsteadiness on feet: Secondary | ICD-10-CM | POA: Diagnosis not present

## 2023-06-04 DIAGNOSIS — R262 Difficulty in walking, not elsewhere classified: Secondary | ICD-10-CM | POA: Diagnosis not present

## 2023-06-08 DIAGNOSIS — R2689 Other abnormalities of gait and mobility: Secondary | ICD-10-CM | POA: Diagnosis not present

## 2023-06-08 DIAGNOSIS — R262 Difficulty in walking, not elsewhere classified: Secondary | ICD-10-CM | POA: Diagnosis not present

## 2023-06-08 DIAGNOSIS — M6281 Muscle weakness (generalized): Secondary | ICD-10-CM | POA: Diagnosis not present

## 2023-06-08 DIAGNOSIS — R2681 Unsteadiness on feet: Secondary | ICD-10-CM | POA: Diagnosis not present

## 2023-06-10 DIAGNOSIS — R2689 Other abnormalities of gait and mobility: Secondary | ICD-10-CM | POA: Diagnosis not present

## 2023-06-10 DIAGNOSIS — M6281 Muscle weakness (generalized): Secondary | ICD-10-CM | POA: Diagnosis not present

## 2023-06-10 DIAGNOSIS — R2681 Unsteadiness on feet: Secondary | ICD-10-CM | POA: Diagnosis not present

## 2023-06-10 DIAGNOSIS — R262 Difficulty in walking, not elsewhere classified: Secondary | ICD-10-CM | POA: Diagnosis not present

## 2023-06-15 DIAGNOSIS — R262 Difficulty in walking, not elsewhere classified: Secondary | ICD-10-CM | POA: Diagnosis not present

## 2023-06-15 DIAGNOSIS — M6281 Muscle weakness (generalized): Secondary | ICD-10-CM | POA: Diagnosis not present

## 2023-06-15 DIAGNOSIS — R2681 Unsteadiness on feet: Secondary | ICD-10-CM | POA: Diagnosis not present

## 2023-06-15 DIAGNOSIS — R2689 Other abnormalities of gait and mobility: Secondary | ICD-10-CM | POA: Diagnosis not present

## 2023-06-22 DIAGNOSIS — R2689 Other abnormalities of gait and mobility: Secondary | ICD-10-CM | POA: Diagnosis not present

## 2023-06-22 DIAGNOSIS — R262 Difficulty in walking, not elsewhere classified: Secondary | ICD-10-CM | POA: Diagnosis not present

## 2023-06-22 DIAGNOSIS — R2681 Unsteadiness on feet: Secondary | ICD-10-CM | POA: Diagnosis not present

## 2023-06-22 DIAGNOSIS — M6281 Muscle weakness (generalized): Secondary | ICD-10-CM | POA: Diagnosis not present

## 2023-06-24 DIAGNOSIS — R2681 Unsteadiness on feet: Secondary | ICD-10-CM | POA: Diagnosis not present

## 2023-06-24 DIAGNOSIS — R2689 Other abnormalities of gait and mobility: Secondary | ICD-10-CM | POA: Diagnosis not present

## 2023-06-24 DIAGNOSIS — M6281 Muscle weakness (generalized): Secondary | ICD-10-CM | POA: Diagnosis not present

## 2023-06-24 DIAGNOSIS — R262 Difficulty in walking, not elsewhere classified: Secondary | ICD-10-CM | POA: Diagnosis not present

## 2023-06-29 DIAGNOSIS — R262 Difficulty in walking, not elsewhere classified: Secondary | ICD-10-CM | POA: Diagnosis not present

## 2023-06-29 DIAGNOSIS — R2681 Unsteadiness on feet: Secondary | ICD-10-CM | POA: Diagnosis not present

## 2023-06-29 DIAGNOSIS — R2689 Other abnormalities of gait and mobility: Secondary | ICD-10-CM | POA: Diagnosis not present

## 2023-06-29 DIAGNOSIS — M6281 Muscle weakness (generalized): Secondary | ICD-10-CM | POA: Diagnosis not present

## 2023-06-30 ENCOUNTER — Other Ambulatory Visit: Payer: Self-pay | Admitting: Student

## 2023-06-30 DIAGNOSIS — K922 Gastrointestinal hemorrhage, unspecified: Secondary | ICD-10-CM

## 2023-06-30 NOTE — Telephone Encounter (Signed)
 Medication sent to pharmacy

## 2023-07-01 DIAGNOSIS — M6281 Muscle weakness (generalized): Secondary | ICD-10-CM | POA: Diagnosis not present

## 2023-07-01 DIAGNOSIS — R262 Difficulty in walking, not elsewhere classified: Secondary | ICD-10-CM | POA: Diagnosis not present

## 2023-07-01 DIAGNOSIS — R2689 Other abnormalities of gait and mobility: Secondary | ICD-10-CM | POA: Diagnosis not present

## 2023-07-01 DIAGNOSIS — R2681 Unsteadiness on feet: Secondary | ICD-10-CM | POA: Diagnosis not present

## 2023-07-02 ENCOUNTER — Ambulatory Visit: Payer: Medicare Other | Admitting: Urology

## 2023-07-06 DIAGNOSIS — M6281 Muscle weakness (generalized): Secondary | ICD-10-CM | POA: Diagnosis not present

## 2023-07-06 DIAGNOSIS — R2689 Other abnormalities of gait and mobility: Secondary | ICD-10-CM | POA: Diagnosis not present

## 2023-07-06 DIAGNOSIS — R262 Difficulty in walking, not elsewhere classified: Secondary | ICD-10-CM | POA: Diagnosis not present

## 2023-07-06 DIAGNOSIS — R2681 Unsteadiness on feet: Secondary | ICD-10-CM | POA: Diagnosis not present

## 2023-07-08 DIAGNOSIS — R2681 Unsteadiness on feet: Secondary | ICD-10-CM | POA: Diagnosis not present

## 2023-07-08 DIAGNOSIS — R2689 Other abnormalities of gait and mobility: Secondary | ICD-10-CM | POA: Diagnosis not present

## 2023-07-08 DIAGNOSIS — M6281 Muscle weakness (generalized): Secondary | ICD-10-CM | POA: Diagnosis not present

## 2023-07-08 DIAGNOSIS — R262 Difficulty in walking, not elsewhere classified: Secondary | ICD-10-CM | POA: Diagnosis not present

## 2023-07-13 DIAGNOSIS — R2689 Other abnormalities of gait and mobility: Secondary | ICD-10-CM | POA: Diagnosis not present

## 2023-07-13 DIAGNOSIS — R2681 Unsteadiness on feet: Secondary | ICD-10-CM | POA: Diagnosis not present

## 2023-07-13 DIAGNOSIS — R262 Difficulty in walking, not elsewhere classified: Secondary | ICD-10-CM | POA: Diagnosis not present

## 2023-07-13 DIAGNOSIS — M6281 Muscle weakness (generalized): Secondary | ICD-10-CM | POA: Diagnosis not present

## 2023-07-15 DIAGNOSIS — R262 Difficulty in walking, not elsewhere classified: Secondary | ICD-10-CM | POA: Diagnosis not present

## 2023-07-15 DIAGNOSIS — R2681 Unsteadiness on feet: Secondary | ICD-10-CM | POA: Diagnosis not present

## 2023-07-15 DIAGNOSIS — R2689 Other abnormalities of gait and mobility: Secondary | ICD-10-CM | POA: Diagnosis not present

## 2023-07-15 DIAGNOSIS — M6281 Muscle weakness (generalized): Secondary | ICD-10-CM | POA: Diagnosis not present

## 2023-07-20 DIAGNOSIS — M6281 Muscle weakness (generalized): Secondary | ICD-10-CM | POA: Diagnosis not present

## 2023-07-20 DIAGNOSIS — R2681 Unsteadiness on feet: Secondary | ICD-10-CM | POA: Diagnosis not present

## 2023-07-20 DIAGNOSIS — R2689 Other abnormalities of gait and mobility: Secondary | ICD-10-CM | POA: Diagnosis not present

## 2023-07-20 DIAGNOSIS — R262 Difficulty in walking, not elsewhere classified: Secondary | ICD-10-CM | POA: Diagnosis not present

## 2023-07-24 NOTE — Progress Notes (Deleted)
 Cardiology Clinic Note   [name] Date of Encounter: 07/24/2023  Primary Care Provider:  Modena Slater, DO Primary Cardiologist:  Reatha Harps, MD  Patient Profile    Jonathon [name] 73 year old male presents to the clinic today for follow-up evaluation of his atrial fibrillation and CHF.  Past Medical History    Past Medical History:  Diagnosis Date   Anemia due to blood loss, acute 2014   2nd GIB   Anxiety    CKD (chronic kidney disease), stage III (HCC) 2014   Diabetes mellitus without complication (HCC)    Gout    High cholesterol    Hypertension 2014   Hypothyroid    Neuropathy    PUD (peptic ulcer disease) 2014   Past Surgical History:  Procedure Laterality Date   ABDOMINAL AORTOGRAM W/LOWER EXTREMITY Bilateral 06/27/2019   Procedure: ABDOMINAL AORTOGRAM W/LOWER EXTREMITY;  Surgeon: Sherren Kerns, MD;  Location: [facility];  Service: Vascular;  Laterality: Bilateral;   ADRENALECTOMY     AMPUTATION Right 07/18/2019   Procedure: AMPUTATION RIGHT GREAT TOE;  Surgeon: Cephus Shelling, MD;  Location: [facility] OR;  Service: Vascular;  Laterality: Right;   BIOPSY  09/09/2021   Procedure: BIOPSY;  Surgeon: Shellia Cleverly, DO;  Location: [facility];  Service: Gastroenterology;;   COLONOSCOPY WITH PROPOFOL N/A 09/09/2021   Procedure: COLONOSCOPY WITH PROPOFOL;  Surgeon: Shellia Cleverly, DO;  Location: [facility];  Service: Gastroenterology;  Laterality: N/A;   COLONOSCOPY WITH PROPOFOL N/A 09/20/2021   Procedure: COLONOSCOPY WITH PROPOFOL;  Surgeon: Napoleon Form, MD;  Location: [facility];  Service: Gastroenterology;  Laterality: N/A;   EMBOLECTOMY Right 06/27/2019   Procedure: RIGHT LEG EMBOLECTOMY;  Surgeon: Sherren Kerns, MD;  Location: [facility] OR;  Service: Vascular;  Laterality: Right;   ESOPHAGOGASTRODUODENOSCOPY (EGD) WITH PROPOFOL N/A 09/09/2021   Procedure: ESOPHAGOGASTRODUODENOSCOPY (EGD) WITH PROPOFOL;  Surgeon:  Shellia Cleverly, DO;  Location: [facility];  Service: Gastroenterology;  Laterality: N/A;   HEMOSTASIS CLIP PLACEMENT  09/20/2021   Procedure: HEMOSTASIS CLIP PLACEMENT;  Surgeon: Napoleon Form, MD;  Location: [facility];  Service: Gastroenterology;;   POLYPECTOMY  09/09/2021   Procedure: POLYPECTOMY;  Surgeon: Shellia Cleverly, DO;  Location: [facility];  Service: Gastroenterology;;  EGD and COLON    Allergies  Allergies  Allergen Reactions   Morphine And Codeine Itching    History of Present Illness    Jonathon [name] has a PMH of atrial fibrillation, lower extremity DVT 06/08/2019, HTN, PAD, CHF, superficial femoral artery occlusion, type 2 diabetes, hypothyroidism, hypogonadism, BPH, CKD stage III, generalized anxiety disorder, HLD, generalized weakness, iron deficiency anemia, and COVID-19 infection.  Echocardiogram 05/30/2020 showed an EF of 50-55%, global hypokinesis, mild LVH, mild biatrial enlargement, and mild dilation of the ascending aorta measuring 41 mm.  His ABIs 3/21 showed no evidence of significant right lower extremity arterial disease and no evidence of significant left lower extremity arterial disease.  He underwent right great toe amputation 07/18/2019.  CHA2DS2-VASc score 5    He was evaluated by Dr. Flora Lipps 06/28/2019 for rate control and management of his persistent atrial fibrillation.  This was during hospitalization for lower extremity embolectomy by vascular surgery.  He reported 10-year history of atrial fibrillation.  He was seen by Bernadene Person NP on 12/17/2021.  He was doing well at that time.  His medication regimen was continued.  He was seen in follow-up by Carlos Levering NP-C on 08/01/2022.  He  was stable from a cardiac standpoint.  He was cardiac unaware.  He denied lower extremity claudication.  He did not have a formal exercise regimen.  He denied bleeding issues.  He presents to the clinic today for follow-up evaluation and states***.  ***  denies chest pain, shortness of breath, lower extremity edema, fatigue, palpitations, melena, hematuria, hemoptysis, diaphoresis, weakness, presyncope, syncope, orthopnea, and PND.  Persistent atrial fibrillation-EKG today shows***.  Cardiac unaware.  CHA2DS2-VASc 5.  Compliant with Xarelto.  Denies bleeding issues. Continue metoprolol, Xarelto Avoid triggers caffeine, chocolate, EtOH, dehydration etc.  Chronic combined systolic and diastolic CHF-echocardiogram 2/22 showed normal LVEF, mild LVH, mild biatrial enlargement and ascending aorta measuring 41 mm. Continue metoprolol Heart healthy low-sodium diet Maintain physical activity Lower extremity support stockings Daily weights  Ascending aortic aneurysm-denies chest and back discomfort.  Blood pressure well-controlled. Repeat echocardiogram Avoid fluoroscopy quinolone antibiotics-list provided  Essential hypertension-BP today***. Maintain blood pressure log Continue metoprolol, amlodipine  Peripheral arterial disease-denies lower extremity claudication.  ABIs 3/21 showed no evidence of significant right lower extremity arterial disease and no evidence of significant left lower extremity arterial disease.  He is status post right great toe amputation 07/18/2019. Continue atorvastatin, aspirin  Hyperlipidemia-LDL***. High-fiber diet Continue atorvastatin, aspirin Follows with PCP  Disposition: Follow-up with Dr. Flora Lipps or me in 12 months.  Home Medications    Prior to Admission medications   Medication Sig Start Date End Date Taking? Authorizing Provider  albuterol (VENTOLIN HFA) 108 (90 Base) MCG/ACT inhaler Inhale 2 puffs into the lungs every 6 (six) hours as needed for wheezing or shortness of breath. 11/19/22   Kathleen Lime, MD  amLODipine (NORVASC) 5 MG tablet Take 1 tablet (5 mg total) by mouth daily. 03/16/23 03/15/24  Katheran James, DO  ammonium lactate (AMLACTIN DAILY) 12 % lotion Apply 1 Application topically as  needed. 05/14/23   Candelaria Stagers, DPM  aspirin EC 81 MG EC tablet Take 1 tablet (81 mg total) by mouth daily. 07/01/19   Emilie Rutter, PA-C  atorvastatin (LIPITOR) 80 MG tablet TAKE 1 TABLET BY MOUTH EVERYDAY AT BEDTIME 12/16/22   O'Neal, Ronnald Ramp, MD  ciprofloxacin (CIPRO) 500 MG tablet Take 1 tablet (500 mg total) by mouth 2 (two) times daily. 03/09/23   Joline Maxcy, MD  fenofibrate 54 MG tablet TAKE 1 TABLET(54 MG) BY MOUTH DAILY 05/19/23   Modena Slater, DO  finasteride (PROSCAR) 5 MG tablet TAKE 1 TABLET (5 MG TOTAL) BY MOUTH DAILY. 12/17/22 12/17/23  Modena Slater, DO  levothyroxine (SYNTHROID) 112 MCG tablet Take 1 tablet (112 mcg total) by mouth at bedtime. 05/19/23   Modena Slater, DO  Magnesium 250 MG TABS Take 1 tablet (250 mg total) by mouth daily. 08/04/22   Steffanie Rainwater, MD  metoprolol succinate (TOPROL-XL) 25 MG 24 hr tablet TAKE 1 TABLET (25 MG TOTAL) BY MOUTH DAILY. 09/29/22   Modena Slater, DO  pantoprazole (PROTONIX) 40 MG tablet TAKE 1 TABLET BY MOUTH EVERY DAY 06/30/23   Modena Slater, DO  pregabalin (LYRICA) 75 MG capsule Take 2 capsules (150 mg total) by mouth at bedtime. 05/20/23 11/16/23  Modena Slater, DO  sertraline (ZOLOFT) 100 MG tablet Take 1 tablet (100 mg total) by mouth daily. 10/28/22   Modena Slater, DO  solifenacin (VESICARE) 10 MG tablet Take 1 tablet (10 mg total) by mouth daily. 03/05/23   Joline Maxcy, MD  tamsulosin (FLOMAX) 0.4 MG CAPS capsule TAKE 1 CAPSULE BY MOUTH EVERY DAY 02/17/23  Modena Slater, DO  traZODone (DESYREL) 100 MG tablet TAKE 1 TABLET BY MOUTH EVERYDAY AT BEDTIME 01/05/23   Modena Slater, DO  XARELTO 20 MG TABS tablet TAKE 1 TABLET BY MOUTH EVERY DAY 03/03/23   Modena Slater, DO    Family History    Family History  Problem Relation Age of Onset   Diabetes Mellitus II Maternal Aunt    Lung cancer Mother    Pancreatic cancer Sister    He indicated that his mother is deceased. He indicated that his father is deceased. He indicated that the status of  his sister is unknown. He indicated that his maternal aunt is deceased.  Social History    Social History   Socioeconomic History   Marital status: Married    Spouse name: Not on file   Number of children: Not on file   Years of education: Not on file   Highest education level: Not on file  Occupational History   Occupation: banking    Comment: retired  Tobacco Use   Smoking status: Former    Current packs/day: 2.00    Average packs/day: 2.0 packs/day for 3.0 years (6.0 ttl pk-yrs)    Types: Cigarettes   Smokeless tobacco: Former    Quit date: 05/08/1967  Vaping Use   Vaping status: Never Used  Substance and Sexual Activity   Alcohol use: Yes    Alcohol/week: 14.0 standard drinks of alcohol    Types: 14 Shots of liquor per week    Comment: daily   Drug use: Yes    Types: Marijuana   Sexual activity: Not on file  Other Topics Concern   Not on file  Social History Narrative   Not on file   Social Drivers of Health   Financial Resource Strain: Low Risk  (05/30/2022)   Overall Financial Resource Strain (CARDIA)    Difficulty of Paying Living Expenses: Not hard at all  Food Insecurity: No Food Insecurity (05/30/2022)   Hunger Vital Sign    Worried About Running Out of Food in the Last Year: Never true    Ran Out of Food in the Last Year: Never true  Transportation Needs: No Transportation Needs (05/30/2022)   PRAPARE - Administrator, Civil Service (Medical): No    Lack of Transportation (Non-Medical): No  Physical Activity: Inactive (05/30/2022)   Exercise Vital Sign    Days of Exercise per Week: 0 days    Minutes of Exercise per Session: 0 min  Stress: No Stress Concern Present (05/30/2022)   [employer] of Occupational Health - Occupational Stress Questionnaire    Feeling of Stress : Not at all  Social Connections: Moderately Isolated (05/30/2022)   Social Connection and Isolation Panel [NHANES]    Frequency of Communication with Friends and Family: Twice  a week    Frequency of Social Gatherings with Friends and Family: Once a week    Attends Religious Services: Never    Database administrator or Organizations: No    Attends Banker Meetings: Never    Marital Status: Married  Catering manager Violence: Not At Risk (05/30/2022)   Humiliation, Afraid, Rape, and Kick questionnaire    Fear of Current or Ex-Partner: No    Emotionally Abused: No    Physically Abused: No    Sexually Abused: No     Review of Systems    General:  No chills, fever, night sweats or weight changes.  Cardiovascular:  No chest pain, dyspnea on  exertion, edema, orthopnea, palpitations, paroxysmal nocturnal dyspnea. Dermatological: No rash, lesions/masses Respiratory: No cough, dyspnea Urologic: No hematuria, dysuria Abdominal:   No nausea, vomiting, diarrhea, bright red blood per rectum, melena, or hematemesis Neurologic:  No visual changes, wkns, changes in mental status. All other systems reviewed and are otherwise negative except as noted above.  Physical Exam    VS:  There were no vitals taken for this visit. , BMI There is no height or weight on file to calculate BMI. GEN: Well nourished, well developed, in no acute distress. HEENT: normal. Neck: Supple, no JVD, carotid bruits, or masses. Cardiac: RRR, no murmurs, rubs, or gallops. No clubbing, cyanosis, edema.  Radials/DP/PT 2+ and equal bilaterally.  Respiratory:  Respirations regular and unlabored, clear to auscultation bilaterally. GI: Soft, nontender, nondistended, BS + x 4. MS: no deformity or atrophy. Skin: warm and dry, no rash. Neuro:  Strength and sensation are intact. Psych: Normal affect.  Accessory Clinical Findings    Recent Labs: 08/04/2022: Hemoglobin 17.3; Platelets 127 03/16/2023: BUN 14; Creatinine, Ser 1.35; Magnesium 1.5; Potassium 3.6; Sodium 135   Recent Lipid Panel    Component Value Date/Time   CHOL 120 08/04/2022 1459   TRIG 86 08/04/2022 1459   HDL 40  08/04/2022 1459   CHOLHDL 3.0 08/04/2022 1459   LDLCALC 63 08/04/2022 1459    No BP recorded.  {Refresh Note OR Click here to enter BP  :1}***    ECG personally reviewed by me today- ***    Echocardiogram 05/30/2020  IMPRESSIONS     1. Left ventricular ejection fraction, by estimation, is 50 to 55%. The  left ventricle has low normal function. The left ventricle demonstrates  global hypokinesis. There is mild left ventricular hypertrophy. Left  ventricular diastolic parameters are  indeterminate.   2. Right ventricular systolic function is normal. The right ventricular  size is normal. Tricuspid regurgitation signal is inadequate for assessing  PA pressure.   3. Left atrial size was mildly dilated.   4. Right atrial size was mildly dilated.   5. The mitral valve is normal in structure. No evidence of mitral valve  regurgitation. No evidence of mitral stenosis.   6. The aortic valve is tricuspid. Aortic valve regurgitation is not  visualized. No aortic stenosis is present.   7. Aortic dilatation noted. There is mild dilatation of the ascending  aorta, measuring 41 mm.   8. The inferior vena cava is normal in size with greater than 50%  respiratory variability, suggesting right atrial pressure of 3 mmHg.   9. The patient was in atrial fibrillation.   FINDINGS   Left Ventricle: Left ventricular ejection fraction, by estimation, is 50  to 55%. The left ventricle has low normal function. The left ventricle  demonstrates global hypokinesis. The left ventricular internal cavity size  was normal in size. There is mild  left ventricular hypertrophy. Left ventricular diastolic parameters are  indeterminate.   Right Ventricle: The right ventricular size is normal. No increase in  right ventricular wall thickness. Right ventricular systolic function is  normal. Tricuspid regurgitation signal is inadequate for assessing PA  pressure.   Left Atrium: Left atrial size was mildly  dilated.   Right Atrium: Right atrial size was mildly dilated.   Pericardium: There is no evidence of pericardial effusion.   Mitral Valve: The mitral valve is normal in structure. There is mild  calcification of the mitral valve leaflet(s). Mild mitral annular  calcification. No evidence of mitral valve regurgitation.  No evidence of  mitral valve stenosis.   Tricuspid Valve: The tricuspid valve is normal in structure. Tricuspid  valve regurgitation is not demonstrated.   Aortic Valve: The aortic valve is tricuspid. Aortic valve regurgitation is  not visualized. No aortic stenosis is present.   Pulmonic Valve: The pulmonic valve was normal in structure. Pulmonic valve  regurgitation is trivial.   Aorta: Aortic dilatation noted. There is mild dilatation of the ascending  aorta, measuring 41 mm.   Venous: The inferior vena cava is normal in size with greater than 50%  respiratory variability, suggesting right atrial pressure of 3 mmHg.   IAS/Shunts: No atrial level shunt detected by color flow Doppler.       Assessment & Plan   1.  ***   Thomasene Ripple. Galadriel Shroff NP-C     07/24/2023, 2:30 PM [facility] [address] Office [phone] Fax [phone]    I spent***minutes examining this patient, reviewing medications, and using patient centered shared decision making involving their cardiac care.   I spent  20 minutes reviewing past medical history,  medications, and prior cardiac tests.

## 2023-07-27 ENCOUNTER — Encounter (HOSPITAL_BASED_OUTPATIENT_CLINIC_OR_DEPARTMENT_OTHER): Payer: Self-pay

## 2023-07-27 DIAGNOSIS — R262 Difficulty in walking, not elsewhere classified: Secondary | ICD-10-CM | POA: Diagnosis not present

## 2023-07-27 DIAGNOSIS — R2681 Unsteadiness on feet: Secondary | ICD-10-CM | POA: Diagnosis not present

## 2023-07-27 DIAGNOSIS — R2689 Other abnormalities of gait and mobility: Secondary | ICD-10-CM | POA: Diagnosis not present

## 2023-07-27 DIAGNOSIS — M6281 Muscle weakness (generalized): Secondary | ICD-10-CM | POA: Diagnosis not present

## 2023-07-29 ENCOUNTER — Ambulatory Visit: Admitting: General Practice

## 2023-07-29 DIAGNOSIS — R2681 Unsteadiness on feet: Secondary | ICD-10-CM | POA: Diagnosis not present

## 2023-07-29 DIAGNOSIS — M6281 Muscle weakness (generalized): Secondary | ICD-10-CM | POA: Diagnosis not present

## 2023-07-29 DIAGNOSIS — R2689 Other abnormalities of gait and mobility: Secondary | ICD-10-CM | POA: Diagnosis not present

## 2023-07-29 DIAGNOSIS — R262 Difficulty in walking, not elsewhere classified: Secondary | ICD-10-CM | POA: Diagnosis not present

## 2023-08-05 DIAGNOSIS — M6281 Muscle weakness (generalized): Secondary | ICD-10-CM | POA: Diagnosis not present

## 2023-08-05 DIAGNOSIS — R2681 Unsteadiness on feet: Secondary | ICD-10-CM | POA: Diagnosis not present

## 2023-08-05 DIAGNOSIS — R2689 Other abnormalities of gait and mobility: Secondary | ICD-10-CM | POA: Diagnosis not present

## 2023-08-05 DIAGNOSIS — H81399 Other peripheral vertigo, unspecified ear: Secondary | ICD-10-CM | POA: Diagnosis not present

## 2023-08-10 ENCOUNTER — Telehealth: Payer: Self-pay | Admitting: Student

## 2023-08-10 ENCOUNTER — Ambulatory Visit: Payer: Self-pay

## 2023-08-10 NOTE — Telephone Encounter (Signed)
 Copied from CRM [id number]. Topic: Appointments - Scheduling Inquiry for Clinic >> Aug 10, 2023 10:30 AM Hamdi H wrote: Reason for CRM: Patient's wife called in to schedule AWV/Physical for the patient. I was unable to schedule the AWV part of the physical. Please call the patients wife back to get this scheduled for them. [phone]  Please refer to message above.  Patient was contacted on 06/10/23 by Jenifer Miu to schedule AWV, but had to leave a message.  Forwarding message to her along with the practice administrator, Gordy Lauber, to schedule patient a future appointment.

## 2023-08-10 NOTE — Telephone Encounter (Signed)
 Chief Complaint: Fatigue, increased sleep Symptoms: fatigue, increased sleep, decreased appetite Frequency: 1 month Pertinent Negatives: Patient denies hx of stroke Disposition: [] ED /[] Urgent Care (no appt availability in office) / [x] Appointment(In office/virtual)/ []  [place] [facility]/ [] Home Care/ [] Refused Recommended Disposition /[] [place] [facility]/ []  Follow-up with PCP Additional Notes: Patient's wife called to make an appt for patient concerned his onset of extreme fatigue and increased sleep, stating he has been sleeping 12-16 hours a day. Patient's wife states this is not absolutely abnormal but has worsened, and the fatigue is new. Patient's wife states he has no appetite. This RN assessed diabetic glucose levels and patient's wife states he does not have ability to check this at home. This RN strongly encouraged getting glucose monitor asap and monitoring glucose, and calling us  back asap with any abnormal reading. Patient's wife states she notices a slight change in baseline mentation. Patient's wife states he sees physical therapy each week and PT stated last Wednesday that the patient was not tracking with his eyes but PT did a full evaluation of patient and assumingly (by wife) ruled out stroke. Patient advised to be seen today. Wife states she cannot bring him in until Wednesday. This RN encouraged urgent monitoring of glucose and to monitor for s/s of stroke and call 911 if symptoms present. Patient's wife verbalized understanding.   Copied from CRM [id number]. Topic: Clinical - Red Word Triage >> Aug 10, 2023  9:42 AM Hamdi H wrote: Red Word that prompted transfer to Nurse Triage: Patient has been having worsening loss of energy, and he sleeps 14-16 hours per day. Reason for Disposition  [1] MODERATE weakness (i.e., interferes with work, school, normal activities) AND [2] persists > 3 days  Answer Assessment - Initial Assessment Questions 1. DESCRIPTION: "Describe how you  are feeling."     Severe fatigue, increased sleeping 2. SEVERITY: "How bad is it?"  "Can you stand and walk?"   - MILD (0-3): Feels weak or tired, but does not interfere with work, school or normal activities.   - MODERATE (4-7): Able to stand and walk; weakness interferes with work, school, or normal activities.   - SEVERE (8-10): Unable to stand or walk; unable to do usual activities.     Patient able to walk around but does not get up to do activities often 3. ONSET: "When did these symptoms begin?" (e.g., hours, days, weeks, months)     A month 4. CAUSE: "What do you think is causing the weakness or fatigue?" (e.g., not drinking enough fluids, medical problem, trouble sleeping)     unsure 5. NEW MEDICINES:  "Have you started on any new medicines recently?" (e.g., opioid pain medicines, benzodiazepines, muscle relaxants, antidepressants, antihistamines, neuroleptics, beta blockers)     Not available on chart 6. OTHER SYMPTOMS: "Do you have any other symptoms?" (e.g., chest pain, fever, cough, SOB, vomiting, diarrhea, bleeding, other areas of pain)     Low appetite  Protocols used: Weakness (Generalized) and Fatigue-A-AH

## 2023-08-11 ENCOUNTER — Other Ambulatory Visit: Payer: Self-pay | Admitting: Student

## 2023-08-11 DIAGNOSIS — I10 Essential (primary) hypertension: Secondary | ICD-10-CM

## 2023-08-11 NOTE — Telephone Encounter (Signed)
 Called patient to schedule Medicare Annual Wellness Visit (AWV). Left message for patient to call back and schedule Medicare Annual Wellness Visit (AWV).  Last date of AWV: 05/30/2022   Please schedule an AWVS appointment at any time with [facility] VISIT.  If any questions, please contact me at [phone].   Thank you,  [facility] Support [facility] Direct dial  [phone]

## 2023-08-11 NOTE — Telephone Encounter (Signed)
 Medication sent to pharmacy

## 2023-08-12 ENCOUNTER — Ambulatory Visit: Payer: Self-pay | Admitting: Student

## 2023-08-12 ENCOUNTER — Other Ambulatory Visit: Payer: Self-pay | Admitting: Student

## 2023-08-12 VITALS — BP 128/95 | HR 75 | Temp 98.3°F | Ht 73.0 in | Wt 216.1 lb

## 2023-08-12 DIAGNOSIS — R5383 Other fatigue: Secondary | ICD-10-CM

## 2023-08-12 DIAGNOSIS — R630 Anorexia: Secondary | ICD-10-CM

## 2023-08-12 DIAGNOSIS — R2689 Other abnormalities of gait and mobility: Secondary | ICD-10-CM | POA: Diagnosis not present

## 2023-08-12 DIAGNOSIS — M6281 Muscle weakness (generalized): Secondary | ICD-10-CM | POA: Diagnosis not present

## 2023-08-12 DIAGNOSIS — N1832 Chronic kidney disease, stage 3b: Secondary | ICD-10-CM | POA: Diagnosis not present

## 2023-08-12 DIAGNOSIS — Z1321 Encounter for screening for nutritional disorder: Secondary | ICD-10-CM

## 2023-08-12 DIAGNOSIS — E1122 Type 2 diabetes mellitus with diabetic chronic kidney disease: Secondary | ICD-10-CM

## 2023-08-12 DIAGNOSIS — R3915 Urgency of urination: Secondary | ICD-10-CM

## 2023-08-12 DIAGNOSIS — R531 Weakness: Secondary | ICD-10-CM

## 2023-08-12 DIAGNOSIS — D509 Iron deficiency anemia, unspecified: Secondary | ICD-10-CM | POA: Diagnosis not present

## 2023-08-12 DIAGNOSIS — R2681 Unsteadiness on feet: Secondary | ICD-10-CM | POA: Diagnosis not present

## 2023-08-12 DIAGNOSIS — H81399 Other peripheral vertigo, unspecified ear: Secondary | ICD-10-CM | POA: Diagnosis not present

## 2023-08-12 LAB — GLUCOSE, CAPILLARY: Glucose-Capillary: 133 mg/dL — ABNORMAL HIGH (ref 70–99)

## 2023-08-12 NOTE — Telephone Encounter (Signed)
 Medication sent to pharmacy

## 2023-08-12 NOTE — Patient Instructions (Addendum)
 Thank you, Mr.Taquan Bong for allowing us  to provide your care today. Today we discussed   -Fatigue/Sleep: check blood work today and I will call with results -Decrease Trazodone to 50 mg (1/2 tablet) at night  -Has follow up visit in 1 month    -Continue working with physical therapy   I have ordered the following labs for you: Lab Orders         Glucose, capillary         BMP8+Anion Gap         CBC no Diff         TSH         Vitamin B12         Magnesium         Microalbumin / Creatinine Urine Ratio         Vitamin D (25 hydroxy)       Follow up:  1 month    Should you have any questions or concerns please call the internal medicine clinic at [phone].    Montgomery Rothlisberger, D.O. [facility]

## 2023-08-12 NOTE — Progress Notes (Unsigned)
 CC: acute visit, worsening fatigue and poor appetite  HPI:  Jonathon [name] is a 73 y.o. male living with a history stated below and presents today for acute visit, worsening fatigue and poor appetite. Please see problem based assessment and plan for additional details.  Past Medical History:  Diagnosis Date   Anemia due to blood loss, acute 2014   2nd GIB   Anxiety    CKD (chronic kidney disease), stage III (HCC) 2014   Diabetes mellitus without complication (HCC)    Gout    High cholesterol    Hypertension 2014   Hypothyroid    Neuropathy    PUD (peptic ulcer disease) 2014    Current Outpatient Medications on File Prior to Visit  Medication Sig Dispense Refill   albuterol (VENTOLIN HFA) 108 (90 Base) MCG/ACT inhaler Inhale 2 puffs into the lungs every 6 (six) hours as needed for wheezing or shortness of breath. 8 g 2   amLODipine (NORVASC) 5 MG tablet Take 1 tablet (5 mg total) by mouth daily. 30 tablet 11   ammonium lactate (AMLACTIN DAILY) 12 % lotion Apply 1 Application topically as needed. 400 g 0   aspirin EC 81 MG EC tablet Take 1 tablet (81 mg total) by mouth daily.     atorvastatin (LIPITOR) 80 MG tablet TAKE 1 TABLET BY MOUTH EVERYDAY AT BEDTIME 90 tablet 2   ciprofloxacin (CIPRO) 500 MG tablet Take 1 tablet (500 mg total) by mouth 2 (two) times daily. 14 tablet 0   fenofibrate 54 MG tablet TAKE 1 TABLET(54 MG) BY MOUTH DAILY 90 tablet 1   finasteride (PROSCAR) 5 MG tablet TAKE 1 TABLET (5 MG TOTAL) BY MOUTH DAILY. 90 tablet 3   levothyroxine (SYNTHROID) 112 MCG tablet Take 1 tablet (112 mcg total) by mouth at bedtime. 90 tablet 3   Magnesium 250 MG TABS Take 1 tablet (250 mg total) by mouth daily. 30 tablet 0   metoprolol succinate (TOPROL-XL) 25 MG 24 hr tablet TAKE 1 TABLET (25 MG TOTAL) BY MOUTH DAILY. 90 tablet 3   pantoprazole (PROTONIX) 40 MG tablet TAKE 1 TABLET BY MOUTH EVERY DAY 90 tablet 1   pregabalin (LYRICA) 75 MG capsule Take 2 capsules (150 mg  total) by mouth at bedtime. 180 capsule 1   sertraline (ZOLOFT) 100 MG tablet Take 1 tablet (100 mg total) by mouth daily. 90 tablet 4   solifenacin (VESICARE) 10 MG tablet Take 1 tablet (10 mg total) by mouth daily. 30 tablet 11   traZODone (DESYREL) 100 MG tablet TAKE 1 TABLET BY MOUTH EVERYDAY AT BEDTIME 90 tablet 1   XARELTO 20 MG TABS tablet TAKE 1 TABLET BY MOUTH EVERY DAY 60 tablet 5   No current facility-administered medications on file prior to visit.    Family History  Problem Relation Age of Onset   Diabetes Mellitus II Maternal Aunt    Lung cancer Mother    Pancreatic cancer Sister     Social History   Socioeconomic History   Marital status: Married    Spouse name: Not on file   Number of children: Not on file   Years of education: Not on file   Highest education level: Not on file  Occupational History   Occupation: banking    Comment: retired  Tobacco Use   Smoking status: Former    Current packs/day: 2.00    Average packs/day: 2.0 packs/day for 3.0 years (6.0 ttl pk-yrs)    Types: Cigarettes   Smokeless  tobacco: Former    Quit date: 05/08/1967  Vaping Use   Vaping status: Never Used  Substance and Sexual Activity   Alcohol use: Yes    Alcohol/week: 14.0 standard drinks of alcohol    Types: 14 Shots of liquor per week    Comment: daily   Drug use: Yes    Types: Marijuana   Sexual activity: Not on file  Other Topics Concern   Not on file  Social History Narrative   Not on file   Social Drivers of Health   Financial Resource Strain: Low Risk  (05/30/2022)   Overall Financial Resource Strain (CARDIA)    Difficulty of Paying Living Expenses: Not hard at all  Food Insecurity: No Food Insecurity (05/30/2022)   Hunger Vital Sign    Worried About Running Out of Food in the Last Year: Never true    Ran Out of Food in the Last Year: Never true  Transportation Needs: No Transportation Needs (05/30/2022)   PRAPARE - Administrator, Civil Service  (Medical): No    Lack of Transportation (Non-Medical): No  Physical Activity: Inactive (05/30/2022)   Exercise Vital Sign    Days of Exercise per Week: 0 days    Minutes of Exercise per Session: 0 min  Stress: No Stress Concern Present (05/30/2022)   [employer] of Occupational Health - Occupational Stress Questionnaire    Feeling of Stress : Not at all  Social Connections: Moderately Isolated (05/30/2022)   Social Connection and Isolation Panel [NHANES]    Frequency of Communication with Friends and Family: Twice a week    Frequency of Social Gatherings with Friends and Family: Once a week    Attends Religious Services: Never    Database administrator or Organizations: No    Attends Banker Meetings: Never    Marital Status: Married  Catering manager Violence: Not At Risk (05/30/2022)   Humiliation, Afraid, Rape, and Kick questionnaire    Fear of Current or Ex-Partner: No    Emotionally Abused: No    Physically Abused: No    Sexually Abused: No    Review of Systems: ROS negative except for what is noted on the assessment and plan.  Vitals:   08/12/23 1504  BP: (!) 128/95  Pulse: 75  Temp: 98.3 F (36.8 C)  TempSrc: Oral  SpO2: 98%  Weight: 216 lb 1.6 oz (98 kg)  Height: 6\' 1"  (1.854 m)   Physical Exam: Constitutional: alert, sitting in wheelchair comfortably, in no acute distress HENT: normocephalic atraumatic Cardiovascular: irregular rhythm, normal rate Pulmonary/Chest: normal work of breathing on room air, lungs clear to auscultation bilaterally Abdominal: soft, non-tender, non-distended MSK: no LE edema Neurological: alert & oriented x 3, CN II-XII intact, 4+/5 strength in all extremities Skin: warm and dry, no new skin lesions Psych: normal mood and affect  Assessment & Plan:   Generalized weakness Wife present, reports excessive sleeping fro 14-16 hours for past month. Prior he was sleeping about 12 hours (midnight to noon). Endorses chronic  fatigue and generalized weakness for several months. Along with poor appetite due to " taste buds not working" with associated weight loss. No acute mood changes per patient. Denies fever or recent infection or sick contacts. No recent medication changes. Exam today without significant findings. Vitals appear stable today. Sounds like chronic fatigue for months. Works with PT still. 2023 had colonoscopy with polyps without malignant findings with repeat in 3 years. No acute concerns there.  Will obtain labs today to assess anemia, electrolytes, hypothyroidism. Work on decreasing sedating medications ie trazodone.   Plan -CBC, BMP, vitamin B12 and D, TSH today  -Decrease trazodone to 50 mg at bedtime  -Continue with PT   Hypomagnesemia Last mag level low 1.6. Still taking magnesium supplement.   Plan -Recheck mag today    Patient discussed with Dr. Barbara Levins, D.O. [facility] Internal Medicine, PGY-2 Phone: [phone] Date 08/12/2023 Time 3:39 PM

## 2023-08-13 ENCOUNTER — Encounter: Payer: Self-pay | Admitting: Student

## 2023-08-13 DIAGNOSIS — R630 Anorexia: Secondary | ICD-10-CM | POA: Insufficient documentation

## 2023-08-13 MED ORDER — VITAMIN D3 1000 UNITS PO CAPS: 1.0000 | ORAL_CAPSULE | Freq: Every day | ORAL | 2 refills | Status: DC

## 2023-08-13 NOTE — Addendum Note (Signed)
 Addended by: Jearldine Mina on: 08/13/2023 04:40 PM   Modules accepted: Orders

## 2023-08-13 NOTE — Progress Notes (Signed)
 Internal Medicine Clinic Attending  Case discussed with the resident at the time of the visit.  We reviewed the resident's history and exam and pertinent patient test results.  I agree with the assessment, diagnosis, and plan of care documented in the resident's note.    I agree with plan to decrease Trazodone dose, as patient is experiencing sleepiness Will replete his vitamin D  He has follow up in a few weeks. Repeat BMP then.

## 2023-08-13 NOTE — Assessment & Plan Note (Signed)
 Last mag level low 1.6. Still taking magnesium supplement.   Plan -Recheck mag today

## 2023-08-13 NOTE — Assessment & Plan Note (Addendum)
 Wife present, reports excessive sleeping fro 14-16 hours for past month. Prior he was sleeping about 12 hours (midnight to noon). Endorses chronic fatigue and generalized weakness for several months. Along with poor appetite due to " taste buds not working" with associated weight loss. No acute mood changes per patient. Denies fever or recent infection or sick contacts. No recent medication changes. Exam today without significant findings. Vitals appear stable today. Sounds like chronic fatigue for months. Works with PT still. Hx of OSA on CPAP but lost weight and been off for over 15 years, low stop-bang score. 2023 had colonoscopy for GIB, found polyps without malignant pathology with repeat in 3 years. No acute concerns there.   Will obtain labs today to assess anemia, electrolytes, hypothyroidism. Work on decreasing sedating medications ie trazodone.   Plan -CBC, BMP, vitamin B12 and D, TSH today  -Decrease trazodone to 50 mg at bedtime  -Continue with PT  -F/u visit in 1 month

## 2023-08-14 LAB — BMP8+ANION GAP
Anion Gap: 16 mmol/L (ref 10.0–18.0)
BUN/Creatinine Ratio: 9 — ABNORMAL LOW (ref 10–24)
BUN: 13 mg/dL (ref 8–27)
CO2: 27 mmol/L (ref 20–29)
Calcium: 10.1 mg/dL (ref 8.6–10.2)
Chloride: 95 mmol/L — ABNORMAL LOW (ref 96–106)
Creatinine, Ser: 1.52 mg/dL — ABNORMAL HIGH (ref 0.76–1.27)
Glucose: 123 mg/dL — ABNORMAL HIGH (ref 70–99)
Potassium: 3.6 mmol/L (ref 3.5–5.2)
Sodium: 138 mmol/L (ref 134–144)
eGFR: 48 mL/min/{1.73_m2} — ABNORMAL LOW (ref >59)

## 2023-08-14 LAB — CBC
Hematocrit: 53.7 % — ABNORMAL HIGH (ref 37.5–51.0)
Hemoglobin: 17.4 g/dL (ref 13.0–17.7)
MCH: 30.3 pg (ref 26.6–33.0)
MCHC: 32.4 g/dL (ref 31.5–35.7)
MCV: 93 fL (ref 79–97)
Platelets: 127 10*3/uL — ABNORMAL LOW (ref 150–450)
RBC: 5.75 x10E6/uL (ref 4.14–5.80)
RDW: 13.1 % (ref 11.6–15.4)
WBC: 6.3 10*3/uL (ref 3.4–10.8)

## 2023-08-14 LAB — VITAMIN D 25 HYDROXY (VIT D DEFICIENCY, FRACTURES): Vit D, 25-Hydroxy: 15.9 ng/mL — ABNORMAL LOW (ref 30.0–100.0)

## 2023-08-14 LAB — TSH: TSH: 1.61 u[IU]/mL (ref 0.450–4.500)

## 2023-08-14 LAB — MAGNESIUM: Magnesium: 1.4 mg/dL — ABNORMAL LOW (ref 1.6–2.3)

## 2023-08-14 LAB — VITAMIN B12: Vitamin B-12: 488 pg/mL (ref 232–1245)

## 2023-08-17 DIAGNOSIS — M6281 Muscle weakness (generalized): Secondary | ICD-10-CM | POA: Diagnosis not present

## 2023-08-17 DIAGNOSIS — R2689 Other abnormalities of gait and mobility: Secondary | ICD-10-CM | POA: Diagnosis not present

## 2023-08-17 DIAGNOSIS — H81399 Other peripheral vertigo, unspecified ear: Secondary | ICD-10-CM | POA: Diagnosis not present

## 2023-08-17 DIAGNOSIS — R2681 Unsteadiness on feet: Secondary | ICD-10-CM | POA: Diagnosis not present

## 2023-08-19 DIAGNOSIS — H81399 Other peripheral vertigo, unspecified ear: Secondary | ICD-10-CM | POA: Diagnosis not present

## 2023-08-19 DIAGNOSIS — R2681 Unsteadiness on feet: Secondary | ICD-10-CM | POA: Diagnosis not present

## 2023-08-19 DIAGNOSIS — M6281 Muscle weakness (generalized): Secondary | ICD-10-CM | POA: Diagnosis not present

## 2023-08-19 DIAGNOSIS — R2689 Other abnormalities of gait and mobility: Secondary | ICD-10-CM | POA: Diagnosis not present

## 2023-08-19 MED ORDER — TRAZODONE HCL 100 MG PO TABS: 50.0000 mg | ORAL_TABLET | Freq: Every day | ORAL | Status: DC

## 2023-08-19 NOTE — Addendum Note (Signed)
 Addended by: Jearldine Mina on: 08/19/2023 09:42 AM   Modules accepted: Orders

## 2023-08-21 ENCOUNTER — Ambulatory Visit: Admitting: Podiatry

## 2023-08-24 DIAGNOSIS — R2689 Other abnormalities of gait and mobility: Secondary | ICD-10-CM | POA: Diagnosis not present

## 2023-08-24 DIAGNOSIS — M6281 Muscle weakness (generalized): Secondary | ICD-10-CM | POA: Diagnosis not present

## 2023-08-24 DIAGNOSIS — H81399 Other peripheral vertigo, unspecified ear: Secondary | ICD-10-CM | POA: Diagnosis not present

## 2023-08-24 DIAGNOSIS — R2681 Unsteadiness on feet: Secondary | ICD-10-CM | POA: Diagnosis not present

## 2023-08-31 ENCOUNTER — Encounter: Payer: Self-pay | Admitting: Student

## 2023-08-31 ENCOUNTER — Ambulatory Visit (HOSPITAL_COMMUNITY)
Admission: RE | Admit: 2023-08-31 | Discharge: 2023-08-31 | Disposition: A | Discharge status: Home / Self Care | Source: Ambulatory Visit | Attending: Internal Medicine | Admitting: Internal Medicine

## 2023-08-31 ENCOUNTER — Ambulatory Visit (INDEPENDENT_AMBULATORY_CARE_PROVIDER_SITE_OTHER): Payer: Self-pay | Admitting: Student

## 2023-08-31 VITALS — BP 114/76 | HR 62 | Temp 98.2°F | Ht 73.0 in | Wt 215.4 lb

## 2023-08-31 DIAGNOSIS — R531 Weakness: Secondary | ICD-10-CM

## 2023-08-31 DIAGNOSIS — D751 Secondary polycythemia: Secondary | ICD-10-CM | POA: Diagnosis not present

## 2023-08-31 DIAGNOSIS — N1831 Chronic kidney disease, stage 3a: Secondary | ICD-10-CM

## 2023-08-31 DIAGNOSIS — Z1211 Encounter for screening for malignant neoplasm of colon: Secondary | ICD-10-CM | POA: Insufficient documentation

## 2023-08-31 DIAGNOSIS — E1122 Type 2 diabetes mellitus with diabetic chronic kidney disease: Secondary | ICD-10-CM | POA: Diagnosis not present

## 2023-08-31 DIAGNOSIS — I480 Paroxysmal atrial fibrillation: Secondary | ICD-10-CM | POA: Insufficient documentation

## 2023-08-31 DIAGNOSIS — I4891 Unspecified atrial fibrillation: Secondary | ICD-10-CM

## 2023-08-31 DIAGNOSIS — I10 Essential (primary) hypertension: Secondary | ICD-10-CM

## 2023-08-31 DIAGNOSIS — M6281 Muscle weakness (generalized): Secondary | ICD-10-CM | POA: Diagnosis not present

## 2023-08-31 DIAGNOSIS — E785 Hyperlipidemia, unspecified: Secondary | ICD-10-CM | POA: Diagnosis not present

## 2023-08-31 DIAGNOSIS — E7849 Other hyperlipidemia: Secondary | ICD-10-CM | POA: Diagnosis not present

## 2023-08-31 DIAGNOSIS — I129 Hypertensive chronic kidney disease with stage 1 through stage 4 chronic kidney disease, or unspecified chronic kidney disease: Secondary | ICD-10-CM | POA: Diagnosis not present

## 2023-08-31 DIAGNOSIS — R2689 Other abnormalities of gait and mobility: Secondary | ICD-10-CM | POA: Diagnosis not present

## 2023-08-31 DIAGNOSIS — H81399 Other peripheral vertigo, unspecified ear: Secondary | ICD-10-CM | POA: Diagnosis not present

## 2023-08-31 DIAGNOSIS — R2681 Unsteadiness on feet: Secondary | ICD-10-CM | POA: Diagnosis not present

## 2023-08-31 DIAGNOSIS — N183 Chronic kidney disease, stage 3 unspecified: Secondary | ICD-10-CM

## 2023-08-31 LAB — POCT GLYCOSYLATED HEMOGLOBIN (HGB A1C): Hemoglobin A1C: 5.6 % (ref 4.0–5.6)

## 2023-08-31 LAB — CBC WITH DIFFERENTIAL/PLATELET
Abs Immature Granulocytes: 0.01 10*3/uL (ref 0.00–0.07)
Basophils Absolute: 0 10*3/uL (ref 0.0–0.1)
Basophils Relative: 1 %
Eosinophils Absolute: 0 10*3/uL (ref 0.0–0.5)
Eosinophils Relative: 0 %
HCT: 50.1 % (ref 39.0–52.0)
Hemoglobin: 16.7 g/dL (ref 13.0–17.0)
Immature Granulocytes: 0 %
Lymphocytes Relative: 19 %
Lymphs Abs: 0.9 10*3/uL (ref 0.7–4.0)
MCH: 30.6 pg (ref 26.0–34.0)
MCHC: 33.3 g/dL (ref 30.0–36.0)
MCV: 91.9 fL (ref 80.0–100.0)
Monocytes Absolute: 0.3 10*3/uL (ref 0.1–1.0)
Monocytes Relative: 6 %
Neutro Abs: 3.6 10*3/uL (ref 1.7–7.7)
Neutrophils Relative %: 74 %
Platelets: 111 10*3/uL — ABNORMAL LOW (ref 150–400)
RBC: 5.45 MIL/uL (ref 4.22–5.81)
RDW: 14.2 % (ref 11.5–15.5)
WBC: 4.8 10*3/uL (ref 4.0–10.5)
nRBC: 0 % (ref 0.0–0.2)

## 2023-08-31 LAB — TECHNOLOGIST SMEAR REVIEW: Plt Morphology: NORMAL

## 2023-08-31 LAB — GLUCOSE, CAPILLARY: Glucose-Capillary: 191 mg/dL — ABNORMAL HIGH (ref 70–99)

## 2023-08-31 NOTE — Patient Instructions (Addendum)
 Bronze, Dollens you for allowing me to take part in your care today.  Here are your instructions.  1.  I am going to check multiple labs on you.  I will call you with results.  2.  I am scheduling you for an ultrasound of your heart.  Please await phone call to be scheduled.  3.  Please follow-up with your cardiologist outpatient.  4.  I checked your EKG today.  It showed that that you are in atrial fibrillation, but it is controlled.  5.  Please come back in 1 month and we can evaluate your fatigue again.  Thank you, Dr. Lydia Sams  If you have any other questions please contact the internal medicine clinic at [phone] If it is after hours, please call the [facility] at [phone] and then ask the person who picks up for the resident on call.

## 2023-08-31 NOTE — Progress Notes (Signed)
 CC: Follow up appointment   HPI:  Jonathon [name] is a 73 y.o. male with a past medical history of atrial fibrillation, heart failure with recovered ejection fraction, hypertension, PAD, diabetes, CKD stage III who presents for follow-up appointment.  Please see assessment and plan for full HPI.  Medications: Hypertension: Amlodipine  5 mg daily CAD: Aspirin  81 mg daily, Lipitor  80 mg daily Vitamin D  deficiency: Vitamin D  1000 units daily Hyperlipidemia: Fenofibrate  54 mg daily, atorvastatin  80 mg daily BPH: Finasteride  5 mg daily, Vesicare  10 mg daily, tamsulosin  0.4 mg daily, not doing flomax  Hypothyroidism: Levothyroxine  112 mcg daily Hypomagnesemia: Magnesium  supplementation 250 mg daily HFrecEF: Metoprolol  succinate 25 mg daily GERD: Protonix  40 mg daily Neuropathy: Lyrica  150 milligrams nightly Depression: Zoloft  100 mg daily Insomnia: Trazodone  50 mg nightly Atrial fibrillation: Xarelto  20 mg daily, metoprolol  succinate 25 mg daily  Past Medical History:  Diagnosis Date   Anemia due to blood loss, acute 2014   2nd GIB   Anxiety    CKD (chronic kidney disease), stage III (HCC) 2014   Diabetes mellitus without complication (HCC)    Gout    High cholesterol    Hypertension 2014   Hypothyroid    Neuropathy    PUD (peptic ulcer disease) 2014     Current Outpatient Medications:    amLODipine  (NORVASC ) 5 MG tablet, Take 1 tablet (5 mg total) by mouth daily., Disp: 30 tablet, Rfl: 11   ammonium lactate  (AMLACTIN DAILY) 12 % lotion, Apply 1 Application topically as needed., Disp: 400 g, Rfl: 0   aspirin  EC 81 MG EC tablet, Take 1 tablet (81 mg total) by mouth daily., Disp:  , Rfl:    atorvastatin  (LIPITOR ) 80 MG tablet, TAKE 1 TABLET BY MOUTH EVERYDAY AT BEDTIME, Disp: 90 tablet, Rfl: 2   Cholecalciferol (VITAMIN D3) 1000 units CAPS, Take 1 capsule by mouth daily., Disp: 30 capsule, Rfl: 2   fenofibrate  54 MG tablet, TAKE 1 TABLET(54 MG) BY MOUTH DAILY, Disp: 90  tablet, Rfl: 1   finasteride  (PROSCAR ) 5 MG tablet, TAKE 1 TABLET (5 MG TOTAL) BY MOUTH DAILY., Disp: 90 tablet, Rfl: 3   levothyroxine  (SYNTHROID ) 112 MCG tablet, Take 1 tablet (112 mcg total) by mouth at bedtime., Disp: 90 tablet, Rfl: 3   Magnesium  250 MG TABS, Take 1 tablet (250 mg total) by mouth daily., Disp: 30 tablet, Rfl: 0   metoprolol  succinate (TOPROL -XL) 25 MG 24 hr tablet, TAKE 1 TABLET (25 MG TOTAL) BY MOUTH DAILY., Disp: 90 tablet, Rfl: 3   pantoprazole  (PROTONIX ) 40 MG tablet, TAKE 1 TABLET BY MOUTH EVERY DAY, Disp: 90 tablet, Rfl: 1   pregabalin  (LYRICA ) 75 MG capsule, Take 2 capsules (150 mg total) by mouth at bedtime., Disp: 180 capsule, Rfl: 1   sertraline  (ZOLOFT ) 100 MG tablet, Take 1 tablet (100 mg total) by mouth daily., Disp: 90 tablet, Rfl: 4   solifenacin  (VESICARE ) 10 MG tablet, Take 1 tablet (10 mg total) by mouth daily., Disp: 30 tablet, Rfl: 11   tamsulosin  (FLOMAX ) 0.4 MG CAPS capsule, TAKE 1 CAPSULE BY MOUTH EVERY DAY, Disp: 90 capsule, Rfl: 1   traZODone  (DESYREL ) 100 MG tablet, Take 0.5 tablets (50 mg total) by mouth at bedtime., Disp: , Rfl:    XARELTO  20 MG TABS tablet, TAKE 1 TABLET BY MOUTH EVERY DAY, Disp: 60 tablet, Rfl: 5  Review of Systems:    Negative except for what is stated in HPI.   Physical Exam:  Vitals:   08/31/23  1509  BP: 114/76  Pulse: 62  Temp: 98.2 F (36.8 C)  TempSrc: Oral  SpO2: 99%  Weight: 215 lb 6.4 oz (97.7 kg)  Height: 6\' 1"  (1.854 m)   General: Patient is sitting comfortably in the room  Head: Normocephalic, atraumatic  Cardio: Regular rate and rhythm, no murmurs, rubs or gallops Pulmonary: Clear to ausculation bilaterally with no rales, rhonchi, and crackles    Assessment & Plan:   Generalized weakness Patient presents for follow-up appointment for the generalized weakness.  His wife reports he is still sleeping 14 to 16 hours a day.  He has tried to cut the trazodone  in half no improvement.  He reports he is  just very sleepy and does not like to wake up.  He goes to sleep at 1 AM and wakes up at 3 PM the next day.  He is taking his vitamin D  supplementation now.  He is taking magnesium  supplementation now.  Am trying to figure out why patient is very sleepy and very fatigued.  I do wonder if his heart failure has become worse.  I do wonder if his A-fib has been continuous to the point where he has now developed a tachyarrhythmia induced heart failure with reduced ejection fraction.  Will evaluate with EKG today.  Will also evaluate with echocardiogram.  Do recommend patient go to his cardiologist.  Plan: - Follow-up magnesium  level - Continue vitamin D  supplementation - Continue physical therapy - Follow-up in 1 month  Atrial fibrillation (HCC) On my exam, patient does have regular rate and rhythm.  Did obtain EKG which showed atrial fibrillation.  No acute concern for RVR.  Plan: - Continue Xarelto  20 mg daily - Continue metoprolol  succinate 25 mg daily  Essential hypertension Patient has a past medical history of hypertension.  Medication currently includes amlodipine  5 mg daily as well as metoprolol  succinate 25 mg daily.  Blood pressure today is 114/76.  Patient denies any headache, lightheadedness, vision changes, or chest pains.  Plan: - Continue amlodipine  5 mg daily  Type 2 diabetes mellitus with diabetic chronic kidney disease (HCC) Patient has a past medical history of well-controlled diabetes.  A1c today is 5.6.  No acute concerns at this time. Wanted to obtain ACR today but patient was unable to void.  Will obtain BMP today as well.  Plan: - Diabetes well-controlled - Follow-up BMP - Continue lifestyle modifications  Chronic kidney disease, stage 3 unspecified (HCC) At baseline.  Avoid nephrotoxic agents.  Hyperlipidemia Will check his lipid panel today.    Polycythemia Patient has previously had multiple Cbc checks that has shown elevated HCT and hemoglobin. He has been  a previous tobacco user but is not using anymore. He does not have a history of heme issues in the past. He has been worked up for this in the past with normal EPO levels. Will check a Jak level today and will check smear today.  Plan: -Follow up Smear  -Follow up Jak2 levels   Screening for colon cancer Patient reports that he went through colonoscopies in the past and he did not have a good experience and would to try something else. He states that his colonoscopy in the past was complicated by him having bleeding.  Plan: -Cologuard, if positive, will need colonoscopy   Patient discussed with Dr.  Sonia Durand, DO PGY-2 Internal Medicine Resident

## 2023-08-31 NOTE — Assessment & Plan Note (Addendum)
 Patient has a past medical history of well-controlled diabetes.  A1c today is 5.6.  No acute concerns at this time. Wanted to obtain ACR today but patient was unable to void.  Will obtain BMP today as well.  Plan: - Diabetes well-controlled - Follow-up BMP - Continue lifestyle modifications

## 2023-08-31 NOTE — Assessment & Plan Note (Addendum)
 On my exam, patient does have regular rate and rhythm.  Did obtain EKG which showed atrial fibrillation.  No acute concern for RVR.  Plan: - Continue Xarelto  20 mg daily - Continue metoprolol  succinate 25 mg daily

## 2023-08-31 NOTE — Assessment & Plan Note (Addendum)
 Patient has previously had multiple Cbc checks that has shown elevated HCT and hemoglobin. He has been a previous tobacco user but is not using anymore. He does not have a history of heme issues in the past. He has been worked up for this in the past with normal EPO levels. Will check a Jak level today and will check smear today.  Plan: -Follow up Smear  -Follow up Jak2 levels

## 2023-08-31 NOTE — Assessment & Plan Note (Signed)
 At baseline  Avoid nephrotoxic agents

## 2023-08-31 NOTE — Assessment & Plan Note (Addendum)
 Patient presents for follow-up appointment for the generalized weakness.  His wife reports he is still sleeping 14 to 16 hours a day.  He has tried to cut the trazodone  in half no improvement.  He reports he is just very sleepy and does not like to wake up.  He goes to sleep at 1 AM and wakes up at 3 PM the next day.  He is taking his vitamin D  supplementation now.  He is taking magnesium  supplementation now.  Am trying to figure out why patient is very sleepy and very fatigued.  I do wonder if his heart failure has become worse.  I do wonder if his A-fib has been continuous to the point where he has now developed a tachyarrhythmia induced heart failure with reduced ejection fraction.  Will evaluate with EKG today.  Will also evaluate with echocardiogram.  Do recommend patient go to his cardiologist.  Plan: - Follow-up magnesium  level - Continue vitamin D  supplementation - Continue physical therapy - Follow-up in 1 month

## 2023-08-31 NOTE — Assessment & Plan Note (Signed)
 Patient has a past medical history of hypertension.  Medication currently includes amlodipine  5 mg daily as well as metoprolol  succinate 25 mg daily.  Blood pressure today is 114/76.  Patient denies any headache, lightheadedness, vision changes, or chest pains.  Plan: - Continue amlodipine  5 mg daily

## 2023-08-31 NOTE — Assessment & Plan Note (Signed)
 Patient reports that he went through colonoscopies in the past and he did not have a good experience and would to try something else. He states that his colonoscopy in the past was complicated by him having bleeding.  Plan: -Cologuard, if positive, will need colonoscopy

## 2023-08-31 NOTE — Assessment & Plan Note (Signed)
Will check his lipid panel today 

## 2023-09-01 LAB — CMP14 + ANION GAP
ALT: 17 IU/L (ref 0–44)
AST: 25 IU/L (ref 0–40)
Albumin: 4.4 g/dL (ref 3.8–4.8)
Alkaline Phosphatase: 48 IU/L (ref 44–121)
Anion Gap: 14 mmol/L (ref 10.0–18.0)
BUN/Creatinine Ratio: 10 (ref 10–24)
BUN: 15 mg/dL (ref 8–27)
Bilirubin Total: 0.8 mg/dL (ref 0.0–1.2)
CO2: 26 mmol/L (ref 20–29)
Calcium: 9.5 mg/dL (ref 8.6–10.2)
Chloride: 98 mmol/L (ref 96–106)
Creatinine, Ser: 1.45 mg/dL — ABNORMAL HIGH (ref 0.76–1.27)
Globulin, Total: 1.8 g/dL (ref 1.5–4.5)
Glucose: 117 mg/dL — ABNORMAL HIGH (ref 70–99)
Potassium: 3.8 mmol/L (ref 3.5–5.2)
Sodium: 138 mmol/L (ref 134–144)
Total Protein: 6.2 g/dL (ref 6.0–8.5)
eGFR: 51 mL/min/{1.73_m2} — ABNORMAL LOW (ref >59)

## 2023-09-01 LAB — LIPID PANEL
Chol/HDL Ratio: 3 ratio (ref 0.0–5.0)
Cholesterol, Total: 112 mg/dL (ref 100–199)
HDL: 37 mg/dL — ABNORMAL LOW (ref >39)
LDL Chol Calc (NIH): 54 mg/dL (ref 0–99)
Triglycerides: 115 mg/dL (ref 0–149)
VLDL Cholesterol Cal: 21 mg/dL (ref 5–40)

## 2023-09-01 LAB — MAGNESIUM: Magnesium: 1.5 mg/dL — ABNORMAL LOW (ref 1.6–2.3)

## 2023-09-01 LAB — JAK2 V617F RFX CALR/MPL/E12-15

## 2023-09-01 NOTE — Progress Notes (Signed)
 Internal Medicine Clinic Attending  Case discussed with the resident at the time of the visit.  We reviewed the resident's history and exam and pertinent patient test results.  I agree with the assessment, diagnosis, and plan of care documented in the resident's note. 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  Fatigue could be from MDS or other marrow clonal process.  Additionally hasn't had an echo inf 3 years and remains in afib with hx of HFrEF with recovery will need to repeat and see if any changes that could explain his increasing early fatigue.  He is already in physical therapy and has been evaluated with a sleep study which demonstrated he no longer needed CPAP therapy due to weight loss although he is likely coming due for a repeat study especially if this initial workup does not explain symptoms.

## 2023-09-02 ENCOUNTER — Encounter: Payer: Self-pay | Admitting: Student

## 2023-09-02 DIAGNOSIS — M6281 Muscle weakness (generalized): Secondary | ICD-10-CM | POA: Diagnosis not present

## 2023-09-02 DIAGNOSIS — R2689 Other abnormalities of gait and mobility: Secondary | ICD-10-CM | POA: Diagnosis not present

## 2023-09-02 DIAGNOSIS — R2681 Unsteadiness on feet: Secondary | ICD-10-CM | POA: Diagnosis not present

## 2023-09-02 DIAGNOSIS — H81399 Other peripheral vertigo, unspecified ear: Secondary | ICD-10-CM | POA: Diagnosis not present

## 2023-09-03 ENCOUNTER — Encounter: Payer: Self-pay | Admitting: Emergency Medicine

## 2023-09-03 ENCOUNTER — Ambulatory Visit: Attending: Emergency Medicine | Admitting: Emergency Medicine

## 2023-09-03 VITALS — BP 110/68 | HR 78 | Ht 72.0 in | Wt 213.0 lb

## 2023-09-03 DIAGNOSIS — I4811 Longstanding persistent atrial fibrillation: Secondary | ICD-10-CM | POA: Insufficient documentation

## 2023-09-03 DIAGNOSIS — E119 Type 2 diabetes mellitus without complications: Secondary | ICD-10-CM | POA: Insufficient documentation

## 2023-09-03 DIAGNOSIS — E785 Hyperlipidemia, unspecified: Secondary | ICD-10-CM | POA: Insufficient documentation

## 2023-09-03 DIAGNOSIS — I739 Peripheral vascular disease, unspecified: Secondary | ICD-10-CM | POA: Insufficient documentation

## 2023-09-03 DIAGNOSIS — I10 Essential (primary) hypertension: Secondary | ICD-10-CM | POA: Insufficient documentation

## 2023-09-03 DIAGNOSIS — I5042 Chronic combined systolic (congestive) and diastolic (congestive) heart failure: Secondary | ICD-10-CM | POA: Diagnosis not present

## 2023-09-03 NOTE — Progress Notes (Signed)
 Cardiology Office Note:    Date:  09/03/2023  ID:  Jonathon [name], DOB Jan 04, 1951, MRN [id number] PCP: Jonelle Neri, DO  Jonathon [name] Cardiologist:  Oneil Bigness, MD       Patient Profile:      Chief Complaint: 1 year follow-up for heart failure and atrial fibrillation History of Present Illness:  Jonathon [name] is a 73 y.o. male with visit-pertinent history of proximal atrial fibrillation, combined systolic and diastolic heart failure, peripheral arterial disease, DVT, hypertension, hyperlipidemia, CKD stage III, T2DM, GERD  He was diagnosed with combined systolic and diastolic heart failure in February 2021 in the setting of sepsis with LVEF of 35-40%.  Repeat echocardiogram in February 2022 showed LVEF improved to 50 to 55%, global hypokinesis, mild LVH, mild biatrial enlargement, no significant abnormalities.  Patient was first evaluated by cardiology on 06/28/2019 for rate control and long-term management of atrial fibrillation during hospitalization for lower extremity embolectomy performed by vascular surgery.  Patient reported 10-year history of atrial fibrillation at the time.  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  ABIs 3/21 showed no evidence of significant right lower extremity arterial disease, absent waveform in the right great toe.  No evidence of significant left lower extremity arterial disease.  He was last seen in office on 08/01/2022 by Bernardo Bridgeman, NP.  Patient was doing well at that time without acute cardiovascular concerns or complaints.  His medication management was continued and he was to follow-up in 1 year.   Discussed the use of AI scribe software for clinical note transcription with the patient, who gave verbal consent to proceed.  History of Present Illness Jonathon [name] is a 73 year old male with atrial fibrillation  and heart failure who presents for a one-year follow-up.  He presents to clinic today with family.  He is without any acute cardiovascular concerns or complaints today.  He notes that he has been in good health over the past year.  Notes that he feels "well".  He remains adherent to his current medication regimen.  He denies chest pain, shortness of breath, lower extremity edema, fatigue, palpitations, melena, hematuria, hemoptysis, diaphoresis, weakness, presyncope, syncope, orthopnea, and PND.  Review of systems:  Please see the history of present illness. All other systems are reviewed and otherwise negative.     Home Medications:    Current Meds  Medication Sig   amLODipine  (NORVASC ) 5 MG tablet Take 1 tablet (5 mg total) by mouth daily.   ammonium lactate  (AMLACTIN DAILY) 12 % lotion Apply 1 Application topically as needed.   aspirin  EC 81 MG EC tablet Take 1 tablet (81 mg total) by mouth daily.   atorvastatin  (LIPITOR ) 80 MG tablet TAKE 1 TABLET BY MOUTH EVERYDAY AT BEDTIME   Cholecalciferol (VITAMIN D3) 1000 units CAPS Take 1 capsule by mouth daily.   fenofibrate  54 MG tablet TAKE 1 TABLET(54 MG) BY MOUTH DAILY   finasteride  (PROSCAR ) 5 MG tablet TAKE 1 TABLET (5 MG TOTAL) BY MOUTH DAILY.   levothyroxine  (SYNTHROID ) 112 MCG tablet Take 1 tablet (112 mcg total) by mouth at bedtime.   magnesium  oxide (MAG-OX) 400 MG tablet Take 1 tablet by mouth daily.   metoprolol  succinate (TOPROL -XL) 25 MG 24 hr tablet TAKE 1 TABLET (25 MG TOTAL) BY MOUTH DAILY.   pantoprazole  (PROTONIX ) 40 MG tablet TAKE 1 TABLET BY MOUTH EVERY DAY   pregabalin  (  LYRICA ) 75 MG capsule Take 2 capsules (150 mg total) by mouth at bedtime.   sertraline  (ZOLOFT ) 100 MG tablet Take 1 tablet (100 mg total) by mouth daily.   solifenacin  (VESICARE ) 10 MG tablet Take 1 tablet (10 mg total) by mouth daily.   tamsulosin  (FLOMAX ) 0.4 MG CAPS capsule TAKE 1 CAPSULE BY MOUTH EVERY DAY   traZODone  (DESYREL ) 100 MG tablet Take  0.5 tablets (50 mg total) by mouth at bedtime.   XARELTO  20 MG TABS tablet TAKE 1 TABLET BY MOUTH EVERY DAY     Studies Reviewed:   EKG Interpretation Date/Time:  Thursday Sep 03 2023 15:15:23 EDT Ventricular Rate:  78 PR Interval:    QRS Duration:  96 QT Interval:  390 QTC Calculation: 444 R Axis:   -44  Text Interpretation: Atrial fibrillation Left axis deviation Septal infarct (cited on or before 31-Aug-2023) When compared with ECG of 31-Aug-2023 16:04, No significant change was found Confirmed by Palmer Bobo [phone]) on 09/03/2023 3:31:57 PM    Echocardiogram 05/30/2020 1. Left ventricular ejection fraction, by estimation, is 50 to 55%. The  left ventricle has low normal function. The left ventricle demonstrates  global hypokinesis. There is mild left ventricular hypertrophy. Left  ventricular diastolic parameters are  indeterminate.   2. Right ventricular systolic function is normal. The right ventricular  size is normal. Tricuspid regurgitation signal is inadequate for assessing  PA pressure.   3. Left atrial size was mildly dilated.   4. Right atrial size was mildly dilated.   5. The mitral valve is normal in structure. No evidence of mitral valve  regurgitation. No evidence of mitral stenosis.   6. The aortic valve is tricuspid. Aortic valve regurgitation is not  visualized. No aortic stenosis is present.   7. Aortic dilatation noted. There is mild dilatation of the ascending  aorta, measuring 41 mm.   8. The inferior vena cava is normal in size with greater than 50%  respiratory variability, suggesting right atrial pressure of 3 mmHg.   9. The patient was in atrial fibrillation.  Risk Assessment/Calculations:    CHA2DS2-VASc Score = 5   This indicates a 7.2% annual risk of stroke. The patient's score is based upon: CHF History: 1 HTN History: 1 Diabetes History: 1 Stroke History: 0 Vascular Disease History: 1 Age Score: 1 Gender Score: 0             Physical Exam:   VS:  BP 110/68 (BP Location: Left Arm, Patient Position: Sitting, Cuff Size: Normal)   Pulse 78   Ht 6' (1.829 m)   Wt 213 lb (96.6 kg)   BMI 28.89 kg/m    Wt Readings from Last 3 Encounters:  09/03/23 213 lb (96.6 kg)  08/31/23 215 lb 6.4 oz (97.7 kg)  08/12/23 216 lb 1.6 oz (98 kg)    GEN: Well nourished, well developed in no acute distress NECK: No JVD; No carotid bruits CARDIAC: Irregular irregular, no murmurs, rubs, gallops RESPIRATORY:  Clear to auscultation without rales, wheezing or rhonchi  ABDOMEN: Soft, non-tender, non-distended EXTREMITIES:  No edema; No acute deformity     Assessment and Plan:  Chronic combined systolic and diastolic heart failure Echocardiogram February 2022 showed LVEF 50 to 55%, no LVH, mild BAE - Today patient is euvolemic and well compensated.  NYHA class II.  Not requiring loop diuretic therapy.  Denies SOB, orthopnea, PND, leg swelling - Continue metoprolol  XL 25 mg daily  Persistent atrial fibrillation EKG today shows atrial  fibrillation with HR 78 bpm His rate remains well-controlled No cardiac awareness of arrhythmia and denies any bleeding concerns - Continue Xarelto  20 mg daily and metoprolol  XL 25 mg daily  Hypertension Blood pressure today is 110/68 and under excellent control - Continue current antihypertensive regimen  Hyperlipidemia LDL 54, TG 115, TC 112 on 01/01/6212 LDL currently under excellent control and under goal of less than 70 - Continue atorvastatin  80 mg daily, no myalgias  Peripheral arterial disease S/p right popliteal superficial femoral and tibial artery embolectomy and subsequent right great toe amputation March 2021 - Today and over the past year he denies any claudication  Type 2 diabetes A1c 5.6 on 08/31/2023 - Currently under excellent control managed by primary care provider      Dispo:  Return in about 1 year (around 09/02/2024).  Signed, Ava Boatman, NP

## 2023-09-03 NOTE — Patient Instructions (Signed)
 Medication Instructions:  NO CHANGES   Lab Work: NONE   Testing/Procedures: NONE  Follow-Up: At [facility], you and your health needs are our priority.  As part of our continuing mission to provide you with exceptional heart care, our providers are all part of one team.  This team includes your primary Cardiologist (physician) and Advanced Practice Providers or APPs (Physician Assistants and Nurse Practitioners) who all work together to provide you with the care you need, when you need it.  Your next appointment:   1 YEAR  Provider:   Oneil Bigness, MD

## 2023-09-07 LAB — JAK2 V617F RFX CALR/MPL/E12-15

## 2023-09-07 LAB — CALR +MPL + E12-E15  (REFLEX)

## 2023-09-08 ENCOUNTER — Encounter: Payer: Self-pay | Admitting: Student

## 2023-09-08 ENCOUNTER — Ambulatory Visit: Payer: Self-pay | Admitting: Student

## 2023-09-08 DIAGNOSIS — D751 Secondary polycythemia: Secondary | ICD-10-CM

## 2023-09-09 DIAGNOSIS — R2681 Unsteadiness on feet: Secondary | ICD-10-CM | POA: Diagnosis not present

## 2023-09-09 DIAGNOSIS — R2689 Other abnormalities of gait and mobility: Secondary | ICD-10-CM | POA: Diagnosis not present

## 2023-09-09 DIAGNOSIS — M6281 Muscle weakness (generalized): Secondary | ICD-10-CM | POA: Diagnosis not present

## 2023-09-09 DIAGNOSIS — H81399 Other peripheral vertigo, unspecified ear: Secondary | ICD-10-CM | POA: Diagnosis not present

## 2023-09-14 DIAGNOSIS — M6281 Muscle weakness (generalized): Secondary | ICD-10-CM | POA: Diagnosis not present

## 2023-09-14 DIAGNOSIS — H81399 Other peripheral vertigo, unspecified ear: Secondary | ICD-10-CM | POA: Diagnosis not present

## 2023-09-14 DIAGNOSIS — R2681 Unsteadiness on feet: Secondary | ICD-10-CM | POA: Diagnosis not present

## 2023-09-14 DIAGNOSIS — R2689 Other abnormalities of gait and mobility: Secondary | ICD-10-CM | POA: Diagnosis not present

## 2023-09-16 DIAGNOSIS — H81399 Other peripheral vertigo, unspecified ear: Secondary | ICD-10-CM | POA: Diagnosis not present

## 2023-09-16 DIAGNOSIS — M6281 Muscle weakness (generalized): Secondary | ICD-10-CM | POA: Diagnosis not present

## 2023-09-16 DIAGNOSIS — R2689 Other abnormalities of gait and mobility: Secondary | ICD-10-CM | POA: Diagnosis not present

## 2023-09-16 DIAGNOSIS — R2681 Unsteadiness on feet: Secondary | ICD-10-CM | POA: Diagnosis not present

## 2023-09-23 DIAGNOSIS — R2681 Unsteadiness on feet: Secondary | ICD-10-CM | POA: Diagnosis not present

## 2023-09-23 DIAGNOSIS — H81399 Other peripheral vertigo, unspecified ear: Secondary | ICD-10-CM | POA: Diagnosis not present

## 2023-09-23 DIAGNOSIS — R2689 Other abnormalities of gait and mobility: Secondary | ICD-10-CM | POA: Diagnosis not present

## 2023-09-23 DIAGNOSIS — M6281 Muscle weakness (generalized): Secondary | ICD-10-CM | POA: Diagnosis not present

## 2023-09-28 DIAGNOSIS — R2681 Unsteadiness on feet: Secondary | ICD-10-CM | POA: Diagnosis not present

## 2023-09-28 DIAGNOSIS — R2689 Other abnormalities of gait and mobility: Secondary | ICD-10-CM | POA: Diagnosis not present

## 2023-09-28 DIAGNOSIS — H81399 Other peripheral vertigo, unspecified ear: Secondary | ICD-10-CM | POA: Diagnosis not present

## 2023-09-28 DIAGNOSIS — M6281 Muscle weakness (generalized): Secondary | ICD-10-CM | POA: Diagnosis not present

## 2023-09-30 DIAGNOSIS — H81399 Other peripheral vertigo, unspecified ear: Secondary | ICD-10-CM | POA: Diagnosis not present

## 2023-09-30 DIAGNOSIS — M6281 Muscle weakness (generalized): Secondary | ICD-10-CM | POA: Diagnosis not present

## 2023-09-30 DIAGNOSIS — R2689 Other abnormalities of gait and mobility: Secondary | ICD-10-CM | POA: Diagnosis not present

## 2023-09-30 DIAGNOSIS — R2681 Unsteadiness on feet: Secondary | ICD-10-CM | POA: Diagnosis not present

## 2023-10-01 ENCOUNTER — Ambulatory Visit (HOSPITAL_BASED_OUTPATIENT_CLINIC_OR_DEPARTMENT_OTHER)

## 2023-10-01 ENCOUNTER — Ambulatory Visit (INDEPENDENT_AMBULATORY_CARE_PROVIDER_SITE_OTHER): Admitting: Student

## 2023-10-01 ENCOUNTER — Encounter: Payer: Self-pay | Admitting: Student

## 2023-10-01 DIAGNOSIS — I4891 Unspecified atrial fibrillation: Secondary | ICD-10-CM | POA: Diagnosis not present

## 2023-10-01 DIAGNOSIS — Z0000 Encounter for general adult medical examination without abnormal findings: Secondary | ICD-10-CM

## 2023-10-01 DIAGNOSIS — R531 Weakness: Secondary | ICD-10-CM | POA: Diagnosis not present

## 2023-10-01 DIAGNOSIS — I480 Paroxysmal atrial fibrillation: Secondary | ICD-10-CM

## 2023-10-01 DIAGNOSIS — I5022 Chronic systolic (congestive) heart failure: Secondary | ICD-10-CM

## 2023-10-01 DIAGNOSIS — D751 Secondary polycythemia: Secondary | ICD-10-CM | POA: Diagnosis not present

## 2023-10-01 DIAGNOSIS — I4811 Longstanding persistent atrial fibrillation: Secondary | ICD-10-CM

## 2023-10-01 LAB — ECHOCARDIOGRAM COMPLETE
Area-P 1/2: 4.49 cm2
Height: 72 in
S' Lateral: 3.06 cm
Weight: 3436.8 [oz_av]

## 2023-10-01 MED ORDER — TRAZODONE HCL 50 MG PO TABS: 50.0000 mg | ORAL_TABLET | Freq: Every day | ORAL | 3 refills | Status: DC

## 2023-10-01 MED ORDER — TRAZODONE HCL 50 MG PO TABS: 50.0000 mg | ORAL_TABLET | Freq: Every day | ORAL | 1 refills | Status: DC

## 2023-10-01 NOTE — Assessment & Plan Note (Signed)
 Chronic. Denies abdominal pain, n/v/d. Still low, taking mag oxide 400 daily. Repeat mag level today.

## 2023-10-01 NOTE — Progress Notes (Signed)
 CC: 1 month f/u  HPI:  [name] is a 73 y.o. male living with a history stated below and presents today for 1 month f/u. Please see problem based assessment and plan for additional details.  Past Medical History:  Diagnosis Date   Anemia due to blood loss, acute 2014   2nd GIB   Anxiety    CKD (chronic kidney disease), stage III (HCC) 2014   Diabetes mellitus without complication (HCC)    Gout    High cholesterol    Hypertension 2014   Hypothyroid    Neuropathy    PUD (peptic ulcer disease) 2014    Current Outpatient Medications on File Prior to Visit  Medication Sig Dispense Refill   amLODipine  (NORVASC ) 5 MG tablet Take 1 tablet (5 mg total) by mouth daily. 30 tablet 11   ammonium lactate  (AMLACTIN DAILY) 12 % lotion Apply 1 Application topically as needed. 400 g 0   aspirin  EC 81 MG EC tablet Take 1 tablet (81 mg total) by mouth daily.     atorvastatin  (LIPITOR ) 80 MG tablet TAKE 1 TABLET BY MOUTH EVERYDAY AT BEDTIME 90 tablet 2   Cholecalciferol (VITAMIN D3) 1000 units CAPS Take 1 capsule by mouth daily. 30 capsule 2   fenofibrate  54 MG tablet TAKE 1 TABLET(54 MG) BY MOUTH DAILY 90 tablet 1   finasteride  (PROSCAR ) 5 MG tablet TAKE 1 TABLET (5 MG TOTAL) BY MOUTH DAILY. 90 tablet 3   levothyroxine  (SYNTHROID ) 112 MCG tablet Take 1 tablet (112 mcg total) by mouth at bedtime. 90 tablet 3   Magnesium  250 MG TABS Take 1 tablet (250 mg total) by mouth daily. (Patient not taking: Reported on 09/03/2023) 30 tablet 0   magnesium  oxide (MAG-OX) 400 MG tablet Take 1 tablet by mouth daily.     metoprolol  succinate (TOPROL -XL) 25 MG 24 hr tablet TAKE 1 TABLET (25 MG TOTAL) BY MOUTH DAILY. 90 tablet 3   pantoprazole  (PROTONIX ) 40 MG tablet TAKE 1 TABLET BY MOUTH EVERY DAY 90 tablet 1   pregabalin  (LYRICA ) 75 MG capsule Take 2 capsules (150 mg total) by mouth at bedtime. 180 capsule 1   sertraline  (ZOLOFT ) 100 MG tablet Take 1 tablet (100 mg total) by mouth daily. 90 tablet 4    solifenacin  (VESICARE ) 10 MG tablet Take 1 tablet (10 mg total) by mouth daily. 30 tablet 11   tamsulosin  (FLOMAX ) 0.4 MG CAPS capsule TAKE 1 CAPSULE BY MOUTH EVERY DAY 90 capsule 1   XARELTO  20 MG TABS tablet TAKE 1 TABLET BY MOUTH EVERY DAY 60 tablet 5   No current facility-administered medications on file prior to visit.    Family History  Problem Relation Age of Onset   Diabetes Mellitus II Maternal Aunt    Lung cancer Mother    Pancreatic cancer Sister     Social History   Socioeconomic History   Marital status: Married    Spouse name: Not on file   Number of children: Not on file   Years of education: Not on file   Highest education level: Not on file  Occupational History   Occupation: banking    Comment: retired  Tobacco Use   Smoking status: Former    Current packs/day: 2.00    Average packs/day: 2.0 packs/day for 3.0 years (6.0 ttl pk-yrs)    Types: Cigarettes   Smokeless tobacco: Former    Quit date: 05/08/1967  Vaping Use   Vaping status: Never Used  Substance and Sexual Activity  Alcohol use: Yes    Alcohol/week: 14.0 standard drinks of alcohol    Types: 14 Shots of liquor per week    Comment: daily   Drug use: Yes    Types: Marijuana   Sexual activity: Not on file  Other Topics Concern   Not on file  Social History Narrative   Not on file   Social Drivers of Health   Financial Resource Strain: Low Risk  (05/30/2022)   Overall Financial Resource Strain (CARDIA)    Difficulty of Paying Living Expenses: Not hard at all  Food Insecurity: No Food Insecurity (05/30/2022)   Hunger Vital Sign    Worried About Running Out of Food in the Last Year: Never true    Ran Out of Food in the Last Year: Never true  Transportation Needs: No Transportation Needs (05/30/2022)   PRAPARE - Administrator, Civil Service (Medical): No    Lack of Transportation (Non-Medical): No  Physical Activity: Inactive (05/30/2022)   Exercise Vital Sign    Days of Exercise  per Week: 0 days    Minutes of Exercise per Session: 0 min  Stress: No Stress Concern Present (05/30/2022)   [employer] of Occupational Health - Occupational Stress Questionnaire    Feeling of Stress : Not at all  Social Connections: Moderately Isolated (05/30/2022)   Social Connection and Isolation Panel [NHANES]    Frequency of Communication with Friends and Family: Twice a week    Frequency of Social Gatherings with Friends and Family: Once a week    Attends Religious Services: Never    Database administrator or Organizations: No    Attends Banker Meetings: Never    Marital Status: Married  Catering manager Violence: Not At Risk (05/30/2022)   Humiliation, Afraid, Rape, and Kick questionnaire    Fear of Current or Ex-Partner: No    Emotionally Abused: No    Physically Abused: No    Sexually Abused: No    Review of Systems: ROS negative except for what is noted on the assessment and plan.  Vitals:   10/01/23 1330  BP: 121/81  Pulse: 77  Temp: 98.7 F (37.1 C)  TempSrc: Oral  SpO2: 95%  Weight: 214 lb 12.8 oz (97.4 kg)  Height: 6' (1.829 m)   Physical Exam: Constitutional: alert, sitting in chair comfortably, in no acute distress Cardiovascular: irregular rhythm, regular rate Pulmonary/Chest: normal work of breathing on room air, lungs clear to auscultation bilaterally MSK: no LE edema Neurological: awake and alert  Psych: pleasant mood  Assessment & Plan:   Congestive heart failure (HCC) HFrecEF with EF 50-55% in 2022 with global hypokinesis. Does not appear volume overloaded today. Seen by cardiology last month. Pending repeat echo scheduled later today. Taking metoprolol  XL 25 mg daily.  Plan -F/u echo results and continue cardiology f/u  -Continue metoprolol  (has Afib rate controlled)  Atrial fibrillation (HCC) Remains in Afib on AC but rate controlled on metoprolol  XL 25 mg daily. Followed by cardiology as well.   Plan -Continue Xarelto  and  metoprolol    Generalized weakness F/u regarding this. See last 2 OV regarding workup. States more awake per patient and patient's wife compared to previous visits. Tolerating reduced trazodone  50 mg at bedtime. Still has hypomagnesemia but taking supplement. Working with PT twice a week. Sounds like gradually better so will continue to monitor.   Hypomagnesemia Chronic. Denies abdominal pain, n/v/d. Still low, taking mag oxide 400 daily. Repeat mag level today.  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 Given erythrocytosis and thrombocytopenia, referral to heme/onc placed at last OV on 5/5. Patient has appt on 6/18.  Health care maintenance -Unable to void, defer urine ACR to future visit  -Completed annual eye exam pending fax records over -Discussed Tdap and Shingles vaccine, plan to get at local pharmacy   Patient discussed with Dr. Juanna Norman, D.O. [facility], PGY-2 Phone: [phone] Date 10/01/2023 Time 5:17 PM

## 2023-10-01 NOTE — Assessment & Plan Note (Signed)
 Remains in Afib on [facility] but rate controlled on metoprolol  XL 25 mg daily. Followed by cardiology as well.   Plan -Continue Xarelto  and metoprolol 

## 2023-10-01 NOTE — Assessment & Plan Note (Signed)
-  Unable to void, defer urine ACR to future visit  -Completed annual eye exam pending fax records over -Discussed Tdap and Shingles vaccine, plan to get at local pharmacy

## 2023-10-01 NOTE — Patient Instructions (Signed)
 Thank you, Jonathon [name] for allowing us  to provide your care today. Today we discussed   -Blood work today to check magnesium  -Complete echocardiogram this afternoon -Continue with trazodone  50 mg at bedtime    Follow up: 3 months   Should you have any questions or concerns please call the internal medicine clinic at [phone].    Deshane Cotroneo, D.O. [facility]

## 2023-10-01 NOTE — Assessment & Plan Note (Signed)
 HFrecEF with EF 50-55% in 2022 with global hypokinesis. Does not appear volume overloaded today. Seen by cardiology last month. Pending repeat echo scheduled later today. Taking metoprolol  XL 25 mg daily.  Plan -F/u echo results and continue cardiology f/u  -Continue metoprolol  (has Afib rate controlled)

## 2023-10-01 NOTE — Assessment & Plan Note (Signed)
 F/u regarding this. See last 2 OV regarding workup. States more awake per patient and patient's wife compared to previous visits. Tolerating reduced trazodone  50 mg at bedtime. Still has hypomagnesemia but taking supplement. Working with PT twice a week. Sounds like gradually better so will continue to monitor.

## 2023-10-01 NOTE — Assessment & Plan Note (Signed)
 JAK2 negative. Given erythrocytosis and thrombocytopenia, referral to heme/onc placed at last OV on 5/5. Patient has appt on 6/18.

## 2023-10-02 ENCOUNTER — Ambulatory Visit: Payer: Self-pay | Admitting: Student

## 2023-10-02 ENCOUNTER — Ambulatory Visit (INDEPENDENT_AMBULATORY_CARE_PROVIDER_SITE_OTHER): Admitting: Podiatry

## 2023-10-02 DIAGNOSIS — E1142 Type 2 diabetes mellitus with diabetic polyneuropathy: Secondary | ICD-10-CM

## 2023-10-02 DIAGNOSIS — L97512 Non-pressure chronic ulcer of other part of right foot with fat layer exposed: Secondary | ICD-10-CM

## 2023-10-02 LAB — MAGNESIUM: Magnesium: 1.5 mg/dL — ABNORMAL LOW (ref 1.6–2.3)

## 2023-10-02 MED ORDER — SANTYL 250 UNIT/GM EX OINT: 1.0000 | TOPICAL_OINTMENT | Freq: Every day | CUTANEOUS | 0 refills | Status: DC

## 2023-10-02 NOTE — Progress Notes (Signed)
 Subjective:  Patient ID: Jonathon [name], male    DOB: October 23, 1950,  MRN: [id number]  Chief Complaint  Patient presents with   Wound Check    73 y.o. male presents for wound care.  Patient presents with right second digit ulceration has been present for quite some time.  He is a diabetic with last A1c of 5.6.  He was treated by Dr. Zettie Hillock for local wound care for quite some time greater than 4 weeks.  He wanted to discuss other treatment options.  He would like to discuss synthetic skin grafting.  He does not have any circulation problems.  He does not smoke.   Review of Systems: Negative except as noted in the HPI. Denies N/V/F/Ch.  Past Medical History:  Diagnosis Date   Anemia due to blood loss, acute 2014   2nd GIB   Anxiety    CKD (chronic kidney disease), stage III (HCC) 2014   Diabetes mellitus without complication (HCC)    Gout    High cholesterol    Hypertension 2014   Hypothyroid    Neuropathy    PUD (peptic ulcer disease) 2014    Current Outpatient Medications:    collagenase (SANTYL) 250 UNIT/GM ointment, Apply 1 Application topically daily., Disp: 15 g, Rfl: 0   amLODipine  (NORVASC ) 5 MG tablet, Take 1 tablet (5 mg total) by mouth daily., Disp: 30 tablet, Rfl: 11   ammonium lactate  (AMLACTIN DAILY) 12 % lotion, Apply 1 Application topically as needed., Disp: 400 g, Rfl: 0   aspirin  EC 81 MG EC tablet, Take 1 tablet (81 mg total) by mouth daily., Disp:  , Rfl:    atorvastatin  (LIPITOR ) 80 MG tablet, TAKE 1 TABLET BY MOUTH EVERYDAY AT BEDTIME, Disp: 90 tablet, Rfl: 2   Cholecalciferol (VITAMIN D3) 1000 units CAPS, Take 1 capsule by mouth daily., Disp: 30 capsule, Rfl: 2   fenofibrate  54 MG tablet, TAKE 1 TABLET(54 MG) BY MOUTH DAILY, Disp: 90 tablet, Rfl: 1   finasteride  (PROSCAR ) 5 MG tablet, TAKE 1 TABLET (5 MG TOTAL) BY MOUTH DAILY., Disp: 90 tablet, Rfl: 3   levothyroxine  (SYNTHROID ) 112 MCG tablet, Take 1 tablet (112 mcg total) by mouth at bedtime., Disp: 90  tablet, Rfl: 3   Magnesium  250 MG TABS, Take 1 tablet (250 mg total) by mouth daily. (Patient not taking: Reported on 09/03/2023), Disp: 30 tablet, Rfl: 0   magnesium  oxide (MAG-OX) 400 MG tablet, Take 1 tablet by mouth daily., Disp: , Rfl:    metoprolol  succinate (TOPROL -XL) 25 MG 24 hr tablet, TAKE 1 TABLET (25 MG TOTAL) BY MOUTH DAILY., Disp: 90 tablet, Rfl: 3   pantoprazole  (PROTONIX ) 40 MG tablet, TAKE 1 TABLET BY MOUTH EVERY DAY, Disp: 90 tablet, Rfl: 1   pregabalin  (LYRICA ) 75 MG capsule, Take 2 capsules (150 mg total) by mouth at bedtime., Disp: 180 capsule, Rfl: 1   sertraline  (ZOLOFT ) 100 MG tablet, Take 1 tablet (100 mg total) by mouth daily., Disp: 90 tablet, Rfl: 4   solifenacin  (VESICARE ) 10 MG tablet, Take 1 tablet (10 mg total) by mouth daily., Disp: 30 tablet, Rfl: 11   tamsulosin  (FLOMAX ) 0.4 MG CAPS capsule, TAKE 1 CAPSULE BY MOUTH EVERY DAY, Disp: 90 capsule, Rfl: 1   traZODone  (DESYREL ) 50 MG tablet, Take 1 tablet (50 mg total) by mouth at bedtime., Disp: 90 tablet, Rfl: 1   XARELTO  20 MG TABS tablet, TAKE 1 TABLET BY MOUTH EVERY DAY, Disp: 60 tablet, Rfl: 5  Social History   Tobacco  Use  Smoking Status Former   Current packs/day: 2.00   Average packs/day: 2.0 packs/day for 3.0 years (6.0 ttl pk-yrs)   Types: Cigarettes  Smokeless Tobacco Former   Quit date: 05/08/1967    Allergies  Allergen Reactions   Morphine  And Codeine Itching   Objective:  There were no vitals filed for this visit. There is no height or weight on file to calculate BMI. Constitutional Well developed. Well nourished.  Vascular Dorsalis pedis pulses palpable bilaterally. Posterior tibial pulses palpable bilaterally. Capillary refill normal to all digits.  No cyanosis or clubbing noted. Pedal hair growth normal.  Neurologic Normal speech. Oriented to person, place, and time. Protective sensation absent  Dermatologic Wound Location: Right second digit ulceration fat layer exposed no does not  probe down to bone.  No exposure of deep tissue noted.  No infection noted.  Palpable pulses Wound Base: Mixed Granular/Fibrotic Fibrotic slough Peri-wound: Calloused Exudate: Scant/small amount Serosanguinous exudate Wound Measurements: - See above  Orthopedic: No pain to palpation either foot.   Radiographs: None Assessment:   1. Skin ulcer of second toe of right foot with fat layer exposed (HCC)   2. Diabetic peripheral neuropathy associated with type 2 diabetes mellitus (HCC)    Plan:  Patient was evaluated and treated and all questions answered.  Ulcer right second digit ulceration fat layer exposed -Debridement as below. -Dressed with Santyl, DSD. -Continue off-loading with surgical shoe. - Patient is not a current smoker.  Patient has palpable pulses therefore ABI PVRs are not indicated - Patient would benefit from amniotic skin graft.  Skin graft application approval was submitted.    Procedure: Excisional Debridement of Wound Tool: Sharp chisel blade/tissue nipper Rationale: Removal of non-viable soft tissue from the wound to promote healing.  Anesthesia: none Pre-Debridement Wound Measurements: 1 cm x 1 cm x 0.3 cm  Post-Debridement Wound Measurements: 1.1 cm x 1 cm x 0.3 cm  Type of Debridement: Sharp Excisional Tissue Removed: Non-viable soft tissue Blood loss: Minimal (<50cc) Depth of Debridement: subcutaneous tissue. Technique: Sharp excisional debridement to bleeding, viable wound base.  Wound Progress: The wound is reached to point of stagnation.  Patient will benefit from graft application Site healing conversation 7 Dressing: Dry, sterile, compression dressing. Disposition: Patient tolerated procedure well. Patient to return in 1 week for follow-up.  No follow-ups on file.

## 2023-10-05 NOTE — Progress Notes (Signed)
 Internal Medicine Clinic Attending  Case discussed with the resident at the time of the visit.  We reviewed the resident's history and exam and pertinent patient test results.  I agree with the assessment, diagnosis, and plan of care documented in the resident's note.

## 2023-10-07 DIAGNOSIS — M6281 Muscle weakness (generalized): Secondary | ICD-10-CM | POA: Diagnosis not present

## 2023-10-07 DIAGNOSIS — H81399 Other peripheral vertigo, unspecified ear: Secondary | ICD-10-CM | POA: Diagnosis not present

## 2023-10-07 DIAGNOSIS — R2689 Other abnormalities of gait and mobility: Secondary | ICD-10-CM | POA: Diagnosis not present

## 2023-10-07 DIAGNOSIS — R2681 Unsteadiness on feet: Secondary | ICD-10-CM | POA: Diagnosis not present

## 2023-10-09 ENCOUNTER — Ambulatory Visit (INDEPENDENT_AMBULATORY_CARE_PROVIDER_SITE_OTHER): Admitting: Podiatry

## 2023-10-09 DIAGNOSIS — L97512 Non-pressure chronic ulcer of other part of right foot with fat layer exposed: Secondary | ICD-10-CM | POA: Diagnosis not present

## 2023-10-09 NOTE — Progress Notes (Signed)
 Application of Amnio-Maxx Graft: -arterial studies and/or Vascular intervention: palpable pedal pulses. ABI/Pvr deferred.  -Patient has severe morbidity: Diabetes  -Patient was also nutritionally optimized and encouraged protein rich diet -Smoking cessation was discussed in extensive detail. Patient is not current smoker   -Wound start date:08/09/23 -Interventions attempted: Betadine wet to dry/local wound care, offloading, immobilization  -Decision was made to use Amniotic graft since partial / full thickness skin grafts such as Theraskin can lead to scarring and wound recurrence. The clinic did not use any synthetic grafts due to higher chance of foreign body reactions /rejection. Cryo-preserved amniotic graft was not used since storage infrastructure is not available at this outpatient clinic and the patient is unwilling to go to a wound care center. Grafts requiring upfront purchase were not considered due to financial limitations of this outpatient clinic and thus only grafts available on consignment were considered. Amionitic was chosen based on various characteristics including but not limited to scaffolding properties, heavy chain hyaluronic acid, cytokines, and grown factors to expedite wound healing.  Graft application: # 1 -The wound was cleansed with saline. Selective debridement techniques to remove devitalized tissue was performed through subcutaneous tissue with 15 blade until viable bleeding tissue was encountered. -Topical application of Amnio-Max graft was placed over the wound and dressed with adaptic non-adherent, gauze, kling and ACE bandage. -Product Code: AMN-22 -Expiration: 10/27/2027 -Lot# [id number]  Procedure: Excisional Debridement of Wound Tool: Sharp chisel blade/tissue nipper Rationale: Removal of non-viable soft tissue from the wound to promote healing.  Anesthesia: none Pre-Debridement Wound Measurements: 1 cm x 1 cm x 0.3 cm  Post-Debridement Wound  Measurements: 1 cm x 1 cm x 0.3 cm  Type of Debridement: Sharp Excisional Tissue Removed: Non-viable soft tissue Blood loss: Minimal (<50cc) Depth of Debridement: subcutaneous tissue. Technique: Sharp excisional debridement to bleeding, viable wound base.  Wound Progress: This is my initial application  Dressing: Dry, sterile, compression dressing. Disposition: Patient tolerated procedure well. Patient to return in 1 week for follow-up.    -The entire graft was utilized and non discarded. Graft was used in wound described above. I folded the graft in order to maximize amount of product used for the wound and maximize benefit from scaffold properties of the amniotic graft.

## 2023-10-12 DIAGNOSIS — H81399 Other peripheral vertigo, unspecified ear: Secondary | ICD-10-CM | POA: Diagnosis not present

## 2023-10-12 DIAGNOSIS — M6281 Muscle weakness (generalized): Secondary | ICD-10-CM | POA: Diagnosis not present

## 2023-10-12 DIAGNOSIS — R2681 Unsteadiness on feet: Secondary | ICD-10-CM | POA: Diagnosis not present

## 2023-10-12 DIAGNOSIS — R2689 Other abnormalities of gait and mobility: Secondary | ICD-10-CM | POA: Diagnosis not present

## 2023-10-13 NOTE — Progress Notes (Incomplete)
 HEMATOLOGY/ONCOLOGY CLINIC NOTE  Date of Service: 10/14/2023  Patient Care Team: Jonelle Neri, DO as PCP - General O'Neal, Cathay Clonts, MD as PCP - Cardiology (Internal Medicine)  REFERRING PHYSICIAN: Jonelle Neri, DO  CHIEF COMPLAINTS/PURPOSE OF CONSULTATION:  Polycythemia and Hx of DVT   HISTORY OF PRESENTING ILLNESS:   Jonathon [name] is a wonderful 73 y.o. male who has been referred to us  by Wyn Heater MD  for evaluation and management of DVT. Pt is accompanied today by Mrs. Olshefski. The pt reports that he is doing well overall.   The pt reports he is good. Pt has had both doses of the COVID19 vaccine. He was having pain in the right leg. 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 At the beginning of February he saw that the right big toe was bright red and could see the redness was moving up the leg. Pt had an US  of the leg and they found a clot. He has been placed on Xarelto . Pt has never had clots related to surgeries. He is still on a testosterone  shots for about 6 months.   He is up to date with his age appropriate cancer screenings. About three years ago the pt developed a shortening or tightening of achilles tendon and started going to PT, but they have gotten better. Pt also had an adrenalectomy years ago. Previously he had been physically active. Pt had gout one time a while ago but is still taking gout medication.   Most recent lab results (07/18/29) of CMP is as follows: all values are WNL except for CO2 at 21, Glucose at 125, Creatinine at 1.29, Calcium  at 7.6, GFR calc non Af Amer at 56 07/18/19 of CBC is as follows: all values are WNL except for Platelets at 115K,  07/19/19 of Glucose-Capillary at 148  On review of systems, pt denies irregular bowl movement, bleeding concerns, unexpected weight loss and any other symptoms.   On PMHx the pt reports atrial fibrillation, Right great toe amputation, adrenalectomy    On Social Hx the pt reports smoking (quit in  1979)   On Family Hx the pt reports daughter has a hyper coagulative disorder (PAI-1 4g-5g Polymorphism), father had stroke   INTERVAL HISTORY:   Jonathon [name] is a 73 y.o. male who was previously following up with us  for DVT. His last visit with us  was in April 2021. He returns today for evaluation and management of polycythemia.     MEDICAL HISTORY:  Past Medical History:  Diagnosis Date   Anemia due to blood loss, acute 2014   2nd GIB   Anxiety    CKD (chronic kidney disease), stage III (HCC) 2014   Diabetes mellitus without complication (HCC)    Gout    High cholesterol    Hypertension 2014   Hypothyroid    Neuropathy    PUD (peptic ulcer disease) 2014     SURGICAL HISTORY: Past Surgical History:  Procedure Laterality Date   ABDOMINAL AORTOGRAM W/LOWER EXTREMITY Bilateral 06/27/2019   Procedure: ABDOMINAL AORTOGRAM W/LOWER EXTREMITY;  Surgeon: Richrd Char, MD;  Location: [facility];  Service: Vascular;  Laterality: Bilateral;   ADRENALECTOMY     AMPUTATION Right 07/18/2019   Procedure: AMPUTATION RIGHT GREAT TOE;  Surgeon: Young Hensen, MD;  Location: [facility] OR;  Service: Vascular;  Laterality: Right;   BIOPSY  09/09/2021   Procedure: BIOPSY;  Surgeon: Annis Kinder, DO;  Location: [facility];  Service: Gastroenterology;;  COLONOSCOPY WITH PROPOFOL  N/A 09/09/2021   Procedure: COLONOSCOPY WITH PROPOFOL ;  Surgeon: Annis Kinder, DO;  Location: [facility];  Service: Gastroenterology;  Laterality: N/A;   COLONOSCOPY WITH PROPOFOL  N/A 09/20/2021   Procedure: COLONOSCOPY WITH PROPOFOL ;  Surgeon: Sergio Dandy, MD;  Location: [facility];  Service: Gastroenterology;  Laterality: N/A;   EMBOLECTOMY Right 06/27/2019   Procedure: RIGHT LEG EMBOLECTOMY;  Surgeon: Richrd Char, MD;  Location: [facility] OR;  Service: Vascular;  Laterality: Right;   ESOPHAGOGASTRODUODENOSCOPY (EGD) WITH PROPOFOL  N/A 09/09/2021   Procedure: ESOPHAGOGASTRODUODENOSCOPY  (EGD) WITH PROPOFOL ;  Surgeon: Annis Kinder, DO;  Location: [facility];  Service: Gastroenterology;  Laterality: N/A;   HEMOSTASIS CLIP PLACEMENT  09/20/2021   Procedure: HEMOSTASIS CLIP PLACEMENT;  Surgeon: Sergio Dandy, MD;  Location: [facility];  Service: Gastroenterology;;   POLYPECTOMY  09/09/2021   Procedure: POLYPECTOMY;  Surgeon: Annis Kinder, DO;  Location: [facility];  Service: Gastroenterology;;  EGD and COLON     SOCIAL HISTORY: Social History   Socioeconomic History   Marital status: Married    Spouse name: Not on file   Number of children: Not on file   Years of education: Not on file   Highest education level: Not on file  Occupational History   Occupation: banking    Comment: retired  Tobacco Use   Smoking status: Former    Current packs/day: 2.00    Average packs/day: 2.0 packs/day for 3.0 years (6.0 ttl pk-yrs)    Types: Cigarettes   Smokeless tobacco: Former    Quit date: 05/08/1967  Vaping Use   Vaping status: Never Used  Substance and Sexual Activity   Alcohol use: Yes    Alcohol/week: 14.0 standard drinks of alcohol    Types: 14 Shots of liquor per week    Comment: daily   Drug use: Yes    Types: Marijuana   Sexual activity: Not on file  Other Topics Concern   Not on file  Social History Narrative   Not on file   Social Drivers of Health   Financial Resource Strain: Low Risk  (05/30/2022)   Overall Financial Resource Strain (CARDIA)    Difficulty of Paying Living Expenses: Not hard at all  Food Insecurity: No Food Insecurity (05/30/2022)   Hunger Vital Sign    Worried About Running Out of Food in the Last Year: Never true    Ran Out of Food in the Last Year: Never true  Transportation Needs: No Transportation Needs (05/30/2022)   PRAPARE - Administrator, Civil Service (Medical): No    Lack of Transportation (Non-Medical): No  Physical Activity: Inactive (05/30/2022)   Exercise Vital Sign    Days of Exercise per  Week: 0 days    Minutes of Exercise per Session: 0 min  Stress: No Stress Concern Present (05/30/2022)   [employer] of Occupational Health - Occupational Stress Questionnaire    Feeling of Stress : Not at all  Social Connections: Moderately Isolated (05/30/2022)   Social Connection and Isolation Panel    Frequency of Communication with Friends and Family: Twice a week    Frequency of Social Gatherings with Friends and Family: Once a week    Attends Religious Services: Never    Database administrator or Organizations: No    Attends Banker Meetings: Never    Marital Status: Married  Catering manager Violence: Not At Risk (05/30/2022)   Humiliation, Afraid, Rape, and Kick questionnaire  Fear of Current or Ex-Partner: No    Emotionally Abused: No    Physically Abused: No    Sexually Abused: No     FAMILY HISTORY: Family History  Problem Relation Age of Onset   Diabetes Mellitus II Maternal Aunt    Lung cancer Mother    Pancreatic cancer Sister      ALLERGIES:   is allergic to morphine  and codeine.   MEDICATIONS:  Current Outpatient Medications  Medication Sig Dispense Refill   amLODipine  (NORVASC ) 5 MG tablet Take 1 tablet (5 mg total) by mouth daily. 30 tablet 11   ammonium lactate  (AMLACTIN DAILY) 12 % lotion Apply 1 Application topically as needed. 400 g 0   aspirin  EC 81 MG EC tablet Take 1 tablet (81 mg total) by mouth daily.     atorvastatin  (LIPITOR ) 80 MG tablet TAKE 1 TABLET BY MOUTH EVERYDAY AT BEDTIME 90 tablet 2   Cholecalciferol (VITAMIN D3) 1000 units CAPS Take 1 capsule by mouth daily. 30 capsule 2   collagenase  (SANTYL ) 250 UNIT/GM ointment Apply 1 Application topically daily. 15 g 0   fenofibrate  54 MG tablet TAKE 1 TABLET(54 MG) BY MOUTH DAILY 90 tablet 1   finasteride  (PROSCAR ) 5 MG tablet TAKE 1 TABLET (5 MG TOTAL) BY MOUTH DAILY. 90 tablet 3   levothyroxine  (SYNTHROID ) 112 MCG tablet Take 1 tablet (112 mcg total) by mouth at bedtime. 90  tablet 3   Magnesium  250 MG TABS Take 1 tablet (250 mg total) by mouth daily. (Patient not taking: Reported on 09/03/2023) 30 tablet 0   magnesium  oxide (MAG-OX) 400 MG tablet Take 1 tablet by mouth daily.     metoprolol  succinate (TOPROL -XL) 25 MG 24 hr tablet TAKE 1 TABLET (25 MG TOTAL) BY MOUTH DAILY. 90 tablet 3   pantoprazole  (PROTONIX ) 40 MG tablet TAKE 1 TABLET BY MOUTH EVERY DAY 90 tablet 1   pregabalin  (LYRICA ) 75 MG capsule Take 2 capsules (150 mg total) by mouth at bedtime. 180 capsule 1   sertraline  (ZOLOFT ) 100 MG tablet Take 1 tablet (100 mg total) by mouth daily. 90 tablet 4   solifenacin  (VESICARE ) 10 MG tablet Take 1 tablet (10 mg total) by mouth daily. 30 tablet 11   tamsulosin  (FLOMAX ) 0.4 MG CAPS capsule TAKE 1 CAPSULE BY MOUTH EVERY DAY 90 capsule 1   traZODone  (DESYREL ) 50 MG tablet Take 1 tablet (50 mg total) by mouth at bedtime. 90 tablet 1   XARELTO  20 MG TABS tablet TAKE 1 TABLET BY MOUTH EVERY DAY 60 tablet 5   No current facility-administered medications for this visit.     REVIEW OF SYSTEMS:    10 Point review of Systems was done is negative except as noted above.    PHYSICAL EXAMINATION: ECOG PERFORMANCE STATUS: 1 - Symptomatic but completely ambulatory   GENERAL:alert, in no acute distress and comfortable SKIN: no acute rashes, no significant lesions EYES: conjunctiva are pink and non-injected, sclera anicteric OROPHARYNX: MMM, no exudates, no oropharyngeal erythema or ulceration NECK: supple, no JVD LYMPH:  no palpable lymphadenopathy in the cervical, axillary or inguinal regions LUNGS: clear to auscultation b/l with normal respiratory effort HEART: regular rate & rhythm ABDOMEN:  normoactive bowel sounds , non tender, not distended. Extremity: no pedal edema PSYCH: alert & oriented x 3 with fluent speech NEURO: no focal motor/sensory deficits   LABORATORY DATA:  I have reviewed the data as listed     Latest Ref Rng & Units 08/31/2023    4:32  PM  08/12/2023    4:22 PM 08/04/2022    2:59 PM  CBC  WBC 4.0 - 10.5 K/uL 4.8  6.3  4.5   Hemoglobin 13.0 - 17.0 g/dL 16.1  09.6  04.5   Hematocrit 39.0 - 52.0 % 50.1  53.7  52.4   Platelets 150 - 400 K/uL 111  127  127        Latest Ref Rng & Units 08/31/2023    4:32 PM 08/12/2023    4:22 PM 03/16/2023    2:27 PM  CMP  Glucose 70 - 99 mg/dL 409  811  914   BUN 8 - 27 mg/dL 15  13  14    Creatinine 0.76 - 1.27 mg/dL 7.82  9.56  2.13   Sodium 134 - 144 mmol/L 138  138  135   Potassium 3.5 - 5.2 mmol/L 3.8  3.6  3.6   Chloride 96 - 106 mmol/L 98  95  95   CO2 20 - 29 mmol/L 26  27  23    Calcium  8.6 - 10.2 mg/dL 9.5  08.6  9.2   Total Protein 6.0 - 8.5 g/dL 6.2     Total Bilirubin 0.0 - 1.2 mg/dL 0.8     Alkaline Phos 44 - 121 IU/L 48     AST 0 - 40 IU/L 25     ALT 0 - 44 IU/L 17       RADIOGRAPHIC STUDIES: I have personally reviewed the radiological images as listed and agreed with the findings in the report. ECHOCARDIOGRAM COMPLETE Result Date: 10/01/2023    ECHOCARDIOGRAM REPORT   Patient Name:   Jonathon [name] Date of Exam: 10/01/2023 Medical Rec #:  [id number]         Height:       72.0 in Accession #:    [id number]        Weight:       213.0 lb Date of Birth:  Apr 17, 1951        BSA:          2.188 m Patient Age:    72 years          BP:           121/81 mmHg Patient Gender: M                 HR:           58 bpm. Exam Location:  Outpatient Procedure: 2D Echo, 3D Echo, Cardiac Doppler and Color Doppler (Both Spectral            and Color Flow Doppler were utilized during procedure). Indications:    Atrial Fibrillation  History:        Patient has prior history of Echocardiogram examinations, most                 recent 05/30/2020. PAD, Arrythmias:Atrial Fibrillation; Risk                 Factors:Hypertension, Former Smoker, Dyslipidemia and Diabetes.  Sonographer:    Gelene Kelly RDCS Referring Phys: [id number] JULIE ANNE WILLIAMS IMPRESSIONS  1. Left ventricular ejection fraction, by estimation,  is 50 to 55%. The left ventricle has low normal function. The left ventricle demonstrates global hypokinesis. Left ventricular diastolic function could not be evaluated.  2. Right ventricular systolic function mild to moderately reduced. The right ventricular size is normal. Mildly increased right ventricular wall thickness.  3. The mitral valve is degenerative. Trivial mitral valve  regurgitation. No evidence of mitral stenosis.  4. The aortic valve is tricuspid. Aortic valve regurgitation is not visualized. Aortic valve sclerosis is present, with no evidence of aortic valve stenosis.  5. Aortic dilatation noted. There is dilatation of the ascending aorta, measuring 40 mm. There is dilatation of the aortic root, measuring 40 mm.  6. The inferior vena cava is normal in size with greater than 50% respiratory variability, suggesting right atrial pressure of 3 mmHg. Comparison(s): A prior study was performed on 05/30/2020. No significant change from prior study. FINDINGS  Left Ventricle: Left ventricular ejection fraction, by estimation, is 50 to 55%. The left ventricle has low normal function. The left ventricle demonstrates global hypokinesis. Strain was performed and the global longitudinal strain is indeterminate. 3D  ejection fraction reviewed and evaluated as part of the interpretation. Alternate measurement of EF is felt to be most reflective of LV function. The left ventricular internal cavity size was normal in size. There is no left ventricular hypertrophy of the basal-septal segment. Left ventricular diastolic function could not be evaluated due to atrial fibrillation. Left ventricular diastolic function could not be evaluated. Right Ventricle: The right ventricular size is normal. Mildly increased right ventricular wall thickness. Right ventricular systolic function mild to moderately reduced. Left Atrium: Left atrial size was normal in size. Right Atrium: Right atrial size was normal in size. Pericardium:  There is no evidence of pericardial effusion. Mitral Valve: The mitral valve is degenerative in appearance. Trivial mitral valve regurgitation. No evidence of mitral valve stenosis. Tricuspid Valve: The tricuspid valve is grossly normal. Tricuspid valve regurgitation is not demonstrated. No evidence of tricuspid stenosis. Aortic Valve: The aortic valve is tricuspid. Aortic valve regurgitation is not visualized. Aortic valve sclerosis is present, with no evidence of aortic valve stenosis. Pulmonic Valve: The pulmonic valve was grossly normal. Pulmonic valve regurgitation is not visualized. No evidence of pulmonic stenosis. Aorta: Aortic dilatation noted. There is dilatation of the ascending aorta, measuring 40 mm. There is dilatation of the aortic root, measuring 40 mm. Venous: The inferior vena cava is normal in size with greater than 50% respiratory variability, suggesting right atrial pressure of 3 mmHg. IAS/Shunts: The atrial septum is grossly normal.  LEFT VENTRICLE PLAX 2D LVIDd:         4.12 cm   Diastology LVIDs:         3.06 cm   LV e' medial:    6.74 cm/s LV PW:         1.34 cm   LV E/e' medial:  14.1 LV IVS:        0.96 cm   LV e' lateral:   8.81 cm/s LVOT diam:     2.20 cm   LV E/e' lateral: 10.8 LV SV:         41 LV SV Index:   19 LVOT Area:     3.80 cm                           3D Volume EF:                          3D EF:        75 %                          LV EDV:       152 ml  LV ESV:       38 ml                          LV SV:        114 ml RIGHT VENTRICLE RV Basal diam:  3.96 cm RV Mid diam:    3.09 cm RV S prime:     8.27 cm/s TAPSE (M-mode): 1.1 cm LEFT ATRIUM             Index        RIGHT ATRIUM           Index LA diam:        5.10 cm 2.33 cm/m   RA Area:     20.40 cm LA Vol (A2C):   64.7 ml 29.57 ml/m  RA Volume:   57.50 ml  26.28 ml/m LA Vol (A4C):   64.5 ml 29.47 ml/m LA Biplane Vol: 69.1 ml 31.58 ml/m  AORTIC VALVE LVOT Vmax:   50.60 cm/s LVOT Vmean:  33.200  cm/s LVOT VTI:    0.108 m  AORTA Ao Root diam: 4.00 cm Ao Asc diam:  4.00 cm MITRAL VALVE MV Area (PHT): 4.49 cm    SHUNTS MV Decel Time: 169 msec    Systemic VTI:  0.11 m MV E velocity: 94.90 cm/s  Systemic Diam: 2.20 cm MV A velocity: 41.90 cm/s MV E/A ratio:  2.26 Sunit Tolia Electronically signed by Olinda Bertrand Signature Date/Time: 10/01/2023/5:38:39 PM    Final     US  ext venous :06/08/2019 IMPRESSION: Nonocclusive thrombus in the distal right femoral vein. The remainder of the deep venous system appears is patent without thrombus.   Electronically Signed: By: Allena Ito M.D. On: 06/08/2019 19:52   ASSESSMENT & PLAN:    Leeandre Nordling is a 73 y.o. 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 S/p embolectomy. Polycythemia   PLAN: -Discussed pt labwork, 07/27/19; Prothrombin  gene and Factor 5 leiden mutations are Negative, Cardiolipin antibodies, IgG, IgM, IgA are all WNL, Beta-2-glycoprotein i abs, IgG/M/A are all WNL, dRVVT Mix at 72.9, dRVVT Confirm at 1.9, PTT Lupus Anticoagulant at 51.0, DRVVT at 136.2.  -Lupus anticoagulant is elevated, but pt currently on Xarelto . Will need to be retested off of Xarelto .  -Advised pt that there were different causes for his two blood clots. The clot in his right femoral artery was likely embolic from a fib and the clot in his right femoral vein was likely due to his testosterone  replacement.  -Recommend pt discontinue hormone therapy. Recommend pt discuss Testosterone  with PCP. -Patient will need ongoing anticoagulation with Xarelto  for his atrial fibrillation irrespective of whether the DVT requires long term anticoagulation. -Recommend pt repeat Lupus Anticoagulant  with PCP in 3-4 months (off of Xarelto  for 48h) to  confirm finding -Recommend pt stay up to date with age appropriate cancer screenings with PCP -Continue 20 mg Xarelto  daily -Will see back as needed   FOLLOW UP ***  The total time spent in the appointment was *** minutes* .  All of the patient's questions were answered with apparent satisfaction. The patient knows to call the clinic with any problems, questions or concerns.   Jacquelyn Matt MD MS AAHIVMS [facility]  Hematology/Oncology Physician [facility]  .*Total Encounter Time as defined by the Centers for Medicare and Medicaid Services includes, in addition to the face-to-face time of a patient visit (documented in the note above) non-face-to-face time: obtaining and reviewing outside history, ordering and reviewing medications, tests or procedures, care coordination (communications with other health care professionals or caregivers) and documentation in the medical record.    I,Mitra Faeizi,acting as a Neurosurgeon for Jacquelyn Matt, MD.,have documented all relevant documentation on the behalf of Jacquelyn Matt, MD,as directed by  Jacquelyn Matt, MD while in the presence of Jacquelyn Matt, MD.  ***

## 2023-10-14 ENCOUNTER — Ambulatory Visit (INDEPENDENT_AMBULATORY_CARE_PROVIDER_SITE_OTHER): Admitting: Podiatry

## 2023-10-14 ENCOUNTER — Inpatient Hospital Stay: Admitting: Hematology

## 2023-10-14 ENCOUNTER — Inpatient Hospital Stay

## 2023-10-14 DIAGNOSIS — L97512 Non-pressure chronic ulcer of other part of right foot with fat layer exposed: Secondary | ICD-10-CM | POA: Diagnosis not present

## 2023-10-14 DIAGNOSIS — E1142 Type 2 diabetes mellitus with diabetic polyneuropathy: Secondary | ICD-10-CM | POA: Diagnosis not present

## 2023-10-14 MED ORDER — DOXYCYCLINE HYCLATE 100 MG PO TABS: 100.0000 mg | ORAL_TABLET | Freq: Two times a day (BID) | ORAL | 0 refills | Status: DC

## 2023-10-14 NOTE — Progress Notes (Signed)
 Subjective:  Patient ID: Jonathon [name], male    DOB: 1950/06/30,  MRN: [id number]  Chief Complaint  Patient presents with   Foot Ulcer    73 y.o. male presents for wound care.  Patient presents with right second digit ulceration has been present for quite some time.  He is a diabetic with last A1c of 5.6.  He was treated by Dr. Zettie Hillock for local wound care for quite some time greater than 4 weeks.  He wanted to discuss other treatment options.  He would like to discuss synthetic skin grafting.  He does not have any circulation problems.  He does not smoke.   Review of Systems: Negative except as noted in the HPI. Denies N/V/F/Ch.  Past Medical History:  Diagnosis Date   Anemia due to blood loss, acute 2014   2nd GIB   Anxiety    CKD (chronic kidney disease), stage III (HCC) 2014   Diabetes mellitus without complication (HCC)    Gout    High cholesterol    Hypertension 2014   Hypothyroid    Neuropathy    PUD (peptic ulcer disease) 2014    Current Outpatient Medications:    azithromycin (ZITHROMAX) 250 MG tablet, Take by mouth., Disp: , Rfl:    doxycycline  (VIBRA -TABS) 100 MG tablet, Take 1 tablet (100 mg total) by mouth 2 (two) times daily., Disp: 20 tablet, Rfl: 0   prednisoLONE acetate (PRED FORTE) 1 % ophthalmic suspension, INSTILL 4 DROPS A DAY IN BOTH EYES FOR TWO WEEKS AND THEN 2 DROPS A DAY IN BOTH EYES FOR FOUR WEEKS, Disp: , Rfl:    amLODipine  (NORVASC ) 5 MG tablet, Take 1 tablet (5 mg total) by mouth daily., Disp: 30 tablet, Rfl: 11   ammonium lactate  (AMLACTIN DAILY) 12 % lotion, Apply 1 Application topically as needed., Disp: 400 g, Rfl: 0   aspirin  EC 81 MG EC tablet, Take 1 tablet (81 mg total) by mouth daily., Disp:  , Rfl:    atorvastatin  (LIPITOR ) 80 MG tablet, TAKE 1 TABLET BY MOUTH EVERYDAY AT BEDTIME, Disp: 90 tablet, Rfl: 2   Cholecalciferol (VITAMIN D3) 1000 units CAPS, Take 1 capsule by mouth daily., Disp: 30 capsule, Rfl: 2   collagenase  (SANTYL ) 250  UNIT/GM ointment, Apply 1 Application topically daily., Disp: 15 g, Rfl: 0   fenofibrate  54 MG tablet, TAKE 1 TABLET(54 MG) BY MOUTH DAILY, Disp: 90 tablet, Rfl: 1   finasteride  (PROSCAR ) 5 MG tablet, TAKE 1 TABLET (5 MG TOTAL) BY MOUTH DAILY., Disp: 90 tablet, Rfl: 3   levothyroxine  (SYNTHROID ) 112 MCG tablet, Take 1 tablet (112 mcg total) by mouth at bedtime., Disp: 90 tablet, Rfl: 3   Magnesium  250 MG TABS, Take 1 tablet (250 mg total) by mouth daily. (Patient not taking: Reported on 09/03/2023), Disp: 30 tablet, Rfl: 0   magnesium  oxide (MAG-OX) 400 MG tablet, Take 1 tablet by mouth daily., Disp: , Rfl:    metoprolol  succinate (TOPROL -XL) 25 MG 24 hr tablet, TAKE 1 TABLET (25 MG TOTAL) BY MOUTH DAILY., Disp: 90 tablet, Rfl: 3   pantoprazole  (PROTONIX ) 40 MG tablet, TAKE 1 TABLET BY MOUTH EVERY DAY, Disp: 90 tablet, Rfl: 1   pregabalin  (LYRICA ) 75 MG capsule, Take 2 capsules (150 mg total) by mouth at bedtime., Disp: 180 capsule, Rfl: 1   sertraline  (ZOLOFT ) 100 MG tablet, Take 1 tablet (100 mg total) by mouth daily., Disp: 90 tablet, Rfl: 4   solifenacin  (VESICARE ) 10 MG tablet, Take 1 tablet (10 mg total)  by mouth daily., Disp: 30 tablet, Rfl: 11   tamsulosin  (FLOMAX ) 0.4 MG CAPS capsule, TAKE 1 CAPSULE BY MOUTH EVERY DAY, Disp: 90 capsule, Rfl: 1   traZODone  (DESYREL ) 50 MG tablet, Take 1 tablet (50 mg total) by mouth at bedtime., Disp: 90 tablet, Rfl: 1   XARELTO  20 MG TABS tablet, TAKE 1 TABLET BY MOUTH EVERY DAY, Disp: 60 tablet, Rfl: 5  Social History   Tobacco Use  Smoking Status Former   Current packs/day: 2.00   Average packs/day: 2.0 packs/day for 3.0 years (6.0 ttl pk-yrs)   Types: Cigarettes  Smokeless Tobacco Former   Quit date: 05/08/1967    Allergies  Allergen Reactions   Morphine  And Codeine Itching   Objective:  There were no vitals filed for this visit. There is no height or weight on file to calculate BMI. Constitutional Well developed. Well nourished.  Vascular  Dorsalis pedis pulses palpable bilaterally. Posterior tibial pulses palpable bilaterally. Capillary refill normal to all digits.  No cyanosis or clubbing noted. Pedal hair growth normal.  Neurologic Normal speech. Oriented to person, place, and time. Protective sensation absent  Dermatologic Wound Location: Right second digit ulceration probing down to deep tissue.  Redness noted up to the MPJ. Wound Base: Mixed Granular/Fibrotic Fibrotic slough Peri-wound: Calloused Exudate: Scant/small amount Serosanguinous exudate Wound Measurements: - See above  Orthopedic: No pain to palpation either foot.   Radiographs: None Assessment:   1. Skin ulcer of second toe of right foot with fat layer exposed (HCC)   2. Diabetic peripheral neuropathy associated with type 2 diabetes mellitus (HCC)     Plan:  Patient was evaluated and treated and all questions answered.  Ulcer right second digit ulceration fat layer exposed - Patient presents with right second digit ulceration.  Patient had undergone 1 graft application.  However today patient presents with clinical signs of infection with redness.  He will benefit from Dalvance IV antibiotics to avoid hospitalization.  Oral antibiotics in the past which has not helped including doxycycline .  I will prescribe doxycycline  interim until patient is set up with Dalvance IV infusion. - Patient is a high risk of undergoing right second digit amputation for now we will hold off on graft application - I discussed with the emergency room if he gets clinical signs of infection that worsens past redness at the MPJ patient will go to the emergency room right away he states understanding  No follow-ups on file.

## 2023-10-14 NOTE — Telephone Encounter (Signed)
 I called and spoke to the patients spouse, I advised her of your previous message.

## 2023-10-15 ENCOUNTER — Inpatient Hospital Stay (HOSPITAL_COMMUNITY)

## 2023-10-15 ENCOUNTER — Emergency Department (HOSPITAL_COMMUNITY)

## 2023-10-15 ENCOUNTER — Telehealth: Payer: Self-pay | Admitting: Hematology

## 2023-10-15 ENCOUNTER — Inpatient Hospital Stay (HOSPITAL_COMMUNITY): Admission: EM | Admit: 2023-10-15 | Discharge: 2023-11-27 | DRG: 040 | Disposition: E

## 2023-10-15 ENCOUNTER — Encounter (HOSPITAL_COMMUNITY): Payer: Self-pay | Admitting: Pulmonary Disease

## 2023-10-15 DIAGNOSIS — E1140 Type 2 diabetes mellitus with diabetic neuropathy, unspecified: Secondary | ICD-10-CM | POA: Diagnosis present

## 2023-10-15 DIAGNOSIS — Z452 Encounter for adjustment and management of vascular access device: Secondary | ICD-10-CM | POA: Diagnosis not present

## 2023-10-15 DIAGNOSIS — E870 Hyperosmolality and hypernatremia: Secondary | ICD-10-CM | POA: Diagnosis not present

## 2023-10-15 DIAGNOSIS — I6200 Nontraumatic subdural hemorrhage, unspecified: Secondary | ICD-10-CM | POA: Diagnosis not present

## 2023-10-15 DIAGNOSIS — L97509 Non-pressure chronic ulcer of other part of unspecified foot with unspecified severity: Secondary | ICD-10-CM | POA: Diagnosis not present

## 2023-10-15 DIAGNOSIS — R32 Unspecified urinary incontinence: Secondary | ICD-10-CM | POA: Diagnosis present

## 2023-10-15 DIAGNOSIS — M869 Osteomyelitis, unspecified: Secondary | ICD-10-CM | POA: Diagnosis present

## 2023-10-15 DIAGNOSIS — R7881 Bacteremia: Secondary | ICD-10-CM | POA: Diagnosis present

## 2023-10-15 DIAGNOSIS — E878 Other disorders of electrolyte and fluid balance, not elsewhere classified: Secondary | ICD-10-CM | POA: Diagnosis not present

## 2023-10-15 DIAGNOSIS — J9601 Acute respiratory failure with hypoxia: Secondary | ICD-10-CM | POA: Diagnosis not present

## 2023-10-15 DIAGNOSIS — I4819 Other persistent atrial fibrillation: Secondary | ICD-10-CM | POA: Diagnosis not present

## 2023-10-15 DIAGNOSIS — I10 Essential (primary) hypertension: Secondary | ICD-10-CM | POA: Diagnosis not present

## 2023-10-15 DIAGNOSIS — S7001XA Contusion of right hip, initial encounter: Secondary | ICD-10-CM | POA: Diagnosis present

## 2023-10-15 DIAGNOSIS — E119 Type 2 diabetes mellitus without complications: Secondary | ICD-10-CM | POA: Diagnosis not present

## 2023-10-15 DIAGNOSIS — E785 Hyperlipidemia, unspecified: Secondary | ICD-10-CM | POA: Diagnosis not present

## 2023-10-15 DIAGNOSIS — B370 Candidal stomatitis: Secondary | ICD-10-CM | POA: Diagnosis present

## 2023-10-15 DIAGNOSIS — I609 Nontraumatic subarachnoid hemorrhage, unspecified: Secondary | ICD-10-CM | POA: Diagnosis not present

## 2023-10-15 DIAGNOSIS — Z9889 Other specified postprocedural states: Secondary | ICD-10-CM | POA: Diagnosis not present

## 2023-10-15 DIAGNOSIS — F10930 Alcohol use, unspecified with withdrawal, uncomplicated: Secondary | ICD-10-CM | POA: Diagnosis not present

## 2023-10-15 DIAGNOSIS — R29721 NIHSS score 21: Secondary | ICD-10-CM | POA: Diagnosis not present

## 2023-10-15 DIAGNOSIS — R4701 Aphasia: Secondary | ICD-10-CM | POA: Diagnosis not present

## 2023-10-15 DIAGNOSIS — S80211A Abrasion, right knee, initial encounter: Secondary | ICD-10-CM | POA: Diagnosis present

## 2023-10-15 DIAGNOSIS — I509 Heart failure, unspecified: Secondary | ICD-10-CM | POA: Diagnosis not present

## 2023-10-15 DIAGNOSIS — M19071 Primary osteoarthritis, right ankle and foot: Secondary | ICD-10-CM | POA: Diagnosis not present

## 2023-10-15 DIAGNOSIS — E1122 Type 2 diabetes mellitus with diabetic chronic kidney disease: Secondary | ICD-10-CM | POA: Diagnosis present

## 2023-10-15 DIAGNOSIS — J690 Pneumonitis due to inhalation of food and vomit: Secondary | ICD-10-CM | POA: Diagnosis not present

## 2023-10-15 DIAGNOSIS — R404 Transient alteration of awareness: Secondary | ICD-10-CM | POA: Diagnosis not present

## 2023-10-15 DIAGNOSIS — I619 Nontraumatic intracerebral hemorrhage, unspecified: Secondary | ICD-10-CM | POA: Diagnosis not present

## 2023-10-15 DIAGNOSIS — G935 Compression of brain: Secondary | ICD-10-CM | POA: Diagnosis present

## 2023-10-15 DIAGNOSIS — L89316 Pressure-induced deep tissue damage of right buttock: Secondary | ICD-10-CM | POA: Diagnosis present

## 2023-10-15 DIAGNOSIS — S3993XA Unspecified injury of pelvis, initial encounter: Secondary | ICD-10-CM | POA: Diagnosis not present

## 2023-10-15 DIAGNOSIS — E1169 Type 2 diabetes mellitus with other specified complication: Secondary | ICD-10-CM | POA: Diagnosis present

## 2023-10-15 DIAGNOSIS — I69391 Dysphagia following cerebral infarction: Secondary | ICD-10-CM | POA: Diagnosis not present

## 2023-10-15 DIAGNOSIS — R1310 Dysphagia, unspecified: Secondary | ICD-10-CM | POA: Diagnosis present

## 2023-10-15 DIAGNOSIS — J984 Other disorders of lung: Secondary | ICD-10-CM | POA: Diagnosis not present

## 2023-10-15 DIAGNOSIS — R569 Unspecified convulsions: Secondary | ICD-10-CM | POA: Diagnosis not present

## 2023-10-15 DIAGNOSIS — I611 Nontraumatic intracerebral hemorrhage in hemisphere, cortical: Principal | ICD-10-CM | POA: Diagnosis present

## 2023-10-15 DIAGNOSIS — I615 Nontraumatic intracerebral hemorrhage, intraventricular: Secondary | ICD-10-CM | POA: Diagnosis not present

## 2023-10-15 DIAGNOSIS — M5030 Other cervical disc degeneration, unspecified cervical region: Secondary | ICD-10-CM | POA: Diagnosis not present

## 2023-10-15 DIAGNOSIS — I130 Hypertensive heart and chronic kidney disease with heart failure and stage 1 through stage 4 chronic kidney disease, or unspecified chronic kidney disease: Secondary | ICD-10-CM | POA: Diagnosis present

## 2023-10-15 DIAGNOSIS — I38 Endocarditis, valve unspecified: Secondary | ICD-10-CM | POA: Diagnosis not present

## 2023-10-15 DIAGNOSIS — I517 Cardiomegaly: Secondary | ICD-10-CM | POA: Diagnosis not present

## 2023-10-15 DIAGNOSIS — S06343A Traumatic hemorrhage of right cerebrum with loss of consciousness of 1 hours to 5 hours 59 minutes, initial encounter: Secondary | ICD-10-CM

## 2023-10-15 DIAGNOSIS — Z87891 Personal history of nicotine dependence: Secondary | ICD-10-CM | POA: Diagnosis not present

## 2023-10-15 DIAGNOSIS — M4802 Spinal stenosis, cervical region: Secondary | ICD-10-CM | POA: Diagnosis not present

## 2023-10-15 DIAGNOSIS — Z7982 Long term (current) use of aspirin: Secondary | ICD-10-CM

## 2023-10-15 DIAGNOSIS — I480 Paroxysmal atrial fibrillation: Secondary | ICD-10-CM | POA: Diagnosis present

## 2023-10-15 DIAGNOSIS — K090 Developmental odontogenic cysts: Secondary | ICD-10-CM | POA: Diagnosis not present

## 2023-10-15 DIAGNOSIS — E1165 Type 2 diabetes mellitus with hyperglycemia: Secondary | ICD-10-CM | POA: Diagnosis present

## 2023-10-15 DIAGNOSIS — M160 Bilateral primary osteoarthritis of hip: Secondary | ICD-10-CM | POA: Diagnosis not present

## 2023-10-15 DIAGNOSIS — E1151 Type 2 diabetes mellitus with diabetic peripheral angiopathy without gangrene: Secondary | ICD-10-CM | POA: Diagnosis present

## 2023-10-15 DIAGNOSIS — I700 Atherosclerosis of aorta: Secondary | ICD-10-CM | POA: Diagnosis not present

## 2023-10-15 DIAGNOSIS — L97519 Non-pressure chronic ulcer of other part of right foot with unspecified severity: Secondary | ICD-10-CM | POA: Diagnosis not present

## 2023-10-15 DIAGNOSIS — Z89411 Acquired absence of right great toe: Secondary | ICD-10-CM

## 2023-10-15 DIAGNOSIS — Z66 Do not resuscitate: Secondary | ICD-10-CM | POA: Diagnosis not present

## 2023-10-15 DIAGNOSIS — Z043 Encounter for examination and observation following other accident: Secondary | ICD-10-CM | POA: Diagnosis not present

## 2023-10-15 DIAGNOSIS — Z8719 Personal history of other diseases of the digestive system: Secondary | ICD-10-CM

## 2023-10-15 DIAGNOSIS — S065XAA Traumatic subdural hemorrhage with loss of consciousness status unknown, initial encounter: Secondary | ICD-10-CM | POA: Diagnosis present

## 2023-10-15 DIAGNOSIS — Z9911 Dependence on respirator [ventilator] status: Secondary | ICD-10-CM

## 2023-10-15 DIAGNOSIS — R600 Localized edema: Secondary | ICD-10-CM | POA: Diagnosis not present

## 2023-10-15 DIAGNOSIS — R0989 Other specified symptoms and signs involving the circulatory and respiratory systems: Secondary | ICD-10-CM | POA: Diagnosis not present

## 2023-10-15 DIAGNOSIS — E039 Hypothyroidism, unspecified: Secondary | ICD-10-CM | POA: Diagnosis present

## 2023-10-15 DIAGNOSIS — M7989 Other specified soft tissue disorders: Secondary | ICD-10-CM | POA: Diagnosis not present

## 2023-10-15 DIAGNOSIS — R29722 NIHSS score 22: Secondary | ICD-10-CM | POA: Diagnosis present

## 2023-10-15 DIAGNOSIS — Z79890 Hormone replacement therapy (postmenopausal): Secondary | ICD-10-CM

## 2023-10-15 DIAGNOSIS — F419 Anxiety disorder, unspecified: Secondary | ICD-10-CM | POA: Diagnosis present

## 2023-10-15 DIAGNOSIS — W19XXXA Unspecified fall, initial encounter: Secondary | ICD-10-CM | POA: Diagnosis not present

## 2023-10-15 DIAGNOSIS — Z7901 Long term (current) use of anticoagulants: Secondary | ICD-10-CM | POA: Diagnosis not present

## 2023-10-15 DIAGNOSIS — S299XXA Unspecified injury of thorax, initial encounter: Secondary | ICD-10-CM | POA: Diagnosis not present

## 2023-10-15 DIAGNOSIS — R0902 Hypoxemia: Secondary | ICD-10-CM | POA: Diagnosis not present

## 2023-10-15 DIAGNOSIS — Z4682 Encounter for fitting and adjustment of non-vascular catheter: Secondary | ICD-10-CM | POA: Diagnosis not present

## 2023-10-15 DIAGNOSIS — L03031 Cellulitis of right toe: Secondary | ICD-10-CM | POA: Diagnosis present

## 2023-10-15 DIAGNOSIS — N1832 Chronic kidney disease, stage 3b: Secondary | ICD-10-CM | POA: Diagnosis present

## 2023-10-15 DIAGNOSIS — L8990 Pressure ulcer of unspecified site, unspecified stage: Secondary | ICD-10-CM | POA: Insufficient documentation

## 2023-10-15 DIAGNOSIS — S199XXA Unspecified injury of neck, initial encounter: Secondary | ICD-10-CM | POA: Diagnosis not present

## 2023-10-15 DIAGNOSIS — Z8711 Personal history of peptic ulcer disease: Secondary | ICD-10-CM

## 2023-10-15 DIAGNOSIS — G9341 Metabolic encephalopathy: Secondary | ICD-10-CM | POA: Diagnosis present

## 2023-10-15 DIAGNOSIS — S066X0A Traumatic subarachnoid hemorrhage without loss of consciousness, initial encounter: Secondary | ICD-10-CM | POA: Diagnosis not present

## 2023-10-15 DIAGNOSIS — Z79899 Other long term (current) drug therapy: Secondary | ICD-10-CM

## 2023-10-15 DIAGNOSIS — Z885 Allergy status to narcotic agent status: Secondary | ICD-10-CM

## 2023-10-15 DIAGNOSIS — E876 Hypokalemia: Secondary | ICD-10-CM | POA: Diagnosis present

## 2023-10-15 DIAGNOSIS — I63512 Cerebral infarction due to unspecified occlusion or stenosis of left middle cerebral artery: Secondary | ICD-10-CM | POA: Diagnosis not present

## 2023-10-15 DIAGNOSIS — I6389 Other cerebral infarction: Secondary | ICD-10-CM | POA: Diagnosis not present

## 2023-10-15 DIAGNOSIS — R29720 NIHSS score 20: Secondary | ICD-10-CM | POA: Diagnosis not present

## 2023-10-15 DIAGNOSIS — I4891 Unspecified atrial fibrillation: Secondary | ICD-10-CM | POA: Diagnosis not present

## 2023-10-15 DIAGNOSIS — R0689 Other abnormalities of breathing: Secondary | ICD-10-CM | POA: Diagnosis not present

## 2023-10-15 DIAGNOSIS — G936 Cerebral edema: Secondary | ICD-10-CM | POA: Diagnosis present

## 2023-10-15 DIAGNOSIS — N183 Chronic kidney disease, stage 3 unspecified: Secondary | ICD-10-CM | POA: Diagnosis not present

## 2023-10-15 DIAGNOSIS — Z6829 Body mass index (BMI) 29.0-29.9, adult: Secondary | ICD-10-CM

## 2023-10-15 DIAGNOSIS — F17210 Nicotine dependence, cigarettes, uncomplicated: Secondary | ICD-10-CM | POA: Diagnosis present

## 2023-10-15 DIAGNOSIS — Z833 Family history of diabetes mellitus: Secondary | ICD-10-CM

## 2023-10-15 DIAGNOSIS — Z89421 Acquired absence of other right toe(s): Secondary | ICD-10-CM | POA: Diagnosis not present

## 2023-10-15 DIAGNOSIS — D684 Acquired coagulation factor deficiency: Secondary | ICD-10-CM | POA: Diagnosis not present

## 2023-10-15 DIAGNOSIS — Z515 Encounter for palliative care: Secondary | ICD-10-CM

## 2023-10-15 DIAGNOSIS — I110 Hypertensive heart disease with heart failure: Secondary | ICD-10-CM | POA: Diagnosis not present

## 2023-10-15 DIAGNOSIS — I129 Hypertensive chronic kidney disease with stage 1 through stage 4 chronic kidney disease, or unspecified chronic kidney disease: Secondary | ICD-10-CM | POA: Diagnosis not present

## 2023-10-15 DIAGNOSIS — Z7902 Long term (current) use of antithrombotics/antiplatelets: Secondary | ICD-10-CM | POA: Diagnosis not present

## 2023-10-15 DIAGNOSIS — R140 Abdominal distension (gaseous): Secondary | ICD-10-CM | POA: Diagnosis not present

## 2023-10-15 DIAGNOSIS — R918 Other nonspecific abnormal finding of lung field: Secondary | ICD-10-CM | POA: Diagnosis not present

## 2023-10-15 DIAGNOSIS — F1210 Cannabis abuse, uncomplicated: Secondary | ICD-10-CM | POA: Diagnosis not present

## 2023-10-15 DIAGNOSIS — R509 Fever, unspecified: Secondary | ICD-10-CM | POA: Diagnosis not present

## 2023-10-15 DIAGNOSIS — S065X0A Traumatic subdural hemorrhage without loss of consciousness, initial encounter: Secondary | ICD-10-CM | POA: Diagnosis not present

## 2023-10-15 DIAGNOSIS — S06310A Contusion and laceration of right cerebrum without loss of consciousness, initial encounter: Secondary | ICD-10-CM | POA: Diagnosis not present

## 2023-10-15 DIAGNOSIS — I771 Stricture of artery: Secondary | ICD-10-CM | POA: Diagnosis not present

## 2023-10-15 DIAGNOSIS — E7800 Pure hypercholesterolemia, unspecified: Secondary | ICD-10-CM | POA: Diagnosis present

## 2023-10-15 DIAGNOSIS — K8020 Calculus of gallbladder without cholecystitis without obstruction: Secondary | ICD-10-CM | POA: Diagnosis not present

## 2023-10-15 DIAGNOSIS — S99921A Unspecified injury of right foot, initial encounter: Secondary | ICD-10-CM | POA: Diagnosis not present

## 2023-10-15 DIAGNOSIS — L89012 Pressure ulcer of right elbow, stage 2: Secondary | ICD-10-CM | POA: Diagnosis not present

## 2023-10-15 DIAGNOSIS — R29818 Other symptoms and signs involving the nervous system: Secondary | ICD-10-CM | POA: Diagnosis not present

## 2023-10-15 DIAGNOSIS — Z8673 Personal history of transient ischemic attack (TIA), and cerebral infarction without residual deficits: Secondary | ICD-10-CM

## 2023-10-15 DIAGNOSIS — I639 Cerebral infarction, unspecified: Secondary | ICD-10-CM | POA: Diagnosis not present

## 2023-10-15 DIAGNOSIS — F10139 Alcohol abuse with withdrawal, unspecified: Secondary | ICD-10-CM | POA: Diagnosis present

## 2023-10-15 DIAGNOSIS — I6502 Occlusion and stenosis of left vertebral artery: Secondary | ICD-10-CM | POA: Diagnosis not present

## 2023-10-15 DIAGNOSIS — J9600 Acute respiratory failure, unspecified whether with hypoxia or hypercapnia: Secondary | ICD-10-CM | POA: Diagnosis not present

## 2023-10-15 DIAGNOSIS — E440 Moderate protein-calorie malnutrition: Secondary | ICD-10-CM | POA: Diagnosis not present

## 2023-10-15 DIAGNOSIS — R000 Tachycardia, unspecified: Secondary | ICD-10-CM | POA: Diagnosis not present

## 2023-10-15 DIAGNOSIS — E11621 Type 2 diabetes mellitus with foot ulcer: Secondary | ICD-10-CM | POA: Diagnosis present

## 2023-10-15 DIAGNOSIS — Z86718 Personal history of other venous thrombosis and embolism: Secondary | ICD-10-CM

## 2023-10-15 DIAGNOSIS — J9690 Respiratory failure, unspecified, unspecified whether with hypoxia or hypercapnia: Secondary | ICD-10-CM | POA: Diagnosis not present

## 2023-10-15 DIAGNOSIS — M86171 Other acute osteomyelitis, right ankle and foot: Secondary | ICD-10-CM | POA: Diagnosis not present

## 2023-10-15 LAB — I-STAT ARTERIAL BLOOD GAS, ED
Acid-Base Excess: 4 mmol/L — ABNORMAL HIGH (ref 0.0–2.0)
Bicarbonate: 26.1 mmol/L (ref 20.0–28.0)
Calcium, Ion: 1.14 mmol/L — ABNORMAL LOW (ref 1.15–1.40)
HCT: 42 % (ref 39.0–52.0)
Hemoglobin: 14.3 g/dL (ref 13.0–17.0)
O2 Saturation: 100 %
Patient temperature: 98.8
Potassium: 2.7 mmol/L — CL (ref 3.5–5.1)
Sodium: 134 mmol/L — ABNORMAL LOW (ref 135–145)
TCO2: 27 mmol/L (ref 22–32)
pCO2 arterial: 30.6 mmHg — ABNORMAL LOW (ref 32–48)
pH, Arterial: 7.54 — ABNORMAL HIGH (ref 7.35–7.45)
pO2, Arterial: 469 mmHg — ABNORMAL HIGH (ref 83–108)

## 2023-10-15 LAB — URINALYSIS, W/ REFLEX TO CULTURE (INFECTION SUSPECTED)
Bilirubin Urine: NEGATIVE
Glucose, UA: 150 mg/dL — AB
Ketones, ur: NEGATIVE mg/dL
Leukocytes,Ua: NEGATIVE
Nitrite: NEGATIVE
Protein, ur: 100 mg/dL — AB
Specific Gravity, Urine: 1.014 (ref 1.005–1.030)
pH: 7 (ref 5.0–8.0)

## 2023-10-15 LAB — COMPREHENSIVE METABOLIC PANEL WITH GFR
ALT: 12 U/L (ref 0–44)
AST: 28 U/L (ref 15–41)
Albumin: 3.7 g/dL (ref 3.5–5.0)
Alkaline Phosphatase: 37 U/L — ABNORMAL LOW (ref 38–126)
Anion gap: 16 — ABNORMAL HIGH (ref 5–15)
BUN: 10 mg/dL (ref 8–23)
CO2: 25 mmol/L (ref 22–32)
Calcium: 9.8 mg/dL (ref 8.9–10.3)
Chloride: 97 mmol/L — ABNORMAL LOW (ref 98–111)
Creatinine, Ser: 1.59 mg/dL — ABNORMAL HIGH (ref 0.61–1.24)
GFR, Estimated: 46 mL/min — ABNORMAL LOW (ref >60)
Glucose, Bld: 180 mg/dL — ABNORMAL HIGH (ref 70–99)
Potassium: 3.3 mmol/L — ABNORMAL LOW (ref 3.5–5.1)
Sodium: 138 mmol/L (ref 135–145)
Total Bilirubin: 1.1 mg/dL (ref 0.0–1.2)
Total Protein: 6.6 g/dL (ref 6.5–8.1)

## 2023-10-15 LAB — PLATELET COUNT: Platelets: 86 10*3/uL — ABNORMAL LOW (ref 150–400)

## 2023-10-15 LAB — MRSA NEXT GEN BY PCR, NASAL: MRSA by PCR Next Gen: NOT DETECTED

## 2023-10-15 LAB — I-STAT CHEM 8, ED
BUN: 11 mg/dL (ref 8–23)
Calcium, Ion: 0.95 mmol/L — ABNORMAL LOW (ref 1.15–1.40)
Chloride: 98 mmol/L (ref 98–111)
Creatinine, Ser: 1.5 mg/dL — ABNORMAL HIGH (ref 0.61–1.24)
Glucose, Bld: 172 mg/dL — ABNORMAL HIGH (ref 70–99)
HCT: 50 % (ref 39.0–52.0)
Hemoglobin: 17 g/dL (ref 13.0–17.0)
Potassium: 3.2 mmol/L — ABNORMAL LOW (ref 3.5–5.1)
Sodium: 137 mmol/L (ref 135–145)
TCO2: 25 mmol/L (ref 22–32)

## 2023-10-15 LAB — PROTIME-INR
INR: 3.1 — ABNORMAL HIGH (ref 0.8–1.2)
INR: 2.3 — ABNORMAL HIGH (ref 0.8–1.2)
Prothrombin Time: 32.2 s — ABNORMAL HIGH (ref 11.4–15.2)
Prothrombin Time: 25.7 s — ABNORMAL HIGH (ref 11.4–15.2)

## 2023-10-15 LAB — CBC
HCT: 46.4 % (ref 39.0–52.0)
Hemoglobin: 15.7 g/dL (ref 13.0–17.0)
MCH: 31 pg (ref 26.0–34.0)
MCHC: 33.8 g/dL (ref 30.0–36.0)
MCV: 91.7 fL (ref 80.0–100.0)
Platelets: UNDETERMINED 10*3/uL (ref 150–400)
RBC: 5.06 MIL/uL (ref 4.22–5.81)
RDW: 15.5 % (ref 11.5–15.5)
WBC: 4.7 10*3/uL (ref 4.0–10.5)
nRBC: 0 % (ref 0.0–0.2)

## 2023-10-15 LAB — SAMPLE TO BLOOD BANK

## 2023-10-15 LAB — CK TOTAL AND CKMB (NOT AT ARMC)
CK, MB: 1.7 ng/mL (ref 0.5–5.0)
Total CK: 55 U/L (ref 49–397)

## 2023-10-15 LAB — GLUCOSE, CAPILLARY
Glucose-Capillary: 104 mg/dL — ABNORMAL HIGH (ref 70–99)
Glucose-Capillary: 82 mg/dL (ref 70–99)
Glucose-Capillary: 82 mg/dL (ref 70–99)

## 2023-10-15 LAB — SODIUM
Sodium: 136 mmol/L (ref 135–145)
Sodium: 144 mmol/L (ref 135–145)

## 2023-10-15 LAB — APTT: aPTT: 42 s — ABNORMAL HIGH (ref 24–36)

## 2023-10-15 LAB — ETHANOL: Alcohol, Ethyl (B): <15 mg/dL (ref <15)

## 2023-10-15 LAB — I-STAT CG4 LACTIC ACID, ED: Lactic Acid, Venous: 5.5 mmol/L (ref 0.5–1.9)

## 2023-10-15 LAB — CK: Total CK: 60 U/L (ref 49–397)

## 2023-10-15 MED ORDER — PROPOFOL 1000 MG/100ML IV EMUL
0.0000 ug/kg/min | INTRAVENOUS | Status: DC
  Administered 2023-10-15 (×2): 20 ug/kg/min via INTRAVENOUS
  Administered 2023-10-15: 10 ug/kg/min via INTRAVENOUS
  Administered 2023-10-16: 20 ug/kg/min via INTRAVENOUS
  Filled 2023-10-15 (×4): qty 100

## 2023-10-15 MED ORDER — SODIUM CHLORIDE 3 % IV SOLN
INTRAVENOUS | Status: DC
  Administered 2023-10-15: 75 mL/h via INTRAVENOUS
  Filled 2023-10-15 (×7): qty 500

## 2023-10-15 MED ORDER — SENNOSIDES-DOCUSATE SODIUM 8.6-50 MG PO TABS
1.0000 | ORAL_TABLET | Freq: Two times a day (BID) | ORAL | Status: DC
  Administered 2023-10-15 – 2023-10-16 (×2): 1
  Filled 2023-10-15 (×2): qty 1

## 2023-10-15 MED ORDER — CLEVIDIPINE BUTYRATE 0.5 MG/ML IV EMUL: 0.0000 mg/h | INTRAVENOUS | Status: DC

## 2023-10-15 MED ORDER — IOHEXOL 350 MG/ML SOLN
60.0000 mL | Freq: Once | INTRAVENOUS | Status: AC | PRN
  Administered 2023-10-15: 60 mL via INTRAVENOUS

## 2023-10-15 MED ORDER — PANTOPRAZOLE SODIUM 40 MG IV SOLR: 40.0000 mg | Freq: Every day | INTRAVENOUS | Status: DC

## 2023-10-15 MED ORDER — INSULIN ASPART 100 UNIT/ML IJ SOLN
0.0000 [IU] | INTRAMUSCULAR | Status: DC
  Administered 2023-10-16 – 2023-10-19 (×6): 1 [IU] via SUBCUTANEOUS
  Administered 2023-10-20 (×6): 2 [IU] via SUBCUTANEOUS
  Administered 2023-10-20: 3 [IU] via SUBCUTANEOUS
  Administered 2023-10-21: 2 [IU] via SUBCUTANEOUS

## 2023-10-15 MED ORDER — SODIUM CHLORIDE 0.9 % IV SOLN
INTRAVENOUS | Status: AC | PRN
  Administered 2023-10-15: 1000 mL via INTRAVENOUS

## 2023-10-15 MED ORDER — AMIODARONE LOAD VIA INFUSION
150.0000 mg | Freq: Once | INTRAVENOUS | Status: AC
  Administered 2023-10-15: 150 mg via INTRAVENOUS
  Filled 2023-10-15: qty 83.34

## 2023-10-15 MED ORDER — LEVETIRACETAM (KEPPRA) 500 MG/5 ML ADULT IV PUSH
2000.0000 mg | Freq: Once | INTRAVENOUS | Status: AC
  Administered 2023-10-15: 2000 mg via INTRAVENOUS
  Filled 2023-10-15: qty 20

## 2023-10-15 MED ORDER — LABETALOL HCL 5 MG/ML IV SOLN
INTRAVENOUS | Status: AC
  Filled 2023-10-15: qty 4

## 2023-10-15 MED ORDER — FENTANYL CITRATE PF 50 MCG/ML IJ SOSY: 25.0000 ug | PREFILLED_SYRINGE | Freq: Once | INTRAMUSCULAR | Status: DC

## 2023-10-15 MED ORDER — AMIODARONE HCL IN DEXTROSE 360-4.14 MG/200ML-% IV SOLN
60.0000 mg/h | INTRAVENOUS | Status: AC
  Administered 2023-10-15 (×2): 60 mg/h via INTRAVENOUS
  Filled 2023-10-15: qty 200

## 2023-10-15 MED ORDER — CLEVIDIPINE BUTYRATE 0.5 MG/ML IV EMUL
INTRAVENOUS | Status: AC
  Filled 2023-10-15: qty 50

## 2023-10-15 MED ORDER — ACETAMINOPHEN 160 MG/5ML PO SOLN
650.0000 mg | ORAL | Status: DC | PRN
  Administered 2023-10-19 – 2023-10-29 (×14): 650 mg
  Filled 2023-10-15 (×14): qty 20.3

## 2023-10-15 MED ORDER — ETOMIDATE 2 MG/ML IV SOLN
INTRAVENOUS | Status: AC | PRN
  Administered 2023-10-15: 20 mg via INTRAVENOUS

## 2023-10-15 MED ORDER — IOHEXOL 350 MG/ML SOLN
75.0000 mL | Freq: Once | INTRAVENOUS | Status: AC | PRN
  Administered 2023-10-15: 75 mL via INTRAVENOUS

## 2023-10-15 MED ORDER — HYDRALAZINE HCL 20 MG/ML IJ SOLN
20.0000 mg | INTRAMUSCULAR | Status: DC | PRN
  Administered 2023-10-16: 20 mg via INTRAVENOUS
  Filled 2023-10-15: qty 1

## 2023-10-15 MED ORDER — POTASSIUM CHLORIDE 20 MEQ PO PACK
40.0000 meq | PACK | Freq: Once | ORAL | Status: AC
  Administered 2023-10-15: 40 meq
  Filled 2023-10-15: qty 2

## 2023-10-15 MED ORDER — ROCURONIUM BROMIDE 10 MG/ML (PF) SYRINGE
PREFILLED_SYRINGE | INTRAVENOUS | Status: AC | PRN
  Administered 2023-10-15: 100 mg via INTRAVENOUS

## 2023-10-15 MED ORDER — FENTANYL 2500MCG IN NS 250ML (10MCG/ML) PREMIX INFUSION
0.0000 ug/h | INTRAVENOUS | Status: DC
  Administered 2023-10-15: 50 ug/h via INTRAVENOUS
  Administered 2023-10-16: 75 ug/h via INTRAVENOUS
  Filled 2023-10-15 (×3): qty 250

## 2023-10-15 MED ORDER — LEVETIRACETAM 500 MG PO TABS
500.0000 mg | ORAL_TABLET | Freq: Two times a day (BID) | ORAL | Status: DC
  Administered 2023-10-15 – 2023-10-16 (×2): 500 mg
  Filled 2023-10-15 (×2): qty 1

## 2023-10-15 MED ORDER — DOCUSATE SODIUM 50 MG/5ML PO LIQD: 100.0000 mg | Freq: Two times a day (BID) | ORAL | Status: DC

## 2023-10-15 MED ORDER — STROKE: EARLY STAGES OF RECOVERY BOOK
Freq: Once | Status: AC
  Filled 2023-10-15: qty 1

## 2023-10-15 MED ORDER — POLYETHYLENE GLYCOL 3350 17 G PO PACK: 17.0000 g | PACK | Freq: Every day | ORAL | Status: DC | PRN

## 2023-10-15 MED ORDER — PANTOPRAZOLE SODIUM 40 MG IV SOLR
40.0000 mg | Freq: Every day | INTRAVENOUS | Status: DC
  Administered 2023-10-15: 40 mg via INTRAVENOUS
  Filled 2023-10-15: qty 10

## 2023-10-15 MED ORDER — SENNOSIDES-DOCUSATE SODIUM 8.6-50 MG PO TABS: 1.0000 | ORAL_TABLET | Freq: Two times a day (BID) | ORAL | Status: DC

## 2023-10-15 MED ORDER — SODIUM CHLORIDE 3 % IV BOLUS
250.0000 mL | Freq: Once | INTRAVENOUS | Status: AC
  Administered 2023-10-15: 250 mL via INTRAVENOUS
  Filled 2023-10-15: qty 500

## 2023-10-15 MED ORDER — ORAL CARE MOUTH RINSE
15.0000 mL | OROMUCOSAL | Status: DC
  Administered 2023-10-15 – 2023-10-16 (×13): 15 mL via OROMUCOSAL

## 2023-10-15 MED ORDER — AMIODARONE HCL IN DEXTROSE 360-4.14 MG/200ML-% IV SOLN
30.0000 mg/h | INTRAVENOUS | Status: DC
  Administered 2023-10-15: 30 mg/h via INTRAVENOUS

## 2023-10-15 MED ORDER — ACETAMINOPHEN 650 MG RE SUPP: 650.0000 mg | RECTAL | Status: DC | PRN

## 2023-10-15 MED ORDER — ACETAMINOPHEN 325 MG PO TABS
650.0000 mg | ORAL_TABLET | ORAL | Status: DC | PRN
  Administered 2023-10-18 – 2023-10-20 (×4): 650 mg via ORAL
  Filled 2023-10-15 (×4): qty 2

## 2023-10-15 MED ORDER — FENTANYL BOLUS VIA INFUSION
25.0000 ug | INTRAVENOUS | Status: DC | PRN
  Administered 2023-10-15: 50 ug via INTRAVENOUS

## 2023-10-15 MED ORDER — LABETALOL HCL 5 MG/ML IV SOLN
20.0000 mg | INTRAVENOUS | Status: DC | PRN
  Administered 2023-10-18 – 2023-10-28 (×2): 20 mg via INTRAVENOUS
  Filled 2023-10-15 (×3): qty 4

## 2023-10-15 MED ORDER — CHLORHEXIDINE GLUCONATE CLOTH 2 % EX PADS
6.0000 | MEDICATED_PAD | Freq: Every day | CUTANEOUS | Status: DC
  Administered 2023-10-15 – 2023-10-29 (×17): 6 via TOPICAL

## 2023-10-15 MED ORDER — PROTHROMBIN COMPLEX CONC HUMAN 500 UNITS IV KIT
4660.0000 [IU] | PACK | Status: AC
  Administered 2023-10-15: 4660 [IU] via INTRAVENOUS
  Filled 2023-10-15: qty 4660

## 2023-10-15 MED ORDER — ORAL CARE MOUTH RINSE
15.0000 mL | OROMUCOSAL | Status: DC | PRN
Start: 2023-10-15 — End: 2023-10-29

## 2023-10-15 MED ORDER — LEVETIRACETAM (KEPPRA) 500 MG/5 ML ADULT IV PUSH
2500.0000 mg | Freq: Once | INTRAVENOUS | Status: AC
  Administered 2023-10-15: 2500 mg via INTRAVENOUS
  Filled 2023-10-15: qty 25

## 2023-10-15 MED ORDER — FAMOTIDINE 20 MG PO TABS: 20.0000 mg | ORAL_TABLET | Freq: Two times a day (BID) | ORAL | Status: DC

## 2023-10-15 MED ORDER — POLYETHYLENE GLYCOL 3350 17 G PO PACK
17.0000 g | PACK | Freq: Every day | ORAL | Status: DC
  Administered 2023-10-16: 17 g
  Filled 2023-10-15: qty 1

## 2023-10-15 MED ORDER — SODIUM CHLORIDE 0.9 % IV SOLN
3.0000 g | Freq: Four times a day (QID) | INTRAVENOUS | Status: DC
  Administered 2023-10-15 – 2023-10-21 (×23): 3 g via INTRAVENOUS
  Filled 2023-10-15 (×24): qty 8

## 2023-10-15 MED ORDER — DOCUSATE SODIUM 100 MG PO CAPS: 100.0000 mg | ORAL_CAPSULE | Freq: Two times a day (BID) | ORAL | Status: DC | PRN

## 2023-10-15 NOTE — Consult Note (Signed)
 NEUROLOGY CONSULT NOTE   Date of service: October 15, 2023 [name] MRN:  [id number] DOB:  [birth year] Chief Complaint: Coleridge Davenport on thinners  Requesting Provider: Nolberto Batty, DO  History of Present Illness  Narvel Kozub is a 73 y.o. male with hx of anemia, afib on xarleto, anxiety, CKD stage 3, DM without complication, HLD, HTN, Hypothyroidism, PUD presenting as a level 2 trauma. He took his meds around 11pm and then his wife went to be around midnight. She heard his up around 2am but wasn't concerned and then at 7am she found him on the ground incontinent and shaking. He was given 5mg  of versed  by EMS due to combativeness. He is also on doxycycline  for a toe infection. HR 200s with wide complex QRS, amio bolus given and drip started. GCS 6. Intubated on arrival and then taken for CT. CT Head shows an acute intraparenchymal hematoma within the right lateral temporal lobe with surrounding vasogenic edema and 5 mm midline shift and scattered subarachnoid hemorrhages. He is on xarelto  and has been reversed with [facility] Neurosurgery consulted.   LKW: 11pm Modified rankin score: 0-Completely asymptomatic and back to baseline post- stroke IV Thrombolysis: No, ICH EVT: No, no LVO ICH Score: 2   ROS   Unable to ascertain due to altered mental status   Past History   Past Medical History:  Diagnosis Date   Anemia due to blood loss, acute 2014   2nd GIB   Anxiety    CKD (chronic kidney disease), stage III (HCC) 2014   Diabetes mellitus without complication (HCC)    Gout    High cholesterol    Hypertension 2014   Hypothyroid    Neuropathy    PUD (peptic ulcer disease) 2014    Past Surgical History:  Procedure Laterality Date   ABDOMINAL AORTOGRAM W/LOWER EXTREMITY Bilateral 06/27/2019   Procedure: ABDOMINAL AORTOGRAM W/LOWER EXTREMITY;  Surgeon: Richrd Char, MD;  Location: [facility];  Service: Vascular;  Laterality: Bilateral;    ADRENALECTOMY     AMPUTATION Right 07/18/2019   Procedure: AMPUTATION RIGHT GREAT TOE;  Surgeon: Young Hensen, MD;  Location: [facility] OR;  Service: Vascular;  Laterality: Right;   BIOPSY  09/09/2021   Procedure: BIOPSY;  Surgeon: Annis Kinder, DO;  Location: [facility] ENDOS[facility];  Service: Gastroenterology;;   COLONOSCOPY WITH PROPOFOL  N/A 09/09/2021   Procedure: COLONOSCOPY WITH PROPOFOL ;  Surgeon: Annis Kinder, DO;  Location: [facility];  Service: Gastroenterology;  Laterality: N/A;   COLONOSCOPY WITH PROPOFOL  N/A 09/20/2021   Procedure: COLONOSCOPY WITH PROPOFOL ;  Surgeon: Sergio Dandy, MD;  Location: [facility]OS[facility];  Service: Gastroenterology;  Laterality: N/A;   EMBOLECTOMY Right 06/27/2019   Procedure: RIGHT LEG EMBOLECTOMY;  Surgeon: Richrd Char, MD;  Location: [facility] OR;  Service: Vascular;  Laterality: Right;   ESOPHAGOGASTRODUODENOSCOPY (EGD) WITH PROPOFOL  N/A 09/09/2021   Procedure: ESOPHAGOGASTRODUODENOSCOPY (EGD) WITH PROPOFOL ;  Surgeon: Annis Kinder, DO;  Location: [facility] END[facility];  Service: Gastroenterology;  Laterality: N/A;   HEMOSTASIS CLIP PLACEMENT  09/20/2021   Procedure: HEMOSTASIS CLIP PLACEMENT;  Surgeon: Sergio Dandy, MD;  Location: [facility];  Service: Gastroenterology;;   POLYPECTOMY  09/09/2021   Procedure: POLYPECTOMY;  Surgeon: Annis Kinder, DO;  Location: [facility] ENDOS[facility];  Service: Gastroenterology;;  EGD and COLON    Family History: Family History  Problem Relation Age of Onset   Diabetes Mellitus II Maternal Aunt    Lung cancer Mother    Pancreatic cancer  Sister     Social History  reports that he has quit smoking. His smoking use included cigarettes. He has a 6 pack-year smoking history. He quit smokeless tobacco use about 56 years ago. He reports current alcohol use of about 14.0 standard drinks of alcohol per week. He reports current drug use. Drug: Marijuana.  Allergies  Allergen Reactions   Morphine  And Codeine Itching     Medications   Current Facility-Administered Medications:    0.9 %  sodium chloride  infusion, , Intravenous, Code/Trauma/Sedation Continuous Med, Nolberto Batty, DO, Last Rate: 999 mL/hr at 10/15/23 0910, 1,000 mL at 10/15/23 0910   [COMPLETED] amiodarone (NEXTERONE) 1.8 mg/mL load via infusion 150 mg, 150 mg, Intravenous, Once, 150 mg at 10/15/23 0910 **FOLLOWED BY** amiodarone (NEXTERONE PREMIX) 360-4.14 MG/200ML-% (1.8 mg/mL) IV infusion, 60 mg/hr, Intravenous, Continuous, Last Rate: 33.3 mL/hr at 10/15/23 0910, 60 mg/hr at 10/15/23 0910 **FOLLOWED BY** amiodarone (NEXTERONE PREMIX) 360-4.14 MG/200ML-% (1.8 mg/mL) IV infusion, 30 mg/hr, Intravenous, Continuous, Kingsley, Victoria K, DO   clevidipine (CLEVIPREX) infusion 0.5 mg/mL, 0-21 mg/hr, Intravenous, Continuous, Shafer, Devon, NP   docusate (COLACE) 50 MG/5ML liquid 100 mg, 100 mg, Per Tube, BID, Kingsley, Victoria K, DO   etomidate (AMIDATE) injection, , Intravenous, Code/Trauma/Sedation Med, Nora Beal, Victoria K, DO, 20 mg at 10/15/23 4098   fentaNYL  (SUBLIMAZE ) bolus via infusion 25-100 mcg, 25-100 mcg, Intravenous, Q15 min PRN, Kingsley, Victoria K, DO   fentaNYL  (SUBLIMAZE ) injection 25-50 mcg, 25-50 mcg, Intravenous, Once, Kingsley, Victoria K, DO   fentaNYL  in NS (77mcg/ml) infusion-PREMIX, 0-400 mcg/hr, Intravenous, Continuous, Kingsley, Victoria K, DO, Last Rate: 5 mL/hr at 10/15/23 0922, 50 mcg/hr at 10/15/23 1191   levETIRAcetam (KEPPRA) undiluted injection 2,000 mg, 2,000 mg, Intravenous, Once, Imogene Mana, NP   polyethylene glycol (MIRALAX / GLYCOLAX) packet 17 g, 17 g, Per Tube, Daily, Kingsley, Victoria K, DO   propofol  (DIPRIVAN ) 1000 MG/100ML infusion, 0-80 mcg/kg/min, Intravenous, Continuous, Kingsley, Victoria K, DO, Last Rate: 17.53 mL/hr at 10/15/23 1035, 30 mcg/kg/min at 10/15/23 1035   rocuronium  (ZEMURON ) injection, , Intravenous, Code/Trauma/Sedation Med, Nora Beal, Victoria K, DO, 100 mg at  10/15/23 4782  Current Outpatient Medications:    amLODipine  (NORVASC ) 5 MG tablet, Take 1 tablet (5 mg total) by mouth daily., Disp: 30 tablet, Rfl: 11   ammonium lactate  (AMLACTIN DAILY) 12 % lotion, Apply 1 Application topically as needed., Disp: 400 g, Rfl: 0   aspirin  EC 81 MG EC tablet, Take 1 tablet (81 mg total) by mouth daily., Disp:  , Rfl:    atorvastatin  (LIPITOR ) 80 MG tablet, TAKE 1 TABLET BY MOUTH EVERYDAY AT BEDTIME, Disp: 90 tablet, Rfl: 2   azithromycin (ZITHROMAX) 250 MG tablet, Take by mouth., Disp: , Rfl:    Cholecalciferol (VITAMIN D3) 1000 units CAPS, Take 1 capsule by mouth daily., Disp: 30 capsule, Rfl: 2   collagenase  (SANTYL ) 250 UNIT/GM ointment, Apply 1 Application topically daily., Disp: 15 g, Rfl: 0   doxycycline  (VIBRA -TABS) 100 MG tablet, Take 1 tablet (100 mg total) by mouth 2 (two) times daily., Disp: 20 tablet, Rfl: 0   fenofibrate  54 MG tablet, TAKE 1 TABLET(54 MG) BY MOUTH DAILY, Disp: 90 tablet, Rfl: 1   finasteride  (PROSCAR ) 5 MG tablet, TAKE 1 TABLET (5 MG TOTAL) BY MOUTH DAILY., Disp: 90 tablet, Rfl: 3   levothyroxine  (SYNTHROID ) 112 MCG tablet, Take 1 tablet (112 mcg total) by mouth at bedtime., Disp: 90 tablet, Rfl: 3   Magnesium  250 MG TABS, Take 1 tablet (250  mg total) by mouth daily. (Patient not taking: Reported on 09/03/2023), Disp: 30 tablet, Rfl: 0   magnesium  oxide (MAG-OX) 400 MG tablet, Take 1 tablet by mouth daily., Disp: , Rfl:    metoprolol  succinate (TOPROL -XL) 25 MG 24 hr tablet, TAKE 1 TABLET (25 MG TOTAL) BY MOUTH DAILY., Disp: 90 tablet, Rfl: 3   pantoprazole  (PROTONIX ) 40 MG tablet, TAKE 1 TABLET BY MOUTH EVERY DAY, Disp: 90 tablet, Rfl: 1   prednisoLONE acetate (PRED FORTE) 1 % ophthalmic suspension, INSTILL 4 DROPS A DAY IN BOTH EYES FOR TWO WEEKS AND THEN 2 DROPS A DAY IN BOTH EYES FOR FOUR WEEKS, Disp: , Rfl:    pregabalin  (LYRICA ) 75 MG capsule, Take 2 capsules (150 mg total) by mouth at bedtime., Disp: 180 capsule, Rfl: 1    sertraline  (ZOLOFT ) 100 MG tablet, Take 1 tablet (100 mg total) by mouth daily., Disp: 90 tablet, Rfl: 4   solifenacin  (VESICARE ) 10 MG tablet, Take 1 tablet (10 mg total) by mouth daily., Disp: 30 tablet, Rfl: 11   tamsulosin  (FLOMAX ) 0.4 MG CAPS capsule, TAKE 1 CAPSULE BY MOUTH EVERY DAY, Disp: 90 capsule, Rfl: 1   traZODone  (DESYREL ) 50 MG tablet, Take 1 tablet (50 mg total) by mouth at bedtime., Disp: 90 tablet, Rfl: 1   XARELTO  20 MG TABS tablet, TAKE 1 TABLET BY MOUTH EVERY DAY, Disp: 60 tablet, Rfl: 5  Vitals   Vitals:   November 12, 2023 0950 11-12-2023 0953 November 12, 2023 1008 12-Nov-2023 1010  BP: (!) 120/108 (!) 142/110 (!) 128/105 (!) 157/130  Pulse: (!) 54 (!) 107 (!) 56 (!) 105  Resp: 20 20 20  (!) 22  Temp:      TempSrc:      SpO2: 100% 100% 100% 99%  Weight:      Height:        Body mass index is 29.12 kg/m.   Physical Exam   Constitutional: Appears well-developed and well-nourished.  Psych: Unresponsive Eyes: No scleral injection.  HENT: No OP obstruction.  Head: Normocephalic.  Cardiovascular: Irregular, afib on the monitor Respiratory: Mechanically ventilated  GI: Soft.  No distension. There is no tenderness.  Skin: WDI.   Neurologic Examination   Neuro: Mental Status: Intubated, sedated (propofol , fentanyl ) No following commands, eyes intermittently open Cranial Nerves: II: Does not blink to threat, pupils 3mm  III,IV, VI: eyes midline  V: Facial sensation is symmetric to temperature VII: Facial movement is grossly symmetric VIII: Hearing is intact to voice X: Gag intact XI: Head is midline, c-collar in place XII: ETT Motor: Tone is normal. Bulk is normal Spontaneous movements but not purposeful Sensory: Withdraws in all extremities  Cerebellar: Deferred   Labs/Imaging/Neurodiagnostic studies   CBC:  Recent Labs  Lab 12-Nov-2023 0920  HGB 17.0  HCT 50.0   Basic Metabolic Panel:  Lab Results  Component Value Date   NA 137 2023/11/12   K 3.2 (L)  12-Nov-2023   CO2 26 08/31/2023   GLUCOSE 172 (H) 11/12/23   BUN 11 12-Nov-2023   CREATININE 1.50 (H) 11/12/2023   CALCIUM  9.5 08/31/2023   GFRNONAA >60 09/21/2021   GFRAA >60 07/19/2019   Lipid Panel:  Lab Results  Component Value Date   LDLCALC 54 08/31/2023   HgbA1c:  Lab Results  Component Value Date   HGBA1C 5.6 08/31/2023   Urine Drug Screen:     Component Value Date/Time   LABOPIA NONE DETECTED 07/18/2019 1600   COCAINSCRNUR NONE DETECTED 07/18/2019 1600   LABBENZ POSITIVE (A) 07/18/2019  1600   AMPHETMU NONE DETECTED 07/18/2019 1600   THCU POSITIVE (A) 07/18/2019 1600   LABBARB NONE DETECTED 07/18/2019 1600    Alcohol Level     Component Value Date/Time   [employer] <15 10/15/2023 0903   INR  Lab Results  Component Value Date   INR 1.4 (H) 09/20/2021   APTT  Lab Results  Component Value Date   APTT 78 (H) 06/27/2019   AED levels: No results found for: PHENYTOIN, ZONISAMIDE, LAMOTRIGINE, LEVETIRACETA  CT Head without contrast(Personally reviewed): Acute intraparenchymal hematoma within the right lateral temporal lobe with surrounding vasogenic edema and mass effect, with shift of the midline structures to the left by approximately 5 mm.  CT angio Head and Neck with contrast(Personally reviewed): 1. Negative for CTA spot sign, intracranial aneurysm, or evidence of vascular malformation in association with the large right hemisphere intra-axial hematoma. Large vessel occlusion, but suspected intracranial Vasospasm. 2. Furthermore, a right side Subdural Hematoma (estimated at 4 mm) is suspected in addition to the intra-axial and subarachnoid blood.Leftward midline shift of 5-6 mm does not appear significantly changed since 1033 hours. 3. 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 No other significant atherosclerotic stenosis in the head or neck.   6/5 Echocardiogram: EF 50-55%  Neurodiagnostics rEEG:   Pending   ASSESSMENT   Lizandro Spellman is a 73 y.o. male presenting with a right temporal lobe hemorrhage and scattered subarachnoid hemorrhage. He is on xarelto  at baseline for afib, reversed with [facility] in the ED. Admit to Neuro ICU. LTM started due to concern for seizure activity and loaded with 4.5g of keppra. Propofol  and fentanyl  for sedation. Cleviprex and labetalol  for BP control. HR well controlled with amiodarone at this time. Will start hypertonic saline at 75ml/hr with a bolus. Plan for a repeat head CT at 1400  RECOMMENDATIONS  - BP 130-150 for 24 hours and then BP goal less than 160  - Cleviprex IV and labetalol  PRN - Keppra 4.5g load - Sedation per CCM- fentanyl  and propofol  - HTS at 71ml/hr; bolus 250cc completed in ED - q6 Na per protocol with Na goal 145-155 - Repeat head CT at 1400 per neurosurgery  - MRI tomorrow - LTM connected- MRI leads used ______________________________________________________________________    Arletha Bend, NP [facility]   NEUROHOSPITALIST ADDENDUM Performed a face to face diagnostic evaluation.   I have reviewed the contents of history and physical exam as documented by PA/ARNP/Resident and agree with above documentation.  I have discussed and formulated the above plan as documented. Edits to the note have been made as needed.  Impression/Key exam findings/Plan: initially presented as a trauma alert and intubated. Found to have ICH which appears to be more consistent with a primary hemorrhage rather than traumatic ICH. However, has some overlying SAH and SDH which is odd for a nontraumatic ICH. He took Xarelto  last night and this was reversed by trauma team with Neil Balls and was done prior to neurology involvement. We would have preferred Andexxa. I suspect that ICH likely secondary to hypertension and Xarelto . Very possible that he had some left sided neglect and that could have let to delay in identification  of symptoms. Was noted to have seizure like activity prompting wife to call EMS. Was given Versed  and intubated on arrival.  We gave him 4500mg  of Keppra, started on cEEG, bolus of hypertonic saline 250cc, followed by 43ml/hr with goal sodium of 150-155. Cleviprex to keep SBP between  130-150. CTA with no spot sign.  Repeat CT Head at 1400. Neurosurgery has been consulted.  I had detailed discussion with his wife and daughter at the bedside. Patient is intubated and sedated. Only known allergy is to morphine .  Code status discussed with wife, no CPR, meds, shocks if cardiac arrest. Do everything prior to cardiac arrest. Okay with short term intubation but not long term.  This patient is critically ill and at significant risk of neurological worsening, death and care requires constant monitoring of vital signs, hemodynamics,respiratory and cardiac monitoring, neurological assessment, discussion with family, other specialists and medical decision making of high complexity. I spent 75 minutes of neurocritical care time  in the care of  this patient. This was time spent independent of any time provided by nurse practitioner or PA.  Kaikoa Magro [facility] 10/15/2023  3:32 PM   Roxan Copes, MD [facility] [phone]   If 7pm to 7am, please call on call as listed on AMION.

## 2023-10-15 NOTE — Telephone Encounter (Signed)
 Returned the patients phone call to cancel and reschedule yesterdays appointment. The patients wife informed me that she was currently on the way to the ED with the patient and she would call back. I have informed the nurse.

## 2023-10-15 NOTE — TOC CAGE-AID Note (Signed)
 Transition of Care [facility]) - CAGE-AID Screening   Patient Details  Name: Calil Amor MRN: [id number] Date of Birth: [birth year]  [facility] of [facility] CM/SW Contact:    Ruta Capece E Raeanne Deschler, LCSW Phone Number: 10/15/2023, 12:39 PM   Clinical Narrative: Currently intubated.   CAGE-AID Screening: Substance Abuse Screening unable to be completed due to: : Patient unable to participate

## 2023-10-15 NOTE — Progress Notes (Signed)
 SLP Cancellation Note  Patient Details Name: Jonathon [name] MRN: [id number] DOB: February 13, 1951   Cancelled treatment:        Orders for cognitive linguistic evaluation received and appreciated.  Per chart review, pt is intubated at this time and is unable to participate.   SLP will continue to follow for assessment when medically ready. Consider orders for swallow evaluation post extubation if indicated.    Elester Grim, MA, CCC-SLP Acute Rehabilitation Services Office: [phone] 10/15/2023, 12:43 PM

## 2023-10-15 NOTE — Consult Note (Addendum)
 Jonathon [name] 1950-10-17  [id number].     Requesting MD: Nora Beal, MD Chief Complaint/Reason for Consult: fall on thinners, GCS 5  HPI:  Jonathon [name] is a 73 y/o M who was originally a level 2 trauma but upgraded to a level 1 trauma after a fall on thinners with GCS of 5. Per EMS he had an unwitnessed fall sometime last night around midnight, he was able to get back in the chair, and then became less responsive early this morning. He takes Xarelto  for a.fib, last dose 11 PM yesterday. Per EMS he was agitated and moving all 4 extremities on scene, non-verbal. He has been in afib with RVR, hypertensive. He was given versed  and transported to the ED where he was intubated for airway protection due to GCS6 [phone]).   Unable to obtain history/ROS due to critical illness Per EMS he has a chronic toe wound and is on doxycycline    ROS: Review of Systems  Unable to perform ROS: Critical illness    Family History  Problem Relation Age of Onset   Diabetes Mellitus II Maternal Aunt    Lung cancer Mother    Pancreatic cancer Sister     Past Medical History:  Diagnosis Date   Anemia due to blood loss, acute 2014   2nd GIB   Anxiety    CKD (chronic kidney disease), stage III (HCC) 2014   Diabetes mellitus without complication (HCC)    Gout    High cholesterol    Hypertension 2014   Hypothyroid    Neuropathy    PUD (peptic ulcer disease) 2014    Past Surgical History:  Procedure Laterality Date   ABDOMINAL AORTOGRAM W/LOWER EXTREMITY Bilateral 06/27/2019   Procedure: ABDOMINAL AORTOGRAM W/LOWER EXTREMITY;  Surgeon: Richrd Char, MD;  [facility];  Service: Vascular;  Laterality: Bilateral;   ADRENALECTOMY     AMPUTATION Right 07/18/2019   Procedure: AMPUTATION RIGHT GREAT TOE;  Surgeon: Young Hensen, MD;  Location: [facility] OR;  Service: Vascular;  Laterality: Right;   BIOPSY  09/09/2021   Procedure: BIOPSY;  Surgeon: Annis Kinder, DO;   [facility];  Service: Gastroenterology;;   COLONOSCOPY WITH PROPOFOL  N/A 09/09/2021   Procedure: COLONOSCOPY WITH PROPOFOL ;  Surgeon: Annis Kinder, DO;  [facility];  Service: Gastroenterology;  Laterality: N/A;   COLONOSCOPY WITH PROPOFOL  N/A 09/20/2021   Procedure: COLONOSCOPY WITH PROPOFOL ;  Surgeon: Sergio Dandy, MD;  [facility];  Service: Gastroenterology;  Laterality: N/A;   EMBOLECTOMY Right 06/27/2019   Procedure: RIGHT LEG EMBOLECTOMY;  Surgeon: Richrd Char, MD;  Location: [facility] OR;  Service: Vascular;  Laterality: Right;   ESOPHAGOGASTRODUODENOSCOPY (EGD) WITH PROPOFOL  N/A 09/09/2021   Procedure: ESOPHAGOGASTRODUODENOSCOPY (EGD) WITH PROPOFOL ;  Surgeon: Annis Kinder, DO;  [facility];  Service: Gastroenterology;  Laterality: N/A;   HEMOSTASIS CLIP PLACEMENT  09/20/2021   Procedure: HEMOSTASIS CLIP PLACEMENT;  Surgeon: Sergio Dandy, MD;  [facility];  Service: Gastroenterology;;   POLYPECTOMY  09/09/2021   Procedure: POLYPECTOMY;  Surgeon: Annis Kinder, DO;  [facility];  Service: Gastroenterology;;  EGD and COLON    Social History:  reports that he has quit smoking. His smoking use included cigarettes. He has a 6 pack-year smoking history. He quit smokeless tobacco use about 56 years ago. He reports current alcohol use of about 14.0 standard drinks of alcohol per week. He reports current drug use. Drug: Marijuana.  Allergies:  Allergies  Allergen Reactions   Morphine  And Codeine Itching    (Not in a hospital admission)    Physical Exam: Blood pressure (!) 133/109, pulse (!) 55, temperature 99 F (37.2 C), temperature source Axillary, resp. rate 14, SpO2 92%. General: laying on hospital bed, appears stated age, NAD. HEENT: head -normocephalic, contisuion over R scalp, no laceration; Eyes: pupils 4 mm bilaterally, sluggish but responsive, no conjunctival injection; Ears- no external  lesions or tenderness, TM visible with no redness or bulging;  Throat: pink mucosa, uvula midline, no exudates.  Neck- Trachea is midline CV- irregularly irregular - afib 140-180 bpm, no M/R/G, radial and dorsalis pedis pulses 2+ BL Pulm- ventilated respirations, CTABL, no wheezes, rhales, rhonchi. Abd- soft, NT/ND, appropriate bowel sounds in 4 quadrants, no masses, hernias, or organomegaly. GU- deferred  MSK- BUE without deformity Right hip and proximal thigh contusion, R shin abrasion, R second toe wound LLE without wound or deformity  Neuro- prior to receiving etomidate and rocuronium  for intubation he was GCS 6 - mumbled once but otherwise not making sounds, no eye opening, moving all 4 extremities. Psych- unable to assess Skin: warm and dry   Results for orders placed or performed during the hospital encounter of 10/15/23 (from the past 48 hours)  Sample to Blood Bank     Status: None (Preliminary result)   Collection Time: 10/15/23  9:15 AM  Result Value Ref Range   Blood Bank Specimen SAMPLE AVAILABLE FOR TESTING    Sample Expiration      10/18/2023,2359 Performed at [facility], [address] [place], [place] [address]   I-Stat Chem 8, ED     Status: Abnormal   Collection Time: 10/15/23  9:20 AM  Result Value Ref Range   Sodium 137 135 - 145 mmol/L   Potassium 3.2 (L) 3.5 - 5.1 mmol/L   Chloride 98 98 - 111 mmol/L   BUN 11 8 - 23 mg/dL   Creatinine, Ser 0.86 (H) 0.61 - 1.24 mg/dL   Glucose, Bld 578 (H) 70 - 99 mg/dL    Comment: Glucose reference range applies only to samples taken after fasting for at least 8 hours.   Calcium , Ion 0.95 (L) 1.15 - 1.40 mmol/L   TCO2 25 22 - 32 mmol/L   Hemoglobin 17.0 13.0 - 17.0 g/dL   HCT 46.9 62.9 - 52.8 %  I-Stat Lactic Acid, ED     Status: Abnormal   Collection Time: 10/15/23  9:20 AM  Result Value Ref Range   Lactic Acid, Venous 5.5 (HH) 0.5 - 1.9 mmol/L   Comment NOTIFIED PHYSICIAN    No results  found.    Assessment/Plan Intraparenchymal hemorrhage of right temporal lobe, scattered SAH, with 5 mm midline shift 73 y/o M with history of afib on Xarelto  (LD 6/18 2300) who had a fall around 12 PM and a few hours later developed decreased level of responsiveness and agitation. 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 K centra was ordered. Neurosurgery - Dr. Andy Bannister has been consulted; I spoke to his PA [place] at 9:44 AM and she presented to trauma B within 10 minutes. CTA head and CTA neck ordered. I have also paged neurology.   No significant traumatic injuries identified on CT of the chest/abdomen/pelvis. Possible aspiration on CT chest. He has a contusion of his right hip, no bony injuries or solid organ injuries.  Recommend CCM consult for hospital admission. No major traumatic injuries, trauma surgery will sign  off. Call as needed.  At present he has two peripheral IVs, ETT, and OG tube in place. Labs are pending. CTA head/neck pending.  Acute respiratory failure Afib with RVR - amio gtt HTN HLD CKD DM2 PUD  Total critical care time: 65 minutes  I reviewed nursing notes, ED provider notes, Consultant neurosurgery notes, last 24 h vitals and pain scores, last 48 h intake and output, last 24 h labs and trends, and last 24 h imaging results.  Charlott Converse, [facility] 10/15/2023, 9:33 AM Please see Amion for pager number during day hours 7:00am-4:30pm or 7:00am -11:30am on weekends

## 2023-10-15 NOTE — ED Notes (Incomplete)
 Trauma Response Nurse Documentation   Jonathon [name] is a 73 y.o. male arriving to [facility] ED via [facility]  On Xarelto  (rivaroxaban ) daily. Trauma was activated as a Level 2 by charge RN - upgraded to L1  based on the following trauma criteria GCS < 9.  Patient cleared for CT by Dr. Hildy Lowers. Pt transported to CT with trauma response nurse present to monitor. RN remained with the patient throughout their absence from the department for clinical observation.   GCS 5 prior to CT and intubation- did withdraw from pain, no other purposeful movement or vocalization.  Trauma MD Arrival Time: ***.  History   Past Medical History:  Diagnosis Date   Anemia due to blood loss, acute 2014   2nd GIB   Anxiety    CKD (chronic kidney disease), stage III (HCC) 2014   Diabetes mellitus without complication (HCC)    Gout    High cholesterol    Hypertension 2014   Hypothyroid    Neuropathy    PUD (peptic ulcer disease) 2014     Past Surgical History:  Procedure Laterality Date   ABDOMINAL AORTOGRAM W/LOWER EXTREMITY Bilateral 06/27/2019   Procedure: ABDOMINAL AORTOGRAM W/LOWER EXTREMITY;  Surgeon: Richrd Char, MD;  [facility];  Service: Vascular;  Laterality: Bilateral;   ADRENALECTOMY     AMPUTATION Right 07/18/2019   Procedure: AMPUTATION RIGHT GREAT TOE;  Surgeon: Young Hensen, MD;  Location: [facility] OR;  Service: Vascular;  Laterality: Right;   BIOPSY  09/09/2021   Procedure: BIOPSY;  Surgeon: Annis Kinder, DO;  [facility];  Service: Gastroenterology;;   COLONOSCOPY WITH PROPOFOL  N/A 09/09/2021   Procedure: COLONOSCOPY WITH PROPOFOL ;  Surgeon: Annis Kinder, DO;  [facility];  Service: Gastroenterology;  Laterality: N/A;   COLONOSCOPY WITH PROPOFOL  N/A 09/20/2021   Procedure: COLONOSCOPY WITH PROPOFOL ;  Surgeon: Sergio Dandy, MD;  [facility];  Service: Gastroenterology;  Laterality: N/A;   EMBOLECTOMY  Right 06/27/2019   Procedure: RIGHT LEG EMBOLECTOMY;  Surgeon: Richrd Char, MD;  Location[facility] OR;  Service: Vascular;  Laterality: Right;   ESOPHAGOGASTRODUODENOSCOPY (EGD) WITH PROPOFOL  N/A 09/09/2021   Procedure: ESOPHAGOGASTRODUODENOSCOPY (EGD) WITH PROPOFOL ;  Surgeon: Annis Kinder, DO;  [facility];  Service: Gastroenterology;  Laterality: N/A;   HEMOSTASIS CLIP PLACEMENT  09/20/2021   Procedure: HEMOSTASIS CLIP PLACEMENT;  Surgeon: Sergio Dandy, MD;  [facility];  Service: Gastroenterology;;   POLYPECTOMY  09/09/2021   Procedure: POLYPECTOMY;  Surgeon: Annis Kinder, DO;  [facility];  Service: Gastroenterology;;  EGD and COLON       Initial Focused Assessment (If applicable, or please see trauma documentation):   CT's Completed:   {Trauma CT:26866}   Interventions:   Plan for disposition:  {Trauma Dispo:26867}   Consults completed:  {Trauma Consults:26862} at ***.  Event Summary:  MTP Summary (If applicable):   Bedside handoff with {Trauma handoff:26863::ED RN} ***.    Jonathon [name]  Trauma Response RN  Please call TRN at [phone] for further assistance.

## 2023-10-15 NOTE — Progress Notes (Signed)
 RT transported pt on ventilator from Trauma B to CT without complications.

## 2023-10-15 NOTE — ED Notes (Signed)
 Intubation 25 at lip. 8.0 tube

## 2023-10-15 NOTE — Consult Note (Signed)
 WOC Nurse Consult Note: patient is followed by podiatry for R 2nd toe ulceration; last seen there 10/14/2023 (see note) and is considered high risk for amputation with plans for IV antibiotic use; as of 10/09/2023 they are utilizing Betadine wet to dry dressings on toe wound  Reason for Consult: R toe ulcer  Wound type: full thickness R 2nd digit likely diabetic in origin  Pressure Injury POA: NA  Measurement:see nursing flowsheet  Wound bed: 75% eschar 25% red  Drainage (amount, consistency, odor) see nursing flowsheet  Periwound: edema  Dressing procedure/placement/frequency: Cleanse R 2nd digit with Betadine, apply Betadine wet to dry dressing to wound bed daily, cover with dry gauze and secure with tape.    Patient should continue to follow with podiatry for this wound.  POC discussed with bedside nurse. WOC team will not follow. Re-consult if further needs arise.   Thank you,    Ronni Colace MSN, RN-BC, [employer]

## 2023-10-15 NOTE — Progress Notes (Signed)
 Pt was transported to CT scan and back to 4n23 without complications.

## 2023-10-15 NOTE — Progress Notes (Signed)
 Responded to  level 1 page to provide emotional and spiritual care.  Pt going to CT. Chaplain available as needed.  Anton Baton, [place], [facility], Pager [phone]

## 2023-10-15 NOTE — Progress Notes (Signed)
 Pt was transported to 4N23 without complications.

## 2023-10-15 NOTE — Progress Notes (Signed)
 Neurosurgery  Patient seen and examined earlier this morning at 11:25 am.  Repeat CT head performed recently was reviewed.  His right posterior temporoparietal ICH has been stable over past two scans. Although measuring 3.5 x 3 x 3 cm, there is only mild/moderate subfalcine shift of 4 mm (measured from center of third ventricle) and cisterns are widely patent.  As such, surgical hematoma evacuation is unlikely to improve his ultimate neurologic outcome and functional status, and in the context of acquired coagulopathy, has high risk of bleeding complications of surgery so in the balance it is more likely it would worsen his neurologic outcome. I do recommend keeping sodiums 145-155 to help minimize any perihematoma edema that may develop and worsen his mass effect.  I also recommend a f/u CT head in the morning in case he develops

## 2023-10-15 NOTE — H&P (Signed)
 NAME:  Jonathon [name], MRN:  [id number], DOB:  1950-06-08, LOS: 0 ADMISSION DATE:  10/15/2023, CONSULTATION DATE: 10/15/2023 REFERRING MD: Emergency department physician, CHIEF COMPLAINT: Intracerebral hemorrhage  History of Present Illness:  73 year old male who was found down by wife poorly responsive required intubation and transportation to the emergency.  CT scan revealed a right intraparenchymal hemorrhage with other areas of injury.  He is currently intubated in atrial fibrillation ventricular response on amiodarone drip being treated with fentanyl  and propofol  for tube tolerance.  He has been seen by neurosurgery and neurology.  Hyperatremia treatment we await the final results of CT scan of the head to see if there is any surgical options.  Pertinent  Medical History   Past Medical History:  Diagnosis Date   Anemia due to blood loss, acute 2014   2nd GIB   Anxiety    CKD (chronic kidney disease), stage III (HCC) 2014   Diabetes mellitus without complication (HCC)    Gout    High cholesterol    Hypertension 2014   Hypothyroid    Neuropathy    PUD (peptic ulcer disease) 2014     Significant Hospital Events: Including procedures, antibiotic start and stop dates in addition to other pertinent events     Interim History / Subjective:  Found down  Objective    Blood pressure (!) 157/130, pulse (!) 105, temperature 99 F (37.2 C), temperature source Axillary, resp. rate (!) 22, height 6' (1.829 m), weight 97.4 kg, SpO2 99%.    Vent Mode: PRVC FiO2 (%):  [100 %] 100 % Set Rate:  [20 bmp] 20 bmp Vt Set:  [600 mL-620 mL] 620 mL PEEP:  [5 cmH20] 5 cmH20 Plateau Pressure:  [15 cmH20] 15 cmH20   Intake/Output Summary (Last 24 hours) at 10/15/2023 1052 Last data filed at 10/15/2023 0950 Gross per 24 hour  Intake 500 ml  Output --  Net 500 ml   Filed Weights   10/15/23 0944  Weight: 97.4 kg    Examination: General: Does not follow commands shows withdrawal to  noxious stimuli HENT: Pupils are pinpoint but reactive Lungs: Coarse rhonchi bilaterally positive gag reflex Cardiovascular: Atrial fibrillation rapid ventricular response Abdomen: Obese soft nontender Extremities: Right right great toe fourth toe with what appears to be gangrene Neuro: Severely injured at this time GU: Foley catheter  Resolved problem list   Assessment and Plan  Ventilator dependent respiratory failure in the setting of right intraparenchymal hemorrhage and other areas of bleeding, large right temporal lobe bleed with vasogenic evidence already, there is also adjacent scattered subarachnoid hemorrhages primarily within the adjacent cerebral area.  He has been seen by neurology and neurosurgery questionable if he is a surgical candidate at this time.  Blood pressure control systolic of 160-130 Cleviprex Control atrial fibrillation amiodarone Sedation with propofol  and fentanyl  Full ventilatory support adjust as needed Ongoing discussions with family  Poorly controlled hypertension Currently Cleviprex has been started  Diabetes mellitus with poor control Sliding scale insulin  protocol  Peripheral vascular disease with loss of right great toe and neurological signs fourth toe monitor  Best Practice (right click and Reselect all SmartList Selections daily)   Diet/type: NPO DVT prophylaxis not indicated Pressure ulcer(s): present on admission  GI prophylaxis: PPI Lines: N/A Foley:  N/A Code Status:  full code Last date of multidisciplinary goals of care discussion [tbd]  Labs   CBC: Recent Labs  Lab 10/15/23 0915 10/15/23 0920  WBC 4.7  --   HGB 15.7  17.0  HCT 46.4 50.0  MCV 91.7  --   PLT PLATELET CLUMPS NOTED ON SMEAR, UNABLE TO ESTIMATE  --     Basic Metabolic Panel: Recent Labs  Lab 10/15/23 0920  NA 137  K 3.2*  CL 98  GLUCOSE 172*  BUN 11  CREATININE 1.50*   GFR: Estimated Creatinine Clearance: 53 mL/min (A) (by C-G formula based on  SCr of 1.5 mg/dL (H)). Recent Labs  Lab 10/15/23 0915 10/15/23 0920  WBC 4.7  --   LATICACIDVEN  --  5.5*    Liver Function Tests: No results for input(s): AST, ALT, ALKPHOS, BILITOT, PROT, ALBUMIN  in the last 168 hours. No results for input(s): LIPASE, AMYLASE in the last 168 hours. No results for input(s): AMMONIA in the last 168 hours.  ABG    Component Value Date/Time   PHART 7.544 (H) 09/08/2021 1225   PCO2ART 44.7 09/08/2021 1225   PO2ART 134 (H) 09/08/2021 1225   HCO3 39.3 (H) 09/08/2021 1225   TCO2 25 10/15/2023 0920   O2SAT 99 09/08/2021 1225     Coagulation Profile: No results for input(s): INR, PROTIME in the last 168 hours.  Cardiac Enzymes: No results for input(s): CKTOTAL, CKMB, CKMBINDEX, TROPONINI in the last 168 hours.  HbA1C: Hemoglobin A1C  Date/Time Value Ref Range Status  08/31/2023 03:06 PM 5.6 4.0 - 5.6 % Final  03/16/2023 01:24 PM 5.8 (A) 4.0 - 5.6 % Final   Hgb A1c MFr Bld  Date/Time Value Ref Range Status  06/24/2019 10:21 PM 6.4 (H) 4.8 - 5.6 % Final    Comment:    (NOTE) Pre diabetes:          5.7%-6.4% Diabetes:              >6.4% Glycemic control for   <7.0% adults with diabetes     CBG: No results for input(s): GLUCAP in the last 168 hours.  Review of Systems:   na  Past Medical History:  He,  has a past medical history of Anemia due to blood loss, acute (2014), Anxiety, CKD (chronic kidney disease), stage III (HCC) (2014), Diabetes mellitus without complication (HCC), Gout, High cholesterol, Hypertension (2014), Hypothyroid, Neuropathy, and PUD (peptic ulcer disease) (2014).   Surgical History:   Past Surgical History:  Procedure Laterality Date   ABDOMINAL AORTOGRAM W/LOWER EXTREMITY Bilateral 06/27/2019   Procedure: ABDOMINAL AORTOGRAM W/LOWER EXTREMITY;  Surgeon: Richrd Char, MD;  [facility];  Service: Vascular;  Laterality: Bilateral;   ADRENALECTOMY      AMPUTATION Right 07/18/2019   Procedure: AMPUTATION RIGHT GREAT TOE;  Surgeon: Young Hensen, MD;  [facility] OR;  Service: Vascular;  Laterality: Right;   BIOPSY  09/09/2021   Procedure: BIOPSY;  Surgeon: Annis Kinder, DO;  [facility];  Service: Gastroenterology;;   COLONOSCOPY WITH PROPOFOL  N/A 09/09/2021   Procedure: COLONOSCOPY WITH PROPOFOL ;  Surgeon: Annis Kinder, DO;  [facility];  Service: Gastroenterology;  Laterality: N/A;   COLONOSCOPY WITH PROPOFOL  N/A 09/20/2021   Procedure: COLONOSCOPY WITH PROPOFOL ;  Surgeon: Sergio Dandy, MD;  [facility];  Service: Gastroenterology;  Laterality: N/A;   EMBOLECTOMY Right 06/27/2019   Procedure: RIGHT LEG EMBOLECTOMY;  Surgeon: Richrd Char, MD;  Location: [facility] OR;  Service: Vascular;  Laterality: Right;   ESOPHAGOGASTRODUODENOSCOPY (EGD) WITH PROPOFOL  N/A 09/09/2021   Procedure: ESOPHAGOGASTRODUODENOSCOPY (EGD) WITH PROPOFOL ;  Surgeon: Annis Kinder, DO;  [facility];  Service: Gastroenterology;  Laterality:  N/A;   HEMOSTASIS CLIP PLACEMENT  09/20/2021   Procedure: HEMOSTASIS CLIP PLACEMENT;  Surgeon: Sergio Dandy, MD;  [facility];  Service: Gastroenterology;;   POLYPECTOMY  09/09/2021   Procedure: POLYPECTOMY;  Surgeon: Annis Kinder, DO;  [facility];  Service: Gastroenterology;;  EGD and COLON     Social History:   reports that he has quit smoking. His smoking use included cigarettes. He has a 6 pack-year smoking history. He quit smokeless tobacco use about 56 years ago. He reports current alcohol use of about 14.0 standard drinks of alcohol per week. He reports current drug use. Drug: Marijuana.   Family History:  His family history includes Diabetes Mellitus II in his maternal aunt; Lung cancer in his mother; Pancreatic cancer in his sister.   Allergies Allergies  Allergen Reactions   Morphine  And Codeine Itching     Home  Medications  Prior to Admission medications   Medication Sig Start Date End Date Taking? Authorizing Provider  amLODipine  (NORVASC ) 5 MG tablet Take 1 tablet (5 mg total) by mouth daily. 03/16/23 03/15/24  Carleen Chary, DO  ammonium lactate  (AMLACTIN DAILY) 12 % lotion Apply 1 Application topically as needed. 05/14/23   Velma Ghazi, DPM  aspirin  EC 81 MG EC tablet Take 1 tablet (81 mg total) by mouth daily. 07/01/19   Eveland, Matthew, PA-C  atorvastatin  (LIPITOR ) 80 MG tablet TAKE 1 TABLET BY MOUTH EVERYDAY AT BEDTIME 12/16/22   O'Neal, Cathay Clonts, MD  azithromycin (ZITHROMAX) 250 MG tablet Take by mouth. 04/23/23   [provider]  Cholecalciferol (VITAMIN D3) 1000 units CAPS Take 1 capsule by mouth daily. 08/13/23   Zheng, Michael, DO  collagenase  (SANTYL ) 250 UNIT/GM ointment Apply 1 Application topically daily. 10/02/23   Velma Ghazi, DPM  doxycycline  (VIBRA -TABS) 100 MG tablet Take 1 tablet (100 mg total) by mouth 2 (two) times daily. 10/14/23   Velma Ghazi, DPM  fenofibrate  54 MG tablet TAKE 1 TABLET(54 MG) BY MOUTH DAILY 05/19/23   Jonelle Neri, DO  finasteride  (PROSCAR ) 5 MG tablet TAKE 1 TABLET (5 MG TOTAL) BY MOUTH DAILY. 12/17/22 12/17/23  Jonelle Neri, DO  levothyroxine  (SYNTHROID ) 112 MCG tablet Take 1 tablet (112 mcg total) by mouth at bedtime. 05/19/23   Jonelle Neri, DO  Magnesium  250 MG TABS Take 1 tablet (250 mg total) by mouth daily. Patient not taking: Reported on 09/03/2023 08/04/22   Vita Grip, MD  magnesium  oxide (MAG-OX) 400 MG tablet Take 1 tablet by mouth daily. 11/13/14   [provider]  metoprolol  succinate (TOPROL -XL) 25 MG 24 hr tablet TAKE 1 TABLET (25 MG TOTAL) BY MOUTH DAILY. 08/11/23   Jonelle Neri, DO  pantoprazole  (PROTONIX ) 40 MG tablet TAKE 1 TABLET BY MOUTH EVERY DAY 06/30/23   Jonelle Neri, DO  prednisoLONE acetate (PRED FORTE) 1 % ophthalmic suspension INSTILL 4 DROPS A DAY IN BOTH EYES FOR TWO WEEKS AND THEN 2 DROPS A DAY IN BOTH  EYES FOR FOUR WEEKS 04/24/23   [provider]  pregabalin  (LYRICA ) 75 MG capsule Take 2 capsules (150 mg total) by mouth at bedtime. 05/20/23 11/16/23  Jonelle Neri, DO  sertraline  (ZOLOFT ) 100 MG tablet Take 1 tablet (100 mg total) by mouth daily. 10/28/22   Jonelle Neri, DO  solifenacin  (VESICARE ) 10 MG tablet Take 1 tablet (10 mg total) by mouth daily. 03/05/23   Scarlet Curly, MD  tamsulosin  (FLOMAX ) 0.4 MG CAPS capsule TAKE 1 CAPSULE BY MOUTH EVERY  DAY 08/12/23   Jonelle Neri, DO  traZODone  (DESYREL ) 50 MG tablet Take 1 tablet (50 mg total) by mouth at bedtime. 10/01/23   Zheng, Michael, DO  XARELTO  20 MG TABS tablet TAKE 1 TABLET BY MOUTH EVERY DAY 03/03/23   Jonelle Neri, DO     Critical care time: 45 min      Siegfried Dress Jaxden Blyden ACNP Acute Care Nurse Practitioner [facility] Pulmonary/Critical Care Please consult Amion 10/15/2023, 10:52 AM

## 2023-10-15 NOTE — Progress Notes (Signed)
 LTM EEG hooked up and running - no initial skin breakdown - push button tested - [facility] monitoring.

## 2023-10-15 NOTE — ED Notes (Signed)
 [place] J applied to replace c-collar from EMS.

## 2023-10-15 NOTE — Progress Notes (Signed)
 Orthopedic Tech Progress Note Patient Details:  [name] 07-25-1950 [id number] Level 1 Trauma, not needed Patient ID: [name], male   DOB: Sep 16, 1950, 73 y.o.   MRN: [id number]  [name] 10/15/2023, 9:51 AM

## 2023-10-15 NOTE — Progress Notes (Signed)
 Pt transported on ventilator from traumaB to CT and back to trauma B without issues.

## 2023-10-15 NOTE — ED Provider Notes (Signed)
 [place] EMERGENCY DEPARTMENT AT [facility] Provider Note   CSN: [id number] Arrival date & time: 10/15/23  4098     Patient presents with: No chief complaint on file.   Jonathon [name] is a 73 y.o. male.   Patient is a 73 year old male with a past medical history of A-fib on Eliquis, hypertension, CKD, CHF presenting to the emergency department after a fall.  Per EMS, the patient's wife found him on the ground unresponsive this morning, believe that he fell sometime in the night but is unsure how he felt.  On EMS arrival he was agitated but not speaking or opening his eyes and pupils were unequal.  He was found to be in a wide-complex tachycardia.  He was given Versed  for anxiolysis and route.  The history is provided by the EMS personnel. The history is limited by the condition of the patient.       Prior to Admission medications   Medication Sig Start Date End Date Taking? Authorizing Provider  amLODipine  (NORVASC ) 5 MG tablet Take 1 tablet (5 mg total) by mouth daily. 03/16/23 03/15/24  Carleen Chary, DO  ammonium lactate  (AMLACTIN DAILY) 12 % lotion Apply 1 Application topically as needed. 05/14/23   Velma Ghazi, DPM  aspirin  EC 81 MG EC tablet Take 1 tablet (81 mg total) by mouth daily. 07/01/19   Eveland, Matthew, PA-C  atorvastatin  (LIPITOR ) 80 MG tablet TAKE 1 TABLET BY MOUTH EVERYDAY AT BEDTIME 12/16/22   O'Neal, Cathay Clonts, MD  azithromycin (ZITHROMAX) 250 MG tablet Take by mouth. 04/23/23   [provider]  Cholecalciferol (VITAMIN D3) 1000 units CAPS Take 1 capsule by mouth daily. 08/13/23   Zheng, Michael, DO  collagenase  (SANTYL ) 250 UNIT/GM ointment Apply 1 Application topically daily. 10/02/23   Velma Ghazi, DPM  doxycycline  (VIBRA -TABS) 100 MG tablet Take 1 tablet (100 mg total) by mouth 2 (two) times daily. 10/14/23   Velma Ghazi, DPM  fenofibrate  54 MG tablet TAKE 1 TABLET(54 MG) BY MOUTH DAILY 05/19/23   Jonelle Neri, DO  finasteride   (PROSCAR ) 5 MG tablet TAKE 1 TABLET (5 MG TOTAL) BY MOUTH DAILY. 12/17/22 12/17/23  Jonelle Neri, DO  levothyroxine  (SYNTHROID ) 112 MCG tablet Take 1 tablet (112 mcg total) by mouth at bedtime. 05/19/23   Jonelle Neri, DO  Magnesium  250 MG TABS Take 1 tablet (250 mg total) by mouth daily. Patient not taking: Reported on 09/03/2023 08/04/22   Vita Grip, MD  magnesium  oxide (MAG-OX) 400 MG tablet Take 1 tablet by mouth daily. 11/13/14   [provider]  metoprolol  succinate (TOPROL -XL) 25 MG 24 hr tablet TAKE 1 TABLET (25 MG TOTAL) BY MOUTH DAILY. 08/11/23   Jonelle Neri, DO  pantoprazole  (PROTONIX ) 40 MG tablet TAKE 1 TABLET BY MOUTH EVERY DAY 06/30/23   Jonelle Neri, DO  prednisoLONE acetate (PRED FORTE) 1 % ophthalmic suspension INSTILL 4 DROPS A DAY IN BOTH EYES FOR TWO WEEKS AND THEN 2 DROPS A DAY IN BOTH EYES FOR FOUR WEEKS 04/24/23   [provider]  pregabalin  (LYRICA ) 75 MG capsule Take 2 capsules (150 mg total) by mouth at bedtime. 05/20/23 11/16/23  Jonelle Neri, DO  sertraline  (ZOLOFT ) 100 MG tablet Take 1 tablet (100 mg total) by mouth daily. 10/28/22   Jonelle Neri, DO  solifenacin  (VESICARE ) 10 MG tablet Take 1 tablet (10 mg total) by mouth daily. 03/05/23   Scarlet Curly, MD  tamsulosin  (FLOMAX ) 0.4 MG CAPS capsule TAKE 1 CAPSULE  BY MOUTH EVERY DAY 08/12/23   Jonelle Neri, DO  traZODone  (DESYREL ) 50 MG tablet Take 1 tablet (50 mg total) by mouth at bedtime. 10/01/23   Zheng, Michael, DO  XARELTO  20 MG TABS tablet TAKE 1 TABLET BY MOUTH EVERY DAY 03/03/23   Jonelle Neri, DO    Allergies: Morphine  and codeine    Review of Systems  Updated Vital Signs BP (!) 133/109   Pulse (!) 55   Temp 99 F (37.2 C) (Axillary)   Resp 14   Ht 6' (1.829 m)   Wt 97.4 kg   SpO2 92%   BMI 29.12 kg/m   Physical Exam Vitals and nursing note reviewed.  Constitutional:      General: He is in acute distress.     Comments: Drowsy, intermittently flailing extremities  HENT:     Head:  Normocephalic.     Comments: Contusion to R-side of forehead    Nose: Nose normal.     Mouth/Throat:     Mouth: Mucous membranes are moist.     Pharynx: Oropharynx is clear.   Eyes:     Conjunctiva/sclera: Conjunctivae normal.     Pupils: Pupils are equal, round, and reactive to light.   Neck:     Comments: No step-offs, C-collar in place Cardiovascular:     Rate and Rhythm: Tachycardia present. Rhythm irregular.     Heart sounds: Normal heart sounds.  Pulmonary:     Effort: Pulmonary effort is normal.     Breath sounds: Normal breath sounds.  Abdominal:     General: Abdomen is flat.     Palpations: Abdomen is soft.     Tenderness: There is no abdominal tenderness.   Musculoskeletal:     Comments: No stepoffs on back No obvious deformity or tenderness to extremities   Skin:    General: Skin is warm and dry.     Comments: Contusion to RLQ/R hip Abrasion to R knee and shin   Neurological:     Comments: GCS 7 (E1, V2, M4) Withdrawing in all 4 extremities    (all labs ordered are listed, but only abnormal results are displayed) Labs Reviewed  I-STAT CHEM 8, ED - Abnormal; Notable for the following components:      Result Value   Potassium 3.2 (*)    Creatinine, Ser 1.50 (*)    Glucose, Bld 172 (*)    Calcium , Ion 0.95 (*)    All other components within normal limits  I-STAT CG4 LACTIC ACID, ED - Abnormal; Notable for the following components:   Lactic Acid, Venous 5.5 (*)    All other components within normal limits  CULTURE, BLOOD (ROUTINE X 2)  CULTURE, BLOOD (ROUTINE X 2)  COMPREHENSIVE METABOLIC PANEL WITH GFR  CBC  ETHANOL  PROTIME-INR  CK TOTAL AND CKMB (NOT AT [facility])  RAPID URINE DRUG SCREEN, HOSP PERFORMED  URINALYSIS, W/ REFLEX TO CULTURE (INFECTION SUSPECTED)  CK  I-STAT CG4 LACTIC ACID, ED  SAMPLE TO BLOOD BANK    EKG: EKG Interpretation Date/Time:  Thursday October 15 2023 10:03:47 EDT Ventricular Rate:  165 PR Interval:    QRS  Duration:  136 QT Interval:  336 QTC Calculation: 557 R Axis:   -69  Text Interpretation: Wide-QRS tachycardia Nonspecific IVCD with LAD Now in wide complex tachycardia compared to prior EKG Confirmed by Celesta Coke ([id number]) on 10/15/2023 10:05:58 AM  Radiology: CT CERVICAL SPINE WO CONTRAST Result Date: 10/15/2023 CLINICAL DATA:  Polytrauma, blunt EXAM: CT CERVICAL SPINE  WITHOUT CONTRAST TECHNIQUE: Multidetector CT imaging of the cervical spine was performed without intravenous contrast. Multiplanar CT image reconstructions were also generated. RADIATION DOSE REDUCTION: This exam was performed according to the departmental dose-optimization program which includes automated exposure control, adjustment of the mA and/or kV according to patient size and/or use of iterative reconstruction technique. COMPARISON:  None Available. FINDINGS: Alignment: Anatomic. Skull base and vertebrae: Intact.  No bony lesions present. Soft tissues and spinal canal: The paraspinous soft tissues demonstrate no evidence of hematoma or soft tissue injury. Disc levels: Bulging disc osteophyte complexes are present at C3-4, C4-5, C5-6 and C6-7. There is moderate to severe central spinal canal stenosis at C5-6, slightly worse on the right. There is moderate central spinal canal stenosis at C3-4, C4-5 and C6-7. Upper chest: Unremarkable. Other: Oral tracheal and orogastric tubes are noted. IMPRESSION: 1. Moderate multilevel chronic degenerative disc disease. No evidence of acute traumatic injury. 2. These results were called by telephone at the time of interpretation on 10/15/2023 at 10:02 am to provider Dr. Hildy Lowers, who verbally acknowledged these results. Electronically Signed   By: Maribeth Shivers M.D.   On: 10/15/2023 10:07   CT HEAD WO CONTRAST Result Date: 10/15/2023 CLINICAL DATA:  Head trauma, moderate-severe EXAM: CT HEAD WITHOUT CONTRAST TECHNIQUE: Contiguous axial images were obtained from the base of the skull  through the vertex without intravenous contrast. RADIATION DOSE REDUCTION: This exam was performed according to the departmental dose-optimization program which includes automated exposure control, adjustment of the mA and/or kV according to patient size and/or use of iterative reconstruction technique. COMPARISON:  None Available. FINDINGS: Brain: There is an acute intraparenchymal hemorrhage present within the right lateral temporal lobe, measuring approximately 6.1 cm in AP diameter, 3.5 cm in transverse diameter and 3.2 cm in craniocaudad length. There is surrounding vasogenic edema. There is also adjacent subarachnoid hemorrhage. There is mass effect with shift of the midline structures to the left by approximately 5 mm. Vascular: Mild calcific atheromatous disease.  No hyperdense artery. Skull: Intact and unremarkable. Sinuses/Orbits: Polypoid mucosal lesion within the right maxillary sinus associated with an unerupted molar. Status post bilateral lens replacement. Other: Oral tracheal and orogastric tubes are present. IMPRESSION: 1. Acute intraparenchymal hematoma within the right lateral temporal lobe with surrounding vasogenic edema and mass effect, with shift of the midline structures to the left by approximately 5 mm. 2. Scattered subarachnoid hemorrhage, primarily within the adjacent cerebral sulci and sylvian fissure. These results were called by telephone at the time of interpretation on 10/15/2023 at 9:47 am to provider Dr. Hildy Lowers, who verbally acknowledged these results. Electronically Signed   By: Maribeth Shivers M.D.   On: 10/15/2023 09:59     .Critical Care  Performed by: Kingsley, Teofilo Lupinacci K, DO Authorized by: Nolberto Batty, DO   Critical care provider statement:    Critical care time (minutes):  30   Critical care was time spent personally by me on the following activities:  Development of treatment plan with patient or surrogate, discussions with consultants, evaluation of  patient's response to treatment, examination of patient, ordering and review of laboratory studies, ordering and review of radiographic studies, ordering and performing treatments and interventions, pulse oximetry, re-evaluation of patient's condition and review of old charts Procedure Name: Intubation Date/Time: 10/15/2023 10:04 AM  Performed by: Nolberto Batty, DOPre-anesthesia Checklist: Patient identified, Patient being monitored, Emergency Drugs available, Timeout performed and Suction available Oxygen Delivery Method: Non-rebreather mask Preoxygenation: Pre-oxygenation with 100% oxygen Induction Type: Rapid sequence  Ventilation: Mask ventilation without difficulty Laryngoscope Size: Glidescope and 3 Grade View: Grade I Tube type: Non-subglottic suction tube Tube size: 8.0 mm Number of attempts: 1 Airway Equipment and Method: Video-laryngoscopy and Rigid stylet Placement Confirmation: ETT inserted through vocal cords under direct vision, CO2 detector and Breath sounds checked- equal and bilateral Secured at: 25 cm Tube secured with: ETT holder Dental Injury: Teeth and Oropharynx as per pre-operative assessment        Medications Ordered in the ED  0.9 %  sodium chloride  infusion (1,000 mLs Intravenous New Bag/Given 10/15/23 0910)  etomidate (AMIDATE) injection (20 mg Intravenous Given 10/15/23 0912)  rocuronium  (ZEMURON ) injection (100 mg Intravenous Given 10/15/23 0912)  docusate (COLACE) 50 MG/5ML liquid 100 mg (has no administration in time range)  polyethylene glycol (MIRALAX / GLYCOLAX) packet 17 g (has no administration in time range)  fentaNYL  (SUBLIMAZE ) injection 25-50 mcg (has no administration in time range)  fentaNYL  in NS (50mcg/ml) infusion-PREMIX (50 mcg/hr Intravenous New Bag/Given 10/15/23 0922)  fentaNYL  (SUBLIMAZE ) bolus via infusion 25-100 mcg (has no administration in time range)  propofol  (DIPRIVAN ) 1000 MG/100ML infusion (10 mcg/kg/min   97.4 kg Intravenous New Bag/Given 10/15/23 0917)  amiodarone (NEXTERONE) 1.8 mg/mL load via infusion 150 mg (150 mg Intravenous Bolus from Bag 10/15/23 0910)    Followed by  amiodarone (NEXTERONE PREMIX) 360-4.14 MG/200ML-% (1.8 mg/mL) IV infusion (60 mg/hr Intravenous New Bag/Given 10/15/23 0910)    Followed by  amiodarone (NEXTERONE PREMIX) 360-4.14 MG/200ML-% (1.8 mg/mL) IV infusion (has no administration in time range)  prothrombin  complex conc human (KCENTRA ) IVPB 4,660 Units (has no administration in time range)  iohexol  (OMNIPAQUE ) 350 MG/ML injection 75 mL (75 mLs Intravenous Contrast Given 10/15/23 0940)    Clinical Course as of 10/15/23 1052  Thu Oct 15, 2023  0939 Suspected hemorrhagic stroke on [facility], will plan on reversal of anticoagulation.  [VK]  1000 Dr. Hildy Lowers spoke with neurosurgery who requested CTA to evaluate for possible intervention and consulted neurology. Request CCM consult for admission and trauma will continue to follow in consultation. Trauma updated family with findings thus far. [VK]  1052 I spoke with critical care who will evaluate the patient for admission. No other traumatic injury on CTAP. [VK]    Clinical Course User Index [VK] Kingsley, Pragya Lofaso K, DO                                 Medical Decision Making This patient presents to the ED with chief complaint(s) of AMS, fall with pertinent past medical history of A fib on Xarelto , CHF, HTN, CKD which further complicates the presenting complaint. The complaint involves an extensive differential diagnosis and also carries with it a high risk of complications and morbidity.    The differential diagnosis includes ICH, mass effect, cervical spine fracture, blunt thoracic or abdominal trauma, arrhythmia, anemia, electrolyte abnormality, rhabdo, infection, CVA, TIA  Additional history obtained: Additional history obtained from EMS  Records reviewed Primary Care Documents and podiatry records  ED Course and  Reassessment: Patient was made a prehospital arrival level 2 trauma and I was immediately present on his arrival.  Patient was on nonrebreather and minimally responsive on arrival with initial GCS of 6 and was upgraded to level 1 trauma.  Patient was found to be in a wide-complex tachycardia with initial rate in the 200s with stable blood pressure.  The patient was placed on the pads and  the monitor.  Decision was made for intubation for his low GCS.  Preintubation he was noted to have equal and reactive pupils bilaterally and was withdrawing in all 4 extremities.  At 1 point he did make a moaning sound.  The patient had EKG that showed wide complex tachycardia, does somewhat irregular and suspect likely A-fib with RVR versus V. tach and was bolused with amnio and started on amiodarone drip.  Please see my procedure note for intubation.  Trauma surgery was immediately present within 5 minutes of the level 1 trauma being called.  On his secondary survey he was noted to have a contusion to the right hip, right forehead and an abrasion to the right knee and shin.  The patient was placed on sedation and had CT pan scan performed and labs sent.  He was transferred to CT scanner, currently in critical condition.  Independent labs interpretation:  The following labs were independently interpreted: Lactic acidosis, creatinine approximately at baseline, labs otherwise pending  Independent visualization of imaging: - I independently visualized the following imaging with scope of interpretation limited to determining acute life threatening conditions related to emergency care: Chest x-ray, CT head, which revealed appropriate position of ET tube, no large pneumothorax, large intraparenchymal hemorrhage with subarachnoid hemorrhage midline shift on head CT  Consultation: - Consulted or discussed management/test interpretation w/ external professional: Trauma, neurosurgery, neurology, critical care  Consideration for  admission or further workup: Patient requires admission for his hemorrhagic stroke Social Determinants of health: N/A    Amount and/or Complexity of Data Reviewed Labs: ordered. Radiology: ordered.  Risk OTC drugs. Prescription drug management. Decision regarding hospitalization.       Final diagnoses:  Hemorrhagic stroke [facility])    ED Discharge Orders     None          Kingsley, Cassian Torelli K, DO 10/15/23 1012

## 2023-10-15 NOTE — Progress Notes (Signed)
 RT transported pt on ventilator from CT back to Trauma B without any issues.

## 2023-10-15 NOTE — Care Management (Signed)
 Transition of Care [facility]) - Inpatient Brief Assessment   Patient Details  Name: Jonathon [name] MRN: [id number] Date of Birth: 1951/01/30  Transition of Care [facility]) CM/SW Contact:    Ronni Colace, RN Phone Number: 10/15/2023, 12:03 PM   Clinical Narrative:  The patient presented after  a fall, indeterminate time down intubated, patient was combative, ICH bleed and subdural He has been seeing wound center post op toe amputation  some weeks ago.  He has a history of diabetes and was referred to hematology. Mid May for high lab values  The patient lives with his wife prior to this incident and did have some weakness.   TOC will follow for needs, recommendations, and transitions of care  Transition of Care Asessment: Insurance and Status: Insurance coverage has been reviewed Patient has primary care physician: Yes Home environment has been reviewed: Lives with wife Prior level of function:: Independent Prior/Current Home Services: No current home services Social Drivers of Health Review: SDOH reviewed no interventions necessary Readmission risk has been reviewed: Yes Transition of care needs: transition of care needs identified, TOC will continue to follow

## 2023-10-15 NOTE — ED Notes (Signed)
 Attempted to call report to floor. Unavailable at this time.

## 2023-10-15 NOTE — ED Triage Notes (Signed)
 Pt presents to ED via EMS from home. Fall last night. On xarelto . GCS 5 with EMS. Pt combative and pulling monitoring equipment off. HR 200s with wide complex QRS. R side pupil change per EMS. Found by wife on floor this morning with tremors with urinary incontinence. 5 MG versed  given for combativeness. Pt has been taking doxycycline  for foot infection on r foot. 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 15L NRB in place on arrival to ED> Pt remains combative with GCS of 5.

## 2023-10-15 NOTE — Consult Note (Addendum)
 CTA reviewed with Dr. Andy Bannister. No obvious aneurysm or AVM. Likely underlying coagulopathy and resultant moderate sized hemorrhagic stroke to R temporal lobe. Could consider amyloid angiopathy. At this time there does not appear to be significant edema, mass effect. Pending results of repeat CTH, consider continued close monitoring vs operative intervention.   At this time, recommend: -SBP <160 -Na 145-155 -Neuro checks -Keppra -Check platelets -Short interval Repeat CTH, 2pm.   Jelicia Nantz CAYLIN Darrly Loberg, PA-C  HPI:     Patient is a 73 y.o. male presented [facility] ED via EMS as Level 2 trauma, upgraded to Level 1. His wife reportedly saw him take his nighttime meds at 11pm last night, saw him at 3a and he was partially altered, and then at 5a she made the decision to call 911 as his AMS was worsening. He has a PMH pertinent for afib maintained on Xarelto , DM, CHF, HTN, h/o DVT, PAD, HLD. Upon arrival, patient was combative. Decision was made to intubate. Prior to intubation was MAEs and pupils were equal and reactive, 4mm.     Patient Active Problem List   Diagnosis Date Noted   Screening for colon cancer 08/31/2023   COVID-19 virus infection 12/13/2022   Hypomagnesemia 08/04/2022   Allergic rhinitis 06/01/2022   Erythrocytosis 06/01/2022   Allergic conjunctivitis 01/20/2022   BPH (benign prostatic hyperplasia) 09/13/2021   Diverticulosis of colon without hemorrhage    Multiple polyps of sigmoid colon    Multiple gastric polyps    Generalized weakness 09/06/2021   Diabetic peripheral neuropathy associated with type 2 diabetes mellitus (HCC) 07/05/2020   Gastro-esophageal reflux disease with esophagitis, without bleeding 07/05/2020   Hypogonadism in male 07/05/2020   Health care maintenance 03/27/2020   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HCC) 12/16/2019   Erectile dysfunction 12/16/2019   Congestive heart failure (HCC) 07/15/2019   Atrial fibrillation (HCC) 07/15/2019   PAD (peripheral  artery disease) (HCC) 06/24/2019   DVT, lower extremity, distal, acute, right (HCC) 06/09/2019   Essential hypertension 06/09/2019   Chronic kidney disease, stage 3 unspecified (HCC) 06/09/2019   Type 2 diabetes mellitus with diabetic chronic kidney disease (HCC) 06/09/2019   Thrombocytopenia (HCC) 06/09/2019   Hypothyroidism 06/09/2019   Gout 06/09/2019   Hyperlipidemia 06/09/2019   GAD (generalized anxiety disorder) 06/09/2019   Past Medical History:  Diagnosis Date   Anemia due to blood loss, acute 2014   2nd GIB   Anxiety    CKD (chronic kidney disease), stage III (HCC) 2014   Diabetes mellitus without complication (HCC)    Gout    High cholesterol    Hypertension 2014   Hypothyroid    Neuropathy    PUD (peptic ulcer disease) 2014    Past Surgical History:  Procedure Laterality Date   ABDOMINAL AORTOGRAM W/LOWER EXTREMITY Bilateral 06/27/2019   Procedure: ABDOMINAL AORTOGRAM W/LOWER EXTREMITY;  Surgeon: Richrd Char, MD;  Location: [facility] INVAS[facility];  Service: Vascular;  Laterality: Bilateral;   ADRENALECTOMY     AMPUTATION Right 07/18/2019   Procedure: AMPUTATION RIGHT GREAT TOE;  Surgeon: Young Hensen, MD;  Location: [facility] OR;  Service: Vascular;  Laterality: Right;   BIOPSY  09/09/2021   Procedure: BIOPSY;  Surgeon: Annis Kinder, DO;  Location: [facility] ENDOSCOPY;  Service: Gastroenterology;;   COLONOSCOPY WITH PROPOFOL  N/A 09/09/2021   Procedure: COLONOSCOPY WITH PROPOFOL ;  Surgeon: Annis Kinder, DO;  Location: [facility] ENDOSCOPY;  Service: Gastroenterology;  Laterality: N/A;   COLONOSCOPY WITH PROPOFOL  N/A 09/20/2021   Procedure: COLONOSCOPY WITH  PROPOFOL ;  Surgeon: Sergio Dandy, MD;  Location: [facility] ENDOSCOPY;  Service: Gastroenterology;  Laterality: N/A;   EMBOLECTOMY Right 06/27/2019   Procedure: RIGHT LEG EMBOLECTOMY;  Surgeon: Richrd Char, MD;  Location: [facility] OR;  Service: Vascular;  Laterality: Right;   ESOPHAGOGASTRODUODENOSCOPY (EGD) WITH PROPOFOL   N/A 09/09/2021   Procedure: ESOPHAGOGASTRODUODENOSCOPY (EGD) WITH PROPOFOL ;  Surgeon: Annis Kinder, DO;  Location: [facility] ENDOSCOPY;  Service: Gastroenterology;  Laterality: N/A;   HEMOSTASIS CLIP PLACEMENT  09/20/2021   Procedure: HEMOSTASIS CLIP PLACEMENT;  Surgeon: Sergio Dandy, MD;  Location: [facility] ENDOSCOPY;  Service: Gastroenterology;;   POLYPECTOMY  09/09/2021   Procedure: POLYPECTOMY;  Surgeon: Annis Kinder, DO;  Location: [facility] ENDOSCOPY;  Service: Gastroenterology;;  EGD and COLON    (Not in a hospital admission)  Allergies  Allergen Reactions   Morphine  And Codeine Itching    Social History   Tobacco Use   Smoking status: Former    Current packs/day: 2.00    Average packs/day: 2.0 packs/day for 3.0 years (6.0 ttl pk-yrs)    Types: Cigarettes   Smokeless tobacco: Former    Quit date: 05/08/1967  Substance Use Topics   Alcohol use: Yes    Alcohol/week: 14.0 standard drinks of alcohol    Types: 14 Shots of liquor per week    Comment: daily    Family History  Problem Relation Age of Onset   Diabetes Mellitus II Maternal Aunt    Lung cancer Mother    Pancreatic cancer Sister      Objective:   Patient Vitals for the past 8 hrs:  BP Temp Temp src Pulse Resp SpO2 Height Weight  10/15/23 0944 -- -- -- -- -- -- 6' (1.829 m) 97.4 kg  10/15/23 0917 -- 99 F (37.2 C) Axillary -- -- -- -- --  10/15/23 0914 (!) 133/109 -- -- -- 14 -- -- --  10/15/23 0912 -- -- -- (!) 55 18 92 % -- --  10/15/23 0908 -- -- -- (!) 126 (!) 22 100 % -- --  10/15/23 0905 -- -- -- 64 (!) 26 98 % -- --  10/15/23 0903 132/80 -- -- -- -- -- -- --   No intake/output data recorded. Total I/O In: 500 [I.V.:500] Out: -   CT HEAD WO CONTRAST Result Date: 10/15/2023 CLINICAL DATA:  Head trauma, moderate-severe EXAM: CT HEAD WITHOUT CONTRAST TECHNIQUE: Contiguous axial images were obtained from the base of the skull through the vertex without intravenous contrast. RADIATION DOSE REDUCTION: This  exam was performed according to the departmental dose-optimization program which includes automated exposure control, adjustment of the mA and/or kV according to patient size and/or use of iterative reconstruction technique. COMPARISON:  None Available. FINDINGS: Brain: There is an acute intraparenchymal hemorrhage present within the right lateral temporal lobe, measuring approximately 6.1 cm in AP diameter, 3.5 cm in transverse diameter and 3.2 cm in craniocaudad length. There is surrounding vasogenic edema. There is also adjacent subarachnoid hemorrhage. There is mass effect with shift of the midline structures to the left by approximately 5 mm. Vascular: Mild calcific atheromatous disease.  No hyperdense artery. Skull: Intact and unremarkable. Sinuses/Orbits: Polypoid mucosal lesion within the right maxillary sinus associated with an unerupted molar. Status post bilateral lens replacement. Other: Oral tracheal and orogastric tubes are present. IMPRESSION: 1. Acute intraparenchymal hematoma within the right lateral temporal lobe with surrounding vasogenic edema and mass effect, with shift of the midline structures to the left by approximately 5 mm. 2. Scattered subarachnoid  hemorrhage, primarily within the adjacent cerebral sulci and sylvian fissure. These results were called by telephone at the time of interpretation on 10/15/2023 at 9:47 am to provider Dr. Hildy Lowers, who verbally acknowledged these results. Electronically Signed   By: Maribeth Shivers M.D.   On: 10/15/2023 09:59   Intubated, sedated, paralyzed with Roc/Succ for intubation Was apparently mumbling incoherently prior to intubated Pupils 4mm, minimally reactive Positive cough/gag per nursing Neg corneals Wound to R second toe   Assessment:   Active Problems:   * No active hospital problems. *  This is a 73 yo with a large R temporal lobe hemorrhage with MLS Plan:   -CTA head/neck -Reverse with Kcentra  -SBP <160  Adira Limburg CAYLIN  Ruthann Angulo, PA-C

## 2023-10-15 NOTE — Progress Notes (Signed)
 Waiting on 4N for patient to be transferred.

## 2023-10-16 ENCOUNTER — Encounter (HOSPITAL_COMMUNITY)

## 2023-10-16 ENCOUNTER — Inpatient Hospital Stay (HOSPITAL_COMMUNITY)

## 2023-10-16 DIAGNOSIS — R569 Unspecified convulsions: Secondary | ICD-10-CM

## 2023-10-16 DIAGNOSIS — J9600 Acute respiratory failure, unspecified whether with hypoxia or hypercapnia: Secondary | ICD-10-CM | POA: Diagnosis not present

## 2023-10-16 DIAGNOSIS — I609 Nontraumatic subarachnoid hemorrhage, unspecified: Secondary | ICD-10-CM | POA: Diagnosis not present

## 2023-10-16 DIAGNOSIS — I10 Essential (primary) hypertension: Secondary | ICD-10-CM | POA: Diagnosis not present

## 2023-10-16 DIAGNOSIS — E1122 Type 2 diabetes mellitus with diabetic chronic kidney disease: Secondary | ICD-10-CM

## 2023-10-16 DIAGNOSIS — E119 Type 2 diabetes mellitus without complications: Secondary | ICD-10-CM | POA: Diagnosis not present

## 2023-10-16 DIAGNOSIS — R29720 NIHSS score 20: Secondary | ICD-10-CM | POA: Diagnosis not present

## 2023-10-16 DIAGNOSIS — E878 Other disorders of electrolyte and fluid balance, not elsewhere classified: Secondary | ICD-10-CM

## 2023-10-16 DIAGNOSIS — I6200 Nontraumatic subdural hemorrhage, unspecified: Secondary | ICD-10-CM

## 2023-10-16 DIAGNOSIS — S065XAA Traumatic subdural hemorrhage with loss of consciousness status unknown, initial encounter: Secondary | ICD-10-CM

## 2023-10-16 DIAGNOSIS — I611 Nontraumatic intracerebral hemorrhage in hemisphere, cortical: Secondary | ICD-10-CM | POA: Diagnosis not present

## 2023-10-16 LAB — BLOOD CULTURE ID PANEL (REFLEXED) - BCID2

## 2023-10-16 LAB — BLOOD GAS, VENOUS
Acid-base deficit: 0.4 mmol/L (ref 0.0–2.0)
Bicarbonate: 24.8 mmol/L (ref 20.0–28.0)
O2 Saturation: 91.2 %
Patient temperature: 36.6
pCO2, Ven: 41 mmHg — ABNORMAL LOW (ref 44–60)
pH, Ven: 7.39 (ref 7.25–7.43)
pO2, Ven: 53 mmHg — ABNORMAL HIGH (ref 32–45)

## 2023-10-16 LAB — URINALYSIS, ROUTINE W REFLEX MICROSCOPIC
Bacteria, UA: NONE SEEN
Bilirubin Urine: NEGATIVE
Glucose, UA: NEGATIVE mg/dL
Ketones, ur: NEGATIVE mg/dL
Leukocytes,Ua: NEGATIVE
Nitrite: NEGATIVE
Protein, ur: NEGATIVE mg/dL
Specific Gravity, Urine: 1.039 — ABNORMAL HIGH (ref 1.005–1.030)
pH: 6 (ref 5.0–8.0)

## 2023-10-16 LAB — RAPID URINE DRUG SCREEN, HOSP PERFORMED
Amphetamines: NOT DETECTED
Barbiturates: NOT DETECTED
Benzodiazepines: POSITIVE — AB
Cocaine: NOT DETECTED
Opiates: NOT DETECTED
Tetrahydrocannabinol: POSITIVE — AB

## 2023-10-16 LAB — GLUCOSE, CAPILLARY
Glucose-Capillary: 114 mg/dL — ABNORMAL HIGH (ref 70–99)
Glucose-Capillary: 82 mg/dL (ref 70–99)
Glucose-Capillary: 95 mg/dL (ref 70–99)

## 2023-10-16 LAB — BASIC METABOLIC PANEL WITH GFR
Anion gap: 10 (ref 5–15)
BUN: 11 mg/dL (ref 8–23)
CO2: 22 mmol/L (ref 22–32)
Calcium: 8.8 mg/dL — ABNORMAL LOW (ref 8.9–10.3)
Chloride: 118 mmol/L — ABNORMAL HIGH (ref 98–111)
Creatinine, Ser: 1.35 mg/dL — ABNORMAL HIGH (ref 0.61–1.24)
GFR, Estimated: 55 mL/min — ABNORMAL LOW (ref >60)
Glucose, Bld: 94 mg/dL (ref 70–99)
Potassium: 3.5 mmol/L (ref 3.5–5.1)
Sodium: 150 mmol/L — ABNORMAL HIGH (ref 135–145)

## 2023-10-16 LAB — SODIUM
Sodium: 148 mmol/L — ABNORMAL HIGH (ref 135–145)
Sodium: 151 mmol/L — ABNORMAL HIGH (ref 135–145)
Sodium: 151 mmol/L — ABNORMAL HIGH (ref 135–145)
Sodium: 155 mmol/L — ABNORMAL HIGH (ref 135–145)

## 2023-10-16 LAB — TRIGLYCERIDES: Triglycerides: 141 mg/dL (ref <150)

## 2023-10-16 LAB — PHOSPHORUS: Phosphorus: 1.2 mg/dL — ABNORMAL LOW (ref 2.5–4.6)

## 2023-10-16 LAB — MAGNESIUM: Magnesium: 1.6 mg/dL — ABNORMAL LOW (ref 1.7–2.4)

## 2023-10-16 MED ORDER — HALOPERIDOL LACTATE 5 MG/ML IJ SOLN
5.0000 mg | Freq: Once | INTRAMUSCULAR | Status: AC
  Administered 2023-10-16: 5 mg via INTRAVENOUS

## 2023-10-16 MED ORDER — METOPROLOL SUCCINATE ER 25 MG PO TB24: 25.0000 mg | ORAL_TABLET | Freq: Every evening | ORAL | Status: DC

## 2023-10-16 MED ORDER — PANTOPRAZOLE SODIUM 40 MG PO TBEC
40.0000 mg | DELAYED_RELEASE_TABLET | Freq: Every day | ORAL | Status: DC
  Filled 2023-10-16: qty 1

## 2023-10-16 MED ORDER — POLYETHYLENE GLYCOL 3350 17 G PO PACK: 17.0000 g | PACK | Freq: Every day | ORAL | Status: DC | PRN

## 2023-10-16 MED ORDER — SERTRALINE HCL 50 MG PO TABS
100.0000 mg | ORAL_TABLET | Freq: Every day | ORAL | Status: DC
  Administered 2023-10-18 – 2023-10-28 (×11): 100 mg
  Filled 2023-10-16 (×12): qty 2

## 2023-10-16 MED ORDER — SENNOSIDES-DOCUSATE SODIUM 8.6-50 MG PO TABS
1.0000 | ORAL_TABLET | Freq: Two times a day (BID) | ORAL | Status: DC
  Filled 2023-10-16: qty 1

## 2023-10-16 MED ORDER — HALOPERIDOL LACTATE 5 MG/ML IJ SOLN
2.0000 mg | Freq: Once | INTRAMUSCULAR | Status: AC
  Administered 2023-10-16: 2 mg via INTRAVENOUS
  Filled 2023-10-16: qty 1

## 2023-10-16 MED ORDER — LEVETIRACETAM 500 MG PO TABS
500.0000 mg | ORAL_TABLET | Freq: Two times a day (BID) | ORAL | Status: DC
  Filled 2023-10-16: qty 1

## 2023-10-16 MED ORDER — DEXMEDETOMIDINE HCL IN NACL 400 MCG/100ML IV SOLN
0.0000 ug/kg/h | INTRAVENOUS | Status: DC
  Administered 2023-10-16: 1.2 ug/kg/h via INTRAVENOUS
  Administered 2023-10-16: 0.8 ug/kg/h via INTRAVENOUS
  Administered 2023-10-16: 0.4 ug/kg/h via INTRAVENOUS
  Administered 2023-10-17: 1.2 ug/kg/h via INTRAVENOUS
  Administered 2023-10-17: 1 ug/kg/h via INTRAVENOUS
  Administered 2023-10-17: 1.1 ug/kg/h via INTRAVENOUS
  Administered 2023-10-18 (×2): 0.8 ug/kg/h via INTRAVENOUS
  Administered 2023-10-18: 0.9 ug/kg/h via INTRAVENOUS
  Administered 2023-10-18 – 2023-10-19 (×2): 1.2 ug/kg/h via INTRAVENOUS
  Administered 2023-10-19: 0.5 ug/kg/h via INTRAVENOUS
  Administered 2023-10-20: 0.7 ug/kg/h via INTRAVENOUS
  Administered 2023-10-20: 0.6 ug/kg/h via INTRAVENOUS
  Administered 2023-10-20 – 2023-10-21 (×2): 0.7 ug/kg/h via INTRAVENOUS
  Filled 2023-10-16 (×3): qty 100
  Filled 2023-10-16 (×2): qty 200
  Filled 2023-10-16 (×3): qty 100
  Filled 2023-10-16 (×2): qty 200
  Filled 2023-10-16 (×10): qty 100

## 2023-10-16 MED ORDER — THIAMINE MONONITRATE 100 MG PO TABS
100.0000 mg | ORAL_TABLET | Freq: Every day | ORAL | Status: DC
Start: 2023-10-17 — End: 2023-10-29
  Administered 2023-10-19 – 2023-10-29 (×11): 100 mg
  Filled 2023-10-16 (×12): qty 1

## 2023-10-16 MED ORDER — ORAL CARE MOUTH RINSE: 15.0000 mL | OROMUCOSAL | Status: DC

## 2023-10-16 MED ORDER — MAGNESIUM SULFATE 2 GM/50ML IV SOLN
2.0000 g | Freq: Once | INTRAVENOUS | Status: AC
  Administered 2023-10-16: 2 g via INTRAVENOUS
  Filled 2023-10-16: qty 50

## 2023-10-16 MED ORDER — POLYETHYLENE GLYCOL 3350 17 G PO PACK
17.0000 g | PACK | Freq: Every day | ORAL | Status: DC
  Administered 2023-10-19 – 2023-10-26 (×6): 17 g
  Filled 2023-10-16 (×6): qty 1

## 2023-10-16 MED ORDER — THIAMINE HCL 100 MG/ML IJ SOLN
100.0000 mg | Freq: Every day | INTRAMUSCULAR | Status: DC
  Administered 2023-10-17: 100 mg via INTRAVENOUS
  Filled 2023-10-16: qty 2

## 2023-10-16 MED ORDER — LEVETIRACETAM 500 MG PO TABS: 500.0000 mg | ORAL_TABLET | Freq: Two times a day (BID) | ORAL | Status: DC

## 2023-10-16 MED ORDER — TAMSULOSIN HCL 0.4 MG PO CAPS: 0.4000 mg | ORAL_CAPSULE | Freq: Every evening | ORAL | Status: DC

## 2023-10-16 MED ORDER — FOLIC ACID 1 MG PO TABS
1.0000 mg | ORAL_TABLET | Freq: Every day | ORAL | Status: DC
  Administered 2023-10-19 – 2023-10-29 (×11): 1 mg
  Filled 2023-10-16 (×12): qty 1

## 2023-10-16 MED ORDER — LEVETIRACETAM (KEPPRA) 500 MG/5 ML ADULT IV PUSH
500.0000 mg | Freq: Two times a day (BID) | INTRAVENOUS | Status: DC
  Administered 2023-10-17 – 2023-10-19 (×7): 500 mg via INTRAVENOUS
  Filled 2023-10-16 (×7): qty 5

## 2023-10-16 MED ORDER — LEVOTHYROXINE SODIUM 112 MCG PO TABS: 112.0000 ug | ORAL_TABLET | Freq: Every day | ORAL | Status: DC

## 2023-10-16 MED ORDER — SENNOSIDES-DOCUSATE SODIUM 8.6-50 MG PO TABS
1.0000 | ORAL_TABLET | Freq: Two times a day (BID) | ORAL | Status: DC
  Administered 2023-10-18 – 2023-10-26 (×12): 1
  Filled 2023-10-16 (×13): qty 1

## 2023-10-16 MED ORDER — POLYETHYLENE GLYCOL 3350 17 G PO PACK: 17.0000 g | PACK | Freq: Every day | ORAL | Status: DC

## 2023-10-16 MED ORDER — HEPARIN SODIUM (PORCINE) 5000 UNIT/ML IJ SOLN
5000.0000 [IU] | Freq: Three times a day (TID) | INTRAMUSCULAR | Status: DC
  Administered 2023-10-17 – 2023-10-29 (×37): 5000 [IU] via SUBCUTANEOUS
  Filled 2023-10-16 (×36): qty 1

## 2023-10-16 MED ORDER — PREGABALIN 75 MG PO CAPS
150.0000 mg | ORAL_CAPSULE | Freq: Every day | ORAL | Status: DC
  Administered 2023-10-18 – 2023-10-28 (×11): 150 mg
  Filled 2023-10-16 (×12): qty 2

## 2023-10-16 MED ORDER — HALOPERIDOL LACTATE 5 MG/ML IJ SOLN
5.0000 mg | Freq: Four times a day (QID) | INTRAMUSCULAR | Status: DC | PRN
  Administered 2023-10-16 – 2023-10-19 (×2): 5 mg via INTRAVENOUS
  Filled 2023-10-16 (×3): qty 1

## 2023-10-16 MED ORDER — POTASSIUM PHOSPHATES 15 MMOLE/5ML IV SOLN
30.0000 mmol | Freq: Once | INTRAVENOUS | Status: AC
  Administered 2023-10-16: 30 mmol via INTRAVENOUS
  Filled 2023-10-16: qty 10

## 2023-10-16 MED ORDER — ADULT MULTIVITAMIN W/MINERALS CH
1.0000 | ORAL_TABLET | Freq: Every day | ORAL | Status: DC
  Administered 2023-10-19 – 2023-10-29 (×11): 1
  Filled 2023-10-16 (×13): qty 1

## 2023-10-16 MED ORDER — LORAZEPAM 1 MG PO TABS
1.0000 mg | ORAL_TABLET | ORAL | Status: AC | PRN
  Administered 2023-10-18 – 2023-10-19 (×3): 4 mg
  Filled 2023-10-16 (×3): qty 4

## 2023-10-16 MED ORDER — LEVOTHYROXINE SODIUM 112 MCG PO TABS
112.0000 ug | ORAL_TABLET | Freq: Every day | ORAL | Status: DC
  Administered 2023-10-19 – 2023-10-29 (×11): 112 ug
  Filled 2023-10-16 (×11): qty 1

## 2023-10-16 MED ORDER — FESOTERODINE FUMARATE ER 4 MG PO TB24
4.0000 mg | ORAL_TABLET | Freq: Every day | ORAL | Status: DC
  Filled 2023-10-16 (×4): qty 1

## 2023-10-16 MED ORDER — SERTRALINE HCL 50 MG PO TABS
100.0000 mg | ORAL_TABLET | Freq: Every day | ORAL | Status: DC
  Filled 2023-10-16: qty 2

## 2023-10-16 MED ORDER — LORAZEPAM 2 MG/ML IJ SOLN
1.0000 mg | INTRAMUSCULAR | Status: AC | PRN
  Administered 2023-10-16: 4 mg via INTRAVENOUS
  Administered 2023-10-17: 2 mg via INTRAVENOUS
  Administered 2023-10-17: 4 mg via INTRAVENOUS
  Administered 2023-10-17: 2 mg via INTRAVENOUS
  Administered 2023-10-18 (×2): 4 mg via INTRAVENOUS
  Administered 2023-10-18: 2 mg via INTRAVENOUS
  Administered 2023-10-19 (×2): 4 mg via INTRAVENOUS
  Filled 2023-10-16 (×4): qty 2
  Filled 2023-10-16: qty 1
  Filled 2023-10-16 (×3): qty 2

## 2023-10-16 MED ORDER — PREGABALIN 75 MG PO CAPS
150.0000 mg | ORAL_CAPSULE | Freq: Every day | ORAL | Status: DC
  Filled 2023-10-16: qty 2

## 2023-10-16 NOTE — Progress Notes (Signed)
    Providing Compassionate, Quality Care - Together   NEUROSURGERY PROGRESS NOTE     S: NAEs o/n. Plans for potential extubation trial today.    O: EXAM:  BP (!) 145/88   Pulse 78   Temp (!) 97.4 F (36.3 C) (Oral)   Resp 16   Ht 6' (1.829 m)   Wt 97.4 kg   SpO2 100%   BMI 29.12 kg/m     Intubated PERRL Withdraw to stimuli RUE/RLE Flicker movement to LUE/LLE   ASSESSMENT:  73 y.o. with right posterior temporoparietal ICH     PLAN: -Continue supportive care per ICU team -Appropriate to wean hypertonics on 6/22 pending progress with extubation/neurologic exam -Call w/ questions/concerns.   Easter Golden, [facility]

## 2023-10-16 NOTE — Progress Notes (Addendum)
 STROKE TEAM PROGRESS NOTE    SIGNIFICANT HOSPITAL EVENTS  6/19: level 2 trauma after being found on the ground incontinent and shaking.  HR 200s with wide complex QRS, amio bolus given and drip started.  GCS 6. Intubated on arrival.  CT Head shows an acute intraparenchymal hematoma within the right lateral temporal lobe with surrounding vasogenic edema and 5 mm midline shift and scattered subarachnoid hemorrhages.  PTA xarelto  reversed with [facility] on Admission 22.  INTERIM HISTORY/SUBJECTIVE  Repeat CT head early this a.m. stable. Family at bedside, thorough discussion held.    Okay from neurology standpoint to wean and extubate  Continue hypertonic saline at 75 mL an hour.  Goal sodium 150-155. Blood pressure adequately controlled.  Not requiring blood pressure drip OBJECTIVE  CBC    Component Value Date/Time   WBC 4.7 10/15/2023 0915   RBC 5.06 10/15/2023 0915   HGB 14.3 10/15/2023 1059   HGB 17.4 08/12/2023 1622   HCT 42.0 10/15/2023 1059   HCT 53.7 (H) 08/12/2023 1622   PLT 86 (L) 10/15/2023 1217   PLT 127 (L) 08/12/2023 1622   MCV 91.7 10/15/2023 0915   MCV 93 08/12/2023 1622   MCH 31.0 10/15/2023 0915   MCHC 33.8 10/15/2023 0915   RDW 15.5 10/15/2023 0915   RDW 13.1 08/12/2023 1622   LYMPHSABS 0.9 08/31/2023 1632   LYMPHSABS 0.8 08/04/2022 1459   MONOABS 0.3 08/31/2023 1632   EOSABS 0.0 08/31/2023 1632   EOSABS 0.1 08/04/2022 1459   BASOSABS 0.0 08/31/2023 1632   BASOSABS 0.0 08/04/2022 1459    BMET    Component Value Date/Time   NA 151 (H) 10/16/2023 1227   NA 138 08/31/2023 1632   K 3.5 10/16/2023 0530   CL 118 (H) 10/16/2023 0530   CO2 22 10/16/2023 0530   GLUCOSE 94 10/16/2023 0530   BUN 11 10/16/2023 0530   BUN 15 08/31/2023 1632   CREATININE 1.35 (H) 10/16/2023 0530   CALCIUM  8.8 (L) 10/16/2023 0530   EGFR 51 (L) 08/31/2023 1632   GFRNONAA 55 (L) 10/16/2023 0530    IMAGING past 24 hours Overnight EEG with video Result Date:  10/16/2023 Arleene Lack, MD     10/16/2023  9:36 AM [name] MRN: [id number] Epilepsy Attending: Arleene Lack Referring Physician/Provider: Imogene Mana, NP Duration: 10/15/2023 1248 to 10/16/2023 0930 Patient history: 72yo M with right temporal ICH. EEG to evaluate for seizure Level of alertness:  comatose/ lethargic AEDs during EEG study: Propofol , LEV Technical aspects: This EEG study was done with scalp electrodes positioned according to the 10-20 International system of electrode placement. Electrical activity was reviewed with band pass filter of 1-70Hz , sensitivity of 7 uV/mm, display speed of 48mm/sec with a 60Hz  notched filter applied as appropriate. EEG data were recorded continuously and digitally stored.  Video monitoring was available and reviewed as appropriate. Description: EEG showed continuous generalized 3 to 5 Hz theta-delta slowing admixed with 1-3 seconds of generalized attenuation. Hyperventilation and photic stimulation were not performed.   ABNORMALITY - Continuous slow, generalized IMPRESSION: This study is suggestive of severe diffuse encephalopathy. No seizures or epileptiform discharges were seen throughout the recording. Priyanka Suzanne Erps   CT HEAD POST STROKE FOLLOWUP/TIMED/STAT READ Result Date: 10/16/2023 CLINICAL DATA:  Code stroke. 73 year old male status post fall on blood thinners with multifocal intracranial hemorrhage. EXAM: CT HEAD WITHOUT CONTRAST TECHNIQUE: Contiguous axial images were obtained from the base of the skull through the vertex without intravenous contrast.  RADIATION DOSE REDUCTION: This exam was performed according to the departmental dose-optimization program which includes automated exposure control, adjustment of the mA and/or kV according to patient size and/or use of iterative reconstruction technique. COMPARISON:  Head CT and CTA head and neck yesterday. FINDINGS: Brain: Intra-axial hemorrhage right hemisphere tracking  throughout the right temporal lobe now appears more mixed density. There are hypodense blood products suspected anterior in the temporal lobe best seen on sagittal image 18. But comparing the hyperdense blood products T yesterday at 1430 hours the hematoma encompasses 74 by 37 x 33 mm (AP by transverse by CC), estimated hyperdense volume 45 mL and not significantly changed. Mixed density right side subdural hematoma redemonstrated, mostly hypodense and 3-4 mm in thickness at most levels. Superimposed foci of hyperdense right side SDH, and scattered bilateral SAH (right superior frontal gyrus series 5, image 53) volume not significantly changed. Basilar cisterns remain patent. Mass effect on the right lateral ventricle and leftward midline shift of 6-7 mm stable (series 2, image 16). No ventriculomegaly. No IVH. Stable gray-white differentiation. Vascular: Stable. Skull: No skull fracture identified. Sinuses/Orbits: Intubated. Similar opacification of right tympanic cavity and mastoids, complex right maxillary retention cyst again incidentally noted. Other: Intubated and oral enteric tube visible. Scalp EEG leads now in place. Orbits soft tissues appears stable and negative. IMPRESSION: 1. Multifocal intracranial hemorrhage appears stable since 1430 hours yesterday: - mixed density elongated right hemisphere Intra-axial Hematoma (note hypodense anterior component with fluid fluid level on series 2, image 23). Hyperdense component volume estimated at 45 mL and stable. - right side SDH, mostly low-density and 4 mm thickness at most levels. - scattered bilateral SAH. 2. Stable intracranial mass effect. 6-7 mm leftward midline shift. No IVH or ventricular trapping. Basilar cisterns remain patent. 3. No skull fracture identified. No new intracranial abnormality identified. Electronically Signed   By: Marlise Simpers M.D.   On: 10/16/2023 05:18    Vitals:   10/16/23 1500 10/16/23 1515 10/16/23 1558 10/16/23 1600  BP: 127/75  127/79 123/80 117/89  Pulse: 79 75 85 91  Resp: 16 18 (!) 24 (!) 25  Temp:   98.2 F (36.8 C) 98.2 F (36.8 C)  TempSrc:      SpO2: 95% 95% 100% 100%  Weight:      Height:        PHYSICAL EXAM General: Elderly male, acutely ill CV: paroxysmal Afib on monitor Respiratory: Intubated, mechanically ventilated on full support   NEURO:  Patient is intubated and sedated.  Patient does move all extremities, becoming agitated with sedation turned off.  Patient localizes in upper extremities, with right arm stronger than left arm.  He is able to wiggle his toes to command.  He does not open his eyes to voice or noxious stimuli.  Cranial Nerves:  II: PERRL.  III, IV, VI: Bilateral blink to threat with forced eye opening VII: UTA d/t ETT VIII: hearing seemingly  intact to voice as he follows verbal commands IX & X: Cough and Gag reflexes intact.  Motor:  BUE: Localizes to pain, with right arm seemingly stronger than left. BLE: Spontaneous movement seen, antigravity strength Tone: is normal and bulk is normal Sensation-withdraws in all 4 extremities Coordination: Not tested Gait- deferred  Most Recent NIH: 20     ASSESSMENT/PLAN  Jonathon [name] is a 73 y.o. male with  73 y.o. male with hx of anemia, afib on xarleto, anxiety, CKD stage 3, DM without complication, HLD, HTN, Hypothyroidism, PUD presenting  as a level 2 trauma after being found on the ground incontinent and shaking. He was given 5mg  of versed  by EMS due to combativeness. He is also on doxycycline  for a toe infection. HR 200s with wide complex QRS, amio bolus given and drip started. GCS 6. Intubated on arrival. CT Head shows an acute intraparenchymal hematoma within the right lateral temporal lobe with surrounding vasogenic edema and 5 mm midline shift and scattered subarachnoid hemorrhages. PTA xarelto  reversed with [facility] on Admission 22.  Intraparenchymal Hemorrhage:  right temporoparietal  Etiology: Primary  hemorrhage versus hemorrhagic infarct related to anticoagulation CT Head: Acute IPH within right lateral temporal lobe with surrounding vasogenic edema and mass effect, midline shift of approximately 5 mm to left Scattered subarachnoid hemorrhage Repeat CT head 6/20 early AM: overall stable Right temporoparietal parenchymal hemorrhage with surrounding vasogenic edema and mass effect, not significantly changed.  Midline shift is now 6 mm Scattered subarachnoid hemorrhage, similar to prior. CTA head & neck  Negative for spot sign, intracranial aneurysm, or evidence of vascular malformation. Right sided subdural hematoma (estimated at 4 mm) suspected in addition to intra-axial and subarachnoid blood.  MRI  with and without contrast: PENDING 2D Echo: PENDING LDL 54 HgbA1c 5.6 VTE prophylaxis - SCDs Xarelto  (rivaroxaban ) daily prior to admission, now on No antithrombotic due to ICH Therapy recommendations:  Pending Disposition: Pending  R SDH Scattered SAH Cerebral Edema Mass Effect Repeat CT overall stable, shows stable edema with 1 mm difference in mass effect on read NS consulted, no surgical intervention at this time. 3% hypertonic saline at 75 mL an hour, continue 3% bolus of 250 given 6/19 Goal sodium 150-155. Every 6 hour sodium checks. Na 138-134-136-144-150-148 At risk for seizures, placed on LTM Read negative, no seizure-like activity seen Will discontinue LTM No need for maintenance AEDs at this time   Acute Respiratory Failure ?Aspiration Pneumonia Intubated 6/19 d/t mental status, inability to protect airway OK from neurology to wean and extubate as mental status allows Wean sedation as able Agree with use of Precedex  Atrial fibrillation Home Meds: Xarelto  20 mg daily Continue telemetry monitoring No anticoagulation due to ICH  Hypertension Home meds: Amlodipine  5 mg daily Unstable Labetalol  IV as needed, hydralazine  IV as needed Cleviprex gtt. as  needed Blood Pressure Goal: SBP between 130-150 for 24 hours and then less than 160   Hyperlipidemia Home meds: Lipitor  80 mg LDL 54, goal < 70 Resume when able to take p.o. Continue statin at discharge  Tobacco Abuse Former cigarette smoker  Substance Abuse UDS positive for THC       Ready to quit? N/A TOC consult for cessation placed  Dysphagia Patient has post-stroke dysphagia, SLP consulted    Diet   Diet NPO time specified   Advance diet as tolerated  Other Stroke Risk Factors Obesity, Body mass index is 29.12 kg/m., BMI >/= 30 associated with increased stroke risk, recommend weight loss, diet and exercise as appropriate   Other Active Problems Right foot injury, s/p recent graft and recent infection Was on doxycycline  PVD with neuropathy CKD3b Hypomagnesemia, hypophosphatemia Replenished per [facility] day # 1   Pt seen by Neuro NP/APP with MD. Note/plan to be edited by MD as needed.    Audrene Lease, DNP [facility] Please use AMION for contact information & EPIC for messaging.  I have personally obtained history,examined this patient, reviewed notes, independently viewed imaging studies, participated in medical decision making and plan of care.ROS  completed by me personally and pertinent positives fully documented  I have made any additions or clarifications directly to the above note. Agree with note above.  Patient presented with altered mental status and possible seizures and brain imaging shows large right parietal parenchymal hemorrhage with mild cytotoxic edema.  Follow-up CT scan shows stable appearance of the hemorrhage.  His anticoagulation was reversed with Kcentra .  Continue strict blood pressure control with systolic goal between 130-150 for the first 24 hours and rate below 160.  Continue hypertonic saline drip with serum sodium goal 150-155.  Wean off ventilatory support and wean sedation and extubate as tolerated per critical care  team.  Will consider MRI scan of the brain with and without contrast when stable.  Discontinue long-term EEG monitoring if no evidence of seizures noted.  Patient will have to be off anticoagulation at least for 2 weeks but remains at risk for recurrent strokes.  Will make a decision about Watchman device later depending upon his improvement.  Long discussion with patient's wife and 2 daughters at the bedside and answered questions.  Discussed with Dr. Diania Fortes critical care medicine This patient is critically ill and at significant risk of neurological worsening, death and care requires constant monitoring of vital signs, hemodynamics,respiratory and cardiac monitoring, extensive review of multiple databases, frequent neurological assessment, discussion with family, other specialists and medical decision making of high complexity.I have made any additions or clarifications directly to the above note.This critical care time does not reflect procedure time, or teaching time or supervisory time of PA/NP/Med Resident etc but could involve care discussion time.  I spent 40 minutes of neurocritical care time  in the care of  this patient.     Ardella Beaver, MD Medical Director [facility] Pager: [phone] 10/16/2023 5:40 PM   To contact Stroke Continuity provider, please refer to [email or web address]. After hours, contact General Neurology

## 2023-10-16 NOTE — Progress Notes (Signed)
 PT Cancellation Note  Patient Details Name: Jonathon [name] MRN: [id number] DOB: Aug 06, 1950   Cancelled Treatment:    Reason Eval/Treat Not Completed: Active bedrest order x24 hours starting at 11:02 AM 6/19. Discussed with RN, and pt is still intubated and plans for potential MRI later today. Will likely hold off on therapy today and follow-up another day as able.   Vernida Goodie, PT, DPT Acute Rehabilitation Services  Office: [phone]    Ellyn Hack 10/16/2023, 7:57 AM

## 2023-10-16 NOTE — Progress Notes (Signed)
 Patient taken to and from CT on vent with no complications noted. Ambu bag at bedside for travel. Pt returned and plugged back to red outlet and wall O2 & Air.

## 2023-10-16 NOTE — Procedures (Addendum)
 [name]  MRN: [id number]  Epilepsy Attending: Arleene Lack  Referring Physician/Provider: Imogene Mana, NP  Duration: 10/15/2023 1248 to 10/16/2023 1410  Patient history: 72yo M with right temporal ICH. EEG to evaluate for seizure  Level of alertness:  comatose/ lethargic   AEDs during EEG study: Propofol , LEV  Technical aspects: This EEG study was done with scalp electrodes positioned according to the 10-20 International system of electrode placement. Electrical activity was reviewed with band pass filter of 1-70Hz , sensitivity of 7 uV/mm, display speed of 22mm/sec with a 60Hz  notched filter applied as appropriate. EEG data were recorded continuously and digitally stored.  Video monitoring was available and reviewed as appropriate.  Description: EEG showed continuous generalized 3 to 5 Hz theta-delta slowing admixed with 1-3 seconds of generalized attenuation. Hyperventilation and photic stimulation were not performed.     ABNORMALITY - Continuous slow, generalized  IMPRESSION: This study is suggestive of severe diffuse encephalopathy. No seizures or epileptiform discharges were seen throughout the recording.  Kirt Chew O Ziyan Hillmer

## 2023-10-16 NOTE — Progress Notes (Signed)
 We slowly weaned sedation down Vent mechanics excellent on 5/5 w/ no accessory use and good cuff leak check.   Shortly after the fentanyl  was stopped and dex weaned down to 0.03 pt woke up very agitated. Was tachycardic. Reaching w/ all extremities, was transiently hypertensive and very difficult to calm. Because vent mechanics were excellent and cuff leak present I proceeded w/ extubation myself while at bedside   Post extubation sats maintained > 94% on  Phonation quality was actually fairly strong.  Remained agitated and tachycardic.   We slowly titrated his dex to 0.07 and this enabled him to breath more comfortably and rest.   At present he is resting comfortably  We are working on finding the lowest but most effective dose of dex that will allow him to be comfortable and communicate but hopefully control his agitation   This could end up creating a challenge for MRI in near future

## 2023-10-16 NOTE — Progress Notes (Signed)
 vLTM discontinued  No skin breakdown at disconnect  [facility] notified

## 2023-10-16 NOTE — Progress Notes (Addendum)
 eLink Physician-Brief Progress Note [name] DOB: [birth year] MRN: [id number]   Date of Service  10/16/2023  HPI/Events of Note  Camera high alert: Very agitated, last 5 mg haldol was from 5:20 PM. On precedex 1.2  Discussed with RN.  S/p extubation status from earlier in day. VS stable.   eICU Interventions  Haldol 2 mg IV once. Watch for arhythmia, seizures and look out for any qtc prolongations. Fall and aspiration precautions. Get VBG for any co2 retension.      Intervention Category Intermediate Interventions: Other:  [name] 10/16/2023, 8:43 PM  21:30 Haldol given and working as we speak howver bedside RN has spoke to patients wife and she told RN that husband drinks every night. RN asking about initiating CIWA protocol for patient since it appears ehe may be withdrawling. .  DNR. SAH/ICB. CKD 3. DM, HTN.  - VBG 7.39/pco2 at 41.  - get EKG for any qtc prolongation, hx of a fib- HR 99.  - start CIWA protocol.  22:43 Bedside RN performed EKG, which is in chart now. QTC = 392 - is ok.   Also, they attempted NGT on patient multiple times and it continued to coil in the mouth and they got it on the 5th attempt however cannot see the tube on the KUB.  CxR showing NG coiled up in throat. -  remove NG tube.   RN is asking to change Keppra to IV route so that patient doesn't miss medication.  -ordered

## 2023-10-16 NOTE — Progress Notes (Signed)
 NAME:  Jonathon [name], MRN:  [id number], DOB:  05/24/1950, LOS: 1 ADMISSION DATE:  10/15/2023, CONSULTATION DATE: 10/15/2023 REFERRING MD: Emergency department physician, CHIEF COMPLAINT: Intracerebral hemorrhage  History of Present Illness:  73 year old male who was found down by wife poorly responsive required intubation and transportation to the emergency.  CT scan revealed a right intraparenchymal hemorrhage with other areas of injury.  He is currently intubated in atrial fibrillation ventricular response on amiodarone drip being treated with fentanyl  and propofol  for tube tolerance.  He has been seen by neurosurgery and neurology.  Hyperatremia treatment we await the final results of CT scan of the head to see if there is any surgical options.  Pertinent  Medical History   Past Medical History:  Diagnosis Date   Anemia due to blood loss, acute 2014   2nd GIB   Anxiety    CKD (chronic kidney disease), stage III (HCC) 2014   Diabetes mellitus without complication (HCC)    Gout    High cholesterol    Hypertension 2014   Hypothyroid    Neuropathy    PUD (peptic ulcer disease) 2014     Significant Hospital Events: Including procedures, antibiotic start and stop dates in addition to other pertinent events   6/19 admitted w/ ICH felt 2/2 HTN and c/b SDH p/ fall. Started AEDs. HTNS. Seen by n surg felt unlikely that surg would improve outcome. Seen by neuro. Placed on clevi. Got AC reversed in ER. Unasyn started for asp 6/20 weaning CT imaging stable ECHO ordered. 1/2 BC + felt contamination but does bring endocarditis into question   Interim History / Subjective:  sedated  Objective    Blood pressure 118/79, pulse 75, temperature (!) 97 F (36.1 C), temperature source Axillary, resp. rate 14, height 6' (1.829 m), weight 97.4 kg, SpO2 98%.    Vent Mode: CPAP;PSV FiO2 (%):  [40 %-50 %] 40 % Set Rate:  [16 bmp] 16 bmp Vt Set:  [620 mL] 620 mL PEEP:  [5 cmH20] 5 cmH20 Pressure  Support:  [8 cmH20] 8 cmH20 Plateau Pressure:  [15 cmH20-17 cmH20] 15 cmH20   Intake/Output Summary (Last 24 hours) at 10/16/2023 1209 Last data filed at 10/16/2023 1200 Gross per 24 hour  Intake 2968.67 ml  Output 1770 ml  Net 1198.67 ml   Filed Weights   10/15/23 0944  Weight: 97.4 kg    Examination:  General currently sedated HENT NCAT no JVD Pulm clear. Dec bases. On PSV of 8 peep 5 VTs 400s. Still too sedated to extubate Card irreg irreg Abd soft Ext warm right to amputation and graft site GU cl yellow  Neuro sedated. W/d to pain uppers. Flickers BLE  Resolved problem list   Assessment and Plan  right intraparenchymal hemorrhage involving right temporal lobe w/  vasogenic edema, (R) SDH AND also adjacent scattered subarachnoid hemorrhages primarily within the adjacent cerebral area.  -felt primary injury d/t HTN on DOAC - DOAC reversed in ER -Stable imaging on 6/19.  -LTM neg for seizures to date Plan BP goal <160  Goal Na 145-155 (HTNS protocol) AEDs per neuro Serial neuro checks LTM per neuro  MRI brain for 6/21  Getting ECHO (1/2 BC w/ staph) although suspect this is contaminant, if does have endocarditis septic emboli also on ddx  Neuro protective support: HOB elevated, glycemic control, avoid fevers Holding any form of ac until 6/21  Ventilator management s/p ICH and ineffective airway clearance c/b aspiration PNA Mental status primary barrier Plan  Day 2 Unasyn Changed sedation to dex SBT and assess for extubation when MS supports PAD goal -1  Possible bacteremia  1/2 BC + staph organism (not SA) Likely contamination Plan Monitor  Get ECHO No change in abx   Poorly controlled hypertension Plan Sbp < 160  PRN hydralazine  and labetolol   Diabetes mellitus with poor control Plan Ssi  Goal 140-180   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 (Moderately to severely decreased)  Scr at baseline Plan Avoid hypotension Renal dose meds as  appropriate Strict I&O  Fluid and electrolyte imbalance: hypomagnesemia and hypophosphatemia  Plan Replace as indicated  Peripheral vascular disease with neuropathy and loss of right great toe fourth toe as well as debridement and recent skin graft of 2nd toe on right foot plan monitor  Best Practice (right click and Reselect all SmartList Selections daily)   Diet/type: NPO DVT prophylaxis  Pressure ulcer(s): present on admission  GI prophylaxis: PPI Lines: N/A Foley:  N/A Code Status:  full code Last date of multidisciplinary goals of care discussion [tbd]  Critical care time: 34 min

## 2023-10-16 NOTE — Progress Notes (Signed)
 Intermittently agitated.  Back up on the dex Gave additional haldol w/ good effect,  I don't know what his ETOH history is will need to ask family about this to ensure we have everything covered

## 2023-10-16 NOTE — Progress Notes (Signed)
 OT Cancellation Note  Patient Details Name: Jonathon [name] MRN: [id number] DOB: 1950/08/06   Cancelled Treatment:    Reason Eval/Treat Not Completed: Active bedrest order;Patient not medically ready (Intubated; sedated.)  Rick Carruthers,HILLARY 10/16/2023, 6:57 AM Milburn Aliment, OT/L   Acute OT Clinical Specialist Acute Rehabilitation Services Pager [phone] Office [phone]

## 2023-10-16 NOTE — Progress Notes (Signed)
 PHARMACY - PHYSICIAN COMMUNICATION CRITICAL VALUE ALERT - BLOOD CULTURE IDENTIFICATION (BCID)  Jonathon [name] is an 73 y.o. male who presented to [facility] on 10/15/2023 as a Fall on thinners w/ R. Lobe hemorrhage and SAH.  Assessment:  1/3 blood cultures with staph spp.   Name of physician (or Provider) Contacted: Dr. Rito Chess  Current antibiotics: Unasyn 3g IV every 6 hours (Asp PNA)  Changes to prescribed antibiotics recommended:   -Continue current regimen, staph spp in 1/3 cultures contaminate  Results for orders placed or performed during the hospital encounter of 10/15/23  Blood Culture ID Panel (Reflexed) (Collected: 10/15/2023  9:04 AM)  Result Value Ref Range   Enterococcus faecalis NOT DETECTED NOT DETECTED   Enterococcus Faecium NOT DETECTED NOT DETECTED   Listeria monocytogenes NOT DETECTED NOT DETECTED   Staphylococcus species DETECTED (A) NOT DETECTED   Staphylococcus aureus (BCID) NOT DETECTED NOT DETECTED   Staphylococcus epidermidis NOT DETECTED NOT DETECTED   Staphylococcus lugdunensis NOT DETECTED NOT DETECTED   Streptococcus species NOT DETECTED NOT DETECTED   Streptococcus agalactiae NOT DETECTED NOT DETECTED   Streptococcus pneumoniae NOT DETECTED NOT DETECTED   Streptococcus pyogenes NOT DETECTED NOT DETECTED   A.calcoaceticus-baumannii NOT DETECTED NOT DETECTED   Bacteroides fragilis NOT DETECTED NOT DETECTED   Enterobacterales NOT DETECTED NOT DETECTED   Enterobacter cloacae complex NOT DETECTED NOT DETECTED   Escherichia coli NOT DETECTED NOT DETECTED   Klebsiella aerogenes NOT DETECTED NOT DETECTED   Klebsiella oxytoca NOT DETECTED NOT DETECTED   Klebsiella pneumoniae NOT DETECTED NOT DETECTED   Proteus species NOT DETECTED NOT DETECTED   Salmonella species NOT DETECTED NOT DETECTED   Serratia marcescens NOT DETECTED NOT DETECTED   Haemophilus influenzae NOT DETECTED NOT DETECTED   Neisseria meningitidis NOT DETECTED NOT DETECTED   Pseudomonas  aeruginosa NOT DETECTED NOT DETECTED   Stenotrophomonas maltophilia NOT DETECTED NOT DETECTED   Candida albicans NOT DETECTED NOT DETECTED   Candida auris NOT DETECTED NOT DETECTED   Candida glabrata NOT DETECTED NOT DETECTED   Candida krusei NOT DETECTED NOT DETECTED   Candida parapsilosis NOT DETECTED NOT DETECTED   Candida tropicalis NOT DETECTED NOT DETECTED   Cryptococcus neoformans/gattii NOT DETECTED NOT DETECTED    Young Hensen, PharmD, BCPS Clinical Pharmacist 10/16/2023 3:47 AM

## 2023-10-17 ENCOUNTER — Inpatient Hospital Stay (HOSPITAL_COMMUNITY)

## 2023-10-17 DIAGNOSIS — F10930 Alcohol use, unspecified with withdrawal, uncomplicated: Secondary | ICD-10-CM | POA: Diagnosis not present

## 2023-10-17 DIAGNOSIS — F1210 Cannabis abuse, uncomplicated: Secondary | ICD-10-CM

## 2023-10-17 DIAGNOSIS — I4891 Unspecified atrial fibrillation: Secondary | ICD-10-CM | POA: Diagnosis not present

## 2023-10-17 DIAGNOSIS — I63512 Cerebral infarction due to unspecified occlusion or stenosis of left middle cerebral artery: Secondary | ICD-10-CM | POA: Diagnosis not present

## 2023-10-17 DIAGNOSIS — I611 Nontraumatic intracerebral hemorrhage in hemisphere, cortical: Secondary | ICD-10-CM | POA: Diagnosis not present

## 2023-10-17 DIAGNOSIS — I609 Nontraumatic subarachnoid hemorrhage, unspecified: Secondary | ICD-10-CM

## 2023-10-17 DIAGNOSIS — I69391 Dysphagia following cerebral infarction: Secondary | ICD-10-CM

## 2023-10-17 DIAGNOSIS — R29721 NIHSS score 21: Secondary | ICD-10-CM

## 2023-10-17 DIAGNOSIS — G9341 Metabolic encephalopathy: Secondary | ICD-10-CM

## 2023-10-17 DIAGNOSIS — G936 Cerebral edema: Secondary | ICD-10-CM | POA: Diagnosis not present

## 2023-10-17 DIAGNOSIS — N1832 Chronic kidney disease, stage 3b: Secondary | ICD-10-CM

## 2023-10-17 DIAGNOSIS — J9601 Acute respiratory failure with hypoxia: Secondary | ICD-10-CM | POA: Diagnosis not present

## 2023-10-17 DIAGNOSIS — I38 Endocarditis, valve unspecified: Secondary | ICD-10-CM

## 2023-10-17 DIAGNOSIS — Z7901 Long term (current) use of anticoagulants: Secondary | ICD-10-CM

## 2023-10-17 DIAGNOSIS — F10139 Alcohol abuse with withdrawal, unspecified: Secondary | ICD-10-CM

## 2023-10-17 DIAGNOSIS — K090 Developmental odontogenic cysts: Secondary | ICD-10-CM | POA: Diagnosis not present

## 2023-10-17 DIAGNOSIS — S06343A Traumatic hemorrhage of right cerebrum with loss of consciousness of 1 hours to 5 hours 59 minutes, initial encounter: Secondary | ICD-10-CM | POA: Diagnosis not present

## 2023-10-17 DIAGNOSIS — I129 Hypertensive chronic kidney disease with stage 1 through stage 4 chronic kidney disease, or unspecified chronic kidney disease: Secondary | ICD-10-CM

## 2023-10-17 LAB — GLUCOSE, CAPILLARY
Glucose-Capillary: 119 mg/dL — ABNORMAL HIGH (ref 70–99)
Glucose-Capillary: 147 mg/dL — ABNORMAL HIGH (ref 70–99)
Glucose-Capillary: 118 mg/dL — ABNORMAL HIGH (ref 70–99)
Glucose-Capillary: 106 mg/dL — ABNORMAL HIGH (ref 70–99)
Glucose-Capillary: 101 mg/dL — ABNORMAL HIGH (ref 70–99)
Glucose-Capillary: 119 mg/dL — ABNORMAL HIGH (ref 70–99)
Glucose-Capillary: 101 mg/dL — ABNORMAL HIGH (ref 70–99)
Glucose-Capillary: 101 mg/dL — ABNORMAL HIGH (ref 70–99)
Glucose-Capillary: 105 mg/dL — ABNORMAL HIGH (ref 70–99)

## 2023-10-17 LAB — BASIC METABOLIC PANEL WITH GFR
Anion gap: 14 (ref 5–15)
BUN: 9 mg/dL (ref 8–23)
CO2: 22 mmol/L (ref 22–32)
Calcium: 8.6 mg/dL — ABNORMAL LOW (ref 8.9–10.3)
Chloride: 121 mmol/L — ABNORMAL HIGH (ref 98–111)
Creatinine, Ser: 1.34 mg/dL — ABNORMAL HIGH (ref 0.61–1.24)
GFR, Estimated: 56 mL/min — ABNORMAL LOW (ref >60)
Glucose, Bld: 113 mg/dL — ABNORMAL HIGH (ref 70–99)
Potassium: 3.4 mmol/L — ABNORMAL LOW (ref 3.5–5.1)
Sodium: 157 mmol/L — ABNORMAL HIGH (ref 135–145)

## 2023-10-17 LAB — CBC
HCT: 46.2 % (ref 39.0–52.0)
Hemoglobin: 14.7 g/dL (ref 13.0–17.0)
MCH: 30.2 pg (ref 26.0–34.0)
MCHC: 31.8 g/dL (ref 30.0–36.0)
MCV: 95.1 fL (ref 80.0–100.0)
Platelets: 67 10*3/uL — ABNORMAL LOW (ref 150–400)
RBC: 4.86 MIL/uL (ref 4.22–5.81)
RDW: 16.8 % — ABNORMAL HIGH (ref 11.5–15.5)
WBC: 4.6 10*3/uL (ref 4.0–10.5)
nRBC: 0 % (ref 0.0–0.2)

## 2023-10-17 LAB — URINALYSIS, ROUTINE W REFLEX MICROSCOPIC
Bilirubin Urine: NEGATIVE
Glucose, UA: 100 mg/dL — AB
Ketones, ur: NEGATIVE mg/dL
Leukocytes,Ua: NEGATIVE
Nitrite: NEGATIVE
Protein, ur: NEGATIVE mg/dL
Specific Gravity, Urine: 1.015 (ref 1.005–1.030)
pH: 7 (ref 5.0–8.0)

## 2023-10-17 LAB — ECHOCARDIOGRAM LIMITED
Calc EF: 51.8 %
Height: 72 in
Single Plane A2C EF: 54.4 %
Single Plane A4C EF: 47.9 %
Weight: 3477.98 [oz_av]

## 2023-10-17 LAB — URINALYSIS, MICROSCOPIC (REFLEX)

## 2023-10-17 LAB — SODIUM, URINE, RANDOM: Sodium, Ur: 216 mmol/L

## 2023-10-17 LAB — SODIUM
Sodium: 156 mmol/L — ABNORMAL HIGH (ref 135–145)
Sodium: 154 mmol/L — ABNORMAL HIGH (ref 135–145)
Sodium: 156 mmol/L — ABNORMAL HIGH (ref 135–145)

## 2023-10-17 LAB — MAGNESIUM: Magnesium: 1.8 mg/dL (ref 1.7–2.4)

## 2023-10-17 LAB — PHOSPHORUS: Phosphorus: 4.2 mg/dL (ref 2.5–4.6)

## 2023-10-17 LAB — OSMOLALITY, URINE: Osmolality, Ur: 482 mosm/kg (ref 300–900)

## 2023-10-17 MED ORDER — PHENOBARBITAL SODIUM 130 MG/ML IJ SOLN: 97.5000 mg | Freq: Three times a day (TID) | INTRAMUSCULAR | Status: DC

## 2023-10-17 MED ORDER — SODIUM CHLORIDE 3 % IV SOLN: INTRAVENOUS | Status: DC

## 2023-10-17 MED ORDER — SODIUM CHLORIDE 0.9 % IV SOLN: INTRAVENOUS | Status: AC

## 2023-10-17 MED ORDER — PHENOBARBITAL SODIUM 65 MG/ML IJ SOLN: 65.0000 mg | Freq: Three times a day (TID) | INTRAMUSCULAR | Status: DC

## 2023-10-17 MED ORDER — PHENOBARBITAL SODIUM 65 MG/ML IJ SOLN
32.5000 mg | Freq: Three times a day (TID) | INTRAMUSCULAR | Status: DC
  Administered 2023-10-19 – 2023-10-20 (×3): 32.5 mg via INTRAVENOUS
  Filled 2023-10-17 (×3): qty 1

## 2023-10-17 MED ORDER — POTASSIUM CHLORIDE 10 MEQ/100ML IV SOLN
10.0000 meq | INTRAVENOUS | Status: AC
  Administered 2023-10-17 (×4): 10 meq via INTRAVENOUS
  Filled 2023-10-17 (×4): qty 100

## 2023-10-17 MED ORDER — GADOBUTROL 1 MMOL/ML IV SOLN
10.0000 mL | Freq: Once | INTRAVENOUS | Status: AC | PRN
Start: 2023-10-17 — End: 2023-10-17
  Administered 2023-10-17: 10 mL via INTRAVENOUS

## 2023-10-17 MED ORDER — PHENOBARBITAL SODIUM 65 MG/ML IJ SOLN
65.0000 mg | Freq: Three times a day (TID) | INTRAMUSCULAR | Status: AC
  Administered 2023-10-17 – 2023-10-19 (×6): 65 mg via INTRAVENOUS
  Filled 2023-10-17 (×6): qty 1

## 2023-10-17 MED ORDER — PHENOBARBITAL SODIUM 65 MG/ML IJ SOLN: 32.5000 mg | Freq: Three times a day (TID) | INTRAMUSCULAR | Status: DC

## 2023-10-17 NOTE — Progress Notes (Signed)
 No new issues or problems.  Patient remains restless and confused.  Follow-up MRI scan reviewed.  MRI scan demonstrates stable appearance of his right temporoparietal hemorrhage without evidence of worsening mass effect.  No evidence of neoplastic or vascular disease.  Status post lobar hypertensive hemorrhage.  No evidence of significant mass effect or need for surgical intervention.  Continue supportive care.

## 2023-10-17 NOTE — Progress Notes (Signed)
 Pharmacy Electrolyte Replacement  Recent Labs:  Recent Labs    10/17/23 0121 10/17/23 0848  K  --  3.4*  MG 1.8  --   PHOS 4.2  --   CREATININE  --  1.34*    Low Critical Values (K </= 2.5, Phos </= 1, Mg </= 1) Present: None  MD Contacted: n/a  Plan: 10 mEq IV KCL x 4 runs (40 mEq total)   Rankin Sams, PharmD, BCPS, BCCCP Clinical Pharmacist

## 2023-10-17 NOTE — Progress Notes (Signed)
*  PRELIMINARY RESULTS* Echocardiogram 2D Echocardiogram has been performed.  Mary-Anne Polizzi D Ilya Ess 10/17/2023, 9:45 AM

## 2023-10-17 NOTE — Progress Notes (Addendum)
 STROKE TEAM PROGRESS NOTE    SIGNIFICANT HOSPITAL EVENTS  6/19: level 2 trauma after being found on the ground incontinent and shaking.  HR 200s with wide complex QRS, amio bolus given and drip started.  GCS 6. Intubated on arrival.  CT Head shows an acute intraparenchymal hematoma within the right lateral temporal lobe with surrounding vasogenic edema and 5 mm midline shift and scattered subarachnoid hemorrhages.  PTA xarelto  reversed with K [facility] on Admission 22. 6/20: Repeat CT head early this a.m. stable. Extubated in afternoon. 6/21: MRI stable IPH, multiple acute/subacute cortical infarcts seen  INTERIM HISTORY/SUBJECTIVE  Family at bedside, thorough discussion held.    Na this AM 156--stop 3%  Patient restless and agitated after extubation yesterday, continued intermittently today.  He is now on CIWA protocol, which has been helping. Per family, patient has around 2 drinks per day.  Blood pressure adequately controlled.  Not requiring blood pressure drip Increased urine output this AM, will send urine Na and Osmo.  ADDENDUM:  Family updated on MRI results over phone @ 1535   OBJECTIVE  CBC    Component Value Date/Time   WBC 4.7 10/15/2023 0915   RBC 5.06 10/15/2023 0915   HGB 14.3 10/15/2023 1059   HGB 17.4 08/12/2023 1622   HCT 42.0 10/15/2023 1059   HCT 53.7 (H) 08/12/2023 1622   PLT 86 (L) 10/15/2023 1217   PLT 127 (L) 08/12/2023 1622   MCV 91.7 10/15/2023 0915   MCV 93 08/12/2023 1622   MCH 31.0 10/15/2023 0915   MCHC 33.8 10/15/2023 0915   RDW 15.5 10/15/2023 0915   RDW 13.1 08/12/2023 1622   LYMPHSABS 0.9 08/31/2023 1632   LYMPHSABS 0.8 08/04/2022 1459   MONOABS 0.3 08/31/2023 1632   EOSABS 0.0 08/31/2023 1632   EOSABS 0.1 08/04/2022 1459   BASOSABS 0.0 08/31/2023 1632   BASOSABS 0.0 08/04/2022 1459    BMET    Component Value Date/Time   NA 156 (H) 10/17/2023 0121   NA 138 08/31/2023 1632   K 3.5 10/16/2023 0530   CL 118 (H) 10/16/2023  0530   CO2 22 10/16/2023 0530   GLUCOSE 94 10/16/2023 0530   BUN 11 10/16/2023 0530   BUN 15 08/31/2023 1632   CREATININE 1.35 (H) 10/16/2023 0530   CALCIUM  8.8 (L) 10/16/2023 0530   EGFR 51 (L) 08/31/2023 1632   GFRNONAA 55 (L) 10/16/2023 0530    IMAGING past 24 hours DG CHEST PORT 1 VIEW Result Date: 10/16/2023 CLINICAL DATA:  NG tube placement EXAM: PORTABLE CHEST 1 VIEW COMPARISON:  10/15/2023 FINDINGS: Removal of endotracheal tube. Enteric tube tip over the upper esophagus at the level of the aortic arch with tubing coiled at the level of the upper neck. Mild cardiomegaly with central vascular congestion. No consolidation, pleural effusion or pneumothorax IMPRESSION: 1. Enteric tube tip over the upper esophagus at the level of the aortic arch with tubing coiled at the level of the upper neck. Recommend repositioning. 2. Mild cardiomegaly with central vascular congestion. These results will be called to the ordering clinician or representative by the Radiologist Assistant, and communication documented in the PACS or [employer]. Electronically Signed   By: Luke Bun M.D.   On: 10/16/2023 22:43   DG Abd 1 View Result Date: 10/16/2023 CLINICAL DATA:  NG tube placement EXAM: ABDOMEN - 1 VIEW COMPARISON:  10/15/2023 FINDINGS: No enteric tube identified on the included portions of the abdomen or pelvis. Mild air distension of the stomach. Nonobstructed  gas pattern IMPRESSION: No enteric tube identified on the included portions of the abdomen or pelvis. Nonobstructed gas pattern Electronically Signed   By: Luke Bun M.D.   On: 10/16/2023 22:41   Overnight EEG with video Result Date: 10/16/2023 Shelton Arlin KIDD, MD     10/16/2023  9:56 PM [name] MRN: [id number] Epilepsy Attending: Arlin KIDD Shelton Referring Physician/Provider: Remi Pippin, NP Duration: 10/15/2023 1248 to 10/16/2023 1410 Patient history: 72yo M with right temporal ICH. EEG to evaluate for seizure  Level of alertness:  comatose/ lethargic AEDs during EEG study: Propofol , LEV Technical aspects: This EEG study was done with scalp electrodes positioned according to the 10-20 International system of electrode placement. Electrical activity was reviewed with band pass filter of 1-70Hz , sensitivity of 7 uV/mm, display speed of 57mm/sec with a 60Hz  notched filter applied as appropriate. EEG data were recorded continuously and digitally stored.  Video monitoring was available and reviewed as appropriate. Description: EEG showed continuous generalized 3 to 5 Hz theta-delta slowing admixed with 1-3 seconds of generalized attenuation. Hyperventilation and photic stimulation were not performed.   ABNORMALITY - Continuous slow, generalized IMPRESSION: This study is suggestive of severe diffuse encephalopathy. No seizures or epileptiform discharges were seen throughout the recording. Priyanka KIDD Shelton    Vitals:   10/17/23 0500 10/17/23 0600 10/17/23 0700 10/17/23 0800  BP: (!) 154/102 (!) 143/91 (!) 152/91 (!) 154/100  Pulse: 95 91 97 90  Resp: (!) 23 (!) 21 (!) 22 (!) 22  Temp: 98.2 F (36.8 C) 97.9 F (36.6 C) 97.9 F (36.6 C) 97.7 F (36.5 C)  TempSrc:      SpO2: 99% 98% 98% 98%  Weight: 98.6 kg     Height:        PHYSICAL EXAM General: Elderly male, acutely ill CV: paroxysmal Afib on monitor Respiratory: Intubated, mechanically ventilated on full support   NEURO:   Temp:  [97 F (36.1 C)-99.7 F (37.6 C)] 97.7 F (36.5 C) (06/21 0800) Pulse Rate:  [70-112] 90 (06/21 0800) Resp:  [13-26] 22 (06/21 0800) BP: (106-167)/(68-105) 154/100 (06/21 0800) SpO2:  [95 %-100 %] 98 % (06/21 0800) FiO2 (%):  [40 %] 40 % (06/20 1100) Weight:  [98.6 kg] 98.6 kg (06/21 0500)  Neuro: Mental Status: Patient had received 4mg  Ativan  within 12 hours of our exam.  Obtunded, does not open eyes, does not follow commands.  Cranial Nerves:  II: PERRL.  III, IV, VI: Bilateral blink to threat with forced  eye opening. Does not track.  VII: Flat left nasolabial fold IX & X: Cough and Gag reflexes decreased Motor:  BUE: Localizes to pain, with right arm seemingly stronger than left. BLE: Spontaneous movement seen, antigravity strength Per RN, when patient Is restless he is able to move all 4 extremities with resistance strength, strong core. Tone: is normal and bulk is normal Sensation-withdraws in all 4 extremities Coordination: Not tested Gait- deferred  Most Recent NIH: 21     ASSESSMENT/PLAN  Jonathon [name] is a 73 y.o. male with  73 y.o. male with hx of anemia, afib on xarleto, anxiety, CKD stage 3, DM without complication, HLD, HTN, Hypothyroidism, PUD presenting as a level 2 trauma after being found on the ground incontinent and shaking. He was given 5mg  of versed  by EMS due to combativeness. He is also on doxycycline  for a toe infection. HR 200s with wide complex QRS, amio bolus given and drip started. GCS 6. Intubated on arrival. CT Head  shows an acute intraparenchymal hematoma within the right lateral temporal lobe with surrounding vasogenic edema and 5 mm midline shift and scattered subarachnoid hemorrhages. PTA xarelto  reversed with K [facility] on Admission 22.  ICH:  right temporoparietal ICH with scattered SAH, etiology: hypertensive in the setting of DOAC CT Head Acute IPH within right lateral temporal lobe with surrounding vasogenic edema and mass effect, midline shift of approximately 5 mm to left. Scattered subarachnoid hemorrhage CTA head & neck Negative for spot sign, intracranial aneurysm, or evidence of vascular malformation. Right sided subdural hematoma (estimated at 4 mm) suspected in addition to intra-axial and subarachnoid blood. Repeat CT head 6/20 early AM: overall stable Right temporoparietal parenchymal hemorrhage with surrounding vasogenic edema and mass effect, not significantly changed.  Midline shift is now 6 mm. Scattered subarachnoid hemorrhage,  similar to prior. Status post [facility]  reversal MRI  with and without contrast: Stable right temporoparietal IPH and associated vasogenic edema. Mass effect and midline shift similar to prior study, measuring 6 mm. Multiple punctate acute/subacute bilateral cortical infarcts 2D Echo: EF 50 to 55%, global hypokinesis, degenerative mitral valve LDL 54 in 08/2023 HgbA1c 5.6 in 08/2023 VTE prophylaxis - SCDs Xarelto  (rivaroxaban ) daily prior to admission, No antithrombotic due to ICH Therapy recommendations:  Pending Disposition: Pending  Cerebral Edema Repeat CT overall stable, shows stable edema  NS consulted, no surgical intervention at this time. 3% bolus of 250 given 6/19 3% hypertonic saline at 75 mL an hour, stopped 6/21 AM Every 6 hour sodium checks, continue x2 after 3% stopped, then daily Na 138-134-136-144-150-148 LTM EEG no seizure-like activity seen, discontinued 6/20  Acute Respiratory Failure ?Aspiration Pneumonia Intubated 6/19 d/t mental status, inability to protect airway Extubated 6/20 afternoon Agree with use of Precedex , wean as able  Atrial fibrillation Home Meds: Xarelto  20 mg daily Continue telemetry monitoring No anticoagulation now due to ICH  Alcohol withdrawal Alcohol abuse Intermittent agitation On Precedex  On CIWA protocol CCM on board for alcohol withdrawal management  Hypertension Home meds: Amlodipine  5 mg daily Unstable, fluctuate Labetalol  IV as needed, hydralazine  IV as needed Cleviprex  gtt. as needed BP goal < 160  Hyperlipidemia Home meds: Lipitor  80 mg LDL 54, goal < 70 Hold off statin for now, as pt can be potentially SATURN candidate  Substance Abuse UDS positive for THC       Ready to quit? N/A TOC consult for cessation placed  Dysphagia Patient has post-stroke dysphagia, SLP consulted Now n.p.o. Failed NG placement x 3 On IV fluid Possible needs for corTrak for feedings  Other Stroke Risk Factors Obesity, Body mass  index is 29.48 kg/m., BMI >/= 30 associated with increased stroke risk, recommend weight loss, diet and exercise as appropriate Former smoker  Other Active Problems Right foot injury, s/p recent graft and recent infection Was on doxycycline , now Unasyn  WBC 4.7-4.6 CKD3b, creatinine 1.50--1.35  Hospital day # 2   Pt seen by Neuro NP/APP with MD. Note/plan to be edited by MD as needed.    Rocky JAYSON Likes, DNP [facility] Please use AMION for contact information & EPIC for messaging.  ATTENDING NOTE: I reviewed above note and agree with the assessment and plan. Pt was seen and examined.   Daughter and wife are at the bedside. Pt obtunded, eyes closed, not open on voice, not following commands. nonverbal. Eyes midline, not tracking bilaterally, not blinking to visual threat, PERRL. Left nasolabial fold seems more flattening. Tongue protrusion not cooperative. No movement of extremities on pain. Sensation,  coordination and gait not tested.  Patient has at least 2 drinks per day at home, currently intermittent agitation consistent with alcohol withdrawal.  On CIWA protocol, CCM on board.  Repeat MRI showed stable hematoma and midline shift.  On Precedex  and IV fluid.  May consider core Trak for tube feeding once able.  For detailed assessment and plan, please refer to above as I have made changes wherever appropriate.   Ary Cummins, MD PhD Stroke Neurology 10/17/2023 6:04 PM  I had long discussion with wife and daughter at bedside, updated pt current condition, treatment plan and potential prognosis, and answered all the questions.  They expressed understanding and appreciation. This patient is critically ill due to ICH and cerebral edema, A-fib off AC now and at significant risk of neurological worsening, death form recurrent stroke, hemorrhagic admission, brain herniation, heart failure. This patient's care requires constant monitoring of vital signs, hemodynamics, respiratory  and cardiac monitoring, review of multiple databases, neurological assessment, discussion with family, other specialists and medical decision making of high complexity. I spent 40 minutes of neurocritical care time in the care of this patient.      To contact Stroke Continuity provider, please refer to [email or web address]. After hours, contact General Neurology

## 2023-10-17 NOTE — Progress Notes (Signed)
 NAME:  Embry Huss, MRN:  [id number], DOB:  01/07/1951, LOS: 2 ADMISSION DATE:  10/15/2023, CONSULTATION DATE: 10/15/2023 REFERRING MD: Emergency department physician, CHIEF COMPLAINT: Intracerebral hemorrhage  History of Present Illness:  73 year old male who was found down by wife poorly responsive required intubation and transportation to the emergency.  CT scan revealed a right intraparenchymal hemorrhage with other areas of injury.  He is currently intubated in atrial fibrillation ventricular response on amiodarone  drip being treated with fentanyl  and propofol  for tube tolerance.  He has been seen by neurosurgery and neurology.  Hyperatremia treatment we await the final results of CT scan of the head to see if there is any surgical options.  Pertinent  Medical History   Past Medical History:  Diagnosis Date   Anemia due to blood loss, acute 2014   2nd GIB   Anxiety    CKD (chronic kidney disease), stage III (HCC) 2014   Diabetes mellitus without complication (HCC)    Gout    High cholesterol    Hypertension 2014   Hypothyroid    Neuropathy    PUD (peptic ulcer disease) 2014     Significant Hospital Events: Including procedures, antibiotic start and stop dates in addition to other pertinent events   6/19 admitted w/ ICH felt 2/2 HTN and c/b SDH p/ fall. Started AEDs. HTNS. Seen by n surg felt unlikely that surg would improve outcome. Seen by neuro. Placed on clevi. Got AC reversed in ER. Unasyn  started for asp 6/20 weaning CT imaging stable ECHO ordered. 1/2 BC + felt contamination but does bring endocarditis into question. Extubated with increased agitation post-extubation, placed on precedex . Patient does drink alcohol every night, placed on CIWA  Interim History / Subjective:   Lest awake and responsive this morning. Sent for stat MRI which shows stable right tempoparietal heterogeneous parenchymal hemorrhage with vasogenic edema, mass effect and mid line shift 6mm.  Multiple foci and extra-axial hemorrhage over the right hemisphere. Multiple punctate/subacute bilateral cortical infarcts.  Objective    Blood pressure (!) 152/91, pulse 97, temperature 97.9 F (36.6 C), resp. rate (!) 22, height 6' (1.829 m), weight 98.6 kg, SpO2 98%.    Vent Mode: CPAP;PSV FiO2 (%):  [40 %] 40 % Set Rate:  [16 bmp] 16 bmp Vt Set:  [620 mL] 620 mL PEEP:  [5 cmH20] 5 cmH20 Pressure Support:  [8 cmH20] 8 cmH20 Plateau Pressure:  [15 cmH20-16 cmH20] 15 cmH20   Intake/Output Summary (Last 24 hours) at 10/17/2023 0749 Last data filed at 10/17/2023 0700 Gross per 24 hour  Intake 3353.13 ml  Output 4900 ml  Net -1546.87 ml   Filed Weights   10/15/23 0944 10/17/23 0500  Weight: 97.4 kg 98.6 kg    Examination:  General elderly male, ill appearing, no acute distress HENT NCAT no JVD Pulm clear to auscultation, no wheezes Card irreg irreg Abd soft Ext warm right to amputation and graft site GU cl yellow  Neuro pupils reactive to light, does not arouse to verbal or tactile stimuli   Resolved problem list   Assessment and Plan  right intraparenchymal hemorrhage involving right temporal lobe w/  vasogenic edema, (R) SDH AND also adjacent scattered subarachnoid hemorrhages primarily within the adjacent cerebral area.  -felt primary injury d/t HTN on DOAC - DOAC reversed in ER -Stable imaging on 6/19.  -LTM neg for seizures to date - MRI 6/21 with stable findings and acute/subacute infarcts Plan BP goal <160  AEDs per neuro Serial neuro checks  LTM per neuro  TTE not showing valvular vegetations Neuro protective support: HOB elevated, glycemic control, avoid fevers  Acute Hypoxemic Respiratory Failure Ventilator management s/p ICH and ineffective airway clearance c/b aspiration PNA - Extubated 6/20 Plan Day 3 Unasyn   Acute Metabolic Encephalopathy - in setting of ICH/SDH and alcohol withdrawal Plan Avoid sedating agents Monitor  Alcohol Withdrawal -  patient has multiple  liquor drinks each night Plan - start IV phenobarbital  taper - thiamine /folate supplementation  Possible bacteremia  1/2 BC + staph organism (not SA) Likely contamination Plan Monitor  Get ECHO No change in abx, continue unasyn   Poorly controlled hypertension Plan Sbp < 160  PRN hydralazine  and labetolol   Diabetes mellitus with poor control Plan Ssi  Goal 140-180   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 (Moderately to severely decreased)  Scr at baseline Plan Avoid hypotension Renal dose meds as appropriate Strict I&O  Fluid and electrolyte imbalance: hypomagnesemia and hypophosphatemia  Plan Replace as indicated  Peripheral vascular disease with neuropathy and loss of right great toe fourth toe as well as debridement and recent skin graft of 2nd toe on right foot plan monitor  Best Practice (right click and Reselect all SmartList Selections daily)   Diet/type: NPO DVT prophylaxis  Pressure ulcer(s): present on admission  GI prophylaxis: PPI Lines: N/A Foley:  N/A Code Status:  full code Last date of multidisciplinary goals of care discussion [tbd]  Critical care time: 38 min      Dorn Chill, MD [facility] Office: [phone]   See Amion for personal pager PCCM on call pager [phone] until 7pm. Please call Elink 7p-7a. [phone]

## 2023-10-17 NOTE — Progress Notes (Signed)
 PT Cancellation Note  Patient Details Name: Verlan Grotz MRN: [id number] DOB: 1950-07-17   Cancelled Treatment:    Reason Eval/Treat Not Completed: Other (comment) pt remains sedated after agitation last night, will continue to follow and evaluate as appropriate.   [name], PT, DPT   Acute Rehabilitation Department Office [phone] Secure Chat Communication Preferred   [name] 10/17/2023, 12:51 PM

## 2023-10-18 ENCOUNTER — Inpatient Hospital Stay (HOSPITAL_COMMUNITY)

## 2023-10-18 DIAGNOSIS — I609 Nontraumatic subarachnoid hemorrhage, unspecified: Secondary | ICD-10-CM | POA: Diagnosis not present

## 2023-10-18 DIAGNOSIS — F10930 Alcohol use, unspecified with withdrawal, uncomplicated: Secondary | ICD-10-CM | POA: Diagnosis not present

## 2023-10-18 DIAGNOSIS — I611 Nontraumatic intracerebral hemorrhage in hemisphere, cortical: Secondary | ICD-10-CM | POA: Diagnosis not present

## 2023-10-18 DIAGNOSIS — S06343A Traumatic hemorrhage of right cerebrum with loss of consciousness of 1 hours to 5 hours 59 minutes, initial encounter: Secondary | ICD-10-CM | POA: Diagnosis not present

## 2023-10-18 DIAGNOSIS — G936 Cerebral edema: Secondary | ICD-10-CM | POA: Diagnosis not present

## 2023-10-18 DIAGNOSIS — G9341 Metabolic encephalopathy: Secondary | ICD-10-CM | POA: Diagnosis not present

## 2023-10-18 DIAGNOSIS — J9601 Acute respiratory failure with hypoxia: Secondary | ICD-10-CM | POA: Diagnosis not present

## 2023-10-18 DIAGNOSIS — R29721 NIHSS score 21: Secondary | ICD-10-CM | POA: Diagnosis not present

## 2023-10-18 LAB — CBC
HCT: 44.7 % (ref 39.0–52.0)
Hemoglobin: 14.6 g/dL (ref 13.0–17.0)
MCH: 31.1 pg (ref 26.0–34.0)
MCHC: 32.7 g/dL (ref 30.0–36.0)
MCV: 95.3 fL (ref 80.0–100.0)
Platelets: 62 10*3/uL — ABNORMAL LOW (ref 150–400)
RBC: 4.69 MIL/uL (ref 4.22–5.81)
RDW: 17.1 % — ABNORMAL HIGH (ref 11.5–15.5)
WBC: 5.7 10*3/uL (ref 4.0–10.5)
nRBC: 0 % (ref 0.0–0.2)

## 2023-10-18 LAB — CULTURE, BLOOD (ROUTINE X 2)

## 2023-10-18 LAB — BASIC METABOLIC PANEL WITH GFR
Anion gap: 10 (ref 5–15)
BUN: 15 mg/dL (ref 8–23)
CO2: 23 mmol/L (ref 22–32)
Calcium: 8.6 mg/dL — ABNORMAL LOW (ref 8.9–10.3)
Chloride: 121 mmol/L — ABNORMAL HIGH (ref 98–111)
Creatinine, Ser: 1.58 mg/dL — ABNORMAL HIGH (ref 0.61–1.24)
GFR, Estimated: 46 mL/min — ABNORMAL LOW (ref >60)
Glucose, Bld: 109 mg/dL — ABNORMAL HIGH (ref 70–99)
Potassium: 3.8 mmol/L (ref 3.5–5.1)
Sodium: 154 mmol/L — ABNORMAL HIGH (ref 135–145)

## 2023-10-18 LAB — GLUCOSE, CAPILLARY
Glucose-Capillary: 127 mg/dL — ABNORMAL HIGH (ref 70–99)
Glucose-Capillary: 95 mg/dL (ref 70–99)
Glucose-Capillary: 114 mg/dL — ABNORMAL HIGH (ref 70–99)
Glucose-Capillary: 122 mg/dL — ABNORMAL HIGH (ref 70–99)
Glucose-Capillary: 132 mg/dL — ABNORMAL HIGH (ref 70–99)
Glucose-Capillary: 113 mg/dL — ABNORMAL HIGH (ref 70–99)

## 2023-10-18 MED ORDER — DOCUSATE SODIUM 50 MG/5ML PO LIQD: 100.0000 mg | Freq: Every day | ORAL | Status: DC | PRN

## 2023-10-18 MED ORDER — PANTOPRAZOLE SODIUM 40 MG IV SOLR
40.0000 mg | INTRAVENOUS | Status: DC
  Administered 2023-10-18 – 2023-10-28 (×11): 40 mg via INTRAVENOUS
  Filled 2023-10-18 (×11): qty 10

## 2023-10-18 MED ORDER — METOPROLOL TARTRATE 25 MG/10 ML ORAL SUSPENSION
25.0000 mg | Freq: Every evening | ORAL | Status: DC
  Administered 2023-10-18 – 2023-10-24 (×6): 25 mg
  Filled 2023-10-18 (×7): qty 10

## 2023-10-18 MED ORDER — VITAL HIGH PROTEIN PO LIQD
1000.0000 mL | ORAL | Status: DC
  Administered 2023-10-18: 1000 mL

## 2023-10-18 NOTE — Progress Notes (Signed)
 No new issues from our standpoint.  Exam stable.  Status post right temporoparietal lobar hemorrhage.  No indication for surgical intervention.  No evidence of any neoplastic disease vascular disease or other concerning findings.  Continue supportive care.

## 2023-10-18 NOTE — Progress Notes (Signed)
 NAME:  Jonathon [name], MRN:  [id number], DOB:  1950/07/04, LOS: 3 ADMISSION DATE:  10/15/2023, CONSULTATION DATE: 10/15/2023 REFERRING MD: Emergency department physician, CHIEF COMPLAINT: Intracerebral hemorrhage  History of Present Illness:  73 year old male who was found down by wife poorly responsive required intubation and transportation to the emergency.  CT scan revealed a right intraparenchymal hemorrhage with other areas of injury.  He is currently intubated in atrial fibrillation ventricular response on amiodarone  drip being treated with fentanyl  and propofol  for tube tolerance.  He has been seen by neurosurgery and neurology.  Hyperatremia treatment we await the final results of CT scan of the head to see if there is any surgical options.  Pertinent  Medical History   Past Medical History:  Diagnosis Date   Anemia due to blood loss, acute 2014   2nd GIB   Anxiety    CKD (chronic kidney disease), stage III (HCC) 2014   Diabetes mellitus without complication (HCC)    Gout    High cholesterol    Hypertension 2014   Hypothyroid    Neuropathy    PUD (peptic ulcer disease) 2014     Significant Hospital Events: Including procedures, antibiotic start and stop dates in addition to other pertinent events   6/19 admitted w/ ICH felt 2/2 HTN and c/b SDH p/ fall. Started AEDs. HTNS. Seen by n surg felt unlikely that surg would improve outcome. Seen by neuro. Placed on clevi. Got AC reversed in ER. Unasyn  started for asp 6/20 weaning CT imaging stable ECHO ordered. 1/2 BC + felt contamination but does bring endocarditis into question. Extubated with increased agitation post-extubation, placed on precedex . Patient does drink alcohol every night, placed on CIWA  Interim History / Subjective:   Remains on precedex .  Family updated at bedside.  Objective    Blood pressure 131/83, pulse (!) 104, temperature 97.9 F (36.6 C), resp. rate (!) 28, height 6' (1.829 m), weight 98.6 kg, SpO2  97%.        Intake/Output Summary (Last 24 hours) at 10/18/2023 0759 Last data filed at 10/18/2023 0700 Gross per 24 hour  Intake 2128.03 ml  Output 3250 ml  Net -1121.97 ml   Filed Weights   10/15/23 0944 10/17/23 0500  Weight: 97.4 kg 98.6 kg    Examination:  General elderly male, ill appearing, no acute distress HENT NCAT no JVD Pulm clear to auscultation, no wheezes Card irreg irreg Abd soft Ext warm right to amputation and graft site GU cl yellow  Neuro pupils reactive to light, does not arouse to verbal or tactile stimuli   Resolved problem list   Assessment and Plan  right intraparenchymal hemorrhage involving right temporal lobe w/  vasogenic edema, (R) SDH AND also adjacent scattered subarachnoid hemorrhages primarily within the adjacent cerebral area.  -felt primary injury d/t HTN on DOAC - DOAC reversed in ER -Stable imaging on 6/19.  -LTM neg for seizures to date - MRI 6/21 with stable findings and acute/subacute infarcts Plan BP goal <160  AEDs per neuro Serial neuro checks LTM per neuro  TTE not showing valvular vegetations Neuro protective support: HOB elevated, glycemic control, avoid fevers  Acute Hypoxemic Respiratory Failure Ventilator management s/p ICH and ineffective airway clearance c/b aspiration PNA - Extubated 6/20 Plan Day 4 Unasyn   Acute Metabolic Encephalopathy - in setting of ICH/SDH and alcohol withdrawal Plan Avoid sedating agents Monitor Wean precedex   Alcohol Withdrawal - patient has multiple  liquor drinks each night Plan - start IV phenobarbital   taper - wean precedex  - thiamine /folate supplementation  Possible bacteremia  1/2 BC + staph organism (not SA) Likely contamination Plan Monitor  Get ECHO No change in abx, continue unasyn   Poorly controlled hypertension Plan Sbp < 160  PRN hydralazine  and labetolol   Diabetes mellitus with poor control Plan Ssi  Goal 140-180   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  (Moderately to severely decreased)  Scr at baseline Plan Avoid hypotension Renal dose meds as appropriate Strict I&O  Fluid and electrolyte imbalance: hypomagnesemia and hypophosphatemia  Plan Replace as indicated  Peripheral vascular disease with neuropathy and loss of right great toe fourth toe as well as debridement and recent skin graft of 2nd toe on right foot plan monitor Obtain mri of right toes to rule out osteomyelitis  Best Practice (right click and Reselect all SmartList Selections daily)   Diet/type: NPO DVT prophylaxis  Pressure ulcer(s): present on admission  GI prophylaxis: PPI Lines: N/A Foley:  N/A Code Status:  full code Last date of multidisciplinary goals of care discussion [tbd]  Critical care time: 35 min      Dorn Chill, MD [facility] Office: [phone]   See Amion for personal pager PCCM on call pager [phone] until 7pm. Please call Elink 7p-7a. [phone]

## 2023-10-18 NOTE — Evaluation (Signed)
 Occupational Therapy Evaluation Patient Details Name: Jonathon [name] MRN: [id number] DOB: Nov 19, 1950 Today's Date: 10/18/2023   History of Present Illness   Pt is a 73 y.o. male who presented 10/15/23 s/p unwitnessed fall followed by worsening mental status. CT Head showed an acute intraparenchymal hematoma within the right lateral temporal lobe with surrounding vasogenic edema and 5 mm midline shift and scattered subarachnoid hemorrhages. Per neuro 6/19 appears to be more consistent with a primary hemorrhage rather than traumatic ICH. However, has some overlying SAH and SDH which is odd for a nontraumatic ICH. Intubated 6/19-6/20. 6/21 MRI stable. CIWA protocol.  PMH: CKD stage IIIb, HTN, Afib, HFrEF, T2DM, gout, HTN, hypothyroid     Clinical Impressions PTA patient independent with mobility using cane, needing some assist for LB Adls and IADLs but otherwise managing Adls.  Admitted for above and presents with problem list below.  Pt restless upon entry, but requires total assist +2 to sit EOB.  Keeps eyes closed during session, unable to sustain opening after assisted.  No verbalizations or attempts of movement. Increased tone in L UE when in trendelenburg position, no response to noxious stimuli.  Requires +2 total assist for all bed mobility and sitting EOB, ADLs.  O2 stable on 6L via [place], SBP drop at EOB (see below).  Based on performance today, believe pt will best benefit from continued OT services acutely and after dc at an inpatient setting with >3hrs/day to optimize independence, safety and return to PLOF with ADLs and mobility.   BP supine 138/78 (97) BP EOB 119/85 (95) BP chair position 45* 152/90 (105)   HR 80-100, 6L 99%     If plan is discharge home, recommend the following:   Two people to help with walking and/or transfers;Two people to help with bathing/dressing/bathroom     Functional Status Assessment   Patient has had a recent decline in their functional status  and demonstrates the ability to make significant improvements in function in a reasonable and predictable amount of time.     Equipment Recommendations   Other (comment) (defer)     Recommendations for Other Services   Rehab consult     Precautions/Restrictions   Precautions Precautions: Fall Recall of Precautions/Restrictions: Impaired Precaution/Restrictions Comments: watch BP, CIWA Restrictions Weight Bearing Restrictions Per Provider Order: No     Mobility Bed Mobility Overal bed mobility: Needs Assistance Bed Mobility: Supine to Sit, Sit to Supine     Supine to sit: Total assist, +2 for physical assistance Sit to supine: Total assist, +2 for physical assistance   General bed mobility comments: to /from EOB, reposition in bed    Transfers                   General transfer comment: deferred      Balance Overall balance assessment: Needs assistance Sitting-balance support: No upper extremity supported, Feet supported Sitting balance-Leahy Scale: Zero Sitting balance - Comments: total assist to maintain upright Postural control: Posterior lean                                 ADL either performed or assessed with clinical judgement   ADL Overall ADL's : Needs assistance/impaired                                       General ADL Comments:  total care     Vision Baseline Vision/History: 0 No visual deficits Vision Assessment?: Vision impaired- to be further tested in functional context Additional Comments: keeps eyes closed, assisted opening but unable to sustain     Perception         Praxis         Pertinent Vitals/Pain Pain Assessment Pain Assessment: CPOT Facial Expression: Relaxed, neutral Body Movements: Absence of movements Muscle Tension: Relaxed Compliance with ventilator (intubated pts.): N/A Vocalization (extubated pts.): Talking in normal tone or no sound CPOT Total: 0      Extremity/Trunk Assessment Upper Extremity Assessment Upper Extremity Assessment: Right hand dominant;Difficult to assess due to impaired cognition;RUE deficits/detail;LUE deficits/detail RUE Deficits / Details: PROM WFL, not actively moving. no response to noxious stimuli LUE Deficits / Details: PROM WFL, no active ROM noted. does have increased tone when pulling pt up in bed/trendelneburg position.   Lower Extremity Assessment Lower Extremity Assessment: Defer to PT evaluation       Communication Communication Communication: Impaired Factors Affecting Communication: Hearing impaired (has hearing aides)   Cognition Arousal: Stuporous Behavior During Therapy: Restless Cognition: Difficult to assess Difficult to assess due to: Level of arousal           OT - Cognition Comments: pt restless upon entry, but fades quickly.  Eyes closed and lethargic.                 Following commands: Impaired Following commands impaired:  (not following commands)     Cueing  General Comments   Cueing Techniques: Verbal cues;Tactile cues  daughter and spouse present.  SBP decreased from 138 to 119  when sitting EOB, recovered to 152 at end of session with [facility] at 45*   Exercises     Shoulder Instructions      Home Living Family/patient expects to be discharged to:: Private residence Living Arrangements: Spouse/significant other Available Help at Discharge: Family Type of Home: House Home Access: Stairs to enter Secretary/administrator of Steps: 3 Entrance Stairs-Rails: Left Home Layout: One level     Bathroom Shower/Tub: Producer, television/film/video: Handicapped height     Home Equipment: Shower seat;Grab bars - tub/shower;Hand held Programmer, systems (2 wheels)          Prior Functioning/Environment Prior Level of Function : Independent/Modified Independent;History of Falls (last six months)             Mobility Comments: cane, was going to  outpatient PT ADLs Comments: some assist for LB dressing, otherwise independent ADLs.  spouse assists with IADLs (spouse driving and managing meds)    OT Problem List: Decreased strength;Decreased range of motion;Decreased activity tolerance;Impaired balance (sitting and/or standing);Impaired vision/perception;Decreased coordination;Decreased cognition;Decreased safety awareness;Decreased knowledge of use of DME or AE;Decreased knowledge of precautions;Cardiopulmonary status limiting activity;Impaired UE functional use;Obesity;Impaired sensation;Impaired tone   OT Treatment/Interventions: Self-care/ADL training;Therapeutic exercise;DME and/or AE instruction;Cognitive remediation/compensation;Therapeutic activities;Patient/family education;Balance training;Visual/perceptual remediation/compensation;Neuromuscular education;Splinting      OT Goals(Current goals can be found in the care plan section)   Acute Rehab OT Goals Patient Stated Goal: unable OT Goal Formulation: Patient unable to participate in goal setting Time For Goal Achievement: 11/01/23 Potential to Achieve Goals: Fair   OT Frequency:  Min 2X/week    Co-evaluation PT/OT/SLP Co-Evaluation/Treatment: Yes Reason for Co-Treatment: For patient/therapist safety;To address functional/ADL transfers   OT goals addressed during session: ADL's and self-care      AM-PAC OT 6 Clicks Daily Activity     Outcome Measure  Help from another person eating meals?: Total Help from another person taking care of personal grooming?: Total Help from another person toileting, which includes using toliet, bedpan, or urinal?: Total Help from another person bathing (including washing, rinsing, drying)?: Total Help from another person to put on and taking off regular upper body clothing?: Total Help from another person to put on and taking off regular lower body clothing?: Total 6 Click Score: 6   End of Session Equipment Utilized During  Treatment: Oxygen (8L) Nurse Communication: Mobility status;Precautions  Activity Tolerance: Patient limited by lethargy Patient left: in bed;with call bell/phone within reach;with bed alarm set;with family/visitor present;with nursing/sitter in room;with restraints reapplied  OT Visit Diagnosis: Other abnormalities of gait and mobility (R26.89);Muscle weakness (generalized) (M62.81);Other symptoms and signs involving the nervous system (R29.898);Other symptoms and signs involving cognitive function                Time: 9093-9069 OT Time Calculation (min): 24 min Charges:  OT General Charges $OT Visit: 1 Visit OT Evaluation $OT Eval Moderate Complexity: 1 Mod  Etta NOVAK, OT Acute Rehabilitation Services Office [phone] Secure Chat Preferred    Etta GORMAN [place] 10/18/2023, 1:42 PM

## 2023-10-18 NOTE — Progress Notes (Signed)

## 2023-10-18 NOTE — Progress Notes (Addendum)
 STROKE TEAM PROGRESS NOTE    SIGNIFICANT HOSPITAL EVENTS  6/19: level 2 trauma after being found on the ground incontinent and shaking.  HR 200s with wide complex QRS, amio bolus given and drip started.  GCS 6. Intubated on arrival.  CT Head shows an acute intraparenchymal hematoma within the right lateral temporal lobe with surrounding vasogenic edema and 5 mm midline shift and scattered subarachnoid hemorrhages.  PTA xarelto  reversed with [facility] on Admission 22. 6/20: Repeat CT head early this a.m. stable. Extubated in afternoon. 6/21: MRI stable IPH, multiple acute/subacute cortical infarcts seen. 3% stopped.   INTERIM HISTORY/SUBJECTIVE  Wife and daughter at bedside.  Patient obtunded similar exam to yesterday.  Weaning Precedex  as able. Prior to exam patient was moving all extremities and restless.  He was able to sit up on the edge of the bed with PT  Blood pressure adequately controlled.  Not requiring blood pressure drip     OBJECTIVE  CBC    Component Value Date/Time   WBC 5.7 10/18/2023 0703   RBC 4.69 10/18/2023 0703   HGB 14.6 10/18/2023 0703   HGB 17.4 08/12/2023 1622   HCT 44.7 10/18/2023 0703   HCT 53.7 (H) 08/12/2023 1622   PLT 62 (L) 10/18/2023 0703   PLT 127 (L) 08/12/2023 1622   MCV 95.3 10/18/2023 0703   MCV 93 08/12/2023 1622   MCH 31.1 10/18/2023 0703   MCHC 32.7 10/18/2023 0703   RDW 17.1 (H) 10/18/2023 0703   RDW 13.1 08/12/2023 1622   LYMPHSABS 0.9 08/31/2023 1632   LYMPHSABS 0.8 08/04/2022 1459   MONOABS 0.3 08/31/2023 1632   EOSABS 0.0 08/31/2023 1632   EOSABS 0.1 08/04/2022 1459   BASOSABS 0.0 08/31/2023 1632   BASOSABS 0.0 08/04/2022 1459    BMET    Component Value Date/Time   NA 154 (H) 10/18/2023 0703   NA 138 08/31/2023 1632   K 3.8 10/18/2023 0703   CL 121 (H) 10/18/2023 0703   CO2 23 10/18/2023 0703   GLUCOSE 109 (H) 10/18/2023 0703   BUN 15 10/18/2023 0703   BUN 15 08/31/2023 1632   CREATININE 1.58 (H) 10/18/2023  0703   CALCIUM  8.6 (L) 10/18/2023 0703   EGFR 51 (L) 08/31/2023 1632   GFRNONAA 46 (L) 10/18/2023 0703    IMAGING past 24 hours DG Abd Portable 1V Result Date: 10/18/2023 CLINICAL DATA:  Evaluate feeding tube placement. EXAM: PORTABLE ABDOMEN - 1 VIEW COMPARISON:  10/16/2023 FINDINGS: No enteric tube is identified within the imaged portions of the lower chest and abdomen. Mild gaseous distension of the stomach. Nonobstructive bowel gas pattern. IMPRESSION: 1. No enteric tube is identified within the imaged portions of the lower chest and abdomen. 2. Mild gaseous distension of the stomach. Electronically Signed   By: Waddell Calk M.D.   On: 10/18/2023 11:22    Vitals:   10/18/23 0900 10/18/23 1000 10/18/23 1100 10/18/23 1200  BP: 138/78 (!) 153/87 (!) 146/76 (!) 147/86  Pulse: (!) 106 98 90 (!) 105  Resp: (!) 27 (!) 29 (!) 31 (!) 28  Temp: 99.3 F (37.4 C) 100 F (37.8 C) 100 F (37.8 C) 99.5 F (37.5 C)  TempSrc:    Bladder  SpO2: 97% 100% 94% 100%  Weight:      Height:        PHYSICAL EXAM General: Elderly male, acutely ill CV: paroxysmal Afib on monitor Respiratory: Intubated, mechanically ventilated on full support   NEURO:   Temp:  [  96.6 F (35.9 C)-100 F (37.8 C)] 99.5 F (37.5 C) (06/22 1200) Pulse Rate:  [68-107] 105 (06/22 1200) Resp:  [21-34] 28 (06/22 1200) BP: (105-153)/(76-98) 147/86 (06/22 1200) SpO2:  [68 %-100 %] 100 % (06/22 1200)  Neuro: Mental Status: Patient had received 4mg  Ativan  within 12 hours of our exam.  Obtunded, does not open eyes, does not follow commands.  Cranial Nerves:  II: PERRL.  III, IV, VI: Bilateral blink to threat with forced eye opening. Does not track.  VII: Flat left nasolabial fold IX & X: Cough and Gag reflexes decreased Motor:  BUE: Localizes to pain, with right arm seemingly stronger than left. BLE: Spontaneous movement seen, antigravity strength Per RN, when patient Is restless he is able to move all 4  extremities with resistance strength,  Tone: is normal and bulk is normal Sensation-withdraws in all 4 extremities Coordination: Not tested Gait- deferred  Most Recent NIH: 21     ASSESSMENT/PLAN  [name] is a 73 y.o. male with  73 y.o. male with hx of anemia, afib on xarleto, anxiety, CKD stage 3, DM without complication, HLD, HTN, Hypothyroidism, PUD presenting as a level 2 trauma after being found on the ground incontinent and shaking. He was given 5mg  of versed  by EMS due to combativeness. He is also on doxycycline  for a toe infection. HR 200s with wide complex QRS, amio bolus given and drip started. GCS 6. Intubated on arrival. CT Head shows an acute intraparenchymal hematoma within the right lateral temporal lobe with surrounding vasogenic edema and 5 mm midline shift and scattered subarachnoid hemorrhages. PTA xarelto  reversed with [facility] on Admission 22.  ICH:  right temporoparietal ICH with scattered SAH, etiology: hypertensive in the setting of DOAC CT Head Acute IPH within right lateral temporal lobe with surrounding vasogenic edema and mass effect, midline shift of approximately 5 mm to left. Scattered subarachnoid hemorrhage CTA head & neck Negative for spot sign, intracranial aneurysm, or evidence of vascular malformation. Right sided subdural hematoma (estimated at 4 mm) suspected in addition to intra-axial and subarachnoid blood. Repeat CT head 6/20 early AM: overall stable Right temporoparietal parenchymal hemorrhage with surrounding vasogenic edema and mass effect, not significantly changed.  Midline shift is now 6 mm. Scattered subarachnoid hemorrhage, similar to prior. Status post [facility]  reversal MRI  with and without contrast: Stable right temporoparietal IPH and associated vasogenic edema. Mass effect and midline shift similar to prior study, measuring 6 mm. Multiple punctate acute/subacute bilateral cortical infarcts 2D Echo: EF 50 to 55%, global  hypokinesis, degenerative mitral valve LDL 54 in 08/2023 HgbA1c 5.6 in 08/2023 VTE prophylaxis - SCDs Xarelto  (rivaroxaban ) daily prior to admission, continue No antithrombotic due to ICH Therapy recommendations:  CIR Disposition: Pending  Cerebral Edema Repeat CT overall stable, shows stable edema  NS consulted, no surgical intervention at this time. 3% bolus of 250 given 6/19 3% hypertonic saline at 75 mL an hour, stopped 6/21 -> on NS @ 50 Every 6 hour sodium checks, continue x2 after 3% stopped, then daily Na 138-134-136-144-150-148-154 LTM EEG no seizure-like activity seen, discontinued 6/20  Acute Respiratory Failure ?Aspiration Pneumonia Intubated 6/19 d/t mental status, inability to protect airway Extubated 6/20 afternoon On Precedex , wean as able  Atrial fibrillation Home Meds: Xarelto  20 mg daily Continue telemetry monitoring No anticoagulation now due to ICH  Alcohol withdrawal Alcohol abuse Intermittent agitation On Precedex , wean as able Continue CIWA protocol On phenobarbital  taper   Hypertension Home meds: Amlodipine  5  mg daily Unstable, fluctuating especially with restlessness Labetalol  IV as needed, hydralazine  IV as needed Cleviprex  gtt. as needed BP goal < 160  Hyperlipidemia Home meds: Lipitor  80 mg LDL 54, goal < 70 Consider to resume statin on discharge  Substance Abuse UDS positive for THC       Ready to quit? N/A TOC consult for cessation placed  Dysphagia Patient has post-stroke dysphagia, SLP consulted Now n.p.o. Failed NG placement x 3, CCM to attempt today Possible need for corTrak for feedings  Other Stroke Risk Factors Obesity, Body mass index is 29.48 kg/m., BMI >/= 30 associated with increased stroke risk, recommend weight loss, diet and exercise as appropriate Former smoker  Other Active Problems Right foot injury, s/p recent graft and recent infection Was on doxycycline , now Unasyn  WBC 4.7-4.6 CKD3b, creatinine  1.50--1.35--1.58  Hospital day # 3   Pt seen by Neuro NP/APP with MD. Note/plan to be edited by MD as needed.    Rocky JAYSON Likes, DNP [facility] Please use AMION for contact information & EPIC for messaging.  ATTENDING NOTE: I reviewed above note and agree with the assessment and plan. Pt was seen and examined.   Wife and daughter are at the bedside. Pt lying in bed, still obtunded, not open eyes or following commands.  With forced eye opening, PERRL, eyes midline.  No movement on pain semination.  Patient phenobarbital  taper for alcohol withdrawal, reported agitation/combative after medication wears off.  MRI yesterday showed stable hematoma and midline shift.  CCM Dr. Joellyn will try NG tube placement again today, if not successful, consider core Trak tomorrow.  Continue supportive care and medical management.  For detailed assessment and plan, please refer to above as I have made changes wherever appropriate.   Ary Cummins, MD PhD Stroke Neurology 10/18/2023 2:54 PM  I had long discussion with wife and daughter at bedside, updated pt current condition, treatment plan and potential prognosis, and answered all the questions.  They expressed understanding and appreciation. This patient is critically ill due to ICH and cerebral edema, A-fib off AC now and at significant risk of neurological worsening, death form recurrent stroke, hemorrhagic admission, brain herniation, heart failure. This patient's care requires constant monitoring of vital signs, hemodynamics, respiratory and cardiac monitoring, review of multiple databases, neurological assessment, discussion with family, other specialists and medical decision making of high complexity. I spent 40 minutes of neurocritical care time in the care of this patient.

## 2023-10-18 NOTE — Evaluation (Signed)
 Physical Therapy Evaluation Patient Details Name: Jonathon [name] MRN: [id number] DOB: [birth year] Today's Date: 10/18/2023  History of Present Illness  Pt is a 73 y.o. male who presented 10/15/23 s/p unwitnessed fall followed by worsening mental status. CT Head showed an acute intraparenchymal hematoma within the right lateral temporal lobe with surrounding vasogenic edema and 5 mm midline shift and scattered subarachnoid hemorrhages. Per neuro 6/19 appears to be more consistent with a primary hemorrhage rather than traumatic ICH. However, has some overlying SAH and SDH which is odd for a nontraumatic ICH. Intubated 6/19-6/20. 6/21 MRI stable. CIWA protocol.  PMH: CKD stage IIIb, HTN, Afib, HFrEF, T2DM, gout, HTN, hypothyroid.    Clinical Impression  Pt in bed upon arrival of PT, agreeable to evaluation at this time. Prior to admission the pt was independent with all mobility despite being largely sedentary. The pt lives with his spouse in a home with 1 step to enter and was typically leaving the house 1x/week. Despite being restless and moving extremities prior to session, once PT engaged with pt to facilitate sitting EOB, pt with decreased responsiveness, no restlessness or spontaneous movement, and no attempts to follow any commands. Pt dependent on assist to complete bed mobility, maintain static sitting balance at EOB, and with no attempt to open eyes, visually attend, or track stimuli. Pt with noted drop in BP while sitting EOB that recovered quickly once supine. Will continue to progress acutely but anticipate will need further intensive therapies after d/c to facilitate return to maximal independence.      If plan is discharge home, recommend the following: Two people to help with walking and/or transfers;Two people to help with bathing/dressing/bathroom;Assistance with feeding;Assistance with cooking/housework;Direct supervision/assist for medications management;Assist for  transportation;Direct supervision/assist for financial management;Help with stairs or ramp for entrance;Supervision due to cognitive status   Can travel by private vehicle        Equipment Recommendations  (defer until further transfers assessed)  Recommendations for Other Services  Rehab consult    Functional Status Assessment Patient has had a recent decline in their functional status and demonstrates the ability to make significant improvements in function in a reasonable and predictable amount of time.     Precautions / Restrictions Precautions Precautions: Fall Recall of Precautions/Restrictions: Impaired Precaution/Restrictions Comments: watch BP, CIWA Restrictions Weight Bearing Restrictions Per Provider Order: No      Mobility  Bed Mobility Overal bed mobility: Needs Assistance Bed Mobility: Supine to Sit, Sit to Supine     Supine to sit: Total assist, +2 for physical assistance Sit to supine: Total assist, +2 for physical assistance   General bed mobility comments: to /from EOB, reposition in bed. no attempt from pt to assist    Transfers                   General transfer comment: deferred    Modified Rankin (Stroke Patients Only) Modified Rankin (Stroke Patients Only) Pre-Morbid Rankin Score: No symptoms Modified Rankin: Severe disability     Balance Overall balance assessment: Needs assistance Sitting-balance support: No upper extremity supported, Feet supported Sitting balance-Leahy Scale: Zero Sitting balance - Comments: total assist to maintain upright Postural control: Posterior lean                                   Pertinent Vitals/Pain Pain Assessment Pain Assessment: CPOT Facial Expression: Relaxed, neutral Body Movements: Absence of movements Muscle Tension:  Relaxed Compliance with ventilator (intubated pts.): N/A Vocalization (extubated pts.): Talking in normal tone or no sound CPOT Total: 0 Pain  Intervention(s): Monitored during session, Repositioned    Home Living Family/patient expects to be discharged to:: Private residence Living Arrangements: Spouse/significant other Available Help at Discharge: Family Type of Home: House Home Access: Stairs to enter Entrance Stairs-Rails: Left Entrance Stairs-Number of Steps: 3   Home Layout: One level Home Equipment: Shower seat;Grab bars - tub/shower;Hand held Programmer, systems (2 wheels)      Prior Function Prior Level of Function : Independent/Modified Independent;History of Falls (last six months)             Mobility Comments: cane, was going to outpatient PT ADLs Comments: some assist for LB dressing, otherwise independent ADLs.  spouse assists with IADLs (spouse driving and managing meds)     Extremity/Trunk Assessment   Upper Extremity Assessment Upper Extremity Assessment: Defer to OT evaluation RUE Deficits / Details: PROM WFL, not actively moving. no response to noxious stimuli LUE Deficits / Details: PROM WFL, no active ROM noted. does have increased tone when pulling pt up in bed/[place] position.    Lower Extremity Assessment Lower Extremity Assessment: Difficult to assess due to impaired cognition (ROM WFL, pt observed moving against gravity some prior to arrival, no active movement spontaneously or to command noted during session)    Cervical / Trunk Assessment Cervical / Trunk Assessment: Kyphotic;Other exceptions Cervical / Trunk Exceptions: large body habitus  Communication   Communication Communication: Impaired Factors Affecting Communication: Hearing impaired (has hearing aides)    Cognition Arousal: Stuporous Behavior During Therapy: Restless, Flat affect   PT - Cognitive impairments: Difficult to assess Difficult to assess due to: Level of arousal                     PT - Cognition Comments: pt initially restless upon arrival, moving extremities towards EOB, once  assisted to EOB pt with decreased restlessness and no response to any command Following commands: Impaired Following commands impaired:  (not following commands)     Cueing Cueing Techniques: Verbal cues, Tactile cues     General Comments General comments (skin integrity, edema, etc.): daughter and spouse present.  SBP decreased from 138 to 119  when sitting EOB, recovered to 152 at end of session with [facility] at 45*        Assessment/Plan    PT Assessment Patient needs continued PT services  PT Problem List Decreased strength;Decreased activity tolerance;Decreased balance;Decreased mobility;Decreased coordination;Decreased cognition;Decreased safety awareness;Obesity       PT Treatment Interventions DME instruction;Gait training;Stair training;Functional mobility training;Therapeutic activities;Therapeutic exercise;Balance training;Neuromuscular re-education;Cognitive remediation;Patient/family education    PT Goals (Current goals can be found in the Care Plan section)  Acute Rehab PT Goals Patient Stated Goal: none stated PT Goal Formulation: With patient/family Time For Goal Achievement: 11/01/23 Potential to Achieve Goals: Good    Frequency Min 2X/week     Co-evaluation PT/OT/SLP Co-Evaluation/Treatment: Yes Reason for Co-Treatment: For patient/therapist safety;To address functional/ADL transfers PT goals addressed during session: Mobility/safety with mobility;Balance;Strengthening/ROM OT goals addressed during session: ADL's and self-care       AM-PAC PT 6 Clicks Mobility  Outcome Measure Help needed turning from your back to your side while in a flat bed without using bedrails?: Total Help needed moving from lying on your back to sitting on the side of a flat bed without using bedrails?: Total Help needed moving to and from a bed to a  chair (including a wheelchair)?: Total Help needed standing up from a chair using your arms (e.g., wheelchair or bedside chair)?:  Total Help needed to walk in hospital room?: Total Help needed climbing 3-5 steps with a railing? : Total 6 Click Score: 6    End of Session Equipment Utilized During Treatment: Oxygen Activity Tolerance: Patient limited by lethargy Patient left: in bed;with call bell/phone within reach;with bed alarm set;with restraints reapplied;with family/visitor present Nurse Communication: Mobility status;Need for lift equipment PT Visit Diagnosis: Unsteadiness on feet (R26.81);Muscle weakness (generalized) (M62.81);History of falling (Z91.81)    Time: 9093-9068 PT Time Calculation (min) (ACUTE ONLY): 25 min   Charges:   PT Evaluation $PT Eval High Complexity: 1 High   PT General Charges $$ ACUTE PT VISIT: 1 Visit         [name], PT, DPT   Acute Rehabilitation Department Office [phone] Secure Chat Communication Preferred  [name] 10/18/2023, 2:11 PM

## 2023-10-19 ENCOUNTER — Inpatient Hospital Stay (HOSPITAL_COMMUNITY)

## 2023-10-19 ENCOUNTER — Encounter (HOSPITAL_COMMUNITY): Payer: Self-pay | Admitting: Pulmonary Disease

## 2023-10-19 DIAGNOSIS — R29721 NIHSS score 21: Secondary | ICD-10-CM | POA: Diagnosis not present

## 2023-10-19 DIAGNOSIS — F10930 Alcohol use, unspecified with withdrawal, uncomplicated: Secondary | ICD-10-CM | POA: Diagnosis not present

## 2023-10-19 DIAGNOSIS — I611 Nontraumatic intracerebral hemorrhage in hemisphere, cortical: Secondary | ICD-10-CM | POA: Diagnosis not present

## 2023-10-19 DIAGNOSIS — J9601 Acute respiratory failure with hypoxia: Secondary | ICD-10-CM | POA: Diagnosis not present

## 2023-10-19 DIAGNOSIS — I609 Nontraumatic subarachnoid hemorrhage, unspecified: Secondary | ICD-10-CM | POA: Diagnosis not present

## 2023-10-19 DIAGNOSIS — S065XAA Traumatic subdural hemorrhage with loss of consciousness status unknown, initial encounter: Secondary | ICD-10-CM | POA: Diagnosis not present

## 2023-10-19 DIAGNOSIS — R000 Tachycardia, unspecified: Secondary | ICD-10-CM | POA: Diagnosis not present

## 2023-10-19 DIAGNOSIS — J9600 Acute respiratory failure, unspecified whether with hypoxia or hypercapnia: Secondary | ICD-10-CM

## 2023-10-19 DIAGNOSIS — E440 Moderate protein-calorie malnutrition: Secondary | ICD-10-CM | POA: Insufficient documentation

## 2023-10-19 DIAGNOSIS — G936 Cerebral edema: Secondary | ICD-10-CM | POA: Diagnosis not present

## 2023-10-19 LAB — BASIC METABOLIC PANEL WITH GFR
Anion gap: 12 (ref 5–15)
BUN: 19 mg/dL (ref 8–23)
CO2: 26 mmol/L (ref 22–32)
Calcium: 8.8 mg/dL — ABNORMAL LOW (ref 8.9–10.3)
Chloride: 113 mmol/L — ABNORMAL HIGH (ref 98–111)
Creatinine, Ser: 1.5 mg/dL — ABNORMAL HIGH (ref 0.61–1.24)
GFR, Estimated: 49 mL/min — ABNORMAL LOW (ref >60)
Glucose, Bld: 124 mg/dL — ABNORMAL HIGH (ref 70–99)
Potassium: 3.4 mmol/L — ABNORMAL LOW (ref 3.5–5.1)
Sodium: 151 mmol/L — ABNORMAL HIGH (ref 135–145)

## 2023-10-19 LAB — GLUCOSE, CAPILLARY
Glucose-Capillary: 114 mg/dL — ABNORMAL HIGH (ref 70–99)
Glucose-Capillary: 114 mg/dL — ABNORMAL HIGH (ref 70–99)
Glucose-Capillary: 118 mg/dL — ABNORMAL HIGH (ref 70–99)
Glucose-Capillary: 134 mg/dL — ABNORMAL HIGH (ref 70–99)
Glucose-Capillary: 139 mg/dL — ABNORMAL HIGH (ref 70–99)
Glucose-Capillary: 157 mg/dL — ABNORMAL HIGH (ref 70–99)

## 2023-10-19 LAB — CBC
HCT: 45.4 % (ref 39.0–52.0)
Hemoglobin: 14.6 g/dL (ref 13.0–17.0)
MCH: 30.5 pg (ref 26.0–34.0)
MCHC: 32.2 g/dL (ref 30.0–36.0)
MCV: 94.8 fL (ref 80.0–100.0)
Platelets: 79 10*3/uL — ABNORMAL LOW (ref 150–400)
RBC: 4.79 MIL/uL (ref 4.22–5.81)
RDW: 17.2 % — ABNORMAL HIGH (ref 11.5–15.5)
WBC: 5.8 10*3/uL (ref 4.0–10.5)
nRBC: 0 % (ref 0.0–0.2)

## 2023-10-19 LAB — MAGNESIUM: Magnesium: 1.6 mg/dL — ABNORMAL LOW (ref 1.7–2.4)

## 2023-10-19 MED ORDER — ROCURONIUM BROMIDE 10 MG/ML (PF) SYRINGE: 60.0000 mg | PREFILLED_SYRINGE | Freq: Once | INTRAVENOUS | Status: AC

## 2023-10-19 MED ORDER — PROSOURCE TF20 ENFIT COMPATIBL EN LIQD
60.0000 mL | Freq: Two times a day (BID) | ENTERAL | Status: DC
  Administered 2023-10-19 – 2023-10-29 (×21): 60 mL
  Filled 2023-10-19 (×21): qty 60

## 2023-10-19 MED ORDER — FENTANYL CITRATE PF 50 MCG/ML IJ SOSY
PREFILLED_SYRINGE | INTRAMUSCULAR | Status: AC
  Administered 2023-10-19: 50 ug via INTRAVENOUS
  Filled 2023-10-19: qty 2

## 2023-10-19 MED ORDER — MIDAZOLAM HCL 2 MG/2ML IJ SOLN
INTRAMUSCULAR | Status: AC
  Filled 2023-10-19: qty 2

## 2023-10-19 MED ORDER — PHENYLEPHRINE 80 MCG/ML (10ML) SYRINGE FOR IV PUSH (FOR BLOOD PRESSURE SUPPORT)
PREFILLED_SYRINGE | INTRAVENOUS | Status: AC
  Administered 2023-10-19: 20 ug
  Filled 2023-10-19: qty 10

## 2023-10-19 MED ORDER — OSMOLITE 1.5 CAL PO LIQD
1000.0000 mL | ORAL | Status: DC
  Administered 2023-10-19 – 2023-10-28 (×7): 1000 mL

## 2023-10-19 MED ORDER — ORAL CARE MOUTH RINSE
15.0000 mL | OROMUCOSAL | Status: DC
  Administered 2023-10-19 – 2023-10-29 (×119): 15 mL via OROMUCOSAL

## 2023-10-19 MED ORDER — LACTATED RINGERS IV BOLUS
1000.0000 mL | Freq: Once | INTRAVENOUS | Status: AC
  Administered 2023-10-19: 1000 mL via INTRAVENOUS

## 2023-10-19 MED ORDER — ORAL CARE MOUTH RINSE: 15.0000 mL | OROMUCOSAL | Status: DC | PRN

## 2023-10-19 MED ORDER — POTASSIUM CHLORIDE 20 MEQ PO PACK
40.0000 meq | PACK | Freq: Once | ORAL | Status: AC
  Administered 2023-10-19: 40 meq
  Filled 2023-10-19: qty 2

## 2023-10-19 MED ORDER — ETOMIDATE 2 MG/ML IV SOLN
INTRAVENOUS | Status: AC
  Administered 2023-10-19: 20 mg via INTRAVENOUS
  Filled 2023-10-19: qty 20

## 2023-10-19 MED ORDER — LACTATED RINGERS IV SOLN: INTRAVENOUS | Status: DC

## 2023-10-19 MED ORDER — ROCURONIUM BROMIDE 10 MG/ML (PF) SYRINGE
PREFILLED_SYRINGE | INTRAVENOUS | Status: AC
  Administered 2023-10-19: 60 mg via INTRAVENOUS
  Filled 2023-10-19: qty 10

## 2023-10-19 MED ORDER — FENTANYL CITRATE PF 50 MCG/ML IJ SOSY: 50.0000 ug | PREFILLED_SYRINGE | Freq: Once | INTRAMUSCULAR | Status: AC

## 2023-10-19 MED ORDER — ETOMIDATE 2 MG/ML IV SOLN: 20.0000 mg | Freq: Once | INTRAVENOUS | Status: AC

## 2023-10-19 MED ORDER — PHENYLEPHRINE 80 MCG/ML (10ML) SYRINGE FOR IV PUSH (FOR BLOOD PRESSURE SUPPORT): 80.0000 ug | PREFILLED_SYRINGE | Freq: Once | INTRAVENOUS | Status: DC | PRN

## 2023-10-19 NOTE — Progress Notes (Signed)
Patient transported to CT and back without complications. RN at bedside.  

## 2023-10-19 NOTE — Progress Notes (Addendum)
 0700Report performed at bedside with night nurse, was informed of overnight agitation, as well as respiratory decompensation requiring NRB to be applied for a short period.   During report, patient was on 5L [place]. O2 saturations were low 90s, and titrated oxygen to 6L. RN came back to bedside and applied NRB at 15L for O2 saturations of mid 70s. Dr. Neda and Corean Reese, PA-C, with CCM team notified and came to bedside to assess. Awaiting new orders at this time, patients vital signs are within normal limits at this time on 15L of oxygen.   0900- The conversation to intubate patient was discussed with Arlean Pean, wife, and Tahjai Schetter, daughter, at bedside with RN and Corean Reese. Benefits and risks were discussed, and Arlean agreed to continue with intubation. Patient was intubated at 0933 by Dr. Neda.   Lochlin Eppinger, RN

## 2023-10-19 NOTE — TOC Progression Note (Signed)
 Transition of Care [facility]) - Progression Note    Patient Details  Name: Jonathon [name] MRN: [id number] Date of Birth: 02/16/1951  Transition of [facility]) CM/SW Contact  Robynn Eileen Hoose, RN Phone Number: 10/19/2023, 2:19 PM  Clinical Narrative:   Chart reviewed. PT/OT recs for CIR. Pt re-intubated today, having fever and requiring increased oxygen use.          Expected Discharge Plan and Services                                               Social Determinants of Health (SDOH) Interventions SDOH Screenings   Food Insecurity: No Food Insecurity (05/30/2022)  Housing: Low Risk  (05/30/2022)  Transportation Needs: No Transportation Needs (05/30/2022)  Utilities: Not At Risk (05/30/2022)  Alcohol Screen: Low Risk  (05/30/2022)  Depression (PHQ2-9): Low Risk  (10/01/2023)  Financial Resource Strain: Low Risk  (05/30/2022)  Physical Activity: Inactive (05/30/2022)  Social Connections: Moderately Isolated (05/30/2022)  Stress: No Stress Concern Present (05/30/2022)  Tobacco Use: Medium Risk (10/01/2023)    Readmission Risk Interventions     No data to display

## 2023-10-19 NOTE — Progress Notes (Signed)
 SLP Cancellation Note  Patient Details Name: Jonathon [name] MRN: [id number] DOB: 13-Jul-1950   Cancelled treatment:       Reason Eval/Treat Not Completed: Patient not medically ready   Ceciley Buist, Consuelo Fitch 10/19/2023, 11:27 AM

## 2023-10-19 NOTE — Procedures (Signed)
 Intubation Procedure Note  Jonathon [name]  [id number]  [birth year]  Date:10/19/23  Time:9:38 AM   Provider Performing:Macedonio Scallon A Teran Knittle    Procedure: Intubation (31500)  Indication(s) Respiratory Failure  Consent Discussed with family   Anesthesia Etomidate , Fentanyl , and Rocuronium  20 etomidate , 50 fentanyl , 60 rocuronium   Time Out Verified patient identification, verified procedure, site/side was marked, verified correct patient position, special equipment/implants available, medications/allergies/relevant history reviewed, required imaging and test results available.   Sterile Technique Usual hand hygeine, masks, and gloves were used   Procedure Description Patient positioned in bed supine.  Sedation given as noted above.  Patient was intubated with endotracheal tube using Glidescope.  View was Grade 1 full glottis .  Number of attempts was 1.  Colorimetric CO2 detector was consistent with tracheal placement.   Complications/Tolerance None; patient tolerated the procedure well. Chest X-ray is ordered to verify placement.   EBL None   Specimen(s) None

## 2023-10-19 NOTE — Progress Notes (Addendum)
 Initial Nutrition Assessment  DOCUMENTATION CODES:   Non-severe (moderate) malnutrition in context of social or environmental circumstances  INTERVENTION:  Initiate tube feeding via NG: begin at 39ml/h and increase by 10ml every 8 hours until goal rate reached  Osmolite 1.5 at 55 ml/h (1320 ml per day)  Prosource TF20 60 ml BID  Provides 2140 kcal, 122 gm protein, 1005 ml free water daily   Pt is at risk for refeeding syndrome given moderate malnutrition and CIWA. Monitor magnesium  and phosphorus daily x 2 days, MD to replete as needed. 100mg  thiamine  x 5 days  Continued folic acid  1mg  daily Continued MVI w/ minerals daily  NUTRITION DIAGNOSIS:  Moderate Malnutrition related to social / environmental circumstances (lack of appetite, alcohol intake, recent decline) as evidenced by energy intake < 75% for > or equal to 3 months, moderate fat depletion, moderate muscle depletion, percent weight loss.  GOAL:  Patient will meet greater than or equal to 90% of their needs  MONITOR:   Labs, TF tolerance  REASON FOR ASSESSMENT:   Consult Enteral/tube feeding initiation and management  ASSESSMENT:   Pt with hx of diabetes, CKD III, neuropathy, HTN, and high cholesterol. Pt admitted following being found unresponsive on ground, diagnosed with R lateral midline shift and multiple hemorrhages.  6/19 admitted, intubated, amio started 6/20 extubated, CIWA protocol initiated 6/23 re-intubated  Pt intubated and sedated at time of assessment. Wife and daughters present at bedside during assessment. Wife reports pt's appetite was poor at home PTA. Wife reports pt ate 2x per day which included coffee and a light pastry/eggs in the morning then half a sandwich or meal in the afternoon (tomato and bacon sandwich, beef and beans, tuna). Wife reports she would encourage pt to eat more, but he insisted he had a low appetite and experienced taste changes. Wife reports pt would drink about 20 oz.  Water per day, but also drank whiskey 2x per day and occasionally a margarita once per week as well. Wife reports pt's mobility had been declining and required assistance from a person as well as a cane while walking.   Pt's wife also reports pt has lost weight in the last 6 months. Wife noticed a 13# weight loss over a 6 week span (227--> 214#) in between pt's recent doctor visits with his most recent weight being 214#. Wife expressed concern about weight loss with low appetite in combination with unhealed skin ulcer on R foot. Weight loss of 6% in 6 weeks is clinically significant.   Pt currently on trickle feeds for gut stimulation, but MD cleared tube feeds to be advanced. Discussed tube feeding intervention with family and addressed any questions/concerns. Pt in CIWA protocol and will continue to monitor electrolyte labs since pt is at refeeding risk.   Patient is currently intubated on ventilator support MV: 9.9 L/min Temp (24hrs), Avg:99.5 F (37.5 C), Min:98.4 F (36.9 C), Max:101.3 F (38.5 C)  MAP (cuff):  Admit weight: 98.6kg  Current weight: 98.6kg   Intake/Output Summary (Last 24 hours) at 10/19/2023 1548 Last data filed at 10/19/2023 1400 Gross per 24 hour  Intake 1382.36 ml  Output 2025 ml  Net -642.64 ml   Net IO Since Admission: -470.1 mL [10/19/23 1548]  Drains/Lines: NG tube R nare (gastric confirmed by xray): 391 mL output Catheter: UOP 2,179mL  Medications reviewed and include:  Folic acid  1mg  daily Novolog  SSI Levothyroxine  MVI w/ minerals Potassium chloride  40mEq Protonix  Phenobarbital  Miralax  Senna Zoloft  Thiamine  100mg   Unasyn   Precedex  35mcg/mL  Labs reviewed:  CBG over last 24: 95-134 mg/dL Sodium 848, Chloride 886 Potassium 3.4  NUTRITION - FOCUSED PHYSICAL EXAM:  Flowsheet Row Most Recent Value  Orbital Region Moderate depletion  Upper Arm Region Moderate depletion  Thoracic and Lumbar Region No depletion  Buccal Region Unable  to assess  Temple Region Moderate depletion  Clavicle Bone Region Mild depletion  Clavicle and Acromion Bone Region Mild depletion  Scapular Bone Region Moderate depletion  Dorsal Hand Unable to assess  [mittens]  Patellar Region Moderate depletion  Anterior Thigh Region Moderate depletion  Posterior Calf Region Moderate depletion  Edema (RD Assessment) None  Hair Reviewed  Eyes Unable to assess  Mouth Reviewed  Skin Reviewed  Nails Unable to assess    Diet Order:   Diet Order             Diet NPO time specified  Diet effective now                  EDUCATION NEEDS:   Not appropriate for education at this time  Skin:  Skin Assessment: Skin Integrity Issues: Skin Integrity Issues:: Diabetic Ulcer Diabetic Ulcer: R toe  Last BM:  PTA  Height:  Ht Readings from Last 1 Encounters:  10/15/23 6' (1.829 m)   Weight:  Wt Readings from Last 1 Encounters:  10/17/23 98.6 kg   Ideal Body Weight:  80.9 kg  BMI:  Body mass index is 29.48 kg/m.  Estimated Nutritional Needs:   Kcal:  2000-2200  Protein:  115-130g  Fluid:  >/= 2L   Josette Glance, MS, RDN, LDN Clinical Dietitian I Please reach out via secure chat

## 2023-10-19 NOTE — Progress Notes (Addendum)
 STROKE TEAM PROGRESS NOTE    SIGNIFICANT HOSPITAL EVENTS  6/19: level 2 trauma after being found on the ground incontinent and shaking.  HR 200s with wide complex QRS, amio bolus given and drip started.  GCS 6. Intubated on arrival.  CT Head shows an acute intraparenchymal hematoma within the right lateral temporal lobe with surrounding vasogenic edema and 5 mm midline shift and scattered subarachnoid hemorrhages.  PTA xarelto  reversed with [facility] on Admission 22. 6/20: Repeat CT head early this a.m. stable. Extubated in afternoon. 6/21: MRI stable IPH, multiple acute/subacute cortical infarcts seen. 3% stopped.  6/23- re intubated  INTERIM HISTORY/SUBJECTIVE Re intubated this morning, CT Head pending  Increased O2 requirements overnight. Febrile 38.4C  OBJECTIVE  CBC    Component Value Date/Time   WBC 5.8 10/19/2023 0718   RBC 4.79 10/19/2023 0718   HGB 14.6 10/19/2023 0718   HGB 17.4 08/12/2023 1622   HCT 45.4 10/19/2023 0718   HCT 53.7 (H) 08/12/2023 1622   PLT 79 (L) 10/19/2023 0718   PLT 127 (L) 08/12/2023 1622   MCV 94.8 10/19/2023 0718   MCV 93 08/12/2023 1622   MCH 30.5 10/19/2023 0718   MCHC 32.2 10/19/2023 0718   RDW 17.2 (H) 10/19/2023 0718   RDW 13.1 08/12/2023 1622   LYMPHSABS 0.9 08/31/2023 1632   LYMPHSABS 0.8 08/04/2022 1459   MONOABS 0.3 08/31/2023 1632   EOSABS 0.0 08/31/2023 1632   EOSABS 0.1 08/04/2022 1459   BASOSABS 0.0 08/31/2023 1632   BASOSABS 0.0 08/04/2022 1459    BMET    Component Value Date/Time   NA 151 (H) 10/19/2023 0718   NA 138 08/31/2023 1632   K 3.4 (L) 10/19/2023 0718   CL 113 (H) 10/19/2023 0718   CO2 26 10/19/2023 0718   GLUCOSE 124 (H) 10/19/2023 0718   BUN 19 10/19/2023 0718   BUN 15 08/31/2023 1632   CREATININE 1.50 (H) 10/19/2023 0718   CALCIUM  8.8 (L) 10/19/2023 0718   EGFR 51 (L) 08/31/2023 1632   GFRNONAA 49 (L) 10/19/2023 0718    IMAGING past 24 hours DG Chest Port 1 View Result Date:  10/19/2023 CLINICAL DATA:  200808 Hypoxia [id number] EXAM: PORTABLE CHEST - 1 VIEW COMPARISON:  10/16/2023 FINDINGS: Relatively low lung volumes. Patchy somewhat ill-defined right perihilar airspace opacities, new since previous. Stable left infrahilar atelectasis/scarring. Heart size and mediastinal contours are within normal limits. Nasogastric tube extends at least as far as the stomach, tip not seen. No definite effusion. Visualized bones unremarkable. IMPRESSION: New right perihilar airspace disease. Electronically Signed   By: JONETTA Faes M.D.   On: 10/19/2023 07:58   DG Abd Portable 1V Result Date: 10/18/2023 CLINICAL DATA:  Encounter for feeding tube placement EXAM: PORTABLE ABDOMEN - 1 VIEW COMPARISON:  None Available. FINDINGS: Feeding tube extends the stomach.  Side port below the GE junction. IMPRESSION: NG tube tip in stomach. Electronically Signed   By: Jackquline Boxer M.D.   On: 10/18/2023 16:46   DG Abd Portable 1V Result Date: 10/18/2023 CLINICAL DATA:  Evaluate feeding tube placement. EXAM: PORTABLE ABDOMEN - 1 VIEW COMPARISON:  10/16/2023 FINDINGS: No enteric tube is identified within the imaged portions of the lower chest and abdomen. Mild gaseous distension of the stomach. Nonobstructive bowel gas pattern. IMPRESSION: 1. No enteric tube is identified within the imaged portions of the lower chest and abdomen. 2. Mild gaseous distension of the stomach. Electronically Signed   By: Waddell Calk M.D.   On: 10/18/2023  11:22    Vitals:   10/19/23 0700 10/19/23 0723 10/19/23 0800 10/19/23 0900  BP: 118/87  119/71 116/78  Pulse: (!) 110  (!) 103 (!) 114  Resp: 17  15 16   Temp: (!) 100.6 F (38.1 C)  (!) 101.1 F (38.4 C) (!) 101.3 F (38.5 C)  TempSrc:   Bladder   SpO2: 90% (S) (!) 77% 100% 100%  Weight:      Height:        PHYSICAL EXAM General: Elderly male, acutely ill CV: paroxysmal Afib on monitor Respiratory: Intubated, mechanically ventilated on full support   NEURO:    Temp:  [98.4 F (36.9 C)-101.3 F (38.5 C)] 101.3 F (38.5 C) (06/23 0900) Pulse Rate:  [80-137] 114 (06/23 0900) Resp:  [15-31] 16 (06/23 0900) BP: (102-153)/(71-105) 116/78 (06/23 0900) SpO2:  [77 %-100 %] 100 % (06/23 0900)  Neuro: Mental Status: Patient had received 4mg  Ativan  within 12 hours of our exam.  Obtunded, does not open eyes, does not follow commands.  Cranial Nerves:  II: PERRL.  III, IV, VI: Bilateral blink to threat with forced eye opening. Does not track.  VII: Flat left nasolabial fold IX & X: Cough and Gag reflexes decreased Motor:  BUE: Localizes to pain, with right arm seemingly stronger than left. BLE: Spontaneous movement seen, antigravity strength Per RN, when patient Is restless he is able to move all 4 extremities with resistance strength,  Tone: is normal and bulk is normal Sensation-withdraws in all 4 extremities Coordination: Not tested Gait- deferred  Most Recent NIH: 21     ASSESSMENT/PLAN  [name] is a 73 y.o. male with  73 y.o. male with hx of anemia, afib on xarleto, anxiety, CKD stage 3, DM without complication, HLD, HTN, Hypothyroidism, PUD presenting as a level 2 trauma after being found on the ground incontinent and shaking. He was given 5mg  of versed  by EMS due to combativeness. He is also on doxycycline  for a toe infection. HR 200s with wide complex QRS, amio bolus given and drip started. GCS 6. Intubated on arrival. CT Head shows an acute intraparenchymal hematoma within the right lateral temporal lobe with surrounding vasogenic edema and 5 mm midline shift and scattered subarachnoid hemorrhages. PTA xarelto  reversed with [facility] on Admission 22.  ICH:  right temporoparietal ICH with scattered SAH, etiology: hypertensive in the setting of DOAC CT Head Acute IPH within right lateral temporal lobe with surrounding vasogenic edema and mass effect, midline shift of approximately 5 mm to left. Scattered subarachnoid  hemorrhage CTA head & neck Negative for spot sign, intracranial aneurysm, or evidence of vascular malformation. Right sided subdural hematoma (estimated at 4 mm) suspected in addition to intra-axial and subarachnoid blood. Repeat CT head 6/20 early AM: overall stable Right temporoparietal parenchymal hemorrhage with surrounding vasogenic edema and mass effect, not significantly changed.  Midline shift is now 6 mm. Scattered subarachnoid hemorrhage, similar to prior. Status post [facility]  reversal MRI  with and without contrast: Stable right temporoparietal IPH and associated vasogenic edema. Mass effect and midline shift similar to prior study, measuring 6 mm. Multiple punctate acute/subacute bilateral cortical infarcts 6/23- CT Head- Stable hematoma, with MLS 7mm 2D Echo: EF 50 to 55%, global hypokinesis, degenerative mitral valve LDL 54 in 08/2023 HgbA1c 5.6 in 08/2023 VTE prophylaxis - SCDs Xarelto  (rivaroxaban ) daily prior to admission, continue No antithrombotic due to ICH Therapy recommendations:  CIR Disposition: Pending  Cerebral Edema Repeat CT 6/23 overall stable hematoma, and slight  increase cerebral edema with MLS 7mm NS consulted, no surgical intervention at this time. 3% bolus of 250 given 6/19 3% hypertonic saline stopped 6/21 -> on NS @ 50 Na 138-134-136-144-150-148-154-151  Seizure like activity Incontinence with shaking at onset per report No seizure since admission LTM EEG no seizure-like activity seen, discontinued 6/20 S/p keppra  load Keppra  for 7 days   Acute Respiratory Failure ?Aspiration Pneumonia Intubated 6/19 d/t mental status, inability to protect airway Extubated 6/20 afternoon -> 6L [place] -> 15L NRB Re intubated 6/23 Tmax 38.4C -> repeat blood cultures WBC 4.6->5.7->5.8 CXR with New right perihilar airspace disease.   Atrial fibrillation Home Meds: Xarelto  20 mg daily Continue telemetry monitoring No anticoagulation now due to ICH  Alcohol  withdrawal Alcohol abuse Intermittent agitation Continue CIWA protocol On precedex   On phenobarbital  taper   Hypertension Home meds: Amlodipine  5 mg daily Stable now BP goal < 160  Hyperlipidemia Home meds: Lipitor  80 mg LDL 54, goal < 70 Consider to resume statin on discharge  Substance Abuse UDS positive for THC       Ready to quit? N/A TOC consult for cessation placed  Dysphagia Patient has post-stroke dysphagia, SLP consulted Now n.p.o. On TF via OG On IVF  Other Stroke Risk Factors Obesity, Body mass index is 29.48 kg/m., BMI >/= 30 associated with increased stroke risk, recommend weight loss, diet and exercise as appropriate Former smoker  Other Active Problems Right foot injury, s/p recent graft and recent infection Was on doxycycline , now Unasyn  WBC 4.7-4.6-5.7-5.8 Repeat blood culture pending CKD3b, creatinine 1.50--1.35--1.58--1.50, on IVF and TF  Hospital day # 4  Patient seen and examined by NP/APP with MD. MD to update note as needed.   Jorene Last, DNP, FNP-BC [facility] Pager: [phone]  ATTENDING NOTE: I reviewed above note and agree with the assessment and plan. Pt was seen and examined.   RNs and Dr. Olalera are at the bedside. Pt still obtunded, not open eyes on voice, not following commands, PERRL, tachypnea, not moving extremities on pain. Neuro unchanged from yesterday. CT showed stable hematoma with slight increased MLS. Continue monitor Na levels. Pt was re-intubated for airway protection. B Cx showed 1/4 steph hemolyticus, concerning for contamination, will repeat blood culture. Pt does have right toe infection, but no evidence of endocarditis so far. On unasyn , IVF and TF.   For detailed assessment and plan, please refer to above as I have made changes wherever appropriate.   Ary Cummins, MD PhD Stroke Neurology 10/19/2023 4:21 PM  This patient is critically ill due to ICH and cerebral edema, A-fib off AC now and  at significant risk of neurological worsening, death form recurrent stroke, hemorrhagic admission, brain herniation, heart failure. This patient's care requires constant monitoring of vital signs, hemodynamics, respiratory and cardiac monitoring, review of multiple databases, neurological assessment, discussion with family, other specialists and medical decision making of high complexity. I spent 30 minutes of neurocritical care time in the care of this patient. Discussed with Dr. Neda CCM

## 2023-10-19 NOTE — Progress Notes (Addendum)
 NAME:  Jonathon [name], MRN:  [id number], DOB:  04-01-1951, LOS: 4 ADMISSION DATE:  10/15/2023, CONSULTATION DATE: 10/15/2023 REFERRING MD: EDP, CHIEF COMPLAINT: Intracerebral hemorrhage  History of Present Illness:  73 year old man who was found down by wife poorly responsive required intubation and transportation to the emergency.  CT scan revealed a right intraparenchymal hemorrhage with other areas of injury.  He is currently intubated in atrial fibrillation ventricular response on amiodarone  drip being treated with fentanyl  and propofol  for tube tolerance.  He has been seen by neurosurgery and neurology.  Hyperatremia treatment we await the final results of CT scan of the head to see if there is any surgical options.  Pertinent Medical History:   Past Medical History:  Diagnosis Date   Anemia due to blood loss, acute 2014   2nd GIB   Anxiety    CKD (chronic kidney disease), stage III (HCC) 2014   Diabetes mellitus without complication (HCC)    Gout    High cholesterol    Hypertension 2014   Hypothyroid    Neuropathy    PUD (peptic ulcer disease) 2014    Significant Hospital Events: Including procedures, antibiotic start and stop dates in addition to other pertinent events   6/19 Admitted w/ ICH felt 2/2 HTN and c/b SDH p/ fall. Started AEDs. HTNS. Seen by n surg felt unlikely that surg would improve outcome. Seen by neuro. Placed on clevi. Got AC reversed in ER. Unasyn  started for asp 6/20 weaning CT imaging stable ECHO ordered. 1/2 BC + felt contamination but does bring endocarditis into question. Extubated with increased agitation post-extubation, placed on precedex . Patient does drink alcohol every night, placed on CIWA 6/21 Echo with EF 50-55%, low normal LV function +global hypokinesis, degenerative MV without MR, no evidence of valvular vegetations.  Interim History / Subjective:  Agitated overnight per RN Ongoing low grade fevers Early AM 6/23, patient noted to have  short period of respiratory decompensation Sats reportedly low 90s, [place] titrated up; sats as low as 70s (?pleth) and NRB applied Obtained CXR, new R-sided infiltrates c/f aspiration D/w family (wife, daughter) at bedside Plan for reintubation for airway protection + allowing for repeat imaging  Objective:   Blood pressure 118/87, pulse (!) 110, temperature (!) 100.6 F (38.1 C), resp. rate 17, height 6' (1.829 m), weight 98.6 kg, SpO2 90%.        Intake/Output Summary (Last 24 hours) at 10/19/2023 0814 Last data filed at 10/19/2023 9292 Gross per 24 hour  Intake 1537.85 ml  Output 2120 ml  Net -582.15 ml   Filed Weights   10/15/23 0944 10/17/23 0500  Weight: 97.4 kg 98.6 kg   Physical Examination: General: Acutely ill-appearing older man in NAD. HEENT: [place]/AT, anicteric sclera, PERRL 5mm, dry mucous membranes. Neuro: Lethargic. Does not respond to verbal, tactile or noxious stimuli. Not following commands. No spontaneous movement of extremities on exam. No gag, weak cough.  CV: Mildly tachycardic to 100s with +ectopy, no m/g/r. PULM: Breathing even and mildly labored/at times paradoxical on NRB. Lung fields diminished throughout, R > L. Dry oropharynx. GI: Soft, nontender, nondistended. Normoactive bowel sounds. Extremities: Trace symmetric BLE edema noted. Previous R great toe amputation. ?Ulcerated R second toe with eschar. Skin: Warm/dry, R second toe as above.  Resolved problem List:   Assessment and Plan:  R intraparenchymal hemorrhage involving right temporal lobe with vasogenic edema, (R) SDH AND also adjacent scattered subarachnoid hemorrhages primarily within the adjacent cerebral area.  Felt primary injury d/t  HTN on DOAC. DOAC reversed in ED. GCS 6 on arrival. CT Head 6/20 with multifocal ICH (stable since 6/19), mixed density R hemisphere intraaxial hematoma, R-sided SDH, scattered bilateral SAH, stable intracranial mass effect with 6-49mm R to L midline shift. MRI Brain  6/21 with stable R temporoparietal IPH with associated vasogenic edema/mass effect and shift (6mm), multiple punctate bilateral infarcts. LTM EEG negative for seizures. - NSGY/Stroke teams following, appreciate recommendations - Per NSGY, no role for surgical intervention at this juncture - Goal SBP < 160 - AEDs per Neuro - Frequent neurochecks - Neuroprotective measures: HOB > 30 degrees, normoglycemia, normothermia, electrolytes WNL - PT/OT/SLP as able  Acute Hypoxemic Respiratory Failure Ventilator management s/p ICH and ineffective airway clearance Concern for aspiration PNA Extubated 6/20. - Concerned patient is not protecting his airway, periods of desaturations noted and ongoing poor responsiveness, gurgling respirations and at time paradoxical breathing - Plan for reintubation today, 6/23 - Continue full vent support (4-8cc/kg IBW) - Wean FiO2 for O2 sat > 90% - Daily WUA/SBT - VAP bundle - Pulmonary hygiene - PAD protocol for sedation: Precedex  and Fentanyl  for goal RASS -1 to -2, could consider Prop in place of Precedex  if needed - Unasyn  for aspiration coverage - Follow CXR  Acute Metabolic Encephalopathy Multifactorial in the setting of ICH/SDH, ?alcohol withdrawal. - Correct metabolic derangements as able - Avoid sedating agents, though this is difficult in the setting of above - PAD protocol as above  Alcohol Withdrawal Patient has multiple liquor drinks nightly. - Monitor for signs/symptoms of withdrawal - Precedex , wean as able - Phenobarbital  taper - CIWA protocol with Ativan  - Continue thiamine /folate - MV when able to tolerate PO  Ectopy and tachycardia History of Afib (on Xarelto ) Previously required amiodarone  gtt. - Cardiac monitoring - Optimize electrolytes for K > 4, Mg > 2 - AC held in the setting of multiple areas of hemorrhage - Low threshold to resume amiodarone  if ongoing arrhythmias/ectopy  Possible bacteremia, r/o endocarditis 1/2 BC +  staph organism (not SA), likely contamination. Echo 6/21 with EF 50-55%, low normal LV function +global hypokinesis, degenerative MV without MR, no evidence of valvular vegetations - Trend WBC, fever curve - F/u Cx data - Continue broad-spectrum antibiotics (Unasyn ) - Consider TEE  Poorly controlled hypertension - Goal SBP < 160 - Hydralazine , labetalol  PRN  Diabetes mellitus with poor control - SSI - CBGs Q4H - Goal CBG 140-180  CKD Stage 3b - GFR 30 to 44 Fluid and electrolyte imbalance: hypomagnesemia and hypophosphatemia  - Trend BMP - Replete electrolytes as indicated - Monitor I&Os - Avoid nephrotoxic agents as able - Ensure adequate renal perfusion  Peripheral vascular disease with neuropathy and loss of right great toe fourth toe as well as debridement and recent skin graft of 2nd toe on right foot - MRI of R foot, r/o osteomyelitis when stable for scan  Best Practice (right click and Reselect all SmartList Selections daily)   Diet/type: NPO DVT prophylaxis  Pressure ulcer(s): present on admission  GI prophylaxis: PPI Lines: N/A Foley:  N/A Code Status:  full code Last date of multidisciplinary goals of care discussion: [Bedside 6/23 with family, plan for reintubation today]  Critical care time:   The patient is critically ill with multiple organ system failure and requires high complexity decision making for assessment and support, frequent evaluation and titration of therapies, advanced monitoring, review of radiographic studies and interpretation of complex data.   Critical Care Time devoted to patient care services, exclusive  of separately billable procedures, described in this note is 38 minutes.  Corean CHRISTELLA Caillou Minus, PA-C [facility] 10/19/23 8:15 AM  Please see [email or web address] for pager details.  From 7A-7P if no response, please call [phone] After hours, please call ELink [phone]

## 2023-10-19 NOTE — Progress Notes (Signed)
 PT Cancellation Note  Patient Details Name: Jonathon [name] MRN: [id number] DOB: 1951/04/21   Cancelled Treatment:    Reason Eval/Treat Not Completed: Medical issues which prohibited therapy today as pt with decompensated respiratory status this morning, planning for re-intubation today. Will hold on PT session for today and re-evaluate as appropriate.  [name], PT, DPT   Acute Rehabilitation Department Office [phone] Secure Chat Communication Preferred   [name] 10/19/2023, 9:33 AM

## 2023-10-20 ENCOUNTER — Inpatient Hospital Stay (HOSPITAL_COMMUNITY)

## 2023-10-20 DIAGNOSIS — G936 Cerebral edema: Secondary | ICD-10-CM | POA: Diagnosis not present

## 2023-10-20 DIAGNOSIS — R29721 NIHSS score 21: Secondary | ICD-10-CM | POA: Diagnosis not present

## 2023-10-20 DIAGNOSIS — I611 Nontraumatic intracerebral hemorrhage in hemisphere, cortical: Secondary | ICD-10-CM | POA: Diagnosis not present

## 2023-10-20 DIAGNOSIS — J9601 Acute respiratory failure with hypoxia: Secondary | ICD-10-CM

## 2023-10-20 DIAGNOSIS — I609 Nontraumatic subarachnoid hemorrhage, unspecified: Secondary | ICD-10-CM | POA: Diagnosis not present

## 2023-10-20 LAB — CBC
HCT: 39.7 % (ref 39.0–52.0)
Hemoglobin: 13.1 g/dL (ref 13.0–17.0)
MCH: 30.5 pg (ref 26.0–34.0)
MCHC: 33 g/dL (ref 30.0–36.0)
MCV: 92.5 fL (ref 80.0–100.0)
Platelets: 68 10*3/uL — ABNORMAL LOW (ref 150–400)
RBC: 4.29 MIL/uL (ref 4.22–5.81)
RDW: 17.2 % — ABNORMAL HIGH (ref 11.5–15.5)
WBC: 2.7 10*3/uL — ABNORMAL LOW (ref 4.0–10.5)
nRBC: 0 % (ref 0.0–0.2)

## 2023-10-20 LAB — BASIC METABOLIC PANEL WITH GFR
Anion gap: 8 (ref 5–15)
BUN: 24 mg/dL — ABNORMAL HIGH (ref 8–23)
CO2: 25 mmol/L (ref 22–32)
Calcium: 8.4 mg/dL — ABNORMAL LOW (ref 8.9–10.3)
Chloride: 116 mmol/L — ABNORMAL HIGH (ref 98–111)
Creatinine, Ser: 1.38 mg/dL — ABNORMAL HIGH (ref 0.61–1.24)
GFR, Estimated: 54 mL/min — ABNORMAL LOW (ref >60)
Glucose, Bld: 186 mg/dL — ABNORMAL HIGH (ref 70–99)
Potassium: 2.7 mmol/L — CL (ref 3.5–5.1)
Sodium: 149 mmol/L — ABNORMAL HIGH (ref 135–145)

## 2023-10-20 LAB — GLUCOSE, CAPILLARY
Glucose-Capillary: 169 mg/dL — ABNORMAL HIGH (ref 70–99)
Glucose-Capillary: 170 mg/dL — ABNORMAL HIGH (ref 70–99)
Glucose-Capillary: 171 mg/dL — ABNORMAL HIGH (ref 70–99)
Glucose-Capillary: 182 mg/dL — ABNORMAL HIGH (ref 70–99)
Glucose-Capillary: 196 mg/dL — ABNORMAL HIGH (ref 70–99)
Glucose-Capillary: 219 mg/dL — ABNORMAL HIGH (ref 70–99)

## 2023-10-20 LAB — CULTURE, BLOOD (ROUTINE X 2)
Culture: NO GROWTH
Special Requests: ADEQUATE

## 2023-10-20 LAB — PHOSPHORUS: Phosphorus: 1.5 mg/dL — ABNORMAL LOW (ref 2.5–4.6)

## 2023-10-20 LAB — MAGNESIUM: Magnesium: 1.5 mg/dL — ABNORMAL LOW (ref 1.7–2.4)

## 2023-10-20 MED ORDER — LEVETIRACETAM 100 MG/ML PO SOLN
500.0000 mg | Freq: Two times a day (BID) | ORAL | Status: AC
  Administered 2023-10-20 – 2023-10-21 (×4): 500 mg
  Filled 2023-10-20 (×4): qty 5

## 2023-10-20 MED ORDER — POTASSIUM CHLORIDE 20 MEQ PO PACK
40.0000 meq | PACK | ORAL | Status: AC
  Administered 2023-10-20 (×2): 40 meq
  Filled 2023-10-20 (×2): qty 2

## 2023-10-20 MED ORDER — MAGNESIUM SULFATE 2 GM/50ML IV SOLN
2.0000 g | Freq: Once | INTRAVENOUS | Status: AC
  Administered 2023-10-20: 2 g via INTRAVENOUS
  Filled 2023-10-20: qty 50

## 2023-10-20 MED ORDER — PHENOBARBITAL 32.4 MG PO TABS
32.4000 mg | ORAL_TABLET | Freq: Three times a day (TID) | ORAL | Status: DC
  Administered 2023-10-20: 32.4 mg
  Filled 2023-10-20: qty 1

## 2023-10-20 MED ORDER — PANTOPRAZOLE SODIUM 40 MG IV SOLR: 40.0000 mg | Freq: Every day | INTRAVENOUS | Status: DC

## 2023-10-20 MED ORDER — GADOBUTROL 1 MMOL/ML IV SOLN
9.0000 mL | Freq: Once | INTRAVENOUS | Status: AC | PRN
  Administered 2023-10-20: 9 mL via INTRAVENOUS

## 2023-10-20 MED ORDER — POTASSIUM PHOSPHATES 15 MMOLE/5ML IV SOLN
30.0000 mmol | Freq: Once | INTRAVENOUS | Status: AC
  Administered 2023-10-20: 30 mmol via INTRAVENOUS
  Filled 2023-10-20: qty 10

## 2023-10-20 NOTE — Progress Notes (Signed)
Patient transported to MRI and back without complications. RN at bedside. 

## 2023-10-20 NOTE — Progress Notes (Signed)
 PT Cancellation Note  Patient Details Name: Jonathon [name] MRN: [id number] DOB: 1950-08-10   Cancelled Treatment:    Reason Eval/Treat Not Completed: Medical issues which prohibited therapy remain today, pt remains intubated and sedated. Per discussion with RN, plan for MRI today, holding PT until more able to participate. Will continue to follow and check in.   [name], PT, DPT   Acute Rehabilitation Department Office [phone] Secure Chat Communication Preferred   [name] 10/20/2023, 8:08 AM

## 2023-10-20 NOTE — Progress Notes (Signed)
 NAME:  Jonathon [name], MRN:  [id number], DOB:  11-28-1950, LOS: 5 ADMISSION DATE:  10/15/2023, CONSULTATION DATE: 10/15/2023 REFERRING MD: EDP, CHIEF COMPLAINT: Intracerebral hemorrhage  History of Present Illness:  73 year old man who was found down by wife poorly responsive required intubation and transportation to the emergency.  CT scan revealed a right intraparenchymal hemorrhage with other areas of injury.  He is currently intubated in atrial fibrillation ventricular response on amiodarone  drip being treated with fentanyl  and propofol  for tube tolerance.  He has been seen by neurosurgery and neurology.  Hyperatremia treatment we await the final results of CT scan of the head to see if there is any surgical options.  Pertinent Medical History:   Past Medical History:  Diagnosis Date   Anemia due to blood loss, acute 2014   2nd GIB   Anxiety    CKD (chronic kidney disease), stage III (HCC) 2014   Diabetes mellitus without complication (HCC)    Gout    High cholesterol    Hypertension 2014   Hypothyroid    Neuropathy    PUD (peptic ulcer disease) 2014    Significant Hospital Events: Including procedures, antibiotic start and stop dates in addition to other pertinent events   6/19 Admitted w/ ICH felt 2/2 HTN and c/b SDH p/ fall. Started AEDs. HTNS. Seen by n surg felt unlikely that surg would improve outcome. Seen by neuro. Placed on clevi. Got AC reversed in ER. Unasyn  started for asp 6/20 weaning CT imaging stable ECHO ordered. 1/2 BC + felt contamination but does bring endocarditis into question. Extubated with increased agitation post-extubation, placed on precedex . Patient does drink alcohol every night, placed on CIWA 6/21 Echo with EF 50-55%, low normal LV function +global hypokinesis, degenerative MV without MR, no evidence of valvular vegetations. 6/23: reintubated after poor responsiveness, gurgling and desaturations 6/24: MRI today for R foot osteo r/o   Interim  History / Subjective:  NAEON. Intubated yesterday. Going down for MRI R toe this morning for R/o osteomyelitis. Repleting elytes. Repeating CXR this morning. Added tracheal aspirate to help with antibiotic decision. Plan to stop Unasyn  tomorrow after 7 day course.  Objective:   Blood pressure (!) 138/93, pulse 89, temperature (!) 96.8 F (36 C), resp. rate 16, height 6' (1.829 m), weight 97.2 kg, SpO2 100%.    Vent Mode: PRVC FiO2 (%):  [40 %-60 %] 40 % Set Rate:  [16 bmp] 16 bmp Vt Set:  [620 mL] 620 mL PEEP:  [5 cmH20] 5 cmH20 Plateau Pressure:  [18 cmH20] 18 cmH20   Intake/Output Summary (Last 24 hours) at 10/20/2023 0919 Last data filed at 10/20/2023 9277 Gross per 24 hour  Intake 3022.23 ml  Output 780 ml  Net 2242.23 ml   Filed Weights   10/15/23 0944 10/17/23 0500 10/20/23 0500  Weight: 97.4 kg 98.6 kg 97.2 kg   Physical Examination: General: Acutely ill-appearing older man, intubated, sedated  HEENT: Glide/AT, anicteric sclera, PERRL4MM, dry mucous membranes. Neuro: Sedated. Does not respond to verbal, tactile or noxious stimuli. Not following commands. No spontaneous movement of extremities on exam. No gag, weak cough.  CV: rrr, no m/r/g PULM: rhonchi bilaterally, vented, minimal settings GI: Soft, nontender, nondistended. Normoactive bowel sounds. Extremities: trace edema; right 2nd toe swollen, hot with eschar, no surrounding erythema  Resolved problem List:   Assessment and Plan:  R intraparenchymal hemorrhage involving right temporal lobe with vasogenic edema, (R) SDH AND also adjacent scattered subarachnoid hemorrhages primarily within the adjacent cerebral area.  Felt primary injury d/t HTN on DOAC. DOAC reversed in ED. GCS 6 on arrival. CT Head 6/20 with multifocal ICH (stable since 6/19), mixed density R hemisphere intraaxial hematoma, R-sided SDH, scattered bilateral SAH, stable intracranial mass effect with 6-52mm R to L midline shift. MRI Brain 6/21 with stable R  temporoparietal IPH with associated vasogenic edema/mass effect and shift (6mm), multiple punctate bilateral infarcts. LTM EEG negative for seizures. - NSGY/Stroke teams following, appreciate recommendations - Per NSGY, no role for surgical intervention at this juncture - Goal SBP < 160 - AEDs per Neuro - Frequent neurochecks - Neuroprotective measures: HOB > 30 degrees, normoglycemia, normothermia, electrolytes WNL - PT/OT/SLP as able  Acute Hypoxemic Respiratory Failure Ventilator management s/p ICH and ineffective airway clearance Concern for aspiration PNA Extubated 6/20; reintubated 6/23 - send tracheal aspirate  - unasyn  stop 6/25  - full mechanical vent support, lung protective ventilation 6-8cc/kg Vt - VAP and PAD bundle in place  - titrate FiO2 to sat goal >92  - maintain peak/plats <30, driving pressures <84    Acute Metabolic Encephalopathy Multifactorial in the setting of ICH/SDH, ?alcohol withdrawal. - Correct metabolic derangements as able - Avoid sedating agents, though this is difficult in the setting of above - PAD protocol as above  Alcohol Withdrawal Patient has multiple liquor drinks nightly. - Monitor for signs/symptoms of withdrawal - Precedex , turn off when back from MRI  - Phenobarbital  taper - neurology requested quicker taper  - CIWA protocol with Ativan  - d/c'd haldol  with Qtc prolongation - Continue thiamine /folate - MV when able to tolerate PO  Hypokalemia Hypomagnesemia Hypophosphatemia - replete, trend  Ectopy and tachycardia History of Afib (on Xarelto ) Previously required amiodarone  gtt. - Cardiac monitoring - Optimize electrolytes for K > 4, Mg > 2 - AC held in the setting of multiple areas of hemorrhage - Low threshold to resume amiodarone  if ongoing arrhythmias/ectopy  Possible bacteremia, r/o endocarditis 1/2 BC + staph organism (not SA), likely contamination. Echo 6/21 with EF 50-55%, low normal LV function +global hypokinesis,  degenerative MV without MR, no evidence of valvular vegetations - Trend WBC, fever curve - BCX 6/23 negative, f/u trach aspirate  - Continue broad-spectrum antibiotics (Unasyn ) - stop tomorrow - do not feel he needs TEE at this time  Poorly controlled hypertension - Goal SBP < 160 - Hydralazine , labetalol  PRN  Diabetes mellitus with poor control - SSI - CBGs Q4H - Goal CBG 140-180  CKD Stage 3b - GFR 30 to 44 Fluid and electrolyte imbalance: hypomagnesemia and hypophosphatemia  - Trend BMP - Replete electrolytes as indicated - Monitor I&Os - Avoid nephrotoxic agents as able - Ensure adequate renal perfusion  Peripheral vascular disease with neuropathy and loss of right great toe fourth toe as well as debridement and recent skin graft of 2nd toe on right foot - MRI of R foot 6/24 AM  Best Practice (right click and Reselect all SmartList Selections daily)   Diet/type: tubefeeds and NPO DVT prophylaxis: SCD  Pressure ulcer(s): present on admission  GI prophylaxis: PPI Lines: N/A Foley:  Yes, and it is still needed Code Status:  full code Last date of multidisciplinary goals of care discussion: [Bedside 6/24 with family]  Critical care time:   The patient is critically ill with multiple organ system failure and requires high complexity decision making for assessment and support, frequent evaluation and titration of therapies, advanced monitoring, review of radiographic studies and interpretation of complex data.   Critical Care Time devoted to  patient care services, exclusive of separately billable procedures, described in this note is 35 minutes.  Tinnie FORBES Furth, PA-C [facility] 10/20/23 9:19 AM  Please see [email or web address] for pager details.  From 7A-7P if no response, please call [phone] After hours, please call ELink [phone]

## 2023-10-20 NOTE — Progress Notes (Addendum)
 STROKE TEAM PROGRESS NOTE    SIGNIFICANT HOSPITAL EVENTS  6/19: level 2 trauma after being found on the ground incontinent and shaking.  HR 200s with wide complex QRS, amio bolus given and drip started.  GCS 6. Intubated on arrival.  CT Head shows an acute intraparenchymal hematoma within the right lateral temporal lobe with surrounding vasogenic edema and 5 mm midline shift and scattered subarachnoid hemorrhages.  PTA xarelto  reversed with [facility] on Admission 22. 6/20: Repeat CT head early this a.m. stable. Extubated in afternoon. 6/21: MRI stable IPH, multiple acute/subacute cortical infarcts seen. 3% stopped.  6/23- re intubated  INTERIM HISTORY/SUBJECTIVE Tmax for 24 hours 38.1 K 2.7 -> replaced MRI foot today  Still on phenobarbital  taper Intubated yesterday- sedated with precedex , no spontaneous movement. Absent cough and gag, corneal  intact R>L. Discontinue phenobarbital  today   OBJECTIVE  CBC    Component Value Date/Time   WBC 2.7 (L) 10/20/2023 0559   RBC 4.29 10/20/2023 0559   HGB 13.1 10/20/2023 0559   HGB 17.4 08/12/2023 1622   HCT 39.7 10/20/2023 0559   HCT 53.7 (H) 08/12/2023 1622   PLT 68 (L) 10/20/2023 0559   PLT 127 (L) 08/12/2023 1622   MCV 92.5 10/20/2023 0559   MCV 93 08/12/2023 1622   MCH 30.5 10/20/2023 0559   MCHC 33.0 10/20/2023 0559   RDW 17.2 (H) 10/20/2023 0559   RDW 13.1 08/12/2023 1622   LYMPHSABS 0.9 08/31/2023 1632   LYMPHSABS 0.8 08/04/2022 1459   MONOABS 0.3 08/31/2023 1632   EOSABS 0.0 08/31/2023 1632   EOSABS 0.1 08/04/2022 1459   BASOSABS 0.0 08/31/2023 1632   BASOSABS 0.0 08/04/2022 1459    BMET    Component Value Date/Time   NA 149 (H) 10/20/2023 0559   NA 138 08/31/2023 1632   K 2.7 (LL) 10/20/2023 0559   CL 116 (H) 10/20/2023 0559   CO2 25 10/20/2023 0559   GLUCOSE 186 (H) 10/20/2023 0559   BUN 24 (H) 10/20/2023 0559   BUN 15 08/31/2023 1632   CREATININE 1.38 (H) 10/20/2023 0559   CALCIUM  8.4 (L) 10/20/2023  0559   EGFR 51 (L) 08/31/2023 1632   GFRNONAA 54 (L) 10/20/2023 0559    IMAGING past 24 hours CT HEAD WO CONTRAST ( ) Result Date: 10/19/2023 CLINICAL DATA:  Stroke, follow up. Follow-up intracranial hemorrhage. EXAM: CT HEAD WITHOUT CONTRAST TECHNIQUE: Contiguous axial images were obtained from the base of the skull through the vertex without intravenous contrast. RADIATION DOSE REDUCTION: This exam was performed according to the departmental dose-optimization program which includes automated exposure control, adjustment of the mA and/or kV according to patient size and/or use of iterative reconstruction technique. COMPARISON:  Head CT 10/16/2023 and MRI 10/17/2023 FINDINGS: Brain: A large parenchymal hematoma in the right temporal lobe has not significantly changed in size, measuring approximately 3.0 x 3.0 x 8.4 cm including the dominant hyperdense component as well as the anteriorly located, non dependent hypodense component. Mild-to-moderate surrounding vasogenic edema and associated mass effect are stable to at most minimally increased from the prior studies with leftward midline shift measuring 7 mm (previously 6 mm). Scattered small volume subarachnoid hemorrhage involving right greater than left cerebral sulci has not significantly progressed. No acute large territory infarct or hydrocephalus is evident. A thin subdural hematoma over the right cerebral convexity is unchanged, measuring up to 4 mm in thickness. There is mild cerebral atrophy. Vascular: No hyperdense vessel. Skull: No acute fracture or suspicious lesion. Sinuses/Orbits: A large  cyst in the right maxillary sinus associated with an unerupted molar tooth is again noted. Mild scattered paranasal sinus mucosal thickening is noted elsewhere. There are persistent right larger than left mastoid effusions and right middle ear fluid. Bilateral cataract extraction. Other: None. IMPRESSION: 1. Unchanged large right temporal lobe parenchymal  hematoma with stable to minimally increased surrounding edema and mass effect. Leftward midline shift of 7 mm. 2. Unchanged small volume subarachnoid hemorrhage and thin right subdural hematoma. Electronically Signed   By: Dasie Hamburg M.D.   On: 10/19/2023 13:16   Portable Chest x-ray Result Date: 10/19/2023 CLINICAL DATA:  [id number] Endotracheal tube present [id number] EXAM: PORTABLE CHEST - 1 VIEW COMPARISON:  Earlier film of the same day FINDINGS: Endotracheal tube has been placed, tip 4.4 cm above carina. Gastric tube extends at least as far as the stomach, tip not seen. Patchy perihilar and infrahilar opacities stable. No new infiltrate. Heart size and mediastinal contours are within normal limits. Aortic Atherosclerosis (ICD10-170.0). No effusion. Visualized bones unremarkable. IMPRESSION: 1. Endotracheal tube 4.4 cm above carina. 2. Stable perihilar and infrahilar opacities. Electronically Signed   By: JONETTA Faes M.D.   On: 10/19/2023 11:09    Vitals:   10/20/23 0600 10/20/23 0630 10/20/23 0700 10/20/23 0809  BP: 130/81 (!) 150/106 (!) 138/93   Pulse: 68 75 80 89  Resp: 16 16 16 16   Temp: (!) 96.6 F (35.9 C) (!) 96.6 F (35.9 C) (!) 96.6 F (35.9 C) (!) 96.8 F (36 C)  TempSrc:      SpO2: 98% 100% 99% 100%  Weight:      Height:        PHYSICAL EXAM General: Elderly male, acutely ill CV: paroxysmal Afib on monitor Respiratory: Intubated, mechanically ventilated on full support   NEURO:   Temp:  [96.6 F (35.9 C)-101.3 F (38.5 C)] 96.8 F (36 C) (06/24 0809) Pulse Rate:  [68-122] 89 (06/24 0809) Resp:  [13-17] 16 (06/24 0809) BP: (88-150)/(54-106) 138/93 (06/24 0700) SpO2:  [96 %-100 %] 100 % (06/24 0809) FiO2 (%):  [40 %-60 %] 40 % (06/24 0810) Weight:  [97.2 kg] 97.2 kg (06/24 0500)  Neuro: Mental Status: Intubated, on precedex    Cranial Nerves:  II: PERRL.  III, IV, VI: Does not track. Corneal R>L VII: Flat left nasolabial fold IX & X: Cough and Gag reflexes  absent Motor:  No spontaneous movement noted Tone: is normal and bulk is normal Sensation-no withdrawal to pain at this time  Coordination: Not tested Gait- deferred  Most Recent NIH: 21     ASSESSMENT/PLAN  [name] is a 73 y.o. male with  73 y.o. male with hx of anemia, afib on xarleto, anxiety, CKD stage 3, DM without complication, HLD, HTN, Hypothyroidism, PUD presenting as a level 2 trauma after being found on the ground incontinent and shaking. He was given 5mg  of versed  by EMS due to combativeness. He is also on doxycycline  for a toe infection. HR 200s with wide complex QRS, amio bolus given and drip started. GCS 6. Intubated on arrival. CT Head shows an acute intraparenchymal hematoma within the right lateral temporal lobe with surrounding vasogenic edema and 5 mm midline shift and scattered subarachnoid hemorrhages. PTA xarelto  reversed with [facility] on Admission 22.  ICH:  right temporoparietal ICH with scattered SAH, etiology: hypertensive in the setting of DOAC CT Head Acute IPH within right lateral temporal lobe with surrounding vasogenic edema and mass effect, midline shift of approximately 5 mm  to left. Scattered subarachnoid hemorrhage CTA head & neck Negative for spot sign, intracranial aneurysm, or evidence of vascular malformation. Right sided subdural hematoma (estimated at 4 mm) suspected in addition to intra-axial and subarachnoid blood. Repeat CT head 6/20 early AM: overall stable Right temporoparietal parenchymal hemorrhage with surrounding vasogenic edema and mass effect, not significantly changed.  Midline shift is now 6 mm. Scattered subarachnoid hemorrhage, similar to prior. Status post [facility]  reversal MRI  with and without contrast: Stable right temporoparietal IPH and associated vasogenic edema. Mass effect and midline shift similar to prior study, measuring 6 mm. Multiple punctate acute/subacute bilateral cortical infarcts 6/23- CT Head- Stable  hematoma, with MLS 7mm 2D Echo: EF 50 to 55%, global hypokinesis, degenerative mitral valve LDL 54 in 08/2023 HgbA1c 5.6 in 08/2023 VTE prophylaxis - SCDs Xarelto  (rivaroxaban ) daily prior to admission, continue No antithrombotic due to ICH Therapy recommendations:  CIR Disposition: Pending  Cerebral Edema Repeat CT 6/23 overall stable hematoma, and slight increase cerebral edema with MLS 7mm NS consulted, no surgical intervention at this time. 3% bolus of 250 given 6/19 3% hypertonic saline stopped 6/21 -> on NS @ 50-> off Na 138-134-136-144-150-148-154-151 -149  Seizure like activity Incontinence with shaking at onset per report No seizure since admission LTM EEG no seizure-like activity seen, discontinued 6/20 S/p keppra  load Keppra  for 7 days   Acute Respiratory Failure ?Aspiration Pneumonia Intubated 6/19 d/t mental status, inability to protect airway Extubated 6/20 afternoon -> 6L [place] -> 15L NRB->re-intubated 6/23 Tmax 38.4C -> repeat blood cultures with no growth WBC 4.6->5.7->5.8->2.7 CXR with New right perihilar airspace disease.   Atrial fibrillation Home Meds: Xarelto  20 mg daily Continue telemetry monitoring No anticoagulation now due to ICH  Alcohol withdrawal Alcohol abuse Intermittent agitation Continue CIWA protocol On precedex   On phenobarbital  taper - discontinue today given mental status  Hypertension Home meds: Amlodipine  5 mg daily Stable now BP goal < 160  Hyperlipidemia Home meds: Lipitor  80 mg LDL 54, goal < 70 Consider to resume statin on discharge  Substance Abuse UDS positive for THC       Ready to quit? N/A TOC consult for cessation placed  Dysphagia Patient has post-stroke dysphagia, SLP consulted Now n.p.o. On TF @ 55  Other Stroke Risk Factors Obesity, Body mass index is 29.06 kg/m., BMI >/= 30 associated with increased stroke risk, recommend weight loss, diet and exercise as appropriate Former smoker  Other Active  Problems Right foot injury, s/p recent graft and recent infection Was on doxycycline , now Unasyn  WBC 4.7-4.6-5.7-5.8 MRI right foot pending Repeat blood culture pending CKD3b, creatinine 1.50--1.35--1.58--1.50--1.38, on TF Hypokalemia - 2.7 -> replaced   Hospital day # 5  Patient seen and examined by NP/APP with MD. MD to update note as needed.   Jorene Last, DNP, FNP-BC [facility] Pager: [phone]  ATTENDING NOTE: I reviewed above note and agree with the assessment and plan. Pt was seen and examined.   Wife and daughter are at the bedside. Pt still intubated on vent and precedex . Pt still obunded and not open eyes or following commands or moving extremities on pain. Yesterday CT stable. Will discontinue penobarbital taper, taper down precedex  to see if helping for mental status. B Cx so far NGTD, pending MRI right foot to rule out osteomyelitis. On TF now, Na trending down, low K, need supplement.   For detailed assessment and plan, please refer to above as I have made changes wherever appropriate.   Ary Cummins, MD PhD  Stroke Neurology 10/20/2023 3:15 PM  This patient is critically ill due to ICH and cerebral edema, A-fib off AC now and at significant risk of neurological worsening, death form recurrent stroke, hemorrhagic admission, brain herniation, heart failure. This patient's care requires constant monitoring of vital signs, hemodynamics, respiratory and cardiac monitoring, review of multiple databases, neurological assessment, discussion with family, other specialists and medical decision making of high complexity. I spent 30 minutes of neurocritical care time in the care of this patient. Discussed with NP CCM

## 2023-10-20 NOTE — Plan of Care (Signed)
 Patient tolerated vent settings with the sedation ordered overnight. Temperature was variable.

## 2023-10-21 ENCOUNTER — Inpatient Hospital Stay (HOSPITAL_COMMUNITY)

## 2023-10-21 ENCOUNTER — Encounter (HOSPITAL_COMMUNITY): Payer: Self-pay | Admitting: Pulmonary Disease

## 2023-10-21 DIAGNOSIS — F10930 Alcohol use, unspecified with withdrawal, uncomplicated: Secondary | ICD-10-CM | POA: Diagnosis not present

## 2023-10-21 DIAGNOSIS — M869 Osteomyelitis, unspecified: Secondary | ICD-10-CM | POA: Diagnosis not present

## 2023-10-21 DIAGNOSIS — I611 Nontraumatic intracerebral hemorrhage in hemisphere, cortical: Secondary | ICD-10-CM | POA: Diagnosis not present

## 2023-10-21 DIAGNOSIS — S065XAA Traumatic subdural hemorrhage with loss of consciousness status unknown, initial encounter: Secondary | ICD-10-CM | POA: Diagnosis not present

## 2023-10-21 DIAGNOSIS — R29721 NIHSS score 21: Secondary | ICD-10-CM | POA: Diagnosis not present

## 2023-10-21 DIAGNOSIS — G936 Cerebral edema: Secondary | ICD-10-CM | POA: Diagnosis not present

## 2023-10-21 DIAGNOSIS — I609 Nontraumatic subarachnoid hemorrhage, unspecified: Secondary | ICD-10-CM | POA: Diagnosis not present

## 2023-10-21 DIAGNOSIS — E440 Moderate protein-calorie malnutrition: Secondary | ICD-10-CM

## 2023-10-21 DIAGNOSIS — J9601 Acute respiratory failure with hypoxia: Secondary | ICD-10-CM | POA: Diagnosis not present

## 2023-10-21 LAB — BASIC METABOLIC PANEL WITH GFR
Anion gap: 9 (ref 5–15)
BUN: 25 mg/dL — ABNORMAL HIGH (ref 8–23)
CO2: 26 mmol/L (ref 22–32)
Calcium: 8.6 mg/dL — ABNORMAL LOW (ref 8.9–10.3)
Chloride: 114 mmol/L — ABNORMAL HIGH (ref 98–111)
Creatinine, Ser: 1.36 mg/dL — ABNORMAL HIGH (ref 0.61–1.24)
GFR, Estimated: 55 mL/min — ABNORMAL LOW (ref >60)
Glucose, Bld: 209 mg/dL — ABNORMAL HIGH (ref 70–99)
Potassium: 3.5 mmol/L (ref 3.5–5.1)
Sodium: 149 mmol/L — ABNORMAL HIGH (ref 135–145)

## 2023-10-21 LAB — GLUCOSE, CAPILLARY
Glucose-Capillary: 156 mg/dL — ABNORMAL HIGH (ref 70–99)
Glucose-Capillary: 174 mg/dL — ABNORMAL HIGH (ref 70–99)
Glucose-Capillary: 174 mg/dL — ABNORMAL HIGH (ref 70–99)
Glucose-Capillary: 197 mg/dL — ABNORMAL HIGH (ref 70–99)
Glucose-Capillary: 199 mg/dL — ABNORMAL HIGH (ref 70–99)
Glucose-Capillary: 213 mg/dL — ABNORMAL HIGH (ref 70–99)

## 2023-10-21 LAB — MAGNESIUM: Magnesium: 1.8 mg/dL (ref 1.7–2.4)

## 2023-10-21 LAB — PHOSPHORUS: Phosphorus: 2.5 mg/dL (ref 2.5–4.6)

## 2023-10-21 MED ORDER — INSULIN ASPART 100 UNIT/ML IJ SOLN
0.0000 [IU] | INTRAMUSCULAR | Status: DC
  Administered 2023-10-21 (×4): 3 [IU] via SUBCUTANEOUS
  Administered 2023-10-22: 5 [IU] via SUBCUTANEOUS
  Administered 2023-10-22: 3 [IU] via SUBCUTANEOUS
  Administered 2023-10-22: 5 [IU] via SUBCUTANEOUS

## 2023-10-21 MED ORDER — SODIUM CHLORIDE 0.9 % IV SOLN
3.0000 g | Freq: Four times a day (QID) | INTRAVENOUS | Status: AC
  Administered 2023-10-21 – 2023-10-25 (×18): 3 g via INTRAVENOUS
  Filled 2023-10-21 (×18): qty 8

## 2023-10-21 MED ORDER — FENTANYL CITRATE PF 50 MCG/ML IJ SOSY
25.0000 ug | PREFILLED_SYRINGE | INTRAMUSCULAR | Status: DC | PRN
  Administered 2023-10-21: 100 ug via INTRAVENOUS
  Administered 2023-10-21 – 2023-10-23 (×6): 50 ug via INTRAVENOUS
  Administered 2023-10-23 – 2023-10-24 (×3): 100 ug via INTRAVENOUS
  Administered 2023-10-26: 50 ug via INTRAVENOUS
  Filled 2023-10-21: qty 2
  Filled 2023-10-21 (×7): qty 1
  Filled 2023-10-21 (×3): qty 2

## 2023-10-21 MED ORDER — LINEZOLID 600 MG PO TABS
600.0000 mg | ORAL_TABLET | Freq: Two times a day (BID) | ORAL | Status: AC
  Administered 2023-10-21 – 2023-10-25 (×10): 600 mg
  Filled 2023-10-21 (×11): qty 1

## 2023-10-21 NOTE — Progress Notes (Signed)
 Nutrition Follow Up  DOCUMENTATION CODES:   Non-severe (moderate) malnutrition in context of social or environmental circumstances  INTERVENTION:  Continue tube feeds via Cortrak:  Osmolite 1.5 at 55 ml/h (1320 ml per day)  Prosource TF20 60 ml BID  Provides 2140 kcal, 122 gm protein, 1005 ml free water daily   Continued folic acid  1mg  daily Continued MVI w/ minerals daily  Discussed with critical care PA about adding water flushes to routine for increased sodium and chloride levels, will hold off for now  NUTRITION DIAGNOSIS:  Moderate Malnutrition related to social / environmental circumstances (lack of appetite, alcohol intake, recent decline) as evidenced by energy intake < 75% for > or equal to 3 months, moderate fat depletion, moderate muscle depletion, percent weight loss. Remains applicable GOAL:  Patient will meet greater than or equal to 90% of their needs Met with tube feeds at goal rate  MONITOR:   Labs, TF tolerance  REASON FOR ASSESSMENT:   Consult Enteral/tube feeding initiation and management  ASSESSMENT:   Pt with hx of diabetes, CKD III, neuropathy, HTN, and high cholesterol. Pt admitted following being found unresponsive on ground, diagnosed with R lateral midline shift and multiple hemorrhages.  6/19 admitted, intubated, amio started 6/20 extubated, CIWA protocol initiated 6/23 re-intubated 6/25 bronchoscopy conducted for secretions, Cortrak placed, podiatry consulted for osteomyelitis of R 2nd toe  Pt remains intubated, but weaning off sedation. Pt discussed by MD and RN during ICU rounds. No reported intolerances to tube feed. Pt was refeeding yesterday 6/24, but repletion corrected electrolyte levels which were within normal range today 6/25. Discussed possible need for free water flushes added into routine later in treatment course for high sodium and chloride levels. Plan for R second toe amputation on Friday 6/27. Bronchoscopy conducted for thick  secretions, continued antibiotic regimen. Continued CIWA protocol, completed phenobarbital  taper 6/24. Will continue to monitor vent status and any changes over treatment plan.  Patient is currently intubated on ventilator support MV: 11.7 L/min Temp (24hrs), Avg:98.1 F (36.7 C), Min:97.2 F (36.2 C), Max:98.4 F (36.9 C)  MAP (cuff):  Admit weight: 98.6kg  Current weight: 97.2kg   Intake/Output Summary (Last 24 hours) at 10/21/2023 0921 Last data filed at 10/21/2023 0700 Gross per 24 hour  Intake 2415.53 ml  Output 750 ml  Net 1665.53 ml   Net IO Since Admission: 3,302.17 mL [10/21/23 0921]  Drains/Lines: Cortrak Catheter: UOP 1,417mL  Medications reviewed and include:  Folic acid  1mg  daily Novolog  SSI Levothyroxine  MVI w/ minerals Protonix  Miralax  Senna Zoloft  Thiamine  100mg   Unasyn  Precedex  0.65mcg/mL  Labs reviewed:  CBG over last 24: 169-219 mg/dL Sodium 850, Chloride 885  Diet Order:   Diet Order             Diet NPO time specified  Diet effective now                  EDUCATION NEEDS:   Not appropriate for education at this time  Skin:  Skin Assessment: Skin Integrity Issues: Skin Integrity Issues:: Diabetic Ulcer Diabetic Ulcer: R toe  Last BM:  PTA  Height:  Ht Readings from Last 1 Encounters:  10/20/23 6' (1.829 m)   Weight:  Wt Readings from Last 1 Encounters:  10/21/23 97.2 kg   Ideal Body Weight:  80.9 kg  BMI:  Body mass index is 29.06 kg/m.  Estimated Nutritional Needs:   Kcal:  2000-2200  Protein:  115-130g  Fluid:  >/= 2L   Josette Glance,  MS, RDN, LDN Clinical Dietitian I Please reach out via secure chat

## 2023-10-21 NOTE — Procedures (Signed)
 Cortrak  Person Inserting Tube:  Jonathon [name], Jonathon [name], RD Tube Type:  Cortrak - 43 inches Tube Size:  10 Tube Location:  Left nare Initial Placement:  Stomach Secured by: Bridle Technique Used to Measure Tube Placement:  Marking at nare/corner of mouth Cortrak Secured At:  75 cm   Cortrak Tube Team Note:  Consult received to place a Cortrak feeding tube.   No x-ray is required. RN may begin using tube.   If the tube becomes dislodged please keep the tube and contact the Cortrak team at [email or web address] for replacement.  If after hours and replacement cannot be delayed, place a NG tube and confirm placement with an abdominal x-ray.    Jonathon [name], RD Registered Dietitian  See Amion for more information

## 2023-10-21 NOTE — Progress Notes (Addendum)
 STROKE TEAM PROGRESS NOTE    SIGNIFICANT HOSPITAL EVENTS  6/19: level 2 trauma after being found on the ground incontinent and shaking.  HR 200s with wide complex QRS, amio bolus given and drip started.  GCS 6. Intubated on arrival.  CT Head shows an acute intraparenchymal hematoma within the right lateral temporal lobe with surrounding vasogenic edema and 5 mm midline shift and scattered subarachnoid hemorrhages.  PTA xarelto  reversed with [facility] on Admission 22. 6/20: Repeat CT head early this a.m. stable. Extubated in afternoon. 6/21: MRI stable IPH, multiple acute/subacute cortical infarcts seen. 3% stopped.  6/23- re intubated 6/25-found to have osteomyelitis of right second toe, amputation planned for Friday, bronchoscopy performed and cortrak inserted  INTERIM HISTORY/SUBJECTIVE Patient has remained hemodynamically stable and afebrile overnight.  He has undergone a bronchoscopy due to thick secretions and has had a core track inserted.  He has been seen by podiatry for osteomyelitis of the right second toe, and amputation is planned for Friday pending evaluation by vascular surgery.  OBJECTIVE  CBC    Component Value Date/Time   WBC 2.7 (L) 10/20/2023 0559   RBC 4.29 10/20/2023 0559   HGB 13.1 10/20/2023 0559   HGB 17.4 08/12/2023 1622   HCT 39.7 10/20/2023 0559   HCT 53.7 (H) 08/12/2023 1622   PLT 68 (L) 10/20/2023 0559   PLT 127 (L) 08/12/2023 1622   MCV 92.5 10/20/2023 0559   MCV 93 08/12/2023 1622   MCH 30.5 10/20/2023 0559   MCHC 33.0 10/20/2023 0559   RDW 17.2 (H) 10/20/2023 0559   RDW 13.1 08/12/2023 1622   LYMPHSABS 0.9 08/31/2023 1632   LYMPHSABS 0.8 08/04/2022 1459   MONOABS 0.3 08/31/2023 1632   EOSABS 0.0 08/31/2023 1632   EOSABS 0.1 08/04/2022 1459   BASOSABS 0.0 08/31/2023 1632   BASOSABS 0.0 08/04/2022 1459    BMET    Component Value Date/Time   NA 149 (H) 10/21/2023 0557   NA 138 08/31/2023 1632   K 3.5 10/21/2023 0557   CL 114 (H)  10/21/2023 0557   CO2 26 10/21/2023 0557   GLUCOSE 209 (H) 10/21/2023 0557   BUN 25 (H) 10/21/2023 0557   BUN 15 08/31/2023 1632   CREATININE 1.36 (H) 10/21/2023 0557   CALCIUM  8.6 (L) 10/21/2023 0557   EGFR 51 (L) 08/31/2023 1632   GFRNONAA 55 (L) 10/21/2023 0557    IMAGING past 24 hours DG CHEST PORT 1 VIEW Result Date: 10/21/2023 CLINICAL DATA:  Aspiration. EXAM: PORTABLE CHEST 1 VIEW COMPARISON:  Chest radiograph dated 10/20/2023. FINDINGS: Endotracheal tube approximately 5.5 cm above the carina. Enteric tube extends below the diaphragm with tip beyond the inferior margin of the image. Left lung base/retrocardiac atelectasis or infiltrate. No large pleural effusion. The right lung is clear. No pneumothorax. Stable cardiac silhouette. Atherosclerotic calcification of the aorta. No acute osseous pathology. IMPRESSION: 1. Endotracheal tube approximately 5.5 cm above the carina. 2. Left lung base/retrocardiac atelectasis or infiltrate. Electronically Signed   By: Vanetta Chou M.D.   On: 10/21/2023 11:37   MR TOES RIGHT W WO CONTRAST Result Date: 10/20/2023 CLINICAL DATA:  Concern for osteomyelitis.  History of diabetes. EXAM: MRI OF THE RIGHT TOES WITHOUT AND WITH CONTRAST TECHNIQUE: Multiplanar, multisequence MR imaging of the right foot was performed both before and after administration of intravenous contrast. CONTRAST:  9mL GADAVIST GADOBUTROL 1 MMOL/ML IV SOLN COMPARISON:  MRI of the right foot dated June 09, 2019. FINDINGS: Bones/Joint/Cartilage Marrow edema with confluent T1  hypointensity of the second proximal and middle phalanges with enhancement on postcontrast sequences, most compatible with osteomyelitis. Overlying soft tissue ulceration/wound along the dorsal aspect of the mid second digit, at the level of the second PIP joint. No convincing marrow signal abnormality identified elsewhere to suggest osteomyelitis. Status post amputation of the distal phalanx of the great toe and  the distal half of the proximal phalanx of the great toe. No acute fracture or dislocation. Second through fifth digit hammertoe deformities. No joint effusion. 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 Ligaments Lisfranc ligament is intact. Muscles and Tendons Atrophy and increased T2 signal of the intrinsic musculature of the foot without significant associated enhancement is nonspecific and favored to reflect chronic denervation changes. Flexor hallucis longus tendinosis. No significant tenosynovitis. Soft tissue Cutaneous ulceration/wound along the dorsal aspect of the mid second digit, at the level of the second PIP joint with surrounding edema and enhancement, concerning for cellulitis. No loculated fluid collection. Nonspecific subcutaneous edema extending along the dorsal foot. IMPRESSION: 1. Marrow signal abnormality with enhancement of the right second proximal and middle phalanges, most compatible with osteomyelitis. Overlying soft tissue ulceration/wound along the dorsal aspect of the mid second digit with findings suggestive of cellulitis. No abscess. 2. 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 3. Atrophy and increased T2 signal of the intrinsic musculature of the foot without significant associated enhancement is nonspecific and favored to reflect chronic denervation changes. 4. Flexor hallucis longus tendinosis. Electronically Signed   By: Harrietta Sherry M.D.   On: 10/20/2023 16:56    Vitals:   10/21/23 0900 10/21/23 1000 10/21/23 1100 10/21/23 1123  BP: 99/66 111/74 92/67   Pulse: 98 (!) 104 78   Resp: (!) 22 18 18    Temp: 98.1 F (36.7 C) 98.8 F (37.1 C) 99 F (37.2 C)   TempSrc:      SpO2: 100% 100% 100% 100%  Weight:      Height:        PHYSICAL EXAM General: Elderly male, acutely ill CV: paroxysmal Afib on monitor Respiratory: Intubated, mechanically ventilated on full  support   NEURO: (Patient recently received large dose of fentanyl  for procedure) pupils equal round and reactive, no spontaneous movement, cough and gag absent, no response to noxious stimuli  Temp:  [97.9 F (36.6 C)-99 F (37.2 C)] 99 F (37.2 C) (06/25 1100) Pulse Rate:  [67-104] 78 (06/25 1100) Resp:  [12-22] 18 (06/25 1100) BP: (92-139)/(59-94) 92/67 (06/25 1100) SpO2:  [100 %] 100 % (06/25 1123) FiO2 (%):  [40 %] 40 % (06/25 1123) Weight:  [97.2 kg] 97.2 kg (06/25 0500)   Most Recent NIH: 21     ASSESSMENT/PLAN  Mr. Blade Scheff is a 73 y.o. male with  73 y.o. male with hx of anemia, afib on xarleto, anxiety, CKD stage 3, DM without complication, HLD, HTN, Hypothyroidism, PUD presenting as a level 2 trauma after being found on the ground incontinent and shaking. He was given 5mg  of versed  by EMS due to combativeness. He is also on doxycycline  for a toe infection. HR 200s with wide complex QRS, amio bolus given and drip started. GCS 6. Intubated on arrival. CT Head shows an acute intraparenchymal hematoma within the right lateral temporal lobe with surrounding vasogenic edema and 5 mm midline shift and scattered subarachnoid hemorrhages. PTA xarelto  reversed with [facility] on Admission 22.  ICH:  right temporoparietal  ICH with scattered SAH, etiology: hypertensive in the setting of DOAC CT Head Acute IPH within right lateral temporal lobe with surrounding vasogenic edema and mass effect, midline shift of approximately 5 mm to left. Scattered subarachnoid hemorrhage CTA head & neck Negative for spot sign, intracranial aneurysm, or evidence of vascular malformation. Right sided subdural hematoma (estimated at 4 mm) suspected in addition to intra-axial and subarachnoid blood. Repeat CT head 6/20 early AM: overall stable Right temporoparietal parenchymal hemorrhage with surrounding vasogenic edema and mass effect, not significantly changed.  Midline shift is now 6 mm. Scattered  subarachnoid hemorrhage, similar to prior. Status post [facility]  reversal MRI  with and without contrast: Stable right temporoparietal IPH and associated vasogenic edema. Mass effect and midline shift similar to prior study, measuring 6 mm. Multiple punctate acute/subacute bilateral cortical infarcts 6/23- CT Head- Stable hematoma, with MLS 7mm CT repeat pending in am 2D Echo: EF 50 to 55%, global hypokinesis, degenerative mitral valve LDL 54 in 08/2023 HgbA1c 5.6 in 08/2023 VTE prophylaxis - SCDs Xarelto  (rivaroxaban ) daily prior to admission, continue No antithrombotic due to ICH Therapy recommendations:  CIR Disposition: Pending  Cerebral Edema Repeat CT 6/23 overall stable hematoma, and slight increase cerebral edema with MLS 7mm CT repeat in am NS consulted, no surgical intervention at this time. 3% bolus of 250 given 6/19 3% hypertonic saline stopped 6/21 -> on NS @ 50-> off Na 138-134-136-144-150-148-154-151 -149- 149  Seizure like activity Incontinence with shaking at onset per report No seizure since admission LTM EEG no seizure-like activity seen, discontinued 6/20 S/p keppra  load Keppra  for 7 days   Acute Respiratory Failure ?Aspiration Pneumonia Intubated 6/19 d/t mental status, inability to protect airway Extubated 6/20 afternoon -> 6L [place] -> 15L [facility]->re-intubated 6/23 Tmax 38.4C -> repeat blood cultures with no growth WBC 4.6->5.7->5.8->2.7 CXR with New right perihilar airspace disease S/p bronch 6/25, no mucous plugs On unasyn    Atrial fibrillation Home Meds: Xarelto  20 mg daily Continue telemetry monitoring No anticoagulation now due to ICH  Alcohol withdrawal Alcohol abuse Intermittent agitation Continue CIWA protocol On precedex  -> off Phenobarbital  taper discontinued  Hypertension Home meds: Amlodipine  5 mg daily Stable now BP goal < 160  Hyperlipidemia Home meds: Lipitor  80 mg LDL 54, goal < 70 Consider to resume statin on  discharge  Substance Abuse UDS positive for THC       Ready to quit? N/A TOC consult for cessation placed  Dysphagia Patient has post-stroke dysphagia, SLP consulted Now n.p.o. On TF @ 55  Other Stroke Risk Factors Obesity, Body mass index is 29.06 kg/m., BMI >/= 30 associated with increased stroke risk, recommend weight loss, diet and exercise as appropriate Former smoker  Other Active Problems Right foot injury, s/p recent graft and recent infection Was on doxycycline , now Unasyn  WBC 4.7-4.6-5.7-5.8-2.7 MRI right foot reveals osteomyelitis of second toe, amputation planned for Friday pending vascular surgery evaluation Repeat blood culture NGTD CKD3b, creatinine 1.50--1.35--1.58--1.50--1.38--1.36, on [facility] day # 6  Patient seen and examined by NP/APP with MD. MD to update note as needed.   Cortney E Everitt Clint Kill , MSN, AGACNP-BC [facility] See Amion for schedule and pager information 10/21/2023 2:06 PM  ATTENDING NOTE: I reviewed above note and agree with the assessment and plan. Pt was seen and examined.   No family is at the bedside. Pt still intubated on vent, nonresponsive, s/p bronch today and cortrak placement. Now on TF. MRI showed osteomyelitis of right toe, plan for amputation  with VVS Friday. Given off sedation but still has significant AMS, will repeat CT in am  For detailed assessment and plan, please refer to above as I have made changes wherever appropriate.   Ary Cummins, MD PhD Stroke Neurology 10/21/2023 9:05 PM  This patient is critically ill due to ICH and cerebral edema, A-fib off AC now, osteomyelitis and at significant risk of neurological worsening, death form recurrent stroke, hemorrhagic admission, brain herniation, heart failure, sepsis, septic shock. This patient's care requires constant monitoring of vital signs, hemodynamics, respiratory and cardiac monitoring, review of multiple databases, neurological assessment,  discussion with family, other specialists and medical decision making of high complexity. I spent 30 minutes of neurocritical care time in the care of this patient. Discussed with NP CCM

## 2023-10-21 NOTE — TOC Progression Note (Signed)
 Transition of Care [facility]) - Progression Note    Patient Details  Name: Jonathon [name] MRN: [id number] Date of Birth: 05/26/1950  Transition of Care [facility]) CM/SW Contact  Inocente GORMAN Kindle, LCSW Phone Number: 10/21/2023, 4:10 PM  Clinical Narrative:    TOC continuing to follow for needs.         Expected Discharge Plan and Services                                               Social Determinants of Health (SDOH) Interventions SDOH Screenings   Food Insecurity: No Food Insecurity (05/30/2022)  Housing: Low Risk  (05/30/2022)  Transportation Needs: No Transportation Needs (05/30/2022)  Utilities: Not At Risk (05/30/2022)  Alcohol Screen: Low Risk  (05/30/2022)  Depression (PHQ2-9): Low Risk  (10/01/2023)  Financial Resource Strain: Low Risk  (05/30/2022)  Physical Activity: Inactive (05/30/2022)  Social Connections: Moderately Isolated (05/30/2022)  Stress: No Stress Concern Present (05/30/2022)  Tobacco Use: Medium Risk (10/21/2023)    Readmission Risk Interventions     No data to display

## 2023-10-21 NOTE — Plan of Care (Signed)
 Patient required increase on precedex  and restraints reapplied during night shift due to pulling tubes and lines anad asynchrony with ventilator. Once patient was synchronous with vent settings, vital signs remained within normal limits with the exception of a Qtc >500

## 2023-10-21 NOTE — Procedures (Signed)
 Bronchoscopy Procedure Note  Jonathon [name]  [id number]  08-29-1950  Date:10/21/23  Time:10:00 AM   Provider Performing:Shreena Baines A Talmage Teaster   Procedure(s):  Flexible Bronchoscopy [phone])  Indication(s) Mucous plugging  Consent Discussed with family members at bedside  Anesthesia On Precedex , fentanyl    Time Out Verified patient identification, verified procedure, site/side was marked, verified correct patient position, special equipment/implants available, medications/allergies/relevant history reviewed, required imaging and test results available.   Sterile Technique Usual hand hygiene, masks, gowns, and gloves were used   Procedure Description Bronchoscope advanced through endotracheal tube and into airway.  Airways were examined down to subsegmental level with findings noted below.   Following diagnostic evaluation, BAL(s) performed in 20 with normal saline and return of 10 fluid  Findings:  Thin secretions bilaterally Mild inflammation in the airways No mucous plugs   Complications/Tolerance None; patient tolerated the procedure well. Chest X-ray is not needed post procedure.   EBL None   Specimen(s) BAL left lower lobe sent for analysis

## 2023-10-21 NOTE — Progress Notes (Signed)
 OT Cancellation Note  Patient Details Name: Jonathon [name] MRN: [id number] DOB: [birth year]   Cancelled Treatment:      Reason Eval/Treat Not Completed: Medical issues which prohibited therapy this morning, pt remains intubated and sedated.   Leita Howell, OTR/L,CBIS  Supplemental OT - [facility] and [facility] Secure Chat Preferred   10/21/2023, 1:38 PM

## 2023-10-21 NOTE — Progress Notes (Addendum)
 NAME:  Jonathon [name], MRN:  [id number], DOB:  July 26, 1950, LOS: 6 ADMISSION DATE:  10/15/2023, CONSULTATION DATE: 10/15/2023 REFERRING MD: EDP, CHIEF COMPLAINT: Intracerebral hemorrhage  History of Present Illness:  73 year old man who was found down by wife poorly responsive required intubation and transportation to the emergency.  CT scan revealed a right intraparenchymal hemorrhage with other areas of injury.  He is currently intubated in atrial fibrillation ventricular response on amiodarone  drip being treated with fentanyl  and propofol  for tube tolerance.  He has been seen by neurosurgery and neurology.  Hyperatremia treatment we await the final results of CT scan of the head to see if there is any surgical options.  Pertinent Medical History:   Past Medical History:  Diagnosis Date   Anemia due to blood loss, acute 2014   2nd GIB   Anxiety    CKD (chronic kidney disease), stage III (HCC) 2014   Diabetes mellitus without complication (HCC)    Gout    High cholesterol    Hypertension 2014   Hypothyroid    Neuropathy    PUD (peptic ulcer disease) 2014    Significant Hospital Events: Including procedures, antibiotic start and stop dates in addition to other pertinent events   6/19 Admitted w/ ICH felt 2/2 HTN and c/b SDH p/ fall. Started AEDs. HTNS. Seen by n surg felt unlikely that surg would improve outcome. Seen by neuro. Placed on clevi. Got AC reversed in ER. Unasyn  started for asp 6/20 weaning CT imaging stable ECHO ordered. 1/2 BC + felt contamination but does bring endocarditis into question. Extubated with increased agitation post-extubation, placed on precedex . Patient does drink alcohol every night, placed on CIWA 6/21 Echo with EF 50-55%, low normal LV function +global hypokinesis, degenerative MV without MR, no evidence of valvular vegetations. 6/23: reintubated after poor responsiveness, gurgling and desaturations 6/24: MRI today for R foot osteo r/o  6/25: MRI  yesterday with osteo - adding linezolid. Will consult podiatry. Bronch completed. Waking up.   Interim History / Subjective:  NAEON. MRI yesterday with osteo of his toe. Starting him on linezolid. Will contact podiatry about need for amputation. Additionally, had mucous plug probably this morning. Had high peak pressures, decreased air movement on the left. Lavaged with RRT and improved. Completed bronch after and airways look clear. Sent BAL. Continue to try and wake up for SBT.  Objective:   Blood pressure 99/66, pulse 98, temperature 98.1 F (36.7 C), resp. rate (!) 22, height 6' (1.829 m), weight 97.2 kg, SpO2 100%.    Vent Mode: PRVC FiO2 (%):  [40 %] 40 % Set Rate:  [16 bmp] 16 bmp Vt Set:  [620 mL] 620 mL PEEP:  [5 cmH20] 5 cmH20 Plateau Pressure:  [18 cmH20] 18 cmH20   Intake/Output Summary (Last 24 hours) at 10/21/2023 1008 Last data filed at 10/21/2023 1000 Gross per 24 hour  Intake 2454.3 ml  Output 400 ml  Net 2054.3 ml   Filed Weights   10/17/23 0500 10/20/23 0500 10/21/23 0500  Weight: 98.6 kg 97.2 kg 97.2 kg   Physical Examination: General: Acutely ill-appearing older man, intubated, sedated  HEENT: [place]/AT, anicteric sclera, PERRL4MM, dry mucous membranes. Neuro: Sedated. Withdraws to pain in lower extremities. Not following commands. No spontaneous movement of extremities on exam. Stronger cough/gag today  CV: rrr, no m/r/g PULM: decreased air movement on left, otherwise clear, vented  GI: Soft, nontender, nondistended. Normoactive bowel sounds. Extremities: trace edema; right 2nd toe swollen, hot with eschar, no surrounding  erythema  Resolved problem List:   Assessment and Plan:  R intraparenchymal hemorrhage involving right temporal lobe with vasogenic edema, (R) SDH AND also adjacent scattered subarachnoid hemorrhages primarily within the adjacent cerebral area.  Felt primary injury d/t HTN on DOAC. DOAC reversed in ED. GCS 6 on arrival. CT Head 6/20 with  multifocal ICH (stable since 6/19), mixed density R hemisphere intraaxial hematoma, R-sided SDH, scattered bilateral SAH, stable intracranial mass effect with 6-54mm R to L midline shift. MRI Brain 6/21 with stable R temporoparietal IPH with associated vasogenic edema/mass effect and shift (6mm), multiple punctate bilateral infarcts. LTM EEG negative for seizures. - NSGY/Stroke teams following, appreciate recommendations - Per NSGY, no role for surgical intervention at this juncture - Goal SBP < 160 - AEDs per Neuro - Frequent neurochecks - Neuroprotective measures: HOB > 30 degrees, normoglycemia, normothermia, electrolytes WNL - PT/OT/SLP as able  Acute Hypoxemic Respiratory Failure Ventilator management s/p ICH and ineffective airway clearance Concern for aspiration PNA Extubated 6/20; reintubated 6/23. Bronch 6/25 after mucous plug. Was lavaged out by RRT. Bronch looked clear.  - 6/24 trach aspirate with GPC - on Unasyn   - BAL sent 6/25 - f/u - SAT/SBT   - full mechanical vent support, lung protective ventilation 6-8cc/kg Vt - VAP and PAD bundle in place  - titrate FiO2 to sat goal >92  - maintain peak/plats <30, driving pressures <84    Acute Metabolic Encephalopathy Multifactorial in the setting of ICH/SDH, ?alcohol withdrawal. - Correct metabolic derangements as able - Avoid sedating agents, though this is difficult in the setting of above - PAD protocol as above  Alcohol Withdrawal Patient has multiple liquor drinks nightly. - phenobarb taper stopped 6/24 per neuro request  - weaning off precedex   - CIWA protocol with Ativan  - d/c'd haldol  with Qtc prolongation - Continue thiamine /folate - MV when able to tolerate PO  Ectopy and tachycardia History of Afib (on Xarelto ) Previously required amiodarone  gtt. - Cardiac monitoring - Optimize electrolytes for K > 4, Mg > 2 - AC held in the setting of multiple areas of hemorrhage - Low threshold to resume amiodarone  if  ongoing arrhythmias/ectopy  Possible bacteremia, r/o endocarditis 1/2 BC + staph organism (not SA), likely contamination. Echo 6/21 with EF 50-55%, low normal LV function +global hypokinesis, degenerative MV without MR, no evidence of valvular vegetations - Trend WBC, fever curve - BCX 6/23 negative, f/u trach aspirate and BAL - Continue broad-spectrum antibiotics Unasyn , Banc - do not feel he needs TEE at this time  Poorly controlled hypertension - Goal SBP < 160 - Hydralazine , labetalol  PRN  Diabetes mellitus with poor control - SSI - CBGs Q4H - Goal CBG 140-180  CKD Stage 3b - GFR 30 to 44 Fluid and electrolyte imbalance: hypomagnesemia and hypophosphatemia  - Trend BMP - Replete electrolytes as indicated - Monitor I&Os - Avoid nephrotoxic agents as able - Ensure adequate renal perfusion  Peripheral vascular disease with neuropathy and loss of right great toe fourth toe as well as debridement and recent skin graft of 2nd toe on right foot MRI 6/24 with osteomyelitis.  - start linezolid  - consult to podiatry - spoke with Dr. Malvin who will evaluate the patient regarding amputation  Best Practice (right click and Reselect all SmartList Selections daily)   Diet/type: tubefeeds and NPO DVT prophylaxis: SCD  Pressure ulcer(s): present on admission  GI prophylaxis: PPI Lines: N/A Foley:  Yes, and it is still needed Code Status:  full code Last date  of multidisciplinary goals of care discussion: [Bedside 6/25 with family]  Critical care time:   The patient is critically ill with multiple organ system failure and requires high complexity decision making for assessment and support, frequent evaluation and titration of therapies, advanced monitoring, review of radiographic studies and interpretation of complex data.   Critical Care Time devoted to patient care services, exclusive of separately billable procedures, described in this note is 35 minutes.  Tinnie FORBES Furth, PA-C [facility] 10/21/23 10:08 AM  Please see [email or web address] for pager details.  From 7A-7P if no response, please call [phone] After hours, please call ELink [phone]

## 2023-10-21 NOTE — Progress Notes (Signed)
 PT Cancellation Note  Patient Details Name: Jonathon [name] MRN: [id number] DOB: [birth year]   Cancelled Treatment:    Reason Eval/Treat Not Completed: Medical issues which prohibited therapy this morning, pt remains intubated and sedated.   [name], PT, DPT   Acute Rehabilitation Department Office [phone] Secure Chat Communication Preferred   [name] 10/21/2023, 9:40 AM

## 2023-10-21 NOTE — Progress Notes (Signed)
 50 mcg fentanyl  given for vent compliance per VO Dr Neda.

## 2023-10-21 NOTE — Consult Note (Signed)
 PODIATRY CONSULTATION  NAME Jonathon [name] MRN [id number] DOB 07-27-1950 DOA 10/15/2023   Reason for consult: Right 2nd toe wound  Attending/Consulting physician: A. Olalere MD  History of present illness:  73 y.o. male with right intraparenchymal hemorrhage, subdural hemorrhage status post fall History of hypertension Reintubated 10/19/2023 hypoxemic respiratory failure inability to protect his airway Repeat CT showing stable findings MRI of his toe does reveal osteomyelitis  Patient is followed by Dr. Tobie in the outpatient setting for the right second toe ulceration.  He was seen shortly before he had a stroke.  During this admission MRI was obtained revealing osteomyelitis of the right second toe and was consulted to assist with amputation.  He is still intubated but off sedation at this time.    Past Medical History:  Diagnosis Date   Anemia due to blood loss, acute 2014   2nd GIB   Anxiety    CKD (chronic kidney disease), stage III (HCC) 2014   Diabetes mellitus without complication (HCC)    Gout    High cholesterol    Hypertension 2014   Hypothyroid    Neuropathy    PUD (peptic ulcer disease) 2014       Latest Ref Rng & Units 10/20/2023    5:59 AM 10/19/2023    7:18 AM 10/18/2023    7:03 AM  CBC  WBC 4.0 - 10.5 K/uL 2.7  5.8  5.7   Hemoglobin 13.0 - 17.0 g/dL 86.8  85.3  85.3   Hematocrit 39.0 - 52.0 % 39.7  45.4  44.7   Platelets 150 - 400 K/uL 68  79  62        Latest Ref Rng & Units 10/21/2023    5:57 AM 10/20/2023    5:59 AM 10/19/2023    7:18 AM  BMP  Glucose 70 - 99 mg/dL 790  813  875   BUN 8 - 23 mg/dL 25  24  19    Creatinine 0.61 - 1.24 mg/dL 8.63  8.61  8.49   Sodium 135 - 145 mmol/L 149  149  151   Potassium 3.5 - 5.1 mmol/L 3.5  2.7  3.4   Chloride 98 - 111 mmol/L 114  116  113   CO2 22 - 32 mmol/L 26  25  26    Calcium  8.9 - 10.3 mg/dL 8.6  8.4  8.8       Physical Exam: Lower Extremity Exam 2+ DP pulse on the right foot however  nonpalpable PT pulse does have a history of bypass right lower extremity  Necrotic ulceration to the dorsal PIPJ of the right second digit with exposed proximal phalanx.  Erythema and edema of the toe  Prior partial hallux amputation right foot Hammertoe deformity of the right 2nd toe     ASSESSMENT/PLAN OF CARE 73 y.o. 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  Also with right second toe ulceration with underlying osteomyelitis.    MR toes R W WO contrast: Marrow signal abnormality with handsome of the right second proximal middle phalanges compatible with osteomyelitis.  Prior amputation of the right hallux through the mid proximal phalanx level.  -Check ABI PVR studies bilateral lower extremity given history of bypass and diminished PT pulse.  Will consider vascular consultation if significantly abnormal/diminished from prior -If no vascular intervention is required will proceed with right  second toe amputation Friday.  N.p.o. midnight prior, decision for surgery pending ABI findings  - Continue IV abx broad spectrum pending further culture data - Anticoagulation: Okay to continue per primary - Wound care: None required preop - WB status: Patient intubated will be weightbearing as tolerated in postop shoe from my perspective following the amputation - Will continue to follow   Thank you for the consult.  Please contact me directly with any questions or concerns.           Marolyn JULIANNA Honour, DPM [facility] / [facility]    [address] [place], [place] [address]                Office [phone]  Fax [phone]

## 2023-10-22 ENCOUNTER — Inpatient Hospital Stay (HOSPITAL_COMMUNITY)

## 2023-10-22 DIAGNOSIS — S06310A Contusion and laceration of right cerebrum without loss of consciousness, initial encounter: Secondary | ICD-10-CM | POA: Diagnosis not present

## 2023-10-22 DIAGNOSIS — S065XAA Traumatic subdural hemorrhage with loss of consciousness status unknown, initial encounter: Secondary | ICD-10-CM | POA: Diagnosis not present

## 2023-10-22 DIAGNOSIS — M869 Osteomyelitis, unspecified: Secondary | ICD-10-CM | POA: Diagnosis not present

## 2023-10-22 DIAGNOSIS — I69391 Dysphagia following cerebral infarction: Secondary | ICD-10-CM | POA: Diagnosis not present

## 2023-10-22 DIAGNOSIS — F10930 Alcohol use, unspecified with withdrawal, uncomplicated: Secondary | ICD-10-CM | POA: Diagnosis not present

## 2023-10-22 DIAGNOSIS — G936 Cerebral edema: Secondary | ICD-10-CM | POA: Diagnosis not present

## 2023-10-22 DIAGNOSIS — J9601 Acute respiratory failure with hypoxia: Secondary | ICD-10-CM | POA: Diagnosis not present

## 2023-10-22 DIAGNOSIS — L97509 Non-pressure chronic ulcer of other part of unspecified foot with unspecified severity: Secondary | ICD-10-CM

## 2023-10-22 DIAGNOSIS — R29721 NIHSS score 21: Secondary | ICD-10-CM | POA: Diagnosis not present

## 2023-10-22 DIAGNOSIS — I609 Nontraumatic subarachnoid hemorrhage, unspecified: Secondary | ICD-10-CM | POA: Diagnosis not present

## 2023-10-22 DIAGNOSIS — I611 Nontraumatic intracerebral hemorrhage in hemisphere, cortical: Secondary | ICD-10-CM | POA: Diagnosis not present

## 2023-10-22 DIAGNOSIS — Z87891 Personal history of nicotine dependence: Secondary | ICD-10-CM

## 2023-10-22 LAB — BASIC METABOLIC PANEL WITH GFR
Anion gap: 8 (ref 5–15)
BUN: 35 mg/dL — ABNORMAL HIGH (ref 8–23)
CO2: 25 mmol/L (ref 22–32)
Calcium: 9.3 mg/dL (ref 8.9–10.3)
Chloride: 115 mmol/L — ABNORMAL HIGH (ref 98–111)
Creatinine, Ser: 1.58 mg/dL — ABNORMAL HIGH (ref 0.61–1.24)
GFR, Estimated: 46 mL/min — ABNORMAL LOW (ref >60)
Glucose, Bld: 193 mg/dL — ABNORMAL HIGH (ref 70–99)
Potassium: 4 mmol/L (ref 3.5–5.1)
Sodium: 148 mmol/L — ABNORMAL HIGH (ref 135–145)

## 2023-10-22 LAB — BRAIN NATRIURETIC PEPTIDE: B Natriuretic Peptide: 112.7 pg/mL — ABNORMAL HIGH (ref 0.0–100.0)

## 2023-10-22 LAB — CBC
HCT: 43.3 % (ref 39.0–52.0)
Hemoglobin: 14.2 g/dL (ref 13.0–17.0)
MCH: 30.7 pg (ref 26.0–34.0)
MCHC: 32.8 g/dL (ref 30.0–36.0)
MCV: 93.7 fL (ref 80.0–100.0)
Platelets: 83 10*3/uL — ABNORMAL LOW (ref 150–400)
RBC: 4.62 MIL/uL (ref 4.22–5.81)
RDW: 17.6 % — ABNORMAL HIGH (ref 11.5–15.5)
WBC: 5.4 10*3/uL (ref 4.0–10.5)
nRBC: 0 % (ref 0.0–0.2)

## 2023-10-22 LAB — GLUCOSE, CAPILLARY
Glucose-Capillary: 181 mg/dL — ABNORMAL HIGH (ref 70–99)
Glucose-Capillary: 212 mg/dL — ABNORMAL HIGH (ref 70–99)
Glucose-Capillary: 261 mg/dL — ABNORMAL HIGH (ref 70–99)
Glucose-Capillary: 268 mg/dL — ABNORMAL HIGH (ref 70–99)
Glucose-Capillary: 170 mg/dL — ABNORMAL HIGH (ref 70–99)
Glucose-Capillary: 207 mg/dL — ABNORMAL HIGH (ref 70–99)

## 2023-10-22 LAB — CULTURE, RESPIRATORY W GRAM STAIN

## 2023-10-22 LAB — MAGNESIUM: Magnesium: 2 mg/dL (ref 1.7–2.4)

## 2023-10-22 LAB — CULTURE, BAL-QUANTITATIVE W GRAM STAIN

## 2023-10-22 LAB — PHOSPHORUS: Phosphorus: 4.2 mg/dL (ref 2.5–4.6)

## 2023-10-22 MED ORDER — INSULIN ASPART 100 UNIT/ML IJ SOLN
0.0000 [IU] | INTRAMUSCULAR | Status: DC
  Administered 2023-10-22 (×2): 11 [IU] via SUBCUTANEOUS
  Administered 2023-10-22: 4 [IU] via SUBCUTANEOUS
  Administered 2023-10-23: 11 [IU] via SUBCUTANEOUS
  Administered 2023-10-23: 7 [IU] via SUBCUTANEOUS
  Administered 2023-10-23: 11 [IU] via SUBCUTANEOUS
  Administered 2023-10-23: 7 [IU] via SUBCUTANEOUS
  Administered 2023-10-23: 4 [IU] via SUBCUTANEOUS
  Administered 2023-10-23 – 2023-10-24 (×5): 7 [IU] via SUBCUTANEOUS
  Administered 2023-10-24: 11 [IU] via SUBCUTANEOUS
  Administered 2023-10-24: 7 [IU] via SUBCUTANEOUS
  Administered 2023-10-24: 4 [IU] via SUBCUTANEOUS
  Administered 2023-10-25 (×5): 7 [IU] via SUBCUTANEOUS
  Administered 2023-10-26 (×4): 4 [IU] via SUBCUTANEOUS
  Administered 2023-10-26: 3 [IU] via SUBCUTANEOUS
  Administered 2023-10-26 (×2): 4 [IU] via SUBCUTANEOUS
  Administered 2023-10-27: 3 [IU] via SUBCUTANEOUS
  Administered 2023-10-27 (×2): 4 [IU] via SUBCUTANEOUS
  Administered 2023-10-27 (×2): 3 [IU] via SUBCUTANEOUS
  Administered 2023-10-28 (×2): 4 [IU] via SUBCUTANEOUS
  Administered 2023-10-28: 3 [IU] via SUBCUTANEOUS
  Administered 2023-10-28 (×2): 4 [IU] via SUBCUTANEOUS
  Administered 2023-10-29 (×4): 3 [IU] via SUBCUTANEOUS

## 2023-10-22 MED ORDER — FREE WATER
200.0000 mL | Freq: Three times a day (TID) | Status: DC
  Administered 2023-10-22 – 2023-10-28 (×19): 200 mL

## 2023-10-22 NOTE — Progress Notes (Addendum)
 STROKE TEAM PROGRESS NOTE    SIGNIFICANT HOSPITAL EVENTS  6/19: level 2 trauma after being found on the ground incontinent and shaking.  HR 200s with wide complex QRS, amio bolus given and drip started.  GCS 6. Intubated on arrival.  CT Head shows an acute intraparenchymal hematoma within the right lateral temporal lobe with surrounding vasogenic edema and 5 mm midline shift and scattered subarachnoid hemorrhages.  PTA xarelto  reversed with [facility] on Admission 22. 6/20: Repeat CT head early this a.m. stable. Extubated in afternoon. 6/21: MRI stable IPH, multiple acute/subacute cortical infarcts seen. 3% stopped.  6/23- re intubated 6/25-found to have osteomyelitis of right second toe, amputation planned for Friday, bronchoscopy performed and cortrak inserted  INTERIM HISTORY/SUBJECTIVE Patient is seen in his room with multiple family members at the bedside.  He has remained hemodynamically stable with Tmax of 99.9 overnight.  He is more alert today.  Plan is for amputation of right second toe tomorrow.  OBJECTIVE  CBC    Component Value Date/Time   WBC 5.4 10/22/2023 0534   RBC 4.62 10/22/2023 0534   HGB 14.2 10/22/2023 0534   HGB 17.4 08/12/2023 1622   HCT 43.3 10/22/2023 0534   HCT 53.7 (H) 08/12/2023 1622   PLT 83 (L) 10/22/2023 0534   PLT 127 (L) 08/12/2023 1622   MCV 93.7 10/22/2023 0534   MCV 93 08/12/2023 1622   MCH 30.7 10/22/2023 0534   MCHC 32.8 10/22/2023 0534   RDW 17.6 (H) 10/22/2023 0534   RDW 13.1 08/12/2023 1622   LYMPHSABS 0.9 08/31/2023 1632   LYMPHSABS 0.8 08/04/2022 1459   MONOABS 0.3 08/31/2023 1632   EOSABS 0.0 08/31/2023 1632   EOSABS 0.1 08/04/2022 1459   BASOSABS 0.0 08/31/2023 1632   BASOSABS 0.0 08/04/2022 1459    BMET    Component Value Date/Time   NA 148 (H) 10/22/2023 0534   NA 138 08/31/2023 1632   K 4.0 10/22/2023 0534   CL 115 (H) 10/22/2023 0534   CO2 25 10/22/2023 0534   GLUCOSE 193 (H) 10/22/2023 0534   BUN 35 (H)  10/22/2023 0534   BUN 15 08/31/2023 1632   CREATININE 1.58 (H) 10/22/2023 0534   CALCIUM  9.3 10/22/2023 0534   EGFR 51 (L) 08/31/2023 1632   GFRNONAA 46 (L) 10/22/2023 0534    IMAGING past 24 hours CT HEAD WO CONTRAST ( ) Result Date: 10/22/2023 EXAM: CT HEAD WITHOUT 10/22/2023 05:02:01 AM TECHNIQUE: CT of the head was performed without the administration of intravenous contrast. Automated exposure control, iterative reconstruction, and/or weight based adjustment of the mA/kV was utilized to reduce the radiation dose to as low as reasonably achievable. COMPARISON: CT head without contrast 10/19/2023. CLINICAL HISTORY: Stroke, hemorrhagic. FINDINGS: BRAIN AND VENTRICLES: Right temporal parenchymal hematoma is again noted with expected evolution. Overall size is similar to the prior studies measuring 8.8 x 2.4 x 3.4 cm. Concerning vasogenic edema and mass effect similar to prior study. Midline shift is 6 mm. Subarachnoid hemorrhage over the convexity is less prominent than previously seen. No new hemorrhage is present. ORBITS: Bilateral lens replacements are noted. The globes and orbits are otherwise within normal limits. SINUSES AND MASTOIDS: No acute abnormality. SOFT TISSUES AND SKULL: No acute skull fracture. No acute soft tissue abnormality. IMPRESSION: 1. Right temporal parenchymal hematoma with expected evolution, similar in size to prior studies, associated vasogenic edema, and mass effect with 6 mm midline shift. 2. Subarachnoid hemorrhage over the convexity, less prominent than previously seen. 3. No  new hemorrhage. Electronically signed by: Jonathon Necessary MD 10/22/2023 05:19 AM EDT RP Workstation: [id number]    Vitals:   10/22/23 0800 10/22/23 0817 10/22/23 0900 10/22/23 1000  BP: (!) 153/87  (!) 135/90 (!) 148/86  Pulse: (!) 113  (!) 114 (!) 101  Resp: (!) 0  (!) 0 (!) 3  Temp: 98.6 F (37 C)  98.8 F (37.1 C) 99 F (37.2 C)  TempSrc: Esophageal     SpO2: 100% 100% 100% 100%   Weight:      Height:        PHYSICAL EXAM General: Elderly male, acutely ill CV: paroxysmal Afib on monitor Respiratory: Intubated, mechanically ventilated on full support   NEURO: (On no sedation) pupils equal round and reactive, opens eyes to voice but does not track examiner, blinks to threat bilaterally, does not follow commands or respond to noxious stimuli  Temp:  [98.2 F (36.8 C)-99.9 F (37.7 C)] 99 F (37.2 C) (06/26 1000) Pulse Rate:  [89-136] 101 (06/26 1000) Resp:  [0-26] 3 (06/26 1000) BP: (93-153)/(62-95) 148/86 (06/26 1000) SpO2:  [100 %] 100 % (06/26 1000) FiO2 (%):  [40 %] 40 % (06/26 0817) Weight:  [100.1 kg] 100.1 kg (06/26 0403)   Most Recent NIH: 21     ASSESSMENT/PLAN  [name] is a 73 y.o. male with  73 y.o. male with hx of anemia, afib on xarleto, anxiety, CKD stage 3, DM without complication, HLD, HTN, Hypothyroidism, PUD presenting as a level 2 trauma after being found on the ground incontinent and shaking. He was given 5mg  of versed  by EMS due to combativeness. He is also on doxycycline  for a toe infection. HR 200s with wide complex QRS, amio bolus given and drip started. GCS 6. Intubated on arrival. CT Head shows an acute intraparenchymal hematoma within the right lateral temporal lobe with surrounding vasogenic edema and 5 mm midline shift and scattered subarachnoid hemorrhages. PTA xarelto  reversed with [facility] on Admission 22.  ICH:  right temporoparietal ICH with scattered SAH, etiology: hypertensive in the setting of DOAC CT Head Acute IPH within right lateral temporal lobe with surrounding vasogenic edema and mass effect, midline shift of approximately 5 mm to left. Scattered subarachnoid hemorrhage CTA head & neck Negative for spot sign, intracranial aneurysm, or evidence of vascular malformation. Right sided subdural hematoma (estimated at 4 mm) suspected in addition to intra-axial and subarachnoid blood. Repeat CT head 6/20  early AM: overall stable Right temporoparietal parenchymal hemorrhage with surrounding vasogenic edema and mass effect, not significantly changed.  Midline shift is now 6 mm. Scattered subarachnoid hemorrhage, similar to prior. Status post [facility]  reversal MRI  with and without contrast: Stable right temporoparietal IPH and associated vasogenic edema. Mass effect and midline shift similar to prior study, measuring 6 mm. Multiple punctate acute/subacute bilateral cortical infarcts 6/23- CT Head- Stable hematoma, with MLS 7mm CT repeat 6/26 right temporal IPH similar in size with vasogenic edema and 6 mm midline shift, subarachnoid hemorrhage less prominent 2D Echo: EF 50 to 55%, global hypokinesis, degenerative mitral valve LDL 54 in 08/2023 HgbA1c 5.6 in 08/2023 VTE prophylaxis - SCDs Xarelto  (rivaroxaban ) daily prior to admission, continue No antithrombotic due to ICH Therapy recommendations:  CIR Disposition: Pending  Cerebral Edema Repeat CT 6/23 overall stable hematoma, and slight increase cerebral edema with MLS 7mm CT repeat 6/26 demonstrates stable IPH NS consulted, no surgical intervention at this time. 3% bolus of 250 given 6/19 3% hypertonic saline stopped 6/21 ->  on NS @ 50-> off Na 138-134-136-144-150-148-154-151 -149- 149-148 Allow Na gradually trending [name]  Seizure like activity Incontinence with shaking at onset per report No seizure since admission LTM EEG no seizure-like activity seen, discontinued 6/20 S/p keppra  load Keppra  7 days course completed  Acute Respiratory Failure ?Aspiration Pneumonia Intubated 6/19 d/t mental status, inability to protect airway Extubated 6/20 afternoon -> 6L [place] -> 15L [facility]->re-intubated 6/23 B Cx showed 1/4 steph hemolyticus, concerning for contamination, repeat blood cultures NGTD WBC 4.6->5.7->5.8->5.4 CXR with New right perihilar airspace disease S/p bronch 6/25, no mucous plugs On unasyn    Atrial fibrillation Home Meds: Xarelto   20 mg daily Continue telemetry monitoring No anticoagulation now due to ICH  Alcohol withdrawal Alcohol abuse Intermittent agitation Continue CIWA protocol On precedex  -> off Phenobarbital  taper discontinued  Hypertension Home meds: Amlodipine  5 mg daily Stable now BP goal < 160  Hyperlipidemia Home meds: Lipitor  80 mg LDL 54, goal < 70 Consider to resume statin on discharge  Substance Abuse UDS positive for THC       Ready to quit? N/A TOC consult for cessation placed  Dysphagia Patient has post-stroke dysphagia, SLP consulted Now n.p.o. On TF @ 55  Other Stroke Risk Factors Obesity, Body mass index is 29.93 kg/m., BMI >/= 30 associated with increased stroke risk, recommend weight loss, diet and exercise as appropriate Former smoker  Other Active Problems Right foot injury, s/p recent graft and recent infection Was on doxycycline , now Unasyn  WBC 4.7-4.6-5.7-5.8-5.4 MRI right foot reveals osteomyelitis of second toe, amputation planned for Friday pending vascular surgery evaluation Repeat blood culture NGTD CKD3b, creatinine 1.50--1.35--1.58--1.50--1.38--1.36--1.58, on [facility] day # 7  Patient seen and examined by NP/APP with MD. MD to update note as needed.   Jonathon [name] Jonathon [name] , MSN, AGACNP-BC [facility] See Amion for schedule and pager information 10/22/2023 11:02 AM  ATTENDING NOTE: I reviewed above note and agree with the assessment and plan. Pt was seen and examined.   Daughter and wife are at the bedside, pt still intubated on vent, able to open eyes on voice but not following commands or tracking. Not consistent blinking to visual threat bilaterally, per RN, he wiggled toes on commands but not following any commands for me. No significant movement with painful stimuli. Had weaning trial this am but failed, will try again tomorrow. Plan for tomorrow R 2nd toe amputation.   For detailed assessment and plan, please refer to above as I  have made changes wherever appropriate.   Jonathon Cummins, MD PhD Stroke Neurology 10/22/2023 6:35 PM  This patient is critically ill due to ICH and cerebral edema, A-fib off AC now, osteomyelitis and at significant risk of neurological worsening, death form recurrent stroke, hemorrhagic admission, brain herniation, heart failure, sepsis, septic shock. This patient's care requires constant monitoring of vital signs, hemodynamics, respiratory and cardiac monitoring, review of multiple databases, neurological assessment, discussion with family, other specialists and medical decision making of high complexity. I spent 35 minutes of neurocritical care time in the care of this patient. Discussed with CCM Dr. Neda. I had long discussion with wife and daughter at bedside, updated pt current condition, treatment plan and potential prognosis, and answered all the questions. They expressed understanding and appreciation.

## 2023-10-22 NOTE — Progress Notes (Signed)
 PT Cancellation Note  Patient Details Name: Rahiem Schellinger MRN: [id number] DOB: 10/04/1950   Cancelled Treatment:    Reason Eval/Treat Not Completed: (P) Patient not medically ready. RN requesting hold off on PT for today. Will plan to follow-up another day as able.    Theo Ferretti, PT, DPT Acute Rehabilitation Services  Office: [phone]    Theo CHRISTELLA Ferretti 10/22/2023, 12:34 PM

## 2023-10-22 NOTE — Progress Notes (Signed)
 Pt transported to/from CT with RN on the vent without complications.

## 2023-10-22 NOTE — Progress Notes (Signed)
 ABI completed. Prelim report is under CV Proc. Hanish Laraia, RVT

## 2023-10-22 NOTE — Progress Notes (Signed)
 OT Cancellation Note  Patient Details Name: Nosson Wender MRN: [id number] DOB: November 21, 1950   Cancelled Treatment:    Reason Eval/Treat Not Completed: Patient not medically ready;Patient's level of consciousness Spoke with nursing. Pt is no long sedated but is not appropriate to participate with therapy at this time. Will follow up with pt tomorrow.   Leita Howell, OTR/L,CBIS  Supplemental OT - [facility] and [facility] Secure Chat Preferred   10/22/2023, 2:38 PM

## 2023-10-22 NOTE — Progress Notes (Signed)
 SLP Cancellation Note  Patient Details Name: Jonathon [name] MRN: [id number] DOB: 08/28/1950   Cancelled treatment:       Reason Eval/Treat Not Completed: Patient not medically ready   [name] 10/22/2023, 10:48 AM

## 2023-10-22 NOTE — Progress Notes (Addendum)
 NAME:  Jonathon [name], MRN:  [id number], DOB:  1951-04-19, LOS: 7 ADMISSION DATE:  10/15/2023, CONSULTATION DATE: 10/15/2023 REFERRING MD: EDP, CHIEF COMPLAINT: Intracerebral hemorrhage  History of Present Illness:  73 year old man who was found down by wife poorly responsive required intubation and transportation to the emergency.  CT scan revealed a right intraparenchymal hemorrhage with other areas of injury.  He is currently intubated in atrial fibrillation ventricular response on amiodarone  drip being treated with fentanyl  and propofol  for tube tolerance.  He has been seen by neurosurgery and neurology.  Hyperatremia treatment we await the final results of CT scan of the head to see if there is any surgical options.  Pertinent Medical History:   Past Medical History:  Diagnosis Date   Anemia due to blood loss, acute 2014   2nd GIB   Anxiety    CKD (chronic kidney disease), stage III (HCC) 2014   Diabetes mellitus without complication (HCC)    Gout    High cholesterol    Hypertension 2014   Hypothyroid    Neuropathy    PUD (peptic ulcer disease) 2014    Significant Hospital Events: Including procedures, antibiotic start and stop dates in addition to other pertinent events   6/19 Admitted w/ ICH felt 2/2 HTN and c/b SDH p/ fall. Started AEDs. HTNS. Seen by n surg felt unlikely that surg would improve outcome. Seen by neuro. Placed on clevi. Got AC reversed in ER. Unasyn  started for asp 6/20 weaning CT imaging stable ECHO ordered. 1/2 BC + felt contamination but does bring endocarditis into question. Extubated with increased agitation post-extubation, placed on precedex . Patient does drink alcohol every night, placed on CIWA 6/21 Echo with EF 50-55%, low normal LV function +global hypokinesis, degenerative MV without MR, no evidence of valvular vegetations. 6/23: reintubated after poor responsiveness, gurgling and desaturations 6/24: MRI today for R foot osteo r/o  6/25: MRI  yesterday with osteo - adding linezolid. Will consult podiatry. Bronch completed. Waking up.  6/26: SBT with apnea. ABI today for poss amputation R toe 6/27.   Interim History / Subjective:  NAEON. Tries to open eyes to voice. Wiggles toes to command. Does not move upper extremities. Apnea on SBT even with command. Will try again later. Expect extubation after amputation. BAL from yesterday with no organisms.  Objective:   Blood pressure (!) 153/87, pulse (!) 113, temperature 98.6 F (37 C), temperature source Esophageal, resp. rate (!) 0, height 6' (1.829 m), weight 100.1 kg, SpO2 100%.    Vent Mode: PRVC FiO2 (%):  [40 %] 40 % Set Rate:  [16 bmp] 16 bmp Vt Set:  [620 mL] 620 mL PEEP:  [5 cmH20] 5 cmH20 Plateau Pressure:  [14 cmH20-17 cmH20] 16 cmH20   Intake/Output Summary (Last 24 hours) at 10/22/2023 0845 Last data filed at 10/22/2023 0800 Gross per 24 hour  Intake 1646.74 ml  Output 855 ml  Net 791.74 ml   Filed Weights   10/20/23 0500 10/21/23 0500 10/22/23 0403  Weight: 97.2 kg 97.2 kg 100.1 kg   Physical Examination: General: Acutely ill-appearing older man, intubated HEENT: [place]/AT, anicteric sclera, PERRL3MM, dry mucous membranes. Neuro: wakes to voice Responds to verbal stimuli. Wiggles toes to command. Does not move upper extremities. +gag/cough  CV: rrr, no m/r/g PULM: CTAB, vented on minimal settings. Peak/plat okay. Attempted SBT with apnea  GI: Soft, nontender, nondistended. Normoactive bowel sounds. Extremities: trace edema; right 2nd toe swollen, hot with eschar, no surrounding erythema  Resolved problem List:  Assessment and Plan:  R intraparenchymal hemorrhage involving right temporal lobe with vasogenic edema, (R) SDH AND also adjacent scattered subarachnoid hemorrhages primarily within the adjacent cerebral area.  Felt primary injury d/t HTN on DOAC. DOAC reversed in ED. GCS 6 on arrival. CT Head 6/20 with multifocal ICH (stable since 6/19), mixed density R  hemisphere intraaxial hematoma, R-sided SDH, scattered bilateral SAH, stable intracranial mass effect with 6-22mm R to L midline shift. MRI Brain 6/21 with stable R temporoparietal IPH with associated vasogenic edema/mass effect and shift (6mm), multiple punctate bilateral infarcts. LTM EEG negative for seizures. - NSGY/Stroke teams following, appreciate recommendations - Per NSGY, no role for surgical intervention at this juncture - Goal SBP < 160 - AEDs per Neuro - Frequent neurochecks - Neuroprotective measures: HOB > 30 degrees, normoglycemia, normothermia, electrolytes WNL - PT/OT/SLP as able  Acute Hypoxemic Respiratory Failure Ventilator management s/p ICH and ineffective airway clearance Concern for aspiration PNA Extubated 6/20; reintubated 6/23. Bronch 6/25 after mucous plug. Was lavaged out by RRT. Bronch looked clear.  - 6/24 trach aspirate with GPC - on Unasyn   - BAL sent 6/25 - no organisms  - SAT/SBT daily - anticipate with failed SBT this morning that he will remain intubated until s/p OR for amputation - full mechanical vent support, lung protective ventilation 6-8cc/kg Vt - VAP and PAD bundle in place - off sedation for neuro exam and working toward extubation - titrate FiO2 to sat goal >92  - maintain peak/plats <30, driving pressures <84    Acute Metabolic Encephalopathy Multifactorial in the setting of ICH/SDH, ?alcohol withdrawal. - Correct metabolic derangements as able - Avoid sedating agents, though this is difficult in the setting of above - PAD protocol as above  Hyperchloremia  Borderline hypernatremia  - start free water 200cc q8h  - trend   Alcohol Withdrawal Patient has multiple liquor drinks nightly. 6/26, now 6 days into admission. Completed phenobarbital  taper. CIWA removed. No further withdrawal needs at this tim - Continue thiamine /folate/multivitamin  Ectopy and tachycardia History of Afib (on Xarelto ) Previously required amiodarone  gtt. -  Cardiac monitoring - Optimize electrolytes for K > 4, Mg > 2 - heparin  q8h dvt ppx  - Low threshold to resume amiodarone  if ongoing arrhythmias/ectopy  Possible bacteremia 1/2 BC + staph organism (not SA), likely contamination. Echo 6/21 with EF 50-55%, low normal LV function +global hypokinesis, degenerative MV without MR, no evidence of valvular vegetations. Do not feel he needs TEE at this time. Additionally, has osteomyelitis from R 2nd digit.  - continue unasyn  and linezolid  - source control with amputation tentatively 6/27  - Trend WBC, fever curve - BCX 6/23 negative, f/u trach aspirate and BAL  Poorly controlled hypertension - Goal SBP < 160 - Hydralazine , labetalol  PRN  Diabetes mellitus with poor control - SSI - CBGs Q4H - Goal CBG 140-180  CKD Stage 3b - GFR 30 to 44 Fluid and electrolyte imbalance: hypomagnesemia and hypophosphatemia  - Trend BMP - Replete electrolytes as indicated - Monitor I&Os - Avoid nephrotoxic agents as able - Ensure adequate renal perfusion  Peripheral vascular disease with neuropathy and loss of right great toe fourth toe as well as debridement and recent skin graft of 2nd toe on right foot MRI 6/24 with osteomyelitis. Podiatry evaluated 6/25 by Dr. Malvin.  - continue linezolid  - ABI today - likely OR for amputation 6/27  Best Practice (right click and Reselect all SmartList Selections daily)   Diet/type: tubefeeds and NPO DVT  prophylaxis: SCD  Pressure ulcer(s): present on admission  GI prophylaxis: PPI Lines: N/A Foley:  Yes, and it is still needed Code Status:  full code Last date of multidisciplinary goals of care discussion: [Bedside 6/25 with family]  Critical care time:   The patient is critically ill with multiple organ system failure and requires high complexity decision making for assessment and support, frequent evaluation and titration of therapies, advanced monitoring, review of radiographic studies and  interpretation of complex data.   Critical Care Time devoted to patient care services, exclusive of separately billable procedures, described in this note is 32 minutes.  Tinnie FORBES Furth, PA-C [facility] 10/22/23 8:45 AM  Please see [email or web address] for pager details.  From 7A-7P if no response, please call [phone] After hours, please call ELink [phone]

## 2023-10-23 ENCOUNTER — Inpatient Hospital Stay (HOSPITAL_COMMUNITY): Admitting: Anesthesiology

## 2023-10-23 ENCOUNTER — Inpatient Hospital Stay (HOSPITAL_COMMUNITY)

## 2023-10-23 ENCOUNTER — Ambulatory Visit: Admitting: Podiatry

## 2023-10-23 ENCOUNTER — Encounter (HOSPITAL_COMMUNITY): Admission: EM | Disposition: E | Payer: Self-pay | Source: Home / Self Care | Attending: Pulmonary Disease

## 2023-10-23 DIAGNOSIS — M86171 Other acute osteomyelitis, right ankle and foot: Secondary | ICD-10-CM

## 2023-10-23 DIAGNOSIS — N183 Chronic kidney disease, stage 3 unspecified: Secondary | ICD-10-CM | POA: Diagnosis not present

## 2023-10-23 DIAGNOSIS — J690 Pneumonitis due to inhalation of food and vomit: Secondary | ICD-10-CM

## 2023-10-23 DIAGNOSIS — I480 Paroxysmal atrial fibrillation: Secondary | ICD-10-CM | POA: Diagnosis not present

## 2023-10-23 DIAGNOSIS — I129 Hypertensive chronic kidney disease with stage 1 through stage 4 chronic kidney disease, or unspecified chronic kidney disease: Secondary | ICD-10-CM

## 2023-10-23 DIAGNOSIS — E1169 Type 2 diabetes mellitus with other specified complication: Secondary | ICD-10-CM

## 2023-10-23 DIAGNOSIS — G936 Cerebral edema: Secondary | ICD-10-CM | POA: Diagnosis not present

## 2023-10-23 DIAGNOSIS — E870 Hyperosmolality and hypernatremia: Secondary | ICD-10-CM

## 2023-10-23 DIAGNOSIS — M869 Osteomyelitis, unspecified: Secondary | ICD-10-CM | POA: Diagnosis not present

## 2023-10-23 DIAGNOSIS — I609 Nontraumatic subarachnoid hemorrhage, unspecified: Secondary | ICD-10-CM | POA: Diagnosis not present

## 2023-10-23 DIAGNOSIS — R29721 NIHSS score 21: Secondary | ICD-10-CM | POA: Diagnosis not present

## 2023-10-23 DIAGNOSIS — I611 Nontraumatic intracerebral hemorrhage in hemisphere, cortical: Secondary | ICD-10-CM | POA: Diagnosis not present

## 2023-10-23 DIAGNOSIS — S065XAA Traumatic subdural hemorrhage with loss of consciousness status unknown, initial encounter: Secondary | ICD-10-CM | POA: Diagnosis not present

## 2023-10-23 DIAGNOSIS — J9601 Acute respiratory failure with hypoxia: Secondary | ICD-10-CM | POA: Diagnosis not present

## 2023-10-23 DIAGNOSIS — J9690 Respiratory failure, unspecified, unspecified whether with hypoxia or hypercapnia: Secondary | ICD-10-CM

## 2023-10-23 HISTORY — PX: AMPUTATION TOE: SHX6595

## 2023-10-23 LAB — CBC
HCT: 42.2 % (ref 39.0–52.0)
Hemoglobin: 13.7 g/dL (ref 13.0–17.0)
MCH: 31.1 pg (ref 26.0–34.0)
MCHC: 32.5 g/dL (ref 30.0–36.0)
MCV: 95.9 fL (ref 80.0–100.0)
Platelets: 80 10*3/uL — ABNORMAL LOW (ref 150–400)
RBC: 4.4 MIL/uL (ref 4.22–5.81)
RDW: 17.6 % — ABNORMAL HIGH (ref 11.5–15.5)
WBC: 5.4 10*3/uL (ref 4.0–10.5)
nRBC: 0 % (ref 0.0–0.2)

## 2023-10-23 LAB — BASIC METABOLIC PANEL WITH GFR
Anion gap: 12 (ref 5–15)
BUN: 32 mg/dL — ABNORMAL HIGH (ref 8–23)
CO2: 26 mmol/L (ref 22–32)
Calcium: 9.4 mg/dL (ref 8.9–10.3)
Chloride: 109 mmol/L (ref 98–111)
Creatinine, Ser: 1.57 mg/dL — ABNORMAL HIGH (ref 0.61–1.24)
GFR, Estimated: 46 mL/min — ABNORMAL LOW (ref >60)
Glucose, Bld: 282 mg/dL — ABNORMAL HIGH (ref 70–99)
Potassium: 4 mmol/L (ref 3.5–5.1)
Sodium: 147 mmol/L — ABNORMAL HIGH (ref 135–145)

## 2023-10-23 LAB — GLUCOSE, CAPILLARY
Glucose-Capillary: 160 mg/dL — ABNORMAL HIGH (ref 70–99)
Glucose-Capillary: 194 mg/dL — ABNORMAL HIGH (ref 70–99)
Glucose-Capillary: 203 mg/dL — ABNORMAL HIGH (ref 70–99)
Glucose-Capillary: 232 mg/dL — ABNORMAL HIGH (ref 70–99)
Glucose-Capillary: 258 mg/dL — ABNORMAL HIGH (ref 70–99)
Glucose-Capillary: 267 mg/dL — ABNORMAL HIGH (ref 70–99)

## 2023-10-23 LAB — MAGNESIUM: Magnesium: 1.9 mg/dL (ref 1.7–2.4)

## 2023-10-23 SURGERY — AMPUTATION, TOE
Anesthesia: Monitor Anesthesia Care | Site: Toe | Laterality: Right

## 2023-10-23 MED ORDER — LACTATED RINGERS IV SOLN: INTRAVENOUS | Status: DC | PRN

## 2023-10-23 MED ORDER — LIDOCAINE HCL 1 % IJ SOLN
INTRAMUSCULAR | Status: DC | PRN
  Administered 2023-10-23: 10 mL via INTRAMUSCULAR

## 2023-10-23 MED ORDER — LIDOCAINE HCL (PF) 1 % IJ SOLN
INTRAMUSCULAR | Status: AC
  Filled 2023-10-23: qty 10

## 2023-10-23 MED ORDER — METOPROLOL TARTRATE 25 MG/10 ML ORAL SUSPENSION
12.5000 mg | Freq: Once | ORAL | Status: AC
  Administered 2023-10-23: 12.5 mg
  Filled 2023-10-23: qty 10

## 2023-10-23 MED ORDER — AMIODARONE HCL IN DEXTROSE 360-4.14 MG/200ML-% IV SOLN
30.0000 mg/h | INTRAVENOUS | Status: AC
  Administered 2023-10-23 – 2023-10-27 (×6): 30 mg/h via INTRAVENOUS
  Filled 2023-10-23 (×7): qty 200

## 2023-10-23 MED ORDER — INSULIN ASPART 100 UNIT/ML IJ SOLN
5.0000 [IU] | INTRAMUSCULAR | Status: DC
Start: 2023-10-23 — End: 2023-10-25
  Administered 2023-10-23 – 2023-10-25 (×12): 5 [IU] via SUBCUTANEOUS

## 2023-10-23 MED ORDER — AMIODARONE HCL IN DEXTROSE 360-4.14 MG/200ML-% IV SOLN
60.0000 mg/h | INTRAVENOUS | Status: AC
  Administered 2023-10-23 (×2): 60 mg/h via INTRAVENOUS
  Filled 2023-10-23: qty 400

## 2023-10-23 MED ORDER — VANCOMYCIN HCL 500 MG IV SOLR
INTRAVENOUS | Status: AC
  Filled 2023-10-23: qty 10

## 2023-10-23 MED ORDER — SODIUM CHLORIDE 0.9 % IR SOLN
Status: DC | PRN
  Administered 2023-10-23: 1000 mL

## 2023-10-23 MED ORDER — BUPIVACAINE HCL (PF) 0.5 % IJ SOLN
INTRAMUSCULAR | Status: AC
  Filled 2023-10-23: qty 10

## 2023-10-23 MED ORDER — AMIODARONE IV BOLUS ONLY 150 MG/100ML
150.0000 mg | Freq: Once | INTRAVENOUS | Status: AC
  Administered 2023-10-23: 150 mg via INTRAVENOUS
  Filled 2023-10-23: qty 100

## 2023-10-23 MED ORDER — AMIODARONE LOAD VIA INFUSION
150.0000 mg | Freq: Once | INTRAVENOUS | Status: AC
  Administered 2023-10-23: 150 mg via INTRAVENOUS
  Filled 2023-10-23: qty 83.34

## 2023-10-23 SURGICAL SUPPLY — 36 items
BLADE AVERAGE 25X9 (BLADE) IMPLANT
BLADE SURG 10 STRL SS (BLADE) ×1 IMPLANT
BLADE SURG 15 STRL LF DISP TIS (BLADE) ×1 IMPLANT
BNDG COHESIVE 3X5 TAN ST LF (GAUZE/BANDAGES/DRESSINGS) ×1 IMPLANT
BNDG COMPR ESMARK 4X3 LF (GAUZE/BANDAGES/DRESSINGS) ×1 IMPLANT
BNDG ELASTIC 3INX 5YD STR LF (GAUZE/BANDAGES/DRESSINGS) ×1 IMPLANT
BNDG ELASTIC 4INX 5YD STR LF (GAUZE/BANDAGES/DRESSINGS) IMPLANT
BNDG GAUZE DERMACEA FLUFF 4 (GAUZE/BANDAGES/DRESSINGS) IMPLANT
CHLORAPREP W/TINT 26 (MISCELLANEOUS) IMPLANT
DRSG ADAPTIC 3X8 NADH LF (GAUZE/BANDAGES/DRESSINGS) IMPLANT
DRSG XEROFORM 1X8 (GAUZE/BANDAGES/DRESSINGS) IMPLANT
ELECTRODE REM PT RTRN 9FT ADLT (ELECTROSURGICAL) ×1 IMPLANT
GAUZE PAD ABD 8X10 STRL (GAUZE/BANDAGES/DRESSINGS) IMPLANT
GAUZE SPONGE 2X2 STRL 8-PLY (GAUZE/BANDAGES/DRESSINGS) IMPLANT
GAUZE SPONGE 4X4 12PLY STRL (GAUZE/BANDAGES/DRESSINGS) ×1 IMPLANT
GAUZE STRETCH 2X75IN STRL (MISCELLANEOUS) ×1 IMPLANT
GAUZE XEROFORM 1X8 LF (GAUZE/BANDAGES/DRESSINGS) ×1 IMPLANT
GLOVE BIO SURGEON STRL SZ7.5 (GLOVE) ×1 IMPLANT
GLOVE BIOGEL PI IND STRL 7.5 (GLOVE) ×1 IMPLANT
GOWN STRL REUS W/ TWL LRG LVL3 (GOWN DISPOSABLE) ×2 IMPLANT
KIT BASIN OR (CUSTOM PROCEDURE TRAY) ×1 IMPLANT
NDL HYPO 25X1 1.5 SAFETY (NEEDLE) ×1 IMPLANT
NEEDLE HYPO 25X1 1.5 SAFETY (NEEDLE) ×1 IMPLANT
PACK ORTHO EXTREMITY (CUSTOM PROCEDURE TRAY) ×1 IMPLANT
PADDING CAST ABS COTTON 4X4 ST (CAST SUPPLIES) ×2 IMPLANT
SET HNDPC FAN SPRY TIP SCT (DISPOSABLE) IMPLANT
SPIKE FLUID TRANSFER (MISCELLANEOUS) IMPLANT
STOCKINETTE 4X48 STRL (DRAPES) IMPLANT
SUT ETHILON 3 0 FSLX (SUTURE) IMPLANT
SUT PROLENE 3 0 PS 2 (SUTURE) IMPLANT
SUT PROLENE 4 0 PS 2 18 (SUTURE) IMPLANT
SYR CONTROL 10ML LL (SYRINGE) ×1 IMPLANT
TUBE CONNECTING 12X1/4 (SUCTIONS) IMPLANT
UNDERPAD 30X36 HEAVY ABSORB (UNDERPADS AND DIAPERS) ×1 IMPLANT
WATER STERILE IRR 1000ML POUR (IV SOLUTION) ×1 IMPLANT
YANKAUER SUCT BULB TIP NO VENT (SUCTIONS) IMPLANT

## 2023-10-23 NOTE — Progress Notes (Signed)
 SLP Cancellation Note  Patient Details Name: [name] MRN: [id number] DOB: [birth year]   Cancelled treatment:       Reason Eval/Treat Not Completed: Patient not medically ready (Pt remains intubated and sedated. Please reconsult, as appropriate.)  Delon Bangs, M.S., CCC-SLP Speech-Language Pathologist Secure Chat Preferred  O: [phone]  Delon CHRISTELLA Bangs 10/23/2023, 9:41 AM

## 2023-10-23 NOTE — Anesthesia Preprocedure Evaluation (Addendum)
 Anesthesia Evaluation   Patient unresponsive    Reviewed: Allergy & Precautions, Patient's Chart, lab work & pertinent test results  Airway Mallampati: Intubated   Neck ROM: Full    Dental no notable dental hx.    Pulmonary former smoker      + intubated    Cardiovascular hypertension, Pt. on medications and Pt. on home beta blockers + Peripheral Vascular Disease and +CHF   Rhythm:Regular Rate:Normal      1. Limited for afib and r/o endocarditis  2. Left ventricular ejection fraction, by estimation, is 50 to 55%. Left ventricular ejection fraction by 2D MOD biplane is 51.8 %. The left ventricle has low normal function. The left ventricle demonstrates global hypokinesis.  3. The mitral valve is degenerative. No evidence of mitral valve regurgitation. Moderate mitral annular calcification.  4. The aortic valve is tricuspid. Aortic valve regurgitation is not visualized. No aortic stenosis is present.   Comparison(s): No significant change from prior study. 10/01/2023: LVEF 50-55%.   Conclusion(s)/Recommendation(s): No evidence of valvular vegetations on this transthoracic echocardiogram. Consider a transesophageal echocardiogram to exclude infective endocarditis if clinically indicated.      Neuro/Psych   Anxiety        GI/Hepatic Neg liver ROS, PUD,GERD  Medicated,,  Endo/Other  diabetesHypothyroidism    Renal/GU CRFRenal disease  negative genitourinary   Musculoskeletal Toe osteomyelitis    Abdominal Normal abdominal exam  (+)   Peds  Hematology  (+) Blood dyscrasia, anemia Lab Results      Component                Value               Date                      WBC                      5.4                 10/23/2023                HGB                      13.7                10/23/2023                HCT                      42.2                10/23/2023                MCV                      95.9                 10/23/2023                PLT                      80 (L)              10/23/2023              Anesthesia Other Findings   Reproductive/Obstetrics  Anesthesia Physical Anesthesia Plan  ASA: 4  Anesthesia Plan: MAC   Post-op Pain Management:    Induction: Intravenous  PONV Risk Score and Plan: 1 and Ondansetron , Dexamethasone , Propofol  infusion and Treatment may vary due to age or medical condition  Airway Management Planned: Simple Face Mask and Nasal Cannula  Additional Equipment: None  Intra-op Plan:   Post-operative Plan:   Informed Consent: I have reviewed the patients History and Physical, chart, labs and discussed the procedure including the risks, benefits and alternatives for the proposed anesthesia with the patient or authorized representative who has indicated his/her understanding and acceptance.     Dental advisory given and Consent reviewed with POA  Plan Discussed with: CRNA  Anesthesia Plan Comments:        Anesthesia Quick Evaluation

## 2023-10-23 NOTE — Progress Notes (Addendum)
 NAME:  Jonathon [name], MRN:  [id number], DOB:  [birth year], LOS: 8 ADMISSION DATE:  10/15/2023, CONSULTATION DATE: 10/15/2023 REFERRING MD: EDP, CHIEF COMPLAINT: Intracerebral hemorrhage  History of Present Illness:  73 year old man who was found down by wife poorly responsive required intubation and transportation to the emergency.  CT scan revealed a right intraparenchymal hemorrhage with other areas of injury.  He is currently intubated in atrial fibrillation ventricular response on amiodarone  drip being treated with fentanyl  and propofol  for tube tolerance.  He has been seen by neurosurgery and neurology.  Hyperatremia treatment we await the final results of CT scan of the head to see if there is any surgical options.  Pertinent Medical History:   Past Medical History:  Diagnosis Date   Anemia due to blood loss, acute 2014   2nd GIB   Anxiety    CKD (chronic kidney disease), stage III (HCC) 2014   Diabetes mellitus without complication (HCC)    Gout    High cholesterol    Hypertension 2014   Hypothyroid    Neuropathy    PUD (peptic ulcer disease) 2014    Significant Hospital Events: Including procedures, antibiotic start and stop dates in addition to other pertinent events   6/19 Admitted w/ ICH felt 2/2 HTN and c/b SDH p/ fall. Started AEDs. HTNS. Seen by n surg felt unlikely that surg would improve outcome. Seen by neuro. Placed on clevi. Got AC reversed in ER. Unasyn  started for asp 6/20 weaning CT imaging stable ECHO ordered. 1/2 BC + felt contamination but does bring endocarditis into question. Extubated with increased agitation post-extubation, placed on precedex . Patient does drink alcohol every night, placed on CIWA 6/21 Echo with EF 50-55%, low normal LV function +global hypokinesis, degenerative MV without MR, no evidence of valvular vegetations. 6/23: reintubated after poor responsiveness, gurgling and desaturations 6/24: MRI today for R foot osteo r/o  6/25: MRI  yesterday with osteo - adding linezolid . Will consult podiatry. Bronch completed. Waking up.  6/26: SBT with apnea. ABI today for poss amputation R toe 6/27. Amio   Interim History / Subjective:   Afib RVR last night got an amio bolus    Objective:   Blood pressure (!) 154/105, pulse (!) 128, temperature 99.3 F (37.4 C), resp. rate 16, height 6' (1.829 m), weight 100.1 kg, SpO2 100%.    Vent Mode: PRVC FiO2 (%):  [40 %] 40 % Set Rate:  [16 bmp] 16 bmp Vt Set:  [620 mL] 620 mL PEEP:  [5 cmH20] 5 cmH20 Plateau Pressure:  [16 cmH20] 16 cmH20   Intake/Output Summary (Last 24 hours) at 10/23/2023 1159 Last data filed at 10/23/2023 0800 Gross per 24 hour  Intake 1690.94 ml  Output 1825 ml  Net -134.06 ml   Filed Weights   10/20/23 0500 10/21/23 0500 10/22/23 0403  Weight: 97.2 kg 97.2 kg 100.1 kg   Physical Examination: General:critically ill appearing elderly M  HEENT: NCAT pink mm anicteric sclera ETT secure  Neuro: Not following commands. Spontaneous eye opening  CV: irir s1s2  PULM: Mechanically ventilated, clear  HP:dnqu  Extremities: R second toe w eschar   Resolved problem List:   Assessment and Plan:    R IPH with brain compression  Scattered SAH  P -cont AEDs -SBP goal < 160  -supportive therapies  as able -neuro protective measures - NSGY/Stroke teams following, appreciate recommendations -if he isnt waking up through the weekend plan for MRI brain  -minimize CNS depressing meds  -not  on nimotop, TCDs etc. Will clarify w team.   Acute hypoxic resp failure Aspiration PNA  P -SBT after OR  -unasyn  + linezolid  (for osteo)   PVD, osteomyelitis P -OR for amputation w podiatry 6/27 -linezolid    Suspected blood contaminant  1/2 BC + staph organism (not SA), likely contamination. P -follow cx -abx as above for aspiration pna and osteo   Afib RVR Chronic AC (xarelto )  HTN  P - start amio gtt  - Optimize electrolytes for K > 4, Mg > 2 - heparin   q8h dvt ppx  -- no systemic AC w bleed -SBP < 160. PRN Hydral, labetalol  SCH metop -- post op can eval possibly incr metop   CKD 3b  Hypernatremia, mild  P -follow BMP -cont FWF  EtOH use  - Continue thiamine /folate/multivitamin  Diabetes mellitus with poor control -rSSI +5u enovolog very 4 hours    DNR status   Best Practice (right click and Reselect all SmartList Selections daily)   Diet/type: tubefeeds and NPO DVT prophylaxis: SCD  Pressure ulcer(s): present on admission  GI prophylaxis: PPI Lines: N/A Foley:  Yes, and it is still needed Code Status:  DNR Last date of multidisciplinary goals of care discussion: [Bedside 6/25 with family]  CRITICAL CARE Performed by: Ronnald FORBES Gave   Total critical care time: 42 minutes  Critical care time was exclusive of separately billable procedures and treating other patients. Critical care was necessary to treat or prevent imminent or life-threatening deterioration.  Critical care was time spent personally by me on the following activities: development of treatment plan with patient and/or surrogate as well as nursing, discussions with consultants, evaluation of patient's response to treatment, examination of patient, obtaining history from patient or surrogate, ordering and performing treatments and interventions, ordering and review of laboratory studies, ordering and review of radiographic studies, pulse oximetry and re-evaluation of patient's condition.  Ronnald Gave MSN, AGACNP-BC [facility] Amion for pager 10/23/2023, 11:59 AM

## 2023-10-23 NOTE — Progress Notes (Addendum)
 STROKE TEAM PROGRESS NOTE    SIGNIFICANT HOSPITAL EVENTS  6/19: level 2 trauma after being found on the ground incontinent and shaking.  HR 200s with wide complex QRS, amio bolus given and drip started.  GCS 6. Intubated on arrival.  CT Head shows an acute intraparenchymal hematoma within the right lateral temporal lobe with surrounding vasogenic edema and 5 mm midline shift and scattered subarachnoid hemorrhages.  PTA xarelto  reversed with [facility] on Admission 22. 6/20: Repeat CT head early this a.m. stable. Extubated in afternoon. 6/21: MRI stable IPH, multiple acute/subacute cortical infarcts seen. 3% stopped.  6/23- re intubated 6/25-found to have osteomyelitis of right second toe, amputation planned for Friday, bronchoscopy performed and cortrak inserted  INTERIM HISTORY/SUBJECTIVE Patient is seen in his room with no family at the bedside. Currently on no sedation. Blinks and opens eyes but no commands today. Plan for OR today for amputation of his toe.  If no improvement in neuro exam over the weekend, repeat MRI brain wo  OBJECTIVE  CBC    Component Value Date/Time   WBC 5.4 10/23/2023 0631   RBC 4.40 10/23/2023 0631   HGB 13.7 10/23/2023 0631   HGB 17.4 08/12/2023 1622   HCT 42.2 10/23/2023 0631   HCT 53.7 (H) 08/12/2023 1622   PLT 80 (L) 10/23/2023 0631   PLT 127 (L) 08/12/2023 1622   MCV 95.9 10/23/2023 0631   MCV 93 08/12/2023 1622   MCH 31.1 10/23/2023 0631   MCHC 32.5 10/23/2023 0631   RDW 17.6 (H) 10/23/2023 0631   RDW 13.1 08/12/2023 1622   LYMPHSABS 0.9 08/31/2023 1632   LYMPHSABS 0.8 08/04/2022 1459   MONOABS 0.3 08/31/2023 1632   EOSABS 0.0 08/31/2023 1632   EOSABS 0.1 08/04/2022 1459   BASOSABS 0.0 08/31/2023 1632   BASOSABS 0.0 08/04/2022 1459    BMET    Component Value Date/Time   NA 147 (H) 10/23/2023 0631   NA 138 08/31/2023 1632   K 4.0 10/23/2023 0631   CL 109 10/23/2023 0631   CO2 26 10/23/2023 0631   GLUCOSE 282 (H) 10/23/2023  0631   BUN 32 (H) 10/23/2023 0631   BUN 15 08/31/2023 1632   CREATININE 1.57 (H) 10/23/2023 0631   CALCIUM  9.4 10/23/2023 0631   EGFR 51 (L) 08/31/2023 1632   GFRNONAA 46 (L) 10/23/2023 0631    IMAGING past 24 hours VAS US  ABI WITH/WO TBI Result Date: 10/22/2023  LOWER EXTREMITY DOPPLER STUDY Patient Name:  Jonathon [name]  Date of Exam:   10/22/2023 Medical Rec #: [id number]          Accession #:    [id number] Date of Birth: 1950/08/08         Patient Gender: M Patient Age:   31 years Exam Location:  [facility] Procedure:      VAS US  ABI WITH/WO TBI Referring Phys: MARSA STANDIFORD --------------------------------------------------------------------------------  Indications: Ulceration. High Risk Factors: Hypertension, hyperlipidemia, Diabetes, prior CVA. Other Factors: Normal ABI 06/28/2019.  Performing Technologist: Jimmye Scarce RVT  Examination Guidelines: A complete evaluation includes at minimum, Doppler waveform signals and systolic blood pressure reading at the level of bilateral brachial, anterior tibial, and posterior tibial arteries, when vessel segments are accessible. Bilateral testing is considered an integral part of a complete examination. Photoelectric Plethysmograph (PPG) waveforms and toe systolic pressure readings are included as required and additional duplex testing as needed. Limited examinations for reoccurring indications may be performed as noted.  ABI Findings: +--------+------------------+-----+---------+---------------+ Right  Rt Pressure (mmHg)IndexWaveform Comment         +--------+------------------+-----+---------+---------------+ Brachial                       triphasicIV              +--------+------------------+-----+---------+---------------+ PTA     208                    triphasic                +--------+------------------+-----+---------+---------------+ DP                             triphasicnoncompressible  +--------+------------------+-----+---------+---------------+ +--------+------------------+-----+---------+---------------+ Left    Lt Pressure (mmHg)IndexWaveform Comment         +--------+------------------+-----+---------+---------------+ Brachial                       triphasicIV              +--------+------------------+-----+---------+---------------+ PTA     219                    triphasic                +--------+------------------+-----+---------+---------------+ DP                             triphasicNoncompressible +--------+------------------+-----+---------+---------------+ Arterial wall calcification precludes accurate ankle pressures and ABIs.  Summary: Right: Resting right ankle-brachial index indicates noncompressible right lower extremity arteries. Left: Resting left ankle-brachial index indicates noncompressible left lower extremity arteries. *See table(s) above for measurements and observations.  Electronically signed by Debby Robertson on 10/22/2023 at 9:38:30 PM.    Final     Vitals:   10/23/23 0600 10/23/23 0700 10/23/23 0739 10/23/23 0800  BP:    (!) 154/105  Pulse: (!) 116 (!) 130  (!) 128  Resp: 16 16  16   Temp: 99.1 F (37.3 C) 99.1 F (37.3 C)  99.3 F (37.4 C)  TempSrc:      SpO2: 100% 100% 100% 100%  Weight:      Height:        PHYSICAL EXAM General: Elderly male, acutely ill CV: paroxysmal Afib on monitor Respiratory: Intubated, mechanically ventilated on full support   NEURO: (On no sedation) pupils equal round and reactive, opens eyes to voice but does not track examiner, blinks to threat bilaterally, does not follow commands or respond to noxious stimuli  Temp:  [99 F (37.2 C)-99.5 F (37.5 C)] 99.3 F (37.4 C) (06/27 0800) Pulse Rate:  [99-138] 128 (06/27 0800) Resp:  [3-18] 16 (06/27 0800) BP: (133-158)/(77-114) 154/105 (06/27 0800) SpO2:  [100 %] 100 % (06/27 0800) FiO2 (%):  [40 %] 40 % (06/27 0800)   Most Recent NIH:  21     ASSESSMENT/PLAN  [name] is a 73 y.o. male with  73 y.o. male with hx of anemia, afib on xarleto, anxiety, CKD stage 3, DM without complication, HLD, HTN, Hypothyroidism, PUD presenting as a level 2 trauma after being found on the ground incontinent and shaking. He was given 5mg  of versed  by EMS due to combativeness. He is also on doxycycline  for a toe infection. HR 200s with wide complex QRS, amio bolus given and drip started. GCS 6. Intubated on arrival. CT Head shows an acute intraparenchymal hematoma within the right lateral temporal lobe  with surrounding vasogenic edema and 5 mm midline shift and scattered subarachnoid hemorrhages. PTA xarelto  reversed with [facility] on Admission 22.  ICH:  right temporoparietal ICH with scattered SAH, etiology: hypertensive in the setting of DOAC CT Head Acute IPH within right lateral temporal lobe with surrounding vasogenic edema and mass effect, midline shift of approximately 5 mm to left. Scattered subarachnoid hemorrhage CTA head & neck Negative for spot sign, intracranial aneurysm, or evidence of vascular malformation. Right sided subdural hematoma (estimated at 4 mm) suspected in addition to intra-axial and subarachnoid blood. Repeat CT head 6/20 early AM: overall stable Right temporoparietal parenchymal hemorrhage with surrounding vasogenic edema and mass effect, not significantly changed.  Midline shift is now 6 mm. Scattered subarachnoid hemorrhage, similar to prior. Status post [facility]  reversal MRI  with and without contrast: Stable right temporoparietal IPH and associated vasogenic edema. Mass effect and midline shift similar to prior study, measuring 6 mm. Multiple punctate acute/subacute bilateral cortical infarcts 6/23- CT Head- Stable hematoma, with MLS 7mm CT repeat 6/26 right temporal IPH similar in size with vasogenic edema and 6 mm midline shift, subarachnoid hemorrhage less prominent Consider MRI repeat over the  weekend if no significant neuro improvement 2D Echo: EF 50 to 55%, global hypokinesis, degenerative mitral valve LDL 54 in 08/2023 HgbA1c 5.6 in 08/2023 VTE prophylaxis - SCDs Xarelto  (rivaroxaban ) daily prior to admission, continue No antithrombotic due to ICH Therapy recommendations:  CIR Disposition: Pending  Cerebral Edema Repeat CT 6/23 overall stable hematoma, and slight increase cerebral edema with MLS 7mm CT repeat 6/26 demonstrates stable IPH NS consulted, no surgical intervention at this time. 3% bolus of 250 given 6/19 3% hypertonic saline stopped 6/21 -> on NS @ 50-> off Na 138-134-136-144-150-148-154-151 -149- [phone] Allow Na gradually trending down  Seizure like activity Incontinence with shaking at onset per report No seizure since admission LTM EEG no seizure-like activity seen, discontinued 6/20 S/p keppra  load Keppra  7 days course completed  Acute Respiratory Failure ?Aspiration Pneumonia Intubated 6/19 d/t mental status, inability to protect airway Extubated 6/20 afternoon -> 6L [place] -> 15L NR[facility]->re-intubated 6/23 B Cx showed 1/4 steph hemolyticus, concerning for contamination, repeat blood cultures NGTD WBC 4.6->5.7->5.8->5.4 CXR with New right perihilar airspace disease S/p bronch 6/25, no mucous plugs On unasyn    Atrial fibrillation with RVR Home Meds: Xarelto  20 mg daily Continue telemetry monitoring 6/26 overnight RVR 150s received amio bolus, now off amio IV No anticoagulation now due to ICH  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 N/A TOC consult for cessation placed  Dysphagia Patient has post-stroke dysphagia, SLP consulted Now n.p.o. On TF @ 55 -> off 6/27 for OR  Other Stroke Risk Factors Obesity, Body mass index is 29.93 kg/m., BMI >/= 30 associated with increased stroke risk, recommend weight loss, diet and exercise as appropriate Former smoker  Other Active Problems CKD3b, creatinine 1.50--1.35--1.58--1.50--1.38--1.36--1.58-1.57, on TF  Hospital day # 8 Patient seen and  examined by NP/APP with MD. MD to update note as needed.   Jorene Last, DNP, FNP-BC [facility] Pager: [phone]  ATTENDING NOTE: I reviewed above note and agree with the assessment and plan. Pt was seen and examined.   No family at bedside.  Patient still intubated, open eyes to voice, not following commands, not moving extremities on pain stimulation, essentially unchanged from yesterday.  Pending right toe amputation today.  Sodium 147, no leukocytosis, creatinine stable.  On tube feeding repeat blood culture negative.  However, patient mental status is out of proportion to the ICH, consider repeat MRI over the weekend if mental status not improving.  Continue antibiotics.  For detailed assessment and plan, please refer to above as I have made changes wherever appropriate.   Ary Cummins, MD PhD Stroke Neurology 10/23/2023 5:28 PM  This patient is critically ill due to ICH and cerebral edema, A-fib off AC now, osteomyelitis and at significant risk of neurological worsening, death form recurrent stroke, hemorrhagic admission, brain herniation, heart failure, sepsis, septic shock. This patient's care requires constant monitoring of vital signs, hemodynamics, respiratory and cardiac monitoring, review of multiple databases, neurological assessment,  discussion with family, other specialists and medical decision making of high complexity. I spent 35 minutes of neurocritical care time in the care of this patient.

## 2023-10-23 NOTE — Op Note (Signed)
 Full Operative Report  Date of Operation: 1:18 PM, 10/23/2023   Patient: Jonathon [name] - 73 y.o. 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 Amputation right 2nd toe at MPJ level    Anesthesia: Monitor Anesthesia Care  Stoltzfus, Cordella SQUIBB, DO  Anesthesiologist: Dorethea Cordella SQUIBB, DO CRNA: Elby Raelene SAUNDERS, CRNA   Estimated Blood Loss: Minimal   Hemostasis: 1) Anatomical dissection, mechanical compression, electrocautery 2) No tourniquet was used during the procedure  Implants: * No implants in log *  Materials: prolene 3-0  Injectables: 1) Pre-operatively: 10 cc of 50:50 mixture 1%lidocaine  plain and 0.5% marcaine plain 2) Post-operatively: None   Specimens: - Pathology: Right 2nd toe - Microbiology: right 2nd toe bone for micro   Antibiotics: IV antibiotics given per schedule on the floor  Drains: None  Complications: Patient tolerated the procedure well without complication.   Operative findings: As below in detailed report  Indications for Procedure: Jonathon [name] presents to Malvin Marsa FALCON, DPM with a chief complaint of ulcer Right 2nd toe with concern for underlying osteomyelitis.  The patient has failed conservative treatments of various modalities. At this time the patient has elected to proceed with surgical correction. All alternatives, risks, and complications of the procedures were thoroughly explained to the patient. Patient exhibits appropriate understanding of all discussion points and informed consent was signed and obtained in the chart with no guarantees to surgical outcome given or implied.  Description of Procedure: Patient was brought to the operating room. Patient remained on their hospital bed in the supine position. A surgical timeout was performed and all members of the operating room, the procedure, and the surgical site were identified. anesthesia  occurred as per anesthesia record. Local anesthetic as previously described was then injected about the operative field in a local infiltrative block.  The operative lower extremity as noted above was then prepped and draped in the usual sterile manner. The following procedure then began.  Attention was directed to the 2nd digit on the RIGHT foot. A full-thickness incision encompassing the entire digit was made using a #15 blade. Dissection was carried down to bone. The toe was secured with a towel clamp, further dissected in its entirety, and disarticulated at the MPJ and passed to the back table as a gross specimen. This was then labled and sent to pathology. The bone was noted to be soft and eroded, and consistent with osteomyelitis and a bone culture was harvested from area underlying the ulceration. All remaining necrotic and devitalized soft tissue structures were visualized and dissected away using sharp and dull dissection. Care was taken to protect all neurovascular structures throughout the dissection. All bleeders were cauterized as necessary. The area was then flushed with copious amounts of sterile saline. Then using the suture materials previously described, the site was closed in anatomic layers and the skin was well approximated under minimal tension.  The surgical site was then dressed with xerform 4x4 guaze kerlix and ace wrap. The patient tolerated both the procedure and anesthesia well with vital signs stable throughout. The patient was transferred in good condition and all vital signs stable  from the OR to recovery under the discretion of anesthesia.  Condition: Vital signs stable, neurovascular status unchanged from preoperative   Surgical plan:  Expect clean margin, adequate bleeding noted intra operatively. Dressing to remain C/D/I will be change sometime early next week. WBAT in post op shoe when able.  The patient will be WBAT in a post op shoe. For now prevalon boots are  recommended due to his status.  to the operative limb until further instructed. The dressing is to remain clean, dry, and intact. Will continue to follow unless noted elsewhere.   Marsa Honour, DPM [facility]

## 2023-10-23 NOTE — Progress Notes (Signed)
 OT Cancellation Note  Patient Details Name: Jonathon [name] MRN: [id number] DOB: 1951/04/24   Cancelled Treatment:    Reason Eval/Treat Not Completed: Patient at procedure or test/ unavailable.  Will continue efforts as appropriate.  Sokha Craker D Ivianna Notch 10/23/2023, 12:47 PM 10/23/2023  RP, OTR/L  Acute Rehabilitation Services  Office:  [phone]

## 2023-10-23 NOTE — Progress Notes (Signed)
 History and Physical Interval Note:  10/23/2023 11:54 AM  Jonathon [name]  has presented today for surgery, with the diagnosis of osteomyelitis right second toe.  The various methods of treatment have been discussed with the patient and family. After consideration of risks, benefits and other options for treatment, the patient has consented to   Procedure(s) with comments: AMPUTATION, TOE (Right) - Right 2nd toe. Pt intubated as a surgical intervention.  The patient's history has been reviewed, patient examined, no change in status, stable for surgery.  I have reviewed the patient's chart and labs.  Questions were answered to the patient's satisfaction.     Marsa FALCON Elek Holderness

## 2023-10-23 NOTE — Progress Notes (Signed)
 PT Cancellation Note  Patient Details Name: Jonathon [name] MRN: [id number] DOB: April 15, 1951   Cancelled Treatment:    Reason Eval/Treat Not Completed: Patient at procedure or test/unavailable, off unit for R 2nd toe amputation. Will continue to follow and re-evaluate as appropriate.   [name], PT, DPT   Acute Rehabilitation Department Office [phone] Secure Chat Communication Preferred   [name] 10/23/2023, 12:53 PM

## 2023-10-23 NOTE — TOC Progression Note (Signed)
 Transition of Care [facility]) - Progression Note    Patient Details  Name: Jonathon [name] MRN: [id number] Date of Birth: 10-21-1950  [facility] CM/SW Contact  Inocente GORMAN Kindle, LCSW Phone Number: 10/23/2023, 9:27 AM  Clinical Narrative:    CSW continuing to follow. Patient remains intubated.    Expected Discharge Plan: IP Rehab Facility Barriers to Discharge: Continued Medical Work up  Expected Discharge Plan and Services In-house Referral: Clinical Social Work     Living arrangements for the past 2 months: Single Family Home                                       Social Determinants of Health (SDOH) Interventions SDOH Screenings   Food Insecurity: No Food Insecurity (05/30/2022)  Housing: Low Risk  (05/30/2022)  Transportation Needs: No Transportation Needs (05/30/2022)  Utilities: Not At Risk (05/30/2022)  Alcohol Screen: Low Risk  (05/30/2022)  Depression (PHQ2-9): Low Risk  (10/01/2023)  Financial Resource Strain: Low Risk  (05/30/2022)  Physical Activity: Inactive (05/30/2022)  Social Connections: Moderately Isolated (05/30/2022)  Stress: No Stress Concern Present (05/30/2022)  Tobacco Use: Medium Risk (10/21/2023)    Readmission Risk Interventions     No data to display

## 2023-10-23 NOTE — Progress Notes (Signed)
 eLink Physician-Brief Progress Note Patient Name: Jaysion Ramseyer DOB: 1950/08/13 MRN: [id number]   Date of Service  10/23/2023  HPI/Events of Note  Patient with atrial fibrillation with RVR.  eICU Interventions  Amiodarone  150 mg  iv x 1 ordered.        Uday Jantz U Karanveer Ramakrishnan 10/23/2023, 2:58 AM

## 2023-10-24 ENCOUNTER — Inpatient Hospital Stay (HOSPITAL_COMMUNITY)

## 2023-10-24 ENCOUNTER — Other Ambulatory Visit: Payer: Self-pay

## 2023-10-24 DIAGNOSIS — R29721 NIHSS score 21: Secondary | ICD-10-CM | POA: Diagnosis not present

## 2023-10-24 DIAGNOSIS — I480 Paroxysmal atrial fibrillation: Secondary | ICD-10-CM | POA: Diagnosis not present

## 2023-10-24 DIAGNOSIS — I611 Nontraumatic intracerebral hemorrhage in hemisphere, cortical: Secondary | ICD-10-CM | POA: Diagnosis not present

## 2023-10-24 DIAGNOSIS — I609 Nontraumatic subarachnoid hemorrhage, unspecified: Secondary | ICD-10-CM | POA: Diagnosis not present

## 2023-10-24 DIAGNOSIS — J690 Pneumonitis due to inhalation of food and vomit: Secondary | ICD-10-CM | POA: Diagnosis not present

## 2023-10-24 DIAGNOSIS — S065XAA Traumatic subdural hemorrhage with loss of consciousness status unknown, initial encounter: Secondary | ICD-10-CM | POA: Diagnosis not present

## 2023-10-24 DIAGNOSIS — J9601 Acute respiratory failure with hypoxia: Secondary | ICD-10-CM | POA: Diagnosis not present

## 2023-10-24 DIAGNOSIS — R569 Unspecified convulsions: Secondary | ICD-10-CM | POA: Diagnosis not present

## 2023-10-24 DIAGNOSIS — G936 Cerebral edema: Secondary | ICD-10-CM | POA: Diagnosis not present

## 2023-10-24 LAB — CBC
HCT: 41.2 % (ref 39.0–52.0)
Hemoglobin: 13.2 g/dL (ref 13.0–17.0)
MCH: 30.4 pg (ref 26.0–34.0)
MCHC: 32 g/dL (ref 30.0–36.0)
MCV: 94.9 fL (ref 80.0–100.0)
Platelets: 84 10*3/uL — ABNORMAL LOW (ref 150–400)
RBC: 4.34 MIL/uL (ref 4.22–5.81)
RDW: 17.5 % — ABNORMAL HIGH (ref 11.5–15.5)
WBC: 5.7 10*3/uL (ref 4.0–10.5)
nRBC: 0 % (ref 0.0–0.2)

## 2023-10-24 LAB — BASIC METABOLIC PANEL WITH GFR
Anion gap: 9 (ref 5–15)
BUN: 36 mg/dL — ABNORMAL HIGH (ref 8–23)
CO2: 25 mmol/L (ref 22–32)
Calcium: 9.5 mg/dL (ref 8.9–10.3)
Chloride: 111 mmol/L (ref 98–111)
Creatinine, Ser: 1.67 mg/dL — ABNORMAL HIGH (ref 0.61–1.24)
GFR, Estimated: 43 mL/min — ABNORMAL LOW (ref >60)
Glucose, Bld: 216 mg/dL — ABNORMAL HIGH (ref 70–99)
Potassium: 4 mmol/L (ref 3.5–5.1)
Sodium: 145 mmol/L (ref 135–145)

## 2023-10-24 LAB — MAGNESIUM: Magnesium: 1.9 mg/dL (ref 1.7–2.4)

## 2023-10-24 LAB — CULTURE, BLOOD (ROUTINE X 2)
Culture: NO GROWTH
Culture: NO GROWTH
Special Requests: ADEQUATE
Special Requests: ADEQUATE

## 2023-10-24 LAB — CULTURE, BAL-QUANTITATIVE W GRAM STAIN: Culture: 20000 — AB

## 2023-10-24 LAB — GLUCOSE, CAPILLARY
Glucose-Capillary: 229 mg/dL — ABNORMAL HIGH (ref 70–99)
Glucose-Capillary: 219 mg/dL — ABNORMAL HIGH (ref 70–99)
Glucose-Capillary: 246 mg/dL — ABNORMAL HIGH (ref 70–99)
Glucose-Capillary: 280 mg/dL — ABNORMAL HIGH (ref 70–99)
Glucose-Capillary: 222 mg/dL — ABNORMAL HIGH (ref 70–99)
Glucose-Capillary: 210 mg/dL — ABNORMAL HIGH (ref 70–99)

## 2023-10-24 MED ORDER — SODIUM CHLORIDE 0.9% FLUSH: 10.0000 mL | INTRAVENOUS | Status: DC | PRN

## 2023-10-24 MED ORDER — SODIUM CHLORIDE 0.9% FLUSH
10.0000 mL | Freq: Two times a day (BID) | INTRAVENOUS | Status: DC
  Administered 2023-10-24: 20 mL
  Administered 2023-10-25: 10 mL
  Administered 2023-10-26: 20 mL
  Administered 2023-10-26: 10 mL
  Administered 2023-10-27 (×2): 20 mL
  Administered 2023-10-28 – 2023-10-29 (×3): 10 mL

## 2023-10-24 MED ORDER — INSULIN GLARGINE-YFGN 100 UNIT/ML ~~LOC~~ SOLN
15.0000 [IU] | Freq: Every day | SUBCUTANEOUS | Status: DC
  Administered 2023-10-24 – 2023-10-29 (×6): 15 [IU] via SUBCUTANEOUS
  Filled 2023-10-24 (×6): qty 0.15

## 2023-10-24 NOTE — Anesthesia Postprocedure Evaluation (Signed)
 Anesthesia Post Note  Patient: Jonathon [name]  Procedure(s) Performed: AMPUTATION, TOE (Right: Toe)     Patient location during evaluation: PACU Anesthesia Type: MAC Level of consciousness: awake and alert Pain management: pain level controlled Vital Signs Assessment: post-procedure vital signs reviewed and stable Respiratory status: spontaneous breathing, nonlabored ventilation, respiratory function stable and patient connected to nasal cannula oxygen Cardiovascular status: stable and blood pressure returned to baseline Postop Assessment: no apparent nausea or vomiting Anesthetic complications: no   No notable events documented.  Last Vitals:  Vitals:   10/24/23 1300 10/24/23 1353  BP: (!) 137/103   Pulse: (!) 113   Resp: 17   Temp: 37.3 C   SpO2: 98% 100%    Last Pain:  Vitals:   10/24/23 1003  TempSrc:   PainSc: Asleep                 Cordella P Malina Geers

## 2023-10-24 NOTE — Progress Notes (Signed)
 NAME:  Jonathon [name], MRN:  [id number], DOB:  1951-01-30, LOS: 9 ADMISSION DATE:  10/15/2023, CONSULTATION DATE: 10/15/2023 REFERRING MD: EDP, CHIEF COMPLAINT: Intracerebral hemorrhage  History of Present Illness:  73 year old man who was found down by wife poorly responsive required intubation and transportation to the emergency.  CT scan revealed a right intraparenchymal hemorrhage with other areas of injury.  He is currently intubated in atrial fibrillation ventricular response on amiodarone  drip being treated with fentanyl  and propofol  for tube tolerance.  He has been seen by neurosurgery and neurology.  Hyperatremia treatment we await the final results of CT scan of the head to see if there is any surgical options.  Pertinent Medical History:   Past Medical History:  Diagnosis Date   Anemia due to blood loss, acute 2014   2nd GIB   Anxiety    CKD (chronic kidney disease), stage III (HCC) 2014   Diabetes mellitus without complication (HCC)    Gout    High cholesterol    Hypertension 2014   Hypothyroid    Neuropathy    PUD (peptic ulcer disease) 2014    Significant Hospital Events: Including procedures, antibiotic start and stop dates in addition to other pertinent events   6/19 Admitted w/ ICH felt 2/2 HTN and c/b SDH p/ fall. Started AEDs. HTNS. Seen by n surg felt unlikely that surg would improve outcome. Seen by neuro. Placed on clevi. Got AC reversed in ER. Unasyn  started for asp 6/20 weaning CT imaging stable ECHO ordered. 1/2 BC + felt contamination but does bring endocarditis into question. Extubated with increased agitation post-extubation, placed on precedex . Patient does drink alcohol every night, placed on CIWA 6/21 Echo with EF 50-55%, low normal LV function +global hypokinesis, degenerative MV without MR, no evidence of valvular vegetations. 6/23: reintubated after poor responsiveness, gurgling and desaturations 6/24: MRI today for R foot osteo r/o  6/25: MRI  yesterday with osteo - adding linezolid . Will consult podiatry. Bronch completed. Waking up.  6/26: SBT with apnea. ABI today for poss amputation R toe 6/27. Amio  6/28 SBT with apnea  Interim History / Subjective:   No overnight events Unresponsive, not on any continuous sedation-did receive as needed fentanyl  for vent dyssynchrony during cleaning  Objective:   Blood pressure (!) 143/94, pulse (!) 124, temperature (!) 100.8 F (38.2 C), resp. rate 13, height 6' (1.829 m), weight 100.1 kg, SpO2 100%.    Vent Mode: PRVC FiO2 (%):  [30 %-40 %] 30 % Set Rate:  [16 bmp] 16 bmp Vt Set:  [620 mL] 620 mL PEEP:  [5 cmH20] 5 cmH20 Plateau Pressure:  [0 cmH20-17 cmH20] 0 cmH20   Intake/Output Summary (Last 24 hours) at 10/24/2023 0800 Last data filed at 10/24/2023 0510 Gross per 24 hour  Intake 1881.8 ml  Output 1925 ml  Net -43.2 ml   Filed Weights   10/20/23 0500 10/21/23 0500 10/22/23 0403  Weight: 97.2 kg 97.2 kg 100.1 kg   Physical Examination: General: Critically ill-appearing HEENT: Moist oral mucosa, endotracheal tube in place Neuro: Not following commands CV: S1-S2 appreciated PULM: Clear breath sounds HP:dnqu  Extremities: Right foot and postsurgical dressings  I reviewed last 24 h vitals and pain scores, last 48 h intake and output, last 24 h labs and trends, and last 24 h imaging results. Creatinine 1.67  Resolved problem List:   Assessment and Plan:   Right intraparenchymal hemorrhage with brain compression Scattered subarachnoid hemorrhages - Continue antiepileptic drugs -SBP goal less than  160 - Continue supportive therapies - Appreciate stroke team continuing to follow - MRI this weekend yes if not waking up  Acute hypoxic respiratory failure Aspiration pneumonia - Continue full vent support - Failed SBT with apneic episodes  Peripheral vascular disease/osteomyelitis - Amputation of second right toe 6/27 -Remains on linezolid   A-fib with  RVR Hypertension -On amiodarone  gtt. -As needed hydralazine , labetalol  -Scheduled metoprolol   Chronic kidney disease -Follow BMP - Avoid nephrotoxic medications  EtOH use - Continue thiamine , folate, multivitamin  Diabetes - Continue SSI NovoLog  every 4  Is DNR status  Best Practice (right click and Reselect all SmartList Selections daily)   Diet/type: tubefeeds DVT prophylaxis: SCD  Pressure ulcer(s): present on admission  GI prophylaxis: PPI Lines: N/A Foley:  Yes, and it is still needed Code Status:  DNR Last date of multidisciplinary goals of care discussion: [Bedside 6/25 with family]  The patient is critically ill with multiple organ systems failure and requires high complexity decision making for assessment and support, frequent evaluation and titration of therapies, application of advanced monitoring technologies and extensive interpretation of multiple databases. Critical Care Time devoted to patient care services described in this note independent of APP/resident time (if applicable)  is 32 minutes.   Jennet Epley MD [facility] Personal pager: See Amion If unanswered, please page CCM On-call: #[phone]

## 2023-10-24 NOTE — Procedures (Signed)
 Patient Name: Tykee Heideman  MRN: [id number]  Epilepsy Attending: Arlin MALVA Krebs  Referring Physician/Provider: Remi Pippin, NP  Date: 10/24/2023 Duration: 22.43 mins  Patient history: 73yo M with right temporal ICH, noted to have seizure like activity. EEG to evaluate for seizure  Level of alertness: comatose/ lethargic   AEDs during EEG study: PGB  Technical aspects: This EEG study was done with scalp electrodes positioned according to the 10-20 International system of electrode placement. Electrical activity was reviewed with band pass filter of 1-70Hz , sensitivity of 7 uV/mm, display speed of 23mm/sec with a 60Hz  notched filter applied as appropriate. EEG data were recorded continuously and digitally stored.  Video monitoring was available and reviewed as appropriate.  Description: EEG showed continuous generalized and maximal right temporal 3 to 6 Hz theta-delta slowing. Hyperventilation and photic stimulation were not performed.     ABNORMALITY - Continuous slow, generalized and maximal right temporal  IMPRESSION: This study is suggestive of cortical dysfunction arising from right temporal likely secondary to underlying structural abnormality/ ICH. Additionally there was moderate diffuse encephalopathy. No seizures or epileptiform discharges were seen throughout the recording.  Paizley Ramella O Cam Dauphin

## 2023-10-24 NOTE — Progress Notes (Signed)
 Peripherally Inserted Central Catheter Placement  The IV Nurse has discussed with the patient and/or persons authorized to consent for the patient, the purpose of this procedure and the potential benefits and risks involved with this procedure.  The benefits include less needle sticks, lab draws from the catheter, and the patient may be discharged home with the catheter. Risks include, but not limited to, infection, bleeding, blood clot (thrombus formation), and puncture of an artery; nerve damage and irregular heartbeat and possibility to perform a PICC exchange if needed/ordered by physician.  Alternatives to this procedure were also discussed.  Bard Power PICC patient education guide, fact sheet on infection prevention and patient information card has been provided to patient /or left at bedside.   Consent obtained with wife at bedside   PICC Placement Documentation  PICC Double Lumen 10/24/23 Left Basilic 43 cm 0 cm (Active)  Indication for Insertion or Continuance of Line Poor Vasculature-patient has had multiple peripheral attempts or PIVs lasting less than 24 hours 10/24/23 1600  Exposed Catheter (cm) 0 cm 10/24/23 1600  Site Assessment Clean, Dry, Intact 10/24/23 1600  Lumen #1 Status Flushed;Saline locked;Blood return noted 10/24/23 1600  Lumen #2 Status Flushed;Saline locked;Blood return noted 10/24/23 1600  Dressing Type Transparent;Securing device 10/24/23 1600  Dressing Status Antimicrobial disc/dressing in place;Clean, Dry, Intact 10/24/23 1600  Line Care Connections checked and tightened 10/24/23 1600  Line Adjustment (NICU/IV Team Only) No 10/24/23 1600  Dressing Intervention New dressing;Adhesive placed at insertion site (IV team only) 10/24/23 1600  Dressing Change Due 10/31/23 10/24/23 1600       Ethyl Priestly Dwight 10/24/2023, 4:03 PM

## 2023-10-24 NOTE — Progress Notes (Signed)
 STROKE TEAM PROGRESS NOTE    SIGNIFICANT HOSPITAL EVENTS  6/19: level 2 trauma after being found on the ground incontinent and shaking.  HR 200s with wide complex QRS, amio bolus given and drip started.  GCS 6. Intubated on arrival.  CT Head shows an acute intraparenchymal hematoma within the right lateral temporal lobe with surrounding vasogenic edema and 5 mm midline shift and scattered subarachnoid hemorrhages.  PTA xarelto  reversed with [facility] on Admission 22. 6/20: Repeat CT head early this a.m. stable. Extubated in afternoon. 6/21: MRI stable IPH, multiple acute/subacute cortical infarcts seen. 3% stopped.  6/23- re intubated 6/25-found to have osteomyelitis of right second toe, amputation planned for Friday, bronchoscopy performed and cortrak inserted  INTERIM HISTORY/SUBJECTIVE Patient is seen in his room with his wife at the bedside.  Patient remains intubated.  He is on no sedation but not awakening or following commands.  He had right toe surgery yesterday.  Last brain imaging on 10/22/2023 shows expected evolutionary changes in the right temporal hematoma with mass effect of 6 mm midline shift no new or worsening abnormality.  OBJECTIVE  CBC    Component Value Date/Time   WBC 5.7 10/24/2023 0600   RBC 4.34 10/24/2023 0600   HGB 13.2 10/24/2023 0600   HGB 17.4 08/12/2023 1622   HCT 41.2 10/24/2023 0600   HCT 53.7 (H) 08/12/2023 1622   PLT 84 (L) 10/24/2023 0600   PLT 127 (L) 08/12/2023 1622   MCV 94.9 10/24/2023 0600   MCV 93 08/12/2023 1622   MCH 30.4 10/24/2023 0600   MCHC 32.0 10/24/2023 0600   RDW 17.5 (H) 10/24/2023 0600   RDW 13.1 08/12/2023 1622   LYMPHSABS 0.9 08/31/2023 1632   LYMPHSABS 0.8 08/04/2022 1459   MONOABS 0.3 08/31/2023 1632   EOSABS 0.0 08/31/2023 1632   EOSABS 0.1 08/04/2022 1459   BASOSABS 0.0 08/31/2023 1632   BASOSABS 0.0 08/04/2022 1459    BMET    Component Value Date/Time   NA 145 10/24/2023 0600   NA 138 08/31/2023 1632    K 4.0 10/24/2023 0600   CL 111 10/24/2023 0600   CO2 25 10/24/2023 0600   GLUCOSE 216 (H) 10/24/2023 0600   BUN 36 (H) 10/24/2023 0600   BUN 15 08/31/2023 1632   CREATININE 1.67 (H) 10/24/2023 0600   CALCIUM  9.5 10/24/2023 0600   EGFR 51 (L) 08/31/2023 1632   GFRNONAA 43 (L) 10/24/2023 0600    IMAGING past 24 hours US  EKG SITE RITE Result Date: 10/24/2023 If Site Rite image not attached, placement could not be confirmed due to current cardiac rhythm.  US  EKG SITE RITE Result Date: 10/24/2023 If Site Rite image not attached, placement could not be confirmed due to current cardiac rhythm.  DG Foot 2 Views Right Result Date: 10/23/2023 CLINICAL DATA:  [id number] Post-operative state 252351 EXAM: RIGHT FOOT - 2 VIEW COMPARISON:  October 15, 2023 FINDINGS: Redemonstrated amputation of the great toe. Interval amputation of the second digit.No acute fracture or dislocation. There is no evidence of arthropathy or other focal bone abnormality. Peripheral vascular atherosclerosis. No subcutaneous gas. IMPRESSION: Expected postsurgical changes related to recent amputation of the second digit. No radiopaque foreign body or. No subcutaneous gas or bony changes to suggest osteomyelitis. Electronically Signed   By: Rogelia Myers M.D.   On: 10/23/2023 14:17    Vitals:   10/24/23 1100 10/24/23 1200 10/24/23 1300 10/24/23 1353  BP: 130/78 (!) 154/112 (!) 137/103   Pulse: (!) 103 92 (!)  113   Resp: 16 16 17    Temp: 99.3 F (37.4 C) 99.1 F (37.3 C) 99.1 F (37.3 C)   TempSrc:      SpO2: 97% 97% 98% 100%  Weight:      Height:        PHYSICAL EXAM General: Elderly male, acutely ill CV: paroxysmal Afib on monitor Respiratory: Intubated, mechanically ventilated on full support   NEURO: (On no sedation) patient is lying with eyes closed.  His unresponsive.  Does not follow commands.  Pupils equal round and reactive, opens eyes to voice but does not track examiner, blinks to threat bilaterally, does  not follow commands or respond to noxious stimuli  Temp:  [98.6 F (37 C)-100.8 F (38.2 C)] 99.1 F (37.3 C) (06/28 1300) Pulse Rate:  [92-130] 113 (06/28 1300) Resp:  [0-17] 17 (06/28 1300) BP: (130-163)/(74-117) 137/103 (06/28 1300) SpO2:  [96 %-100 %] 100 % (06/28 1353) FiO2 (%):  [30 %-40 %] 30 % (06/28 1353)   Most Recent NIH: 21     ASSESSMENT/PLAN  Jonathon [name] is a 73 y.o. male with  73 y.o. male with hx of anemia, afib on xarleto, anxiety, CKD stage 3, DM without complication, HLD, HTN, Hypothyroidism, PUD presenting as a level 2 trauma after being found on the ground incontinent and shaking. He was given 5mg  of versed  by EMS due to combativeness. He is also on doxycycline  for a toe infection. HR 200s with wide complex QRS, amio bolus given and drip started. GCS 6. Intubated on arrival. CT Head shows an acute intraparenchymal hematoma within the right lateral temporal lobe with surrounding vasogenic edema and 5 mm midline shift and scattered subarachnoid hemorrhages. PTA xarelto  reversed with [facility] on Admission 22.  ICH:  right temporoparietal ICH with scattered SAH, etiology: hypertensive in the setting of DOAC CT Head Acute IPH within right lateral temporal lobe with surrounding vasogenic edema and mass effect, midline shift of approximately 5 mm to left. Scattered subarachnoid hemorrhage CTA head & neck Negative for spot sign, intracranial aneurysm, or evidence of vascular malformation. Right sided subdural hematoma (estimated at 4 mm) suspected in addition to intra-axial and subarachnoid blood. Repeat CT head 6/20 early AM: overall stable Right temporoparietal parenchymal hemorrhage with surrounding vasogenic edema and mass effect, not significantly changed.  Midline shift is now 6 mm. Scattered subarachnoid hemorrhage, similar to prior. Status post [facility]  reversal MRI  with and without contrast: Stable right temporoparietal IPH and associated vasogenic edema.  Mass effect and midline shift similar to prior study, measuring 6 mm. Multiple punctate acute/subacute bilateral cortical infarcts 6/23- CT Head- Stable hematoma, with MLS 7mm CT repeat 6/26 right temporal IPH similar in size with vasogenic edema and 6 mm midline shift, subarachnoid hemorrhage less prominent Consider MRI repeat over the weekend if no significant neuro improvement 2D Echo: EF 50 to 55%, global hypokinesis, degenerative mitral valve LDL 54 in 08/2023 HgbA1c 5.6 in 08/2023 VTE prophylaxis - SCDs Xarelto  (rivaroxaban ) daily prior to admission, continue No antithrombotic due to ICH Therapy recommendations:  CIR Disposition: Pending  Cerebral Edema Repeat CT 6/23 overall stable hematoma, and slight increase cerebral edema with MLS 7mm CT repeat 6/26 demonstrates stable IPH NS consulted, no surgical intervention at this time. 3% bolus of 250 given 6/19 3% hypertonic saline stopped 6/21 -> on NS @ 50-> off Na 138-134-136-144-150-148-154-151 -149- [phone] Allow Na gradually trending down  Seizure like activity Incontinence with shaking at onset per report No seizure since  admission LTM EEG no seizure-like activity seen, discontinued 6/20 S/p keppra  load Keppra  7 days course completed  Acute Respiratory Failure ?Aspiration Pneumonia Intubated 6/19 d/t mental status, inability to protect airway Extubated 6/20 afternoon -> 6L [place] -> 15L NRB->re-intubated 6/23 B Cx showed 1/4 steph hemolyticus, concerning for contamination, repeat blood cultures NGTD WBC 4.6->5.7->5.8->5.4 CXR with New right perihilar airspace disease S/p bronch 6/25, no mucous plugs On unasyn    Atrial fibrillation with RVR Home Meds: Xarelto  20 mg daily Continue telemetry monitoring 6/26 overnight RVR 150s received amio bolus, now off amio IV No anticoagulation now due to ICH  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 N/A TOC consult for cessation placed  Dysphagia Patient has post-stroke dysphagia, SLP consulted Now n.p.o. On TF @ 55 -> off 6/27 for OR  Other Stroke Risk Factors Obesity, Body mass index is 29.93 kg/m., BMI >/= 30 associated with increased stroke risk, recommend weight loss, diet and exercise as appropriate Former smoker  Other Active Problems CKD3b, creatinine 1.50--1.35--1.58--1.50--1.38--1.36--1.58-1.57, on TF  Hospital day # 9 Patient seen and examined by NP/APP with MD. MD to update note as needed.   Patient is several days out from his hemorrhage and repeat imaging shows expected lower extremity changes but patient neurological exam and mental status seem out of proportion there is no obvious explanation.  Plan to check EEG today.  Long discussion at bedside with patient's wife.  Patient does not show significant meaningful improvement over the weekend may need to have further discussion about goals of care.  Wife is quite realistic.  Continue ventilator support for respiratory failure as per critical care team.  Discussed with Dr.Olalere critical care medicine. This patient is critically ill and at significant risk of neurological worsening, death and care requires constant monitoring of vital signs, hemodynamics,respiratory and cardiac monitoring, extensive review of multiple databases, frequent neurological assessment, discussion with family, other specialists and medical decision making of high  complexity.I have made any additions or clarifications directly to the above note.This critical care time does not reflect procedure time, or teaching time or supervisory time of PA/NP/Med Resident etc but could involve care discussion time.  I spent 30 minutes of neurocritical care time  in the care of  this patient.    Eather Popp, MD

## 2023-10-24 NOTE — Progress Notes (Signed)
 EEG complete - results pending

## 2023-10-24 NOTE — Progress Notes (Signed)
 PT Cancellation Note  Patient Details Name: Jonathon [name] MRN: [id number] DOB: April 21, 1951   Cancelled Treatment:    Reason Eval/Treat Not Completed: Fatigue/lethargy limiting ability to participate. Per discussion with RN and chart review the pt remains intubated, is off sedation but does not arouse with attempts at stimulation. PT will attempt to follow up on Monday. If the pt becomes more alert prior please reach out to PT services via secure chat for earlier initiation of re-evaluation.   Bernardino JINNY [name] 10/24/2023, 2:06 PM

## 2023-10-24 NOTE — Transfer of Care (Signed)
 Immediate Anesthesia Transfer of Care Note  Patient: Jonathon [name]  Procedure(s) Performed: AMPUTATION, TOE (Right: Toe)  Patient Location: PACU  Anesthesia Type:MAC  Level of Consciousness: awake  Airway & Oxygen Therapy: Patient Spontanous Breathing  Post-op Assessment: Report given to RN  Post vital signs: Reviewed  Last Vitals:  Vitals Value Taken Time  BP 155/118 10/24/23 14:00  Temp 37.4 C 10/24/23 14:31  Pulse 115 10/24/23 14:31  Resp 16 10/24/23 14:31  SpO2 98 % 10/24/23 14:31  Vitals shown include unfiled device data.  Last Pain:  Vitals:   10/24/23 1003  TempSrc:   PainSc: Asleep         Complications: No notable events documented.

## 2023-10-25 DIAGNOSIS — J9601 Acute respiratory failure with hypoxia: Secondary | ICD-10-CM | POA: Diagnosis not present

## 2023-10-25 DIAGNOSIS — G936 Cerebral edema: Secondary | ICD-10-CM | POA: Diagnosis not present

## 2023-10-25 DIAGNOSIS — I611 Nontraumatic intracerebral hemorrhage in hemisphere, cortical: Secondary | ICD-10-CM | POA: Diagnosis not present

## 2023-10-25 DIAGNOSIS — I609 Nontraumatic subarachnoid hemorrhage, unspecified: Secondary | ICD-10-CM | POA: Diagnosis not present

## 2023-10-25 DIAGNOSIS — I480 Paroxysmal atrial fibrillation: Secondary | ICD-10-CM | POA: Diagnosis not present

## 2023-10-25 DIAGNOSIS — S065XAA Traumatic subdural hemorrhage with loss of consciousness status unknown, initial encounter: Secondary | ICD-10-CM | POA: Diagnosis not present

## 2023-10-25 DIAGNOSIS — R29721 NIHSS score 21: Secondary | ICD-10-CM | POA: Diagnosis not present

## 2023-10-25 DIAGNOSIS — J690 Pneumonitis due to inhalation of food and vomit: Secondary | ICD-10-CM | POA: Diagnosis not present

## 2023-10-25 LAB — CBC
HCT: 40.9 % (ref 39.0–52.0)
Hemoglobin: 13 g/dL (ref 13.0–17.0)
MCH: 30.2 pg (ref 26.0–34.0)
MCHC: 31.8 g/dL (ref 30.0–36.0)
MCV: 95.1 fL (ref 80.0–100.0)
Platelets: 82 10*3/uL — ABNORMAL LOW (ref 150–400)
RBC: 4.3 MIL/uL (ref 4.22–5.81)
RDW: 17.4 % — ABNORMAL HIGH (ref 11.5–15.5)
WBC: 5.5 10*3/uL (ref 4.0–10.5)
nRBC: 0 % (ref 0.0–0.2)

## 2023-10-25 LAB — BASIC METABOLIC PANEL WITH GFR
Anion gap: 8 (ref 5–15)
BUN: 34 mg/dL — ABNORMAL HIGH (ref 8–23)
CO2: 26 mmol/L (ref 22–32)
Calcium: 9.5 mg/dL (ref 8.9–10.3)
Chloride: 110 mmol/L (ref 98–111)
Creatinine, Ser: 1.53 mg/dL — ABNORMAL HIGH (ref 0.61–1.24)
GFR, Estimated: 48 mL/min — ABNORMAL LOW (ref >60)
Glucose, Bld: 232 mg/dL — ABNORMAL HIGH (ref 70–99)
Potassium: 3.6 mmol/L (ref 3.5–5.1)
Sodium: 144 mmol/L (ref 135–145)

## 2023-10-25 LAB — GLUCOSE, CAPILLARY
Glucose-Capillary: 235 mg/dL — ABNORMAL HIGH (ref 70–99)
Glucose-Capillary: 221 mg/dL — ABNORMAL HIGH (ref 70–99)
Glucose-Capillary: 213 mg/dL — ABNORMAL HIGH (ref 70–99)
Glucose-Capillary: 221 mg/dL — ABNORMAL HIGH (ref 70–99)
Glucose-Capillary: 221 mg/dL — ABNORMAL HIGH (ref 70–99)
Glucose-Capillary: 198 mg/dL — ABNORMAL HIGH (ref 70–99)

## 2023-10-25 LAB — MAGNESIUM: Magnesium: 1.9 mg/dL (ref 1.7–2.4)

## 2023-10-25 MED ORDER — INSULIN ASPART 100 UNIT/ML IJ SOLN
9.0000 [IU] | INTRAMUSCULAR | Status: DC
  Administered 2023-10-25 – 2023-10-29 (×23): 9 [IU] via SUBCUTANEOUS

## 2023-10-25 MED ORDER — METOPROLOL TARTRATE 25 MG/10 ML ORAL SUSPENSION
25.0000 mg | Freq: Two times a day (BID) | ORAL | Status: DC
  Administered 2023-10-25 – 2023-10-27 (×4): 25 mg
  Filled 2023-10-25 (×5): qty 10

## 2023-10-25 MED ORDER — MODAFINIL 100 MG PO TABS
100.0000 mg | ORAL_TABLET | Freq: Every day | ORAL | Status: DC
  Administered 2023-10-25: 100 mg
  Filled 2023-10-25 (×2): qty 1

## 2023-10-25 MED ORDER — MODAFINIL 100 MG PO TABS: 100.0000 mg | ORAL_TABLET | Freq: Every day | ORAL | Status: DC

## 2023-10-25 NOTE — Progress Notes (Signed)
 STROKE TEAM PROGRESS NOTE    SIGNIFICANT HOSPITAL EVENTS  6/19: level 2 trauma after being found on the ground incontinent and shaking.  HR 200s with wide complex QRS, amio bolus given and drip started.  GCS 6. Intubated on arrival.  CT Head shows an acute intraparenchymal hematoma within the right lateral temporal lobe with surrounding vasogenic edema and 5 mm midline shift and scattered subarachnoid hemorrhages.  PTA xarelto  reversed with [facility] on Admission 22. 6/20: Repeat CT head early this a.m. stable. Extubated in afternoon. 6/21: MRI stable IPH, multiple acute/subacute cortical infarcts seen. 3% stopped.  6/23- re intubated 6/25-found to have osteomyelitis of right second toe, amputation planned for Friday, bronchoscopy performed and cortrak inserted  INTERIM HISTORY/SUBJECTIVE Patient is seen in his room with his wife and daughter at the bedside.  Patient remains intubated.  Remains unresponsive and does not open his eyes.  He is on no sedation but not awakening or following commands.  He is minimally responsive to sternal rub with partial localization in the right arm and right leg.  He tells have trace movement in the left leg with no movement of the left arm. EEG done yesterday showed focal right temporal slowing without definite seizure activity. OBJECTIVE  CBC    Component Value Date/Time   WBC 5.5 10/25/2023 0500   RBC 4.30 10/25/2023 0500   HGB 13.0 10/25/2023 0500   HGB 17.4 08/12/2023 1622   HCT 40.9 10/25/2023 0500   HCT 53.7 (H) 08/12/2023 1622   PLT 82 (L) 10/25/2023 0500   PLT 127 (L) 08/12/2023 1622   MCV 95.1 10/25/2023 0500   MCV 93 08/12/2023 1622   MCH 30.2 10/25/2023 0500   MCHC 31.8 10/25/2023 0500   RDW 17.4 (H) 10/25/2023 0500   RDW 13.1 08/12/2023 1622   LYMPHSABS 0.9 08/31/2023 1632   LYMPHSABS 0.8 08/04/2022 1459   MONOABS 0.3 08/31/2023 1632   EOSABS 0.0 08/31/2023 1632   EOSABS 0.1 08/04/2022 1459   BASOSABS 0.0 08/31/2023 1632    BASOSABS 0.0 08/04/2022 1459    BMET    Component Value Date/Time   NA 144 10/25/2023 0500   NA 138 08/31/2023 1632   K 3.6 10/25/2023 0500   CL 110 10/25/2023 0500   CO2 26 10/25/2023 0500   GLUCOSE 232 (H) 10/25/2023 0500   BUN 34 (H) 10/25/2023 0500   BUN 15 08/31/2023 1632   CREATININE 1.53 (H) 10/25/2023 0500   CALCIUM  9.5 10/25/2023 0500   EGFR 51 (L) 08/31/2023 1632   GFRNONAA 48 (L) 10/25/2023 0500    IMAGING past 24 hours DG Chest Port 1 View Result Date: 10/24/2023 CLINICAL DATA:  Status post central line placement EXAM: PORTABLE CHEST 1 VIEW COMPARISON:  10/21/2023 FINDINGS: Stable cardiomediastinal silhouette. Aortic atherosclerotic calcification. Similar retrocardiac atelectasis. Otherwise no focal consolidation, pleural effusion, or pneumothorax. No displaced rib fractures. Stable position of the endotracheal tube with tip 5.5 cm from the carina. Subdiaphragmatic enteric tube. Left PICC tip in the mid SVC. IMPRESSION: Left PICC tip in the mid SVC.  No pneumothorax. Electronically Signed   By: Norman Gatlin M.D.   On: 10/24/2023 17:13   EEG adult Result Date: 10/24/2023 Jonathon Jonathon KIDD, MD     10/24/2023  4:45 PM [name] MRN: [id number] Epilepsy Attending: Arlin [name] Jonathon Referring Physician/Provider: Remi Pippin, NP Date: 10/24/2023 Duration: 22.43 mins Patient history: 73yo M with right temporal ICH, noted to have seizure like activity. EEG to evaluate for seizure  Level of alertness: comatose/ lethargic AEDs during EEG study: PGB Technical aspects: This EEG study was done with scalp electrodes positioned according to the 10-20 International system of electrode placement. Electrical activity was reviewed with band pass filter of 1-70Hz , sensitivity of 7 uV/mm, display speed of 79mm/sec with a 60Hz  notched filter applied as appropriate. EEG data were recorded continuously and digitally stored.  Video monitoring was available and reviewed as  appropriate. Description: EEG showed continuous generalized and maximal right temporal 3 to 6 Hz theta-delta slowing. Hyperventilation and photic stimulation were not performed.   ABNORMALITY - Continuous slow, generalized and maximal right temporal IMPRESSION: This study is suggestive of cortical dysfunction arising from right temporal likely secondary to underlying structural abnormality/ ICH. Additionally there was moderate diffuse encephalopathy. No seizures or epileptiform discharges were seen throughout the recording. Jonathon [name]   US  EKG SITE RITE Result Date: 10/24/2023 If [facility] image not attached, placement could not be confirmed due to current cardiac rhythm.  US  EKG SITE RITE Result Date: 10/24/2023 If Site Rite image not attached, placement could not be confirmed due to current cardiac rhythm.   Vitals:   10/25/23 0600 10/25/23 0800 10/25/23 0850 10/25/23 0900  BP: (!) 152/96 (!) 149/89  (!) 153/101  Pulse: (!) 108 85  (!) 107  Resp: 14 19  16   Temp: 99.5 F (37.5 C) 99.5 F (37.5 C)  99.7 F (37.6 C)  TempSrc:  Bladder    SpO2: 100% 95% 98% 99%  Weight:      Height:        PHYSICAL EXAM General: Elderly male, acutely ill CV: paroxysmal Afib on monitor Respiratory: Intubated, mechanically ventilated on full support   NEURO: (On no sedation) patient is lying with eyes closed.  He is unresponsive.  Does not follow commands.  Pupils equal round and reactive, opens eyes to voice but does not track examiner, blinks to threat bilaterally, does not follow commands or respond to noxious stimuli  Temp:  [99.1 F (37.3 C)-99.7 F (37.6 C)] 99.7 F (37.6 C) (06/29 0900) Pulse Rate:  [85-129] 107 (06/29 0900) Resp:  [14-19] 16 (06/29 0900) BP: (129-164)/(83-118) 153/101 (06/29 0900) SpO2:  [95 %-100 %] 99 % (06/29 0900) FiO2 (%):  [30 %] 30 % (06/29 0850) Weight:  [100.7 kg] 100.7 kg (06/29 0500)   Most Recent NIH: 21     ASSESSMENT/PLAN  [name] is a 73 y.o. male with  73 y.o. male with hx of anemia, afib on xarleto, anxiety, CKD stage 3, DM without complication, HLD, HTN, Hypothyroidism, PUD presenting as a level 2 trauma after being found on the ground incontinent and shaking. He was given 5mg  of versed  by EMS due to combativeness. He is also on doxycycline  for a toe infection. HR 200s with wide complex QRS, amio bolus given and drip started. GCS 6. Intubated on arrival. CT Head shows an acute intraparenchymal hematoma within the right lateral temporal lobe with surrounding vasogenic edema and 5 mm midline shift and scattered subarachnoid hemorrhages. PTA xarelto  reversed with [facility] on Admission 22.  ICH:  right temporoparietal ICH with scattered SAH, etiology: hypertensive in the setting of DOAC CT Head Acute IPH within right lateral temporal lobe with surrounding vasogenic edema and mass effect, midline shift of approximately 5 mm to left. Scattered subarachnoid hemorrhage CTA head & neck Negative for spot sign, intracranial aneurysm, or evidence of vascular malformation. Right sided subdural hematoma (estimated at 4 mm) suspected in  addition to intra-axial and subarachnoid blood. Repeat CT head 6/20 early AM: overall stable Right temporoparietal parenchymal hemorrhage with surrounding vasogenic edema and mass effect, not significantly changed.  Midline shift is now 6 mm. Scattered subarachnoid hemorrhage, similar to prior. Status post [facility]  reversal MRI  with and without contrast: Stable right temporoparietal IPH and associated vasogenic edema. Mass effect and midline shift similar to prior study, measuring 6 mm. Multiple punctate acute/subacute bilateral cortical infarcts 6/23- CT Head- Stable hematoma, with MLS 7mm CT repeat 6/26 right temporal IPH similar in size with vasogenic edema and 6 mm midline shift, subarachnoid hemorrhage less prominent Consider MRI repeat over the weekend if no significant neuro improvement 2D  Echo: EF 50 to 55%, global hypokinesis, degenerative mitral valve LDL 54 in 08/2023 HgbA1c 5.6 in 08/2023 VTE prophylaxis - SCDs Xarelto  (rivaroxaban ) daily prior to admission, continue No antithrombotic due to ICH Therapy recommendations:  CIR Disposition: Pending  Cerebral Edema Repeat CT 6/23 overall stable hematoma, and slight increase cerebral edema with MLS 7mm CT repeat 6/26 demonstrates stable IPH NS consulted, no surgical intervention at this time. 3% bolus of 250 given 6/19 3% hypertonic saline stopped 6/21 -> on NS @ 50-> off Na 138-134-136-144-150-148-154-151 -149- [phone] Allow Na gradually trending down  Seizure like activity Incontinence with shaking at onset per report No seizure since admission LTM EEG no seizure-like activity seen, discontinued 6/20 S/p keppra  load Keppra  7 days course completed  Acute Respiratory Failure ?Aspiration Pneumonia Intubated 6/19 d/t mental status, inability to protect airway Extubated 6/20 afternoon -> 6L [place] -> 15L NRB->re-intubated 6/23 B Cx showed 1/4 steph hemolyticus, concerning for contamination, repeat blood cultures NGTD WBC 4.6->5.7->5.8->5.4 CXR with New right perihilar airspace disease S/p bronch 6/25, no mucous plugs On unasyn    Atrial fibrillation with RVR Home Meds: Xarelto  20 mg daily Continue telemetry monitoring 6/26 overnight RVR 150s received amio bolus, now off amio IV No anticoagulation now due to ICH  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 N/A TOC consult for cessation placed  Dysphagia Patient has post-stroke dysphagia, SLP consulted Now n.p.o. On TF @ 55 -> off 6/27 for OR  Other Stroke Risk Factors Obesity, Body mass index is 30.11 kg/m., BMI >/= 30 associated with increased stroke risk, recommend weight loss, diet and exercise as appropriate Former smoker  Other Active Problems CKD3b, creatinine 1.50--1.35--1.58--1.50--1.38--1.36--1.58-1.57, on TF  Hospital day # 10 Patient seen and examined by NP/APP with MD. MD to update note as needed.   Patient is several days out from his hemorrhage and repeat imaging shows expected evolutionary changes but patient neurological exam and mental status seem out of proportion without obvious explanation.  He is also afebrile with no signs of infection.  EEG also shows no seizure activity.  Long discussion at bedside with patient's wife and daughter at the bedside.  In case patient does not show significant meaningful improvement over the weekend may need to have further discussion  about goals of care.  Wife is quite realistic and feels patient would not want tracheostomy, PEG tube and prolonged nursing home care which may be unavoidable..  Plan trial of Provigil to promote wakefulness.  Continue ventilator support for respiratory failure as per critical care team.  Discussed with Dr.Olalere critical care medicine.  . This patient is critically ill and at significant risk of neurological worsening, death and care requires constant monitoring of vital signs, hemodynamics,respiratory and cardiac monitoring, extensive review of multiple databases, frequent neurological assessment, discussion with family, other specialists and medical decision making of high complexity.I have made any additions or clarifications directly to the above note.This critical care time does not reflect procedure  time, or teaching time or supervisory time of PA/NP/Med Resident etc but could involve care discussion time.  I spent 30 minutes of neurocritical care time  in the care of  this patient.       Eather Popp, MD

## 2023-10-25 NOTE — Progress Notes (Addendum)
 NAME:  Jonathon [name], MRN:  [id number], DOB:  Sep 12, 1950, LOS: 10 ADMISSION DATE:  10/15/2023, CONSULTATION DATE: 10/15/2023 REFERRING MD: EDP, CHIEF COMPLAINT: Intracerebral hemorrhage  History of Present Illness:  73 year old man who was found down by wife poorly responsive required intubation and transportation to the emergency.  CT scan revealed a right intraparenchymal hemorrhage with other areas of injury.  He is currently intubated in atrial fibrillation ventricular response on amiodarone  drip being treated with fentanyl  and propofol  for tube tolerance.  He has been seen by neurosurgery and neurology.  Hyperatremia treatment we await the final results of CT scan of the head to see if there is any surgical options.  Pertinent Medical History:   Past Medical History:  Diagnosis Date   Anemia due to blood loss, acute 2014   2nd GIB   Anxiety    CKD (chronic kidney disease), stage III (HCC) 2014   Diabetes mellitus without complication (HCC)    Gout    High cholesterol    Hypertension 2014   Hypothyroid    Neuropathy    PUD (peptic ulcer disease) 2014    Significant Hospital Events: Including procedures, antibiotic start and stop dates in addition to other pertinent events   6/19 Admitted w/ ICH felt 2/2 HTN and c/b SDH p/ fall. Started AEDs. HTNS. Seen by n surg felt unlikely that surg would improve outcome. Seen by neuro. Placed on clevi. Got AC reversed in ER. Unasyn  started for asp 6/20 weaning CT imaging stable ECHO ordered. 1/2 BC + felt contamination but does bring endocarditis into question. Extubated with increased agitation post-extubation, placed on precedex . Patient does drink alcohol every night, placed on CIWA 6/21 Echo with EF 50-55%, low normal LV function +global hypokinesis, degenerative MV without MR, no evidence of valvular vegetations. 6/23: reintubated after poor responsiveness, gurgling and desaturations 6/24: MRI today for R foot osteo r/o  6/25: MRI  yesterday with osteo - adding linezolid . Will consult podiatry. Bronch completed. Waking up.  6/26: SBT with apnea. ABI today for poss amputation R toe 6/27. Amio  6/28 SBT with apnea  Interim History / Subjective:   No overnight events Appears to be slightly more responsive although not able to follow commands, does respond to painful stimulation, some flickering of his eyelids with loud noise/name-calling  Objective:   Blood pressure (!) 149/89, pulse 85, temperature 99.5 F (37.5 C), temperature source Core, resp. rate 19, height 6' (1.829 m), weight 100.7 kg, SpO2 95%.    Vent Mode: PRVC FiO2 (%):  [30 %] 30 % Set Rate:  [16 bmp] 16 bmp Vt Set:  [620 mL] 620 mL PEEP:  [5 cmH20] 5 cmH20 Plateau Pressure:  [14 cmH20-16 cmH20] 14 cmH20   Intake/Output Summary (Last 24 hours) at 10/25/2023 0840 Last data filed at 10/25/2023 0600 Gross per 24 hour  Intake 2518.3 ml  Output 1725 ml  Net 793.3 ml   Filed Weights   10/21/23 0500 10/22/23 0403 10/25/23 0500  Weight: 97.2 kg 100.1 kg 100.7 kg   Physical Examination: General: Critically ill-appearing HEENT: Moist oral mucosa, endotracheal tube in place Neuro: Not following commands CV: S1-S2 appreciated PULM: Clear breath sounds soft HP:dnqu  Extremities: Right foot and postsurgical dressings  I reviewed last 24 h vitals and pain scores, last 48 h intake and output, last 24 h labs and trends, and last 24 h imaging results.  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 6/27 - Can discontinue linezolid  after dose today  A-fib with RVR Hypertension - On amiodarone  - As needed hydralazine , labetalol  - Scheduled metoprolol  -increased dose to 50  Chronic kidney disease - Follow BMP - Avoid nephrotoxic medications  EtOH use history - Continue thiamine , folate  Diabetes - Continue SSI  Is DNR status  Trial with modafinil 100  Best Practice (right click and Reselect all SmartList Selections daily)   Diet/type: tubefeeds DVT prophylaxis: SCD  Pressure ulcer(s): present on admission  GI prophylaxis: PPI Lines: N/A Foley:  Yes, and it is still needed Code Status:  DNR Last date of multidisciplinary goals of care discussion: [Bedside 6/28 with spouse at bedside ]  The patient is critically ill with multiple organ systems failure and requires high complexity decision making for assessment and support, frequent evaluation and titration of therapies, application of advanced monitoring technologies and extensive interpretation of multiple databases. Critical Care Time devoted to patient care services described in this note independent of APP/resident time (if applicable)  is 32 minutes.   Jennet Epley MD [facility] Personal pager: See Amion If unanswered, please page CCM On-call: #[phone]

## 2023-10-26 ENCOUNTER — Encounter (HOSPITAL_COMMUNITY): Payer: Self-pay | Admitting: Podiatry

## 2023-10-26 DIAGNOSIS — R29721 NIHSS score 21: Secondary | ICD-10-CM | POA: Diagnosis not present

## 2023-10-26 DIAGNOSIS — I609 Nontraumatic subarachnoid hemorrhage, unspecified: Secondary | ICD-10-CM | POA: Diagnosis not present

## 2023-10-26 DIAGNOSIS — I129 Hypertensive chronic kidney disease with stage 1 through stage 4 chronic kidney disease, or unspecified chronic kidney disease: Secondary | ICD-10-CM | POA: Diagnosis not present

## 2023-10-26 DIAGNOSIS — I611 Nontraumatic intracerebral hemorrhage in hemisphere, cortical: Secondary | ICD-10-CM | POA: Diagnosis not present

## 2023-10-26 LAB — BASIC METABOLIC PANEL WITH GFR
Anion gap: 10 (ref 5–15)
BUN: 38 mg/dL — ABNORMAL HIGH (ref 8–23)
CO2: 24 mmol/L (ref 22–32)
Calcium: 9.4 mg/dL (ref 8.9–10.3)
Chloride: 109 mmol/L (ref 98–111)
Creatinine, Ser: 1.56 mg/dL — ABNORMAL HIGH (ref 0.61–1.24)
GFR, Estimated: 47 mL/min — ABNORMAL LOW (ref >60)
Glucose, Bld: 197 mg/dL — ABNORMAL HIGH (ref 70–99)
Potassium: 3.5 mmol/L (ref 3.5–5.1)
Sodium: 143 mmol/L (ref 135–145)

## 2023-10-26 LAB — GLUCOSE, CAPILLARY
Glucose-Capillary: 128 mg/dL — ABNORMAL HIGH (ref 70–99)
Glucose-Capillary: 141 mg/dL — ABNORMAL HIGH (ref 70–99)
Glucose-Capillary: 151 mg/dL — ABNORMAL HIGH (ref 70–99)
Glucose-Capillary: 160 mg/dL — ABNORMAL HIGH (ref 70–99)
Glucose-Capillary: 177 mg/dL — ABNORMAL HIGH (ref 70–99)

## 2023-10-26 LAB — CBC
HCT: 38.2 % — ABNORMAL LOW (ref 39.0–52.0)
Hemoglobin: 12.2 g/dL — ABNORMAL LOW (ref 13.0–17.0)
MCH: 30.5 pg (ref 26.0–34.0)
MCHC: 31.9 g/dL (ref 30.0–36.0)
MCV: 95.5 fL (ref 80.0–100.0)
Platelets: 81 10*3/uL — ABNORMAL LOW (ref 150–400)
RBC: 4 MIL/uL — ABNORMAL LOW (ref 4.22–5.81)
RDW: 17.3 % — ABNORMAL HIGH (ref 11.5–15.5)
WBC: 4.8 10*3/uL (ref 4.0–10.5)
nRBC: 0 % (ref 0.0–0.2)

## 2023-10-26 LAB — MAGNESIUM: Magnesium: 2 mg/dL (ref 1.7–2.4)

## 2023-10-26 MED ORDER — BANATROL TF EN LIQD
60.0000 mL | Freq: Two times a day (BID) | ENTERAL | Status: DC
  Administered 2023-10-26 – 2023-10-29 (×7): 60 mL
  Filled 2023-10-26 (×7): qty 60

## 2023-10-26 MED ORDER — SENNOSIDES-DOCUSATE SODIUM 8.6-50 MG PO TABS: 1.0000 | ORAL_TABLET | Freq: Two times a day (BID) | ORAL | Status: DC | PRN

## 2023-10-26 MED ORDER — METHYLPHENIDATE HCL 5 MG PO TABS
5.0000 mg | ORAL_TABLET | Freq: Two times a day (BID) | ORAL | Status: DC
  Administered 2023-10-26 – 2023-10-29 (×7): 5 mg
  Filled 2023-10-26 (×7): qty 1

## 2023-10-26 NOTE — TOC Progression Note (Signed)
 Transition of Care [facility]) - Progression Note    Patient Details  Name: Coy Rochford MRN: [id number] Date of Birth: [birth year]  [facility] CM/SW Contact  Inocente GORMAN Kindle, LCSW Phone Number: 10/26/2023, 9:56 AM  Clinical Narrative:    TOC continuing to follow. Patient remains intubated.    Expected Discharge Plan: IP Rehab Facility Barriers to Discharge: Continued Medical Work up  Expected Discharge Plan and Services In-house Referral: Clinical Social Work     Living arrangements for the past 2 months: Single Family Home                                       Social Determinants of Health (SDOH) Interventions SDOH Screenings   Food Insecurity: No Food Insecurity (05/30/2022)  Housing: Low Risk  (05/30/2022)  Transportation Needs: No Transportation Needs (05/30/2022)  Utilities: Not At Risk (05/30/2022)  Alcohol Screen: Low Risk  (05/30/2022)  Depression (PHQ2-9): Low Risk  (10/01/2023)  Financial Resource Strain: Low Risk  (05/30/2022)  Physical Activity: Inactive (05/30/2022)  Social Connections: Moderately Isolated (05/30/2022)  Stress: No Stress Concern Present (05/30/2022)  Tobacco Use: Medium Risk (10/21/2023)    Readmission Risk Interventions     No data to display

## 2023-10-26 NOTE — Progress Notes (Signed)
 Nutrition Follow Up  DOCUMENTATION CODES:   Non-severe (moderate) malnutrition in context of social or environmental circumstances  INTERVENTION:  ordered Banatrol TF BID to help with diarrhea; D/C miralax  and senna  Continue tube feeds via Cortrak:  Osmolite 1.5 at 55 ml/h (1320 ml per day)  Prosource TF20 60 ml BID  Provides 2140 kcal, 122 gm protein, 1005 ml free water  daily   Free water  flushes: 200mL TID (1605 mL total water  daily)  Continued folic acid  1mg  daily  Continued MVI w/ minerals daily  Continued thiamine  100mg  daily  NUTRITION DIAGNOSIS:  Moderate Malnutrition related to social / environmental circumstances (lack of appetite, alcohol intake, recent decline) as evidenced by energy intake < 75% for > or equal to 3 months, moderate fat depletion, moderate muscle depletion, percent weight loss. Remains applicable  GOAL:  Patient will meet greater than or equal to 90% of their needs Met with tube feeds at goal rate  MONITOR:   Labs, TF tolerance  REASON FOR ASSESSMENT:   Consult Enteral/tube feeding initiation and management  ASSESSMENT:   Pt with hx of diabetes, CKD III, neuropathy, HTN, and high cholesterol. Pt admitted following being found unresponsive on ground, diagnosed with R lateral midline shift and multiple hemorrhages.  6/19 admitted, intubated, amio started 6/20 extubated, CIWA protocol initiated 6/23 re-intubated 6/25 bronchoscopy conducted for secretions, Cortrak placed, podiatry consulted for osteomyelitis of R 2nd toe 6/27 R 2nd toe amputation  6/28 FMS placed  Pt remains intubated, currently on amiodarone . Pt had amputation of R 2nd toe 6/27. Pt experiencing diarrhea, FMS placed 6/28. Pt discussed during ICU rounds with MD and RN. Discussed ordering banatrol per tube BID to help with diarrhea, RN and MD agree. Will continue to monitor GI tolerance issues. Spoke with wife and daughter at beside, discussed adding medication to help with  diarrhea and discussed tube feeds at goal rate, no questions or concerns at this time about nutrition from the family.  Patient is currently intubated on ventilator support MV: 9.9 L/min Temp (24hrs), Avg:99.8 F (37.7 C), Min:99.5 F (37.5 C), Max:100 F (37.8 C)  MAP (cuff):  Admit weight: 98.6kg  Current weight: 100.7kg   Intake/Output Summary (Last 24 hours) at 10/26/2023 0846 Last data filed at 10/26/2023 0600 Gross per 24 hour  Intake 2109.2 ml  Output 1650 ml  Net 459.2 ml   Net IO Since Admission: 5,273.71 mL [10/26/23 0846]  Drains/Lines: Cortrak Catheter: UOP 1,351mL FMS: (6/29) + (10 hours)  Medications reviewed and include:  Amiodarone  16.33mL/hr Thiamine  100mg  daily Zoloft  Protonix  MVI w/ minerals Semglee 15 units daily SSI Novolog  Novolog  tube feeding coverage 9 units q4 hours Folic acid  1mg  daily  Labs reviewed:  CBG over last 24: 151-221 mg/dL BUN 61/ Creatinine 8.43  Diet Order:   Diet Order             Diet NPO time specified  Diet effective now                  EDUCATION NEEDS:   Not appropriate for education at this time  Skin:  Skin Assessment: Skin Integrity Issues: Skin Integrity Issues:: Diabetic Ulcer Diabetic Ulcer: R toe  Last BM:  6/29 multiple type 7  Height:  Ht Readings from Last 1 Encounters:  10/22/23 6' (1.829 m)   Weight:  Wt Readings from Last 1 Encounters:  10/25/23 100.7 kg   Ideal Body Weight:  80.9 kg  BMI:  Body mass index is 30.11  kg/m.  Estimated Nutritional Needs:   Kcal:  2000-2200  Protein:  115-130g  Fluid:  >/= 2L   Josette Glance, MS, RDN, LDN Clinical Dietitian I Please reach out via secure chat

## 2023-10-26 NOTE — Progress Notes (Addendum)
 STROKE TEAM PROGRESS NOTE    SIGNIFICANT HOSPITAL EVENTS  6/19: level 2 trauma after being found on the ground incontinent and shaking.  HR 200s with wide complex QRS, amio bolus given and drip started.  GCS 6. Intubated on arrival.  CT Head shows an acute intraparenchymal hematoma within the right lateral temporal lobe with surrounding vasogenic edema and 5 mm midline shift and scattered subarachnoid hemorrhages.  PTA xarelto  reversed with [facility] on Admission 22. 6/20: Repeat CT head early this a.m. stable. Extubated in afternoon. 6/21: MRI stable IPH, multiple acute/subacute cortical infarcts seen. 3% stopped.  6/23- re intubated 6/25-found to have osteomyelitis of right second toe, amputation planned for Friday, bronchoscopy performed and cortrak inserted  INTERIM HISTORY/SUBJECTIVE Patient is seen in his room with his RN at the bedside.  Patient remains intubated.  Remains unresponsive and does not open his eyes.   He has not shown any response to Provigil and plans to change him to Ritalin today.  He is minimally responsive to sternal rub with partial localization in the right arm and right leg.  He tells have trace movement in the left leg with no movement of the left arm.   OBJECTIVE  CBC    Component Value Date/Time   WBC 4.8 10/26/2023 0557   RBC 4.00 (L) 10/26/2023 0557   HGB 12.2 (L) 10/26/2023 0557   HGB 17.4 08/12/2023 1622   HCT 38.2 (L) 10/26/2023 0557   HCT 53.7 (H) 08/12/2023 1622   PLT 81 (L) 10/26/2023 0557   PLT 127 (L) 08/12/2023 1622   MCV 95.5 10/26/2023 0557   MCV 93 08/12/2023 1622   MCH 30.5 10/26/2023 0557   MCHC 31.9 10/26/2023 0557   RDW 17.3 (H) 10/26/2023 0557   RDW 13.1 08/12/2023 1622   LYMPHSABS 0.9 08/31/2023 1632   LYMPHSABS 0.8 08/04/2022 1459   MONOABS 0.3 08/31/2023 1632   EOSABS 0.0 08/31/2023 1632   EOSABS 0.1 08/04/2022 1459   BASOSABS 0.0 08/31/2023 1632   BASOSABS 0.0 08/04/2022 1459    BMET    Component Value Date/Time    NA 143 10/26/2023 0557   NA 138 08/31/2023 1632   K 3.5 10/26/2023 0557   CL 109 10/26/2023 0557   CO2 24 10/26/2023 0557   GLUCOSE 197 (H) 10/26/2023 0557   BUN 38 (H) 10/26/2023 0557   BUN 15 08/31/2023 1632   CREATININE 1.56 (H) 10/26/2023 0557   CALCIUM  9.4 10/26/2023 0557   EGFR 51 (L) 08/31/2023 1632   GFRNONAA 47 (L) 10/26/2023 0557    IMAGING past 24 hours No results found.   Vitals:   10/26/23 1100 10/26/23 1200 10/26/23 1300 10/26/23 1400  BP: 121/76 130/76 114/75 110/64  Pulse: (!) 104 (!) 106 96 100  Resp: (!) 22 (!) 26 (!) 26 16  Temp: 99.5 F (37.5 C) 99.7 F (37.6 C) 99.9 F (37.7 C) 99.9 F (37.7 C)  TempSrc:      SpO2: 98% 97% 95% 95%  Weight:      Height:        PHYSICAL EXAM General: Elderly male, acutely ill CV: paroxysmal Afib on monitor Respiratory: Intubated, mechanically ventilated on full support   NEURO: (On no sedation) patient is lying with eyes closed.  He is unresponsive.  Does not follow commands.  Pupils equal round and reactive, opens eyes to voice but does not track examiner, blinks to threat bilaterally, does not follow commands or respond to noxious stimuli  Temp:  [99.1  F (37.3 C)-100 F (37.8 C)] 99.9 F (37.7 C) (06/30 1400) Pulse Rate:  [91-108] 100 (06/30 1400) Resp:  [16-26] 16 (06/30 1400) BP: (110-135)/(64-94) 110/64 (06/30 1400) SpO2:  [95 %-100 %] 95 % (06/30 1400) FiO2 (%):  [30 %] 30 % (06/30 1142)   Most Recent NIH: 21     ASSESSMENT/PLAN  [name] is a 73 y.o. male with  73 y.o. male with hx of anemia, afib on xarleto, anxiety, CKD stage 3, DM without complication, HLD, HTN, Hypothyroidism, PUD presenting as a level 2 trauma after being found on the ground incontinent and shaking. He was given 5mg  of versed  by EMS due to combativeness. He is also on doxycycline  for a toe infection. HR 200s with wide complex QRS, amio bolus given and drip started. GCS 6. Intubated on arrival. CT Head shows an  acute intraparenchymal hematoma within the right lateral temporal lobe with surrounding vasogenic edema and 5 mm midline shift and scattered subarachnoid hemorrhages. PTA xarelto  reversed with [facility] on Admission 22.  ICH:  right temporoparietal ICH with scattered SAH, etiology: hypertensive in the setting of DOAC CT Head Acute IPH within right lateral temporal lobe with surrounding vasogenic edema and mass effect, midline shift of approximately 5 mm to left. Scattered subarachnoid hemorrhage CTA head & neck Negative for spot sign, intracranial aneurysm, or evidence of vascular malformation. Right sided subdural hematoma (estimated at 4 mm) suspected in addition to intra-axial and subarachnoid blood. Repeat CT head 6/20 early AM: overall stable Right temporoparietal parenchymal hemorrhage with surrounding vasogenic edema and mass effect, not significantly changed.  Midline shift is now 6 mm. Scattered subarachnoid hemorrhage, similar to prior. Status post [facility]  reversal MRI  with and without contrast: Stable right temporoparietal IPH and associated vasogenic edema. Mass effect and midline shift similar to prior study, measuring 6 mm. Multiple punctate acute/subacute bilateral cortical infarcts 6/23- CT Head- Stable hematoma, with MLS 7mm CT repeat 6/26 right temporal IPH similar in size with vasogenic edema and 6 mm midline shift, subarachnoid hemorrhage less prominent Consider MRI repeat over the weekend if no significant neuro improvement 2D Echo: EF 50 to 55%, global hypokinesis, degenerative mitral valve LDL 54 in 08/2023 HgbA1c 5.6 in 08/2023 VTE prophylaxis - SCDs Xarelto  (rivaroxaban ) daily prior to admission, continue No antithrombotic due to ICH Therapy recommendations:  CIR Disposition: Pending  Cerebral Edema Repeat CT 6/23 overall stable hematoma, and slight increase cerebral edema with MLS 7mm CT repeat 6/26 demonstrates stable IPH NS consulted, no surgical intervention at  this time. 3% bolus of 250 given 6/19 3% hypertonic saline stopped 6/21 -> on NS @ 50-> off Na 138-134-136-144-150-148-154-151 -149- [phone] Allow Na gradually trending down  Seizure like activity Incontinence with shaking at onset per report No seizure since admission LTM EEG no seizure-like activity seen, discontinued 6/20 S/p keppra  load Keppra  7 days course completed  Acute Respiratory Failure ?Aspiration Pneumonia Intubated 6/19 d/t mental status, inability to protect airway Extubated 6/20 afternoon -> 6L [place] -> 15L NRB->re-intubated 6/23 B Cx showed 1/4 steph hemolyticus, concerning for contamination, repeat blood cultures NGTD WBC 4.6->5.7->5.8->5.4 CXR with New right perihilar airspace disease S/p bronch 6/25, no mucous plugs On unasyn    Atrial fibrillation with RVR Home Meds: Xarelto  20 mg daily Continue telemetry monitoring 6/26 overnight RVR 150s received amio bolus, now off amio IV No anticoagulation now due to ICH  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 N/A TOC consult for cessation placed  Dysphagia Patient has post-stroke dysphagia, SLP consulted Now n.p.o. On TF @ 55 -> off 6/27 for OR  Other Stroke Risk Factors Obesity, Body mass index is 30.11 kg/m., BMI >/= 30 associated with increased stroke risk, recommend weight loss, diet and exercise as appropriate Former smoker  Other Active  Problems CKD3b, creatinine 1.50--1.35--1.58--1.50--1.38--1.36--1.58-1.57, on TF  Hospital day # 11 Patient seen and examined by NP/APP with MD. MD to update note as needed.   Patient is several days out from his hemorrhage and repeat imaging shows expected evolutionary changes but patient neurological exam and mental status seem out of proportion without obvious explanation.  Patient had received phenobarbital  alcohol withdrawal protocol and it has been 5 days since last dose.  He is also afebrile with no signs of infection.  EEG also shows no seizure activity.  Long discussion with PCCM MD and PA.  Switch Provigil to Ritalin to see if she is more responsive.  In case patient does not show significant meaningful improvement over the weekend may need to have further discussion about goals of care.  Wife is quite realistic and feels patient would not want tracheostomy, PEG tube and prolonged nursing home care which may be unavoidable..  Plan trial of Provigil to promote wakefulness.  Plan to check MRI scan brain tomorrow with patient does not respond even to Ritalin.  Continue ventilator support for respiratory failure as per critical care team.  Discussed with Dr.Olalere critical care medicine.  . This patient is critically ill and at significant risk of neurological worsening, death and care requires constant monitoring of vital signs, hemodynamics,respiratory and cardiac monitoring, extensive review of multiple databases, frequent neurological assessment, discussion with family, other specialists and medical decision making of high complexity.I have made any additions or clarifications directly to the above note.This critical care time does not reflect procedure time, or teaching time or supervisory time of PA/NP/Med Resident etc but could involve care discussion time.  I spent 30 minutes of neurocritical care time  in the care of  this patient.           Eather Popp, MD

## 2023-10-26 NOTE — Evaluation (Signed)
 Occupational Therapy Re- Evaluation Patient Details Name: Jonathon [name] MRN: [id number] DOB: 1950/09/22 Today's Date: 10/26/2023   History of Present Illness   Pt is a 73 y.o. male who presented 10/15/23 s/p unwitnessed fall followed by worsening mental status. CT Head showed an acute intraparenchymal hematoma within the right lateral temporal lobe with surrounding vasogenic edema and 5 mm midline shift and scattered subarachnoid hemorrhages. Per neuro 6/19 appears to be more consistent with a primary hemorrhage rather than traumatic ICH. However, has some overlying SAH and SDH which is odd for a nontraumatic ICH. Intubated 6/19-6/20. 6/21 MRI stable. CIWA protocol. 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  Pt remains stuporous during session, only responding to noxious stimuli of BUEs. Pt total care for ADLs and bed mobility.  Attempted chair position, but noted RR decreased from 20 to 10 with upright position and returned back to [facility] 30*. Ues with increased edema, provided PROM and elevated on pillows to reduce dependent position.  Will monitor for splinting needs. Dr. Rosemarie in during session, plan to begin stimulant to help with pt responsiveness and repeat MRI on 7/1. Will follow acutely, as appropriate.      If plan is discharge home, recommend the following:   Two people to help with walking and/or transfers;Two people to help with bathing/dressing/bathroom (total care)     Functional Status Assessment   Patient has had a recent decline in their functional status and demonstrates the ability to make significant improvements in function in a reasonable and predictable amount of time.     Equipment Recommendations   Other (comment) (defer)     Recommendations for  Other Services         Precautions/Restrictions   Precautions Precautions: Fall Recall of Precautions/Restrictions: Impaired Precaution/Restrictions Comments: watch BP <160, CIWA Restrictions Weight Bearing Restrictions Per Provider Order: No     Mobility Bed Mobility Overal bed mobility: Needs Assistance Bed Mobility: Supine to Sit, Sit to Supine           General bed mobility comments: to chair position in ICU bed; BP stable however respirations dropped from 20 to 10 with return to [facility] 30 and respirations returned to 20    Transfers                   General transfer comment: deferred      Balance Overall balance assessment: Needs assistance Sitting-balance support: No upper extremity supported, Feet supported Sitting balance-Leahy Scale: Zero Sitting balance - Comments: lean to left as elevated to chair position                                   ADL either performed or assessed with clinical judgement   ADL Overall ADL's : Needs assistance/impaired                                       General ADL Comments: total care     Vision   Vision Assessment?: Vision impaired- to be further tested in functional context Additional Comments: eyes closed, does not turn head to auditory stimuli  Perception         Praxis         Pertinent Vitals/Pain Pain Assessment Pain Assessment: CPOT Facial Expression: Relaxed, neutral Body Movements: Absence of movements Muscle Tension: Relaxed Compliance with ventilator (intubated pts.): N/A Vocalization (extubated pts.): N/A CPOT Total: 0 Pain Intervention(s): Monitored during session     Extremity/Trunk Assessment Upper Extremity Assessment Upper Extremity Assessment: RUE deficits/detail;LUE deficits/detail RUE Deficits / Details: edema in UE, PROM of shoulder to 90* but limited in elbow flexion due to edema. PROM from shoulder to hand provided, elevated on pillows.  Does respond to noxious stimuli. Will monitor for splinting needs. LUE Deficits / Details: edema in UE, PROM of shoulder to 90* but limited in elbow flexion due to edema. PROM from shoulder to hand provided, elevated on pillows. Does respond to noxious stimuli. Will monitor for splinting needs   Lower Extremity Assessment Lower Extremity Assessment: Defer to PT evaluation RLE Deficits / Details: bandage to Rt foot s/p 2nd toe amputation; PROM hip to 90 (moderate resistance noted at endrange), hip abdct and rotation WFL;  knee flexion 100 (no resistance, limited by hip ROM); ankle DF 5 degrees LLE Deficits / Details: PROM hip flexion 100, abdct, IR WFL; knee 100 flexion, ankle DF 0   Cervical / Trunk Assessment Cervical / Trunk Exceptions: large body habitus   Communication Communication Communication: Impaired Factors Affecting Communication: Hearing impaired (has hearing aides)   Cognition Arousal: Stuporous Behavior During Therapy: Flat affect Cognition: Difficult to assess Difficult to assess due to: Level of arousal           OT - Cognition Comments: pt keeping eyes closed, responds to bil UEs with noxious stimuli but otherwise no responses noted                 Following commands: Impaired Following commands impaired:  (not following commands)     Cueing  General Comments   Cueing Techniques: Verbal cues;Tactile cues  pt intubated; PEEP 5, PSV mode   Exercises Exercises: Other exercises   Shoulder Instructions      Home Living Family/patient expects to be discharged to:: Private residence Living Arrangements: Spouse/significant other Available Help at Discharge: Family Type of Home: House Home Access: Stairs to enter Secretary/administrator of Steps: 3 Entrance Stairs-Rails: Left Home Layout: One level     Bathroom Shower/Tub: Producer, television/film/video: Handicapped height     Home Equipment: Shower seat;Grab bars - tub/shower;Hand held Office manager (2 wheels)          Prior Functioning/Environment Prior Level of Function : Independent/Modified Independent;History of Falls (last six months)             Mobility Comments: cane, was going to outpatient PT ADLs Comments: some assist for LB dressing, otherwise independent ADLs.  spouse assists with IADLs (spouse driving and managing meds)    OT Problem List: Decreased strength;Decreased range of motion;Decreased activity tolerance;Impaired balance (sitting and/or standing);Impaired vision/perception;Decreased coordination;Decreased cognition;Decreased safety awareness;Decreased knowledge of use of DME or AE;Decreased knowledge of precautions;Cardiopulmonary status limiting activity;Impaired UE functional use;Obesity;Impaired sensation;Impaired tone;Increased edema   OT Treatment/Interventions: Self-care/ADL training;Therapeutic exercise;Cognitive remediation/compensation;Therapeutic activities;Patient/family education;Balance training;Visual/perceptual remediation/compensation;Neuromuscular education;Splinting      OT Goals(Current goals can be found in the care plan section)   Acute Rehab OT Goals Patient Stated Goal: unable OT Goal Formulation: Patient unable to participate in goal setting Time For Goal Achievement: 11/01/23 Potential to Achieve Goals: Fair   OT Frequency:  Min 1X/week    Co-evaluation PT/OT/SLP Co-Evaluation/Treatment: Yes Reason for Co-Treatment: For patient/therapist safety;To address functional/ADL transfers PT goals addressed during session: Mobility/safety with mobility;Strengthening/ROM OT goals addressed during session: ADL's and self-care      AM-PAC OT 6 Clicks Daily Activity     Outcome Measure Help from another person eating meals?: Total Help from another person taking care of personal grooming?: Total Help from another person toileting, which includes using toliet, bedpan, or urinal?: Total Help from another person  bathing (including washing, rinsing, drying)?: Total Help from another person to put on and taking off regular upper body clothing?: Total Help from another person to put on and taking off regular lower body clothing?: Total 6 Click Score: 6   End of Session Nurse Communication: Mobility status;Precautions (elevation to UEs)  Activity Tolerance: Patient limited by lethargy Patient left: in bed;with call bell/phone within reach;with nursing/sitter in room  OT Visit Diagnosis: Other abnormalities of gait and mobility (R26.89);Muscle weakness (generalized) (M62.81);Other symptoms and signs involving the nervous system (R29.898);Other symptoms and signs involving cognitive function                Time: 9086-9060 OT Time Calculation (min): 26 min Charges:  OT General Charges $OT Visit: 1 Visit OT Evaluation $OT Re-eval: 1 Re-eval  Etta NOVAK, OT Acute Rehabilitation Services Office [phone] Secure Chat Preferred    Etta GORMAN Hope 10/26/2023, 10:26 AM

## 2023-10-26 NOTE — Evaluation (Signed)
 Physical Therapy Re-Evaluation Patient Details Name: Jonathon [name] MRN: [id number] DOB: [birth year] Today's Date: 10/26/2023  History of Present Illness  Pt is a 73 y.o. male who presented 10/15/23 s/p unwitnessed fall followed by worsening mental status. CT Head showed an acute intraparenchymal hematoma within the right lateral temporal lobe with surrounding vasogenic edema and 5 mm midline shift and scattered subarachnoid hemorrhages. Per neuro 6/19 appears to be more consistent with a primary hemorrhage rather than traumatic ICH. However, has some overlying SAH and SDH which is odd for a nontraumatic ICH. Intubated 6/19-6/20. 6/21 MRI stable. CIWA protocol. 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 PTA patient was modified independent with a cane for ambulation.  Pt currently is dependent for all mobility with only responses to pain bil UE and decr RR with chair/sitting position in bed. PROM bil ankles WFL, therefore positioning device not yet recommended. Dr. Rosemarie in during session and to begin ritalin/stimulant to see if pt with incr responsiveness. Plans to repeat MRI 7/1. Anticipate patient may  benefit from PT to address problems listed below. Will continue to follow acutely 1-2x/week to maximize functional mobility, independence, and safety. Will reassess later this week.          If plan is discharge home, recommend the following: Two people to help with walking and/or transfers;Two people to help with bathing/dressing/bathroom;Assistance with feeding;Assistance with cooking/housework;Direct supervision/assist for medications management;Assist for transportation;Direct supervision/assist for financial management;Help with stairs or ramp for entrance;Supervision due to cognitive status   Can  travel by private vehicle        Equipment Recommendations Hospital bed;Hoyer lift;Wheelchair (measurements PT);Wheelchair cushion (measurements PT)  Recommendations for Other Services       Functional Status Assessment Patient has had a recent decline in their functional status and demonstrates the ability to make significant improvements in function in a reasonable and predictable amount of time.     Precautions / Restrictions Precautions Precautions: Fall Recall of Precautions/Restrictions: Impaired Precaution/Restrictions Comments: watch BP <160, CIWA Restrictions Weight Bearing Restrictions Per Provider Order: No      Mobility  Bed Mobility Overal bed mobility: Needs Assistance Bed Mobility: Supine to Sit, Sit to Supine           General bed mobility comments: to chair position in ICU bed; BP stable however respirations dropped from 20 to 10 with return to [facility] 30 and respirations returned to 20    Transfers                   General transfer comment: deferred    Ambulation/Gait                  Stairs            Wheelchair Mobility     Tilt Bed    Modified Rankin (Stroke Patients Only) Modified Rankin (Stroke Patients Only) Pre-Morbid Rankin Score: No symptoms Modified Rankin: Severe disability     Balance Overall balance assessment: Needs assistance Sitting-balance support: No upper extremity supported, Feet supported Sitting balance-Leahy Scale: Zero Sitting balance - Comments: lean to left as elevated to chair position  Pertinent Vitals/Pain Pain Assessment Pain Assessment: CPOT Facial Expression: Relaxed, neutral Body Movements: Absence of movements Muscle Tension: Relaxed Compliance with ventilator (intubated pts.): Tolerating ventilator or movement Vocalization (extubated pts.): N/A CPOT Total: 0 Pain Intervention(s): Monitored during session (pt able to withdraw to  pain bil UEs; not for LEs)    Home Living Family/patient expects to be discharged to:: Private residence Living Arrangements: Spouse/significant other Available Help at Discharge: Family Type of Home: House Home Access: Stairs to enter Entrance Stairs-Rails: Left Entrance Stairs-Number of Steps: 3   Home Layout: One level Home Equipment: Shower seat;Grab bars - tub/shower;Hand held Programmer, systems (2 wheels)      Prior Function Prior Level of Function : Independent/Modified Independent;History of Falls (last six months)             Mobility Comments: cane, was going to outpatient PT ADLs Comments: some assist for LB dressing, otherwise independent ADLs.  spouse assists with IADLs (spouse driving and managing meds)     Extremity/Trunk Assessment   Upper Extremity Assessment Upper Extremity Assessment: Defer to OT evaluation    Lower Extremity Assessment Lower Extremity Assessment: RLE deficits/detail;LLE deficits/detail RLE Deficits / Details: bandage to Rt foot s/p 2nd toe amputation; PROM hip to 90 (moderate resistance noted at endrange), hip abdct and rotation WFL;  knee flexion 100 (no resistance, limited by hip ROM); ankle DF 5 degrees LLE Deficits / Details: PROM hip flexion 100, abdct, IR WFL; knee 100 flexion, ankle DF 0    Cervical / Trunk Assessment Cervical / Trunk Exceptions: large body habitus  Communication   Communication Communication: Impaired Factors Affecting Communication: Hearing impaired (has hearing aides)    Cognition Arousal: Stuporous Behavior During Therapy: Flat affect   PT - Cognitive impairments: Difficult to assess Difficult to assess due to: Level of arousal                     PT - Cognition Comments: no spontaneous movements noted Following commands: Impaired Following commands impaired:  (not following commands)     Cueing Cueing Techniques: Verbal cues, Tactile cues     General Comments       Exercises Other Exercises Other Exercises: PROM 5 -10 reps bil each plane of movement   Assessment/Plan    PT Assessment Patient needs continued PT services  PT Problem List Decreased strength;Decreased activity tolerance;Decreased balance;Decreased mobility;Decreased coordination;Decreased cognition;Decreased safety awareness;Obesity;Decreased range of motion       PT Treatment Interventions DME instruction;Gait training;Stair training;Functional mobility training;Therapeutic activities;Therapeutic exercise;Balance training;Neuromuscular re-education;Cognitive remediation;Patient/family education    PT Goals (Current goals can be found in the Care Plan section)  Acute Rehab PT Goals Patient Stated Goal: none stated PT Goal Formulation: Patient unable to participate in goal setting Time For Goal Achievement: 11/09/23 Potential to Achieve Goals: Poor    Frequency Min 1X/week     Co-evaluation PT/OT/SLP Co-Evaluation/Treatment: Yes Reason for Co-Treatment: For patient/therapist safety;To address functional/ADL transfers PT goals addressed during session: Mobility/safety with mobility;Strengthening/ROM         AM-PAC PT 6 Clicks Mobility  Outcome Measure Help needed turning from your back to your side while in a flat bed without using bedrails?: Total Help needed moving from lying on your back to sitting on the side of a flat bed without using bedrails?: Total Help needed moving to and from a bed to a chair (including a wheelchair)?: Total Help needed standing up from a chair using your arms (e.g., wheelchair or bedside chair)?:  Total Help needed to walk in hospital room?: Total Help needed climbing 3-5 steps with a railing? : Total 6 Click Score: 6    End of Session Equipment Utilized During Treatment: Oxygen Activity Tolerance: Patient limited by lethargy Patient left: in bed;with call bell/phone within reach;with nursing/sitter in room Nurse Communication: Other  (comment) (withdrew to pain bil UEs; decr in RR with chair position) PT Visit Diagnosis: Unsteadiness on feet (R26.81);Muscle weakness (generalized) (M62.81);History of falling (Z91.81)    Time: 9086-9060 PT Time Calculation (min) (ACUTE ONLY): 26 min   Charges:   PT Evaluation $PT Re-evaluation: 1 Re-eval   PT General Charges $$ ACUTE PT VISIT: 1 Visit          Macario RAMAN, PT Acute Rehabilitation Services  Office [phone]   Macario SHAUNNA Soja 10/26/2023, 9:59 AM

## 2023-10-26 NOTE — Progress Notes (Signed)
 eLink Physician-Brief Progress Note [name] DOB: 05-Mar-1951 MRN: [id number]   Date of Service  10/26/2023  HPI/Events of Note  73 year old male found down secondary to right sided ICH with scattered subarachnoid hemorrhage complicated by acute hypoxic respiratory failure requiring mechanical ventilation  Left upper extremity swelling, erythema, and warmth noted.  PICC is in place.  Receiving prophylactic heparin .  eICU Interventions  Obtain upper extremity ultrasound.  No hemodynamic disturbance at this time, on Unasyn  already, no immediate change to plan.     Intervention Category Intermediate Interventions: Diagnostic test evaluation  Arsen Mangione 10/26/2023, 4:15 AM

## 2023-10-26 NOTE — Progress Notes (Signed)
 NAME:  Jahshua Bonito, MRN:  [id number], DOB:  03/21/1951, LOS: 11 ADMISSION DATE:  10/15/2023, CONSULTATION DATE: 10/15/2023 REFERRING MD: EDP, CHIEF COMPLAINT: Intracerebral hemorrhage  History of Present Illness:  73 year old man who was found down by wife poorly responsive required intubation and transportation to the emergency.  CT scan revealed a right intraparenchymal hemorrhage with other areas of injury.  He is currently intubated in atrial fibrillation ventricular response on amiodarone  drip being treated with fentanyl  and propofol  for tube tolerance.  He has been seen by neurosurgery and neurology.  Hyperatremia treatment we await the final results of CT scan of the head to see if there is any surgical options.  Pertinent Medical History:   Past Medical History:  Diagnosis Date   Anemia due to blood loss, acute 2014   2nd GIB   Anxiety    CKD (chronic kidney disease), stage III (HCC) 2014   Diabetes mellitus without complication (HCC)    Gout    High cholesterol    Hypertension 2014   Hypothyroid    Neuropathy    PUD (peptic ulcer disease) 2014    Significant Hospital Events: Including procedures, antibiotic start and stop dates in addition to other pertinent events   6/19 Admitted w/ ICH felt 2/2 HTN and c/b SDH p/ fall. Started AEDs. HTNS. Seen by n surg felt unlikely that surg would improve outcome. Seen by neuro. Placed on clevi. Got AC reversed in ER. Unasyn  started for asp 6/20 weaning CT imaging stable ECHO ordered. 1/2 BC + felt contamination but does bring endocarditis into question. Extubated with increased agitation post-extubation, placed on precedex . Patient does drink alcohol every night, placed on CIWA 6/21 Echo with EF 50-55%, low normal LV function +global hypokinesis, degenerative MV without MR, no evidence of valvular vegetations. 6/23: reintubated after poor responsiveness, gurgling and desaturations 6/24: MRI today for R foot osteo r/o  6/25: MRI  yesterday with osteo - adding linezolid . Will consult podiatry. Bronch completed. Waking up.  6/26: SBT with apnea. ABI today for poss amputation R toe 6/27. Amio  6/28 SBT with apnea 6/29: no change, started on modafinil.  6/30: brain stem reflexes present. Put on sbt, will let him ride.   Interim History / Subjective:   No overnight events Appears to be slightly more responsive although not able to follow commands, does respond to painful stimulation, some flickering of his eyelids with loud noise/name-calling  Objective:   Blood pressure (!) 135/94, pulse 91, temperature 99.5 F (37.5 C), resp. rate 17, height 6' (1.829 m), weight 100.7 kg, SpO2 98%.    Vent Mode: PSV;CPAP FiO2 (%):  [30 %] 30 % Set Rate:  [16 bmp] 16 bmp Vt Set:  [620 mL] 620 mL PEEP:  [5 cmH20] 5 cmH20 Pressure Support:  [10 cmH20] 10 cmH20 Plateau Pressure:  [15 cmH20-18 cmH20] 15 cmH20   Intake/Output Summary (Last 24 hours) at 10/26/2023 0807 Last data filed at 10/26/2023 0600 Gross per 24 hour  Intake 2109.2 ml  Output 1650 ml  Net 459.2 ml   Filed Weights   10/21/23 0500 10/22/23 0403 10/25/23 0500  Weight: 97.2 kg 100.1 kg 100.7 kg   Physical Examination: General: non responsive off sedation. HEENT: Moist oral mucosa, endotracheal tube in place Neuro: Not following commands, cough +, weak gag, corneal +,  CV: S1-S2 appreciated, irregular PULM: Clear breath sounds soft HP:dnqu  Extremities: Right foot and postsurgical dressings  I reviewed last 24 h vitals and pain scores, last 48 h  intake and output, last 24 h labs and trends, and last 24 h imaging results.  Resolved problem List:   Assessment and Plan:   Right intraparenchymal hemorrhage with brain compression Scattered subarachnoid hemorrhages - On antiepileptic drugs - Continue supportive therapies - Stroke team continues to follow - EEG performed 6/28-no evidence of seizures - Consider MRI if mental status remains poor - Has been  off sedative medications - send b12, ammonia, tsh - modafinil 100 daily.  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 - As needed hydralazine , labetalol  - Scheduled metoprolol  25 bid  Chronic kidney disease - Follow BMP - Avoid nephrotoxic medications  EtOH use history - Continue thiamine , folate  Diabetes - Continue SSI  Is DNR status    Best Practice (right click and Reselect all SmartList Selections daily)   Diet/type: tubefeeds DVT prophylaxis: SCD, heparin  Pressure ulcer(s): present on admission  GI prophylaxis: PPI Lines: N/A Foley:  Yes, and it is still needed Code Status:  DNR Last date of multidisciplinary goals of care discussion: [Bedside 6/28 with spouse at bedside ]  The patient is critically ill with multiple organ systems failure and requires high complexity decision making for assessment and support, frequent evaluation and titration of therapies, application of advanced monitoring technologies and extensive interpretation of multiple databases. Critical Care Time devoted to patient care services described in this note independent of APP/resident time (if applicable)  is 35 minutes.   Kevin Legions MD [facility] CCM On-call: #[phone]

## 2023-10-27 ENCOUNTER — Inpatient Hospital Stay (HOSPITAL_COMMUNITY)

## 2023-10-27 DIAGNOSIS — L8990 Pressure ulcer of unspecified site, unspecified stage: Secondary | ICD-10-CM | POA: Insufficient documentation

## 2023-10-27 DIAGNOSIS — M7989 Other specified soft tissue disorders: Secondary | ICD-10-CM | POA: Diagnosis not present

## 2023-10-27 DIAGNOSIS — I611 Nontraumatic intracerebral hemorrhage in hemisphere, cortical: Secondary | ICD-10-CM | POA: Diagnosis not present

## 2023-10-27 DIAGNOSIS — I129 Hypertensive chronic kidney disease with stage 1 through stage 4 chronic kidney disease, or unspecified chronic kidney disease: Secondary | ICD-10-CM | POA: Diagnosis not present

## 2023-10-27 DIAGNOSIS — R29721 NIHSS score 21: Secondary | ICD-10-CM | POA: Diagnosis not present

## 2023-10-27 DIAGNOSIS — I609 Nontraumatic subarachnoid hemorrhage, unspecified: Secondary | ICD-10-CM | POA: Diagnosis not present

## 2023-10-27 DIAGNOSIS — M869 Osteomyelitis, unspecified: Secondary | ICD-10-CM | POA: Diagnosis not present

## 2023-10-27 LAB — URINALYSIS, COMPLETE (UACMP) WITH MICROSCOPIC
Bilirubin Urine: NEGATIVE
Glucose, UA: NEGATIVE mg/dL
Hgb urine dipstick: NEGATIVE
Ketones, ur: NEGATIVE mg/dL
Nitrite: NEGATIVE
Protein, ur: NEGATIVE mg/dL
Specific Gravity, Urine: 1.02 (ref 1.005–1.030)
pH: 6 (ref 5.0–8.0)

## 2023-10-27 LAB — GLUCOSE, CAPILLARY
Glucose-Capillary: 116 mg/dL — ABNORMAL HIGH (ref 70–99)
Glucose-Capillary: 135 mg/dL — ABNORMAL HIGH (ref 70–99)
Glucose-Capillary: 143 mg/dL — ABNORMAL HIGH (ref 70–99)
Glucose-Capillary: 145 mg/dL — ABNORMAL HIGH (ref 70–99)
Glucose-Capillary: 161 mg/dL — ABNORMAL HIGH (ref 70–99)
Glucose-Capillary: 175 mg/dL — ABNORMAL HIGH (ref 70–99)

## 2023-10-27 LAB — MAGNESIUM: Magnesium: 1.9 mg/dL (ref 1.7–2.4)

## 2023-10-27 LAB — SURGICAL PATHOLOGY

## 2023-10-27 LAB — SODIUM
Sodium: 146 mmol/L — ABNORMAL HIGH (ref 135–145)
Sodium: 144 mmol/L (ref 135–145)
Sodium: 147 mmol/L — ABNORMAL HIGH (ref 135–145)

## 2023-10-27 LAB — AMMONIA: Ammonia: 23 umol/L (ref 9–35)

## 2023-10-27 LAB — TSH: TSH: 2.095 u[IU]/mL (ref 0.350–4.500)

## 2023-10-27 LAB — VITAMIN B12: Vitamin B-12: 539 pg/mL (ref 180–914)

## 2023-10-27 MED ORDER — SODIUM CHLORIDE 3 % IV SOLN
INTRAVENOUS | Status: DC
  Filled 2023-10-27 (×6): qty 500

## 2023-10-27 MED ORDER — NYSTATIN 100000 UNIT/ML MT SUSP
5.0000 mL | Freq: Four times a day (QID) | OROMUCOSAL | Status: DC
  Administered 2023-10-27 – 2023-10-29 (×6): 500000 [IU] via OROMUCOSAL
  Filled 2023-10-27 (×4): qty 5

## 2023-10-27 MED ORDER — SODIUM CHLORIDE 3 % IV BOLUS
250.0000 mL | Freq: Once | INTRAVENOUS | Status: AC
  Administered 2023-10-27: 250 mL via INTRAVENOUS
  Filled 2023-10-27: qty 500

## 2023-10-27 MED ORDER — AMIODARONE HCL 200 MG PO TABS
400.0000 mg | ORAL_TABLET | Freq: Two times a day (BID) | ORAL | Status: DC
  Filled 2023-10-27: qty 2

## 2023-10-27 MED ORDER — AMIODARONE HCL 200 MG PO TABS: 200.0000 mg | ORAL_TABLET | Freq: Every day | ORAL | Status: DC

## 2023-10-27 MED ORDER — METOPROLOL TARTRATE 25 MG/10 ML ORAL SUSPENSION
25.0000 mg | Freq: Three times a day (TID) | ORAL | Status: DC
  Administered 2023-10-27 – 2023-10-29 (×6): 25 mg
  Filled 2023-10-27 (×6): qty 10

## 2023-10-27 MED ORDER — AMIODARONE HCL 200 MG PO TABS
400.0000 mg | ORAL_TABLET | Freq: Two times a day (BID) | ORAL | Status: DC
  Administered 2023-10-27 – 2023-10-29 (×5): 400 mg
  Filled 2023-10-27 (×4): qty 2

## 2023-10-27 NOTE — Progress Notes (Signed)
 STROKE TEAM PROGRESS NOTE    SIGNIFICANT HOSPITAL EVENTS  6/19: level 2 trauma after being found on the ground incontinent and shaking.  HR 200s with wide complex QRS, amio bolus given and drip started.  GCS 6. Intubated on arrival.  CT Head shows an acute intraparenchymal hematoma within the right lateral temporal lobe with surrounding vasogenic edema and 5 mm midline shift and scattered subarachnoid hemorrhages.  PTA xarelto  reversed with [facility] on Admission 22. 6/20: Repeat CT head early this a.m. stable. Extubated in afternoon. 6/21: MRI stable IPH, multiple acute/subacute cortical infarcts seen. 3% stopped.  6/23- re intubated 6/25-found to have osteomyelitis of right second toe, amputation planned for Friday, bronchoscopy performed and cortrak inserted  INTERIM HISTORY/SUBJECTIVE Patient is seen in his room with his RN at the bedside.  Patient remains intubated.  Remains unresponsive and does not open his eyes.   He has not shown any response to wakefulness with Provigil or Ritalin MRI scans of brain done today shows stable appearance of the large right temporal lobar hematoma with persistent cytotoxic edema and 6 mm right-to-left shift.  He is minimally responsive to sternal rub with partial localization in the right arm and right leg.  He tells have trace movement in the left leg with no movement of the left arm.   OBJECTIVE  CBC    Component Value Date/Time   WBC 4.8 10/26/2023 0557   RBC 4.00 (L) 10/26/2023 0557   HGB 12.2 (L) 10/26/2023 0557   HGB 17.4 08/12/2023 1622   HCT 38.2 (L) 10/26/2023 0557   HCT 53.7 (H) 08/12/2023 1622   PLT 81 (L) 10/26/2023 0557   PLT 127 (L) 08/12/2023 1622   MCV 95.5 10/26/2023 0557   MCV 93 08/12/2023 1622   MCH 30.5 10/26/2023 0557   MCHC 31.9 10/26/2023 0557   RDW 17.3 (H) 10/26/2023 0557   RDW 13.1 08/12/2023 1622   LYMPHSABS 0.9 08/31/2023 1632   LYMPHSABS 0.8 08/04/2022 1459   MONOABS 0.3 08/31/2023 1632   EOSABS 0.0  08/31/2023 1632   EOSABS 0.1 08/04/2022 1459   BASOSABS 0.0 08/31/2023 1632   BASOSABS 0.0 08/04/2022 1459    BMET    Component Value Date/Time   NA 146 (H) 10/27/2023 0948   NA 138 08/31/2023 1632   K 3.5 10/26/2023 0557   CL 109 10/26/2023 0557   CO2 24 10/26/2023 0557   GLUCOSE 197 (H) 10/26/2023 0557   BUN 38 (H) 10/26/2023 0557   BUN 15 08/31/2023 1632   CREATININE 1.56 (H) 10/26/2023 0557   CALCIUM  9.4 10/26/2023 0557   EGFR 51 (L) 08/31/2023 1632   GFRNONAA 47 (L) 10/26/2023 0557    IMAGING past 24 hours DG Chest 1 View Result Date: 10/27/2023 CLINICAL DATA:  Aspiration EXAM: CHEST  1 VIEW COMPARISON:  10/24/2023 FINDINGS: Endotracheal tube terminates 4.3 cm above carina. Feeding tube extends beyond the inferior aspect of the film. A left-sided PICC line tip at low SVC. Numerous leads and wires project over the chest. Midline trachea. Normal heart size. No pleural effusion or pneumothorax. Left base airspace disease is mildly increased. IMPRESSION: Mild increase in left base airspace disease, most likely atelectasis. Developing infection or aspiration could look similar. Electronically Signed   By: Rockey Kilts M.D.   On: 10/27/2023 11:51   MR BRAIN WO CONTRAST Result Date: 10/27/2023 CLINICAL DATA:  73 year old male with large right temporal lobe hemorrhage presenting on 10/15/2023. Subsequent encounter. EXAM: MRI HEAD WITHOUT CONTRAST TECHNIQUE: Multiplanar,  multiecho pulse sequences of the brain and surrounding structures were obtained without intravenous contrast. COMPARISON:  Head CT 10/22/2023, brain MRI 10/17/2023 and earlier. FINDINGS: Brain: Residual blood products in the right temporal lobe with susceptibility and diffusion restriction. Configuration stable from the CT on 10/22/2023, encompassing about 83 by 37 x 40 mm (AP by transverse by CC), estimated 61 mL volume. Regional T2 and FLAIR hyperintense edema with mass effect on the right lateral ventricle which is mildly  progressed from the MRI 10/17/2023, stable from the recent CT. Leftward midline shift measures 6 mm on coronal image 20, stable from the recent CT. Basilar cisterns remain patent. Small volume subarachnoid blood and trace right side peripheral subdural hematoma (series 11, images 32 and 42). Susceptibility on DWI associated with some of that small volume extra-axial blood. Trace intraventricular hemorrhage in both occipital horns. No superimposed restricted diffusion to suggest acute infarction. No ventriculomegaly. Cervicomedullary junction and pituitary are within normal limits. Maintained gray and white matter signal elsewhere in the brain. And no occult chronic cerebral blood products on SWI. Vascular: Major intracranial vascular flow voids are stable. Skull and upper cervical spine: Stable and normal for age visible cervical spine. Visualized bone marrow signal is within normal limits. Sinuses/Orbits: Stable orbits. Intubated. Stable paranasal sinus and right mastoid opacification. Left mastoid effusion has mildly increased. Other: Expected fluid in the pharynx. Negative visible scalp and face. IMPRESSION: 1. Residual large right temporal lobe hemorrhage with mass effect and 6 mm of leftward midline shift stable from the CT on 10/22/2023. Associated small volume subarachnoid hemorrhage, trace right side subdural hematoma, and trace IVH. 2. No new intracranial abnormality. Electronically Signed   By: VEAR Hurst M.D.   On: 10/27/2023 04:03     Vitals:   10/27/23 0700 10/27/23 0800 10/27/23 0900 10/27/23 1000  BP: 137/72 (!) 141/65 121/67 134/78  Pulse: 93 (!) 104 99 (!) 102  Resp: 16 15 19 20   Temp: 99.7 F (37.6 C) 99.9 F (37.7 C) 99.7 F (37.6 C) 99.3 F (37.4 C)  TempSrc:      SpO2: 100% 99% 98% 98%  Weight:      Height:        PHYSICAL EXAM General: Elderly male, acutely ill CV: paroxysmal Afib on monitor Respiratory: Intubated, mechanically ventilated on full support   NEURO: (On no  sedation) patient is lying with eyes closed.  He is unresponsive.  Does not follow commands.  Pupils equal round and reactive, opens eyes to voice but does not track examiner, blinks to threat bilaterally, does not follow commands or respond to noxious stimuli  Temp:  [99.1 F (37.3 C)-100.6 F (38.1 C)] 99.3 F (37.4 C) (07/01 1000) Pulse Rate:  [83-113] 102 (07/01 1000) Resp:  [15-26] 20 (07/01 1000) BP: (101-141)/(54-88) 134/78 (07/01 1000) SpO2:  [91 %-100 %] 98 % (07/01 1000) FiO2 (%):  [30 %] 30 % (07/01 0804) Weight:  [101 kg] 101 kg (07/01 0500)   Most Recent NIH: 21     ASSESSMENT/PLAN  [name] is a 73 y.o. male with  73 y.o. male with hx of anemia, afib on xarleto, anxiety, CKD stage 3, DM without complication, HLD, HTN, Hypothyroidism, PUD presenting as a level 2 trauma after being found on the ground incontinent and shaking. He was given 5mg  of versed  by EMS due to combativeness. He is also on doxycycline  for a toe infection. HR 200s with wide complex QRS, amio bolus given and drip started. GCS 6. Intubated on  arrival. CT Head shows an acute intraparenchymal hematoma within the right lateral temporal lobe with surrounding vasogenic edema and 5 mm midline shift and scattered subarachnoid hemorrhages. PTA xarelto  reversed with [facility] on Admission 22.  ICH:  right temporoparietal ICH with scattered SAH, etiology: hypertensive in the setting of DOAC CT Head Acute IPH within right lateral temporal lobe with surrounding vasogenic edema and mass effect, midline shift of approximately 5 mm to left. Scattered subarachnoid hemorrhage CTA head & neck Negative for spot sign, intracranial aneurysm, or evidence of vascular malformation. Right sided subdural hematoma (estimated at 4 mm) suspected in addition to intra-axial and subarachnoid blood. Repeat CT head 6/20 early AM: overall stable Right temporoparietal parenchymal hemorrhage with surrounding vasogenic edema and  mass effect, not significantly changed.  Midline shift is now 6 mm. Scattered subarachnoid hemorrhage, similar to prior. Status post [facility]  reversal MRI  with and without contrast: Stable right temporoparietal IPH and associated vasogenic edema. Mass effect and midline shift similar to prior study, measuring 6 mm. Multiple punctate acute/subacute bilateral cortical infarcts Repeat MRI brain 10/27/2023 stable appearance of the large right temporal lobar hematoma with cytotoxic edema and 6 mm right-to-left shift 6/23- CT Head- Stable hematoma, with MLS 7mm CT repeat 6/26 right temporal IPH similar in size with vasogenic edema and 6 mm midline shift, subarachnoid hemorrhage less prominent Consider MRI repeat over the weekend if no significant neuro improvement 2D Echo: EF 50 to 55%, global hypokinesis, degenerative mitral valve LDL 54 in 08/2023 HgbA1c 5.6 in 08/2023 VTE prophylaxis - SCDs Xarelto  (rivaroxaban ) daily prior to admission, continue No antithrombotic due to ICH Therapy recommendations:  CIR Disposition: Pending  Cerebral Edema Repeat CT 6/23 overall stable hematoma, and slight increase cerebral edema with MLS 7mm CT repeat 6/26 demonstrates stable IPH NS consulted, no surgical intervention at this time. 3% bolus of 250 given 6/19 3% hypertonic saline stopped 6/21 -> on NS @ 50-> off Na 138-134-136-144-150-148-154-151 -149- [phone] Allow Na gradually trending down  Seizure like activity Incontinence with shaking at onset per report No seizure since admission LTM EEG no seizure-like activity seen, discontinued 6/20 S/p keppra  load Keppra  7 days course completed  Acute Respiratory Failure ?Aspiration Pneumonia Intubated 6/19 d/t mental status, inability to protect airway Extubated 6/20 afternoon -> 6L [place] -> 15L NRB->re-intubated 6/23 B Cx showed 1/4 steph hemolyticus, concerning for contamination, repeat blood cultures NGTD WBC 4.6->5.7->5.8->5.4 CXR with New right  perihilar airspace disease S/p bronch 6/25, no mucous plugs On unasyn    Atrial fibrillation with RVR Home Meds: Xarelto  20 mg daily Continue telemetry monitoring 6/26 overnight RVR 150s received amio bolus, now off amio IV No anticoagulation now due to ICH  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 N/A TOC consult for cessation placed  Dysphagia Patient has post-stroke dysphagia, SLP consulted Now n.p.o. On TF @ 55 -> off 6/27 for OR  Other Stroke Risk Factors Obesity, Body mass index is 30.2 kg/m.,  BMI >/= 30 associated with increased stroke risk, recommend weight loss, diet and exercise as appropriate Former smoker  Other Active Problems CKD3b, creatinine 1.50--1.35--1.58--1.50--1.38--1.36--1.58-1.57, on TF  Hospital day # 12 Patient seen and examined by NP/APP with MD. MD to update note as needed.   Patient is 12days out from his hemorrhage and repeat imaging shows expected evolutionary changes and persistent 6 mm right-to-left shift but patient neurological exam and mental status seem out of proportion without obvious explanation.  Patient had received phenobarbital  alcohol withdrawal protocol and it has been 6 days since last dose.  He is also afebrile with no signs of infection.  EEG also shows no seizure activity.  Recommend repeat trial of hypertonic saline for a few more days to see if it promotes more  wakefulness.  Long discussion with PCCM MD and PA.   In case patient does not show significant meaningful improvement over the weekend may need to have further discussion about goals of care.  Wife is quite realistic and feels patient would not want tracheostomy, PEG tube and prolonged nursing home care which may be unavoidable..   Continue ventilator support for respiratory failure as per critical care team.  Discussed with Dr.Rutvik critical care medicine.  .   This patient is critically ill and at significant risk of neurological worsening, death and care requires constant monitoring of vital signs, hemodynamics,respiratory and cardiac monitoring, extensive review of multiple databases, frequent neurological assessment, discussion with family, other specialists and medical decision making of high complexity.I have made any additions or clarifications directly to the above note.This critical care time does not reflect procedure time, or teaching time or supervisory time of PA/NP/Med Resident etc but could involve care discussion time.  I spent 30 minutes of neurocritical care time  in the care of  this patient.           Eather Popp, MD

## 2023-10-27 NOTE — Progress Notes (Signed)
  Subjective:  Patient ID: Jonathon [name], male    DOB: 1950/10/22,  MRN: [id number]  No chief complaint on file.   DOS: 10/23/23 Procedure: 1. Amputation right 2nd toe at MPJ level   73 y.o. male seen for post op check. Intubated.  Review of Systems: Negative except as noted in the HPI.    Objective:   Constitutional Well developed. Well nourished.  Vascular Foot warm and well perfused. Capillary refill normal to all digits.     Neurologic  Epicritic sensation unable to assess  Dermatologic Amputation site healing as expected no dehiscence no erythema no drainage. Mild eschar.   Orthopedic: S/p R 2nd toe amputation at MPJ level   Radiographs: Expected postsurgical changes related to recent amputation of the second digit right foot  Pathology: pending  Micro:   RARE STAPHYLOCOCCUS HAEMOLYTICUS RARE ALCALIGENES FAECALIS    Assessment:   1. Hemorrhagic stroke [facility])     Plan:  Patient was evaluated and treated and all questions answered.  POD # 4 s/p Right 2nd toe amputation at MPJ level -Progressing as expected in regards to normal healing r 2nd toe amputation. Unfortunately does not seem to be progressing in regards to neurological recovery per chart review.  -XR: expected post op changes -WB Status: currently bedbound -Sutures: remain intact. -Medications/ABX: per primary -Dressing: changed today. Leave intact 1 week - F/u Plan: Will sign off, if patient remains admitted next week Tuesday, I will see then and change dressing.         Marolyn JULIANNA Honour, DPM [facility]

## 2023-10-27 NOTE — Progress Notes (Signed)
 Transported patient to MRI while patient was on the ventilator. Patient remained stable during transport.

## 2023-10-27 NOTE — Progress Notes (Signed)
 eLink Physician-Brief Progress Note [name] DOB: [birth year] MRN: [id number]   Date of Service  10/27/2023  HPI/Events of Note  Reports of patient having thrush on his tongue, difficult to assess remotely  eICU Interventions  Ongoing mouth care, bedside evaluation during dayshift  Nystatin 4 times daily for now     Intervention Category Minor Interventions: Routine modifications to care plan (e.g. PRN medications for pain, fever)  Emilynn Srinivasan 10/27/2023, 9:43 PM

## 2023-10-27 NOTE — Progress Notes (Signed)
 LUE venous exam completed. Positive results given to nurse Karleen. Fabion Gatson, RVT

## 2023-10-27 DEATH — deceased

## 2023-10-28 DIAGNOSIS — R29721 NIHSS score 21: Secondary | ICD-10-CM | POA: Diagnosis not present

## 2023-10-28 DIAGNOSIS — I619 Nontraumatic intracerebral hemorrhage, unspecified: Secondary | ICD-10-CM | POA: Diagnosis not present

## 2023-10-28 DIAGNOSIS — I609 Nontraumatic subarachnoid hemorrhage, unspecified: Secondary | ICD-10-CM | POA: Diagnosis not present

## 2023-10-28 DIAGNOSIS — I4891 Unspecified atrial fibrillation: Secondary | ICD-10-CM | POA: Diagnosis not present

## 2023-10-28 DIAGNOSIS — E785 Hyperlipidemia, unspecified: Secondary | ICD-10-CM

## 2023-10-28 LAB — CBC
HCT: 36.3 % — ABNORMAL LOW (ref 39.0–52.0)
Hemoglobin: 11.8 g/dL — ABNORMAL LOW (ref 13.0–17.0)
MCH: 31 pg (ref 26.0–34.0)
MCHC: 32.5 g/dL (ref 30.0–36.0)
MCV: 95.3 fL (ref 80.0–100.0)
Platelets: 101 10*3/uL — ABNORMAL LOW (ref 150–400)
RBC: 3.81 MIL/uL — ABNORMAL LOW (ref 4.22–5.81)
RDW: 17.2 % — ABNORMAL HIGH (ref 11.5–15.5)
WBC: 4.8 10*3/uL (ref 4.0–10.5)
nRBC: 0 % (ref 0.0–0.2)

## 2023-10-28 LAB — BASIC METABOLIC PANEL WITH GFR
Anion gap: 7 (ref 5–15)
BUN: 38 mg/dL — ABNORMAL HIGH (ref 8–23)
CO2: 23 mmol/L (ref 22–32)
Calcium: 9.2 mg/dL (ref 8.9–10.3)
Chloride: 123 mmol/L — ABNORMAL HIGH (ref 98–111)
Creatinine, Ser: 1.38 mg/dL — ABNORMAL HIGH (ref 0.61–1.24)
GFR, Estimated: 54 mL/min — ABNORMAL LOW (ref >60)
Glucose, Bld: 191 mg/dL — ABNORMAL HIGH (ref 70–99)
Potassium: 4.2 mmol/L (ref 3.5–5.1)
Sodium: 153 mmol/L — ABNORMAL HIGH (ref 135–145)

## 2023-10-28 LAB — AEROBIC/ANAEROBIC CULTURE W GRAM STAIN (SURGICAL/DEEP WOUND)

## 2023-10-28 LAB — SODIUM
Sodium: 151 mmol/L — ABNORMAL HIGH (ref 135–145)
Sodium: 157 mmol/L — ABNORMAL HIGH (ref 135–145)

## 2023-10-28 LAB — GLUCOSE, CAPILLARY
Glucose-Capillary: 158 mg/dL — ABNORMAL HIGH (ref 70–99)
Glucose-Capillary: 172 mg/dL — ABNORMAL HIGH (ref 70–99)
Glucose-Capillary: 155 mg/dL — ABNORMAL HIGH (ref 70–99)
Glucose-Capillary: 147 mg/dL — ABNORMAL HIGH (ref 70–99)
Glucose-Capillary: 122 mg/dL — ABNORMAL HIGH (ref 70–99)
Glucose-Capillary: 141 mg/dL — ABNORMAL HIGH (ref 70–99)

## 2023-10-28 LAB — MAGNESIUM: Magnesium: 2 mg/dL (ref 1.7–2.4)

## 2023-10-28 NOTE — Progress Notes (Signed)
 NAME:  Jonathon [name], MRN:  [id number], DOB:  [birth year], LOS: 13 ADMISSION DATE:  10/15/2023, CONSULTATION DATE: 10/15/2023 REFERRING MD: EDP, CHIEF COMPLAINT: Intracerebral hemorrhage  History of Present Illness:  73 year old man who was found down by wife poorly responsive required intubation and transportation to the emergency.  CT scan revealed a right intraparenchymal hemorrhage with other areas of injury.  He is currently intubated in atrial fibrillation ventricular response on amiodarone  drip being treated with fentanyl  and propofol  for tube tolerance.  He has been seen by neurosurgery and neurology.  Hyperatremia treatment we await the final results of CT scan of the head to see if there is any surgical options.  Pertinent Medical History:   Past Medical History:  Diagnosis Date   Anemia due to blood loss, acute 2014   2nd GIB   Anxiety    CKD (chronic kidney disease), stage III (HCC) 2014   Diabetes mellitus without complication (HCC)    Gout    High cholesterol    Hypertension 2014   Hypothyroid    Neuropathy    PUD (peptic ulcer disease) 2014    Significant Hospital Events: Including procedures, antibiotic start and stop dates in addition to other pertinent events   6/19 Admitted w/ ICH felt 2/2 HTN and c/b SDH p/ fall. Started AEDs. HTNS. Seen by n surg felt unlikely that surg would improve outcome. Seen by neuro. Placed on clevi. Got AC reversed in ER. Unasyn  started for asp 6/20 weaning CT imaging stable ECHO ordered. 1/2 BC + felt contamination but does bring endocarditis into question. Extubated with increased agitation post-extubation, placed on precedex . Patient does drink alcohol every night, placed on CIWA 6/21 Echo with EF 50-55%, low normal LV function +global hypokinesis, degenerative MV without MR, no evidence of valvular vegetations. 6/23: reintubated after poor responsiveness, gurgling and desaturations 6/24: MRI today for R foot osteo r/o  6/25: MRI  yesterday with osteo - adding linezolid . Will consult podiatry. Bronch completed. Waking up.  6/26: SBT with apnea. ABI today for poss amputation R toe 6/27. Amio  6/28 SBT with apnea 6/29: no change, started on modafinil.  6/30: brain stem reflexes present. Put on sbt, will let him ride.  7/1: no changes in alertness 7/2 more awake, but became apneic on sbt.   Interim History / Subjective:   No overnight events Appears to be slightly more responsive although not able to follow commands, does respond to painful stimulation, some flickering of his eyelids with loud noise/name-calling  Objective:   Blood pressure (!) 146/88, pulse 88, temperature 99.1 F (37.3 C), resp. rate 17, height 6' (1.829 m), weight 102.1 kg, SpO2 100%.    Vent Mode: PRVC FiO2 (%):  [30 %-40 %] 40 % Set Rate:  [16 bmp] 16 bmp Vt Set:  [620 mL] 620 mL PEEP:  [5 cmH20] 5 cmH20 Plateau Pressure:  [13 cmH20-16 cmH20] 16 cmH20   Intake/Output Summary (Last 24 hours) at 10/28/2023 1512 Last data filed at 10/28/2023 1200 Gross per 24 hour  Intake 3657.32 ml  Output 2140 ml  Net 1517.32 ml   Filed Weights   10/25/23 0500 10/27/23 0500 10/28/23 0500  Weight: 100.7 kg 101 kg 102.1 kg   Physical Examination: General: non responsive off sedation. HEENT: Moist oral mucosa, endotracheal tube in place Neuro: Not following commands, cough +, weak gag, corneal +,  CV: S1-S2 appreciated, irregular PULM: Clear breath sounds soft HP:dnqu  Extremities: Right foot and postsurgical dressings  I reviewed last 24  h vitals and pain scores, last 48 h intake and output, last 24 h labs and trends, and last 24 h imaging results.  Resolved problem List:   Assessment and Plan:   Right intraparenchymal hemorrhage with brain compression Scattered subarachnoid hemorrhages - On antiepileptic drugs - Continue supportive therapies - Stroke team continues to follow - EEG performed 6/28-no evidence of seizures - MRI brain with  persistent edema - restarted on HTS - Has been off sedative medications - send b12, ammonia, tsh - modafinil 100 daily, now switched to ritalin - unfortunately has not improved from a standpoint of mentation, multiple discussions done, family reports patient wouldn't want prolonged life support.    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 - As needed hydralazine , labetalol  - Scheduled metoprolol  25 bid  Chronic kidney disease - Follow BMP - Avoid nephrotoxic medications  EtOH use history - Continue thiamine , folate  Diabetes - Continue SSI  Is DNR status    Best Practice (right click and Reselect all SmartList Selections daily)   Diet/type: tubefeeds DVT prophylaxis: SCD, heparin  Pressure ulcer(s): present on admission  GI prophylaxis: PPI Lines: N/A Foley:  Yes, and it is still needed Code Status:  DNR Last date of multidisciplinary goals of care discussion: [Bedside 6/28 with spouse at bedside ]  The patient is critically ill with multiple organ systems failure and requires high complexity decision making for assessment and support, frequent evaluation and titration of therapies, application of advanced monitoring technologies and extensive interpretation of multiple databases. Critical Care Time devoted to patient care services described in this note independent of APP/resident time (if applicable)  is 35 minutes.   Kevin Legions MD [facility] CCM On-call: #[phone]

## 2023-10-28 NOTE — Progress Notes (Signed)
 NAME:  Jonathon [name], MRN:  [id number], DOB:  Dec 28, 1950, LOS: 13 ADMISSION DATE:  10/15/2023, CONSULTATION DATE: 10/15/2023 REFERRING MD: EDP, CHIEF COMPLAINT: Intracerebral hemorrhage  History of Present Illness:  73 year old man who was found down by wife poorly responsive required intubation and transportation to the emergency.  CT scan revealed a right intraparenchymal hemorrhage with other areas of injury.  He is currently intubated in atrial fibrillation ventricular response on amiodarone  drip being treated with fentanyl  and propofol  for tube tolerance.  He has been seen by neurosurgery and neurology.  Hyperatremia treatment we await the final results of CT scan of the head to see if there is any surgical options.  Pertinent Medical History:   Past Medical History:  Diagnosis Date   Anemia due to blood loss, acute 2014   2nd GIB   Anxiety    CKD (chronic kidney disease), stage III (HCC) 2014   Diabetes mellitus without complication (HCC)    Gout    High cholesterol    Hypertension 2014   Hypothyroid    Neuropathy    PUD (peptic ulcer disease) 2014    Significant Hospital Events: Including procedures, antibiotic start and stop dates in addition to other pertinent events   6/19 Admitted w/ ICH felt 2/2 HTN and c/b SDH p/ fall. Started AEDs. HTNS. Seen by n surg felt unlikely that surg would improve outcome. Seen by neuro. Placed on clevi. Got AC reversed in ER. Unasyn  started for asp 6/20 weaning CT imaging stable ECHO ordered. 1/2 BC + felt contamination but does bring endocarditis into question. Extubated with increased agitation post-extubation, placed on precedex . Patient does drink alcohol every night, placed on CIWA 6/21 Echo with EF 50-55%, low normal LV function +global hypokinesis, degenerative MV without MR, no evidence of valvular vegetations. 6/23: reintubated after poor responsiveness, gurgling and desaturations 6/24: MRI today for R foot osteo r/o  6/25: MRI  yesterday with osteo - adding linezolid . Will consult podiatry. Bronch completed. Waking up.  6/26: SBT with apnea. ABI today for poss amputation R toe 6/27. Amio  6/28 SBT with apnea 6/29: no change, started on modafinil.  6/30: brain stem reflexes present. Put on sbt, will let him ride.  7/1: no changes in alertness  Interim History / Subjective:   No overnight events Appears to be slightly more responsive although not able to follow commands, does respond to painful stimulation, some flickering of his eyelids with loud noise/name-calling  Objective:   Blood pressure 134/82, pulse 83, temperature 99 F (37.2 C), resp. rate 16, height 6' (1.829 m), weight 102.1 kg, SpO2 100%.    Vent Mode: PRVC FiO2 (%):  [30 %-40 %] 40 % Set Rate:  [16 bmp] 16 bmp Vt Set:  [620 mL] 620 mL PEEP:  [5 cmH20] 5 cmH20 Pressure Support:  [10 cmH20] 10 cmH20 Plateau Pressure:  [12 cmH20-16 cmH20] 16 cmH20   Intake/Output Summary (Last 24 hours) at 10/28/2023 0703 Last data filed at 10/28/2023 0700 Gross per 24 hour  Intake 4023.44 ml  Output 2490 ml  Net 1533.44 ml   Filed Weights   10/25/23 0500 10/27/23 0500 10/28/23 0500  Weight: 100.7 kg 101 kg 102.1 kg   Physical Examination: General: non responsive off sedation. HEENT: Moist oral mucosa, endotracheal tube in place Neuro: Not following commands, cough +, weak gag, corneal +,  CV: S1-S2 appreciated, irregular PULM: Clear breath sounds soft HP:dnqu  Extremities: Right foot and postsurgical dressings  I reviewed last 24 h vitals and  pain scores, last 48 h intake and output, last 24 h labs and trends, and last 24 h imaging results.  Resolved problem List:   Assessment and Plan:   Right intraparenchymal hemorrhage with brain compression Scattered subarachnoid hemorrhages - On antiepileptic drugs - Continue supportive therapies - Stroke team continues to follow - EEG performed 6/28-no evidence of seizures - MRI brain with persistent  edema - restarted on HTS - Has been off sedative medications - send b12, ammonia, tsh - modafinil 100 daily, now switched to ritalin   Acute hypoxemic respiratory failure Aspiration pneumonia - Reintubated 6/23 in the context of not been able to protect his airway  - Continue full vent support - Has been failing SBT with apneic episodes, trial everyday  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 - As needed hydralazine , labetalol  - Scheduled metoprolol  25 bid  Chronic kidney disease - Follow BMP - Avoid nephrotoxic medications  EtOH use history - Continue thiamine , folate  Diabetes - Continue SSI  Is DNR status    Best Practice (right click and Reselect all SmartList Selections daily)   Diet/type: tubefeeds DVT prophylaxis: SCD, heparin  Pressure ulcer(s): present on admission  GI prophylaxis: PPI Lines: N/A Foley:  Yes, and it is still needed Code Status:  DNR Last date of multidisciplinary goals of care discussion: [Bedside 6/28 with spouse at bedside ]  The patient is critically ill with multiple organ systems failure and requires high complexity decision making for assessment and support, frequent evaluation and titration of therapies, application of advanced monitoring technologies and extensive interpretation of multiple databases. Critical Care Time devoted to patient care services described in this note independent of APP/resident time (if applicable)  is 35 minutes.   Kevin Legions MD [facility] CCM On-call: [phone]

## 2023-10-28 NOTE — Progress Notes (Signed)
 STROKE TEAM PROGRESS NOTE    SIGNIFICANT HOSPITAL EVENTS  6/19: level 2 trauma after being found on the ground incontinent and shaking.  HR 200s with wide complex QRS, amio bolus given and drip started.  GCS 6. Intubated on arrival.  CT Head shows an acute intraparenchymal hematoma within the right lateral temporal lobe with surrounding vasogenic edema and 5 mm midline shift and scattered subarachnoid hemorrhages.  PTA xarelto  reversed with [facility] on Admission 22. 6/20: Repeat CT head early this a.m. stable. Extubated in afternoon. 6/21: MRI stable IPH, multiple acute/subacute cortical infarcts seen. 3% stopped.  6/23- re intubated 6/25-found to have osteomyelitis of right second toe, amputation planned for Friday, bronchoscopy performed and cortrak inserted  INTERIM HISTORY/SUBJECTIVE Patient is seen in his room with his wife and daughter at the bedside.  Patient remains intubated.   Patient eyes are partially open.  He is on hypertonic saline serum sodium is 151.  He is not following commands or moving extremities purposefully.  Wife and daughter leaning towards withdrawal of care as they feel patient would not want to live the life of significant disability which seems unavoidable..  Patient has been started on hypertonic saline drip and serum sodium is at goal at 153. OBJECTIVE  CBC    Component Value Date/Time   WBC 4.8 10/28/2023 1028   RBC 3.81 (L) 10/28/2023 1028   HGB 11.8 (L) 10/28/2023 1028   HGB 17.4 08/12/2023 1622   HCT 36.3 (L) 10/28/2023 1028   HCT 53.7 (H) 08/12/2023 1622   PLT 101 (L) 10/28/2023 1028   PLT 127 (L) 08/12/2023 1622   MCV 95.3 10/28/2023 1028   MCV 93 08/12/2023 1622   MCH 31.0 10/28/2023 1028   MCHC 32.5 10/28/2023 1028   RDW 17.2 (H) 10/28/2023 1028   RDW 13.1 08/12/2023 1622   LYMPHSABS 0.9 08/31/2023 1632   LYMPHSABS 0.8 08/04/2022 1459   MONOABS 0.3 08/31/2023 1632   EOSABS 0.0 08/31/2023 1632   EOSABS 0.1 08/04/2022 1459   BASOSABS  0.0 08/31/2023 1632   BASOSABS 0.0 08/04/2022 1459    BMET    Component Value Date/Time   NA 153 (H) 10/28/2023 1028   NA 138 08/31/2023 1632   K 4.2 10/28/2023 1028   CL 123 (H) 10/28/2023 1028   CO2 23 10/28/2023 1028   GLUCOSE 191 (H) 10/28/2023 1028   BUN 38 (H) 10/28/2023 1028   BUN 15 08/31/2023 1632   CREATININE 1.38 (H) 10/28/2023 1028   CALCIUM  9.2 10/28/2023 1028   EGFR 51 (L) 08/31/2023 1632   GFRNONAA 54 (L) 10/28/2023 1028    IMAGING past 24 hours No results found.    Vitals:   10/28/23 1000 10/28/23 1100 10/28/23 1114 10/28/23 1200  BP: (!) 151/88 (!) 151/87 (!) 151/87 (!) 149/98  Pulse: 87 88 95 95  Resp: 18 17 17 18   Temp: 98.8 F (37.1 C) 99.1 F (37.3 C) 99.1 F (37.3 C) 99.3 F (37.4 C)  TempSrc:      SpO2: 100% 95% 100% 99%  Weight:      Height:        PHYSICAL EXAM General: Elderly male, acutely ill CV: paroxysmal Afib on monitor Respiratory: Intubated, mechanically ventilated on full support   NEURO: (On no sedation) patient is lying with eyes closed.  He is unresponsive.  Eyes are partially open but does not follow commands.  Pupils equal round and reactive, opens eyes to voice but does not track examiner, blinks to  threat bilaterally, does not follow commands or respond to noxious stimuli  Temp:  [97.3 F (36.3 C)-99.5 F (37.5 C)] 99.3 F (37.4 C) (07/02 1200) Pulse Rate:  [61-101] 95 (07/02 1200) Resp:  [16-23] 18 (07/02 1200) BP: (115-158)/(66-101) 149/98 (07/02 1200) SpO2:  [95 %-100 %] 99 % (07/02 1200) FiO2 (%):  [30 %-40 %] 40 % (07/02 1114) Weight:  [102.1 kg] 102.1 kg (07/02 0500)   Most Recent NIH: 21     ASSESSMENT/PLAN  [name] is a 73 y.o. male with  73 y.o. male with hx of anemia, afib on xarleto, anxiety, CKD stage 3, DM without complication, HLD, HTN, Hypothyroidism, PUD presenting as a level 2 trauma after being found on the ground incontinent and shaking. He was given 5mg  of versed  by EMS due  to combativeness. He is also on doxycycline  for a toe infection. HR 200s with wide complex QRS, amio bolus given and drip started. GCS 6. Intubated on arrival. CT Head shows an acute intraparenchymal hematoma within the right lateral temporal lobe with surrounding vasogenic edema and 5 mm midline shift and scattered subarachnoid hemorrhages. PTA xarelto  reversed with [facility] on Admission 22.  ICH:  right temporoparietal ICH with scattered SAH, etiology: hypertensive in the setting of DOAC CT Head Acute IPH within right lateral temporal lobe with surrounding vasogenic edema and mass effect, midline shift of approximately 5 mm to left. Scattered subarachnoid hemorrhage CTA head & neck Negative for spot sign, intracranial aneurysm, or evidence of vascular malformation. Right sided subdural hematoma (estimated at 4 mm) suspected in addition to intra-axial and subarachnoid blood. Repeat CT head 6/20 early AM: overall stable Right temporoparietal parenchymal hemorrhage with surrounding vasogenic edema and mass effect, not significantly changed.  Midline shift is now 6 mm. Scattered subarachnoid hemorrhage, similar to prior. Status post [facility]  reversal MRI  with and without contrast: Stable right temporoparietal IPH and associated vasogenic edema. Mass effect and midline shift similar to prior study, measuring 6 mm. Multiple punctate acute/subacute bilateral cortical infarcts Repeat MRI brain 10/27/2023 stable appearance of the large right temporal lobar hematoma with cytotoxic edema and 6 mm right-to-left shift 6/23- CT Head- Stable hematoma, with MLS 7mm CT repeat 6/26 right temporal IPH similar in size with vasogenic edema and 6 mm midline shift, subarachnoid hemorrhage less prominent  MRI repeat 11/15/2022 shows stable appearance of the large right temporal lobar hemorrhage with 6 mm leftward midline shift.  No new findings 2D Echo: EF 50 to 55%, global hypokinesis, degenerative mitral valve LDL 54 in  08/2023 HgbA1c 5.6 in 08/2023 VTE prophylaxis - SCDs Xarelto  (rivaroxaban ) daily prior to admission, continue No antithrombotic due to ICH Therapy recommendations:  CIR Disposition: Pending  Cerebral Edema Repeat CT 6/23 overall stable hematoma, and slight increase cerebral edema with MLS 7mm CT repeat 6/26 demonstrates stable IPH NS consulted, no surgical intervention at this time. 3% bolus of 250 given 6/19 3% hypertonic saline stopped 6/21 -> on NS @ 50-> off Na 138-134-136-144-150-148-154-151 -149- 628 566 4045 Allow Na gradually trending down  Seizure like activity Incontinence with shaking at onset per report No seizure since admission LTM EEG no seizure-like activity seen, discontinued 6/20 S/p keppra  load Keppra  7 days course completed  Acute Respiratory Failure ?Aspiration Pneumonia Intubated 6/19 d/t mental status, inability to protect airway Extubated 6/20 afternoon -> 6L [place] -> 15L NRB->re-intubated 6/23 B Cx showed 1/4 steph hemolyticus, concerning for contamination, repeat blood cultures NGTD WBC 4.6->5.7->5.8->5.4 CXR with New right perihilar airspace disease  S/p bronch 6/25, no mucous plugs On unasyn    Atrial fibrillation with RVR Home Meds: Xarelto  20 mg daily Continue telemetry monitoring 6/26 overnight RVR 150s received amio bolus, now off amio IV No anticoagulation now due to ICH  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 N/A TOC consult for cessation placed  Dysphagia Patient has post-stroke dysphagia, SLP consulted Now n.p.o. On TF @ 55 -> off 6/27 for OR  Other Stroke Risk Factors Obesity, Body mass index is 30.53 kg/m., BMI >/= 30 associated with increased stroke risk, recommend weight loss, diet and exercise as appropriate Former smoker  Other Active Problems CKD3b, creatinine 1.50--1.35--1.58--1.50--1.38--1.36--1.58-1.57, on TF  Hospital day # 13 Patient seen and examined by NP/APP with MD. MD to update note as needed.   Patient is 13 days out from his hemorrhage and repeat imaging shows expected evolutionary changes and persistent 6 mm right-to-left shift but patient neurological exam and mental status seem out of proportion without obvious explanation.  Patient had received phenobarbital  alcohol withdrawal protocol and it has been 7 days since last dose.  He is also afebrile with no signs of infection.  EEG also shows no seizure activity.  Continue repeat trial of hypertonic saline for a few more days to see if it promotes more wakefulness.  Long discussion with PCCM MD and PA.   In case patient does not show significant meaningful improvement in the next few days family is willing to consider comfort care transition..  Wife is quite realistic and feels patient would not want tracheostomy, PEG tube and prolonged nursing home care which may be unavoidable..   Continue ventilator support for respiratory failure as per critical care team.  Discussed with Dr.Rutvik critical care medicine.  .  This patient is critically ill and at significant risk of neurological worsening, death and care requires constant monitoring of vital signs, hemodynamics,respiratory and cardiac monitoring, extensive review of multiple databases, frequent neurological assessment, discussion with family, other specialists and medical decision making of high complexity.I have made any additions or clarifications  directly to the above note.This critical care time does not reflect procedure time, or teaching time or supervisory time of PA/NP/Med Resident etc but could involve care discussion time.  I spent 30 minutes of neurocritical care time  in the care of  this patient.              Eather Popp, MD

## 2023-10-28 NOTE — TOC Progression Note (Signed)
 Transition of Care [facility]) - Progression Note    Patient Details  Name: Jonathon [name] MRN: [id number] Date of Birth: 12/25/1950  [facility] CM/SW Contact  Inocente GORMAN Kindle, LCSW Phone Number: 10/28/2023, 8:53 AM  Clinical Narrative:    CSW continuing to follow. Patient remains intubated.    Expected Discharge Plan: IP Rehab Facility Barriers to Discharge: Continued Medical Work up  Expected Discharge Plan and Services In-house Referral: Clinical Social Work     Living arrangements for the past 2 months: Single Family Home                                       Social Determinants of Health (SDOH) Interventions SDOH Screenings   Food Insecurity: No Food Insecurity (05/30/2022)  Housing: Low Risk  (05/30/2022)  Transportation Needs: No Transportation Needs (05/30/2022)  Utilities: Not At Risk (05/30/2022)  Alcohol Screen: Low Risk  (05/30/2022)  Depression (PHQ2-9): Low Risk  (10/01/2023)  Financial Resource Strain: Low Risk  (05/30/2022)  Physical Activity: Inactive (05/30/2022)  Social Connections: Moderately Isolated (05/30/2022)  Stress: No Stress Concern Present (05/30/2022)  Tobacco Use: Medium Risk (10/21/2023)    Readmission Risk Interventions     No data to display

## 2023-10-29 DIAGNOSIS — I619 Nontraumatic intracerebral hemorrhage, unspecified: Secondary | ICD-10-CM | POA: Diagnosis not present

## 2023-10-29 DIAGNOSIS — R29721 NIHSS score 21: Secondary | ICD-10-CM | POA: Diagnosis not present

## 2023-10-29 DIAGNOSIS — I4891 Unspecified atrial fibrillation: Secondary | ICD-10-CM | POA: Diagnosis not present

## 2023-10-29 DIAGNOSIS — I609 Nontraumatic subarachnoid hemorrhage, unspecified: Secondary | ICD-10-CM | POA: Diagnosis not present

## 2023-10-29 LAB — CULTURE, RESPIRATORY W GRAM STAIN

## 2023-10-29 LAB — GLUCOSE, CAPILLARY
Glucose-Capillary: 137 mg/dL — ABNORMAL HIGH (ref 70–99)
Glucose-Capillary: 124 mg/dL — ABNORMAL HIGH (ref 70–99)
Glucose-Capillary: 125 mg/dL — ABNORMAL HIGH (ref 70–99)

## 2023-10-29 LAB — SODIUM
Sodium: 158 mmol/L — ABNORMAL HIGH (ref 135–145)
Sodium: 158 mmol/L — ABNORMAL HIGH (ref 135–145)

## 2023-10-29 LAB — MAGNESIUM: Magnesium: 1.9 mg/dL (ref 1.7–2.4)

## 2023-10-29 MED ORDER — POLYVINYL ALCOHOL 1.4 % OP SOLN
1.0000 [drp] | Freq: Four times a day (QID) | OPHTHALMIC | Status: DC | PRN
Start: 2023-10-29 — End: 2023-10-29

## 2023-10-29 MED ORDER — ONDANSETRON 4 MG PO TBDP: 4.0000 mg | ORAL_TABLET | Freq: Four times a day (QID) | ORAL | Status: DC | PRN

## 2023-10-29 MED ORDER — ONDANSETRON HCL 4 MG/2ML IJ SOLN: 4.0000 mg | Freq: Four times a day (QID) | INTRAMUSCULAR | Status: DC | PRN

## 2023-10-29 MED ORDER — HYDROMORPHONE HCL-NACL 50-0.9 MG/50ML-% IV SOLN
0.0000 mg/h | INTRAVENOUS | Status: DC
  Filled 2023-10-29: qty 50

## 2023-10-29 MED ORDER — GLYCOPYRROLATE 0.2 MG/ML IJ SOLN
0.2000 mg | INTRAMUSCULAR | Status: DC | PRN
Start: 2023-10-29 — End: 2023-10-29
  Administered 2023-10-29: 0.2 mg via INTRAVENOUS
  Filled 2023-10-29: qty 1

## 2023-10-29 MED ORDER — SODIUM CHLORIDE 0.9 % IV SOLN
0.0000 mg/h | INTRAVENOUS | Status: DC
  Administered 2023-10-29: 1 mg/h via INTRAVENOUS
  Filled 2023-10-29: qty 5

## 2023-10-29 MED ORDER — LORAZEPAM 2 MG/ML IJ SOLN
2.0000 mg | INTRAMUSCULAR | Status: DC | PRN
  Administered 2023-10-29: 2 mg via INTRAVENOUS
  Filled 2023-10-29: qty 1

## 2023-10-29 MED ORDER — HYDROMORPHONE BOLUS VIA INFUSION
1.0000 mg | INTRAVENOUS | Status: DC | PRN
  Administered 2023-10-29 (×3): 1 mg via INTRAVENOUS

## 2023-10-29 MED ORDER — GLYCOPYRROLATE 0.2 MG/ML IJ SOLN: 0.2000 mg | INTRAMUSCULAR | Status: DC | PRN

## 2023-10-29 MED ORDER — GLYCOPYRROLATE 1 MG PO TABS: 1.0000 mg | ORAL_TABLET | ORAL | Status: DC | PRN

## 2023-11-02 LAB — GLUCOSE, CAPILLARY: Glucose-Capillary: 171 mg/dL — ABNORMAL HIGH (ref 70–99)

## 2023-11-10 ENCOUNTER — Ambulatory Visit: Admitting: Hematology

## 2023-11-10 ENCOUNTER — Other Ambulatory Visit

## 2023-11-18 ENCOUNTER — Telehealth: Payer: Self-pay | Admitting: *Deleted

## 2023-11-18 NOTE — Telephone Encounter (Signed)
 Copied from CRM #[id number]. Topic: Medical Record Request - Records Request >> Nov 18, 2023 11:52 AM Diannia H wrote: Reason for CRM: Patients wife is calling to see if the patients medical records were sent out for [employer], its a third party and she is not sure the name of the company, could you assist? Callback number is [phone]. She is also wanted some information about her daughters FMLA paperwork being completed, she has some additional questions about them. If possible could you call back as soon as possible.

## 2023-11-18 NOTE — Telephone Encounter (Signed)
 Pt's wife  stated she filed a claim with [employer]; want to know if we received a fax requesting pt's medical records.. Informed Hme is off today; and I did not see it in the chart (under media tab). And as far as daughter's FMLA, I informed her to get in contact with the admitting doctor's office.; telephone # to [facility] given to pt.

## 2023-11-27 NOTE — Death Summary Note (Signed)
 DEATH SUMMARY   Patient Details  Name: Jonathon [name] MRN: [id number] DOB: 11-Sep-1950  Admission/Discharge Information   Admit Date:  11-10-2023  Date of Death: Date of Death: Nov 24, 2023  Time of Death: Time of Death: 11-27-1528  Length of Stay: Dec 05, 2023  Referring Physician: Tobie Gaines, DO   Reason(s) for Hospitalization  Acute hemorrhagic stroke  Diagnoses  Preliminary cause of death:  Hemorrhagic stroke Secondary Diagnoses (including complications and co-morbidities):  Principal Problem:   Hemorrhagic stroke [facility]) Active Problems:   Subdural hematoma (HCC)   Malnutrition of moderate degree   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 year old male who  73 year old man who was found down by wife poorly responsive required intubation and transportation to the emergency. CT scan revealed a right intraparenchymal hemorrhage with other areas of injury. He is currently intubated in atrial fibrillation ventricular response on amiodarone  drip being treated with fentanyl  and propofol  for tube tolerance. He has been seen by neurosurgery and neurology.  Patient had intracranial hemorrhage with subarachnoid and edema. He was started on 3 % . His mentation continued to be poor and multiple goals of care discussion were held. He had osteo  of his right foot and this was removed.  Inspite of multiple days of supportive care mentation did not improve. Patients family expressed that he would not want to live long term on life support and requested withdrawal of aggressive care.  Patient was made comfort care. He was extubated compassionately.  Pertinent Labs and Studies  Significant Diagnostic Studies VAS US  UPPER EXTREMITY VENOUS DUPLEX Result Date: 10/27/2023 UPPER VENOUS STUDY  Patient Name:  Jonathon DELAUGHTER   Date of Exam:   10/27/2023 Medical Rec #: [id number]          Accession #:    [id number] Date of Birth: [birth year]         Patient Gender: M Patient Age:   73 years Exam Location:  [facility] Procedure:      VAS US  UPPER EXTREMITY VENOUS DUPLEX Referring Phys: KEVIN LEGIONS --------------------------------------------------------------------------------  Indications: DVT Performing Technologist: Elmarie Lindau, RVT  Examination Guidelines: A complete evaluation includes B-mode imaging, spectral Doppler, color Doppler, and power Doppler as needed of all accessible portions of each vessel. Bilateral testing is considered an integral part of a complete examination. Limited examinations for reoccurring indications may be performed as noted.  Right Findings: +----------+------------+---------+-----------+----------+-------+ RIGHT     CompressiblePhasicitySpontaneousPropertiesSummary +----------+------------+---------+-----------+----------+-------+ Subclavian    Full                                          +----------+------------+---------+-----------+----------+-------+  Left Findings: +----------+------------+---------+-----------+----------+--------------+ LEFT      CompressiblePhasicitySpontaneousProperties   Summary     +----------+------------+---------+-----------+----------+--------------+ IJV           Full                                                 +----------+------------+---------+-----------+----------+--------------+ Subclavian    Full                                                 +----------+------------+---------+-----------+----------+--------------+  Axillary      Full                                                 +----------+------------+---------+-----------+----------+--------------+ Brachial      None                                      Acute      +----------+------------+---------+-----------+----------+--------------+ Radial         Full                                                 +----------+------------+---------+-----------+----------+--------------+ Ulnar                                               Not visualized +----------+------------+---------+-----------+----------+--------------+ Cephalic      Full                                      Acute      +----------+------------+---------+-----------+----------+--------------+ Basilic       Full                                                 +----------+------------+---------+-----------+----------+--------------+ The mid upper arm area is not seen due to IV/bandage. There is acute appearing, noncompressible thrombus in 1 of 2 of the distal brachial veins, a perforator vein, and cephalic vein.  Summary:  Right: No evidence of thrombosis in the subclavian.  Left: Findings consistent with acute deep vein thrombosis involving the left brachial veins. Findings consistent with acute superficial vein thrombosis involving the left cephalic vein.  *See table(s) above for measurements and observations.  Diagnosing physician: Debby Robertson Electronically signed by Debby Robertson on 10/27/2023 at 9:15:21 PM.    Final    DG Chest 1 View Result Date: 10/27/2023 CLINICAL DATA:  Aspiration EXAM: CHEST  1 VIEW COMPARISON:  10/24/2023 FINDINGS: Endotracheal tube terminates 4.3 cm above carina. Feeding tube extends beyond the inferior aspect of the film. A left-sided PICC line tip at low SVC. Numerous leads and wires project over the chest. Midline trachea. Normal heart size. No pleural effusion or pneumothorax. Left base airspace disease is mildly increased. IMPRESSION: Mild increase in left base airspace disease, most likely atelectasis. Developing infection or aspiration could look similar. Electronically Signed   By: Rockey Kilts M.D.   On: 10/27/2023 11:51   MR BRAIN WO CONTRAST Result Date: 10/27/2023 CLINICAL DATA:  73 year old male with large right temporal lobe  hemorrhage presenting on 10/15/2023. Subsequent encounter. EXAM: MRI HEAD WITHOUT CONTRAST TECHNIQUE: Multiplanar, multiecho pulse sequences of the brain and surrounding structures were obtained without intravenous contrast. COMPARISON:  Head CT 10/22/2023, brain MRI 10/17/2023 and earlier. FINDINGS: Brain: Residual blood products in the right temporal lobe with susceptibility and diffusion restriction. Configuration stable from  the CT on 10/22/2023, encompassing about 83 by 37 x 40 mm (AP by transverse by CC), estimated 61 mL volume. Regional T2 and FLAIR hyperintense edema with mass effect on the right lateral ventricle which is mildly progressed from the MRI 10/17/2023, stable from the recent CT. Leftward midline shift measures 6 mm on coronal image 20, stable from the recent CT. Basilar cisterns remain patent. Small volume subarachnoid blood and trace right side peripheral subdural hematoma (series 11, images 32 and 42). Susceptibility on DWI associated with some of that small volume extra-axial blood. Trace intraventricular hemorrhage in both occipital horns. No superimposed restricted diffusion to suggest acute infarction. No ventriculomegaly. Cervicomedullary junction and pituitary are within normal limits. Maintained gray and white matter signal elsewhere in the brain. And no occult chronic cerebral blood products on SWI. Vascular: Major intracranial vascular flow voids are stable. Skull and upper cervical spine: Stable and normal for age visible cervical spine. Visualized bone marrow signal is within normal limits. Sinuses/Orbits: Stable orbits. Intubated. Stable paranasal sinus and right mastoid opacification. Left mastoid effusion has mildly increased. Other: Expected fluid in the pharynx. Negative visible scalp and face. IMPRESSION: 1. Residual large right temporal lobe hemorrhage with mass effect and 6 mm of leftward midline shift stable from the CT on 10/22/2023. Associated small volume subarachnoid  hemorrhage, trace right side subdural hematoma, and trace IVH. 2. No new intracranial abnormality. Electronically Signed   By: VEAR Hurst M.D.   On: 10/27/2023 04:03   DG Chest Port 1 View Result Date: 10/24/2023 CLINICAL DATA:  Status post central line placement EXAM: PORTABLE CHEST 1 VIEW COMPARISON:  10/21/2023 FINDINGS: Stable cardiomediastinal silhouette. Aortic atherosclerotic calcification. Similar retrocardiac atelectasis. Otherwise no focal consolidation, pleural effusion, or pneumothorax. No displaced rib fractures. Stable position of the endotracheal tube with tip 5.5 cm from the carina. Subdiaphragmatic enteric tube. Left PICC tip in the mid SVC. IMPRESSION: Left PICC tip in the mid SVC.  No pneumothorax. Electronically Signed   By: Norman Gatlin M.D.   On: 10/24/2023 17:13   EEG adult Result Date: 10/24/2023 Shelton Arlin KIDD, MD     10/24/2023  4:45 PM Patient Name: Wendle Kina MRN: [id number] Epilepsy Attending: Arlin KIDD Shelton Referring Physician/Provider: Remi Pippin, NP Date: 10/24/2023 Duration: 22.43 mins Patient history: 73yo M with right temporal ICH, noted to have seizure like activity. EEG to evaluate for seizure Level of alertness: comatose/ lethargic AEDs during EEG study: PGB Technical aspects: This EEG study was done with scalp electrodes positioned according to the 10-20 International system of electrode placement. Electrical activity was reviewed with band pass filter of 1-70Hz , sensitivity of 7 uV/mm, display speed of 49mm/sec with a 60Hz  notched filter applied as appropriate. EEG data were recorded continuously and digitally stored.  Video monitoring was available and reviewed as appropriate. Description: EEG showed continuous generalized and maximal right temporal 3 to 6 Hz theta-delta slowing. Hyperventilation and photic stimulation were not performed.   ABNORMALITY - Continuous slow, generalized and maximal right temporal IMPRESSION: This study is suggestive of cortical  dysfunction arising from right temporal likely secondary to underlying structural abnormality/ ICH. Additionally there was moderate diffuse encephalopathy. No seizures or epileptiform discharges were seen throughout the recording. Priyanka O Yadav   US  EKG SITE RITE Result Date: 10/24/2023 If [facility] image not attached, placement could not be confirmed due to current cardiac rhythm.  US  EKG SITE RITE Result Date: 10/24/2023 If [facility] image not attached, placement could not be confirmed due to  current cardiac rhythm.  DG Foot 2 Views Right Result Date: 10/23/2023 CLINICAL DATA:  [id number] Post-operative state [id number] EXAM: RIGHT FOOT - 2 VIEW COMPARISON:  October 15, 2023 FINDINGS: Redemonstrated amputation of the great toe. Interval amputation of the second digit.No acute fracture or dislocation. There is no evidence of arthropathy or other focal bone abnormality. Peripheral vascular atherosclerosis. No subcutaneous gas. IMPRESSION: Expected postsurgical changes related to recent amputation of the second digit. No radiopaque foreign body or. No subcutaneous gas or bony changes to suggest osteomyelitis. Electronically Signed   By: Rogelia Myers M.D.   On: 10/23/2023 14:17   VAS US  ABI WITH/WO TBI Result Date: 10/22/2023  LOWER EXTREMITY DOPPLER STUDY Patient Name:  FERGUS THRONE  Date of Exam:   10/22/2023 Medical Rec #: [id number]          Accession #:    [id number] Date of Birth: June 17, 1950         Patient Gender: M Patient Age:   72 years Exam Location:  [facility] Procedure:      VAS US  ABI WITH/WO TBI Referring Phys: MARSA HONOUR --------------------------------------------------------------------------------  Indications: Ulceration. High Risk Factors: Hypertension, hyperlipidemia, Diabetes, prior CVA. Other Factors: Normal ABI 06/28/2019.  Performing Technologist: Jimmye Scarce RVT  Examination Guidelines: A complete evaluation includes at minimum, Doppler waveform signals and  systolic blood pressure reading at the level of bilateral brachial, anterior tibial, and posterior tibial arteries, when vessel segments are accessible. Bilateral testing is considered an integral part of a complete examination. Photoelectric Plethysmograph (PPG) waveforms and toe systolic pressure readings are included as required and additional duplex testing as needed. Limited examinations for reoccurring indications may be performed as noted.  ABI Findings: +--------+------------------+-----+---------+---------------+ Right   Rt Pressure (mmHg)IndexWaveform Comment         +--------+------------------+-----+---------+---------------+ Brachial                       triphasicIV              +--------+------------------+-----+---------+---------------+ PTA     208                    triphasic                +--------+------------------+-----+---------+---------------+ DP                             triphasicnoncompressible +--------+------------------+-----+---------+---------------+ +--------+------------------+-----+---------+---------------+ Left    Lt Pressure (mmHg)IndexWaveform Comment         +--------+------------------+-----+---------+---------------+ Brachial                       triphasicIV              +--------+------------------+-----+---------+---------------+ PTA     219                    triphasic                +--------+------------------+-----+---------+---------------+ DP                             triphasicNoncompressible +--------+------------------+-----+---------+---------------+ Arterial wall calcification precludes accurate ankle pressures and ABIs.  Summary: Right: Resting right ankle-brachial index indicates noncompressible right lower extremity arteries. Left: Resting left ankle-brachial index indicates noncompressible left lower extremity arteries. *See table(s) above for measurements and observations.  Electronically signed by Debby Robertson on 10/22/2023 at  9:38:30 PM.    Final    CT HEAD WO CONTRAST ( ) Result Date: 10/22/2023 EXAM: CT HEAD WITHOUT 10/22/2023 05:02:01 AM TECHNIQUE: CT of the head was performed without the administration of intravenous contrast. Automated exposure control, iterative reconstruction, and/or weight based adjustment of the mA/kV was utilized to reduce the radiation dose to as low as reasonably achievable. COMPARISON: CT head without contrast 10/19/2023. CLINICAL HISTORY: Stroke, hemorrhagic. FINDINGS: BRAIN AND VENTRICLES: Right temporal parenchymal hematoma is again noted with expected evolution. Overall size is similar to the prior studies measuring 8.8 x 2.4 x 3.4 cm. Concerning vasogenic edema and mass effect similar to prior study. Midline shift is 6 mm. Subarachnoid hemorrhage over the convexity is less prominent than previously seen. No new hemorrhage is present. ORBITS: Bilateral lens replacements are noted. The globes and orbits are otherwise within normal limits. SINUSES AND MASTOIDS: No acute abnormality. SOFT TISSUES AND SKULL: No acute skull fracture. No acute soft tissue abnormality. IMPRESSION: 1. Right temporal parenchymal hematoma with expected evolution, similar in size to prior studies, associated vasogenic edema, and mass effect with 6 mm midline shift. 2. Subarachnoid hemorrhage over the convexity, less prominent than previously seen. 3. No new hemorrhage. Electronically signed by: Lonni Necessary MD 10/22/2023 05:19 AM EDT RP Workstation: [id number]   DG CHEST PORT 1 VIEW Result Date: 10/21/2023 CLINICAL DATA:  Aspiration. EXAM: PORTABLE CHEST 1 VIEW COMPARISON:  Chest radiograph dated 10/20/2023. FINDINGS: Endotracheal tube approximately 5.5 cm above the carina. Enteric tube extends below the diaphragm with tip beyond the inferior margin of the image. Left lung base/retrocardiac atelectasis or infiltrate. No large pleural effusion. The right lung is clear. No pneumothorax. Stable  cardiac silhouette. Atherosclerotic calcification of the aorta. No acute osseous pathology. IMPRESSION: 1. Endotracheal tube approximately 5.5 cm above the carina. 2. Left lung base/retrocardiac atelectasis or infiltrate. Electronically Signed   By: Vanetta Chou M.D.   On: 10/21/2023 11:37   MR TOES RIGHT W WO CONTRAST Result Date: 10/20/2023 CLINICAL DATA:  Concern for osteomyelitis.  History of diabetes. EXAM: MRI OF THE RIGHT TOES WITHOUT AND WITH CONTRAST TECHNIQUE: Multiplanar, multisequence MR imaging of the right foot was performed both before and after administration of intravenous contrast. CONTRAST:  9mL GADAVIST  GADOBUTROL  1 MMOL/ML IV SOLN COMPARISON:  MRI of the right foot dated June 09, 2019. FINDINGS: Bones/Joint/Cartilage Marrow edema with confluent T1 hypointensity of the second proximal and middle phalanges with enhancement on postcontrast sequences, most compatible with osteomyelitis. Overlying soft tissue ulceration/wound along the dorsal aspect of the mid second digit, at the level of the second PIP joint. No convincing marrow signal abnormality identified elsewhere to suggest osteomyelitis. 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 No acute fracture or dislocation. Second through fifth digit hammertoe deformities. No joint effusion. 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 Ligaments Lisfranc ligament is intact. Muscles and Tendons Atrophy and increased T2 signal of the intrinsic musculature of the foot without significant associated enhancement is nonspecific and favored to reflect chronic denervation changes. Flexor hallucis longus tendinosis. No significant tenosynovitis. Soft tissue Cutaneous ulceration/wound along the dorsal aspect of the mid second digit, at the level of the second PIP joint with surrounding edema and enhancement, concerning for cellulitis. No loculated fluid collection.  Nonspecific subcutaneous edema extending along the dorsal foot. IMPRESSION: 1. Marrow signal abnormality with enhancement of the right second proximal and middle phalanges, most compatible with osteomyelitis. Overlying soft  tissue ulceration/wound along the dorsal aspect of the mid second digit with findings suggestive of cellulitis. No abscess. 2. 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 3. Atrophy and increased T2 signal of the intrinsic musculature of the foot without significant associated enhancement is nonspecific and favored to reflect chronic denervation changes. 4. Flexor hallucis longus tendinosis. Electronically Signed   By: Harrietta Sherry M.D.   On: 10/20/2023 16:56   DG CHEST PORT 1 VIEW Result Date: 10/20/2023 CLINICAL DATA:  Respiratory failure. EXAM: PORTABLE CHEST 1 VIEW COMPARISON:  October 19, 2023. FINDINGS: The heart size and mediastinal contours are within normal limits. Endotracheal and nasogastric tubes are unchanged. Both lungs are clear. The visualized skeletal structures are unremarkable. IMPRESSION: No active disease. Electronically Signed   By: Lynwood Landy Raddle M.D.   On: 10/20/2023 14:24   CT HEAD WO CONTRAST ( ) Result Date: 10/19/2023 CLINICAL DATA:  Stroke, follow up. Follow-up intracranial hemorrhage. EXAM: CT HEAD WITHOUT CONTRAST TECHNIQUE: Contiguous axial images were obtained from the base of the skull through the vertex without intravenous contrast. RADIATION DOSE REDUCTION: This exam was performed according to the departmental dose-optimization program which includes automated exposure control, adjustment of the mA and/or kV according to patient size and/or use of iterative reconstruction technique. COMPARISON:  Head CT 10/16/2023 and MRI 10/17/2023 FINDINGS: Brain: A large parenchymal hematoma in the right temporal lobe has not significantly changed in size, measuring approximately 3.0 x  3.0 x 8.4 cm including the dominant hyperdense component as well as the anteriorly located, non dependent hypodense component. Mild-to-moderate surrounding vasogenic edema and associated mass effect are stable to at most minimally increased from the prior studies with leftward midline shift measuring 7 mm (previously 6 mm). Scattered small volume subarachnoid hemorrhage involving right greater than left cerebral sulci has not significantly progressed. No acute large territory infarct or hydrocephalus is evident. A thin subdural hematoma over the right cerebral convexity is unchanged, measuring up to 4 mm in thickness. There is mild cerebral atrophy. Vascular: No hyperdense vessel. Skull: No acute fracture or suspicious lesion. Sinuses/Orbits: A large cyst in the right maxillary sinus associated with an unerupted molar tooth is again noted. Mild scattered paranasal sinus mucosal thickening is noted elsewhere. There are persistent right larger than left mastoid effusions and right middle ear fluid. Bilateral cataract extraction. Other: None. IMPRESSION: 1. Unchanged large right temporal lobe parenchymal hematoma with stable to minimally increased surrounding edema and mass effect. Leftward midline shift of 7 mm. 2. Unchanged small volume subarachnoid hemorrhage and thin right subdural hematoma. Electronically Signed   By: Dasie Hamburg M.D.   On: 10/19/2023 13:16   Portable Chest x-ray Result Date: 10/19/2023 CLINICAL DATA:  [id number] Endotracheal tube present [id number] EXAM: PORTABLE CHEST - 1 VIEW COMPARISON:  Earlier film of the same day FINDINGS: Endotracheal tube has been placed, tip 4.4 cm above carina. Gastric tube extends at least as far as the stomach, tip not seen. Patchy perihilar and infrahilar opacities stable. No new infiltrate. Heart size and mediastinal contours are within normal limits. Aortic Atherosclerosis (ICD10-170.0). No effusion. Visualized bones unremarkable. IMPRESSION: 1. Endotracheal tube 4.4  cm above carina. 2. Stable perihilar and infrahilar opacities. Electronically Signed   By: JONETTA Faes M.D.   On: 10/19/2023 11:09   DG Chest Port 1 View Result Date: 10/19/2023 CLINICAL DATA:  [id number] Hypoxia [id number] EXAM: PORTABLE CHEST - 1 VIEW COMPARISON:  10/16/2023 FINDINGS: Relatively low lung volumes. Patchy somewhat ill-defined right perihilar airspace opacities, new since previous. Stable left infrahilar atelectasis/scarring. Heart size and mediastinal contours are within normal limits. Nasogastric tube extends at least as far as the stomach, tip not seen. No definite effusion. Visualized bones unremarkable. IMPRESSION: New right perihilar airspace disease. Electronically Signed   By: JONETTA Faes M.D.   On: 10/19/2023 07:58   DG Abd Portable 1V Result Date: 10/18/2023 CLINICAL DATA:  Encounter for feeding tube placement EXAM: PORTABLE ABDOMEN - 1 VIEW COMPARISON:  None Available. FINDINGS: Feeding tube extends the stomach.  Side port below the GE junction. IMPRESSION: NG tube tip in stomach. Electronically Signed   By: Jackquline Boxer M.D.   On: 10/18/2023 16:46   DG Abd Portable 1V Result Date: 10/18/2023 CLINICAL DATA:  Evaluate feeding tube placement. EXAM: PORTABLE ABDOMEN - 1 VIEW COMPARISON:  10/16/2023 FINDINGS: No enteric tube is identified within the imaged portions of the lower chest and abdomen. Mild gaseous distension of the stomach. Nonobstructive bowel gas pattern. IMPRESSION: 1. No enteric tube is identified within the imaged portions of the lower chest and abdomen. 2. Mild gaseous distension of the stomach. Electronically Signed   By: Waddell Calk M.D.   On: 10/18/2023 11:22   MR BRAIN W WO CONTRAST Result Date: 10/17/2023 EXAM: MRI BRAIN WITH AND WITHOUT CONTRAST 10/17/2023 11:32:24 AM TECHNIQUE: Multiplanar multisequence MRI of the head/brain was performed with and without the administration of intravenous contrast. COMPARISON: CT head without contrast 10/16/2023 and CT head  without contrast 10/15/2023. CLINICAL HISTORY: Posttraumatic intraparenchymal hemorrhage. FINDINGS: BRAIN AND VENTRICLES: A right temporoparietal heterogeneous parenchymal hemorrhage and associated vasogenic edema is stable in size. A layering fluid level is present more anteriorly in the temporal lobe. Mass effect and midline shift is similar to the prior study, measuring 6 mm. Partial effacement of the posterior right lateral ventricle is again noted. Partial effacement of the sulci is noted. Multiple foci of extra-axial hemorrhage are evident over the right hemisphere. Diffusion-weighted images demonstrate multiple punctate acute infarctions. An acute/subacute subcortical infarct is present in the anterior right superior frontal gyrus on image 43 of series 2. Cortical infarcts are present in the right postcentral gyrus on image 44, more posteriorly in the right parietal lobe on image 44, and in the medial right parietal lobe on image 43. A cortical subacute infarct is present in the left parietal lobe on images 42 and 41 of series 2. A focal punctate cortical infarct is present in the posterior left frontal lobe on image 44. ORBITS: BILATERAL LENS REPLACEMENTS ARE NOTED. THE GLOBES AND ORBITS ARE OTHERWISE WITHIN NORMAL LIMITS. SINUSES: A dentigerous cyst in the right maxillary sinus is again noted. The paranasal sinuses and mastoid air cells are otherwise clear. BONES AND SOFT TISSUES: Normal bone marrow signal and enhancement. No acute soft tissue abnormality. IMPRESSION: 1. Stable right temporoparietal heterogeneous parenchymal hemorrhage with associated vasogenic edema, mass effect, and midline shift measuring 6 mm. 2. Multiple foci of extra-axial hemorrhage over the right hemisphere. 3. Multiple punctate acute/subacute bilateral cortical infarcts. Electronically signed by: Lonni Necessary MD 10/17/2023 11:50 AM EDT RP Workstation: [id number]   ECHOCARDIOGRAM LIMITED Result Date: 10/17/2023     ECHOCARDIOGRAM LIMITED REPORT   Patient Name:   DEANTE BLOUGH Date of Exam: 10/17/2023 Medical Rec #:  [id number]         Height:       72.0 in Accession #:    [id number]  Weight:       217.4 lb Date of Birth:  1950/08/23        BSA:          2.207 m Patient Age:    72 years          BP:           150/95 mmHg Patient Gender: M                 HR:           90 bpm. Exam Location:  Inpatient Procedure: 2D Echo, Limited Color Doppler and Cardiac Doppler (Both Spectral and            Color Flow Doppler were utilized during procedure). Indications:    Limited for A-fib and endocarditis  History:        Patient has prior history of Echocardiogram examinations, most                 recent 10/01/2023.  Sonographer:    Eva Lash Referring Phys: [phone] PETER E BABCOCK IMPRESSIONS  1. Limited for afib and r/o endocarditis  2. Left ventricular ejection fraction, by estimation, is 50 to 55%. Left ventricular ejection fraction by 2D MOD biplane is 51.8 %. The left ventricle has low normal function. The left ventricle demonstrates global hypokinesis.  3. The mitral valve is degenerative. No evidence of mitral valve regurgitation. Moderate mitral annular calcification.  4. The aortic valve is tricuspid. Aortic valve regurgitation is not visualized. No aortic stenosis is present. Comparison(s): No significant change from prior study. 10/01/2023: LVEF 50-55%. Conclusion(s)/Recommendation(s): No evidence of valvular vegetations on this transthoracic echocardiogram. Consider a transesophageal echocardiogram to exclude infective endocarditis if clinically indicated. FINDINGS  Left Ventricle: Left ventricular ejection fraction, by estimation, is 50 to 55%. Left ventricular ejection fraction by 2D MOD biplane is 51.8 %. The left ventricle has low normal function. The left ventricle demonstrates global hypokinesis. Mitral Valve: The mitral valve is degenerative in appearance. Moderate mitral annular calcification. Tricuspid Valve: The  tricuspid valve is grossly normal. Tricuspid valve regurgitation is trivial. Aortic Valve: The aortic valve is tricuspid. Aortic valve regurgitation is not visualized. No aortic stenosis is present. Pulmonic Valve: The pulmonic valve was grossly normal. Pulmonic valve regurgitation is not visualized.  LV Volumes (MOD)               Biplane EF (MOD) LV vol d, MOD    67.8 ml       LV Biplane EF:   Left A2C:                                            ventricular LV vol d, MOD    90.2 ml                        ejection A4C:                                            fraction by LV vol s, MOD    30.9 ml                        2D MOD A2C:  biplane is LV vol s, MOD    47.0 ml                        51.8 %. A4C: LV SV MOD A2C:   36.9 ml LV SV MOD A4C:   90.2 ml LV SV MOD BP:    40.7 ml Vinie Maxcy MD Electronically signed by Vinie Maxcy MD Signature Date/Time: 10/17/2023/11:42:01 AM    Final    DG CHEST PORT 1 VIEW Result Date: 10/16/2023 CLINICAL DATA:  NG tube placement EXAM: PORTABLE CHEST 1 VIEW COMPARISON:  10/15/2023 FINDINGS: Removal of endotracheal tube. Enteric tube tip over the upper esophagus at the level of the aortic arch with tubing coiled at the level of the upper neck. Mild cardiomegaly with central vascular congestion. No consolidation, pleural effusion or pneumothorax IMPRESSION: 1. Enteric tube tip over the upper esophagus at the level of the aortic arch with tubing coiled at the level of the upper neck. Recommend repositioning. 2. Mild cardiomegaly with central vascular congestion. These results will be called to the ordering clinician or representative by the Radiologist Assistant, and communication documented in the PACS or [employer]. Electronically Signed   By: Luke Bun M.D.   On: 10/16/2023 22:43   DG Abd 1 View Result Date: 10/16/2023 CLINICAL DATA:  NG tube placement EXAM: ABDOMEN - 1 VIEW COMPARISON:  10/15/2023 FINDINGS: No enteric  tube identified on the included portions of the abdomen or pelvis. Mild air distension of the stomach. Nonobstructed gas pattern IMPRESSION: No enteric tube identified on the included portions of the abdomen or pelvis. Nonobstructed gas pattern Electronically Signed   By: Luke Bun M.D.   On: 10/16/2023 22:41   Overnight EEG with video Result Date: 10/16/2023 Shelton Arlin KIDD, MD     10/16/2023  9:56 PM Patient Name: Jayshun Galentine MRN: [id number] Epilepsy Attending: Arlin KIDD Shelton Referring Physician/Provider: Remi Pippin, NP Duration: 10/15/2023 1248 to 10/16/2023 1410 Patient history: 72yo M with right temporal ICH. EEG to evaluate for seizure Level of alertness:  comatose/ lethargic AEDs during EEG study: Propofol , LEV Technical aspects: This EEG study was done with scalp electrodes positioned according to the 10-20 International system of electrode placement. Electrical activity was reviewed with band pass filter of 1-70Hz , sensitivity of 7 uV/mm, display speed of 47mm/sec with a 60Hz  notched filter applied as appropriate. EEG data were recorded continuously and digitally stored.  Video monitoring was available and reviewed as appropriate. Description: EEG showed continuous generalized 3 to 5 Hz theta-delta slowing admixed with 1-3 seconds of generalized attenuation. Hyperventilation and photic stimulation were not performed.   ABNORMALITY - Continuous slow, generalized IMPRESSION: This study is suggestive of severe diffuse encephalopathy. No seizures or epileptiform discharges were seen throughout the recording. Priyanka KIDD Shelton   CT HEAD POST STROKE FOLLOWUP/TIMED/STAT READ Result Date: 10/16/2023 CLINICAL DATA:  Code stroke. 73 year old male status post fall on blood thinners with multifocal intracranial hemorrhage. EXAM: CT HEAD WITHOUT CONTRAST TECHNIQUE: Contiguous axial images were obtained from the base of the skull through the vertex without intravenous contrast. RADIATION DOSE REDUCTION:  This exam was performed according to the departmental dose-optimization program which includes automated exposure control, adjustment of the mA and/or kV according to patient size and/or use of iterative reconstruction technique. COMPARISON:  Head CT and CTA head and neck yesterday. FINDINGS: Brain: Intra-axial hemorrhage right hemisphere tracking throughout the right temporal lobe now appears more mixed density. There are hypodense blood products suspected  anterior in the temporal lobe best seen on sagittal image 18. But comparing the hyperdense blood products T yesterday at 1430 hours the hematoma encompasses 74 by 37 x 33 mm (AP by transverse by CC), estimated hyperdense volume 45 mL and not significantly changed. Mixed density right side subdural hematoma redemonstrated, mostly hypodense and 3-4 mm in thickness at most levels. Superimposed foci of hyperdense right side SDH, and scattered bilateral SAH (right superior frontal gyrus series 5, image 53) volume not significantly changed. Basilar cisterns remain patent. Mass effect on the right lateral ventricle and leftward midline shift of 6-7 mm stable (series 2, image 16). No ventriculomegaly. No IVH. Stable gray-white differentiation. Vascular: Stable. Skull: No skull fracture identified. Sinuses/Orbits: Intubated. Similar opacification of right tympanic cavity and mastoids, complex right maxillary retention cyst again incidentally noted. Other: Intubated and oral enteric tube visible. Scalp EEG leads now in place. Orbits soft tissues appears stable and negative. IMPRESSION: 1. Multifocal intracranial hemorrhage appears stable since 1430 hours yesterday: - mixed density elongated right hemisphere Intra-axial Hematoma (note hypodense anterior component with fluid fluid level on series 2, image 23). Hyperdense component volume estimated at 45 mL and stable. - right side SDH, mostly low-density and 4 mm thickness at most levels. - scattered bilateral SAH. 2. Stable  intracranial mass effect. 6-7 mm leftward midline shift. No IVH or ventricular trapping. Basilar cisterns remain patent. 3. No skull fracture identified. No new intracranial abnormality identified. Electronically Signed   By: VEAR Hurst M.D.   On: 10/16/2023 05:18   DG Abd Portable 1V Result Date: 10/15/2023 CLINICAL DATA:  OG tube placement EXAM: PORTABLE ABDOMEN - 1 VIEW COMPARISON:  CT 10/15/2023 FINDINGS: Enteric tube tip and side port overlie proximal stomach. Excreted contrast in the renal collecting systems and ureter. IMPRESSION: Enteric tube tip and side port overlie proximal stomach Electronically Signed   By: Luke Bun M.D.   On: 10/15/2023 15:52   CT HEAD POST STROKE FOLLOWUP/TIMED/STAT READ Result Date: 10/15/2023 EXAM: CT HEAD WITHOUT CONTRAST 10/15/2023 02:32:49 PM TECHNIQUE: CT of the head was performed without the administration of intravenous contrast. Automated exposure control, iterative reconstruction, and/or weight based adjustment of the mA/kV was utilized to reduce the radiation dose to as low as reasonably achievable. COMPARISON: CT head without contrast 10/15/2023 at 2:29 am. CLINICAL HISTORY: Neuro deficit, acute, stroke suspected. FINDINGS: BRAIN AND VENTRICLES: The right temporoparietal parenchymal hemorrhage is not significantly changed in size. The hyperdense component measures 7.5 x 3.3 x 3.7 cm. Surrounding vasogenic edema and mass effect are similar to the prior study. Midline shift is 6 mm. Scattered subarachnoid hemorrhage is similar to the prior study. No new hemorrhage is present. ORBITS: Bilateral lens replacements are noted. The globes and orbits are otherwise within normal limits. SINUSES: An ossified cystic structure in the right maxillary sinus corresponds to an inverted unerupted tooth and represents a dentigerous cyst. The paranasal sinuses and mastoid air cells are otherwise unremarkable. SOFT TISSUES AND SKULL: No acute abnormality of the visualized skull or soft  tissues. IMPRESSION: 1. Right temporoparietal parenchymal hemorrhage with surrounding vasogenic edema and mass effect, not significantly changed in size compared to the prior study. Midline shift is 6 mm. 2. Scattered subarachnoid hemorrhage, similar to the prior study. No new hemorrhage is present. Electronically signed by: Lonni Necessary MD 10/15/2023 02:52 PM EDT RP Workstation: [id number]   DG Foot 2 Views Right Result Date: 10/15/2023 CLINICAL DATA:  Blunt trauma EXAM: RIGHT FOOT - 2 VIEW COMPARISON:  07/15/2019 FINDINGS:  Frontal and cross-table lateral views of the right foot are obtained. Prior amputation of the first digit at the level of the base of the first proximal phalanx. There are no acute displaced fractures. Flexion deformities of the second through fourth digits. Mild diffuse joint space narrowing greatest at the first metatarsophalangeal joint and throughout the interphalangeal joints. Atherosclerosis. Soft tissues are unremarkable. IMPRESSION: 1. Mild osteoarthritis. 2. No acute displaced fracture. 3. Previous first digit amputation. Electronically Signed   By: Ozell Daring M.D.   On: 10/15/2023 14:38   CT ANGIO HEAD NECK W WO CM Result Date: 10/15/2023 CLINICAL DATA:  73 year old male status post fall on blood thinners with acute intracranial hemorrhage. EXAM: CT ANGIOGRAPHY HEAD AND NECK WITH AND WITHOUT CONTRAST TECHNIQUE: Multidetector CT imaging of the head and neck was performed using the standard protocol during bolus administration of intravenous contrast. Multiplanar CT image reconstructions and MIPs were obtained to evaluate the vascular anatomy. Carotid stenosis measurements (when applicable) are obtained utilizing NASCET criteria, using the distal internal carotid diameter as the denominator. RADIATION DOSE REDUCTION: This exam was performed according to the departmental dose-optimization program which includes automated exposure control, adjustment of the mA and/or kV  according to patient size and/or use of iterative reconstruction technique. CONTRAST:  60mL OMNIPAQUE  IOHEXOL  350 MG/ML SOLN COMPARISON:  Head CT and cervical spine without contrast 0932 hours today. FINDINGS: CTA NECK Skeleton: Stable.  No acute osseous abnormality identified. Upper chest: Endotracheal tube tip terminates above the carina. Stable patchy and dependent left upper lobe opacity along the major fissure. Enteric tube in the visible esophagus. Other neck: Intubated and oral enteric tubes with appropriate course through the neck. Associated fluid in the pharynx. Nonvascular neck soft tissue spaces otherwise within normal limits. Aortic arch: Calcified aortic atherosclerosis.  3 vessel arch. Right carotid system: Tortuous brachiocephalic artery and right CCA without significant plaque or stenosis. Patent right carotid bifurcation with no significant plaque or stenosis. Mild mixing artifact at the bulb. Right ICA patent to the skull base. Left carotid system: Patent with mild calcified plaque at the left ICA bulb. No significant stenosis. Vertebral arteries: Tortuous proximal right subclavian artery with mild plaque, mild plaque at the right vertebral artery origin without stenosis. Dominant appearing right vertebral artery is patent and otherwise normal to the skull base. Proximal left subclavian artery soft and calcified plaque without stenosis. Normal left vertebral artery origin. Non dominant left vertebral artery. Tortuous left V1 segment. Left vertebral remains diminutive but patent to the skull base. CTA HEAD Posterior circulation: Dominant right vertebral artery supplies the basilar without stenosis. Right PICA origin is patent. Non dominant, diminutive left vertebral V4 segment is irregular and stenotic, appears to terminates in PICA. Patent basilar artery, SCA and PCA origins. Bilateral PCA branches are patent, diminutive, with mild irregularity. Mild mass effect on the right PCA branches. Anterior  circulation: Both ICA siphons are patent. Left siphon mild calcified plaque without stenosis. Right siphon mild calcified plaque without stenosis. Patent carotid termini, MCA and ACA origins. Diminutive ACA and MCA branches. Otherwise no ACA vessel abnormality. Left MCA M1 segment and bifurcation are patent. No other MCA branch abnormality. Right MCA M1 segment is patent with mass effect. Patent right MCA bifurcation. Right MCA branches appear patent with mass effect, mild irregularity. Other findings: No CTA spot sign identified. In addition to intra-axial and subarachnoid hemorrhage there is also evidence of small volume right side subdural hematoma, mixed density and estimated at 3-4 mm in thickness. Combined there  is now leftward midline shift of 5-6 mm, stable. Grossly stable right hemisphere intra-axial hematoma also. Venous sinuses: Early contrast timing, not well evaluated. Anatomic variants: Dominant right vertebral arteries flies the basilar. Review of the MIP images confirms the above findings IMPRESSION: 1. Negative for CTA spot sign, intracranial aneurysm, or evidence of vascular malformation in association with the large right hemisphere intra-axial hematoma. Large vessel occlusion, but suspected intracranial Vasospasm. 2. Furthermore, a right side Subdural Hematoma (estimated at 4 mm) is suspected in addition to the intra-axial and subarachnoid blood. Leftward midline shift of 5-6 mm does not appear significantly changed since 1033 hours. 3. 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 No other significant atherosclerotic stenosis in the head or neck. 4.  Aortic Atherosclerosis (ICD10-I70.0). Electronically Signed   By: VEAR Hurst M.D.   On: 10/15/2023 11:08   DG Pelvis Portable Result Date: 10/15/2023 CLINICAL DATA:  Trauma EXAM: PORTABLE PELVIS 1 VIEWS COMPARISON:  None Available. FINDINGS: There is no evidence of acute displaced pelvic fracture  or diastasis. Degenerative changes of the bilateral hips. No pelvic bone lesions are seen. IMPRESSION: No acute displaced pelvic fracture or diastasis. Degenerative changes of the bilateral hips. Electronically Signed   By: Limin  Xu M.D.   On: 10/15/2023 10:26   DG Chest Port 1 View Result Date: 10/15/2023 CLINICAL DATA:  Trauma EXAM: PORTABLE CHEST 1 VIEW COMPARISON:  Chest radiograph dated 07/15/2019 FINDINGS: Lines/tubes: Endotracheal tube tip projects 3.0 cm above the carina. Enteric tube tip reaches the diaphragm and terminates below the field of view. Lungs: Low lung volumes with bronchovascular crowding. No focal consolidation. Pleura: No pneumothorax or pleural effusion. Heart/mediastinum: Similar  cardiomediastinal silhouette. Bones: No acute osseous abnormality. IMPRESSION: 1. Endotracheal tube tip projects 3.0 cm above the carina. 2. Low lung volumes with bronchovascular crowding. No focal consolidation. Electronically Signed   By: Limin  Xu M.D.   On: 10/15/2023 10:25   CT CHEST ABDOMEN PELVIS W CONTRAST Result Date: 10/15/2023 CLINICAL DATA:  Fall on blood thinners.  Unresponsive. EXAM: CT CHEST, ABDOMEN, AND PELVIS WITH CONTRAST TECHNIQUE: Multidetector CT imaging of the chest, abdomen and pelvis was performed following the standard protocol during bolus administration of intravenous contrast. RADIATION DOSE REDUCTION: This exam was performed according to the departmental dose-optimization program which includes automated exposure control, adjustment of the mA and/or kV according to patient size and/or use of iterative reconstruction technique. CONTRAST:  75mL OMNIPAQUE  IOHEXOL  350 MG/ML SOLN COMPARISON:  CTA abdomen and pelvis dated 09/19/2021, CT abdomen and pelvis dated 08/19/2019 FINDINGS: CT CHEST FINDINGS Cardiovascular: Left atrial enlargement. No significant pericardial fluid/thickening. Ascending thoracic aorta measures 4.1 x 3.9 cm. No central pulmonary emboli. Mediastinum/Nodes:  Enteric tube terminates in the gastric body. Imaged thyroid  gland without nodules meeting criteria for imaging follow-up by size. Normal esophagus. No pathologically enlarged axillary, supraclavicular, mediastinal, or hilar lymph nodes. Lungs/Pleura: The central airways are patent. ET tube tip terminates 2.6 cm above the carina. Irregular consolidation/ground-glass opacity along the left major fissure. No pneumothorax. No pleural effusion. Musculoskeletal: No acute or abnormal lytic or blastic osseous lesions. Multilevel degenerative changes of the thoracic spine. CT ABDOMEN PELVIS FINDINGS Hepatobiliary: No focal hepatic lesions. No intra or extrahepatic biliary ductal dilation. Cholelithiasis. Pancreas: No focal lesions or main ductal dilation. Spleen: Normal in size without focal abnormality. Adrenals/Urinary Tract: Prior right adrenalectomy. No left adrenal nodule. Again seen are multifocal bilateral renal cysts, including cluster of cysts in the lower pole right kidney  and a few cysts in the left interpolar and lower pole demonstrating thin internal septations, which appear partially calcified. The largest cyst in the posterior left lower pole kidney has slightly increased in size measuring 6.2 x 3.4 cm (3:75), previously 4.9 x 2.4 cm. No CT findings of thick internal septations or mural nodularity. No specific follow-up imaging recommended. Subcentimeter hypodensity in the anterior interpolar right kidney (6:18) is too small to characterize. No hydronephrosis. No focal bladder wall thickening. Stomach/Bowel: Normal appearance of the stomach. No evidence of bowel wall thickening, distention, or inflammatory changes. Appendix is not discretely seen. Vascular/Lymphatic: Aortic atherosclerosis. No enlarged abdominal or pelvic lymph nodes. Reproductive: Prostate is unremarkable. Other: No free fluid, fluid collection, or free air. Sequela of prior fat necrosis in the anterior left lower abdomen. Musculoskeletal:  Severe intervertebral disc space loss at L5-S1 with associated endplate irregularity, similar dating back to 2021, likely degenerative. Small fat-containing paraumbilical hernia. Scattered sub cutaneous soft tissue subcentimeter nodular densities in the bilateral gluteal regions, likely injection related. Subcutaneous soft tissue stranding along the right hip. IMPRESSION: 1. Irregular consolidation/ground-glass opacity along the left major fissure, which may represent aspiration. 2. Subcutaneous soft tissue stranding along the right hip, which may represent soft tissue contusion in the setting of trauma. 3. No acute intra-abdominal or intrapelvic abnormality. 4. Cholelithiasis. 5. Ascending thoracic aorta measures 4.1 cm. Recommend annual imaging followup by CTA or MRA. This recommendation follows 2010 ACCF/AHA/AATS/ACR/ASA/SCA/SCAI/SIR/STS/SVM Guidelines for the Diagnosis and Management of Patients with Thoracic Aortic Disease. Circulation. 2010; 121: [id number]. Aortic aneurysm NOS (ICD10-I71.9) 6.  Aortic Atherosclerosis (ICD10-I70.0). Electronically Signed   By: Limin  Xu M.D.   On: 10/15/2023 10:22   CT CERVICAL SPINE WO CONTRAST Result Date: 10/15/2023 CLINICAL DATA:  Polytrauma, blunt EXAM: CT CERVICAL SPINE WITHOUT CONTRAST TECHNIQUE: Multidetector CT imaging of the cervical spine was performed without intravenous contrast. Multiplanar CT image reconstructions were also generated. RADIATION DOSE REDUCTION: This exam was performed according to the departmental dose-optimization program which includes automated exposure control, adjustment of the mA and/or kV according to patient size and/or use of iterative reconstruction technique. COMPARISON:  None Available. FINDINGS: Alignment: Anatomic. Skull base and vertebrae: Intact.  No bony lesions present. Soft tissues and spinal canal: The paraspinous soft tissues demonstrate no evidence of hematoma or soft tissue injury. Disc levels: Bulging disc osteophyte  complexes are present at C3-4, C4-5, C5-6 and C6-7. There is moderate to severe central spinal canal stenosis at C5-6, slightly worse on the right. There is moderate central spinal canal stenosis at C3-4, C4-5 and C6-7. Upper chest: Unremarkable. Other: Oral tracheal and orogastric tubes are noted. IMPRESSION: 1. Moderate multilevel chronic degenerative disc disease. No evidence of acute traumatic injury. 2. These results were called by telephone at the time of interpretation on 10/15/2023 at 10:02 am to provider Dr. Sebastian, who verbally acknowledged these results. Electronically Signed   By: Evalene Coho M.D.   On: 10/15/2023 10:07   CT HEAD WO CONTRAST Result Date: 10/15/2023 CLINICAL DATA:  Head trauma, moderate-severe EXAM: CT HEAD WITHOUT CONTRAST TECHNIQUE: Contiguous axial images were obtained from the base of the skull through the vertex without intravenous contrast. RADIATION DOSE REDUCTION: This exam was performed according to the departmental dose-optimization program which includes automated exposure control, adjustment of the mA and/or kV according to patient size and/or use of iterative reconstruction technique. COMPARISON:  None Available. FINDINGS: Brain: There is an acute intraparenchymal hemorrhage present within the right lateral temporal lobe, measuring approximately 6.1 cm in  AP diameter, 3.5 cm in transverse diameter and 3.2 cm in craniocaudad length. There is surrounding vasogenic edema. There is also adjacent subarachnoid hemorrhage. There is mass effect with shift of the midline structures to the left by approximately 5 mm. Vascular: Mild calcific atheromatous disease.  No hyperdense artery. Skull: Intact and unremarkable. Sinuses/Orbits: Polypoid mucosal lesion within the right maxillary sinus associated with an unerupted molar. Status post bilateral lens replacement. Other: Oral tracheal and orogastric tubes are present. IMPRESSION: 1. Acute intraparenchymal hematoma within the  right lateral temporal lobe with surrounding vasogenic edema and mass effect, with shift of the midline structures to the left by approximately 5 mm. 2. Scattered subarachnoid hemorrhage, primarily within the adjacent cerebral sulci and sylvian fissure. These results were called by telephone at the time of interpretation on 10/15/2023 at 9:47 am to provider Dr. Sebastian, who verbally acknowledged these results. Electronically Signed   By: Evalene Coho M.D.   On: 10/15/2023 09:59   ECHOCARDIOGRAM COMPLETE Result Date: 10/01/2023    ECHOCARDIOGRAM REPORT   Patient Name:   LAMBERTO DINAPOLI Date of Exam: 10/01/2023 Medical Rec #:  [id number]         Height:       72.0 in Accession #:    [id number]        Weight:       213.0 lb Date of Birth:  19-Nov-1950        BSA:          2.188 m Patient Age:    72 years          BP:           121/81 mmHg Patient Gender: M                 HR:           58 bpm. Exam Location:  Outpatient Procedure: 2D Echo, 3D Echo, Cardiac Doppler and Color Doppler (Both Spectral            and Color Flow Doppler were utilized during procedure). Indications:    Atrial Fibrillation  History:        Patient has prior history of Echocardiogram examinations, most                 recent 05/30/2020. PAD, Arrythmias:Atrial Fibrillation; Risk                 Factors:Hypertension, Former Smoker, Dyslipidemia and Diabetes.  Sonographer:    Orvil Holmes RDCS Referring Phys: [id number] JULIE ANNE WILLIAMS IMPRESSIONS  1. Left ventricular ejection fraction, by estimation, is 50 to 55%. The left ventricle has low normal function. The left ventricle demonstrates global hypokinesis. Left ventricular diastolic function could not be evaluated.  2. Right ventricular systolic function mild to moderately reduced. The right ventricular size is normal. Mildly increased right ventricular wall thickness.  3. The mitral valve is degenerative. Trivial mitral valve regurgitation. No evidence of mitral stenosis.  4. The aortic  valve is tricuspid. Aortic valve regurgitation is not visualized. Aortic valve sclerosis is present, with no evidence of aortic valve stenosis.  5. Aortic dilatation noted. There is dilatation of the ascending aorta, measuring 40 mm. There is dilatation of the aortic root, measuring 40 mm.  6. The inferior vena cava is normal in size with greater than 50% respiratory variability, suggesting right atrial pressure of 3 mmHg. Comparison(s): A prior study was performed on 05/30/2020. No significant change from prior study. FINDINGS  Left Ventricle: Left ventricular  ejection fraction, by estimation, is 50 to 55%. The left ventricle has low normal function. The left ventricle demonstrates global hypokinesis. Strain was performed and the global longitudinal strain is indeterminate. 3D  ejection fraction reviewed and evaluated as part of the interpretation. Alternate measurement of EF is felt to be most reflective of LV function. The left ventricular internal cavity size was normal in size. There is no left ventricular hypertrophy of the basal-septal segment. Left ventricular diastolic function could not be evaluated due to atrial fibrillation. Left ventricular diastolic function could not be evaluated. Right Ventricle: The right ventricular size is normal. Mildly increased right ventricular wall thickness. Right ventricular systolic function mild to moderately reduced. Left Atrium: Left atrial size was normal in size. Right Atrium: Right atrial size was normal in size. Pericardium: There is no evidence of pericardial effusion. Mitral Valve: The mitral valve is degenerative in appearance. Trivial mitral valve regurgitation. No evidence of mitral valve stenosis. Tricuspid Valve: The tricuspid valve is grossly normal. Tricuspid valve regurgitation is not demonstrated. No evidence of tricuspid stenosis. Aortic Valve: The aortic valve is tricuspid. Aortic valve regurgitation is not visualized. Aortic valve sclerosis is present,  with no evidence of aortic valve stenosis. Pulmonic Valve: The pulmonic valve was grossly normal. Pulmonic valve regurgitation is not visualized. No evidence of pulmonic stenosis. Aorta: Aortic dilatation noted. There is dilatation of the ascending aorta, measuring 40 mm. There is dilatation of the aortic root, measuring 40 mm. Venous: The inferior vena cava is normal in size with greater than 50% respiratory variability, suggesting right atrial pressure of 3 mmHg. IAS/Shunts: The atrial septum is grossly normal.  LEFT VENTRICLE PLAX 2D LVIDd:         4.12 cm   Diastology LVIDs:         3.06 cm   LV e' medial:    6.74 cm/s LV PW:         1.34 cm   LV E/e' medial:  14.1 LV IVS:        0.96 cm   LV e' lateral:   8.81 cm/s LVOT diam:     2.20 cm   LV E/e' lateral: 10.8 LV SV:         41 LV SV Index:   19 LVOT Area:     3.80 cm                           3D Volume EF:                          3D EF:        75 %                          LV EDV:       152 ml                          LV ESV:       38 ml                          LV SV:        114 ml RIGHT VENTRICLE RV Basal diam:  3.96 cm RV Mid diam:    3.09 cm RV S prime:     8.27 cm/s TAPSE (M-mode): 1.1 cm LEFT ATRIUM  Index        RIGHT ATRIUM           Index LA diam:        5.10 cm 2.33 cm/m   RA Area:     20.40 cm LA Vol (A2C):   64.7 ml 29.57 ml/m  RA Volume:   57.50 ml  26.28 ml/m LA Vol (A4C):   64.5 ml 29.47 ml/m LA Biplane Vol: 69.1 ml 31.58 ml/m  AORTIC VALVE LVOT Vmax:   50.60 cm/s LVOT Vmean:  33.200 cm/s LVOT VTI:    0.108 m  AORTA Ao Root diam: 4.00 cm Ao Asc diam:  4.00 cm MITRAL VALVE MV Area (PHT): 4.49 cm    SHUNTS MV Decel Time: 169 msec    Systemic VTI:  0.11 m MV E velocity: 94.90 cm/s  Systemic Diam: 2.20 cm MV A velocity: 41.90 cm/s MV E/A ratio:  2.26 Sunit Tolia Electronically signed by Madonna Large Signature Date/Time: 10/01/2023/5:38:39 PM    Final     Microbiology Recent Results (from the past 240 hours)  Culture,  Respiratory w Gram Stain     Status: None   Collection Time: 10/20/23  9:26 AM   Specimen: Tracheal Aspirate; Respiratory  Result Value Ref Range Status   Specimen Description TRACHEAL ASPIRATE  Final   Special Requests NONE  Final   Gram Stain   Final    ABUNDANT SQUAMOUS EPITHELIAL CELLS PRESENT MODERATE WBC PRESENT, PREDOMINANTLY PMN RARE BUDDING YEAST SEEN RARE GRAM POSITIVE COCCI IN PAIRS Performed at [facility], [address], [place], [place] [address]    Culture FEW CANDIDA TROPICALIS  Final   Report Status 10/22/2023 FINAL  Final  Culture, BAL-quantitative w Gram Stain     Status: Abnormal   Collection Time: 10/21/23 10:03 AM   Specimen: Bronchoalveolar Lavage; Respiratory  Result Value Ref Range Status   Specimen Description BRONCHIAL ALVEOLAR LAVAGE  Final   Special Requests NONE  Final   Gram Stain   Final    RARE WBC PRESENT, PREDOMINANTLY PMN NO ORGANISMS SEEN Performed at [facility], [address], [place], [place] [address]    Culture 20,000 COLONIES/mL CANDIDA TROPICALIS (A)  Final   Report Status 10/24/2023 FINAL  Final  Aerobic/Anaerobic Culture w Gram Stain (surgical/deep wound)     Status: None   Collection Time: 10/23/23  1:12 PM   Specimen: PATH Digit amputation; Tissue  Result Value Ref Range Status   Specimen Description TISSUE  Final   Special Requests RIGHT 2ND TOE  Final   Gram Stain   Final    RARE WBC PRESENT, PREDOMINANTLY PMN NO ORGANISMS SEEN    Culture   Final    RARE STAPHYLOCOCCUS HAEMOLYTICUS RARE ALCALIGENES FAECALIS NO ANAEROBES ISOLATED Performed at [facility], [address], [place], [place] [address]    Report Status 10/28/2023 FINAL  Final   Organism ID, Bacteria STAPHYLOCOCCUS HAEMOLYTICUS  Final   Organism ID, Bacteria ALCALIGENES FAECALIS  Final      Susceptibility   Alcaligenes faecalis - MIC*    CEFEPIME 0.5 SENSITIVE Sensitive     GENTAMICIN <=1 SENSITIVE Sensitive     CIPROFLOXACIN  0.5  SENSITIVE Sensitive     IMIPENEM <=0.25 SENSITIVE Sensitive     TRIMETH/SULFA 40 SENSITIVE Sensitive     * RARE ALCALIGENES FAECALIS   Staphylococcus haemolyticus - MIC*    CIPROFLOXACIN  >=8 RESISTANT Resistant     ERYTHROMYCIN >=8 RESISTANT Resistant  GENTAMICIN <=0.5 SENSITIVE Sensitive     OXACILLIN >=4 RESISTANT Resistant     TETRACYCLINE <=1 SENSITIVE Sensitive     VANCOMYCIN  <=0.5 SENSITIVE Sensitive     TRIMETH/SULFA <=10 SENSITIVE Sensitive     CLINDAMYCIN >=8 RESISTANT Resistant     RIFAMPIN <=0.5 SENSITIVE Sensitive     Inducible Clindamycin NEGATIVE Sensitive     * RARE STAPHYLOCOCCUS HAEMOLYTICUS  Culture, Respiratory w Gram Stain     Status: None   Collection Time: 10/27/23  5:12 PM   Specimen: Tracheal Aspirate; Respiratory  Result Value Ref Range Status   Specimen Description TRACHEAL ASPIRATE  Final   Special Requests NONE  Final   Gram Stain   Final    RARE WBC PRESENT, PREDOMINANTLY PMN RARE BUDDING YEAST SEEN Performed at [facility], [address] [place], [place] [address]    Culture FEW CANDIDA TROPICALIS  Final   Report Status 2023-11-09 FINAL  Final    Lab Basic Metabolic Panel: Recent Labs  Lab 10/23/23 0631 10/24/23 0600 10/25/23 0500 10/26/23 0557 10/27/23 0630 10/27/23 0948 10/28/23 0509 10/28/23 1028 10/28/23 1825 2023/11/09 0011 November 09, 2023 0526  NA 147* 145 144 143  --    < > 151* 153* 157* 158* 158*  K 4.0 4.0 3.6 3.5  --   --   --  4.2  --   --   --   CL 109 111 110 109  --   --   --  123*  --   --   --   CO2 26 25 26 24   --   --   --  23  --   --   --   GLUCOSE 282* 216* 232* 197*  --   --   --  191*  --   --   --   BUN 32* 36* 34* 38*  --   --   --  38*  --   --   --   CREATININE 1.57* 1.67* 1.53* 1.56*  --   --   --  1.38*  --   --   --   CALCIUM  9.4 9.5 9.5 9.4  --   --   --  9.2  --   --   --   MG 1.9 1.9 1.9 2.0 1.9  --  2.0  --   --   --  1.9   < > = values in this interval not displayed.   Liver Function  Tests: No results for input(s): AST, ALT, ALKPHOS, BILITOT, PROT, ALBUMIN  in the last 168 hours. No results for input(s): LIPASE, AMYLASE in the last 168 hours. Recent Labs  Lab 10/27/23 0630  AMMONIA 23   CBC: Recent Labs  Lab 10/23/23 0631 10/24/23 0600 10/25/23 0500 10/26/23 0557 10/28/23 1028  WBC 5.4 5.7 5.5 4.8 4.8  HGB 13.7 13.2 13.0 12.2* 11.8*  HCT 42.2 41.2 40.9 38.2* 36.3*  MCV 95.9 94.9 95.1 95.5 95.3  PLT 80* 84* 82* 81* 101*   Cardiac Enzymes: No results for input(s): CKTOTAL, CKMB, CKMBINDEX, TROPONINI in the last 168 hours. Sepsis Labs: Recent Labs  Lab 10/24/23 0600 10/25/23 0500 10/26/23 0557 10/28/23 1028  WBC 5.7 5.5 4.8 4.8    Procedures/Operations  amputation   Leeya Rusconi November 09, 2023, 4:29 PM

## 2023-11-27 NOTE — Progress Notes (Signed)
 Midnight sodium check resulted as 158, 3% running at 58ml/hr. Neurology made aware, verbal order to stop 3% for now until next sodium check.

## 2023-11-27 NOTE — Progress Notes (Signed)
 Agree with the documentation  Deep Tissue Pressure injury of right buttocks, present on admission (at the time of the admission order)  Pressure injury  of right elbow, not present on admission (developed during hospital admission)

## 2023-11-27 NOTE — Progress Notes (Signed)
 PT Cancellation Note  Patient Details Name: Duayne Brideau MRN: [id number] DOB: 1951-01-16   Cancelled Treatment:    Reason Eval/Treat Not Completed: PT screened, no needs identified, will sign off  Noted no significant improvement/changes since starting ritalin. Family leaning towards comfort care. Will sign off at this time.    Macario RAMAN, PT Acute Rehabilitation Services  Office [phone]  Macario SHAUNNA Soja 24-Nov-2023, 8:24 AM

## 2023-11-27 NOTE — Progress Notes (Signed)
   Nov 10, 2023 1500  Spiritual Encounters  Type of Visit Initial  Care provided to: Family  Referral source Nurse (RN/NT/LPN)  Reason for visit End-of-life    Chaplain responded to consult request for end of life. The patient passed away before chaplain's presence. Chaplain had a brief conversation with his daughter and offered condolences.    M.Kubra Susanna Kerry Resident [phone]

## 2023-11-27 NOTE — Progress Notes (Signed)
 Pt compassionately extubated per MD order and family request.

## 2023-11-27 NOTE — Progress Notes (Signed)
 STROKE TEAM PROGRESS NOTE    SIGNIFICANT HOSPITAL EVENTS  6/19: level 2 trauma after being found on the ground incontinent and shaking.  HR 200s with wide complex QRS, amio bolus given and drip started.  GCS 6. Intubated on arrival.  CT Head shows an acute intraparenchymal hematoma within the right lateral temporal lobe with surrounding vasogenic edema and 5 mm midline shift and scattered subarachnoid hemorrhages.  PTA xarelto  reversed with [facility] on Admission 22. 6/20: Repeat CT head early this a.m. stable. Extubated in afternoon. 6/21: MRI stable IPH, multiple acute/subacute cortical infarcts seen. 3% stopped.  6/23- re intubated 6/25-found to have osteomyelitis of right second toe, amputation planned for Friday, bronchoscopy performed and cortrak inserted  INTERIM HISTORY/SUBJECTIVE Patient is seen in his room with his wife and daughters at the bedside.  Patient remains intubated.   Patient eyes are   open but he is globally aphasic and not following commands.  He has some purposeful movements on the right when stimulated but remains plegic on the left.  He is on hypertonic saline serum sodium is 158 this morning.  He is not following commands or moving extremities purposefully.  Wife and daughters are leaning towards withdrawal of care as they feel patient would not want to live the life of significant disability which seems unavoidable..    OBJECTIVE  CBC    Component Value Date/Time   WBC 4.8 10/28/2023 1028   RBC 3.81 (L) 10/28/2023 1028   HGB 11.8 (L) 10/28/2023 1028   HGB 17.4 08/12/2023 1622   HCT 36.3 (L) 10/28/2023 1028   HCT 53.7 (H) 08/12/2023 1622   PLT 101 (L) 10/28/2023 1028   PLT 127 (L) 08/12/2023 1622   MCV 95.3 10/28/2023 1028   MCV 93 08/12/2023 1622   MCH 31.0 10/28/2023 1028   MCHC 32.5 10/28/2023 1028   RDW 17.2 (H) 10/28/2023 1028   RDW 13.1 08/12/2023 1622   LYMPHSABS 0.9 08/31/2023 1632   LYMPHSABS 0.8 08/04/2022 1459   MONOABS 0.3 08/31/2023  1632   EOSABS 0.0 08/31/2023 1632   EOSABS 0.1 08/04/2022 1459   BASOSABS 0.0 08/31/2023 1632   BASOSABS 0.0 08/04/2022 1459    BMET    Component Value Date/Time   NA 158 (H) 2023/10/30 0526   NA 138 08/31/2023 1632   K 4.2 10/28/2023 1028   CL 123 (H) 10/28/2023 1028   CO2 23 10/28/2023 1028   GLUCOSE 191 (H) 10/28/2023 1028   BUN 38 (H) 10/28/2023 1028   BUN 15 08/31/2023 1632   CREATININE 1.38 (H) 10/28/2023 1028   CALCIUM  9.2 10/28/2023 1028   EGFR 51 (L) 08/31/2023 1632   GFRNONAA 54 (L) 10/28/2023 1028    IMAGING past 24 hours No results found.    Vitals:   October 30, 2023 0908 Oct 30, 2023 1000 2023-10-30 1100 2023-10-30 1200  BP:  (!) 155/86 (!) 140/94 (!) 141/94  Pulse:  (!) 103 96 90  Resp:  16 (!) 22 19  Temp:  99.1 F (37.3 C) 99.3 F (37.4 C) 99.3 F (37.4 C)  TempSrc:      SpO2: 100% 99% 98% 96%  Weight:      Height:        PHYSICAL EXAM General: Elderly male, acutely ill CV: paroxysmal Afib on monitor Respiratory: Intubated, mechanically ventilated on full support   NEURO: (On no sedation) patient is lying with eyes closed.  He is unresponsive.  Eyes are partially open but does not follow commands.  Pupils equal  round and reactive, opens eyes to voice but does not track examiner, blinks to threat bilaterally, does not follow commands or respond to noxious stimuli  Temp:  [98.6 F (37 C)-99.5 F (37.5 C)] 99.3 F (37.4 C) 11/19/23 1200) Pulse Rate:  [84-113] 90 2023-11-19 1200) Resp:  [16-22] 19 November 19, 2023 1200) BP: (132-155)/(70-102) 141/94 11-19-2023 1200) SpO2:  [96 %-100 %] 96 % 11-19-23 1200) FiO2 (%):  [40 %] 40 % 2023-11-19 0908) Weight:  [102 kg] 102 kg 19-Nov-2023 0500)   Most Recent NIH: 21     ASSESSMENT/PLAN  Mr. Cameran Pettey is a 73 y.o. male with  73 y.o. male with hx of anemia, afib on xarleto, anxiety, CKD stage 3, DM without complication, HLD, HTN, Hypothyroidism, PUD presenting as a level 2 trauma after being found on the ground incontinent and  shaking. He was given 5mg  of versed  by EMS due to combativeness. He is also on doxycycline  for a toe infection. HR 200s with wide complex QRS, amio bolus given and drip started. GCS 6. Intubated on arrival. CT Head shows an acute intraparenchymal hematoma within the right lateral temporal lobe with surrounding vasogenic edema and 5 mm midline shift and scattered subarachnoid hemorrhages. PTA xarelto  reversed with [facility] on Admission 22.  ICH:  right temporoparietal ICH with scattered SAH, etiology: hypertensive in the setting of DOAC CT Head Acute IPH within right lateral temporal lobe with surrounding vasogenic edema and mass effect, midline shift of approximately 5 mm to left. Scattered subarachnoid hemorrhage CTA head & neck Negative for spot sign, intracranial aneurysm, or evidence of vascular malformation. Right sided subdural hematoma (estimated at 4 mm) suspected in addition to intra-axial and subarachnoid blood. Repeat CT head 6/20 early AM: overall stable Right temporoparietal parenchymal hemorrhage with surrounding vasogenic edema and mass effect, not significantly changed.  Midline shift is now 6 mm. Scattered subarachnoid hemorrhage, similar to prior. Status post [facility]  reversal MRI  with and without contrast: Stable right temporoparietal IPH and associated vasogenic edema. Mass effect and midline shift similar to prior study, measuring 6 mm. Multiple punctate acute/subacute bilateral cortical infarcts Repeat MRI brain 10/27/2023 stable appearance of the large right temporal lobar hematoma with cytotoxic edema and 6 mm right-to-left shift 6/23- CT Head- Stable hematoma, with MLS 7mm CT repeat 6/26 right temporal IPH similar in size with vasogenic edema and 6 mm midline shift, subarachnoid hemorrhage less prominent  MRI repeat 11/15/2022 shows stable appearance of the large right temporal lobar hemorrhage with 6 mm leftward midline shift.  No new findings 2D Echo: EF 50 to 55%, global  hypokinesis, degenerative mitral valve LDL 54 in 08/2023 HgbA1c 5.6 in 08/2023 VTE prophylaxis - SCDs Xarelto  (rivaroxaban ) daily prior to admission, continue No antithrombotic due to ICH Therapy recommendations:  CIR Disposition: Pending  Cerebral Edema Repeat CT 6/23 overall stable hematoma, and slight increase cerebral edema with MLS 7mm CT repeat 6/26 demonstrates stable IPH NS consulted, no surgical intervention at this time. 3% bolus of 250 given 6/19 3% hypertonic saline stopped 6/21 -> on NS @ 50-> off Na 138-134-136-144-150-148-154-151 -149- [phone] Allow Na gradually trending down  Seizure like activity Incontinence with shaking at onset per report No seizure since admission LTM EEG no seizure-like activity seen, discontinued 6/20 S/p keppra  load Keppra  7 days course completed  Acute Respiratory Failure ?Aspiration Pneumonia Intubated 6/19 d/t mental status, inability to protect airway Extubated 6/20 afternoon -> 6L [place] -> 15L [facility]->re-intubated 6/23 B Cx showed 1/4 steph hemolyticus, concerning for contamination,  repeat blood cultures NGTD WBC 4.6->5.7->5.8->5.4 CXR with New right perihilar airspace disease S/p bronch 6/25, no mucous plugs On unasyn    Atrial fibrillation with RVR Home Meds: Xarelto  20 mg daily Continue telemetry monitoring 6/26 overnight RVR 150s received amio bolus, now off amio IV No anticoagulation now due to ICH  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 N/A TOC consult for cessation placed  Dysphagia Patient has post-stroke dysphagia, SLP consulted Now n.p.o. On TF @ 55 -> off 6/27 for OR  Other Stroke Risk Factors Obesity, Body mass index is 30.5 kg/m., BMI >/= 30 associated with increased stroke risk, recommend weight loss, diet and exercise as appropriate Former smoker  Other Active Problems CKD3b, creatinine 1.50--1.35--1.58--1.50--1.38--1.36--1.58-1.57, on TF  Hospital day # 14 Patient seen and examined by NP/APP with MD. MD to update note as needed.   Patient is 14 days out from his hemorrhage and repeat imaging shows expected evolutionary changes and persistent 6 mm right-to-left shift but patient neurological exam and mental status seem out of proportion without obvious explanation.  Patient had received phenobarbital  alcohol withdrawal protocol and it has been 8 days since last dose.  He is also afebrile with no signs of infection.  EEG also shows no seizure activity.   repeat trial of hypertonic saline has promoted more wakefulness but he is neurological exam is not much improved and he still not following commands and remains quadriplegic.  Prognosis very poor patient unlikely to survive without prolonged ventilatory support, tracheostomy and PEG tube and prolonged stay in nursing home which he would not have wanted.  Patient's wife and daughters are all in agreement that patient would not want to live a life of such severe disability and agree to make him DNR and full comfort care measures.  He will be Will Be Extubated after Family Has [employer]SABRA Law discussion with PCCM MD and PA.   I    This patient is critically ill and at significant risk of neurological worsening, death and care requires constant monitoring of vital signs, hemodynamics,respiratory and cardiac monitoring, extensive review of multiple databases, frequent neurological assessment,  discussion with family, other specialists and medical decision making of high complexity.I have made any additions or clarifications directly to the above note.This critical care time does not reflect procedure time, or teaching time or supervisory time of PA/NP/Med Resident etc but could involve care discussion time.  I spent 30 minutes of neurocritical care time  in the care of  this patient.            Eather Popp, MD

## 2023-11-27 NOTE — Progress Notes (Signed)
 Patient comfort care. TOD 1530 pronounced by this RN and Marissa Elizondo RN. Family at bedside. ME aware.

## 2023-11-27 NOTE — Progress Notes (Signed)
 NAME:  Deakon Frix, MRN:  [id number], DOB:  1950/11/29, LOS: 14 ADMISSION DATE:  10/15/2023, CONSULTATION DATE: 10/15/2023 REFERRING MD: EDP, CHIEF COMPLAINT: Intracerebral hemorrhage  History of Present Illness:  73 year old man who was found down by wife poorly responsive required intubation and transportation to the emergency.  CT scan revealed a right intraparenchymal hemorrhage with other areas of injury.  He is currently intubated in atrial fibrillation ventricular response on amiodarone  drip being treated with fentanyl  and propofol  for tube tolerance.  He has been seen by neurosurgery and neurology.  Hyperatremia treatment we await the final results of CT scan of the head to see if there is any surgical options.  Pertinent Medical History:   Past Medical History:  Diagnosis Date   Anemia due to blood loss, acute 2014   2nd GIB   Anxiety    CKD (chronic kidney disease), stage III (HCC) 2014   Diabetes mellitus without complication (HCC)    Gout    High cholesterol    Hypertension 2014   Hypothyroid    Neuropathy    PUD (peptic ulcer disease) 2014    Significant Hospital Events: Including procedures, antibiotic start and stop dates in addition to other pertinent events   6/19 Admitted w/ [facility] felt 2/2 HTN and c/b SDH p/ fall. Started AEDs. HTNS. Seen by n surg felt unlikely that surg would improve outcome. Seen by neuro. Placed on clevi. Got AC reversed in ER. Unasyn  started for asp 6/20 weaning CT imaging stable ECHO ordered. 1/2 BC + felt contamination but does bring endocarditis into question. Extubated with increased agitation post-extubation, placed on precedex . Patient does drink alcohol every night, placed on CIWA 6/21 Echo with EF 50-55%, low normal LV function +global hypokinesis, degenerative MV without MR, no evidence of valvular vegetations. 6/23: reintubated after poor responsiveness, gurgling and desaturations 6/24: MRI today for R foot osteo r/o  6/25: MRI  yesterday with osteo - adding linezolid . Will consult podiatry. Bronch completed. Waking up.  6/26: SBT with apnea. ABI today for poss amputation R toe 6/27. Amio  6/28 SBT with apnea 6/29: no change, started on modafinil.  6/30: brain stem reflexes present. Put on sbt, will let him ride.  7/1: no changes in alertness 7/2 more awake, but became apneic on sbt.  7/3 more awake, not moving but tracking.   Interim History / Subjective:   No overnight events More awake, not following commands, doesn't mvoe  Objective:   Blood pressure (!) 145/83, pulse (!) 102, temperature 98.8 F (37.1 C), resp. rate 19, height 6' (1.829 m), weight 102 kg, SpO2 100%.    Vent Mode: PRVC FiO2 (%):  [40 %] 40 % Set Rate:  [16 bmp] 16 bmp Vt Set:  [620 mL] 620 mL PEEP:  [5 cmH20] 5 cmH20 Plateau Pressure:  [16 cmH20-17 cmH20] 16 cmH20   Intake/Output Summary (Last 24 hours) at 11-14-2023 0833 Last data filed at 11-14-2023 0700 Gross per 24 hour  Intake 2538.42 ml  Output 2565 ml  Net -26.58 ml   Filed Weights   10/27/23 0500 10/28/23 0500 2023-11-14 0500  Weight: 101 kg 102.1 kg 102 kg   Physical Examination: General: non responsive off sedation. HEENT: Moist oral mucosa, endotracheal tube in place Neuro: Not following commands, cough +, weak gag, corneal +,  CV: S1-S2 appreciated, irregular PULM: Clear breath sounds soft HP:dnqu  Extremities: Right foot and postsurgical dressings  I reviewed last 24 h vitals and pain scores, last 48 h intake and  output, last 24 h labs and trends, and last 24 h imaging results.  Resolved problem List:   Assessment and Plan:   Right intraparenchymal hemorrhage with brain compression Scattered subarachnoid hemorrhages - On antiepileptic drugs - Continue supportive therapies - Stroke team continues to follow - EEG performed 6/28-no evidence of seizures - MRI brain with persistent edema - restarted on HTS - Has been off sedative medications - b12, ammonia,  tsh normal - modafinil now switched to ritalin - unfortunately has not improved from a standpoint of mentation, multiple discussions done, family reports patient wouldn't want prolonged life support.    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 - As needed hydralazine , labetalol  - Scheduled metoprolol  25 bid  Chronic kidney disease - Follow BMP - Avoid nephrotoxic medications  EtOH use history - Continue thiamine , folate  Diabetes - Continue SSI  Is DNR status    Best Practice (right click and Reselect all SmartList Selections daily)   Diet/type: tubefeeds DVT prophylaxis: SCD, heparin  Pressure ulcer(s): present on admission  GI prophylaxis: PPI Lines: N/A Foley:  Yes, and it is still needed Code Status:  DNR Last date of multidisciplinary goals of care discussion: [Bedside 6/28 with spouse at bedside ]  The patient is critically ill with multiple organ systems failure and requires high complexity decision making for assessment and support, frequent evaluation and titration of therapies, application of advanced monitoring technologies and extensive interpretation of multiple databases. Critical Care Time devoted to patient care services described in this note independent of APP/resident time (if applicable)  is 35 minutes.   Kevin Legions MD [facility] CCM On-call: #[phone]

## 2023-11-27 NOTE — Progress Notes (Signed)
 OT Cancellation Note  Patient Details Name: Makayla Confer MRN: [id number] DOB: [birth year]   Cancelled Treatment:    Reason Eval/Treat Not Completed: OT screened, no needs identified, will sign off- Noted no significant improvement/changes since starting ritalin. Family leaning towards comfort care. Will sign off at this time. If further needs arise, please re-consult.   Etta NOVAK, OT Acute Rehabilitation Services Office [phone] Secure Chat Preferred    Etta GORMAN [place] November 13, 2023, 9:15 AM

## 2023-11-27 NOTE — Progress Notes (Signed)
 25cc of Dilaudid  wasted with Madelin Manila RN in steri cycle in med room.

## 2023-11-27 DEATH — deceased

## 2024-01-13 ENCOUNTER — Encounter
